# Patient Record
Sex: Female | Born: 1965
Health system: Southern US, Community
[De-identification: ages and names within clinical notes are randomized; demographics above are authoritative.]

## PROBLEM LIST (undated history)

## (undated) DIAGNOSIS — I10 Essential (primary) hypertension: Secondary | ICD-10-CM

## (undated) DIAGNOSIS — Z5189 Encounter for other specified aftercare: Secondary | ICD-10-CM

## (undated) DIAGNOSIS — N83209 Unspecified ovarian cyst, unspecified side: Secondary | ICD-10-CM

## (undated) DIAGNOSIS — D689 Coagulation defect, unspecified: Secondary | ICD-10-CM

## (undated) DIAGNOSIS — R87629 Unspecified abnormal cytological findings in specimens from vagina: Secondary | ICD-10-CM

## (undated) DIAGNOSIS — E119 Type 2 diabetes mellitus without complications: Secondary | ICD-10-CM

## (undated) DIAGNOSIS — I639 Cerebral infarction, unspecified: Secondary | ICD-10-CM

## (undated) HISTORY — DX: Unspecified abnormal cytological findings in specimens from vagina: R87.629

## (undated) HISTORY — DX: Coagulation defect, unspecified: D68.9

## (undated) HISTORY — PX: OVARIAN CYST REMOVAL: SHX89

---

## 2007-03-18 ENCOUNTER — Emergency Department (HOSPITAL_COMMUNITY): Admission: EM | Admit: 2007-03-18 | Discharge: 2007-03-18 | Payer: Self-pay | Admitting: Family Medicine

## 2008-03-26 ENCOUNTER — Other Ambulatory Visit: Payer: Self-pay

## 2008-03-26 ENCOUNTER — Emergency Department: Payer: Self-pay | Admitting: Emergency Medicine

## 2008-03-26 IMAGING — US ABDOMEN ULTRASOUND
1 series · 17 of 25 positions shown · non-contrast
Comparison: none

REASON FOR EXAM: rm 16   upper epigastric pain
COMMENTS:

[Series 1: abdomen ultrasound · 17 of 57 slices shown]
[im 1/57]
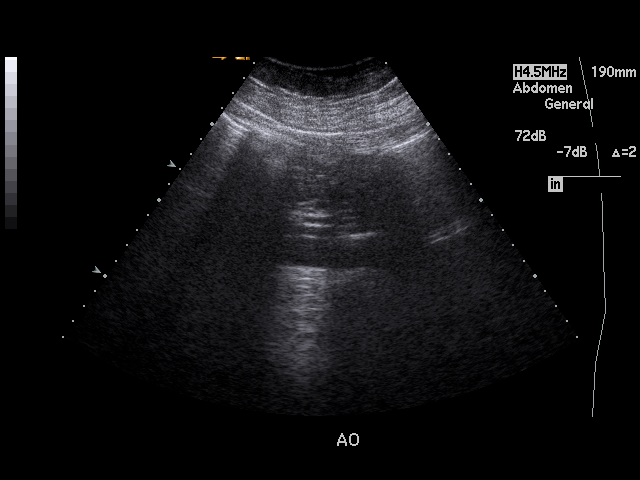
[im 5/57]
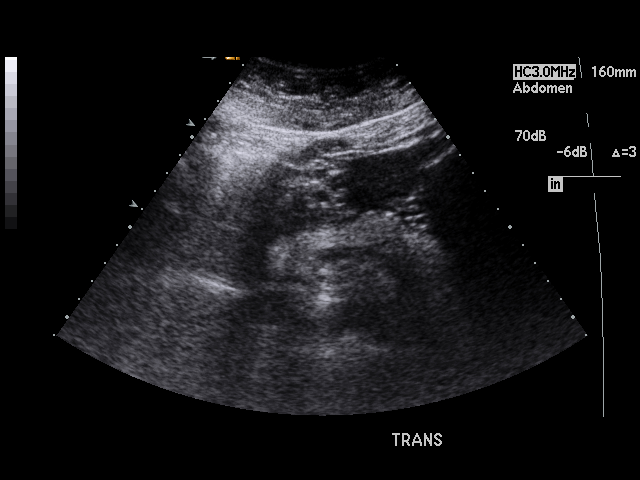
[im 8/57]
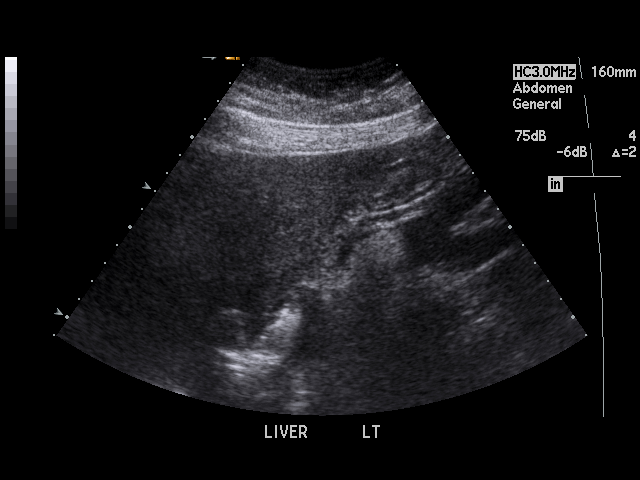
[im 12/57]
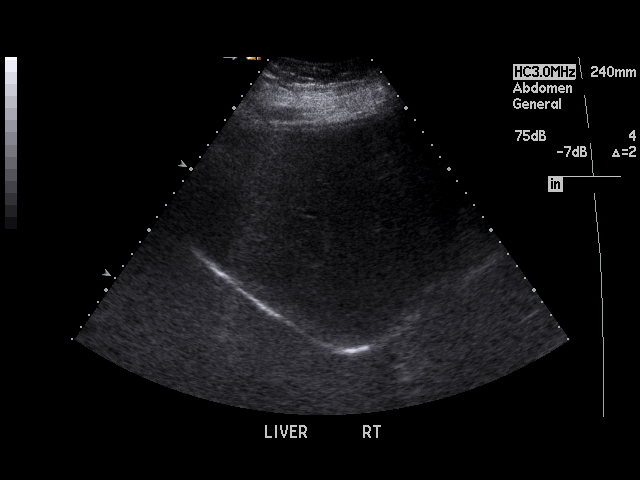
[im 15/57]
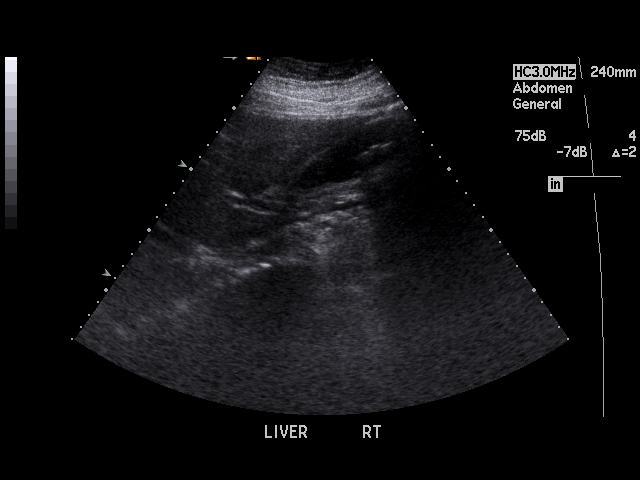
[im 19/57]
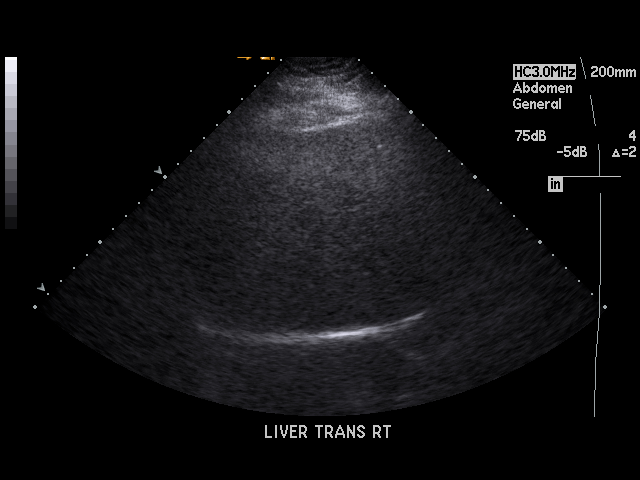
[im 22/57]
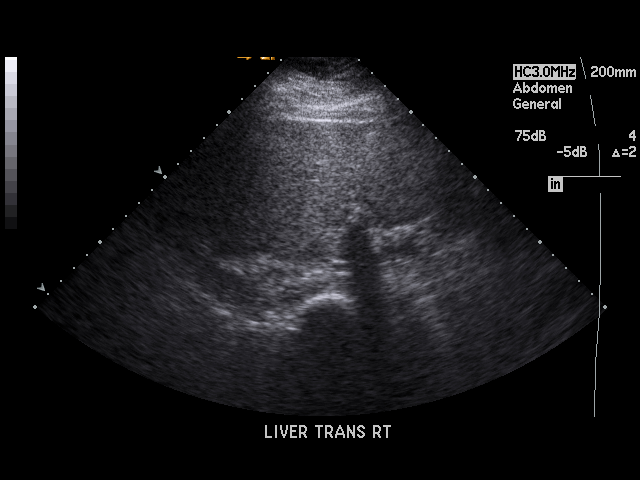
[im 26/57]
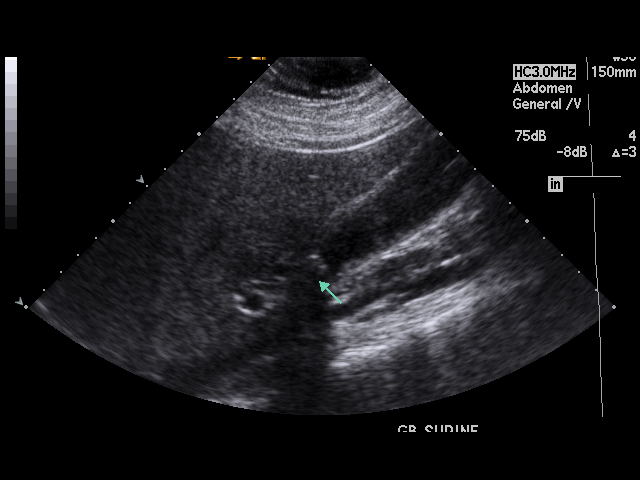
[im 29/57]
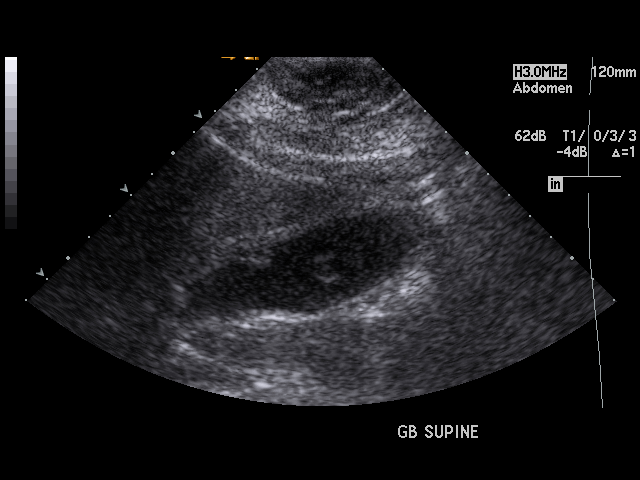
[im 31/57]
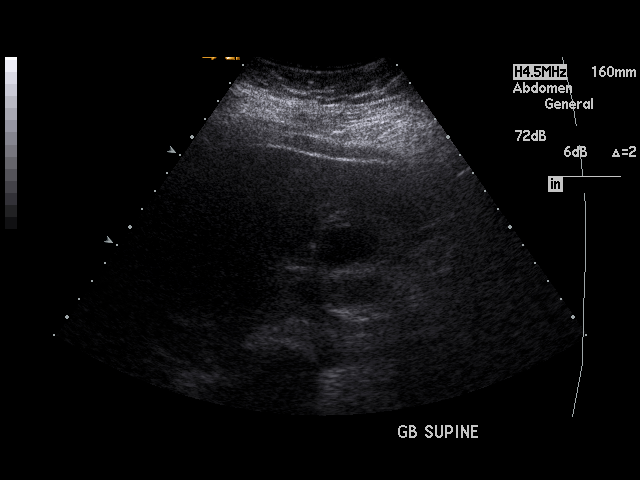
[im 36/57]
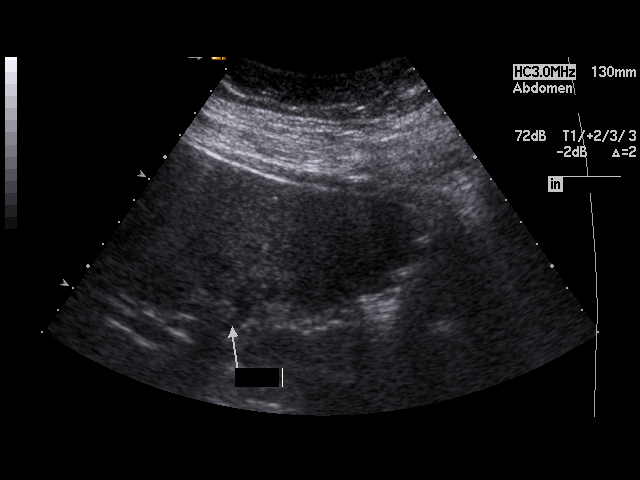
[im 38/57]
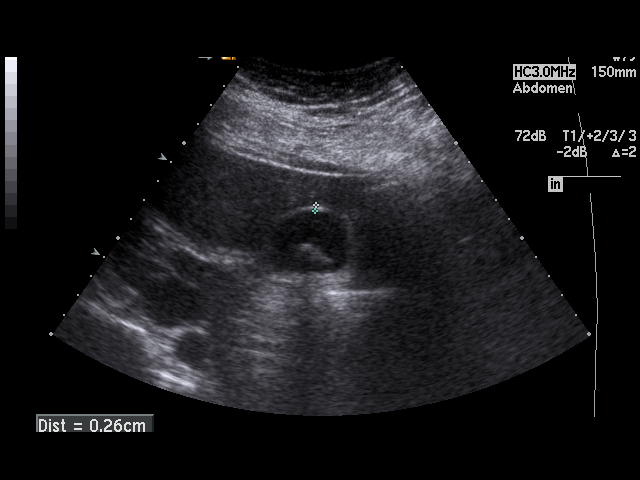
[im 43/57]
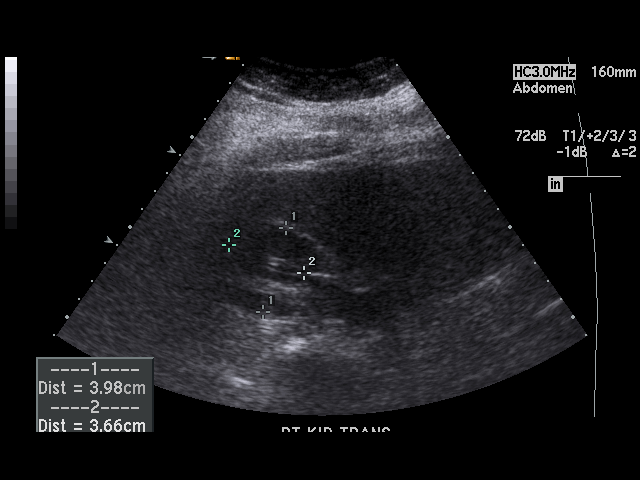
[im 45/57]
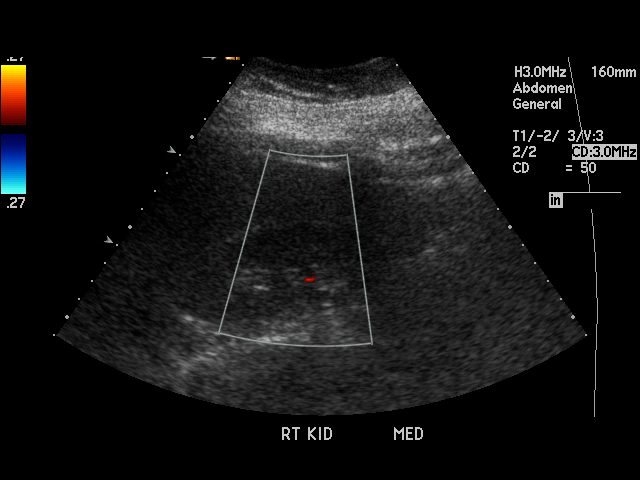
[im 50/57]
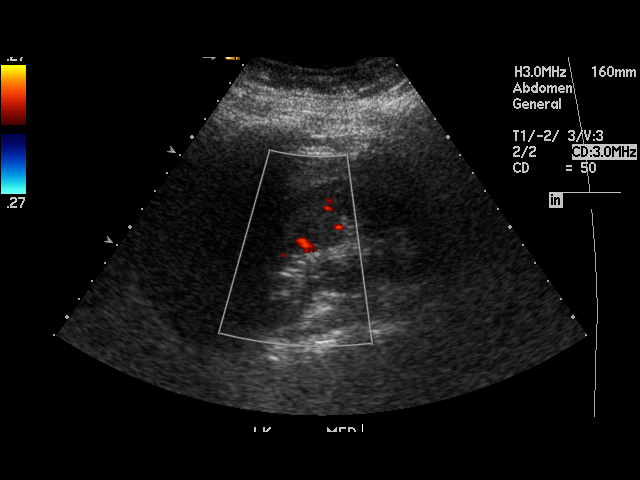
[im 52/57]
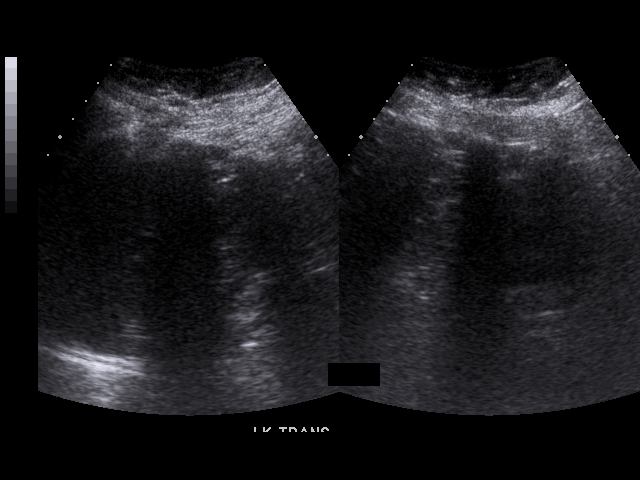
[im 57/57]
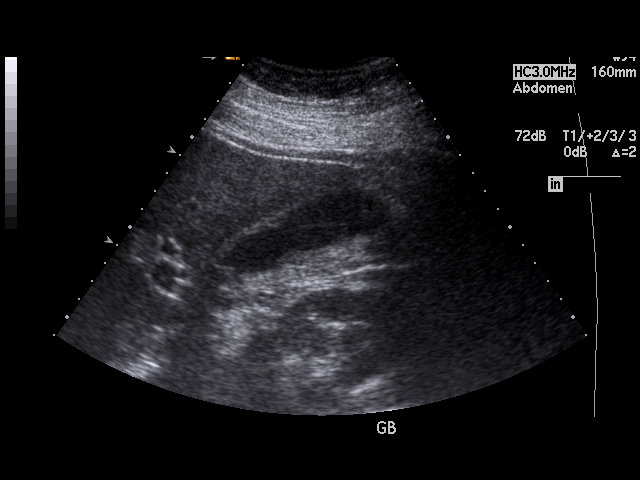

[17 of 25 positions shown; findings below may reference images not displayed]

PROCEDURE:     US  - US ABDOMEN GENERAL SURVEY  - [DATE]  [DATE]

RESULT:     The liver exhibits normal echo texture with no evidence of mass
nor ductal dilation. Portal venous flow is normal in direction toward the
liver. The gallbladder exhibits evidence of stones with a stone lying in the
gallbladder neck. The patient had been medicated and thus an accurate
sonographic Murphy's sign could not be detected. The gallbladder wall is
still within the limits of normal at 2.6 mm. The common bile duct measures
5.8 mm in diameter. The pancreas, spleen, and kidneys are normal in
appearance. Limited evaluation of the abdominal aorta revealed no acute
abnormality.
IMPRESSION: There are findings consistent with multiple gallstones and
sludge with one stone appearing impacted in the gallbladder neck. No
pericholecystic fluid was identified.

A preliminary report was sent to the [HOSPITAL] the conclusion
of the study.

## 2008-04-02 ENCOUNTER — Ambulatory Visit: Payer: Self-pay | Admitting: Vascular Surgery

## 2008-04-02 IMAGING — CR DG CHEST 2V
1 series · 2 of 2 positions shown · non-contrast
Comparison: none

REASON FOR EXAM: htn
COMMENTS:

PROCEDURE:     DXR - DXR CHEST PA (OR AP) AND LATERAL  - [DATE] [DATE]
RESULT:     The lung fields are clear. The heart, mediastinal and osseous
structures show no significant abnormalities.

[Series 1: view not recorded · 0.17mm/px · 2 of 2 slices shown]
[im 1/2]
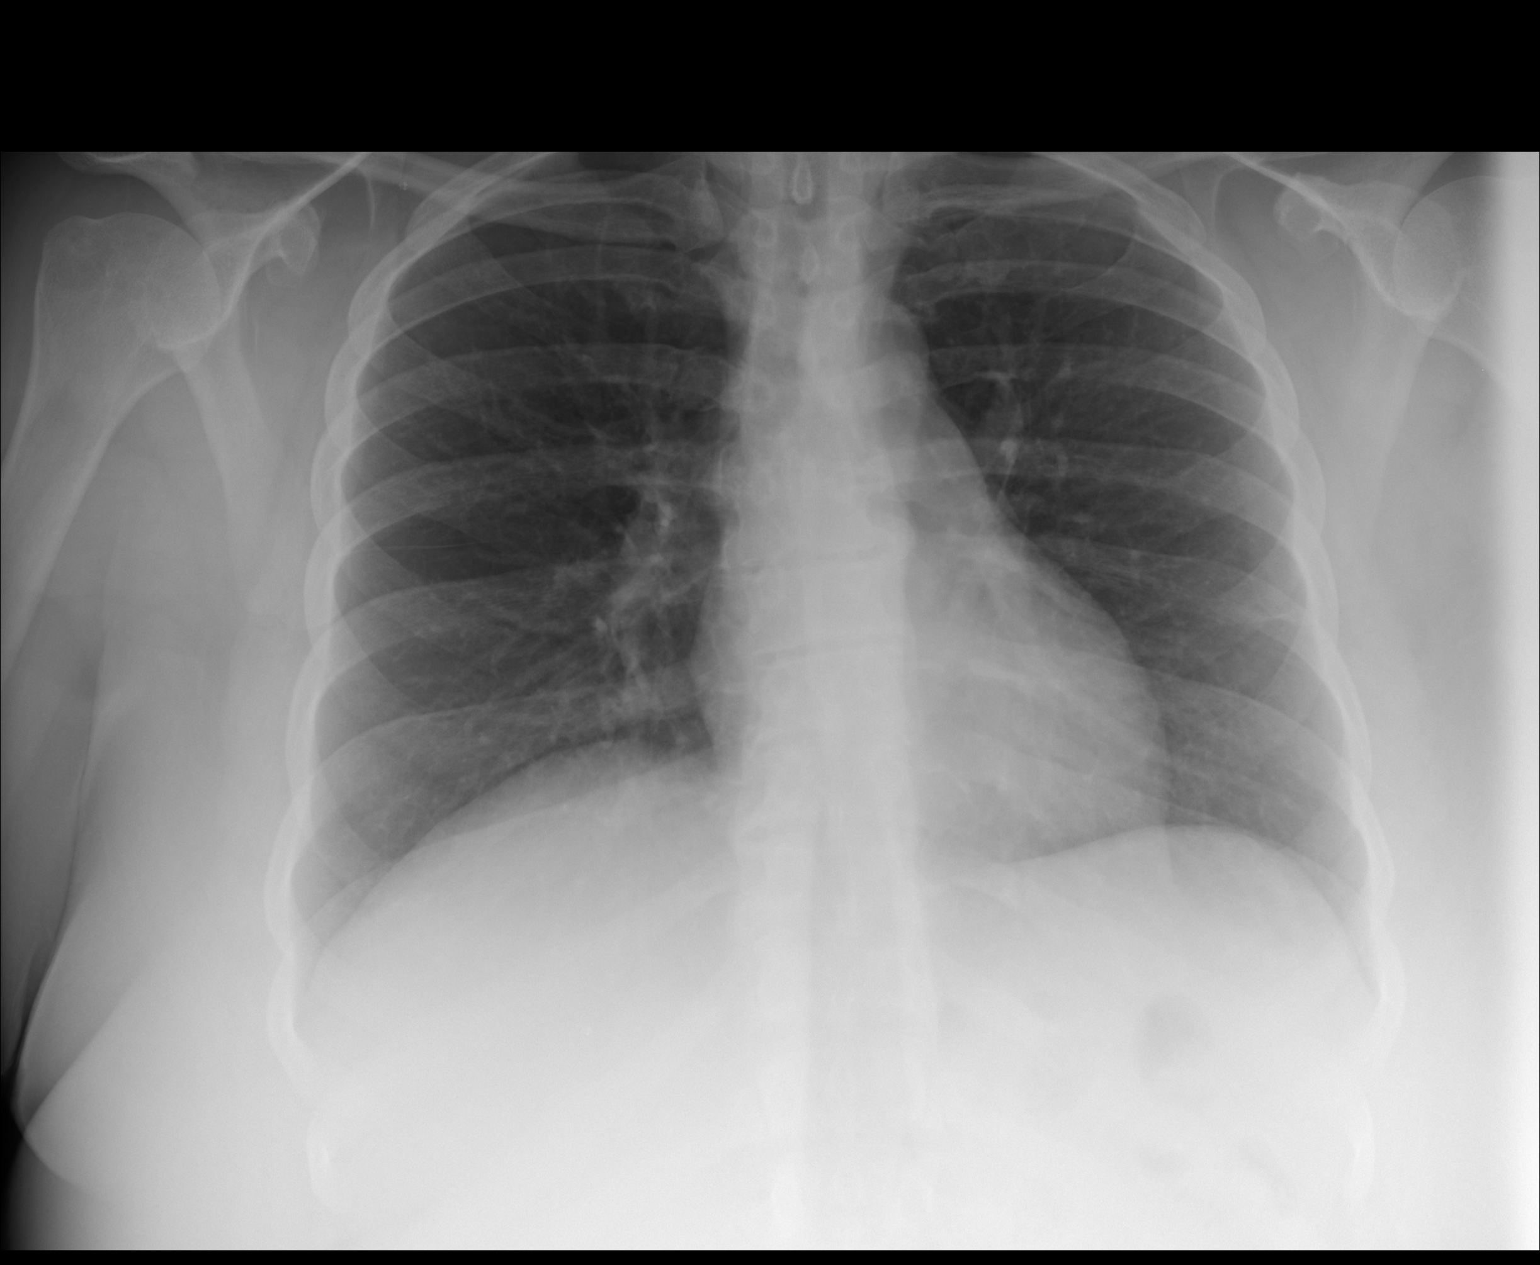
[im 2/2]
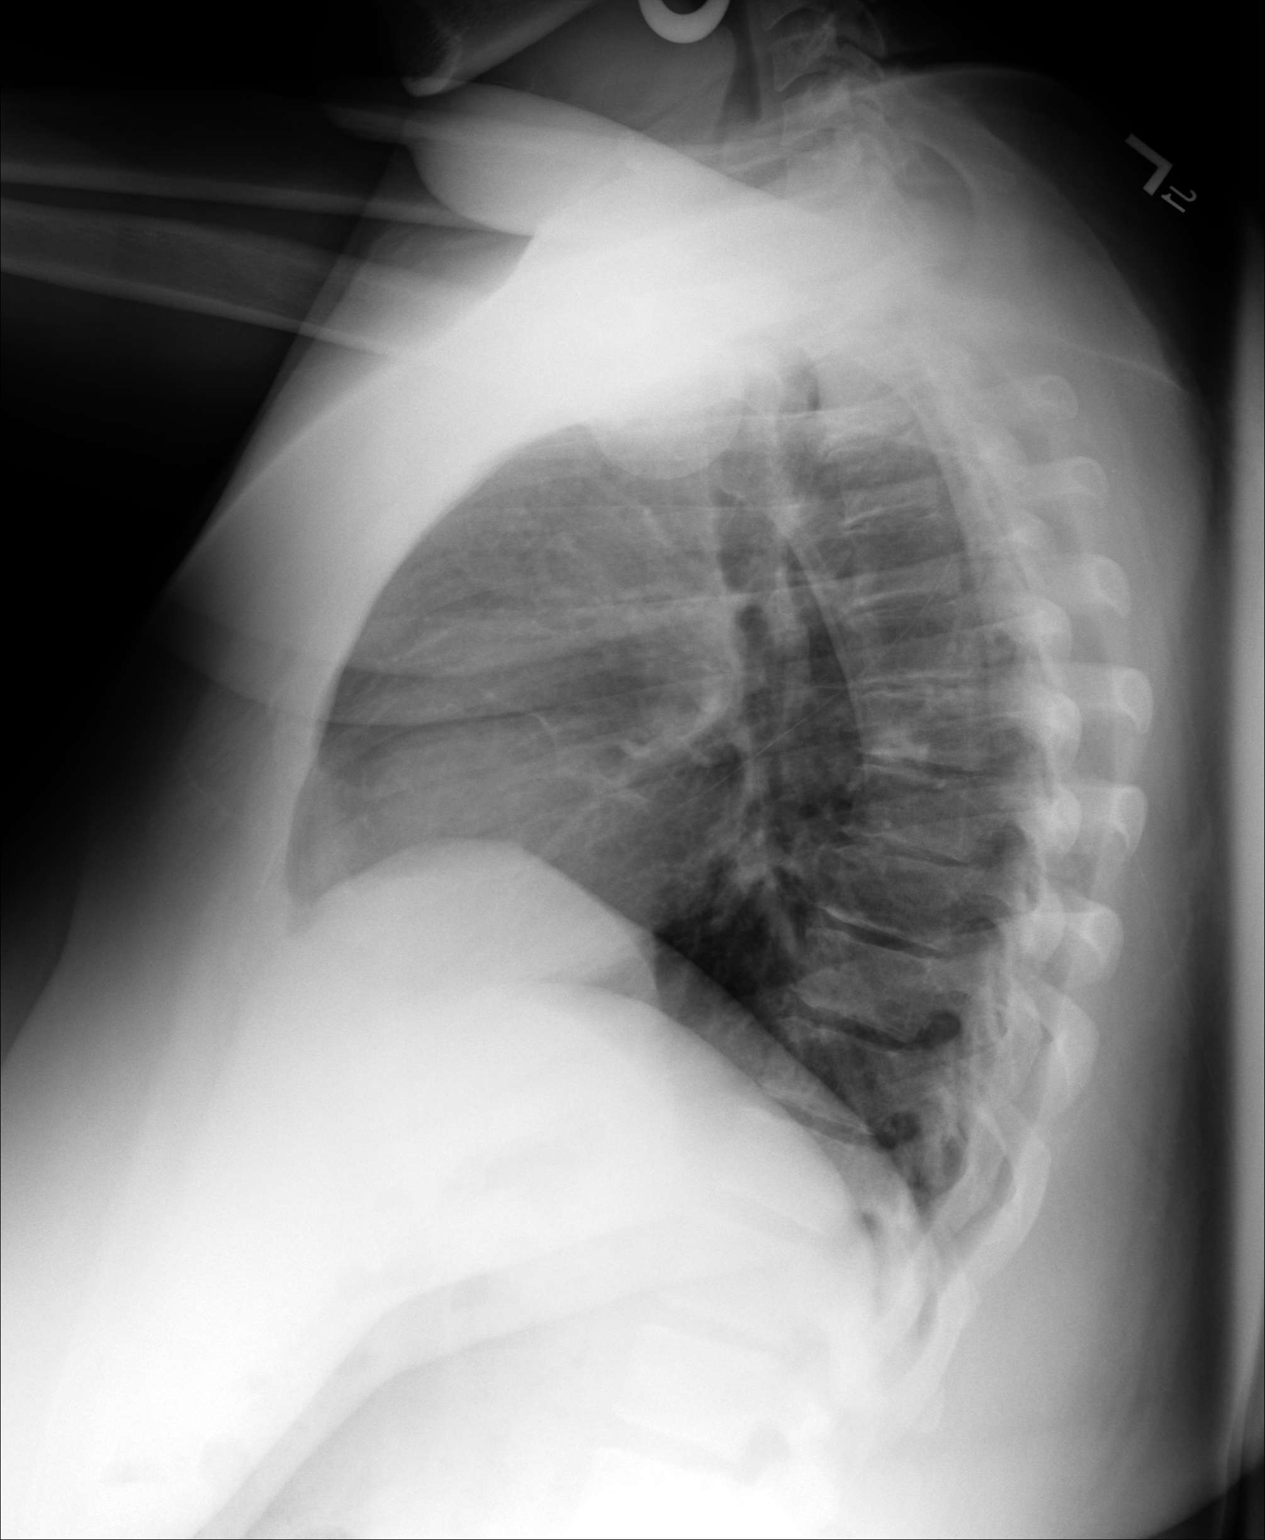

[2 of 2 positions shown; findings below may reference images not displayed]

IMPRESSION: 1.     No significant abnormalities are noted.

## 2008-04-09 ENCOUNTER — Ambulatory Visit: Payer: Self-pay | Admitting: Vascular Surgery

## 2010-02-17 ENCOUNTER — Emergency Department (HOSPITAL_COMMUNITY): Admission: EM | Admit: 2010-02-17 | Discharge: 2010-02-17 | Payer: Self-pay | Admitting: Family Medicine

## 2012-04-16 ENCOUNTER — Ambulatory Visit: Payer: Self-pay | Admitting: Internal Medicine

## 2012-05-23 ENCOUNTER — Emergency Department: Payer: Self-pay | Admitting: *Deleted

## 2012-11-29 ENCOUNTER — Emergency Department: Payer: Self-pay | Admitting: Emergency Medicine

## 2012-11-29 LAB — COMPREHENSIVE METABOLIC PANEL
Alkaline Phosphatase: 74 U/L (ref 50–136)
Anion Gap: 7 (ref 7–16)
BUN: 10 mg/dL (ref 7–18)
Bilirubin,Total: 0.4 mg/dL (ref 0.2–1.0)
Chloride: 105 mmol/L (ref 98–107)
Co2: 28 mmol/L (ref 21–32)
Glucose: 106 mg/dL — ABNORMAL HIGH (ref 65–99)
Osmolality: 279 (ref 275–301)
Total Protein: 8.4 g/dL — ABNORMAL HIGH (ref 6.4–8.2)

## 2012-11-29 LAB — CBC WITH DIFFERENTIAL/PLATELET
Basophil #: 0.1 10*3/uL (ref 0.0–0.1)
Eosinophil #: 0.1 10*3/uL (ref 0.0–0.7)
Eosinophil %: 1.6 %
HCT: 37.5 % (ref 35.0–47.0)
HGB: 12.5 g/dL (ref 12.0–16.0)
Lymphocyte #: 2.1 10*3/uL (ref 1.0–3.6)
Lymphocyte %: 40.2 %
MCHC: 33.3 g/dL (ref 32.0–36.0)
MCV: 70 fL — ABNORMAL LOW (ref 80–100)
Monocyte #: 0.3 x10 3/mm (ref 0.2–0.9)
Neutrophil #: 2.7 10*3/uL (ref 1.4–6.5)
RBC: 5.38 10*6/uL — ABNORMAL HIGH (ref 3.80–5.20)
RDW: 16.7 % — ABNORMAL HIGH (ref 11.5–14.5)

## 2012-11-29 LAB — PREGNANCY, URINE: Pregnancy Test, Urine: NEGATIVE m[IU]/mL

## 2012-11-29 LAB — URINALYSIS, COMPLETE
Bilirubin,UR: NEGATIVE
Blood: NEGATIVE
Glucose,UR: NEGATIVE mg/dL (ref 0–75)
Hyaline Cast: 1
Ketone: NEGATIVE
Ph: 5 (ref 4.5–8.0)
Specific Gravity: 1.015 (ref 1.003–1.030)
Squamous Epithelial: 11

## 2012-11-29 LAB — LIPASE, BLOOD: Lipase: 81 U/L (ref 73–393)

## 2012-11-29 IMAGING — CT CT ABD-PELV W/ CM
1 of 3 series · 13 of 32 positions shown, 19 images · non-contrast
Comparison: none

REASON FOR EXAM: (1) RUQ/RLQ pain and vomiting, h/o cholecystectomy; (2)
RUQ/RLQ pain and vomitin
COMMENTS:

PROCEDURE:     CT  - CT ABDOMEN / PELVIS  W  - [DATE]  [DATE]
RESULT:     History: Pain.
Comparison Study: No recent.

[Series 2: 3mm soft tissue · axial · 0.74mm/px · z∈[-1042,-634]mm · 13 of 158 slices shown, 19 images]
[im 11/158  soft-tissue]
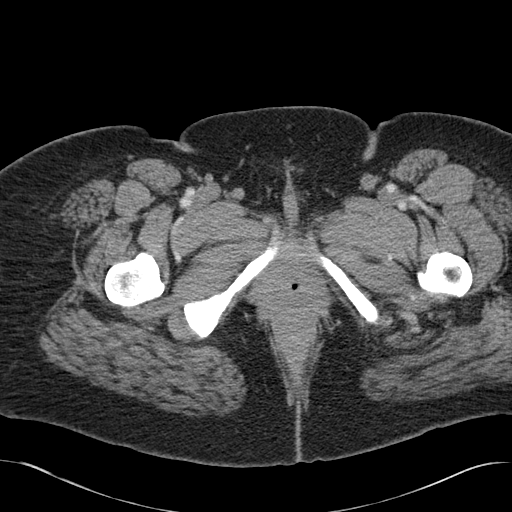
[im 11/158  bone]
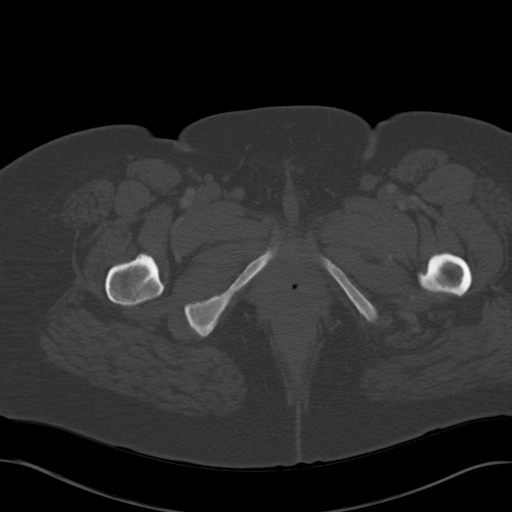
[im 21/158  soft-tissue]
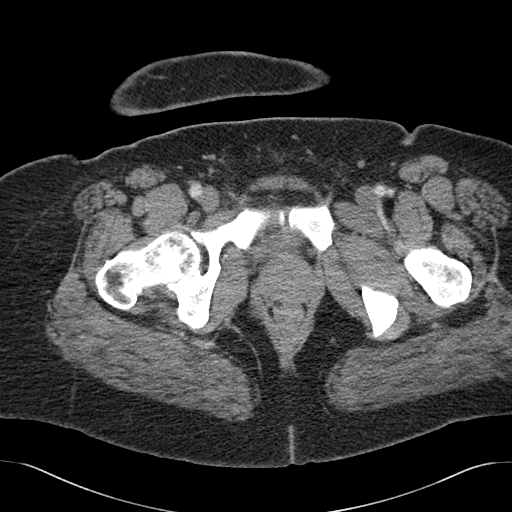
[im 32/158  soft-tissue]
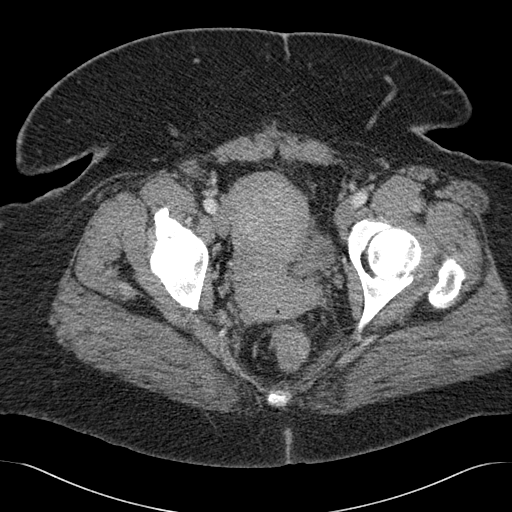
[im 42/158  soft-tissue]
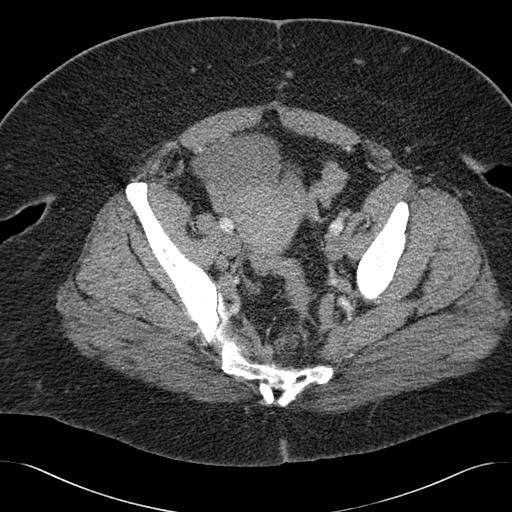
[im 53/158  soft-tissue]
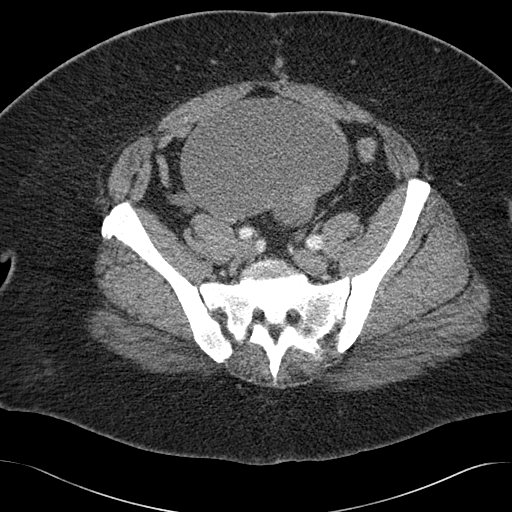
[im 63/158  soft-tissue]
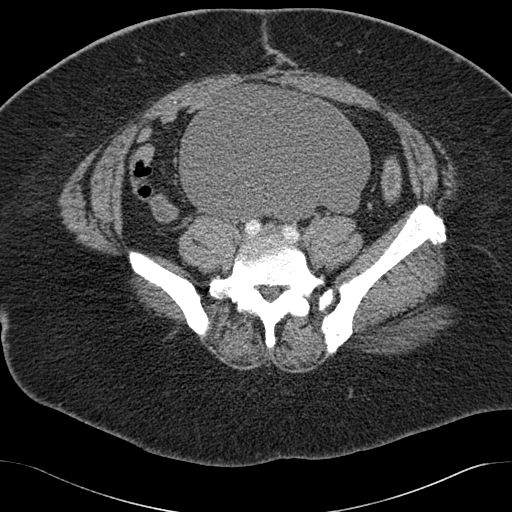
[im 84/158  soft-tissue]
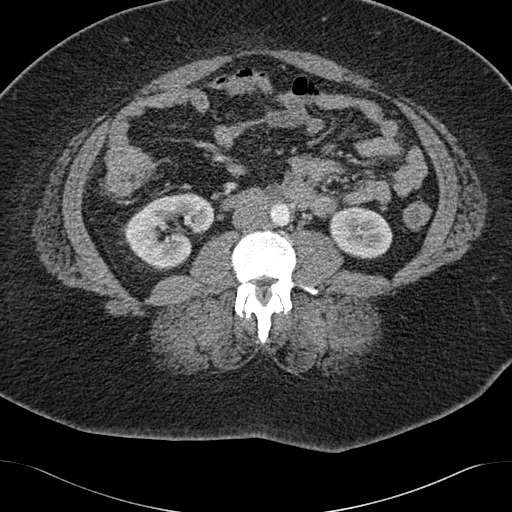
[im 95/158  soft-tissue]
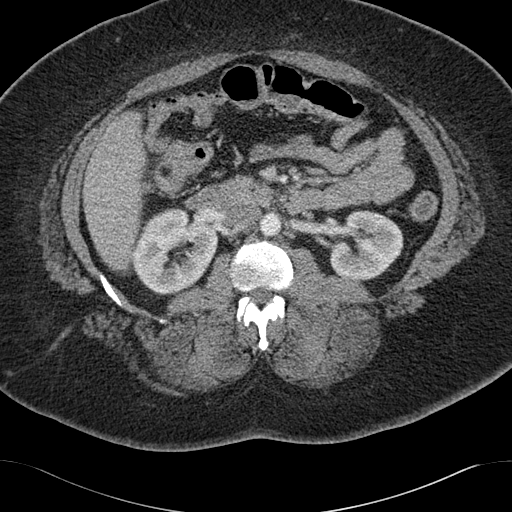
[im 105/158  soft-tissue]
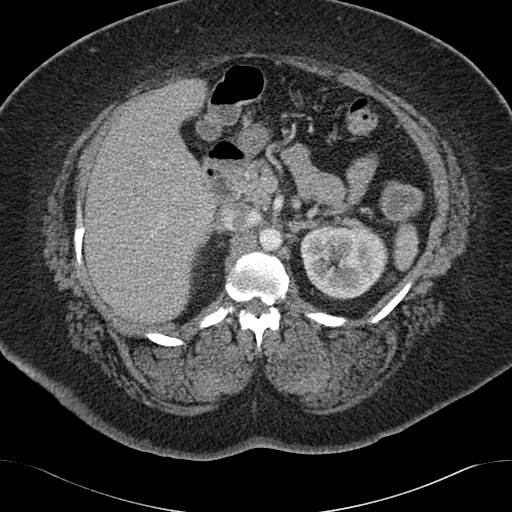
[im 105/158  bone]
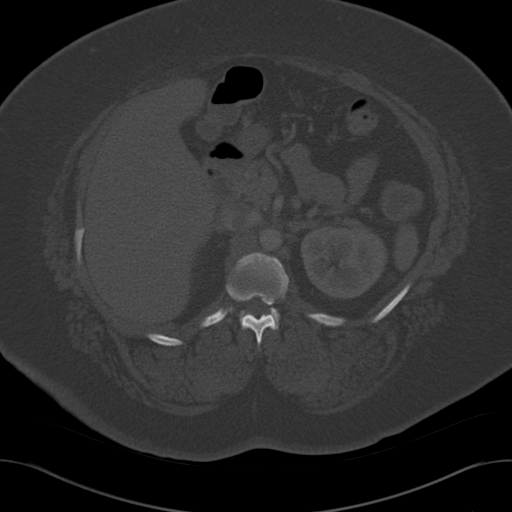
[im 116/158  soft-tissue]
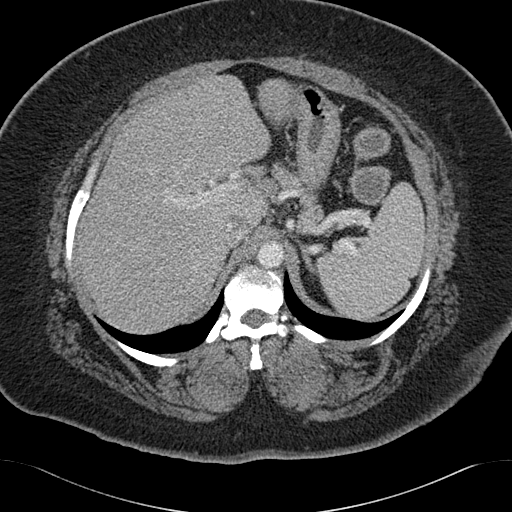
[im 116/158  lung]
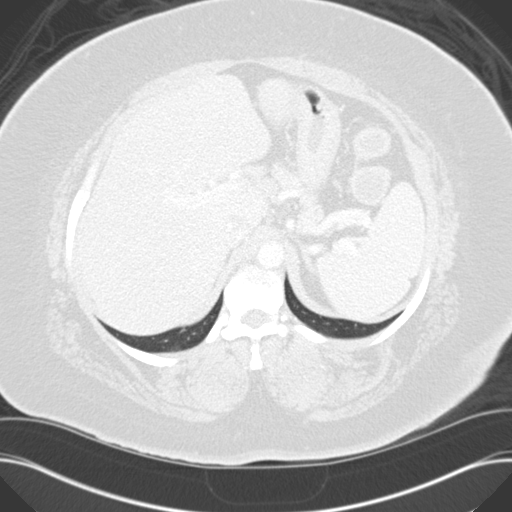
[im 126/158  soft-tissue]
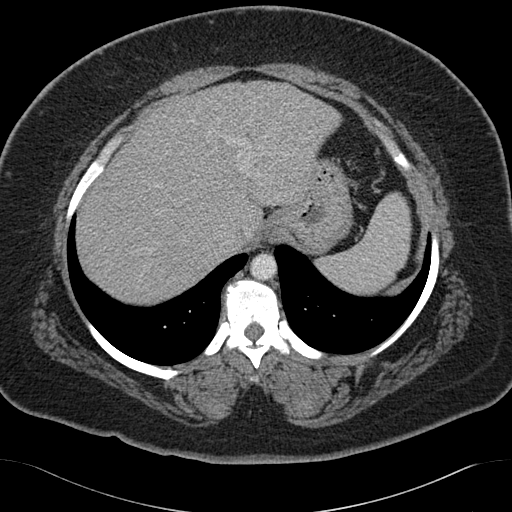
[im 126/158  lung]
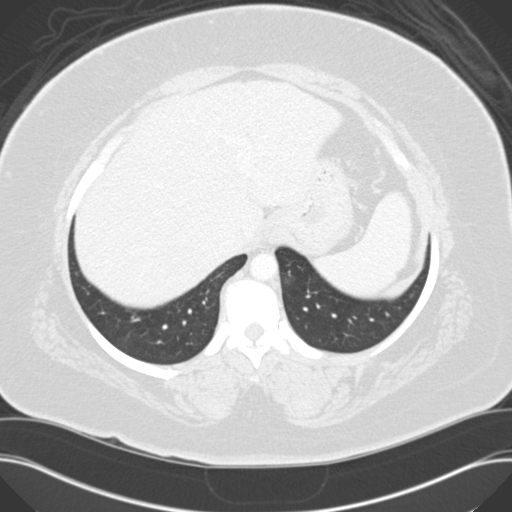
[im 137/158  soft-tissue]
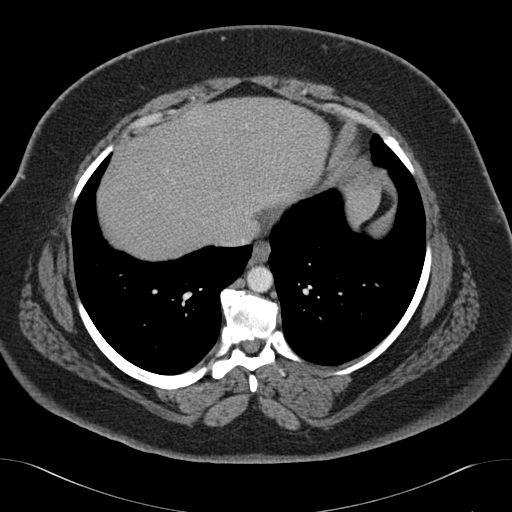
[im 137/158  lung]
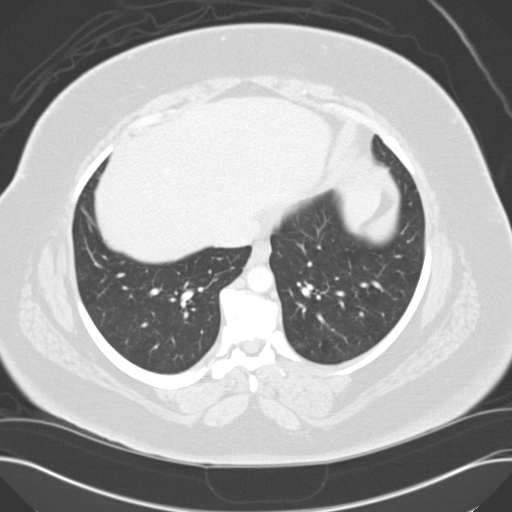
[im 147/158  soft-tissue]
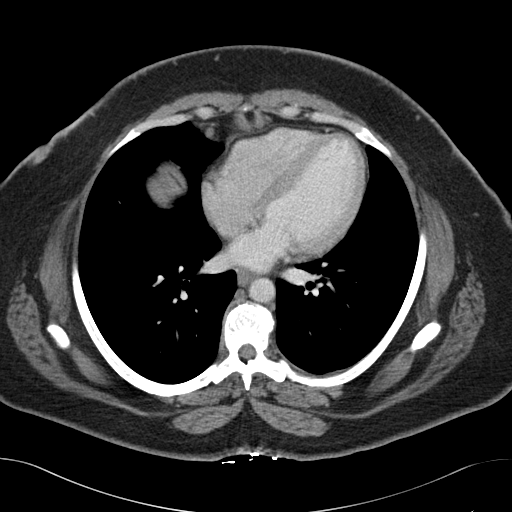
[im 147/158  lung]
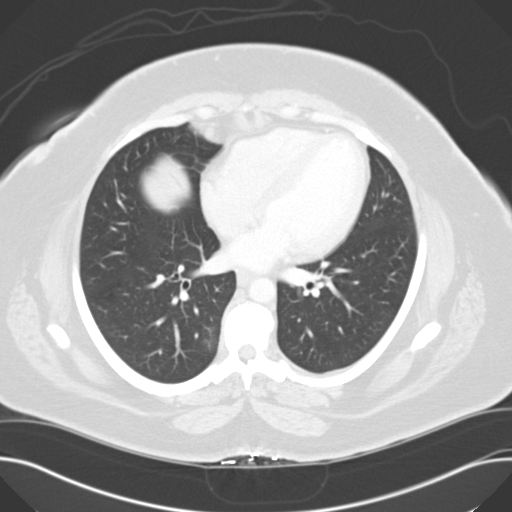

[13 of 32 positions shown; findings below may reference images not displayed]

FINDINGS: Standard CT obtained under cc of [L1]. Liver normal. Spleen
normal. Prior cholecystectomy. Pancreas normal. Adrenals normal. Kidneys
normal. Aorta normal caliber. No inflammatory changes no the right or left
lower quadrant. Bladder is nondistended. No evidence of hydronephrosis.
There is a large 11.6 cm cystic mass with nodular components in the
periphery noted in the pelvis. Cystic tumor could present this fashion in
consultation with the gynecology is suggested. Small inguinal lymph nodes
are noted.
IMPRESSION: Large complex cystic mass in the pelvis for which
gynecologic consultation suggested as a cystic tumor of gynecologic origin
could present in this fashion. Etiologies of cystic masses including abscess
cannot be excluded.

## 2012-11-30 ENCOUNTER — Emergency Department: Payer: Self-pay | Admitting: Emergency Medicine

## 2012-12-13 ENCOUNTER — Emergency Department: Payer: Self-pay | Admitting: Emergency Medicine

## 2012-12-13 LAB — URINALYSIS, COMPLETE
Bacteria: NONE SEEN
Bilirubin,UR: NEGATIVE
Blood: NEGATIVE
Ketone: NEGATIVE
Protein: NEGATIVE
Squamous Epithelial: 1
WBC UR: NONE SEEN /HPF (ref 0–5)

## 2012-12-13 LAB — COMPREHENSIVE METABOLIC PANEL
Albumin: 3.8 g/dL (ref 3.4–5.0)
Anion Gap: 6 — ABNORMAL LOW (ref 7–16)
Calcium, Total: 8.8 mg/dL (ref 8.5–10.1)
Chloride: 105 mmol/L (ref 98–107)
EGFR (African American): >60
EGFR (Non-African Amer.): >60
Glucose: 113 mg/dL — ABNORMAL HIGH (ref 65–99)
Potassium: 3.9 mmol/L (ref 3.5–5.1)
SGOT(AST): 30 U/L (ref 15–37)
SGPT (ALT): 31 U/L (ref 12–78)
Sodium: 138 mmol/L (ref 136–145)

## 2012-12-13 LAB — CBC
Platelet: 256 10*3/uL (ref 150–440)
RDW: 17.1 % — ABNORMAL HIGH (ref 11.5–14.5)
WBC: 5.2 10*3/uL (ref 3.6–11.0)

## 2012-12-13 LAB — LIPASE, BLOOD: Lipase: 77 U/L (ref 73–393)

## 2013-01-23 ENCOUNTER — Emergency Department (HOSPITAL_COMMUNITY): Payer: Medicaid Other

## 2013-01-23 ENCOUNTER — Inpatient Hospital Stay (HOSPITAL_COMMUNITY)
Admission: EM | Admit: 2013-01-23 | Discharge: 2013-01-29 | DRG: 065 | Disposition: A | Discharge status: Home / Self Care | Payer: Medicaid Other | Attending: Internal Medicine | Admitting: Internal Medicine

## 2013-01-23 ENCOUNTER — Encounter (HOSPITAL_COMMUNITY): Payer: Self-pay | Admitting: Adult Health

## 2013-01-23 DIAGNOSIS — D6859 Other primary thrombophilia: Secondary | ICD-10-CM | POA: Diagnosis present

## 2013-01-23 DIAGNOSIS — I824Z9 Acute embolism and thrombosis of unspecified deep veins of unspecified distal lower extremity: Secondary | ICD-10-CM | POA: Diagnosis present

## 2013-01-23 DIAGNOSIS — Z6841 Body Mass Index (BMI) 40.0 and over, adult: Secondary | ICD-10-CM

## 2013-01-23 DIAGNOSIS — R4701 Aphasia: Secondary | ICD-10-CM

## 2013-01-23 DIAGNOSIS — I10 Essential (primary) hypertension: Secondary | ICD-10-CM | POA: Diagnosis present

## 2013-01-23 DIAGNOSIS — I6350 Cerebral infarction due to unspecified occlusion or stenosis of unspecified cerebral artery: Principal | ICD-10-CM | POA: Diagnosis present

## 2013-01-23 DIAGNOSIS — I700 Atherosclerosis of aorta: Secondary | ICD-10-CM | POA: Diagnosis present

## 2013-01-23 DIAGNOSIS — Z8673 Personal history of transient ischemic attack (TIA), and cerebral infarction without residual deficits: Secondary | ICD-10-CM | POA: Diagnosis present

## 2013-01-23 DIAGNOSIS — I639 Cerebral infarction, unspecified: Secondary | ICD-10-CM

## 2013-01-23 DIAGNOSIS — F40298 Other specified phobia: Secondary | ICD-10-CM | POA: Diagnosis present

## 2013-01-23 HISTORY — DX: Unspecified ovarian cyst, unspecified side: N83.209

## 2013-01-23 HISTORY — DX: Essential (primary) hypertension: I10

## 2013-01-23 LAB — CBC WITH DIFFERENTIAL/PLATELET
Basophils Relative: 0 % (ref 0–1)
Eosinophils Relative: 8 % — ABNORMAL HIGH (ref 0–5)
HCT: 38.6 % (ref 36.0–46.0)
Hemoglobin: 13.1 g/dL (ref 12.0–15.0)
Lymphs Abs: 1.7 10*3/uL (ref 0.7–4.0)
MCH: 23.7 pg — ABNORMAL LOW (ref 26.0–34.0)
MCV: 69.9 fL — ABNORMAL LOW (ref 78.0–100.0)
Monocytes Absolute: 0.4 10*3/uL (ref 0.1–1.0)
Neutro Abs: 4.1 10*3/uL (ref 1.7–7.7)
Platelets: 291 10*3/uL (ref 150–400)
RBC: 5.52 MIL/uL — ABNORMAL HIGH (ref 3.87–5.11)

## 2013-01-23 LAB — BASIC METABOLIC PANEL
BUN: 11 mg/dL (ref 6–23)
CO2: 26 mEq/L (ref 19–32)
Calcium: 9 mg/dL (ref 8.4–10.5)
Creatinine, Ser: 0.79 mg/dL (ref 0.50–1.10)
Glucose, Bld: 108 mg/dL — ABNORMAL HIGH (ref 70–99)

## 2013-01-23 IMAGING — CR DG CHEST 2V
2 series · 2 of 2 positions shown · non-contrast
Comparison: None.

CLINICAL DATA: Memory loss.  Altered mental status.

CHEST - 2 VIEW

[w chest pa]
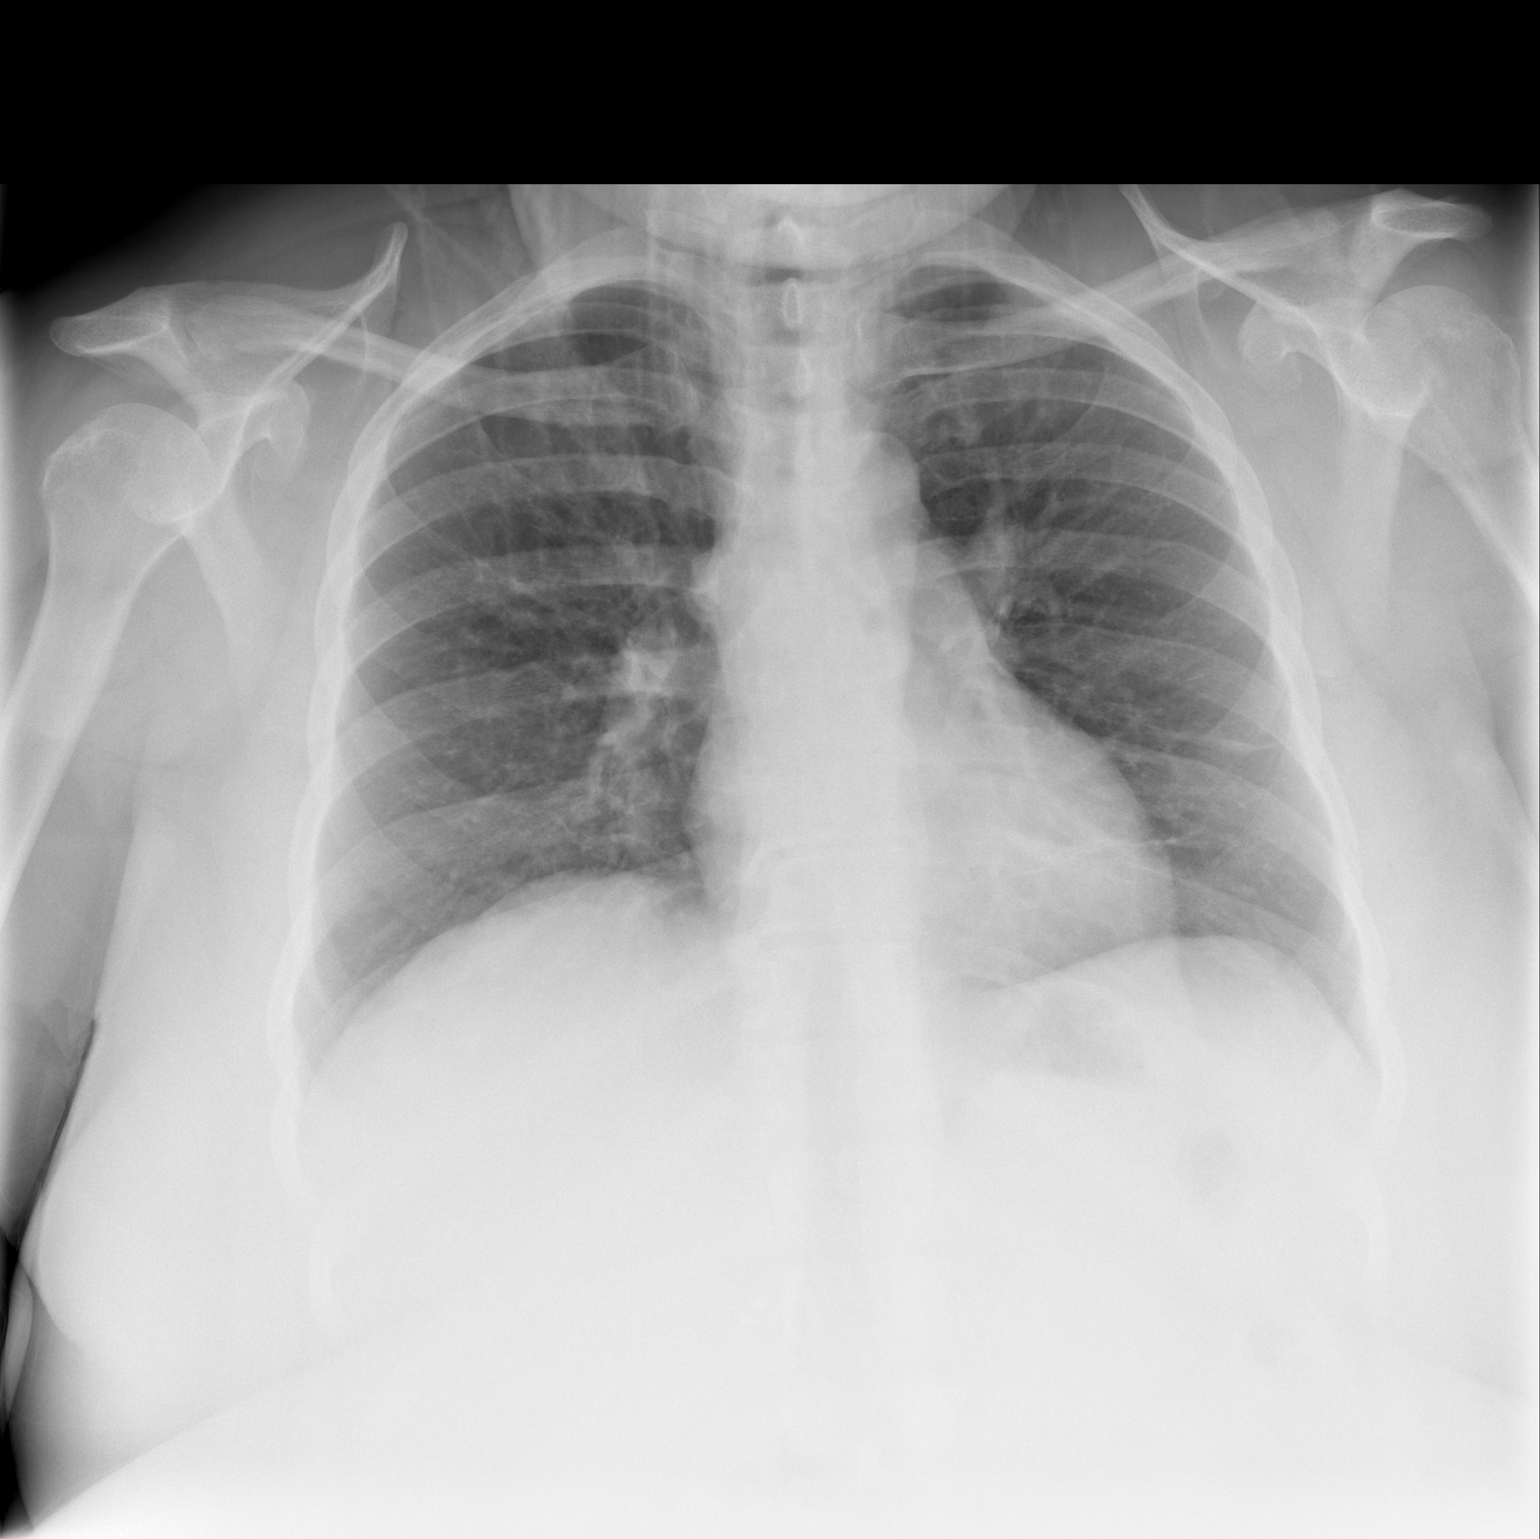

[w chest lat]
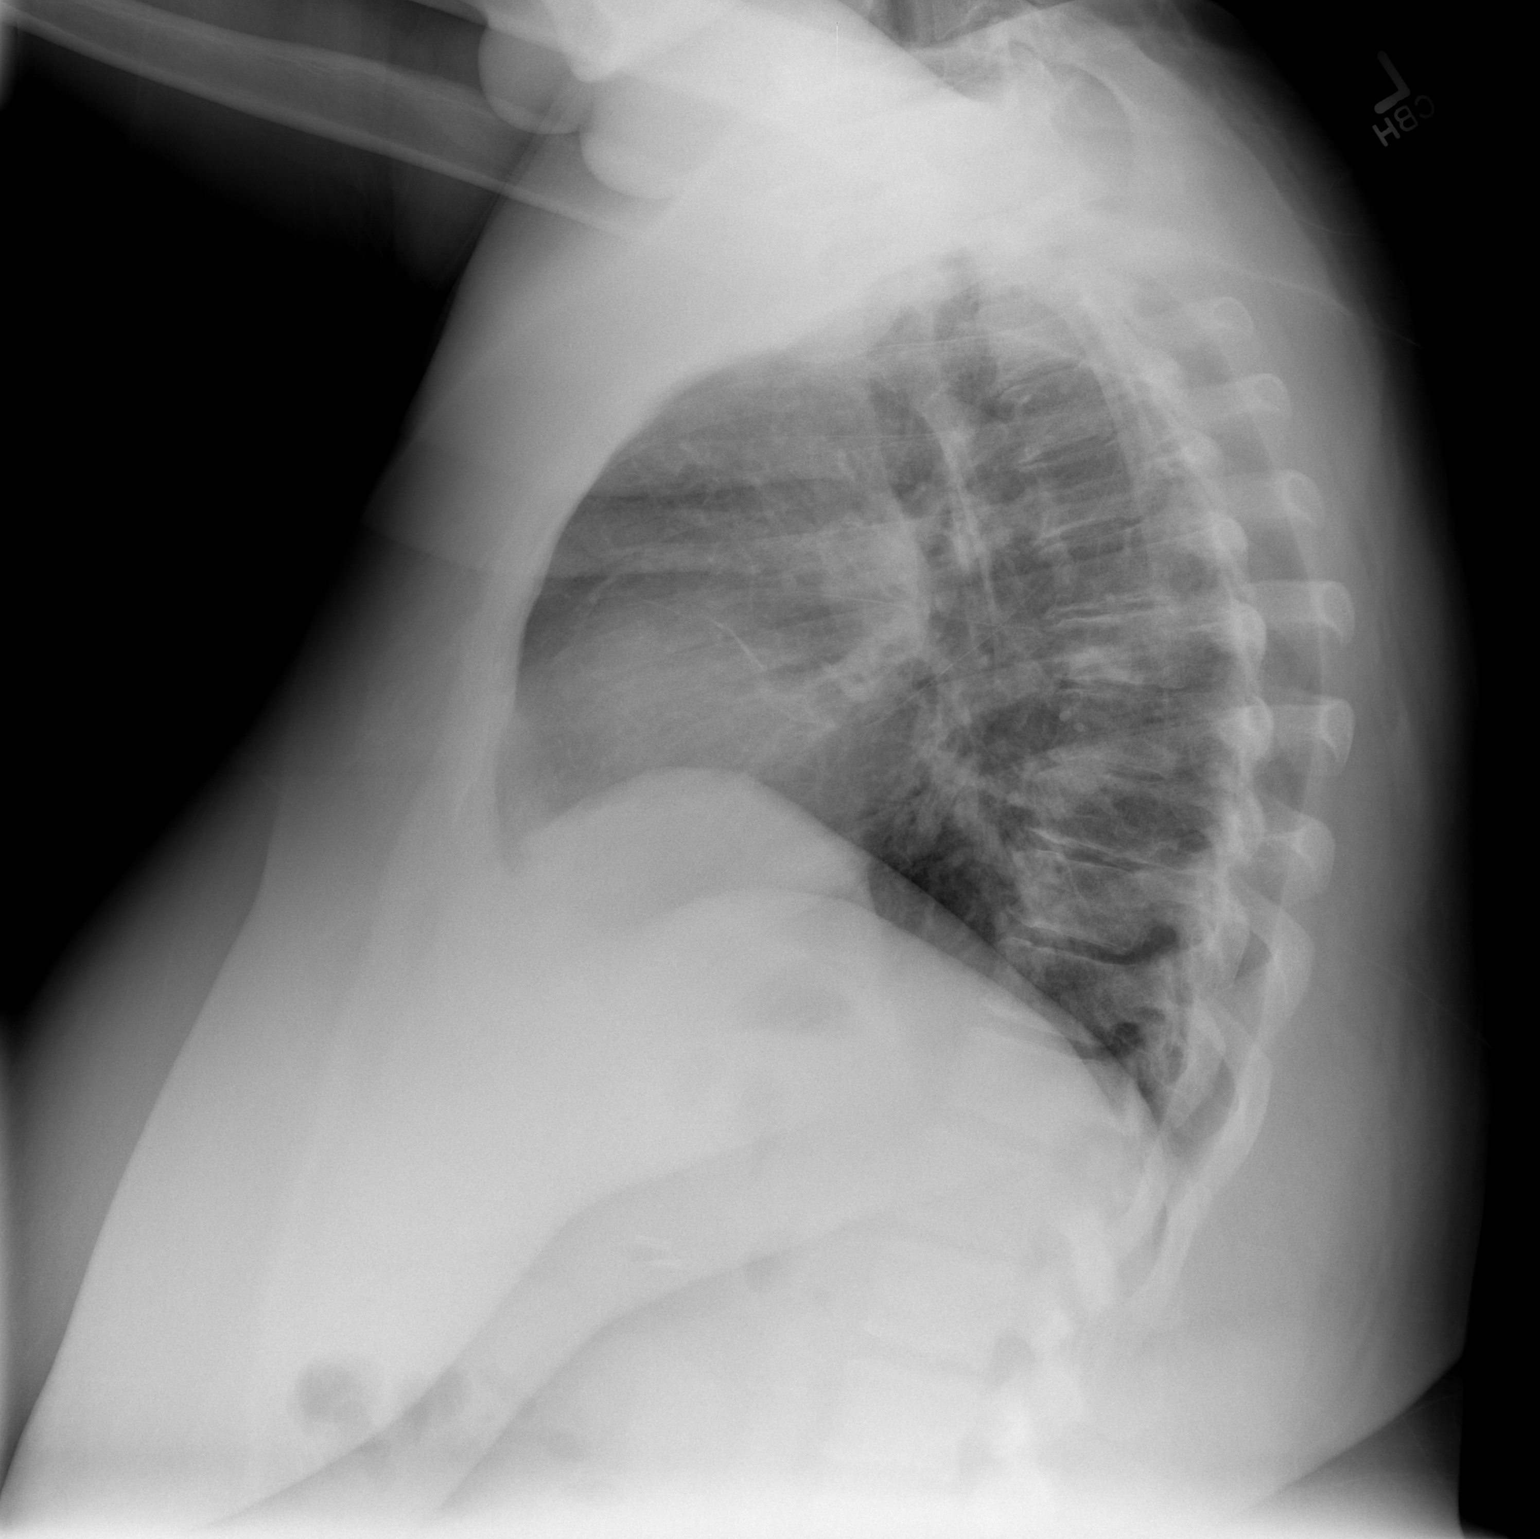

[2 of 2 positions shown; findings below may reference images not displayed]

FINDINGS: Mild lingular scarring is noted.  Lungs otherwise clear.
No evidence of pleural effusion.  Heart size and mediastinal
contours are normal.
IMPRESSION: No active disease.

## 2013-01-23 IMAGING — CT CT HEAD W/O CM
1 of 2 series · 13 of 30 positions shown, 17 images · non-contrast
Comparison: None.

CLINICAL DATA: Amnesia and memory loss.  Postop from pelvic surgery
several days ago..

CT HEAD WITHOUT CONTRAST
TECHNIQUE: Contiguous axial images were obtained from the base of
the skull through the vertex without contrast.

[Series 2: brain · axial · 0.47mm/px · z∈[+117,+247]mm · 13 of 32 slices shown, 17 images]
[im 3/32  brain]
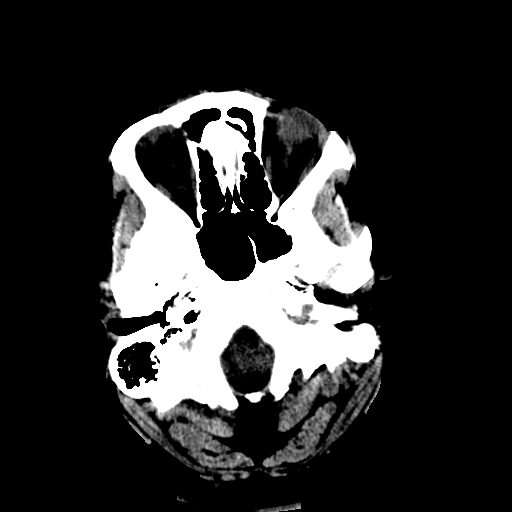
[im 3/32  bone]
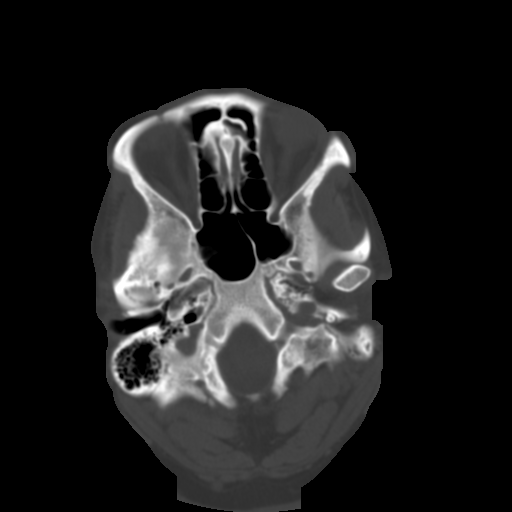
[im 5/32  brain]
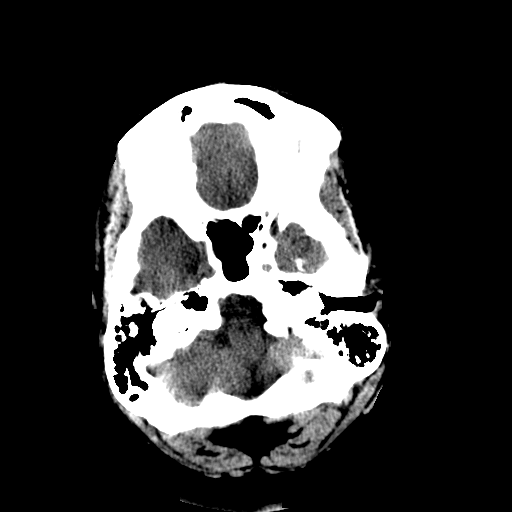
[im 7/32  brain]
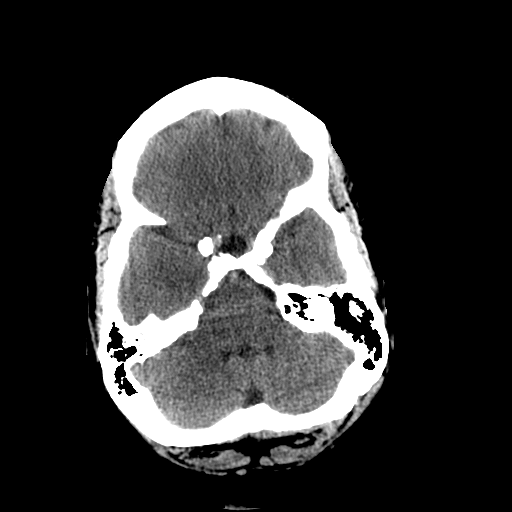
[im 9/32  brain]
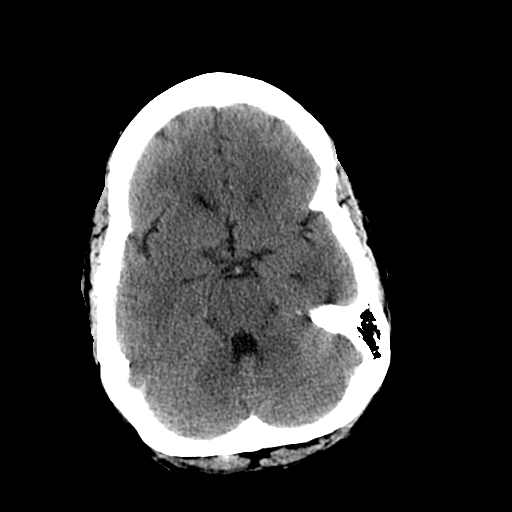
[im 12/32  brain]
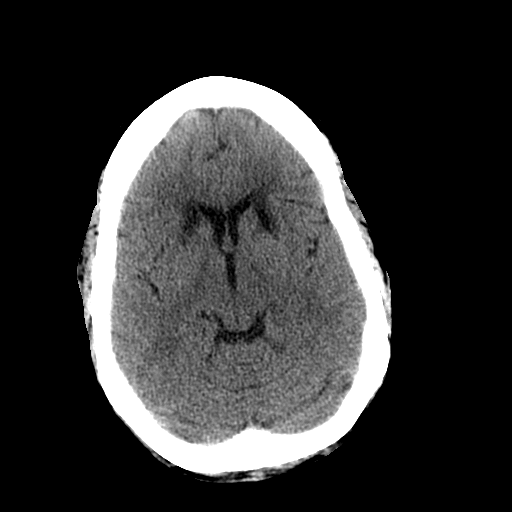
[im 12/32  bone]
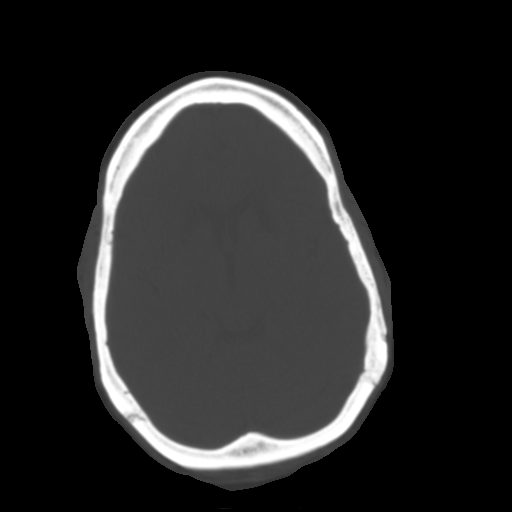
[im 14/32  brain]
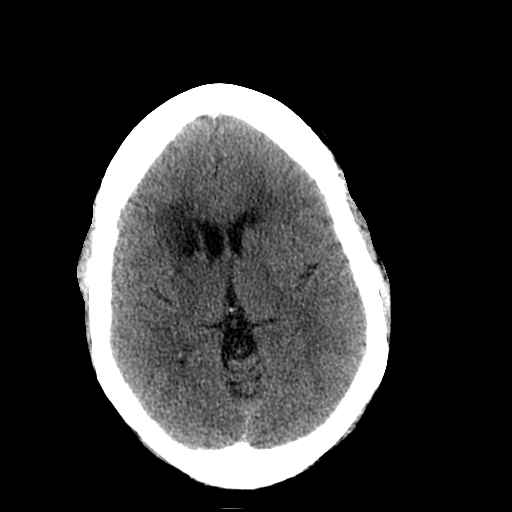
[im 16/32  brain]
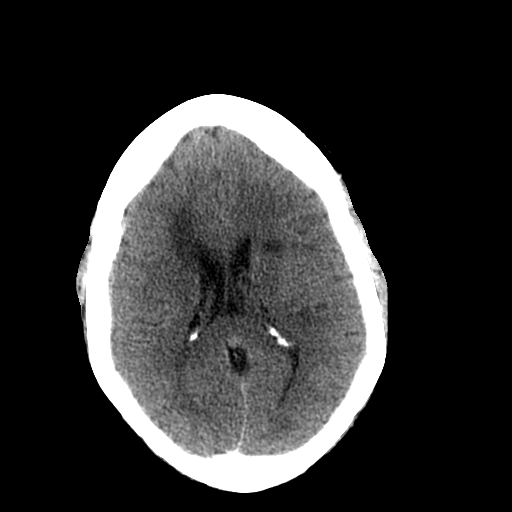
[im 18/32  brain]
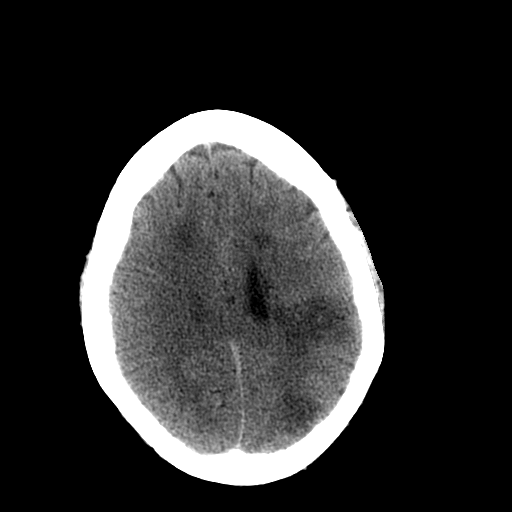
[im 20/32  brain]
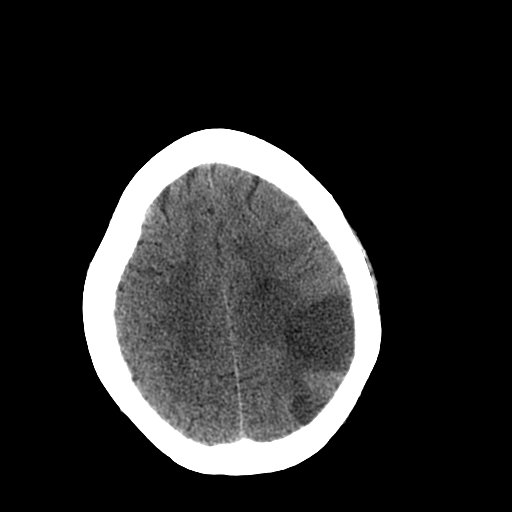
[im 20/32  bone]
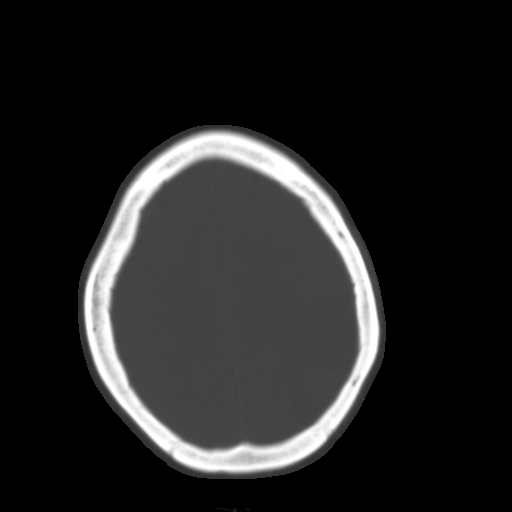
[im 23/32  brain]
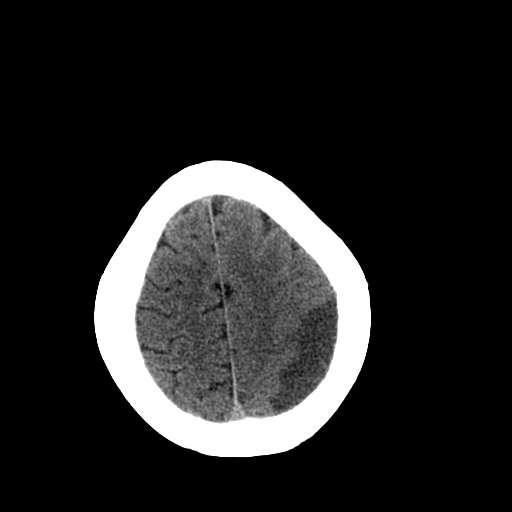
[im 25/32  brain]
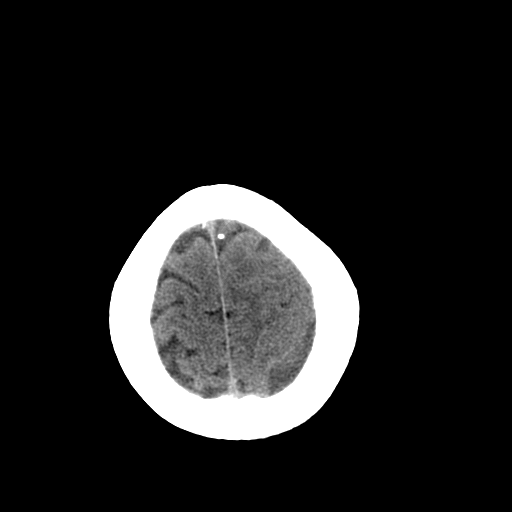
[im 27/32  brain]
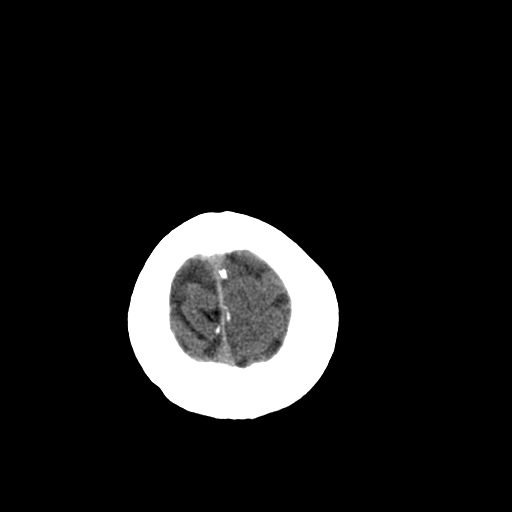
[im 29/32  brain]
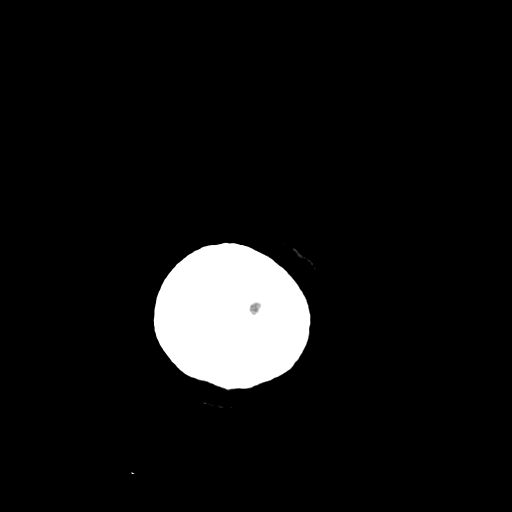
[im 29/32  bone]
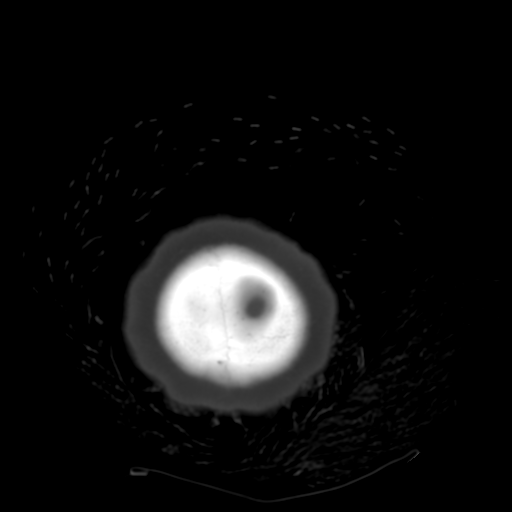

[13 of 30 positions shown; findings below may reference images not displayed]

FINDINGS: A well circumscribed area of cytotoxic edema is seen in
the left parietal lobe, consistent with early subacute infarct.
There is no evidence of hemorrhage, mass effect, or midline shift.

No other definite areas of acute to subacute cerebral infarction
identified.  Chronic small vessel disease and old deep white matter
infarcts are seen bilaterally.  No evidence of hydrocephalus or
abnormal extra-axial fluid collections.  No skull abnormality
identified.
IMPRESSION: 1.  Early subacute left parietal lobe infarct.
2.  No evidence of intracranial hemorrhage, mass effect, or midline
shift.
3.  Chronic small vessel disease and old deep white matter
infarcts.

## 2013-01-23 NOTE — ED Notes (Signed)
Presents post op from ovarian cyst removal Monday at [facility]. Since the surgery she has had amnesia. Unable to recall name, birthdate, SS #, month, year. Able to recall names of family members but having difficulty remebering how they are related. Unable to dial a telephone. She is alert, no facial droop or drift, reports abdominal pain at incision site.

## 2013-01-24 ENCOUNTER — Inpatient Hospital Stay (HOSPITAL_COMMUNITY): Payer: Medicaid Other

## 2013-01-24 ENCOUNTER — Encounter (HOSPITAL_COMMUNITY): Payer: Self-pay | Admitting: Internal Medicine

## 2013-01-24 DIAGNOSIS — I10 Essential (primary) hypertension: Secondary | ICD-10-CM | POA: Diagnosis present

## 2013-01-24 DIAGNOSIS — M7989 Other specified soft tissue disorders: Secondary | ICD-10-CM

## 2013-01-24 DIAGNOSIS — Z8673 Personal history of transient ischemic attack (TIA), and cerebral infarction without residual deficits: Secondary | ICD-10-CM | POA: Diagnosis present

## 2013-01-24 LAB — BASIC METABOLIC PANEL
BUN: 11 mg/dL (ref 6–23)
Calcium: 8.7 mg/dL (ref 8.4–10.5)
Creatinine, Ser: 0.69 mg/dL (ref 0.50–1.10)
GFR calc Af Amer: >90 mL/min (ref >90)

## 2013-01-24 LAB — CBC WITH DIFFERENTIAL/PLATELET
Basophils Absolute: 0 10*3/uL (ref 0.0–0.1)
Eosinophils Absolute: 0.5 10*3/uL (ref 0.0–0.7)
Lymphocytes Relative: 24 % (ref 12–46)
Lymphs Abs: 1.5 10*3/uL (ref 0.7–4.0)
MCHC: 33.3 g/dL (ref 30.0–36.0)
Monocytes Relative: 7 % (ref 3–12)
Neutro Abs: 3.7 10*3/uL (ref 1.7–7.7)
Platelets: 258 10*3/uL (ref 150–400)
RDW: 16.4 % — ABNORMAL HIGH (ref 11.5–15.5)
WBC: 6.1 10*3/uL (ref 4.0–10.5)

## 2013-01-24 LAB — URINALYSIS, ROUTINE W REFLEX MICROSCOPIC
Glucose, UA: NEGATIVE mg/dL
Ketones, ur: NEGATIVE mg/dL
Protein, ur: NEGATIVE mg/dL
pH: 5.5 (ref 5.0–8.0)

## 2013-01-24 LAB — RPR: RPR Ser Ql: NONREACTIVE

## 2013-01-24 LAB — LIPID PANEL
HDL: 41 mg/dL (ref >39)
LDL Cholesterol: 65 mg/dL (ref 0–99)
Total CHOL/HDL Ratio: 3.1 RATIO

## 2013-01-24 LAB — PREGNANCY, URINE: Preg Test, Ur: NEGATIVE

## 2013-01-24 LAB — C4 COMPLEMENT: Complement C4, Body Fluid: 34 mg/dL (ref 10–40)

## 2013-01-24 LAB — URINE MICROSCOPIC-ADD ON

## 2013-01-24 LAB — HIV ANTIBODY (ROUTINE TESTING W REFLEX): HIV: NONREACTIVE

## 2013-01-24 LAB — ANTITHROMBIN III: AntiThromb III Func: 107 % (ref 75–120)

## 2013-01-24 LAB — SEDIMENTATION RATE: Sed Rate: 26 mm/hr — ABNORMAL HIGH (ref 0–22)

## 2013-01-24 LAB — HEMOGLOBIN A1C: Mean Plasma Glucose: 128 mg/dL — ABNORMAL HIGH (ref <117)

## 2013-01-24 IMAGING — CT CT ANGIO NECK
1 of 12 series · 4 of 33 positions shown · IV contrast (CONTRAST)
Comparison: CT head without contrast [DATE].

CTA NECK

CLINICAL DATA: Hypertension.  Follow-up CVA.  Persistent
expressive aphasia.

CT ANGIOGRAPHY HEAD AND NECK
TECHNIQUE: Multidetector CT imaging of the head and neck was
performed using the standard protocol during bolus administration
of intravenous contrast.  Multiplanar CT image reconstructions
including MIPs were obtained to evaluate the vascular anatomy.
Carotid stenosis measurements (when applicable) are obtained
utilizing NASCET criteria, using the distal internal carotid
diameter as the denominator.
Contrast: 50mL OMNIPAQUE IOHEXOL 350 MG/ML SOLN

[Series 8080: 1 x 1 axial · axial · 0.50mm/px · z∈[+137,+314]mm · 4 of 297 slices shown]
[im 60/297  soft-tissue]
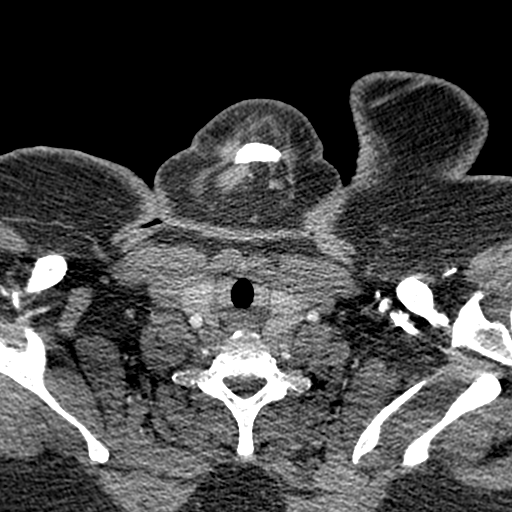
[im 119/297  bone]
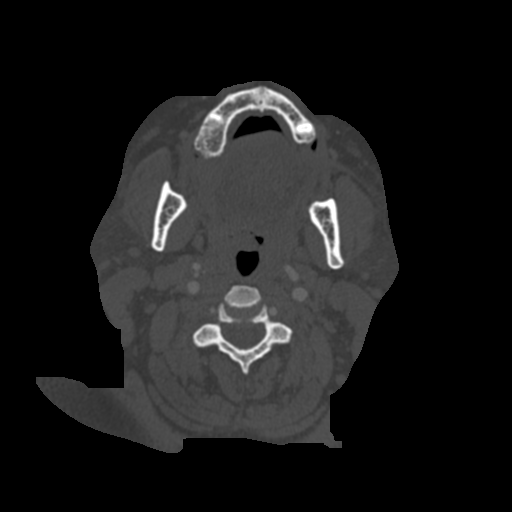
[im 178/297  soft-tissue]
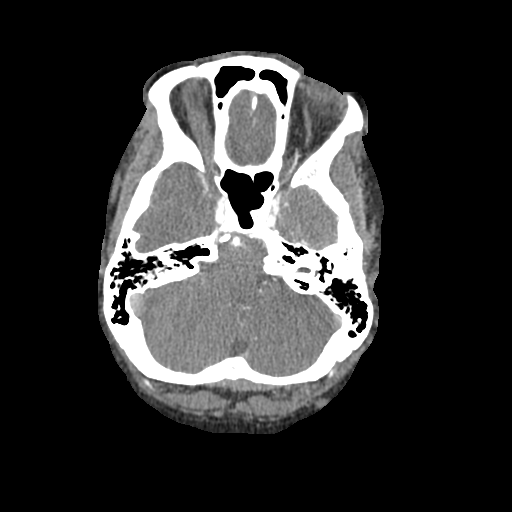
[im 237/297  bone]
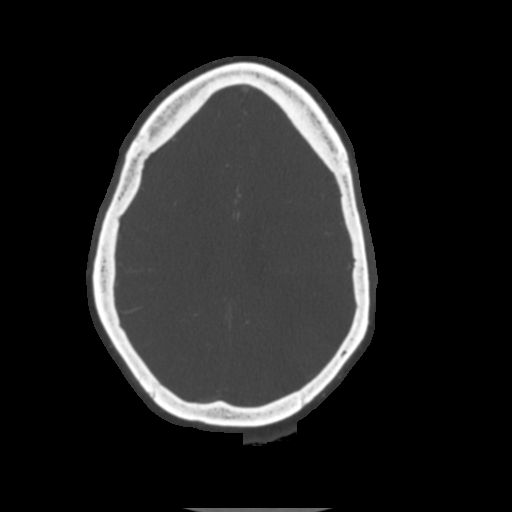

[4 of 33 positions shown; findings below may reference images not displayed]

FINDINGS: A standard three-vessel arch configuration is present.
The vertebral arteries originate from the subclavian arteries
bilaterally.  The left vertebral artery is the dominant vessel.
There are no significant stenoses within the neck.

The right common carotid artery is within normal limits. The
bifurcation is unremarkable.  Right internal carotid artery is
normal.

The left common carotid artery is within normal limits.  The there
is mild irregularity at the bifurcation.  There is no significant
stenosis relative to the distal vessel.

The soft tissues of the neck are unremarkable.  The lung apices are
clear.  The bone windows are unremarkable.

 Review of the MIP images confirms the above findings.
IMPRESSION: 1.  No significant stenosis of the cervical vasculature.
2.  Mild atherosclerotic irregularity of the left carotid
bifurcation without significant stenosis.

CTA HEAD
FINDINGS: The posterior left frontal and parietal lobe cortical
infarct is again noted.  There is no significant change in size.
There is no hemorrhage.  Remote infarcts of the anterior basal
ganglia are again noted bilaterally.  Additional periventricular
white matter hypoattenuation is similar to the prior study.

The postcontrast images demonstrate no pathologic enhancement.
There may be some luxury perfusion within the posterior left MCA
territory infarct.

Mild irregularity is present within the cavernous carotid arteries
bilaterally.  The A1 and M1 segments are normal.

There is irregular segmental attenuation of MCA branch vessels
bilaterally.  The posterior left MCA branch vessel is occluded,
corresponding to the infarct territory.

The AICA vessels are dominant bilaterally.  The basilar artery is
within normal limits.  The right posterior cerebral artery receives
contributions from a posterior communicating artery and the P1
segment.  The left posterior cerebral artery originates from the
basilar tip.  There is mild irregularity of distal PCA branch
vessels without significant proximal stenosis or occlusion.

 Review of the MIP images confirms the above findings.
IMPRESSION: 1.  Occluded distal left MCA branch vessel corresponding to the
infarct territory.
2.  Moderate small vessel disease.
3.  Mild atherosclerotic irregularity within the cavernous carotid
arteries bilaterally without significant stenosis.

## 2013-01-24 MED ORDER — IOHEXOL 350 MG/ML SOLN
50.0000 mL | Freq: Once | INTRAVENOUS | Status: AC | PRN
  Administered 2013-01-24: 50 mL via INTRAVENOUS

## 2013-01-24 MED ORDER — ENOXAPARIN SODIUM 120 MG/0.8ML ~~LOC~~ SOLN
120.0000 mg | Freq: Two times a day (BID) | SUBCUTANEOUS | Status: DC
  Administered 2013-01-24 – 2013-01-25 (×2): 120 mg via SUBCUTANEOUS
  Filled 2013-01-24 (×6): qty 0.8

## 2013-01-24 MED ORDER — SENNOSIDES-DOCUSATE SODIUM 8.6-50 MG PO TABS: 1.0000 | ORAL_TABLET | Freq: Every evening | ORAL | Status: DC | PRN

## 2013-01-24 MED ORDER — ASPIRIN 300 MG RE SUPP
300.0000 mg | Freq: Every day | RECTAL | Status: DC
  Filled 2013-01-24 (×2): qty 1

## 2013-01-24 MED ORDER — AMLODIPINE BESYLATE 10 MG PO TABS
10.0000 mg | ORAL_TABLET | Freq: Every morning | ORAL | Status: DC
  Administered 2013-01-24 – 2013-01-29 (×6): 10 mg via ORAL
  Filled 2013-01-24 (×6): qty 1

## 2013-01-24 MED ORDER — ASPIRIN 325 MG PO TABS
325.0000 mg | ORAL_TABLET | Freq: Every day | ORAL | Status: DC
  Administered 2013-01-24 – 2013-01-25 (×2): 325 mg via ORAL
  Filled 2013-01-24 (×2): qty 1

## 2013-01-24 NOTE — Progress Notes (Signed)
ANTICOAGULATION CONSULT NOTE - Initial Consult  Pharmacy Consult for lovenox Indication: DVT  No Known Allergies  Patient Measurements: Height: 5\' 5"  (165.1 cm) Weight: 271 lb 3.2 oz (123.016 kg) IBW/kg (Calculated) : 57  Heparin Dosing Weight: 120 kg  Vital Signs: Temp: 97.9 F (36.6 C) (02/06 1805) Temp src: Oral (02/06 1805) BP: 139/83 mmHg (02/06 1805) Pulse Rate: 96  (02/06 1805)  Labs:  Basename 01/24/13 0555 01/23/13 2027  HGB 11.8* 13.1  HCT 35.4* 38.6  PLT 258 291  APTT -- --  LABPROT -- --  INR -- --  HEPARINUNFRC -- --  CREATININE 0.69 0.79  CKTOTAL -- --  CKMB -- --  TROPONINI -- --    Estimated Creatinine Clearance: 114.5 ml/min (by C-G formula based on Cr of 0.69).   Medical History: Past Medical History  Diagnosis Date  . Hypertension   . Other and unspecified ovarian cysts     Medications:  Scheduled:    . amLODipine  10 mg Oral q morning - 10a  . aspirin  300 mg Rectal Daily   Or  . aspirin  325 mg Oral Daily   Infusions:    Assessment: 55 female with new DVT of bilateral posterior tibial veins will be started on lovenox therapy.  Patient also has CVA (CT of the brain 01/23/2013 1. Early subacute left parietal lobe infarct. 2. No evidence of intracranial hemorrhage, mass effect, or midline shift).  CrCl > 100.  Baseline H/H 11.8/35.4 and Plt 258 K.  Not on anticoagulation prior to admission.   Goal of Therapy:  Anti-Xa level 0.6-1.2 units/ml 4hrs after LMWH dose given Monitor platelets by anticoagulation protocol: Yes   Plan:  1) Lovenox 120mg  SQ every 12 hours 2) CBC every 72 hours 3) f/u starting oral anticoagulant.  Lempi Edwin, Tsz-Yin 01/24/2013,6:37 PM

## 2013-01-24 NOTE — H&P (Signed)
Mary [name] is an 47 y.o. female.   Patient was seen and examined on January 24, 2013. Chief Complaint: Difficulty speaking and confusion. HPI: 47 year-old female with history of hypertension who was just had a recent ovarian cyst removal at [facility] on February 3 started developing confusion and difficulty bringing out words after she had gone to her house after surgery. Since the symptoms persisted she was brought to the ER over here. Denies any headache, difficulty swallowing or any difficulty moving her lower extremities. Patient had some visual disturbances in the right lower half of visual field. CT head shows early subacute left parietal infarct. Neurologist on call Dr. Amada Jupiter was consulted and at this time patient has been admitted for further management.  Past Medical History  Diagnosis Date  . Hypertension   . Other and unspecified ovarian cysts     Past Surgical History  Procedure Date  . Ovarian cyst removal     Family History  Problem Relation Age of Onset  . Diabetes Mellitus II Mother   . Stroke Other    Social History:  reports that she has never smoked. She does not have any smokeless tobacco history on file. She reports that she does not drink alcohol or use illicit drugs.  Allergies: No Known Allergies   (Not in a hospital admission)  Results for orders placed during the hospital encounter of 01/23/13 (from the past 48 hour(s))  CBC WITH DIFFERENTIAL     Status: Abnormal   Collection Time   01/23/13  8:27 PM      Component Value Range Comment   WBC 6.7  4.0 - 10.5 K/uL    RBC 5.52 (*) 3.87 - 5.11 MIL/uL    Hemoglobin 13.1  12.0 - 15.0 g/dL    HCT 40.9  81.1 - 91.4 %    MCV 69.9 (*) 78.0 - 100.0 fL    MCH 23.7 (*) 26.0 - 34.0 pg    MCHC 33.9  30.0 - 36.0 g/dL    RDW 78.2 (*) 95.6 - 15.5 %    Platelets 291  150 - 400 K/uL    Neutrophils Relative 60  43 - 77 %    Lymphocytes Relative 26  12 - 46 %    Monocytes Relative 6  3 - 12 %    Eosinophils Relative 8 (*) 0 - 5 %    Basophils Relative 0  0 - 1 %    Neutro Abs 4.1  1.7 - 7.7 K/uL    Lymphs Abs 1.7  0.7 - 4.0 K/uL    Monocytes Absolute 0.4  0.1 - 1.0 K/uL    Eosinophils Absolute 0.5  0.0 - 0.7 K/uL    Basophils Absolute 0.0  0.0 - 0.1 K/uL    RBC Morphology POLYCHROMASIA PRESENT     BASIC METABOLIC PANEL     Status: Abnormal   Collection Time   01/23/13  8:27 PM      Component Value Range Comment   Sodium 137  135 - 145 mEq/L    Potassium 3.2 (*) 3.5 - 5.1 mEq/L    Chloride 100  96 - 112 mEq/L    CO2 26  19 - 32 mEq/L    Glucose, Bld 108 (*) 70 - 99 mg/dL    BUN 11  6 - 23 mg/dL    Creatinine, Ser 2.13  0.50 - 1.10 mg/dL    Calcium 9.0  8.4 - 08.6 mg/dL    GFR calc  non Af Amer >90  >90 mL/min    GFR calc Af Amer >90  >90 mL/min    Dg Chest 2 View  01/23/2013  *RADIOLOGY REPORT*  Clinical Data: Memory loss.  Altered mental status.  CHEST - 2 VIEW  Comparison: None.  Findings: Mild lingular scarring is noted.  Lungs otherwise clear. No evidence of pleural effusion.  Heart size and mediastinal contours are normal.  IMPRESSION: No active disease.   Original Report Authenticated By: Myles Rosenthal, M.D.    Ct Head Wo Contrast  01/23/2013  *RADIOLOGY REPORT*  Clinical Data: Amnesia and memory loss.  Postop from pelvic surgery several days ago.  CT HEAD WITHOUT CONTRAST  Technique:  Contiguous axial images were obtained from the base of the skull through the vertex without contrast.  Comparison: None.  Findings: A well circumscribed area of cytotoxic edema is seen in the left parietal lobe, consistent with early subacute infarct. There is no evidence of hemorrhage, mass effect, or midline shift.  No other definite areas of acute to subacute cerebral infarction identified.  Chronic small vessel disease and old deep white matter infarcts are seen bilaterally.  No evidence of hydrocephalus or abnormal extra-axial fluid collections.  No skull abnormality identified.  IMPRESSION:  1.   Early subacute left parietal lobe infarct. 2.  No evidence of intracranial hemorrhage, mass effect, or midline shift. 3.  Chronic small vessel disease and old deep white matter infarcts.   Original Report Authenticated By: Myles Rosenthal, M.D.     Review of Systems  Constitutional: Negative.   HENT: Negative.   Eyes: Negative.   Respiratory: Negative.   Cardiovascular: Negative.   Gastrointestinal: Negative.   Genitourinary: Negative.   Musculoskeletal: Negative.   Skin: Negative.   Neurological: Positive for speech change.       Confusion.  Endo/Heme/Allergies: Negative.   Psychiatric/Behavioral: Negative.     Blood pressure 153/75, pulse 90, temperature 98.4 F (36.9 C), temperature source Oral, resp. rate 22, height 5\' 5"  (1.651 m), weight 123.016 kg (271 lb 3.2 oz), SpO2 100.00%. Physical Exam  Constitutional: She is oriented to person, place, and time. She appears well-developed and well-nourished. No distress.  HENT:  Head: Normocephalic and atraumatic.  Right Ear: External ear normal.  Left Ear: External ear normal.  Nose: Nose normal.  Mouth/Throat: Oropharynx is clear and moist. No oropharyngeal exudate.  Eyes: Conjunctivae normal are normal. Pupils are equal, round, and reactive to light. Right eye exhibits no discharge. Left eye exhibits no discharge. No scleral icterus.  Neck: Normal range of motion. Neck supple.  Cardiovascular: Normal rate and regular rhythm.   Respiratory: Effort normal and breath sounds normal. No respiratory distress. She has no wheezes. She has no rales.  GI: Soft. Bowel sounds are normal. She exhibits no distension. There is no tenderness. There is no rebound and no guarding.  Musculoskeletal: She exhibits no edema and no tenderness.  Neurological: She is alert and oriented to person, place, and time.       Moves all extremities 5/5. No facial asymmetry. Tongue is midline.  Skin: She is not diaphoretic.     Assessment/Plan #1. CVA - patient  has been placed on neurochecks, swallow evaluation. MRI/MRA brain along with carotid Doppler and 2-D echo has been ordered. Monitor shows sinus rhythm. Aspirin. #2. Hypertension - continue home medications. #3. Recent ovarian cyst surgery - can contact [facility] Dr. Chester Holstein at [phone] for further details.  CODE STATUS - full code.  Midge Minium  N. 01/24/2013, 1:21 AM

## 2013-01-24 NOTE — ED Notes (Signed)
MD at bedside. 

## 2013-01-24 NOTE — Evaluation (Signed)
Speech Language Pathology Evaluation Patient Details Name: Mary [name] MRN: [id number] DOB: 08-03-1966 Today's Date: 01/24/2013 Time: 1010-1037 SLP Time Calculation (min): 27 min  Problem List:  Patient Active Problem List  Diagnosis  . CVA (cerebral infarction)  . HTN (hypertension)   Past Medical History:  Past Medical History  Diagnosis Date  . Hypertension   . Other and unspecified ovarian cysts    Past Surgical History:  Past Surgical History  Procedure Date  . Ovarian cyst removal    HPI:  46 up female adm to [facility] after having recent ovarian cyst removal admitted with confusion, ? aphasia, right lower field cut.  CT head showed remote left parietal CVA. MRI of brain is pending.  Speech/language evaluation ordered.    Assessment / Plan / Recommendation Clinical Impression  Pt presents with both expressive more than receptive aphasia with ability to follow one step commands and experssion noted with paraphasias - both phonemic and semantic.  She does present with islands of fluent speech- as she stated "I was used to taking care of myself."  Use of phrase/sentence completion and repetition aided in verbal expression .  Pt became tearful during evaluation appropriately but abated during testing.  Rec intensive speech therapy services to help pt maximize functional communication.  Differential diagnosis also includes possible component of apraxia of speech in addition to aphasia.  Pt educated to strategies to compensate (eg sentence completion, describe, etc) for aphasia and goals and agreed to plan.  No family present for evaluation.  Pt may benefit from CIR consult.      SLP Assessment  Patient needs continued Speech Lanaguage Pathology Services    Follow Up Recommendations  Inpatient Rehab    Frequency and Duration min 2x/week  2 weeks   Pertinent Vitals/Pain Afebrile, decreased   SLP Goals  SLP Goals Potential to Achieve Goals: Fair Progress/Goals/Alternative  treatment plan discussed with pt/caregiver and they: Patient unable to parrticipate in goal setting (pt with neurologist ) SLP Goal #1: Pt will follow two step commands during functional tasks with mod verbal/visual cues. SLP Goal #2: Pt will use compensatory strategies for expressive communication at phrase level with maximum verbal/visual cues and 80% accuracy.  SLP Goal #3: Pt will demonstrate ability to compehend written language at pharse level with mod verbal/visual cues.   SLP Goal #4: Pt will name functional daily items with moderate verbal, articulatory phrase compeltion cues and 80% accuracy.   SLP Evaluation Prior Functioning  Cognitive/Linguistic Baseline: Information not available (no family present, but pt states was independent) Available Help at Discharge:  (unknown, no family present, son in military per pt)   Cognition  Overall Cognitive Status:  (difficult to assess due to language deficit) Orientation Level: Oriented to person;Oriented to place Attention: Sustained Sustained Attention: Appears intact Memory:  (recalled son Conservation officer, historic buildings and her medical event) Awareness: Appears intact (good awareness to comm deficits, errors) Behaviors: Restless (pt tearful due to medical diagnosis)    Comprehension  Auditory Comprehension Commands: Impaired One Step Basic Commands: 75-100% accurate Two Step Basic Commands: 50-74% accurate Interfering Components: Motor planning;Processing speed EffectiveTechniques: Extra processing time;Repetition;Visual/Gestural cues;Slowed speech Visual Recognition/Discrimination Discrimination: Exceptions to [facility] L/R Discrimination: Other (comment) (unable for body part (ear)) Reading Comprehension Reading Status:  (recognized written address- unable to read outloud)    Expression Expression Primary Mode of Expression: Verbal Verbal Expression Overall Verbal Expression: Impaired Initiation: Impaired Automatic Speech: Counting (unable to  state name or months independent, cue to numbers) Repetition: Impaired  Level of Impairment: Phrase level (phrase and multisyllabic level-simple words functional) Naming: Impairment Responsive: 26-50% accurate Confrontation: Impaired (3/7 independently, 4 w/phonemic,artic,phrase completion cues) Pragmatics: No impairment Effective Techniques: Phonemic cues;Sentence completion;Articulatory cues Written Expression Dominant Hand: Right Written Expression: Not tested   Oral / Motor Oral Motor/Sensory Function Overall Oral Motor/Sensory Function: Appears within functional limits for tasks assessed Lingual ROM:  (? excessive lingual movement, difficult holding still) Motor Speech Overall Motor Speech: Impaired (? verbal apraxia and apraxia of speech) Respiration: Within functional limits Phonation: Normal Resonance: Within functional limits Articulation: Within functional limitis Intelligibility: Intelligible Motor Planning: Impaired Level of Impairment: Word (islands of fluent speech - up to sentence level) Motor Speech Errors: Groping for words;Inconsistent;Aware   GO     Donavan Burnet, MS [facility] SLP [phone]

## 2013-01-24 NOTE — Progress Notes (Signed)
Stroke Team Progress Note  HISTORY Mary [name] is a 47 y.o. female who presents with several days of difficulty speaking. She had an ovarian cyst removed on Monday 01/21/13, and immediately following surgery her family noticed that she was having difficulty with her speech, those relatively mild. She is discharged, however did not improve after she got home and therefore her family has brought her back in for reevaluation tonight 01/23/2013. She states that she has trouble both with getting her words out, as well as understanding other people  She had a CT scan showing a subacute left parietal infarct. She had previously been prescribed phentermine, however has never taken it. Patient was not a TPA candidate secondary to delay in arrival. She was admitted for further evaluation and treatment.  SUBJECTIVE Her ST is at the bedside.  Overall she feels her condition is stable. She is frustrated with her language deficit.  OBJECTIVE Most recent Vital Signs: Filed Vitals:   01/24/13 0224 01/24/13 0415 01/24/13 0615 01/24/13 1008  BP: 133/69 136/66 125/80 122/76  Pulse: 81 87 88 90  Temp: 98.1 F (36.7 C) 98.2 F (36.8 C) 98.4 F (36.9 C) 98.6 F (37 C)  TempSrc: Oral Oral Oral Oral  Resp: 20 20 20 20   Height:      Weight:      SpO2: 98% 97% 98% 94%   CBG (last 3)  No results found for this basename: GLUCAP:3 in the last 72 hours  IV Fluid Intake:     MEDICATIONS    . amLODipine  10 mg Oral q morning - 10a  . aspirin  300 mg Rectal Daily   Or  . aspirin  325 mg Oral Daily   PRN:  senna-docusate  Diet:  Cardiac thin liquids Activity:  Bedrest with Bathroom privileges DVT Prophylaxis:  SCDs   CLINICALLY SIGNIFICANT STUDIES Basic Metabolic Panel:  Lab 01/24/13 1610 01/23/13 2027  NA 139 137  K 3.0* 3.2*  CL 103 100  CO2 25 26  GLUCOSE 114* 108*  BUN 11 11  CREATININE 0.69 0.79  CALCIUM 8.7 9.0  MG -- --  PHOS -- --   Liver Function Tests: No results found for this  basename: AST:2,ALT:2,ALKPHOS:2,BILITOT:2,PROT:2,ALBUMIN:2 in the last 168 hours CBC:  Lab 01/24/13 0555 01/23/13 2027  WBC 6.1 6.7  NEUTROABS 3.7 4.1  HGB 11.8* 13.1  HCT 35.4* 38.6  MCV 69.0* 69.9*  PLT 258 291   Coagulation: No results found for this basename: LABPROT:4,INR:4 in the last 168 hours Cardiac Enzymes: No results found for this basename: CKTOTAL:3,CKMB:3,CKMBINDEX:3,TROPONINI:3 in the last 168 hours Urinalysis:  Lab 01/24/13 0607  COLORURINE YELLOW  LABSPEC 1.017  PHURINE 5.5  GLUCOSEU NEGATIVE  HGBUR LARGE*  BILIRUBINUR NEGATIVE  KETONESUR NEGATIVE  PROTEINUR NEGATIVE  UROBILINOGEN 1.0  NITRITE NEGATIVE  LEUKOCYTESUR NEGATIVE   Lipid Panel    Component Value Date/Time   CHOL 128 01/24/2013 0555   TRIG 108 01/24/2013 0555   HDL 41 01/24/2013 0555   CHOLHDL 3.1 01/24/2013 0555   VLDL 22 01/24/2013 0555   LDLCALC 65 01/24/2013 0555   HgbA1C  No results found for this basename: HGBA1C    Urine Drug Screen:   No results found for this basename: labopia, cocainscrnur, labbenz, amphetmu, thcu, labbarb    Alcohol Level: No results found for this basename: ETH:2 in the last 168 hours  CT of the brain  01/23/2013   1.  Early subacute left parietal lobe infarct. 2.  No evidence of  intracranial hemorrhage, mass effect, or midline shift. 3.  Chronic small vessel disease and old deep white matter infarcts.    MRI of the brain    MRA of the brain    2D Echocardiogram    Carotid Doppler    CXR  01/23/2013   No active disease.     EKG  normal sinus rhythm.   Therapy Recommendations   Physical Exam   Pleasant middle aged  [place] lady currently not in distress. Appears frustrated from her speech difficulties.Awake alert. Afebrile. Head is nontraumatic. Neck is supple without bruit. Hearing is normal. Cardiac exam no murmur or gallop. Lungs are clear to auscultation. Distal pulses are well felt. Neurological Exam :  Awake alert oriented x3. Moderate expressive  aphasia with significant nonfluent speech with word finding difficulties and paraphasic errors. Able to name, repeat and comprehend better with mild deficits. No dysarthria. Extraocular moments are full range without nystagmus. Fundi were not visualized. Vision acuity and fields appear normal. Face is symmetric. Tongue is midline. Motor system exam reveals no upper or lower expected drift. His symmetric and equal strength in all 4 extremities. No focal weakness. ASSESSMENT Mary [name] is a 47 y.o. female presenting with speech difficulties, trouble getting words out and trouble understanding. Imaging confirms a left parietal lobe infarct. Infarct felt to be embolic secondary to unknown etiology.  On no antiplatelets prior to admission. Now on aspirin 325 mg orally every day for secondary stroke prevention. Patient with resultant expressive and receptive aphasia (paraphasic errors) and right visual field cut. Work up underway.  Hypertension Obesity, Body mass index is 45.13 kg/(m^2). claustrophobia   Hospital day # 1  TREATMENT/PLAN  Continue aspirin 325 mg orally every day for secondary stroke prevention. check Hypercoagulable panel (minus factor 5 leiden and beta-2-glycoprotein as these test for venous clots) and vasculitic labs (C3, C4, CH50, ANA, ESR) and RPR, HIV TEE to look for embolic source. There are no available slots for tomorrow. May be done as an OP next week. Please arrange with pts cardiologist or cardiologist of choice. Will need to be NPO after midnight. If positive for PFO (patent foramen ovale), check bilateral lower extremity venous dopplers to rule out DVT as possible source of stroke.  Cancel MRI. Can do open MRI as an OP in the future if needed. For now, will check CT angio of the head Change 2D echo to be with contrast and without a bubble as bubble study is very low yield Cancel carotid doppler CT angio of head and neck to look at vasculature  Annie Main, MSN, RN,  ANVP-BC, ANP-BC, GNP-BC [facility] Pager: [phone] 01/24/2013 10:34 AM  I have personally obtained a history, examined the patient, evaluated imaging results, and formulated the assessment and plan of care. I agree with the above.  Delia Heady, MD Medical Director [facility] Pager: [phone] 01/24/2013 2:03 PM

## 2013-01-24 NOTE — Progress Notes (Signed)
UR COMPLETED  

## 2013-01-24 NOTE — ED Provider Notes (Addendum)
History     CSN: [id number]  Arrival date & time 01/23/13  1952   First MD Initiated Contact with Patient 01/23/13 2355      Chief Complaint  Patient presents with  . Memory Loss    (Consider location/radiation/quality/duration/timing/severity/associated sxs/prior treatment) HPI 47 year old female presents to the emergency department with family is concerned that she has not acted right since her surgery on Monday. Patient had ovarian cyst removal at [facility] on Monday. Family reports when she came out of anesthesia and got home, she was acting "daily" laughing inappropriately and been very silly. Patient has had difficulties knowing who she is, poor family is, or ordinary information such as her name Laurell Josephs date so screen number telephone number. She has been ambulatory well, eating and drinking without difficulty, no motor defects noted by family. There's been no clumsiness no facial droop. Patient has history of hypertension, and per family has been out of her blood pressure medicine since October. Patient also has a pill bottle of phentermine in her pocketbook  Past Medical History  Diagnosis Date  . Hypertension   . Other and unspecified ovarian cysts     History reviewed. No pertinent past surgical history.  History reviewed. No pertinent family history.  History  Substance Use Topics  . Smoking status: Never Smoker   . Smokeless tobacco: Not on file  . Alcohol Use: No    OB History    Grav Para Term Preterm Abortions TAB SAB Ect Mult Living                  Review of Systems  Unable to perform ROS: Mental status change    Allergies  Review of patient's allergies indicates no known allergies.  Home Medications   Current Outpatient Rx  Name  Route  Sig  Dispense  Refill  . AMLODIPINE BESYLATE 10 MG PO TABS   Oral   Take 10 mg by mouth every morning.           BP 129/80  Pulse 93  Temp 98.4 F (36.9 C) (Oral)  Resp 18  SpO2 98%  Physical Exam  Nursing  note and vitals reviewed. Constitutional: She appears well-developed and well-nourished. No distress.  HENT:  Head: Normocephalic and atraumatic.  Right Ear: External ear normal.  Left Ear: External ear normal.  Nose: Nose normal.  Mouth/Throat: Oropharynx is clear and moist.  Eyes: Conjunctivae normal and EOM are normal. Pupils are equal, round, and reactive to light.  Neck: Normal range of motion. Neck supple. No JVD present. No tracheal deviation present. No thyromegaly present.  Cardiovascular: Normal rate, regular rhythm, normal heart sounds and intact distal pulses.  Exam reveals no gallop and no friction rub.   No murmur heard. Pulmonary/Chest: Effort normal and breath sounds normal. No stridor. No respiratory distress. She has no wheezes. She has no rales. She exhibits no tenderness.  Abdominal: Soft. Bowel sounds are normal. She exhibits no distension and no mass. There is no tenderness. There is no rebound and no guarding.  Musculoskeletal: Normal range of motion. She exhibits no edema and no tenderness.  Lymphadenopathy:    She has no cervical adenopathy.  Neurological: She is alert. She has normal reflexes. She displays normal reflexes. No cranial nerve deficit. She exhibits normal muscle tone. Coordination normal.       Patient has difficulties following instruction, cannot say her name, or that she is in the hospital, date. Patient does not seem concerned that she's having difficulties  finding words. She has normal grip strength, normal motor strength, normal coordination aside from not being able to follow instructions appropriately  Skin: Skin is warm and dry. No rash noted. No erythema. No pallor.    ED Course  Procedures (including critical care time)  Labs Reviewed  CBC WITH DIFFERENTIAL - Abnormal; Notable for the following:    RBC 5.52 (*)     MCV 69.9 (*)     MCH 23.7 (*)     RDW 16.4 (*)     Eosinophils Relative 8 (*)     All other components within normal  limits  BASIC METABOLIC PANEL - Abnormal; Notable for the following:    Potassium 3.2 (*)     Glucose, Bld 108 (*)     All other components within normal limits  URINALYSIS, ROUTINE W REFLEX MICROSCOPIC   Dg Chest 2 View  01/23/2013  *RADIOLOGY REPORT*  Clinical Data: Memory loss.  Altered mental status.  CHEST - 2 VIEW  Comparison: None.  Findings: Mild lingular scarring is noted.  Lungs otherwise clear. No evidence of pleural effusion.  Heart size and mediastinal contours are normal.  IMPRESSION: No active disease.   Original Report Authenticated By: Myles Rosenthal, M.D.    Ct Head Wo Contrast  01/23/2013  *RADIOLOGY REPORT*  Clinical Data: Amnesia and memory loss.  Postop from pelvic surgery several days ago.  CT HEAD WITHOUT CONTRAST  Technique:  Contiguous axial images were obtained from the base of the skull through the vertex without contrast.  Comparison: None.  Findings: A well circumscribed area of cytotoxic edema is seen in the left parietal lobe, consistent with early subacute infarct. There is no evidence of hemorrhage, mass effect, or midline shift.  No other definite areas of acute to subacute cerebral infarction identified.  Chronic small vessel disease and old deep white matter infarcts are seen bilaterally.  No evidence of hydrocephalus or abnormal extra-axial fluid collections.  No skull abnormality identified.  IMPRESSION:  1.  Early subacute left parietal lobe infarct. 2.  No evidence of intracranial hemorrhage, mass effect, or midline shift. 3.  Chronic small vessel disease and old deep white matter infarcts.   Original Report Authenticated By: Myles Rosenthal, M.D.     Date: 01/24/2013  Rate: 89  Rhythm: normal sinus rhythm  QRS Axis: normal  Intervals: normal  ST/T Wave abnormalities: nonspecific ST changes  Conduction Disutrbances:none  Narrative Interpretation:   Old EKG Reviewed: none available     1. Stroke   2. Aphasia       MDM  47 year old female with early  subacute left parietal infarct. Discussed findings on CT scan with patient and family. Discussed case with neuro hospitalist, Dr. Amada Jupiter who will be down to see the patient. Given recent surgery this is most likely embolic.        Olivia Mackie, MD 01/24/13 5784   Olivia Mackie, MD 01/24/13 6962  Olivia Mackie, MD 01/24/13 [phone]

## 2013-01-24 NOTE — Progress Notes (Signed)
History via family pastor. Pt doesn't have a family physician but she has had a stroke in past about 13 yrs ago, and was put on blood pressure medication, not sure what meds.  Stopped taking her meds when she lost her insurance several years ago.

## 2013-01-24 NOTE — Progress Notes (Signed)
*  Preliminary Results* Bilateral lower extremity venous duplex completed. Study was technically limited due to patient body habitus. Bilateral lower extremities are positive for deep vein thrmbosis involving bilateral posterior tibial veins. Unable to visualize bilateral femoral veins due to technical limitations, therefore unable to exclude deep vein thrombosis in these veins. Preliminary results discussed with Carley Hammed, RN.  01/24/2013 2:59 PM Gertie Fey, RDMS, RDCS

## 2013-01-24 NOTE — Progress Notes (Signed)
[facility] PROGRESS NOTE  Mary [name] QMV:[id number] DOB: [birth year] DOA: 01/23/2013 PCP: No primary provider on file.  Assessment/Plan: Principal Problem:  *CVA (cerebral infarction) Active Problems:  HTN (hypertension)    1. CVA - patient has been placed on neurochecks, swallow evaluation. MRI/MRA brain along with carotid Doppler and 2-D echo with bubble study. Monitor shows sinus rhythm. Aspirin. Given young age recent surgery need to rule out a PFO, we'll do Doppler of lower extremities to rule out a DVT as well. #2. Hypertension - continue home medications.  #3. Recent ovarian cyst surgery - can contact [facility] Dr. Chester Holstein at [phone] for further details   Code Status: full Family Communication: family updated about patient's clinical progress Disposition Plan:  As above    Brief narrative: 47 year-old female with history of hypertension who was just had a recent ovarian cyst removal at [facility] on February 3 started developing confusion and difficulty bringing out words after she had gone to her house after surgery. Since the symptoms persisted she was brought to the ER over here. Denies any headache, difficulty swallowing or any difficulty moving her lower extremities. Patient had some visual disturbances in the right lower half of visual field. CT head shows early subacute left parietal infarct. Neurologist on call Dr. Amada Jupiter was consulted and at this time patient has been admitted for further management   Consultants:  None  Procedures:  None  Antibiotics:  None  HPI/Subjective: Speech has improved overnight  Objective: Filed Vitals:   01/24/13 0224 01/24/13 0415 01/24/13 0615 01/24/13 1008  BP: 133/69 136/66 125/80 122/76  Pulse: 81 87 88 90  Temp: 98.1 F (36.7 C) 98.2 F (36.8 C) 98.4 F (36.9 C) 98.6 F (37 C)  TempSrc: Oral Oral Oral Oral  Resp: 20 20 20 20   Height:      Weight:      SpO2: 98% 97% 98% 94%   No  intake or output data in the 24 hours ending 01/24/13 1040  Exam:  HENT:  Head: Atraumatic.  Nose: Nose normal.  Mouth/Throat: Oropharynx is clear and moist.  Eyes: Conjunctivae are normal. Pupils are equal, round, and reactive to light. No scleral icterus.  Neck: Neck supple. No tracheal deviation present.  Cardiovascular: Normal rate, regular rhythm, normal heart sounds and intact distal pulses.  Pulmonary/Chest: Effort normal and breath sounds normal. No respiratory distress.  Abdominal: Soft. Normal appearance and bowel sounds are normal. She exhibits no distension. There is no tenderness.  Musculoskeletal: She exhibits no edema and no tenderness.  Neurological: She is alert. No cranial nerve deficit.    Data Reviewed: Basic Metabolic Panel:  Lab 01/24/13 0272 01/23/13 2027  NA 139 137  K 3.0* 3.2*  CL 103 100  CO2 25 26  GLUCOSE 114* 108*  BUN 11 11  CREATININE 0.69 0.79  CALCIUM 8.7 9.0  MG -- --  PHOS -- --    Liver Function Tests: No results found for this basename: AST:5,ALT:5,ALKPHOS:5,BILITOT:5,PROT:5,ALBUMIN:5 in the last 168 hours No results found for this basename: LIPASE:5,AMYLASE:5 in the last 168 hours No results found for this basename: AMMONIA:5 in the last 168 hours  CBC:  Lab 01/24/13 0555 01/23/13 2027  WBC 6.1 6.7  NEUTROABS 3.7 4.1  HGB 11.8* 13.1  HCT 35.4* 38.6  MCV 69.0* 69.9*  PLT 258 291    Cardiac Enzymes: No results found for this basename: CKTOTAL:5,CKMB:5,CKMBINDEX:5,TROPONINI:5 in the last 168 hours BNP (last 3 results) No results  found for this basename: PROBNP:3 in the last 8760 hours   CBG: No results found for this basename: GLUCAP:5 in the last 168 hours  No results found for this or any previous visit (from the past 240 hour(s)).   Studies: Dg Chest 2 View  01/23/2013  *RADIOLOGY REPORT*  Clinical Data: Memory loss.  Altered mental status.  CHEST - 2 VIEW  Comparison: None.  Findings: Mild lingular scarring is noted.   Lungs otherwise clear. No evidence of pleural effusion.  Heart size and mediastinal contours are normal.  IMPRESSION: No active disease.   Original Report Authenticated By: Myles Rosenthal, M.D.    Ct Head Wo Contrast  01/23/2013  *RADIOLOGY REPORT*  Clinical Data: Amnesia and memory loss.  Postop from pelvic surgery several days ago.  CT HEAD WITHOUT CONTRAST  Technique:  Contiguous axial images were obtained from the base of the skull through the vertex without contrast.  Comparison: None.  Findings: A well circumscribed area of cytotoxic edema is seen in the left parietal lobe, consistent with early subacute infarct. There is no evidence of hemorrhage, mass effect, or midline shift.  No other definite areas of acute to subacute cerebral infarction identified.  Chronic small vessel disease and old deep white matter infarcts are seen bilaterally.  No evidence of hydrocephalus or abnormal extra-axial fluid collections.  No skull abnormality identified.  IMPRESSION:  1.  Early subacute left parietal lobe infarct. 2.  No evidence of intracranial hemorrhage, mass effect, or midline shift. 3.  Chronic small vessel disease and old deep white matter infarcts.   Original Report Authenticated By: Myles Rosenthal, M.D.     Scheduled Meds:   . amLODipine  10 mg Oral q morning - 10a  . aspirin  300 mg Rectal Daily   Or  . aspirin  325 mg Oral Daily   Continuous Infusions:   Principal Problem:  *CVA (cerebral infarction) Active Problems:  HTN (hypertension)    Time spent: 40 minutes   [facility]  [facility] Pager [phone]. If 8PM-8AM, please contact night-coverage at [email or web address], password [facility] 01/24/2013, 10:40 AM  LOS: 1 day

## 2013-01-24 NOTE — ED Notes (Signed)
The patient's family can be reached at the following:  Marline Morace (son):  [phone] (cell) Patient's Home:  [phone] Satya Bohall (daughter):  [phone] (cell)

## 2013-01-24 NOTE — Progress Notes (Signed)
PT Cancellation Note  Patient Details Name: Mary [name] MRN: [id number] DOB: [birth year]   Cancelled Treatment:    Reason Eval/Treat Not Completed: Other (comment);Patient not medically ready (order to begin 2/7)   Ferman Hamming 01/24/2013, 12:06 PM Weldon Picking PT Acute Rehab Services [phone] [facility]per [phone]

## 2013-01-24 NOTE — ED Notes (Signed)
Per Dr. Norlene Campbell, the patient has had a stroke, but she is outside the window for TPA.  The last seen normal date for the patient is 01/21/13.

## 2013-01-24 NOTE — Consult Note (Signed)
Reason for Consult: Parietal Stroke Referring Physician: Marisa Severin  CC: Speech  History is obtained from: Patient, family  HPI: Mary [name] is a 47 y.o. female who presents with several days of difficulty speaking. She had an ovarian cyst removed on Monday 01/21/13, and immediately following surgery her family noticed that she was having difficulty with her speech, those relatively mild. She is discharged, however did not improve after she got home and therefore her family has brought her back in for reevaluation tonight. She states that she has trouble both with getting her words out, as well as understanding other people  She had a CT scan showing a subacute left parietal infarct. She had previously been prescribed phentermine, however has never taken it.  LKW: 01/21/13 tpa given: no, outside of window.     ROS: A 14 point ROS was performed and is negative except as noted in the HPI.  Past Medical History  Diagnosis Date  . Hypertension   . Other and unspecified ovarian cysts     Family History: Grandfather, aunt - stroke  Social History: Tob: Quit several years ago Lives with mother  Exam: Current vital signs: BP 129/80  Pulse 93  Temp 98.4 F (36.9 C) (Oral)  Resp 18  SpO2 98% Vital signs in last 24 hours: Temp:  [98.4 F (36.9 C)] 98.4 F (36.9 C) (02/05 2013) Pulse Rate:  [89-93] 93  (02/05 2355) Resp:  [16-18] 18  (02/05 2355) BP: (129-134)/(80-88) 129/80 mmHg (02/05 2355) SpO2:  [97 %-98 %] 98 % (02/05 2355)  General: In bed, no apparent distress CV: Regular in rhythm Mental Status: Patient is awake, alert, when asked orientation questions, she has trouble finding her words. She is able to give a history of her recent medication use, or lack thereof Patient has significant difficulty with word finding, as well as difficulty with following more complex commands. She has more difficulty with her right arm doing finger-nose-finger than in her  left Cranial Nerves: II: Visual Fields are notable for right inferior field Pupils are equal, round, and reactive to light.  Discs are sharp. III,IV, VI: EOMI without ptosis or diploplia.  V: Facial sensation is symmetric to temperature VII: Facial movement is symmetric.  VIII: hearing is intact to voice X: Uvula elevates symmetrically XI: Shoulder shrug is symmetric. XII: tongue is midline without atrophy or fasciculations.  Motor: Tone is normal. Bulk is normal. 5/5 strength was present in all four extremities.  Sensory: Sensation is symmetric to light touch and pin in the arms and legs. Deep Tendon Reflexes: 2+ and symmetric in the biceps and patellae.  Cerebellar: FNF performed only with repeated prompting, on right she is slow and repeatedly will provoke my finger, and has to be prompted to return to her nose. After single demonstration she performs it well the left. Gait: Not assessed due to acute nature of evaluation and multiple medical monitors in ED setting.  I have reviewed labs in epic and the results pertinent to this consultation are: Mild hypokalemia  I have reviewed the images obtained:CT head - Subacute left posterior MCA territory infarct.   Impression: 47 year old female with likely embolic left posterior division MCA infarct. She will need to be admitted for full stroke workup. She was not on antiplatelets prior to admission.  Recommendations: 1. HgbA1c, fasting lipid panel 2. MRI, MRA  of the brain without contrast 3. PT consult, OT consult, Speech consult 4. Echocardiogram 5. Carotid dopplers 6. Prophylactic therapy-Antiplatelet med: Aspirin -  dose 325mg  7. Risk factor modification 8. Telemetry monitoring 9. Frequent neuro checks  Ritta Slot, MD [facility] [phone]  If 7pm- 7am, please page neurology on call at [phone].

## 2013-01-25 ENCOUNTER — Encounter (HOSPITAL_COMMUNITY)
Admission: EM | Disposition: A | Discharge status: Home / Self Care | Payer: Self-pay | Source: Home / Self Care | Attending: Internal Medicine

## 2013-01-25 ENCOUNTER — Encounter (HOSPITAL_COMMUNITY): Payer: Self-pay | Admitting: Physical Medicine and Rehabilitation

## 2013-01-25 DIAGNOSIS — I6789 Other cerebrovascular disease: Secondary | ICD-10-CM

## 2013-01-25 HISTORY — PX: TEE WITHOUT CARDIOVERSION: SHX5443

## 2013-01-25 LAB — PROTIME-INR
INR: 1.1 (ref 0.00–1.49)
Prothrombin Time: 14.1 seconds (ref 11.6–15.2)

## 2013-01-25 LAB — LUPUS ANTICOAGULANT PANEL
DRVVT: 38.8 secs (ref <42.9)
Lupus Anticoagulant: DETECTED — AB
PTT Lupus Anticoagulant: 44.3 secs — ABNORMAL HIGH (ref 28.0–43.0)

## 2013-01-25 LAB — PROTEIN C, TOTAL: Protein C, Total: 88 % (ref 72–160)

## 2013-01-25 LAB — CBC
MCH: 23.2 pg — ABNORMAL LOW (ref 26.0–34.0)
MCV: 69.8 fL — ABNORMAL LOW (ref 78.0–100.0)
Platelets: 265 10*3/uL (ref 150–400)
RBC: 5.04 MIL/uL (ref 3.87–5.11)
RDW: 16.5 % — ABNORMAL HIGH (ref 11.5–15.5)
WBC: 5.4 10*3/uL (ref 4.0–10.5)

## 2013-01-25 LAB — CARDIOLIPIN ANTIBODIES, IGG, IGM, IGA
Anticardiolipin IgG: 4 GPL U/mL — ABNORMAL LOW (ref <23)
Anticardiolipin IgM: 3 MPL U/mL — ABNORMAL LOW (ref <11)

## 2013-01-25 LAB — HOMOCYSTEINE: Homocysteine: 8.1 umol/L (ref 4.0–15.4)

## 2013-01-25 LAB — PROTEIN C ACTIVITY: Protein C Activity: 153 % — ABNORMAL HIGH (ref 75–133)

## 2013-01-25 SURGERY — ECHOCARDIOGRAM, TRANSESOPHAGEAL
Anesthesia: Moderate Sedation

## 2013-01-25 MED ORDER — FENTANYL CITRATE 0.05 MG/ML IJ SOLN
INTRAMUSCULAR | Status: DC | PRN
  Administered 2013-01-25 (×5): 25 ug via INTRAVENOUS

## 2013-01-25 MED ORDER — HEPARIN (PORCINE) IN NACL 100-0.45 UNIT/ML-% IJ SOLN
1100.0000 [IU]/h | INTRAMUSCULAR | Status: DC
  Administered 2013-01-25: 1250 [IU]/h via INTRAVENOUS
  Filled 2013-01-25 (×2): qty 250

## 2013-01-25 MED ORDER — DIPHENHYDRAMINE HCL 50 MG/ML IJ SOLN
INTRAMUSCULAR | Status: AC
  Filled 2013-01-25: qty 1

## 2013-01-25 MED ORDER — FENTANYL CITRATE 0.05 MG/ML IJ SOLN
INTRAMUSCULAR | Status: AC
  Filled 2013-01-25: qty 2

## 2013-01-25 MED ORDER — DIPHENHYDRAMINE HCL 50 MG/ML IJ SOLN
INTRAMUSCULAR | Status: DC | PRN
  Administered 2013-01-25: 25 mg via INTRAVENOUS

## 2013-01-25 MED ORDER — MIDAZOLAM HCL 5 MG/ML IJ SOLN
INTRAMUSCULAR | Status: AC
  Filled 2013-01-25: qty 2

## 2013-01-25 MED ORDER — SODIUM CHLORIDE 0.9 % IV SOLN: INTRAVENOUS | Status: DC

## 2013-01-25 MED ORDER — COUMADIN BOOK
Freq: Once | Status: AC
  Administered 2013-01-25: 18:00:00
  Filled 2013-01-25: qty 1

## 2013-01-25 MED ORDER — WARFARIN SODIUM 7.5 MG PO TABS
7.5000 mg | ORAL_TABLET | Freq: Once | ORAL | Status: AC
  Administered 2013-01-25: 7.5 mg via ORAL
  Filled 2013-01-25: qty 1

## 2013-01-25 MED ORDER — MIDAZOLAM HCL 10 MG/2ML IJ SOLN
INTRAMUSCULAR | Status: DC | PRN
  Administered 2013-01-25 (×4): 1 mg via INTRAVENOUS
  Administered 2013-01-25 (×3): 2 mg via INTRAVENOUS

## 2013-01-25 MED ORDER — WARFARIN - PHARMACIST DOSING INPATIENT: Freq: Every day | Status: DC

## 2013-01-25 MED ORDER — BUTAMBEN-TETRACAINE-BENZOCAINE 2-2-14 % EX AERO
INHALATION_SPRAY | CUTANEOUS | Status: DC | PRN
  Administered 2013-01-25: 2 via TOPICAL

## 2013-01-25 MED ORDER — WARFARIN VIDEO
Freq: Once | Status: AC
  Administered 2013-01-25: 18:00:00

## 2013-01-25 NOTE — CV Procedure (Signed)
INDICATIONS:   The patient is 47 years old female with acute CVA is here to r/o PFO and clot/vegetation.Marland Kitchen  PROCEDURE:  Informed consent was discussed including risks, benefits and alternatives for the procedure.  Risks include, but are not limited to, cough, sore throat, vomiting, nausea, somnolence, esophageal and stomach trauma or perforation, bleeding, low blood pressure, aspiration, pneumonia, infection, trauma to the teeth and death.    Patient was given 10 versed, 125 fentanyl and 25 of benadryl for sedation.  The oropharynx was anesthetized with topical lidocaine.  The transesophageal probe was inserted in the esophagus and stomach and multiple views were obtained.  Agitated saline was used after the transesophageal probe was removed from the body.  The patient was kept under observation until the patient left the procedure room.  The patient left the procedure room in stable condition.   COMPLICATIONS:  There were no immediate complications.  FINDINGS:  1. LEFT VENTRICLE: The left ventricle has mild LVH and normal function.  Wall motion is normal.  No thrombus or masses seen in the left ventricle.  2. RIGHT VENTRICLE:  The right ventricle is normal in structure and function without any thrombus or masses.    3. LEFT ATRIUM:  The left atrium is normal without any thrombus or masses.  4. LEFT ATRIAL APPENDAGE:  The left atrial appendage is free of any thrombus or masses.  5. RIGHT ATRIUM:  The right atrium is free of any thrombus or masses.    6. ATRIAL SEPTUM:  The atrial septum is normal without any ASD or PFO with agitated saline injection.  7. MITRAL VALVE:  The mitral valve is normal in structure and function with trivial regurgitation, no masses, stenosis or vegetations.  8. TRICUSPID VALVE:  The tricuspid valve is normal in structure and function without regurgitation, masses, stenosis or vegetations.  9. AORTIC VALVE:  The aortic valve is normal in structure and function  without regurgitation, masses, stenosis or vegetations.   10. PULMONIC VALVE:  The pulmonic valve is normal in structure and function without regurgitation, masses, stenosis or vegetations.  11. AORTIC ARCH, ASCENDING AND DESCENDING AORTA:  The aorta had minimal atherosclerosis in the ascending or descending aorta.  The aortic arch was normal.  IMPRESSION:   1.Normal LV systolic function. 2.No vegetation, clot or PFO. 3.Minimal atherosclerosis in descending thoracic aorta.  RECOMMENDATIONS:    Medical treatment.

## 2013-01-25 NOTE — Progress Notes (Addendum)
ANTICOAGULATION CONSULT NOTE - Follow Up Consult  Pharmacy Consult for Lovenox --> Heparin/Coumadin Indication: acute DVT and CVA  No Known Allergies  Patient Measurements: Height: 5\' 5"  (165.1 cm) Weight: 271 lb 3.2 oz (123.016 kg) IBW/kg (Calculated) : 57  Heparin Dosing Weight: 87kg  Vital Signs: Temp: 97.7 F (36.5 C) (02/07 0600) Temp src: Oral (02/07 0600) BP: 120/69 mmHg (02/07 0600) Pulse Rate: 78  (02/07 0600)  Labs:  Basename 01/25/13 0615 01/24/13 0555 01/23/13 2027  HGB 11.7* 11.8* --  HCT 35.2* 35.4* 38.6  PLT 265 258 291  APTT -- -- --  LABPROT -- -- --  INR -- -- --  HEPARINUNFRC -- -- --  CREATININE -- 0.69 0.79  CKTOTAL -- -- --  CKMB -- -- --  TROPONINI -- -- --    Estimated Creatinine Clearance: 114.5 ml/min (by C-G formula based on Cr of 0.69).  Assessment: 46yof admitted with acute DVT and CVA to transition from Lovenox to Heparin and Coumadin. Per Stroke team, patient's insurance will not pay for newer anticoagulants. Patient received last Lovenox dose this AM (~1019) - will initiate heparin drip later today targeting lower heparin levels (0.3-0.5) due to acute CVA.  - Baseline INR: ordered - H/H trended down, Plts wnl - No significant bleeding reported - Heparin weight: 87kg - Warfarin points:   Goal of Therapy:  INR 2-3 Heparin level 0.3-0.5 units/ml Monitor platelets by anticoagulation protocol: Yes   Plan:  1. Start Heparin drip 1250 units/hr (12.5 ml/hr) @ 2015 2. Check heparin level ~6 hrs after initiation (0300 on 2/8) 3. Coumadin 7.5mg  po x 1 today 4. Daily heparin level, CBC, INR 5. Coumadin educational book/video 6. Discussed ASA with Biby PA - received order to discontinue  Cleon Dew [phone] 01/25/2013,12:24 PM

## 2013-01-25 NOTE — Progress Notes (Signed)
Stroke Team Progress Note  HISTORY Mary [name] is a 47 y.o. female who presents with several days of difficulty speaking. She had an ovarian cyst removed on Monday 01/21/13, and immediately following surgery her family noticed that she was having difficulty with her speech, those relatively mild. She is discharged, however did not improve after she got home and therefore her family has brought her back in for reevaluation tonight 01/23/2013. She states that she has trouble both with getting her words out, as well as understanding other people  She had a CT scan showing a subacute left parietal infarct. She had previously been prescribed phentermine, however has never taken it. Patient was not a TPA candidate secondary to delay in arrival. She was admitted for further evaluation and treatment.  SUBJECTIVE Patient is up in the chair at the bedside.  OBJECTIVE Most recent Vital Signs: Filed Vitals:   01/24/13 1805 01/24/13 2200 01/25/13 0200 01/25/13 0600  BP: 139/83 119/53 118/72 120/69  Pulse: 96 84 86 78  Temp: 97.9 F (36.6 C) 97.5 F (36.4 C) 97.7 F (36.5 C) 97.7 F (36.5 C)  TempSrc: Oral Oral Oral Oral  Resp: 20 20 20 20   Height:      Weight:      SpO2: 96% 98% 96% 96%   CBG (last 3)  No results found for this basename: GLUCAP:3 in the last 72 hours  IV Fluid Intake:     MEDICATIONS   . amLODipine  10 mg Oral q morning - 10a  . aspirin  300 mg Rectal Daily   Or  . aspirin  325 mg Oral Daily  . enoxaparin (LOVENOX) injection  120 mg Subcutaneous Q12H   PRN:  senna-docusate  Diet:  Cardiac thin liquids Activity:  As tolerated DVT Prophylaxis:  SCDs   CLINICALLY SIGNIFICANT STUDIES Basic Metabolic Panel:   Lab 01/24/13 0555 01/23/13 2027  NA 139 137  K 3.0* 3.2*  CL 103 100  CO2 25 26  GLUCOSE 114* 108*  BUN 11 11  CREATININE 0.69 0.79  CALCIUM 8.7 9.0  MG -- --  PHOS -- --   Liver Function Tests: No results found for this basename:  AST:2,ALT:2,ALKPHOS:2,BILITOT:2,PROT:2,ALBUMIN:2 in the last 168 hours CBC:   Lab 01/25/13 0615 01/24/13 0555 01/23/13 2027  WBC 5.4 6.1 --  NEUTROABS -- 3.7 4.1  HGB 11.7* 11.8* --  HCT 35.2* 35.4* --  MCV 69.8* 69.0* --  PLT 265 258 --   Coagulation: No results found for this basename: LABPROT:4,INR:4 in the last 168 hours Cardiac Enzymes: No results found for this basename: CKTOTAL:3,CKMB:3,CKMBINDEX:3,TROPONINI:3 in the last 168 hours Urinalysis:   Lab 01/24/13 0607  COLORURINE YELLOW  LABSPEC 1.017  PHURINE 5.5  GLUCOSEU NEGATIVE  HGBUR LARGE*  BILIRUBINUR NEGATIVE  KETONESUR NEGATIVE  PROTEINUR NEGATIVE  UROBILINOGEN 1.0  NITRITE NEGATIVE  LEUKOCYTESUR NEGATIVE   Lipid Panel    Component Value Date/Time   CHOL 128 01/24/2013 0555   TRIG 108 01/24/2013 0555   HDL 41 01/24/2013 0555   CHOLHDL 3.1 01/24/2013 0555   VLDL 22 01/24/2013 0555   LDLCALC 65 01/24/2013 0555   HgbA1C  Lab Results  Component Value Date   HGBA1C 6.1* 01/24/2013    Urine Drug Screen:   No results found for this basename: labopia,  cocainscrnur,  labbenz,  amphetmu,  thcu,  labbarb    Alcohol Level: No results found for this basename: ETH:2 in the last 168 hours  Hypercoagulable Panel: Normal:  C3, C4,  ANA, RPR, HIV, Anti III, homocysteine ESR - 26 Pending:  CH50, Prot C, Prot S, lupus anticoagulant, cardiolipin antibody  CT of the brain  01/23/2013   1.  Early subacute left parietal lobe infarct. 2.  No evidence of intracranial hemorrhage, mass effect, or midline shift. 3.  Chronic small vessel disease and old deep white matter infarcts.    CT angio of the head 1. Occluded distal left MCA branch vessel corresponding to the infarct territory. 2. Moderate small vessel disease. 3. Mild atherosclerotic irregularity within the cavernous carotid arteries bilaterally without significant stenosis.  CT angio of the neck 1. No significant stenosis of the cervical vasculature. 2. Mild atherosclerotic  irregularity of the left carotid bifurcation without significant stenosis.  MRI of the brain    MRA of the brain    2D Echocardiogram    Carotid Doppler  See CTA of the neck  LE venous dopplers  Bilateral lower extremities are positive for deep vein thrmbosis involving bilateral posterior tibial veins. Unable to visualize bilateral femoral veins due to technical limitations, therefore unable to exclude deep vein thrombosis in these veins.  TEE    CXR  01/23/2013   No active disease.     EKG  normal sinus rhythm.   Therapy Recommendations   Physical Exam   Pleasant middle aged  [place] lady currently not in distress. Appears frustrated from her speech difficulties.Awake alert. Afebrile. Head is nontraumatic. Neck is supple without bruit. Hearing is normal. Cardiac exam no murmur or gallop. Lungs are clear to auscultation. Distal pulses are well felt. Neurological Exam :  Awake alert oriented x3. Moderate expressive aphasia with significant nonfluent speech with word finding difficulties and paraphasic errors. Able to name, repeat and comprehend better with mild deficits. No dysarthria. Extraocular moments are full range without nystagmus. Fundi were not visualized. Vision acuity and fields appear normal. Face is symmetric. Tongue is midline. Motor system exam reveals no upper or lower expected drift. His symmetric and equal strength in all 4 extremities. No focal weakness.  ASSESSMENT Mary [name] is a 47 y.o. female presenting with speech difficulties, trouble getting words out and trouble understanding. Imaging confirms a left parietal lobe infarct. Infarct felt to be embolic. Patient with bilateral LE VTE, if PFO present, these LE VTEs could be associated with her stroke. If no PFO, other hypercoagulable source likely. On no antiplatelets prior to admission. Now on full dose lovenox for secondary stroke prevention. Patient with resultant expressive and receptive aphasia  (paraphasic errors) and right visual field cut. Work up underway.  Hypertension Obesity, Body mass index is 45.13 kg/(m^2). claustrophobia   Hospital day # 2  TREATMENT/PLAN  Change  full dose lovenox to IV heparin for secondary stroke prevention. Prefer new oral anticoagulant in case manager can make arrangements for coverage, otherwise, need to start coumadin today. F/u Hypercoagulable panel TEE to look for embolic source. Dr. Sharyn Lull has been consulted and will try to do today if there is a slot available. May be done as an OP next week from neuro standpoint.  Do open MRI as an OP in the future F/u 2D echo   Annie Main, MSN, RN, ANVP-BC, ANP-BC, GNP-BC [facility] Pager: [phone] 01/25/2013 10:46 AM  I have personally obtained a history, examined the patient, evaluated imaging results, and formulated the assessment and plan of care. I agree with the above.  Delia Heady, MD Medical Director [facility] Pager: [phone] 01/25/2013 10:46 AM

## 2013-01-25 NOTE — Evaluation (Signed)
Physical Therapy Evaluation Patient Details Name: Mary [name] MRN: [id number] DOB: 1966/03/11 Today's Date: 01/25/2013 Time: 2725-3664 PT Time Calculation (min): 17 min  PT Assessment / Plan / Recommendation Clinical Impression  Pt. was admitted following an ovarian cyst removal surgery in [place] with DC home.  She experienced post op difficulty with speech which did not clear up over several days and was brought to [facility].  CT scan was posistive for a subacute left parietal infarct.  She presents to PT with seemingly good ability to understand therapist and able to follow directions but significant expressive apahsia.  From a mobility standpoint, she was able to walk a short distance in her room with min assist from this therapist, before being taken for testing downstairs.  Seh does not appear to have any motor deficits, but will need further evaluating from PT.  She does appear generally weak from recent surgery and 2 hospitalizations and will benefit from PT to address this and any other issue revealed with ongoing evaluation.      PT Assessment  Patient needs continued PT services    Follow Up Recommendations  CIR;Supervision/Assistance - 24 hour    Does the patient have the potential to tolerate intense rehabilitation      Barriers to Discharge None      Equipment Recommendations  None recommended by PT;Other (comment) (to be further assessed)    Recommendations for Other Services Rehab consult (may need CIR consult depending on results of further eval)   Frequency Min 4X/week    Precautions / Restrictions Precautions Precautions: Other (comment) (expressive aphasia) Restrictions Weight Bearing Restrictions: No   Pertinent Vitals/Pain Denies pain; no apparent distress       Mobility  Bed Mobility Bed Mobility: Sit to Supine Sit to Supine: 5: Supervision Details for Bed Mobility Assistance: pt. able to manage transition without physical assist,  supervision for safety Transfers Transfers: Sit to Stand;Stand to Sit Sit to Stand: 4: Min guard Stand to Sit: 4: Min guard Details for Transfer Assistance: min guard assist provided to ensure safety, no physicl assist needed Ambulation/Gait Ambulation/Gait Assistance: 4: Min assist Ambulation Distance (Feet): 20 Feet Assistive device: 1 person hand held assist Ambulation/Gait Assistance Details: pt. needed min assist for safety and stability Gait Pattern: Step-through pattern Stairs: No Wheelchair Mobility Wheelchair Mobility: No Modified Rankin (Stroke Patients Only) Pre-Morbid Rankin Score: No symptoms Modified Rankin: Moderate disability (including speech deficits)    Exercises     PT Diagnosis: Difficulty walking;Generalized weakness  PT Problem List: Decreased activity tolerance;Decreased knowledge of precautions;Decreased mobility PT Treatment Interventions: Gait training;Functional mobility training;Therapeutic activities;Patient/family education   PT Goals Acute Rehab PT Goals PT Goal Formulation: With patient Time For Goal Achievement: 02/01/13 Potential to Achieve Goals: Good Pt will go Sit to Stand: Independently PT Goal: Sit to Stand - Progress: Goal set today Pt will go Stand to Sit: Independently PT Goal: Stand to Sit - Progress: Goal set today Pt will Transfer Bed to Chair/Chair to Bed: Independently PT Transfer Goal: Bed to Chair/Chair to Bed - Progress: Goal set today Pt will Ambulate: >150 feet;with modified independence;with least restrictive assistive device PT Goal: Ambulate - Progress: Goal set today Pt will Go Up / Down Stairs: Flight;with min assist;with rail(s) PT Goal: Up/Down Stairs - Progress: Goal set today  Visit Information  Last PT Received On: 01/25/13 Assistance Needed: +1    Subjective Data  Subjective: "I'm trying to say..."  "Am I gonna get better?" Patient Stated Goal: "  I want to talk some more"   Prior Functioning  Home  Living Lives With: Family;Other (Comment) (mother) Available Help at Discharge: Available 24 hours/day;Family Type of Home: House Home Access: Level entry Home Layout: Two level Alternate Level Stairs-Number of Steps: 17 Alternate Level Stairs-Rails: Right Bathroom Shower/Tub: Walk-in shower Prior Function Level of Independence: Independent Able to Take Stairs?: Yes Driving: Yes Vocation: Unemployed Communication Communication: Expressive difficulties;Other (comment) (seemed to understand most/all of info I provided to her) Dominant Hand: Right    Cognition  Cognition Overall Cognitive Status: Appears within functional limits for tasks assessed/performed Arousal/Alertness: Awake/alert Orientation Level: Other (comment);Oriented X4 / Intact (pt. provided info from multiple choice quesitons) Behavior During Session: [facility] for tasks performed    Extremity/Trunk Assessment Right Upper Extremity Assessment RUE ROM/Strength/Tone: [facility] for tasks assessed (grossly WFL) Left Upper Extremity Assessment LUE ROM/Strength/Tone: WFL for tasks assessed (grossly WFL) Right Lower Extremity Assessment RLE ROM/Strength/Tone: WFL for tasks assessed (grossly WFL) RLE Coordination: WFL - gross/fine motor Left Lower Extremity Assessment LLE ROM/Strength/Tone: WFL for tasks assessed (grossly WFL) LLE Coordination: WFL - gross/fine motor Trunk Assessment Trunk Assessment: Normal   Balance    End of Session PT - End of Session Equipment Utilized During Treatment: Gait belt Activity Tolerance: Patient tolerated treatment well Patient left: in bed;with nursing in room;Other (comment) (pt. to be taken to echo for testing) Nurse Communication: Mobility status  GP     Ferman Hamming 01/25/2013, 8:36 AM Weldon Picking PT Acute Rehab Services [phone] Beeper [phone]

## 2013-01-25 NOTE — Progress Notes (Signed)
Physical Therapy Treatment Patient Details Name: Mary [name] MRN: [id number] DOB: 03/30/1966 Today's Date: 01/25/2013 Time: 6578-4696 PT Time Calculation (min): 10 min  PT Assessment / Plan / Recommendation Comments on Treatment Session  Pt. seen briefly this pm for further assessment since session limited this am for pt.going down for test.  She needed  visual cueing along with verbal cueing for LE MMT (?receptive aphasia vs. Apraxia).  LE strength good with no focal deficits.     Follow Up Recommendations  Outpatient PT;Supervision/Assistance - 24 hour     Does the patient have the potential to tolerate intense rehabilitation     Barriers to Discharge        Equipment Recommendations  None recommended by PT;Other (comment)    Recommendations for Other Services    Frequency Min 4X/week   Plan Discharge plan needs to be updated    Precautions / Restrictions Precautions Precaution Comments: pt with global aphasia, mild right field deficit Restrictions Weight Bearing Restrictions: No   Pertinent Vitals/Pain No pain, no distress    Mobility  Bed Mobility Bed Mobility: Sit to Supine Sit to Supine: 5: Supervision Details for Bed Mobility Assistance: pt. able to manage transition without physical assist, supervision for safety Transfers Transfers: Sit to Stand;Stand to Sit Sit to Stand: 5: Supervision Stand to Sit: 5: Supervision Details for Transfer Assistance: supervision for safety  Ambulation/Gait Ambulation/Gait Assistance: 4: Min assist Ambulation Distance (Feet): 7 Feet Assistive device: 1 person hand held assist Ambulation/Gait Assistance Details: min assist for safety and stabioity Gait Pattern: Step-through pattern Stairs: No    Exercises     PT Diagnosis:    PT Problem List:   PT Treatment Interventions:     PT Goals Acute Rehab PT Goals PT Goal Formulation: With patient Time For Goal Achievement: 02/01/13 Potential to Achieve Goals: Good Pt will  go Sit to Stand: Independently PT Goal: Sit to Stand - Progress: Met Pt will go Stand to Sit: Independently PT Goal: Stand to Sit - Progress: Met Pt will Transfer Bed to Chair/Chair to Bed: Independently PT Transfer Goal: Bed to Chair/Chair to Bed - Progress: Progressing toward goal Pt will Ambulate: >150 feet;with modified independence;with least restrictive assistive device PT Goal: Ambulate - Progress: Progressing toward goal  Visit Information  Last PT Received On: 01/25/13 Assistance Needed: +1    Subjective Data  Subjective: "Am I going for?"  (referring to another test this pm)   Cognition  Cognition Overall Cognitive Status: Appears within functional limits for tasks assessed/performed Arousal/Alertness: Awake/alert Orientation Level: Appears intact for tasks assessed Behavior During Session: [facility] for tasks performed Cognition - Other Comments: Pt with receptive and expressive difficulties    Balance  Balance Balance Assessed: Yes Dynamic Standing Balance Dynamic Standing - Balance Support: No upper extremity supported Dynamic Standing - Level of Assistance: 7: Independent  End of Session PT - End of Session Equipment Utilized During Treatment: Gait belt Activity Tolerance: Patient tolerated treatment well Patient left: in bed;with bed alarm set;with call bell/phone within reach Nurse Communication: Mobility status   GP     Ferman Hamming 01/25/2013, 3:31 PM Weldon Picking PT Acute Rehab Services [phone] Beeper [phone]

## 2013-01-25 NOTE — Progress Notes (Signed)
  Echocardiogram Echocardiogram Transesophageal has been performed.  Mary [name], Mary [name] 01/25/2013, 5:08 PM

## 2013-01-25 NOTE — Consult Note (Signed)
Physical Medicine and Rehabilitation Consult Reason for Consult: Difficulty speaking, confusion and some problems with vision Referring Physician:  Dr. Susie Cassette.   HPI: Mary [name] is a 47 y.o. female with history of HTN (no medications due to financial constraints),  morbid obesity, ovarian cyst removal on 01/21/13 at [facility] who was developed  mild difficulty speaking post procedure that continued to worsen therefore she was evaluated in ED on 01/23/13. CT head with early subacute left parietal infarct. CTA head/neck with occluded distal L-MCA, moderate small vessel disease. BLE dopplers positive for DVT involving bilateral posterior tibial veins, difficulty visualizing femoral veins. Hyper-coagulopathy panel and TEE recommended by neurology. Patient started on treatment dose lovenox and work up ongoing. PT/ST evaluations done and patient noted to have expressive deficits with question of apraxia. MD, PT, ST recommending CIR.    Review of Systems  HENT: Negative for hearing loss.   Eyes: Negative for blurred vision and double vision.  Respiratory: Positive for shortness of breath. Negative for cough.   Cardiovascular: Negative for chest pain and palpitations.  Gastrointestinal: Negative for nausea and vomiting.  Genitourinary: Negative for urgency and frequency.  Musculoskeletal: Negative for myalgias, back pain and joint pain.  Neurological: Positive for speech change. Negative for dizziness, tingling and headaches.   Past Medical History  Diagnosis Date  . Hypertension   . Other and unspecified ovarian cysts    Past Surgical History  Procedure Date  . Ovarian cyst removal    Family History  Problem Relation Age of Onset  . Diabetes Mellitus II Mother   . Stroke Other    Social History:  Lives with  Mother (retired). Was laid off few months ago. She  reports that she quit smoking 4-5 years ago. She does not have any smokeless tobacco history on file. She reports that she does not  drink alcohol or use illicit drugs.  Allergies: No Known Allergies  Medications Prior to Admission  Medication Sig Dispense Refill  . amLODipine (NORVASC) 10 MG tablet Take 10 mg by mouth every morning.        Home: Home Living Lives With: Family;Other (Comment) (mother) Available Help at Discharge: Available 24 hours/day;Family Type of Home: House Home Access: Level entry Home Layout: Two level Alternate Level Stairs-Number of Steps: 17 Alternate Level Stairs-Rails: Right Bathroom Shower/Tub: Walk-in shower  Functional History: Prior Function Able to Take Stairs?: Yes Driving: Yes Vocation: Unemployed Functional Status:  Mobility: Bed Mobility Bed Mobility: Sit to Supine Sit to Supine: 5: Supervision Transfers Transfers: Sit to Stand;Stand to Sit Sit to Stand: 4: Min guard Stand to Sit: 4: Min guard Ambulation/Gait Ambulation/Gait Assistance: 4: Min Environmental consultant (Feet): 20 Feet Assistive device: 1 person hand held assist Ambulation/Gait Assistance Details: pt. needed min assist for safety and stability Gait Pattern: Step-through pattern Stairs: No Wheelchair Mobility Wheelchair Mobility: No  ADL:    Cognition: Cognition Overall Cognitive Status:  (difficult to assess due to language deficit) Arousal/Alertness: Awake/alert Orientation Level: Oriented X4;Other (comment) (patient aware and appropriate; cannot answer based on aphasi) Attention: Sustained Sustained Attention: Appears intact Memory:  (recalled son Conservation officer, historic buildings and her medical event) Awareness: Appears intact (good awareness to comm deficits, errors) Behaviors: Restless (pt tearful due to medical diagnosis) Cognition Overall Cognitive Status: Appears within functional limits for tasks assessed/performed Arousal/Alertness: Awake/alert Orientation Level: Other (comment);Oriented X4 / Intact (pt. provided info from multiple choice quesitons) Behavior During Session: [facility] for tasks  performed  Blood pressure 120/69, pulse 78, temperature 97.7 F (  36.5 C), temperature source Oral, resp. rate 20, height 5\' 5"  (1.651 m), weight 123.016 kg (271 lb 3.2 oz), SpO2 96.00%. Physical Exam  Nursing note and vitals reviewed. Constitutional: She is oriented to person, place, and time. She appears well-developed and well-nourished.  HENT:  Head: Normocephalic and atraumatic.  Eyes: Pupils are equal, round, and reactive to light.  Neck: Normal range of motion.  Cardiovascular: Normal rate and regular rhythm.   Pulmonary/Chest: Effort normal and breath sounds normal.  Abdominal: Soft. Bowel sounds are normal.  Neurological: She is alert and oriented to person, place, and time.       Expressive > receptive aphasia. Difficulty following one step commands consistently--motor apraxia?. Frustrated regarding lack of ability to communicate. Unable to write for communication. Moves all four.  Skin: Skin is warm and dry.    Results for orders placed during the hospital encounter of 01/23/13 (from the past 24 hour(s))  C3 COMPLEMENT     Status: Normal   Collection Time   01/24/13  1:30 PM      Component Value Range   C3 Complement 160  90 - 180 mg/dL  C4 COMPLEMENT     Status: Normal   Collection Time   01/24/13  1:30 PM      Component Value Range   Complement C4, Body Fluid 34  10 - 40 mg/dL  ANA     Status: Normal   Collection Time   01/24/13  1:30 PM      Component Value Range   ANA NEGATIVE  NEGATIVE  SEDIMENTATION RATE     Status: Abnormal   Collection Time   01/24/13  1:30 PM      Component Value Range   Sed Rate 26 (*) 0 - 22 mm/hr  RPR     Status: Normal   Collection Time   01/24/13  1:30 PM      Component Value Range   RPR NON REACTIVE  NON REACTIVE  HIV ANTIBODY (ROUTINE TESTING)     Status: Normal   Collection Time   01/24/13  1:30 PM      Component Value Range   HIV NON REACTIVE  NON REACTIVE  ANTITHROMBIN III     Status: Normal   Collection Time   01/24/13  1:30 PM       Component Value Range   AntiThromb III Func 107  75 - 120 %  HOMOCYSTEINE     Status: Normal   Collection Time   01/24/13  1:30 PM      Component Value Range   Homocysteine 8.1  4.0 - 15.4 umol/L  CBC     Status: Abnormal   Collection Time   01/25/13  6:15 AM      Component Value Range   WBC 5.4  4.0 - 10.5 K/uL   RBC 5.04  3.87 - 5.11 MIL/uL   Hemoglobin 11.7 (*) 12.0 - 15.0 g/dL   HCT 96.0 (*) 45.4 - 09.8 %   MCV 69.8 (*) 78.0 - 100.0 fL   MCH 23.2 (*) 26.0 - 34.0 pg   MCHC 33.2  30.0 - 36.0 g/dL   RDW 11.9 (*) 14.7 - 82.9 %   Platelets 265  150 - 400 K/uL   Ct Angio Head W/cm &/or Wo Cm  01/24/2013  *RADIOLOGY REPORT*  Clinical Data:  Hypertension.  Follow-up CVA.  Persistent expressive aphasia.  CT ANGIOGRAPHY HEAD AND NECK  Technique:  Multidetector CT imaging of the head and neck was  performed using the standard protocol during bolus administration of intravenous contrast.  Multiplanar CT image reconstructions including MIPs were obtained to evaluate the vascular anatomy. Carotid stenosis measurements (when applicable) are obtained utilizing NASCET criteria, using the distal internal carotid diameter as the denominator.  Contrast: 50mL OMNIPAQUE IOHEXOL 350 MG/ML SOLN  Comparison:  CT head without contrast 01/23/2013.  CTA NECK  Findings:  A standard three-vessel arch configuration is present. The vertebral arteries originate from the subclavian arteries bilaterally.  The left vertebral artery is the dominant vessel. There are no significant stenoses within the neck.  The right common carotid artery is within normal limits. The bifurcation is unremarkable.  Right internal carotid artery is normal.  The left common carotid artery is within normal limits.  The there is mild irregularity at the bifurcation.  There is no significant stenosis relative to the distal vessel.  The soft tissues of the neck are unremarkable.  The lung apices are clear.  The bone windows are unremarkable.   Review of  the MIP images confirms the above findings.  IMPRESSION:  1.  No significant stenosis of the cervical vasculature. 2.  Mild atherosclerotic irregularity of the left carotid bifurcation without significant stenosis.  CTA HEAD  Findings:  The posterior left frontal and parietal lobe cortical infarct is again noted.  There is no significant change in size. There is no hemorrhage.  Remote infarcts of the anterior basal ganglia are again noted bilaterally.  Additional periventricular white matter hypoattenuation is similar to the prior study.  The postcontrast images demonstrate no pathologic enhancement. There may be some luxury perfusion within the posterior left MCA territory infarct.  Mild irregularity is present within the cavernous carotid arteries bilaterally.  The A1 and M1 segments are normal.  There is irregular segmental attenuation of MCA branch vessels bilaterally.  The posterior left MCA branch vessel is occluded, corresponding to the infarct territory.  The AICA vessels are dominant bilaterally.  The basilar artery is within normal limits.  The right posterior cerebral artery receives contributions from a posterior communicating artery and the P1 segment.  The left posterior cerebral artery originates from the basilar tip.  There is mild irregularity of distal PCA branch vessels without significant proximal stenosis or occlusion.   Review of the MIP images confirms the above findings.  IMPRESSION:  1.  Occluded distal left MCA branch vessel corresponding to the infarct territory. 2.  Moderate small vessel disease. 3.  Mild atherosclerotic irregularity within the cavernous carotid arteries bilaterally without significant stenosis.   Original Report Authenticated By: Marin Roberts, M.D.    Dg Chest 2 View  01/23/2013  *RADIOLOGY REPORT*  Clinical Data: Memory loss.  Altered mental status.  CHEST - 2 VIEW  Comparison: None.  Findings: Mild lingular scarring is noted.  Lungs otherwise clear. No  evidence of pleural effusion.  Heart size and mediastinal contours are normal.  IMPRESSION: No active disease.   Original Report Authenticated By: Myles Rosenthal, M.D.    Ct Head Wo Contrast  01/23/2013  *RADIOLOGY REPORT*  Clinical Data: Amnesia and memory loss.  Postop from pelvic surgery several days ago.  CT HEAD WITHOUT CONTRAST  Technique:  Contiguous axial images were obtained from the base of the skull through the vertex without contrast.  Comparison: None.  Findings: A well circumscribed area of cytotoxic edema is seen in the left parietal lobe, consistent with early subacute infarct. There is no evidence of hemorrhage, mass effect, or midline shift.  No other definite  areas of acute to subacute cerebral infarction identified.  Chronic small vessel disease and old deep white matter infarcts are seen bilaterally.  No evidence of hydrocephalus or abnormal extra-axial fluid collections.  No skull abnormality identified.  IMPRESSION:  1.  Early subacute left parietal lobe infarct. 2.  No evidence of intracranial hemorrhage, mass effect, or midline shift. 3.  Chronic small vessel disease and old deep white matter infarcts.   Original Report Authenticated By: Myles Rosenthal, M.D.    Ct Angio Neck W/cm &/or Wo/cm  01/24/2013  *RADIOLOGY REPORT*  Clinical Data:  Hypertension.  Follow-up CVA.  Persistent expressive aphasia.  CT ANGIOGRAPHY HEAD AND NECK  Technique:  Multidetector CT imaging of the head and neck was performed using the standard protocol during bolus administration of intravenous contrast.  Multiplanar CT image reconstructions including MIPs were obtained to evaluate the vascular anatomy. Carotid stenosis measurements (when applicable) are obtained utilizing NASCET criteria, using the distal internal carotid diameter as the denominator.  Contrast: 50mL OMNIPAQUE IOHEXOL 350 MG/ML SOLN  Comparison:  CT head without contrast 01/23/2013.  CTA NECK  Findings:  A standard three-vessel arch configuration is  present. The vertebral arteries originate from the subclavian arteries bilaterally.  The left vertebral artery is the dominant vessel. There are no significant stenoses within the neck.  The right common carotid artery is within normal limits. The bifurcation is unremarkable.  Right internal carotid artery is normal.  The left common carotid artery is within normal limits.  The there is mild irregularity at the bifurcation.  There is no significant stenosis relative to the distal vessel.  The soft tissues of the neck are unremarkable.  The lung apices are clear.  The bone windows are unremarkable.   Review of the MIP images confirms the above findings.  IMPRESSION:  1.  No significant stenosis of the cervical vasculature. 2.  Mild atherosclerotic irregularity of the left carotid bifurcation without significant stenosis.  CTA HEAD  Findings:  The posterior left frontal and parietal lobe cortical infarct is again noted.  There is no significant change in size. There is no hemorrhage.  Remote infarcts of the anterior basal ganglia are again noted bilaterally.  Additional periventricular white matter hypoattenuation is similar to the prior study.  The postcontrast images demonstrate no pathologic enhancement. There may be some luxury perfusion within the posterior left MCA territory infarct.  Mild irregularity is present within the cavernous carotid arteries bilaterally.  The A1 and M1 segments are normal.  There is irregular segmental attenuation of MCA branch vessels bilaterally.  The posterior left MCA branch vessel is occluded, corresponding to the infarct territory.  The AICA vessels are dominant bilaterally.  The basilar artery is within normal limits.  The right posterior cerebral artery receives contributions from a posterior communicating artery and the P1 segment.  The left posterior cerebral artery originates from the basilar tip.  There is mild irregularity of distal PCA branch vessels without significant  proximal stenosis or occlusion.   Review of the MIP images confirms the above findings.  IMPRESSION:  1.  Occluded distal left MCA branch vessel corresponding to the infarct territory. 2.  Moderate small vessel disease. 3.  Mild atherosclerotic irregularity within the cavernous carotid arteries bilaterally without significant stenosis.   Original Report Authenticated By: Marin Roberts, M.D.     Assessment/Plan: Diagnosis: CVA  This patient presently is too high level for CIR. Recommend outpt PT, OT, and SLP.  Ranelle Oyster, MD, [place]

## 2013-01-25 NOTE — Progress Notes (Addendum)
[facility] PROGRESS NOTE  Mary [name] ZOX:[id number] DOB: 1966-09-19 DOA: 01/23/2013 PCP: No primary provider on file.  Assessment/Plan: Principal Problem:  *CVA (cerebral infarction) Active Problems:  HTN (hypertension)   1. CVA - patient has been placed on neurochecks, swallow evaluation. On aspirin. Hypercoagulable workup is pending. She needs a TEE, discussed with Dr. Sharyn Lull he will see her today. MRI canceled, CT angiography of the neck shows no significant stenosis except for mild irregularity of the left carotid bifurcation. Occluded distal left MCA consistent with a stroke territory.  ,#2. Hypertension -  stable on Norvasc  #3. Recent ovarian cyst surgery - can contact [facility] Dr. Chester Holstein at [phone] for further details  #4 bilateral DVT started on Lovenox, will probably switch to heparin gtt until TEE , after TEE, switch to xarelto or pradaxa  Code Status: full  Family Communication: family updated about patient's clinical progress  Disposition Plan: As above     Brief narrative: 47 y.o. female who presents with several days of difficulty speaking. She had an ovarian cyst removed on Monday 01/21/13, and immediately following surgery her family noticed that she was having difficulty with her speech, those relatively mild. She is discharged, however did not improve after she got home and therefore her family has brought her back in for reevaluation tonight 01/23/2013. She states that she has trouble both with getting her words out, as well as understanding other people  She had a CT scan showing a subacute left parietal infarct. She had previously been prescribed phentermine, however has never taken it. Patient was not a TPA candidate secondary to delay in arrival. She was admitted for further evaluation and treatment.   Consultants:  Neurology Cardiology  Procedures:  None Antibiotics:  None HPI/Subjective: Stable obvernight   Objective: Filed  Vitals:   01/24/13 1805 01/24/13 2200 01/25/13 0200 01/25/13 0600  BP: 139/83 119/53 118/72 120/69  Pulse: 96 84 86 78  Temp: 97.9 F (36.6 C) 97.5 F (36.4 C) 97.7 F (36.5 C) 97.7 F (36.5 C)  TempSrc: Oral Oral Oral Oral  Resp: 20 20 20 20   Height:      Weight:      SpO2: 96% 98% 96% 96%    Intake/Output Summary (Last 24 hours) at 01/25/13 0954 Last data filed at 01/25/13 0802  Gross per 24 hour  Intake    240 ml  Output      0 ml  Net    240 ml    Exam:  HENT:  Head: Atraumatic.  Nose: Nose normal.  Mouth/Throat: Oropharynx is clear and moist.  Eyes: Conjunctivae are normal. Pupils are equal, round, and reactive to light. No scleral icterus.  Neck: Neck supple. No tracheal deviation present.  Cardiovascular: Normal rate, regular rhythm, normal heart sounds and intact distal pulses.  Pulmonary/Chest: Effort normal and breath sounds normal. No respiratory distress.  Abdominal: Soft. Normal appearance and bowel sounds are normal. She exhibits no distension. There is no tenderness.  Musculoskeletal: She exhibits no edema and no tenderness.  Neurological: She is alert. No cranial nerve deficit.    Data Reviewed: Basic Metabolic Panel:  Lab 01/24/13 5621 01/23/13 2027  NA 139 137  K 3.0* 3.2*  CL 103 100  CO2 25 26  GLUCOSE 114* 108*  BUN 11 11  CREATININE 0.69 0.79  CALCIUM 8.7 9.0  MG -- --  PHOS -- --    Liver Function Tests: No results found for this basename: AST:5,ALT:5,ALKPHOS:5,BILITOT:5,PROT:5,ALBUMIN:5 in the  last 168 hours No results found for this basename: LIPASE:5,AMYLASE:5 in the last 168 hours No results found for this basename: AMMONIA:5 in the last 168 hours  CBC:  Lab 01/25/13 0615 01/24/13 0555 01/23/13 2027  WBC 5.4 6.1 6.7  NEUTROABS -- 3.7 4.1  HGB 11.7* 11.8* 13.1  HCT 35.2* 35.4* 38.6  MCV 69.8* 69.0* 69.9*  PLT 265 258 291    Cardiac Enzymes: No results found for this basename: CKTOTAL:5,CKMB:5,CKMBINDEX:5,TROPONINI:5 in  the last 168 hours BNP (last 3 results) No results found for this basename: PROBNP:3 in the last 8760 hours   CBG: No results found for this basename: GLUCAP:5 in the last 168 hours  No results found for this or any previous visit (from the past 240 hour(s)).   Studies: Ct Angio Head W/cm &/or Wo Cm  01/24/2013  *RADIOLOGY REPORT*  Clinical Data:  Hypertension.  Follow-up CVA.  Persistent expressive aphasia.  CT ANGIOGRAPHY HEAD AND NECK  Technique:  Multidetector CT imaging of the head and neck was performed using the standard protocol during bolus administration of intravenous contrast.  Multiplanar CT image reconstructions including MIPs were obtained to evaluate the vascular anatomy. Carotid stenosis measurements (when applicable) are obtained utilizing NASCET criteria, using the distal internal carotid diameter as the denominator.  Contrast: 50mL OMNIPAQUE IOHEXOL 350 MG/ML SOLN  Comparison:  CT head without contrast 01/23/2013.  CTA NECK  Findings:  A standard three-vessel arch configuration is present. The vertebral arteries originate from the subclavian arteries bilaterally.  The left vertebral artery is the dominant vessel. There are no significant stenoses within the neck.  The right common carotid artery is within normal limits. The bifurcation is unremarkable.  Right internal carotid artery is normal.  The left common carotid artery is within normal limits.  The there is mild irregularity at the bifurcation.  There is no significant stenosis relative to the distal vessel.  The soft tissues of the neck are unremarkable.  The lung apices are clear.  The bone windows are unremarkable.   Review of the MIP images confirms the above findings.  IMPRESSION:  1.  No significant stenosis of the cervical vasculature. 2.  Mild atherosclerotic irregularity of the left carotid bifurcation without significant stenosis.  CTA HEAD  Findings:  The posterior left frontal and parietal lobe cortical infarct is  again noted.  There is no significant change in size. There is no hemorrhage.  Remote infarcts of the anterior basal ganglia are again noted bilaterally.  Additional periventricular white matter hypoattenuation is similar to the prior study.  The postcontrast images demonstrate no pathologic enhancement. There may be some luxury perfusion within the posterior left MCA territory infarct.  Mild irregularity is present within the cavernous carotid arteries bilaterally.  The A1 and M1 segments are normal.  There is irregular segmental attenuation of MCA branch vessels bilaterally.  The posterior left MCA branch vessel is occluded, corresponding to the infarct territory.  The AICA vessels are dominant bilaterally.  The basilar artery is within normal limits.  The right posterior cerebral artery receives contributions from a posterior communicating artery and the P1 segment.  The left posterior cerebral artery originates from the basilar tip.  There is mild irregularity of distal PCA branch vessels without significant proximal stenosis or occlusion.   Review of the MIP images confirms the above findings.  IMPRESSION:  1.  Occluded distal left MCA branch vessel corresponding to the infarct territory. 2.  Moderate small vessel disease. 3.  Mild atherosclerotic irregularity  within the cavernous carotid arteries bilaterally without significant stenosis.   Original Report Authenticated By: Marin Roberts, M.D.    Dg Chest 2 View  01/23/2013  *RADIOLOGY REPORT*  Clinical Data: Memory loss.  Altered mental status.  CHEST - 2 VIEW  Comparison: None.  Findings: Mild lingular scarring is noted.  Lungs otherwise clear. No evidence of pleural effusion.  Heart size and mediastinal contours are normal.  IMPRESSION: No active disease.   Original Report Authenticated By: Myles Rosenthal, M.D.    Ct Head Wo Contrast  01/23/2013  *RADIOLOGY REPORT*  Clinical Data: Amnesia and memory loss.  Postop from pelvic surgery several days ago.   CT HEAD WITHOUT CONTRAST  Technique:  Contiguous axial images were obtained from the base of the skull through the vertex without contrast.  Comparison: None.  Findings: A well circumscribed area of cytotoxic edema is seen in the left parietal lobe, consistent with early subacute infarct. There is no evidence of hemorrhage, mass effect, or midline shift.  No other definite areas of acute to subacute cerebral infarction identified.  Chronic small vessel disease and old deep white matter infarcts are seen bilaterally.  No evidence of hydrocephalus or abnormal extra-axial fluid collections.  No skull abnormality identified.  IMPRESSION:  1.  Early subacute left parietal lobe infarct. 2.  No evidence of intracranial hemorrhage, mass effect, or midline shift. 3.  Chronic small vessel disease and old deep white matter infarcts.   Original Report Authenticated By: Myles Rosenthal, M.D.    Ct Angio Neck W/cm &/or Wo/cm  01/24/2013  *RADIOLOGY REPORT*  Clinical Data:  Hypertension.  Follow-up CVA.  Persistent expressive aphasia.  CT ANGIOGRAPHY HEAD AND NECK  Technique:  Multidetector CT imaging of the head and neck was performed using the standard protocol during bolus administration of intravenous contrast.  Multiplanar CT image reconstructions including MIPs were obtained to evaluate the vascular anatomy. Carotid stenosis measurements (when applicable) are obtained utilizing NASCET criteria, using the distal internal carotid diameter as the denominator.  Contrast: 50mL OMNIPAQUE IOHEXOL 350 MG/ML SOLN  Comparison:  CT head without contrast 01/23/2013.  CTA NECK  Findings:  A standard three-vessel arch configuration is present. The vertebral arteries originate from the subclavian arteries bilaterally.  The left vertebral artery is the dominant vessel. There are no significant stenoses within the neck.  The right common carotid artery is within normal limits. The bifurcation is unremarkable.  Right internal carotid artery is  normal.  The left common carotid artery is within normal limits.  The there is mild irregularity at the bifurcation.  There is no significant stenosis relative to the distal vessel.  The soft tissues of the neck are unremarkable.  The lung apices are clear.  The bone windows are unremarkable.   Review of the MIP images confirms the above findings.  IMPRESSION:  1.  No significant stenosis of the cervical vasculature. 2.  Mild atherosclerotic irregularity of the left carotid bifurcation without significant stenosis.  CTA HEAD  Findings:  The posterior left frontal and parietal lobe cortical infarct is again noted.  There is no significant change in size. There is no hemorrhage.  Remote infarcts of the anterior basal ganglia are again noted bilaterally.  Additional periventricular white matter hypoattenuation is similar to the prior study.  The postcontrast images demonstrate no pathologic enhancement. There may be some luxury perfusion within the posterior left MCA territory infarct.  Mild irregularity is present within the cavernous carotid arteries bilaterally.  The A1 and M1  segments are normal.  There is irregular segmental attenuation of MCA branch vessels bilaterally.  The posterior left MCA branch vessel is occluded, corresponding to the infarct territory.  The AICA vessels are dominant bilaterally.  The basilar artery is within normal limits.  The right posterior cerebral artery receives contributions from a posterior communicating artery and the P1 segment.  The left posterior cerebral artery originates from the basilar tip.  There is mild irregularity of distal PCA branch vessels without significant proximal stenosis or occlusion.   Review of the MIP images confirms the above findings.  IMPRESSION:  1.  Occluded distal left MCA branch vessel corresponding to the infarct territory. 2.  Moderate small vessel disease. 3.  Mild atherosclerotic irregularity within the cavernous carotid arteries bilaterally  without significant stenosis.   Original Report Authenticated By: Marin Roberts, M.D.     Scheduled Meds:   . amLODipine  10 mg Oral q morning - 10a  . aspirin  300 mg Rectal Daily   Or  . aspirin  325 mg Oral Daily  . enoxaparin (LOVENOX) injection  120 mg Subcutaneous Q12H   Continuous Infusions:   Principal Problem:  *CVA (cerebral infarction) Active Problems:  HTN (hypertension)    Time spent: 40 minutes   [facility]  [facility] Pager [phone]. If 8PM-8AM, please contact night-coverage at [email or web address], password [facility] 01/25/2013, 9:54 AM  LOS: 2 days

## 2013-01-25 NOTE — Progress Notes (Signed)
Rehab Admissions Coordinator Note:  Patient was screened by  Roseanna Rainbow for appropriateness for an Inpatient Acute Rehab Consult. Noted pt is ambulating with HHA. At this level of mobility pt may not be appropriate for CIR.  Will re-evaluate after pt has OT evaluation.  Meryl Dare 01/25/2013, 10:00 AM  I can be reached at [phone].

## 2013-01-25 NOTE — Consult Note (Signed)
Reason for Consult:TEE to rule out PFO/ASD Referring Physician: triad hospitalist  Mary [name] is an 47 y.o. female.  Mary [name] is 47 year old female with past medical history significant for hypertension status post subacute left parietal lobe infarct with occluded distal left MCA, bilateral DVT of the leg, status post recent ovarian cyst resection on February 3 was noted to have acute confusion associated with difficult speech and was noted to have surgical left parietal lobe infarct. Cardiology consultation is called for transesophageal echocardiogram to rule out PFO. Patient denies any chest pain shortness of breath. Denies any palpitation lightheadedness. Denies any cardiac workup in the past. Denies any weakness in the arms or legs. Denies headache. Although still has somedifficulty with speech.  Past Medical History  Diagnosis Date  . Hypertension   . Other and unspecified ovarian cysts     Past Surgical History  Procedure Date  . Ovarian cyst removal     Family History  Problem Relation Age of Onset  . Diabetes Mellitus II Mother   . Stroke Other     Social History:  reports that she has never smoked. She does not have any smokeless tobacco history on file. She reports that she does not drink alcohol or use illicit drugs.  Allergies: No Known Allergies  Medications: I have reviewed the patient's current medications.  Results for orders placed during the hospital encounter of 01/23/13 (from the past 48 hour(s))  CBC WITH DIFFERENTIAL     Status: Abnormal   Collection Time   01/23/13  8:27 PM      Component Value Range Comment   WBC 6.7  4.0 - 10.5 K/uL    RBC 5.52 (*) 3.87 - 5.11 MIL/uL    Hemoglobin 13.1  12.0 - 15.0 g/dL    HCT 09.8  11.9 - 14.7 %    MCV 69.9 (*) 78.0 - 100.0 fL    MCH 23.7 (*) 26.0 - 34.0 pg    MCHC 33.9  30.0 - 36.0 g/dL    RDW 82.9 (*) 56.2 - 15.5 %    Platelets 291  150 - 400 K/uL    Neutrophils Relative 60  43 - 77 %    Lymphocytes  Relative 26  12 - 46 %    Monocytes Relative 6  3 - 12 %    Eosinophils Relative 8 (*) 0 - 5 %    Basophils Relative 0  0 - 1 %    Neutro Abs 4.1  1.7 - 7.7 K/uL    Lymphs Abs 1.7  0.7 - 4.0 K/uL    Monocytes Absolute 0.4  0.1 - 1.0 K/uL    Eosinophils Absolute 0.5  0.0 - 0.7 K/uL    Basophils Absolute 0.0  0.0 - 0.1 K/uL    RBC Morphology POLYCHROMASIA PRESENT     BASIC METABOLIC PANEL     Status: Abnormal   Collection Time   01/23/13  8:27 PM      Component Value Range Comment   Sodium 137  135 - 145 mEq/L    Potassium 3.2 (*) 3.5 - 5.1 mEq/L    Chloride 100  96 - 112 mEq/L    CO2 26  19 - 32 mEq/L    Glucose, Bld 108 (*) 70 - 99 mg/dL    BUN 11  6 - 23 mg/dL    Creatinine, Ser 1.30  0.50 - 1.10 mg/dL    Calcium 9.0  8.4 - 86.5 mg/dL    GFR calc non  Af Amer >90  >90 mL/min    GFR calc Af Amer >90  >90 mL/min   BASIC METABOLIC PANEL     Status: Abnormal   Collection Time   01/24/13  5:55 AM      Component Value Range Comment   Sodium 139  135 - 145 mEq/L    Potassium 3.0 (*) 3.5 - 5.1 mEq/L    Chloride 103  96 - 112 mEq/L    CO2 25  19 - 32 mEq/L    Glucose, Bld 114 (*) 70 - 99 mg/dL    BUN 11  6 - 23 mg/dL    Creatinine, Ser 1.61  0.50 - 1.10 mg/dL    Calcium 8.7  8.4 - 09.6 mg/dL    GFR calc non Af Amer >90  >90 mL/min    GFR calc Af Amer >90  >90 mL/min   CBC WITH DIFFERENTIAL     Status: Abnormal   Collection Time   01/24/13  5:55 AM      Component Value Range Comment   WBC 6.1  4.0 - 10.5 K/uL    RBC 5.13 (*) 3.87 - 5.11 MIL/uL    Hemoglobin 11.8 (*) 12.0 - 15.0 g/dL    HCT 04.5 (*) 40.9 - 46.0 %    MCV 69.0 (*) 78.0 - 100.0 fL    MCH 23.0 (*) 26.0 - 34.0 pg    MCHC 33.3  30.0 - 36.0 g/dL    RDW 81.1 (*) 91.4 - 15.5 %    Platelets 258  150 - 400 K/uL    Neutrophils Relative 61  43 - 77 %    Lymphocytes Relative 24  12 - 46 %    Monocytes Relative 7  3 - 12 %    Eosinophils Relative 8 (*) 0 - 5 %    Basophils Relative 0  0 - 1 %    Neutro Abs 3.7  1.7 - 7.7 K/uL     Lymphs Abs 1.5  0.7 - 4.0 K/uL    Monocytes Absolute 0.4  0.1 - 1.0 K/uL    Eosinophils Absolute 0.5  0.0 - 0.7 K/uL    Basophils Absolute 0.0  0.0 - 0.1 K/uL    RBC Morphology ELLIPTOCYTES     TSH     Status: Normal   Collection Time   01/24/13  5:55 AM      Component Value Range Comment   TSH 1.003  0.350 - 4.500 uIU/mL   HEMOGLOBIN A1C     Status: Abnormal   Collection Time   01/24/13  5:55 AM      Component Value Range Comment   Hemoglobin A1C 6.1 (*) <5.7 %    Mean Plasma Glucose 128 (*) <117 mg/dL   LIPID PANEL     Status: Normal   Collection Time   01/24/13  5:55 AM      Component Value Range Comment   Cholesterol 128  0 - 200 mg/dL    Triglycerides 782  <956 mg/dL    HDL 41  >21 mg/dL    Total CHOL/HDL Ratio 3.1      VLDL 22  0 - 40 mg/dL    LDL Cholesterol 65  0 - 99 mg/dL   URINALYSIS, ROUTINE W REFLEX MICROSCOPIC     Status: Abnormal   Collection Time   01/24/13  6:07 AM      Component Value Range Comment   Color, Urine YELLOW  YELLOW    APPearance  CLEAR  CLEAR    Specific Gravity, Urine 1.017  1.005 - 1.030    pH 5.5  5.0 - 8.0    Glucose, UA NEGATIVE  NEGATIVE mg/dL    Hgb urine dipstick LARGE (*) NEGATIVE    Bilirubin Urine NEGATIVE  NEGATIVE    Ketones, ur NEGATIVE  NEGATIVE mg/dL    Protein, ur NEGATIVE  NEGATIVE mg/dL    Urobilinogen, UA 1.0  0.0 - 1.0 mg/dL    Nitrite NEGATIVE  NEGATIVE    Leukocytes, UA NEGATIVE  NEGATIVE   URINE MICROSCOPIC-ADD ON     Status: Normal   Collection Time   01/24/13  6:07 AM      Component Value Range Comment   Squamous Epithelial / LPF RARE  RARE    WBC, UA 0-2  <3 WBC/hpf    RBC / HPF 0-2  <3 RBC/hpf    Bacteria, UA RARE  RARE   PREGNANCY, URINE     Status: Normal   Collection Time   01/24/13  6:21 AM      Component Value Range Comment   Preg Test, Ur NEGATIVE  NEGATIVE   C3 COMPLEMENT     Status: Normal   Collection Time   01/24/13  1:30 PM      Component Value Range Comment   C3 Complement 160  90 - 180 mg/dL   C4  COMPLEMENT     Status: Normal   Collection Time   01/24/13  1:30 PM      Component Value Range Comment   Complement C4, Body Fluid 34  10 - 40 mg/dL   SEDIMENTATION RATE     Status: Abnormal   Collection Time   01/24/13  1:30 PM      Component Value Range Comment   Sed Rate 26 (*) 0 - 22 mm/hr   RPR     Status: Normal   Collection Time   01/24/13  1:30 PM      Component Value Range Comment   RPR NON REACTIVE  NON REACTIVE   HIV ANTIBODY (ROUTINE TESTING)     Status: Normal   Collection Time   01/24/13  1:30 PM      Component Value Range Comment   HIV NON REACTIVE  NON REACTIVE   ANTITHROMBIN III     Status: Normal   Collection Time   01/24/13  1:30 PM      Component Value Range Comment   AntiThromb III Func 107  75 - 120 %   HOMOCYSTEINE     Status: Normal   Collection Time   01/24/13  1:30 PM      Component Value Range Comment   Homocysteine 8.1  4.0 - 15.4 umol/L   CBC     Status: Abnormal   Collection Time   01/25/13  6:15 AM      Component Value Range Comment   WBC 5.4  4.0 - 10.5 K/uL    RBC 5.04  3.87 - 5.11 MIL/uL    Hemoglobin 11.7 (*) 12.0 - 15.0 g/dL    HCT 16.1 (*) 09.6 - 46.0 %    MCV 69.8 (*) 78.0 - 100.0 fL    MCH 23.2 (*) 26.0 - 34.0 pg    MCHC 33.2  30.0 - 36.0 g/dL    RDW 04.5 (*) 40.9 - 15.5 %    Platelets 265  150 - 400 K/uL     Ct Angio Head W/cm &/or Wo Cm  01/24/2013  *RADIOLOGY  REPORT*  Clinical Data:  Hypertension.  Follow-up CVA.  Persistent expressive aphasia.  CT ANGIOGRAPHY HEAD AND NECK  Technique:  Multidetector CT imaging of the head and neck was performed using the standard protocol during bolus administration of intravenous contrast.  Multiplanar CT image reconstructions including MIPs were obtained to evaluate the vascular anatomy. Carotid stenosis measurements (when applicable) are obtained utilizing NASCET criteria, using the distal internal carotid diameter as the denominator.  Contrast: 50mL OMNIPAQUE IOHEXOL 350 MG/ML SOLN  Comparison:  CT head  without contrast 01/23/2013.  CTA NECK  Findings:  A standard three-vessel arch configuration is present. The vertebral arteries originate from the subclavian arteries bilaterally.  The left vertebral artery is the dominant vessel. There are no significant stenoses within the neck.  The right common carotid artery is within normal limits. The bifurcation is unremarkable.  Right internal carotid artery is normal.  The left common carotid artery is within normal limits.  The there is mild irregularity at the bifurcation.  There is no significant stenosis relative to the distal vessel.  The soft tissues of the neck are unremarkable.  The lung apices are clear.  The bone windows are unremarkable.   Review of the MIP images confirms the above findings.  IMPRESSION:  1.  No significant stenosis of the cervical vasculature. 2.  Mild atherosclerotic irregularity of the left carotid bifurcation without significant stenosis.  CTA HEAD  Findings:  The posterior left frontal and parietal lobe cortical infarct is again noted.  There is no significant change in size. There is no hemorrhage.  Remote infarcts of the anterior basal ganglia are again noted bilaterally.  Additional periventricular white matter hypoattenuation is similar to the prior study.  The postcontrast images demonstrate no pathologic enhancement. There may be some luxury perfusion within the posterior left MCA territory infarct.  Mild irregularity is present within the cavernous carotid arteries bilaterally.  The A1 and M1 segments are normal.  There is irregular segmental attenuation of MCA branch vessels bilaterally.  The posterior left MCA branch vessel is occluded, corresponding to the infarct territory.  The AICA vessels are dominant bilaterally.  The basilar artery is within normal limits.  The right posterior cerebral artery receives contributions from a posterior communicating artery and the P1 segment.  The left posterior cerebral artery originates from  the basilar tip.  There is mild irregularity of distal PCA branch vessels without significant proximal stenosis or occlusion.   Review of the MIP images confirms the above findings.  IMPRESSION:  1.  Occluded distal left MCA branch vessel corresponding to the infarct territory. 2.  Moderate small vessel disease. 3.  Mild atherosclerotic irregularity within the cavernous carotid arteries bilaterally without significant stenosis.   Original Report Authenticated By: Marin Roberts, M.D.    Dg Chest 2 View  01/23/2013  *RADIOLOGY REPORT*  Clinical Data: Memory loss.  Altered mental status.  CHEST - 2 VIEW  Comparison: None.  Findings: Mild lingular scarring is noted.  Lungs otherwise clear. No evidence of pleural effusion.  Heart size and mediastinal contours are normal.  IMPRESSION: No active disease.   Original Report Authenticated By: Myles Rosenthal, M.D.    Ct Head Wo Contrast  01/23/2013  *RADIOLOGY REPORT*  Clinical Data: Amnesia and memory loss.  Postop from pelvic surgery several days ago.  CT HEAD WITHOUT CONTRAST  Technique:  Contiguous axial images were obtained from the base of the skull through the vertex without contrast.  Comparison: None.  Findings: A well circumscribed  area of cytotoxic edema is seen in the left parietal lobe, consistent with early subacute infarct. There is no evidence of hemorrhage, mass effect, or midline shift.  No other definite areas of acute to subacute cerebral infarction identified.  Chronic small vessel disease and old deep white matter infarcts are seen bilaterally.  No evidence of hydrocephalus or abnormal extra-axial fluid collections.  No skull abnormality identified.  IMPRESSION:  1.  Early subacute left parietal lobe infarct. 2.  No evidence of intracranial hemorrhage, mass effect, or midline shift. 3.  Chronic small vessel disease and old deep white matter infarcts.   Original Report Authenticated By: Myles Rosenthal, M.D.    Ct Angio Neck W/cm &/or Wo/cm  01/24/2013   *RADIOLOGY REPORT*  Clinical Data:  Hypertension.  Follow-up CVA.  Persistent expressive aphasia.  CT ANGIOGRAPHY HEAD AND NECK  Technique:  Multidetector CT imaging of the head and neck was performed using the standard protocol during bolus administration of intravenous contrast.  Multiplanar CT image reconstructions including MIPs were obtained to evaluate the vascular anatomy. Carotid stenosis measurements (when applicable) are obtained utilizing NASCET criteria, using the distal internal carotid diameter as the denominator.  Contrast: 50mL OMNIPAQUE IOHEXOL 350 MG/ML SOLN  Comparison:  CT head without contrast 01/23/2013.  CTA NECK  Findings:  A standard three-vessel arch configuration is present. The vertebral arteries originate from the subclavian arteries bilaterally.  The left vertebral artery is the dominant vessel. There are no significant stenoses within the neck.  The right common carotid artery is within normal limits. The bifurcation is unremarkable.  Right internal carotid artery is normal.  The left common carotid artery is within normal limits.  The there is mild irregularity at the bifurcation.  There is no significant stenosis relative to the distal vessel.  The soft tissues of the neck are unremarkable.  The lung apices are clear.  The bone windows are unremarkable.   Review of the MIP images confirms the above findings.  IMPRESSION:  1.  No significant stenosis of the cervical vasculature. 2.  Mild atherosclerotic irregularity of the left carotid bifurcation without significant stenosis.  CTA HEAD  Findings:  The posterior left frontal and parietal lobe cortical infarct is again noted.  There is no significant change in size. There is no hemorrhage.  Remote infarcts of the anterior basal ganglia are again noted bilaterally.  Additional periventricular white matter hypoattenuation is similar to the prior study.  The postcontrast images demonstrate no pathologic enhancement. There may be some  luxury perfusion within the posterior left MCA territory infarct.  Mild irregularity is present within the cavernous carotid arteries bilaterally.  The A1 and M1 segments are normal.  There is irregular segmental attenuation of MCA branch vessels bilaterally.  The posterior left MCA branch vessel is occluded, corresponding to the infarct territory.  The AICA vessels are dominant bilaterally.  The basilar artery is within normal limits.  The right posterior cerebral artery receives contributions from a posterior communicating artery and the P1 segment.  The left posterior cerebral artery originates from the basilar tip.  There is mild irregularity of distal PCA branch vessels without significant proximal stenosis or occlusion.   Review of the MIP images confirms the above findings.  IMPRESSION:  1.  Occluded distal left MCA branch vessel corresponding to the infarct territory. 2.  Moderate small vessel disease. 3.  Mild atherosclerotic irregularity within the cavernous carotid arteries bilaterally without significant stenosis.   Original Report Authenticated By: Marin Roberts, M.D.  Review of Systems  Constitutional: Negative for fever and chills.  Respiratory: Negative for cough and hemoptysis.   Cardiovascular: Negative for chest pain, palpitations, orthopnea, claudication and leg swelling.  Gastrointestinal: Negative for nausea, vomiting and abdominal pain.  Neurological: Negative for headaches.   Blood pressure 120/69, pulse 78, temperature 97.7 F (36.5 C), temperature source Oral, resp. rate 20, height 5\' 5"  (1.651 m), weight 123.016 kg (271 lb 3.2 oz), SpO2 96.00%. Physical Exam  Constitutional: She is oriented to person, place, and time. She appears well-developed and well-nourished.  HENT:  Head: Normocephalic and atraumatic.  Nose: Nose normal.  Mouth/Throat: No oropharyngeal exudate.  Eyes: Conjunctivae normal are normal. Pupils are equal, round, and reactive to light. Left eye  exhibits no discharge. No scleral icterus.  Neck: Normal range of motion. Neck supple. No JVD present. No tracheal deviation present. No thyromegaly present.  Cardiovascular:       Regular rate and rhythm S1-S2 soft.soft systolic murmur noted No S3 or S4 gallop noted  Respiratory: Effort normal and breath sounds normal. No respiratory distress. She has no wheezes. She has no rales.  GI: Soft. Bowel sounds are normal. She exhibits no distension. There is no tenderness. There is no rebound.  Musculoskeletal: She exhibits no edema and no tenderness.  Neurological: She is alert and oriented to person, place, and time.    Assessment/Plan: Status post subacute left parietal lobe infarct with occluded distal left MCA.rule out PFO/ASD Bilateral DVT Hypertension Morbid obesity Status post ovarian cyst resection Plan Continue present management We'll schedule for TEE if any slot available today Keep n.p.o. For now [facility] 01/25/2013, 10:23 AM

## 2013-01-25 NOTE — Evaluation (Signed)
Occupational Therapy Evaluation Patient Details Name: Mary [name] MRN: [id number] DOB: 1966-09-24 Today's Date: 01/25/2013 Time: 1191-4782 OT Time Calculation (min): 45 min  OT Assessment / Plan / Recommendation Clinical Impression  Pleasant 47 yr old female admitted with new onset left parietal CVA.  Presents with slight right field defict with testing and functional tasks.  Also with difficulty with receptive and expressive speech difficulties.  Demonstrates difficulty with sequencing higher level tasks as well.  Unable to begin writing out simulated check at this time.  Also suspect difficuties with organization and execution of meals or IADLs.  Feel she will benefit from acute care OT and also outpatient follow-up to further asses and work on these deficits.      OT Assessment  Patient needs continued OT Services    Follow Up Recommendations  Outpatient OT    Barriers to Discharge None    Equipment Recommendations  None recommended by OT       Frequency  Min 2X/week    Precautions / Restrictions Precautions Precaution Comments: pt with global aphasia, mild right field deficit   Pertinent Vitals/Pain O2 97% on room air, HR stable    ADL  Eating/Feeding: Simulated;Independent Where Assessed - Eating/Feeding: Chair Grooming: Performed;Supervision/safety;Teeth care Where Assessed - Grooming: Unsupported standing Upper Body Bathing: Simulated;Modified independent Where Assessed - Upper Body Bathing: Unsupported sitting Lower Body Bathing: Simulated;Modified independent Where Assessed - Lower Body Bathing: Unsupported standing Upper Body Dressing: Simulated;Modified independent Where Assessed - Upper Body Dressing: Supported sit to stand Lower Body Dressing: Performed;Modified independent Where Assessed - Lower Body Dressing: Unsupported sit to stand Toilet Transfer: Performed;Independent Toilet Transfer Method: Other (comment) (ambulate without assistive device) Toilet  Transfer Equipment: Regular height toilet Toileting - Clothing Manipulation and Hygiene: Simulated;Independent Where Assessed - Toileting Clothing Manipulation and Hygiene: Sit to stand from 3-in-1 or toilet Tub/Shower Transfer: Performed;Independent Tub/Shower Transfer Method: Ambulating Transfers/Ambulation Related to ADLs: Pt is overall independent with all mobility without assistive device. ADL Comments: Pt overall independent to modified independent with basic selfcare tasks.  Did report difficulty yesterday when attempting to sequence and brush her teeth.  Pt inconsistent with following simple commands for manual muscle testing and dysmetria testing.  Also unable to understand how to start with a simple check writing task.  Feel pt may have difficulaty with higher level cognitive sequencing tasks such as meal prep, grocery shopping.    OT Diagnosis: Disturbance of vision;Apraxia  OT Problem List: Decreased cognition;Other (comment);Impaired vision/perception (decreased motor planning) OT Treatment Interventions: Self-care/ADL training;Therapeutic activities;Cognitive remediation/compensation;Visual/perceptual remediation/compensation   OT Goals Acute Rehab OT Goals OT Goal Formulation: With patient Miscellaneous OT Goals Miscellaneous OT Goal #1: Pt will be able to perform simple money transactions/ check writing tasks with independence. OT Goal: Miscellaneous Goal #1 - Progress: Goal set today Miscellaneous OT Goal #2: Pt will compensate by peforming head turns to the right independently to avoid obstacles and environmental barriers.  OT Goal: Miscellaneous Goal #2 - Progress: Goal set today Miscellaneous OT Goal #3: Pt will peform full bathing and dressing with no more than one instructional cueing for sequencing and completing.   OT Goal: Miscellaneous Goal #3 - Progress: Goal set today  Visit Information  Last OT Received On: 01/25/13    Subjective Data  Subjective: I can't  get...Marland KitchenMarland KitchenMarland Kitchen Patient Stated Goal: Pt did not state but wants to be able to talk better.   Prior Functioning     Home Living Lives With: Family;Other (Comment) (mother) Available Help at Discharge:  Available 24 hours/day;Family Type of Home: House Home Access: Level entry Home Layout: Two level Alternate Level Stairs-Number of Steps: 17 Alternate Level Stairs-Rails: Right Bathroom Shower/Tub: Health visitor: Standard Bathroom Accessibility: Yes Home Adaptive Equipment: None Prior Function Level of Independence: Independent Able to Take Stairs?: Yes Driving: Yes Vocation: Unemployed Communication Communication: Expressive difficulties;Other (comment) (seemed to understand most/all of info I provided to her) Dominant Hand: Right         Vision/Perception Vision - History Baseline Vision: Wears glasses only for reading Patient Visual Report: No change from baseline Vision - Assessment Eye Alignment: Within Functional Limits Vision Assessment: Vision tested Ocular Range of Motion: Within Functional Limits Tracking/Visual Pursuits: Able to track stimulus in all quads without difficulty Visual Fields: Right homonymous hemianopsia Additional Comments: Pt with decreased ability to identify stimulus presented on the right side, several attempts.  Also bumped into a dynamap on the right side when ambulating in the hallway. Perception Perception: Within Functional Limits Praxis Praxis Impairment Details: Ideomotor   Cognition  Cognition Overall Cognitive Status: Appears within functional limits for tasks assessed/performed Arousal/Alertness: Awake/alert Orientation Level: Appears intact for tasks assessed Behavior During Session: [facility] for tasks performed Cognition - Other Comments: Pt with receptive and expressive difficulties    Extremity/Trunk Assessment Right Upper Extremity Assessment RUE ROM/Strength/Tone: Within functional levels RUE Sensation: WFL -  Light Touch RUE Coordination: WFL - gross/fine motor Left Upper Extremity Assessment LUE ROM/Strength/Tone: Within functional levels LUE Sensation: WFL - Light Touch LUE Coordination: WFL - gross/fine motor Trunk Assessment Trunk Assessment: Normal     Mobility Transfers Transfers: Sit to Stand Sit to Stand: 7: Independent Stand to Sit: 7: Independent        Balance Balance Balance Assessed: Yes Dynamic Standing Balance Dynamic Standing - Balance Support: No upper extremity supported Dynamic Standing - Level of Assistance: 7: Independent   End of Session OT - End of Session Activity Tolerance: Patient tolerated treatment well Patient left: in chair;with call bell/phone within reach;with nursing in room Nurse Communication: Mobility status     Layden Caterino OTR/L Pager number [phone] 01/25/2013, 1:05 PM

## 2013-01-25 NOTE — Care Management Note (Signed)
    Page 1 of 2   01/29/2013     4:43:23 PM   CARE MANAGEMENT NOTE 01/29/2013  Patient:  Mary [name], Mary [name]   Account Number:  [id number]  Date Initiated:  01/24/2013  Documentation initiated by:  [facility]  Subjective/Objective Assessment:   admitted with CVA     Action/Plan:   PT/OT evals-recommending outpatient PT and outpatient OT   Anticipated DC Date:  01/29/2013   Anticipated DC Plan:  HOME/SELF CARE  In-house referral  Financial Counselor      DC Planning Services  CM consult  OP Neuro Rehab  [facility]      Choice offered to / List presented to:             Status of service:  Completed, signed off Medicare Important Message given?   (If response is "NO", the following Medicare IM given date fields will be blank) Date Medicare IM given:   Date Additional Medicare IM given:    Discharge Disposition:  HOME/SELF CARE  Per UR Regulation:  Reviewed for med. necessity/level of care/duration of stay  If discussed at Long Length of Stay Meetings, dates discussed:   01/29/2013    Comments:  01/29/13 Obtaining xarelto and norvasc through [facility] program.Gave patient letter and explained that she needs to pick up meds at [facility] with $3per rx and picture ID. Completed Laural Benes and [employer] patient assistance application for xarelto and faxed it to [phone]. Gave patient financial assistance application for [facility] and faxed order, facesheet, Hand P, Pt eval, Ot eval and d/c summary to [phone]. Confirmed receipt with [facility], they will call patient to make appt. Gave patient phone # for [facility]. Made follow up appt with [facility] for 02/05/13 at 12 noon with Dr. Laural Benes. Gave patient  appt information. Jacquelynn Cree RN, BSN, [facility]     01/25/13 Spoke with patient about starting lovenox/coumadin.She would be able to afford $4 for coumadin but not Xarelto. Spoke with patient about PCP, she has not been to [facility] in  "awhile", no longer employed. Agreeable to clinic in [place]. I called [facility], [facility], and [facility] but none able to see her. Asked patient if she would be able to come to [facility] for appt for PT/INR checks. She stated that she would come to [place]. Called [facility], they do not do PT/INR visits. Called [facility] they would see the patient for PT/INR, cost would be $50 for visit and $18 for labs. Patient stated that she would be able to pay that if necessary. Per OT patient will need outpatient OT. Contacted [facility], they will see uninsured but she will need to fill out financial forms. They faxed forms to me. Will continue to follow patient for d/c needs. Jacquelynn Cree RN, BSN, CCM

## 2013-01-25 NOTE — Progress Notes (Signed)
  Echocardiogram 2D Echocardiogram has been performed.  Ellender Hose A 01/25/2013, 10:54 AM

## 2013-01-26 LAB — CBC
MCH: 22.9 pg — ABNORMAL LOW (ref 26.0–34.0)
MCHC: 32.9 g/dL (ref 30.0–36.0)
MCV: 69.6 fL — ABNORMAL LOW (ref 78.0–100.0)
Platelets: 269 10*3/uL (ref 150–400)
RBC: 4.84 MIL/uL (ref 3.87–5.11)

## 2013-01-26 LAB — PROTIME-INR: Prothrombin Time: 14.2 seconds (ref 11.6–15.2)

## 2013-01-26 MED ORDER — WARFARIN SODIUM 7.5 MG PO TABS
7.5000 mg | ORAL_TABLET | Freq: Once | ORAL | Status: AC
  Administered 2013-01-26: 7.5 mg via ORAL
  Filled 2013-01-26: qty 1

## 2013-01-26 MED ORDER — HEPARIN (PORCINE) IN NACL 100-0.45 UNIT/ML-% IJ SOLN
1650.0000 [IU]/h | INTRAMUSCULAR | Status: DC
  Administered 2013-01-26: 1200 [IU]/h via INTRAVENOUS
  Administered 2013-01-27 – 2013-01-29 (×3): 1650 [IU]/h via INTRAVENOUS
  Filled 2013-01-26 (×6): qty 250

## 2013-01-26 MED ORDER — HYDROCODONE-ACETAMINOPHEN 5-325 MG PO TABS
1.0000 | ORAL_TABLET | Freq: Four times a day (QID) | ORAL | Status: DC | PRN
  Filled 2013-01-26: qty 1

## 2013-01-26 MED ORDER — ACETAMINOPHEN 325 MG PO TABS
650.0000 mg | ORAL_TABLET | Freq: Four times a day (QID) | ORAL | Status: DC | PRN
  Administered 2013-01-26 – 2013-01-28 (×4): 650 mg via ORAL
  Filled 2013-01-26 (×4): qty 2

## 2013-01-26 NOTE — Progress Notes (Signed)
ANTICOAGULATION CONSULT NOTE - Follow Up Consult  Pharmacy Consult:  Heparin Indication:  Acute DVT and CVA  No Known Allergies  Patient Measurements: Height: 5\' 5"  (165.1 cm) Weight: 271 lb 3.2 oz (123.016 kg) IBW/kg (Calculated) : 57 Heparin Dosing Weight: 87 kg  Vital Signs: Temp: 98.3 F (36.8 C) (02/08 1432) Temp src: Oral (02/08 0612) BP: 143/78 mmHg (02/08 1432) Pulse Rate: 88 (02/08 1432)  Labs:  Recent Labs  01/23/13 2027 01/24/13 0555 01/25/13 0615 01/25/13 1305 01/26/13 0244 01/26/13 1351  HGB 13.1 11.8* 11.7*  --  11.1*  --   HCT 38.6 35.4* 35.2*  --  33.7*  --   PLT 291 258 265  --  269  --   LABPROT  --   --   --  14.1 14.2  --   INR  --   --   --  1.10 1.11  --   HEPARINUNFRC  --   --   --   --  0.69 0.20*  CREATININE 0.79 0.69  --   --   --   --     Estimated Creatinine Clearance: 114.5 ml/min (by C-G formula based on Cr of 0.69).      Assessment: 46 female with new DVT of bilateral posterior tibial veins to continue on IV Heparin.  Patient also had a CVA, no hemorrhage seen on head CT.  Heparin level sub-therapeutic; no bleeding reported.  No complications with therapy per RN.     Goal of Therapy:  HL 0.3 - 0.5 units/mL Monitor platelets by anticoagulation protocol: Yes    Plan:  - Increase heparin gtt to 1200 units/hr - Check 6 hr HL - Coumadin 7.5mg  PO today as ordered - Daily HL / CBC / PT / INR    Bertel Venard D. Laney Potash, PharmD, BCPS Pager:  [phone] 01/26/2013, 2:41 PM

## 2013-01-26 NOTE — Progress Notes (Signed)
Stroke Team Progress Note  HISTORY Mary [name] is a 47 y.o. female who presents with several days of difficulty speaking. She had an ovarian cyst removed on Monday 01/21/13, and immediately following surgery her family noticed that she was having difficulty with her speech, those relatively mild. She is discharged, however did not improve after she got home and therefore her family has brought her back in for reevaluation tonight 01/23/2013. She states that she has trouble both with getting her words out, as well as understanding other people  She had a CT scan showing a subacute left parietal infarct. She had previously been prescribed phentermine, however has never taken it. Patient was not a TPA candidate secondary to delay in arrival. She was admitted for further evaluation and treatment.  SUBJECTIVE The patient is alone in her room. She states her mother will be arriving soon. She feels she is still having some difficulty with expressive and receptive aphasia; however, this seems to be improving.   OBJECTIVE Most recent Vital Signs: Filed Vitals:   01/25/13 1854 01/25/13 2132 01/26/13 0200 01/26/13 0612  BP:  94/78 113/54 121/60  Pulse: 97 94 89 80  Temp: 98.2 F (36.8 C) 98.1 F (36.7 C)  98.6 F (37 C)  TempSrc:  Oral  Oral  Resp: 20 18 18 20   Height:      Weight:      SpO2: 97% 97% 98% 95%   CBG (last 3)  No results found for this basename: GLUCAP,  in the last 72 hours  IV Fluid Intake:   . heparin 1,100 Units/hr (01/26/13 0610)    MEDICATIONS . amLODipine  10 mg Oral q morning - 10a  . warfarin  7.5 mg Oral ONCE-1800  . warfarin   Does not apply Once  . Warfarin - Pharmacist Dosing Inpatient   Does not apply q1800   PRN:  senna-docusate  Diet:  Cardiac thin liquids Activity:  As tolerated DVT Prophylaxis:  SCDs   CLINICALLY SIGNIFICANT STUDIES Basic Metabolic Panel:   Recent Labs Lab 01/23/13 2027 01/24/13 0555  NA 137 139  K 3.2* 3.0*  CL 100 103  CO2  26 25  GLUCOSE 108* 114*  BUN 11 11  CREATININE 0.79 0.69  CALCIUM 9.0 8.7   Liver Function Tests: No results found for this basename: AST, ALT, ALKPHOS, BILITOT, PROT, ALBUMIN,  in the last 168 hours CBC:   Recent Labs Lab 01/23/13 2027 01/24/13 0555 01/25/13 0615 01/26/13 0244  WBC 6.7 6.1 5.4 5.9  NEUTROABS 4.1 3.7  --   --   HGB 13.1 11.8* 11.7* 11.1*  HCT 38.6 35.4* 35.2* 33.7*  MCV 69.9* 69.0* 69.8* 69.6*  PLT 291 258 265 269   Coagulation:   Recent Labs Lab 01/25/13 1305 01/26/13 0244  LABPROT 14.1 14.2  INR 1.10 1.11   Cardiac Enzymes: No results found for this basename: CKTOTAL, CKMB, CKMBINDEX, TROPONINI,  in the last 168 hours Urinalysis:   Recent Labs Lab 01/24/13 0607  COLORURINE YELLOW  LABSPEC 1.017  PHURINE 5.5  GLUCOSEU NEGATIVE  HGBUR LARGE*  BILIRUBINUR NEGATIVE  KETONESUR NEGATIVE  PROTEINUR NEGATIVE  UROBILINOGEN 1.0  NITRITE NEGATIVE  LEUKOCYTESUR NEGATIVE   Lipid Panel    Component Value Date/Time   CHOL 128 01/24/2013 0555   TRIG 108 01/24/2013 0555   HDL 41 01/24/2013 0555   CHOLHDL 3.1 01/24/2013 0555   VLDL 22 01/24/2013 0555   LDLCALC 65 01/24/2013 0555   HgbA1C  Lab Results  Component Value Date   HGBA1C 6.1* 01/24/2013    Urine Drug Screen:   No results found for this basename: labopia,  cocainscrnur,  labbenz,  amphetmu,  thcu,  labbarb    Alcohol Level: No results found for this basename: ETH,  in the last 168 hours  Hypercoagulable Panel: Normal:  C3, C4, ANA, RPR, HIV, Anti III, homocysteine ESR - 26 Pending:  CH50 Abnormal  Prot C, Prot S, lupus anticoagulant, cardiolipin antibody  Pregnancy screen - negative  CT of the brain  01/23/2013   1.  Early subacute left parietal lobe infarct. 2.  No evidence of intracranial hemorrhage, mass effect, or midline shift. 3.  Chronic small vessel disease and old deep white matter infarcts.    CT angio of the head 1. Occluded distal left MCA branch vessel corresponding to the  infarct territory. 2. Moderate small vessel disease. 3. Mild atherosclerotic irregularity within the cavernous carotid arteries bilaterally without significant stenosis.  CT angio of the neck 1. No significant stenosis of the cervical vasculature. 2. Mild atherosclerotic irregularity of the left carotid bifurcation without significant stenosis.  MRI of the brain  Open MRI to be done on an outpatient basis secondary to severe claustrophobia.   2D Echocardiogram  ejection fraction 60-65%. No source of emboli noted. TEE is planned.  Carotid Doppler  See CTA of the neck  LE venous dopplers  Bilateral lower extremities are positive for deep vein thrmbosis involving bilateral posterior tibial veins. Unable to visualize bilateral femoral veins due to technical limitations, therefore unable to exclude deep vein thrombosis in these veins.  TEE    IMPRESSION:  1.Normal LV systolic function.  2.No vegetation, clot or PFO.  3.Minimal atherosclerosis in descending thoracic aorta.  CXR  01/23/2013   No active disease.     EKG  normal sinus rhythm.   Therapy Recommendations - Outpatient physical therapy with 24-hour per day supervision/assistance recommended.  Physical Exam   Pleasant middle aged  [place] lady currently not in distress. Appears frustrated from her speech difficulties.Awake alert. Afebrile. Head is nontraumatic. Neck is supple without bruit. Hearing is normal. Cardiac exam no murmur or gallop. Lungs are clear to auscultation. Distal pulses are well felt. Neurological Exam :  Awake alert oriented x3. Moderate expressive aphasia with significant nonfluent speech with word finding difficulties and paraphasic errors. Able to name, repeat and comprehend better with mild deficits. No dysarthria. Extraocular moments are full range without nystagmus. Fundi were not visualized. Vision acuity and fields appear normal. Face is symmetric. Tongue is midline. Motor system exam reveals no upper  or lower expected drift. His symmetric and equal strength in all 4 extremities. No focal weakness.  General - pleasant 47 year old female in no acute distress. Heart - Regular rate and rhythm - 2/6 systolic murmur Lungs - Clear to auscultation but slightly decreased Extremities - Distal pulses weak to absent bilaterally trace edema. Skin - Warm and dry    ASSESSMENT [name] is a 47 y.o. female presenting with speech difficulties, trouble getting words out and trouble understanding. Imaging confirms a left parietal lobe infarct. Infarct felt to be embolic. Patient with bilateral LE VTE, if PFO present, these LE VTEs could be associated with her stroke. If no PFO, other hypercoagulable source likely. On no antiplatelets prior to admission. Now on Lovenox to be converted to intravenous heparin with subsequent Coumadin therapy per pharmacy for secondary stroke prevention. Patient with resultant expressive and receptive aphasia (paraphasic errors) and right  visual field cut. Work up underway.  Hypertension Obesity, Body mass index is 45.13 kg/(m^2). Claustrophobia - outpatient open MRI planned  Hospital day # 3  CT evaluation shows evidence of a left parietal stroke associated with a mild aphasia syndrome and a visual field deficit off to the right. The patient has been able to ambulate fairly well with physical therapy. OT and PT as recommended outpatient therapy. Rehabilitation consult indicates that the patient is too high level for inpatient rehabilitation. The patient was not on antiplatelet agents prior to coming into the hospital. The patient is going on Coumadin at this time. The stroke may have been related to the hypercoagulable state. No PFO noted by TEE.   TREATMENT/PLAN  Change  full dose lovenox to IV heparin / going to Coumadin for secondary stroke prevention. Prefer new oral anticoagulant in case manager can make arrangements for coverage, otherwise, need to start  coumadin today.  Hypercoagulable panel - Abnormally high protein C 153, abnormally low protein S. 55,  Positive lupus anticoagulant, Cardiolipin abnormally low, Sedimentation rate elevated at 26. Do open MRI as an OP in the future  Hassel Neth [facility] Pager [phone] 01/26/2013, 9:13 AM  I have personally obtained a history, examined the patient, evaluated imaging results, and formulated the assessment and plan of care. I agree with the above. Lesly Dukes

## 2013-01-26 NOTE — Progress Notes (Addendum)
Subjective:  Denies any chest pain or shortness of breath. Speech gradually improving Tolerated transesophageal echo which was negative for PFO or atrial septal defect Objective:  Vital Signs in the last 24 hours: Temp:  [98 F (36.7 C)-98.8 F (37.1 C)] 98.2 F (36.8 C) (02/08 1016) Pulse Rate:  [75-97] 84 (02/08 1016) Resp:  [9-30] 20 (02/08 1016) BP: (94-153)/(54-96) 116/80 mmHg (02/08 1016) SpO2:  [91 %-99 %] 99 % (02/08 1016)  Intake/Output from previous day: 02/07 0701 - 02/08 0700 In: 630 [P.O.:480; I.V.:150] Out: 3 [Urine:3] Intake/Output from this shift: Total I/O In: 240 [P.O.:240] Out: -   Physical Exam: Neck: no adenopathy, no carotid bruit, no JVD and supple, symmetrical, trachea midline Lungs: clear to auscultation bilaterally Heart: regular rate and rhythm, S1, S2 normal and Soft systolic murmur noted Abdomen: soft, non-tender; bowel sounds normal; no masses,  no organomegaly Extremities: extremities normal, atraumatic, no cyanosis or edema  Lab Results:  Recent Labs  01/25/13 0615 01/26/13 0244  WBC 5.4 5.9  HGB 11.7* 11.1*  PLT 265 269    Recent Labs  01/23/13 2027 01/24/13 0555  NA 137 139  K 3.2* 3.0*  CL 100 103  CO2 26 25  GLUCOSE 108* 114*  BUN 11 11  CREATININE 0.79 0.69   No results found for this basename: TROPONINI, CK, MB,  in the last 72 hours Hepatic Function Panel No results found for this basename: PROT, ALBUMIN, AST, ALT, ALKPHOS, BILITOT, BILIDIR, IBILI,  in the last 72 hours  Recent Labs  01/24/13 0555  CHOL 128   No results found for this basename: PROTIME,  in the last 72 hours  Imaging: Imaging results have been reviewed and Ct Angio Head W/cm &/or Wo Cm  01/24/2013  *RADIOLOGY REPORT*  Clinical Data:  Hypertension.  Follow-up CVA.  Persistent expressive aphasia.  CT ANGIOGRAPHY HEAD AND NECK  Technique:  Multidetector CT imaging of the head and neck was performed using the standard protocol during bolus  administration of intravenous contrast.  Multiplanar CT image reconstructions including MIPs were obtained to evaluate the vascular anatomy. Carotid stenosis measurements (when applicable) are obtained utilizing NASCET criteria, using the distal internal carotid diameter as the denominator.  Contrast: 50mL OMNIPAQUE IOHEXOL 350 MG/ML SOLN  Comparison:  CT head without contrast 01/23/2013.  CTA NECK  Findings:  A standard three-vessel arch configuration is present. The vertebral arteries originate from the subclavian arteries bilaterally.  The left vertebral artery is the dominant vessel. There are no significant stenoses within the neck.  The right common carotid artery is within normal limits. The bifurcation is unremarkable.  Right internal carotid artery is normal.  The left common carotid artery is within normal limits.  The there is mild irregularity at the bifurcation.  There is no significant stenosis relative to the distal vessel.  The soft tissues of the neck are unremarkable.  The lung apices are clear.  The bone windows are unremarkable.   Review of the MIP images confirms the above findings.  IMPRESSION:  1.  No significant stenosis of the cervical vasculature. 2.  Mild atherosclerotic irregularity of the left carotid bifurcation without significant stenosis.  CTA HEAD  Findings:  The posterior left frontal and parietal lobe cortical infarct is again noted.  There is no significant change in size. There is no hemorrhage.  Remote infarcts of the anterior basal ganglia are again noted bilaterally.  Additional periventricular white matter hypoattenuation is similar to the prior study.  The postcontrast images  demonstrate no pathologic enhancement. There may be some luxury perfusion within the posterior left MCA territory infarct.  Mild irregularity is present within the cavernous carotid arteries bilaterally.  The A1 and M1 segments are normal.  There is irregular segmental attenuation of MCA branch vessels  bilaterally.  The posterior left MCA branch vessel is occluded, corresponding to the infarct territory.  The AICA vessels are dominant bilaterally.  The basilar artery is within normal limits.  The right posterior cerebral artery receives contributions from a posterior communicating artery and the P1 segment.  The left posterior cerebral artery originates from the basilar tip.  There is mild irregularity of distal PCA branch vessels without significant proximal stenosis or occlusion.   Review of the MIP images confirms the above findings.  IMPRESSION:  1.  Occluded distal left MCA branch vessel corresponding to the infarct territory. 2.  Moderate small vessel disease. 3.  Mild atherosclerotic irregularity within the cavernous carotid arteries bilaterally without significant stenosis.   Original Report Authenticated By: Marin Roberts, M.D.    Ct Angio Neck W/cm &/or Wo/cm  01/24/2013  *RADIOLOGY REPORT*  Clinical Data:  Hypertension.  Follow-up CVA.  Persistent expressive aphasia.  CT ANGIOGRAPHY HEAD AND NECK  Technique:  Multidetector CT imaging of the head and neck was performed using the standard protocol during bolus administration of intravenous contrast.  Multiplanar CT image reconstructions including MIPs were obtained to evaluate the vascular anatomy. Carotid stenosis measurements (when applicable) are obtained utilizing NASCET criteria, using the distal internal carotid diameter as the denominator.  Contrast: 50mL OMNIPAQUE IOHEXOL 350 MG/ML SOLN  Comparison:  CT head without contrast 01/23/2013.  CTA NECK  Findings:  A standard three-vessel arch configuration is present. The vertebral arteries originate from the subclavian arteries bilaterally.  The left vertebral artery is the dominant vessel. There are no significant stenoses within the neck.  The right common carotid artery is within normal limits. The bifurcation is unremarkable.  Right internal carotid artery is normal.  The left common  carotid artery is within normal limits.  The there is mild irregularity at the bifurcation.  There is no significant stenosis relative to the distal vessel.  The soft tissues of the neck are unremarkable.  The lung apices are clear.  The bone windows are unremarkable.   Review of the MIP images confirms the above findings.  IMPRESSION:  1.  No significant stenosis of the cervical vasculature. 2.  Mild atherosclerotic irregularity of the left carotid bifurcation without significant stenosis.  CTA HEAD  Findings:  The posterior left frontal and parietal lobe cortical infarct is again noted.  There is no significant change in size. There is no hemorrhage.  Remote infarcts of the anterior basal ganglia are again noted bilaterally.  Additional periventricular white matter hypoattenuation is similar to the prior study.  The postcontrast images demonstrate no pathologic enhancement. There may be some luxury perfusion within the posterior left MCA territory infarct.  Mild irregularity is present within the cavernous carotid arteries bilaterally.  The A1 and M1 segments are normal.  There is irregular segmental attenuation of MCA branch vessels bilaterally.  The posterior left MCA branch vessel is occluded, corresponding to the infarct territory.  The AICA vessels are dominant bilaterally.  The basilar artery is within normal limits.  The right posterior cerebral artery receives contributions from a posterior communicating artery and the P1 segment.  The left posterior cerebral artery originates from the basilar tip.  There is mild irregularity of distal PCA branch  vessels without significant proximal stenosis or occlusion.   Review of the MIP images confirms the above findings.  IMPRESSION:  1.  Occluded distal left MCA branch vessel corresponding to the infarct territory. 2.  Moderate small vessel disease. 3.  Mild atherosclerotic irregularity within the cavernous carotid arteries bilaterally without significant stenosis.    Original Report Authenticated By: Marin Roberts, M.D.     Cardiac Studies:  Assessment/Plan:  Status post subacute left parietal lobe infarct with occluded distal left MCA. Status post TEE which was negative for PFO or ASD Bilateral DVT  Hypertension  Morbid obesity  Status post ovarian cyst resection  Plan Continue present management as per neurology I will sign off please call if needed  LOS: 3 days    Mary [name] 01/26/2013, 11:12 AM

## 2013-01-26 NOTE — Progress Notes (Signed)
[facility] PROGRESS NOTE  Mary [name] ZOX:[id number] DOB: [birth year] DOA: 01/23/2013 PCP: No primary provider on file.  Assessment/Plan: Principal Problem:   CVA (cerebral infarction) Active Problems:   HTN (hypertension)    1. CVA - patient has been placed on neurochecks, swallow evaluation. On aspirin. Hypercoagulable workup is pending.  TEE negative for PFO, discussed with Dr. Sharyn Lull he will see her today. MRI canceled per stroke team recommendations,  Will need open  MRI outpatient. CT angiography of the neck shows no significant stenosis except for mild irregularity of the left carotid bifurcation. Occluded distal left MCA consistent with a stroke territory.  neurology recommends IV heparin and Coumadin, no Lovenox. Given bilateral DVT, patient will need to stay on IV heparin for 5 day overlap. Coumadin per pharmacy  ,#2. Hypertension -able on Norvasc  #3. Recent ovarian cyst surgery - can contact [facility]. Chester Holstein at [phone] for further details   #4 bilateral DVT started on Lovenox, switched  to heparin gtt  , cannot switch to xarelto or pradaxa  as the patient cannot afford these medications  Code Status: full  Family Communication: family updated about patient's clinical progress  Disposition Plan: As above    Brief narrative:  47 y.o. female who presents with several days of difficulty speaking. She had an ovarian cyst removed on Monday 01/21/13, and immediately following surgery her family noticed that she was having difficulty with her speech, those relatively mild. She is discharged, however did not improve after she got home and therefore her family has brought her back in for reevaluation tonight 01/23/2013. She states that she has trouble both with getting her words out, as well as understanding other people  She had a CT scan showing a subacute left parietal infarct. She had previously been prescribed phentermine, however has never taken it. Patient  was not a TPA candidate secondary to delay in arrival. She was admitted for further evaluation and treatment.  Consultants:  Neurology  Cardiology  Procedures:  None Antibiotics:  None HPI/Subjective:  still has significant expressive aphasic  Objective: Filed Vitals:   01/25/13 1854 01/25/13 2132 01/26/13 0200 01/26/13 0612  BP:  94/78 113/54 121/60  Pulse: 97 94 89 80  Temp: 98.2 F (36.8 C) 98.1 F (36.7 C)  98.6 F (37 C)  TempSrc:  Oral  Oral  Resp: 20 18 18 20   Height:      Weight:      SpO2: 97% 97% 98% 95%    Intake/Output Summary (Last 24 hours) at 01/26/13 0905 Last data filed at 01/26/13 0819  Gross per 24 hour  Intake    630 ml  Output      3 ml  Net    627 ml    Exam:  HENT:  Head: Atraumatic.  Nose: Nose normal.  Mouth/Throat: Oropharynx is clear and moist.  Eyes: Conjunctivae are normal. Pupils are equal, round, and reactive to light. No scleral icterus.  Neck: Neck supple. No tracheal deviation present.  Cardiovascular: Normal rate, regular rhythm, normal heart sounds and intact distal pulses.  Pulmonary/Chest: Effort normal and breath sounds normal. No respiratory distress.  Abdominal: Soft. Normal appearance and bowel sounds are normal. She exhibits no distension. There is no tenderness.  Musculoskeletal: She exhibits no edema and no tenderness.  Neurological: She is alert. No cranial nerve deficit.    Data Reviewed: Basic Metabolic Panel:  Recent Labs Lab 01/23/13 2027 01/24/13 0555  NA 137 139  K 3.2*  3.0*  CL 100 103  CO2 26 25  GLUCOSE 108* 114*  BUN 11 11  CREATININE 0.79 0.69  CALCIUM 9.0 8.7    Liver Function Tests: No results found for this basename: AST, ALT, ALKPHOS, BILITOT, PROT, ALBUMIN,  in the last 168 hours No results found for this basename: LIPASE, AMYLASE,  in the last 168 hours No results found for this basename: AMMONIA,  in the last 168 hours  CBC:  Recent Labs Lab 01/23/13 2027 01/24/13 0555  01/25/13 0615 01/26/13 0244  WBC 6.7 6.1 5.4 5.9  NEUTROABS 4.1 3.7  --   --   HGB 13.1 11.8* 11.7* 11.1*  HCT 38.6 35.4* 35.2* 33.7*  MCV 69.9* 69.0* 69.8* 69.6*  PLT 291 258 265 269    Cardiac Enzymes: No results found for this basename: CKTOTAL, CKMB, CKMBINDEX, TROPONINI,  in the last 168 hours BNP (last 3 results) No results found for this basename: PROBNP,  in the last 8760 hours   CBG: No results found for this basename: GLUCAP,  in the last 168 hours  No results found for this or any previous visit (from the past 240 hour(s)).   Studies: Ct Angio Head W/cm &/or Wo Cm  01/24/2013  *RADIOLOGY REPORT*  Clinical Data:  Hypertension.  Follow-up CVA.  Persistent expressive aphasia.  CT ANGIOGRAPHY HEAD AND NECK  Technique:  Multidetector CT imaging of the head and neck was performed using the standard protocol during bolus administration of intravenous contrast.  Multiplanar CT image reconstructions including MIPs were obtained to evaluate the vascular anatomy. Carotid stenosis measurements (when applicable) are obtained utilizing NASCET criteria, using the distal internal carotid diameter as the denominator.  Contrast: 50mL OMNIPAQUE IOHEXOL 350 MG/ML SOLN  Comparison:  CT head without contrast 01/23/2013.  CTA NECK  Findings:  A standard three-vessel arch configuration is present. The vertebral arteries originate from the subclavian arteries bilaterally.  The left vertebral artery is the dominant vessel. There are no significant stenoses within the neck.  The right common carotid artery is within normal limits. The bifurcation is unremarkable.  Right internal carotid artery is normal.  The left common carotid artery is within normal limits.  The there is mild irregularity at the bifurcation.  There is no significant stenosis relative to the distal vessel.  The soft tissues of the neck are unremarkable.  The lung apices are clear.  The bone windows are unremarkable.   Review of the MIP  images confirms the above findings.  IMPRESSION:  1.  No significant stenosis of the cervical vasculature. 2.  Mild atherosclerotic irregularity of the left carotid bifurcation without significant stenosis.  CTA HEAD  Findings:  The posterior left frontal and parietal lobe cortical infarct is again noted.  There is no significant change in size. There is no hemorrhage.  Remote infarcts of the anterior basal ganglia are again noted bilaterally.  Additional periventricular white matter hypoattenuation is similar to the prior study.  The postcontrast images demonstrate no pathologic enhancement. There may be some luxury perfusion within the posterior left MCA territory infarct.  Mild irregularity is present within the cavernous carotid arteries bilaterally.  The A1 and M1 segments are normal.  There is irregular segmental attenuation of MCA branch vessels bilaterally.  The posterior left MCA branch vessel is occluded, corresponding to the infarct territory.  The AICA vessels are dominant bilaterally.  The basilar artery is within normal limits.  The right posterior cerebral artery receives contributions from a posterior communicating artery  and the P1 segment.  The left posterior cerebral artery originates from the basilar tip.  There is mild irregularity of distal PCA branch vessels without significant proximal stenosis or occlusion.   Review of the MIP images confirms the above findings.  IMPRESSION:  1.  Occluded distal left MCA branch vessel corresponding to the infarct territory. 2.  Moderate small vessel disease. 3.  Mild atherosclerotic irregularity within the cavernous carotid arteries bilaterally without significant stenosis.   Original Report Authenticated By: Marin Roberts, M.D.    Dg Chest 2 View  01/23/2013  *RADIOLOGY REPORT*  Clinical Data: Memory loss.  Altered mental status.  CHEST - 2 VIEW  Comparison: None.  Findings: Mild lingular scarring is noted.  Lungs otherwise clear. No evidence of  pleural effusion.  Heart size and mediastinal contours are normal.  IMPRESSION: No active disease.   Original Report Authenticated By: Myles Rosenthal, M.D.    Ct Head Wo Contrast  01/23/2013  *RADIOLOGY REPORT*  Clinical Data: Amnesia and memory loss.  Postop from pelvic surgery several days ago.  CT HEAD WITHOUT CONTRAST  Technique:  Contiguous axial images were obtained from the base of the skull through the vertex without contrast.  Comparison: None.  Findings: A well circumscribed area of cytotoxic edema is seen in the left parietal lobe, consistent with early subacute infarct. There is no evidence of hemorrhage, mass effect, or midline shift.  No other definite areas of acute to subacute cerebral infarction identified.  Chronic small vessel disease and old deep white matter infarcts are seen bilaterally.  No evidence of hydrocephalus or abnormal extra-axial fluid collections.  No skull abnormality identified.  IMPRESSION:  1.  Early subacute left parietal lobe infarct. 2.  No evidence of intracranial hemorrhage, mass effect, or midline shift. 3.  Chronic small vessel disease and old deep white matter infarcts.   Original Report Authenticated By: Myles Rosenthal, M.D.    Ct Angio Neck W/cm &/or Wo/cm  01/24/2013  *RADIOLOGY REPORT*  Clinical Data:  Hypertension.  Follow-up CVA.  Persistent expressive aphasia.  CT ANGIOGRAPHY HEAD AND NECK  Technique:  Multidetector CT imaging of the head and neck was performed using the standard protocol during bolus administration of intravenous contrast.  Multiplanar CT image reconstructions including MIPs were obtained to evaluate the vascular anatomy. Carotid stenosis measurements (when applicable) are obtained utilizing NASCET criteria, using the distal internal carotid diameter as the denominator.  Contrast: 50mL OMNIPAQUE IOHEXOL 350 MG/ML SOLN  Comparison:  CT head without contrast 01/23/2013.  CTA NECK  Findings:  A standard three-vessel arch configuration is present. The  vertebral arteries originate from the subclavian arteries bilaterally.  The left vertebral artery is the dominant vessel. There are no significant stenoses within the neck.  The right common carotid artery is within normal limits. The bifurcation is unremarkable.  Right internal carotid artery is normal.  The left common carotid artery is within normal limits.  The there is mild irregularity at the bifurcation.  There is no significant stenosis relative to the distal vessel.  The soft tissues of the neck are unremarkable.  The lung apices are clear.  The bone windows are unremarkable.   Review of the MIP images confirms the above findings.  IMPRESSION:  1.  No significant stenosis of the cervical vasculature. 2.  Mild atherosclerotic irregularity of the left carotid bifurcation without significant stenosis.  CTA HEAD  Findings:  The posterior left frontal and parietal lobe cortical infarct is again noted.  There is no  significant change in size. There is no hemorrhage.  Remote infarcts of the anterior basal ganglia are again noted bilaterally.  Additional periventricular white matter hypoattenuation is similar to the prior study.  The postcontrast images demonstrate no pathologic enhancement. There may be some luxury perfusion within the posterior left MCA territory infarct.  Mild irregularity is present within the cavernous carotid arteries bilaterally.  The A1 and M1 segments are normal.  There is irregular segmental attenuation of MCA branch vessels bilaterally.  The posterior left MCA branch vessel is occluded, corresponding to the infarct territory.  The AICA vessels are dominant bilaterally.  The basilar artery is within normal limits.  The right posterior cerebral artery receives contributions from a posterior communicating artery and the P1 segment.  The left posterior cerebral artery originates from the basilar tip.  There is mild irregularity of distal PCA branch vessels without significant proximal  stenosis or occlusion.   Review of the MIP images confirms the above findings.  IMPRESSION:  1.  Occluded distal left MCA branch vessel corresponding to the infarct territory. 2.  Moderate small vessel disease. 3.  Mild atherosclerotic irregularity within the cavernous carotid arteries bilaterally without significant stenosis.   Original Report Authenticated By: Marin Roberts, M.D.     Scheduled Meds: . amLODipine  10 mg Oral q morning - 10a  . warfarin  7.5 mg Oral ONCE-1800  . warfarin   Does not apply Once  . Warfarin - Pharmacist Dosing Inpatient   Does not apply q1800   Continuous Infusions: . heparin 1,100 Units/hr (01/26/13 0610)    Principal Problem:   CVA (cerebral infarction) Active Problems:   HTN (hypertension)    Time spent: 40 minutes   [facility]  [facility] Pager [phone]. If 8PM-8AM, please contact night-coverage at [email or web address], password [facility] 01/26/2013, 9:05 AM  LOS: 3 days

## 2013-01-26 NOTE — Progress Notes (Signed)
ANTICOAGULATION CONSULT NOTE - Follow Up Consult  Pharmacy Consult:  Heparin Indication:  Acute DVT and CVA  No Known Allergies  Patient Measurements: Height: 5\' 5"  (165.1 cm) Weight: 271 lb 3.2 oz (123.016 kg) IBW/kg (Calculated) : 57 Heparin Dosing Weight: 87 kg  Vital Signs: Temp: 97.9 F (36.6 C) (02/08 1857) BP: 128/67 mmHg (02/08 1857) Pulse Rate: 88 (02/08 1857)  Labs:  Recent Labs  01/24/13 0555 01/25/13 0615 01/25/13 1305 01/26/13 0244 01/26/13 1351 01/26/13 2020  HGB 11.8* 11.7*  --  11.1*  --   --   HCT 35.4* 35.2*  --  33.7*  --   --   PLT 258 265  --  269  --   --   LABPROT  --   --  14.1 14.2  --   --   INR  --   --  1.10 1.11  --   --   HEPARINUNFRC  --   --   --  0.69 0.20* 0.16*  CREATININE 0.69  --   --   --   --   --     Estimated Creatinine Clearance: 114.5 ml/min (by C-G formula based on Cr of 0.69).   Assessment: 9 female with new DVT of bilateral posterior tibial veins to continue on IV Heparin.  Patient also had a CVA, no hemorrhage seen on head CT.    Heparin level still sub-therapeutic at 0.16     Goal of Therapy:  HL 0.3 - 0.5 units/mL Monitor platelets by anticoagulation protocol: Yes   Plan:  - Increase heparin gtt to 1450 units/hr - Daily HL / CBC / PT / INR  Thank you. Okey Regal, PharmD [phone]  01/26/2013, 9:32 PM

## 2013-01-26 NOTE — Progress Notes (Signed)
ANTICOAGULATION CONSULT NOTE - Follow Up Consult  Pharmacy Consult for Lovenox --> Heparin/Coumadin Indication: acute DVT and CVA  No Known Allergies  Patient Measurements: Height: 5\' 5"  (165.1 cm) Weight: 271 lb 3.2 oz (123.016 kg) IBW/kg (Calculated) : 57 Heparin Dosing Weight: 87kg  Vital Signs: Temp: 98.1 F (36.7 C) (02/07 2132) Temp src: Oral (02/07 2132) BP: 113/54 mmHg (02/08 0200) Pulse Rate: 89 (02/08 0200)  Labs:  Recent Labs  01/23/13 2027 01/24/13 0555 01/25/13 0615 01/25/13 1305 01/26/13 0244  HGB 13.1 11.8* 11.7*  --  11.1*  HCT 38.6 35.4* 35.2*  --  33.7*  PLT 291 258 265  --  269  LABPROT  --   --   --  14.1 14.2  INR  --   --   --  1.10 1.11  HEPARINUNFRC  --   --   --   --  0.69  CREATININE 0.79 0.69  --   --   --     Estimated Creatinine Clearance: 114.5 ml/min (by C-G formula based on Cr of 0.69).  Assessment: 46yof admitted with acute DVT and CVA to transition from Lovenox to Heparin and Coumadin. Per Stroke team, patient's insurance will not pay for newer anticoagulants. Patient received last Lovenox dose this AM (~1019) - will initiate heparin drip later today targeting lower heparin levels (0.3-0.5) due to acute CVA.  Initial heparin level is 0.69 and above desire set goal noted above.  Her H/H is stable and she has had no noted bleeding or IV issues per RN.  Goal of Therapy:  INR 2-3 Heparin level 0.3-0.5 units/ml Monitor platelets by anticoagulation protocol: Yes   Plan:  1. Decrease IV Heparin drip to 1100 units/hr 2. Check heparin level ~8 hrs after rate change. 3. Coumadin 7.5mg  po x 1 today 4. Daily heparin level, CBC, INR 5. Coumadin educational book/video 6. Discussed ASA with Biby PA - received order to discontinue  Chinita Greenland [phone] 01/26/2013,5:55 AM

## 2013-01-27 LAB — CBC
MCH: 22.7 pg — ABNORMAL LOW (ref 26.0–34.0)
MCHC: 32.7 g/dL (ref 30.0–36.0)
MCV: 69.4 fL — ABNORMAL LOW (ref 78.0–100.0)
Platelets: 248 10*3/uL (ref 150–400)
RDW: 16.3 % — ABNORMAL HIGH (ref 11.5–15.5)
WBC: 5 10*3/uL (ref 4.0–10.5)

## 2013-01-27 LAB — COMPREHENSIVE METABOLIC PANEL
BUN: 7 mg/dL (ref 6–23)
Calcium: 8.8 mg/dL (ref 8.4–10.5)
Creatinine, Ser: 0.69 mg/dL (ref 0.50–1.10)
GFR calc Af Amer: >90 mL/min (ref >90)
GFR calc non Af Amer: >90 mL/min (ref >90)
Glucose, Bld: 117 mg/dL — ABNORMAL HIGH (ref 70–99)
Sodium: 139 mEq/L (ref 135–145)
Total Protein: 7.5 g/dL (ref 6.0–8.3)

## 2013-01-27 LAB — HEPARIN LEVEL (UNFRACTIONATED): Heparin Unfractionated: 0.39 IU/mL (ref 0.30–0.70)

## 2013-01-27 MED ORDER — WARFARIN SODIUM 10 MG PO TABS
10.0000 mg | ORAL_TABLET | Freq: Once | ORAL | Status: AC
  Administered 2013-01-27: 10 mg via ORAL
  Filled 2013-01-27: qty 1

## 2013-01-27 NOTE — Progress Notes (Signed)
ANTICOAGULATION CONSULT NOTE - Follow Up Consult  Pharmacy Consult:  Heparin Indication:  Acute DVT and CVA  No Known Allergies  Patient Measurements: Height: 5\' 5"  (165.1 cm) Weight: 271 lb 3.2 oz (123.016 kg) IBW/kg (Calculated) : 57 Heparin Dosing Weight: 87 kg  Vital Signs: Temp: 98.1 F (36.7 C) (02/09 0227) Temp src: Oral (02/09 0227) BP: 124/78 mmHg (02/09 0227) Pulse Rate: 81 (02/09 0227)  Labs:  Recent Labs  01/25/13 0615 01/25/13 1305  01/26/13 0244 01/26/13 1351 01/26/13 2020 01/27/13 0600  HGB 11.7*  --   --  11.1*  --   --  11.0*  HCT 35.2*  --   --  33.7*  --   --  33.6*  PLT 265  --   --  269  --   --  248  LABPROT  --  14.1  --  14.2  --   --  13.8  INR  --  1.10  --  1.11  --   --  1.07  HEPARINUNFRC  --   --   < > 0.69 0.20* 0.16* 0.21*  < > = values in this interval not displayed.  Estimated Creatinine Clearance: 114.5 ml/min (by C-G formula based on Cr of 0.69).   Assessment: 32 female with new DVT of bilateral posterior tibial veins to continue on IV Heparin.  Patient also had a CVA, no hemorrhage seen on head CT.    Heparin level (0.21) is below-goal on 1450 units/hr. No problem with line /infusion and no bleeding per RN. INR is 1.07 after Coumadin 7.5mg  x 2 doses.      Goal of Therapy:  HL 0.3 - 0.5 units/mL Monitor platelets by anticoagulation protocol: Yes   Plan:  1. Increase IV heparin 1650 units/hr.  2. Heparin level in 6 hours.  3. Coumadin 10mg  po today.  Lorre Munroe, PharmD 01/27/2013, 6:38 AM

## 2013-01-27 NOTE — Progress Notes (Signed)
Stroke Team Progress Note  HISTORY Mary [name] is a 47 y.o. female who presents with several days of difficulty speaking. She had an ovarian cyst removed on Monday 01/21/13, and immediately following surgery her family noticed that she was having difficulty with her speech, those relatively mild. She is discharged, however did not improve after she got home and therefore her family has brought her back in for reevaluation tonight 01/23/2013. She states that she has trouble both with getting her words out, as well as understanding other people  She had a CT scan showing a subacute left parietal infarct. She had previously been prescribed phentermine, however has never taken it. Patient was not a TPA candidate secondary to delay in arrival. She was admitted for further evaluation and treatment.  SUBJECTIVE The patient is alone in her room. She feels she is still having some difficulty with expressive and receptive aphasia; however, this seems to be improving. She denies pain in her legs. She asked about discharge plans ; although, this will be somewhat dependent upon anticoagulation status.   OBJECTIVE Most recent Vital Signs: Filed Vitals:   01/26/13 1857 01/26/13 2205 01/27/13 0227 01/27/13 0643  BP: 128/67 132/74 124/78 123/74  Pulse: 88 82 81 78  Temp: 97.9 F (36.6 C) 98.2 F (36.8 C) 98.1 F (36.7 C) 98.3 F (36.8 C)  TempSrc:  Oral Oral Oral  Resp: 20 18 20 20   Height:      Weight:      SpO2: 98% 96% 95% 98%   CBG (last 3)  No results found for this basename: GLUCAP,  in the last 72 hours  IV Fluid Intake:   . heparin 1,650 Units/hr (01/27/13 0906)    MEDICATIONS . amLODipine  10 mg Oral q morning - 10a  . warfarin  10 mg Oral ONCE-1800  . warfarin   Does not apply Once  . Warfarin - Pharmacist Dosing Inpatient   Does not apply q1800   PRN:  acetaminophen, senna-docusate  Diet:  Cardiac thin liquids Activity:  As tolerated DVT Prophylaxis:  SCDs - IV  heparin  CLINICALLY SIGNIFICANT STUDIES Basic Metabolic Panel:   Recent Labs Lab 01/23/13 2027 01/24/13 0555  NA 137 139  K 3.2* 3.0*  CL 100 103  CO2 26 25  GLUCOSE 108* 114*  BUN 11 11  CREATININE 0.79 0.69  CALCIUM 9.0 8.7   Liver Function Tests: No results found for this basename: AST, ALT, ALKPHOS, BILITOT, PROT, ALBUMIN,  in the last 168 hours CBC:   Recent Labs Lab 01/23/13 2027 01/24/13 0555  01/26/13 0244 01/27/13 0600  WBC 6.7 6.1  < > 5.9 5.0  NEUTROABS 4.1 3.7  --   --   --   HGB 13.1 11.8*  < > 11.1* 11.0*  HCT 38.6 35.4*  < > 33.7* 33.6*  MCV 69.9* 69.0*  < > 69.6* 69.4*  PLT 291 258  < > 269 248  < > = values in this interval not displayed. Coagulation:   Recent Labs Lab 01/25/13 1305 01/26/13 0244 01/27/13 0600  LABPROT 14.1 14.2 13.8  INR 1.10 1.11 1.07   Cardiac Enzymes: No results found for this basename: CKTOTAL, CKMB, CKMBINDEX, TROPONINI,  in the last 168 hours Urinalysis:   Recent Labs Lab 01/24/13 0607  COLORURINE YELLOW  LABSPEC 1.017  PHURINE 5.5  GLUCOSEU NEGATIVE  HGBUR LARGE*  BILIRUBINUR NEGATIVE  KETONESUR NEGATIVE  PROTEINUR NEGATIVE  UROBILINOGEN 1.0  NITRITE NEGATIVE  LEUKOCYTESUR NEGATIVE   Lipid  Panel    Component Value Date/Time   CHOL 128 01/24/2013 0555   TRIG 108 01/24/2013 0555   HDL 41 01/24/2013 0555   CHOLHDL 3.1 01/24/2013 0555   VLDL 22 01/24/2013 0555   LDLCALC 65 01/24/2013 0555   HgbA1C  Lab Results  Component Value Date   HGBA1C 6.1* 01/24/2013    Urine Drug Screen:   No results found for this basename: labopia,  cocainscrnur,  labbenz,  amphetmu,  thcu,  labbarb    Alcohol Level: No results found for this basename: ETH,  in the last 168 hours  Hypercoagulable Panel: Normal:  C3, C4, ANA, RPR, HIV, Anti III, homocysteine ESR - 26 Pending:  CH50 Abnormal  Prot C, Prot S, lupus anticoagulant, cardiolipin antibody  Pregnancy screen - negative  CT of the brain  01/23/2013   1.  Early subacute left  parietal lobe infarct. 2.  No evidence of intracranial hemorrhage, mass effect, or midline shift. 3.  Chronic small vessel disease and old deep white matter infarcts.    CT angio of the head 1. Occluded distal left MCA branch vessel corresponding to the infarct territory. 2. Moderate small vessel disease. 3. Mild atherosclerotic irregularity within the cavernous carotid arteries bilaterally without significant stenosis.  CT angio of the neck 1. No significant stenosis of the cervical vasculature. 2. Mild atherosclerotic irregularity of the left carotid bifurcation without significant stenosis.  MRI of the brain  Open MRI to be done on an outpatient basis secondary to severe claustrophobia.   2D Echocardiogram  ejection fraction 60-65%. No source of emboli noted. TEE is planned.  Carotid Doppler  See CTA of the neck  LE venous dopplers  Bilateral lower extremities are positive for deep vein thrmbosis involving bilateral posterior tibial veins. Unable to visualize bilateral femoral veins due to technical limitations, therefore unable to exclude deep vein thrombosis in these veins.  TEE   IMPRESSION:  1.Normal LV systolic function.  2.No vegetation, clot or PFO.  3.Minimal atherosclerosis in descending thoracic aorta.  CXR  01/23/2013   No active disease.     EKG  normal sinus rhythm.   Therapy Recommendations - Outpatient physical therapy with 24-hour per day supervision/assistance recommended.  Physical Exam   Pleasant middle aged  [place] lady currently not in distress. Appears frustrated from her speech difficulties.Awake alert. Afebrile. Head is nontraumatic. Neck is supple without bruit. Hearing is normal. Cardiac exam no murmur or gallop. Lungs are clear to auscultation. Distal pulses are well felt. Neurological Exam :  Awake alert oriented x3. Moderate expressive aphasia with significant nonfluent speech with word finding difficulties and paraphasic errors. Able to name,  repeat and comprehend better with mild deficits. No dysarthria. Extraocular moments are full range without nystagmus. Fundi were not visualized. Vision acuity and fields appear normal. Face is symmetric. Tongue is midline. Motor system exam reveals no upper or lower expected drift. His symmetric and equal strength in all 4 extremities. No focal weakness.  General - pleasant 47 year old female in no acute distress. Heart - Regular rate and rhythm - 2/6 systolic murmur Lungs - Clear to auscultation but slightly decreased Extremities - Distal pulses weak to absent bilaterally trace edema. Skin - Warm and dry    ASSESSMENT Mary [name] is a 47 y.o. female presenting with speech difficulties, trouble getting words out and trouble understanding. Imaging confirms a left parietal lobe infarct. Infarct felt to be embolic. On no antiplatelets prior to admission. Now on intravenous heparin with  Coumadin dosing per pharmacy for secondary stroke prevention, bilateral lower extremity DVTs, and hypercoagulable state. Patient with resultant expressive and receptive aphasia (paraphasic errors) and right visual field cut. Work up underway.  Hypertension - Norvasc Obesity, Body mass index is 45.13 kg/(m^2). Claustrophobia - outpatient open MRI planned Hypercoagulable panel - Abnormally high protein C 153, abnormally low protein S. 55,  Positive lupus anticoagulant, Cardiolipin abnormally low. Ovarian cyst resection 01/21/2013 Bilateral lower extremity DVTs   Hospital day # 4  CT evaluation shows evidence of a left parietal stroke associated with a mild aphasia syndrome and a visual field deficit off to the right. The patient has been able to ambulate fairly well with physical therapy. OT and PT as recommended outpatient therapy. Rehabilitation consult indicates that the patient is too high level for inpatient rehabilitation. The patient was not on antiplatelet agents prior to coming into the hospital. The  patient is going on Coumadin at this time. The stroke may have been related to the hypercoagulable state. No PFO noted by TEE.   TREATMENT/PLAN  Now on IV heparin and Coumadin per pharmacy dosing. The patient has not yet had an increase in the INR on the coumadin. At this point, would continue the IV heparin as long as the patient is in the hospital, but could convert to lovenox when the patient is discharged if the INR is not therapeutic. She has been neurologically stable, not complaining of a headache.  Plan open MRI as an OP in the future  Outpatient occupational and physical therapies.   Delton See PA-C [facility] Pager [phone] 01/27/2013, 9:55 AM   Lesly Dukes

## 2013-01-27 NOTE — Progress Notes (Signed)
ANTICOAGULATION CONSULT NOTE - Follow Up Consult  Pharmacy Consult:  Heparin Indication:  Acute DVT and CVA  No Known Allergies  Patient Measurements: Height: 5\' 5"  (165.1 cm) Weight: 271 lb 3.2 oz (123.016 kg) IBW/kg (Calculated) : 57 Heparin Dosing Weight: 87 kg  Vital Signs: Temp: 98.3 F (36.8 C) (02/09 0643) Temp src: Oral (02/09 0643) BP: 123/74 mmHg (02/09 0643) Pulse Rate: 78 (02/09 0643)  Labs:  Recent Labs  01/25/13 0615 01/25/13 1305 01/26/13 0244  01/26/13 2020 01/27/13 0600 01/27/13 0948 01/27/13 1355  HGB 11.7*  --  11.1*  --   --  11.0*  --   --   HCT 35.2*  --  33.7*  --   --  33.6*  --   --   PLT 265  --  269  --   --  248  --   --   LABPROT  --  14.1 14.2  --   --  13.8  --   --   INR  --  1.10 1.11  --   --  1.07  --   --   HEPARINUNFRC  --   --  0.69  < > 0.16* 0.21*  --  0.39  CREATININE  --   --   --   --   --   --  0.69  --   < > = values in this interval not displayed.  Estimated Creatinine Clearance: 114.5 ml/min (by C-G formula based on Cr of 0.69).      Assessment: 8 female with new DVT of bilateral posterior tibial veins to continue on IV heparin and Coumadin.  Patient also had a CVA, no hemorrhage seen on head CT.  His labs reveal a high level of Protein C and he is also positive for Lupus anticoagulant.  Heparin level therapeutic; INR remains sub-therapeutic; no bleeding reported.    Goal of Therapy:  HL 0.3 - 0.5 units/mL Monitor platelets by anticoagulation protocol: Yes    Plan:  - Continue heparin gtt at 1650 units/hr - Coumadin 10mg  PO today as ordered - Daily HL / CBC / PT / INR - Consider supplementing K+    Tyshun Tuckerman D. Laney Potash, PharmD, BCPS Pager:  [phone] 01/27/2013, 2:41 PM

## 2013-01-27 NOTE — Progress Notes (Signed)
[facility] PROGRESS NOTE  Mary [name] ZOX:[id number] DOB: 1966-05-06 DOA: 01/23/2013 PCP: No primary provider on file.  Assessment/Plan: Principal Problem:   CVA (cerebral infarction) Active Problems:   HTN (hypertension)    #1. Acute left parietal stroke- patient has been placed on neurochecks, swallow evaluation. On Coumadin.Hypercoagulable panel - Abnormally high protein C 153, abnormally low protein S. 55, Positive lupus anticoagulant, Cardiolipin abnormally low, Sedimentation rate elevated at 26. Do open MRI as an OP in the future  TEE negative for PFO, ASD.Marland Kitchen MRI canceled per stroke team recommendations, Will need open MRI outpatient. CT angiography of the neck shows no significant stenosis except for mild irregularity of the left carotid bifurcation. Occluded distal left MCA consistent with a stroke territory. neurology recommends IV heparin and Coumadin, no Lovenox. Given bilateral DVT, patient will need to stay on IV heparin for 5 day overlap, as the patient cannot get Lovenox per neurology. Coumadin per pharmacy . Will need outpatient physical therapy.   ,#2. Hypertension -able on Norvasc   #3. Recent ovarian cyst surgery - can contact [facility] Dr. Chester Holstein at [phone] for further details   #4 bilateral DVT started on Lovenox, switched to heparin gtt , cannot switch to xarelto or pradaxa as the patient cannot afford these medications    Code Status: full  Family Communication: family updated about patient's clinical progress  Disposition Plan: We'll discharge home when INR is therapeutic,  Brief narrative:  47 y.o. female who presents with several days of difficulty speaking. She had an ovarian cyst removed on Monday 01/21/13, and immediately following surgery her family noticed that she was having difficulty with her speech, those relatively mild. She is discharged, however did not improve after she got home and therefore her family has brought her back in  for reevaluation tonight 01/23/2013. She states that she has trouble both with getting her words out, as well as understanding other people  She had a CT scan showing a subacute left parietal infarct. She had previously been prescribed phentermine, however has never taken it. Patient was not a TPA candidate secondary to delay in arrival. She was admitted for further evaluation and treatment.  Consultants:  Neurology  Cardiology  Procedures:  None Antibiotics:  None HPI/Subjective:  Expressive aphasia  is improving  Objective: Filed Vitals:   01/26/13 1857 01/26/13 2205 01/27/13 0227 01/27/13 0643  BP: 128/67 132/74 124/78 123/74  Pulse: 88 82 81 78  Temp: 97.9 F (36.6 C) 98.2 F (36.8 C) 98.1 F (36.7 C) 98.3 F (36.8 C)  TempSrc:  Oral Oral Oral  Resp: 20 18 20 20   Height:      Weight:      SpO2: 98% 96% 95% 98%    Intake/Output Summary (Last 24 hours) at 01/27/13 0843 Last data filed at 01/27/13 5621  Gross per 24 hour  Intake 676.52 ml  Output      0 ml  Net 676.52 ml    Exam:  HENT:  Head: Atraumatic.  Nose: Nose normal.  Mouth/Throat: Oropharynx is clear and moist.  Eyes: Conjunctivae are normal. Pupils are equal, round, and reactive to light. No scleral icterus.  Neck: Neck supple. No tracheal deviation present.  Cardiovascular: Normal rate, regular rhythm, normal heart sounds and intact distal pulses.  Pulmonary/Chest: Effort normal and breath sounds normal. No respiratory distress.  Abdominal: Soft. Normal appearance and bowel sounds are normal. She exhibits no distension. There is no tenderness.  Musculoskeletal: She exhibits no edema and  no tenderness.  Neurological: She is alert. No cranial nerve deficit.    Data Reviewed: Basic Metabolic Panel:  Recent Labs Lab 01/23/13 2027 01/24/13 0555  NA 137 139  K 3.2* 3.0*  CL 100 103  CO2 26 25  GLUCOSE 108* 114*  BUN 11 11  CREATININE 0.79 0.69  CALCIUM 9.0 8.7    Liver Function Tests: No  results found for this basename: AST, ALT, ALKPHOS, BILITOT, PROT, ALBUMIN,  in the last 168 hours No results found for this basename: LIPASE, AMYLASE,  in the last 168 hours No results found for this basename: AMMONIA,  in the last 168 hours  CBC:  Recent Labs Lab 01/23/13 2027 01/24/13 0555 01/25/13 0615 01/26/13 0244 01/27/13 0600  WBC 6.7 6.1 5.4 5.9 5.0  NEUTROABS 4.1 3.7  --   --   --   HGB 13.1 11.8* 11.7* 11.1* 11.0*  HCT 38.6 35.4* 35.2* 33.7* 33.6*  MCV 69.9* 69.0* 69.8* 69.6* 69.4*  PLT 291 258 265 269 248    Cardiac Enzymes: No results found for this basename: CKTOTAL, CKMB, CKMBINDEX, TROPONINI,  in the last 168 hours BNP (last 3 results) No results found for this basename: PROBNP,  in the last 8760 hours   CBG: No results found for this basename: GLUCAP,  in the last 168 hours  No results found for this or any previous visit (from the past 240 hour(s)).   Studies: Ct Angio Head W/cm &/or Wo Cm  01/24/2013  *RADIOLOGY REPORT*  Clinical Data:  Hypertension.  Follow-up CVA.  Persistent expressive aphasia.  CT ANGIOGRAPHY HEAD AND NECK  Technique:  Multidetector CT imaging of the head and neck was performed using the standard protocol during bolus administration of intravenous contrast.  Multiplanar CT image reconstructions including MIPs were obtained to evaluate the vascular anatomy. Carotid stenosis measurements (when applicable) are obtained utilizing NASCET criteria, using the distal internal carotid diameter as the denominator.  Contrast: 50mL OMNIPAQUE IOHEXOL 350 MG/ML SOLN  Comparison:  CT head without contrast 01/23/2013.  CTA NECK  Findings:  A standard three-vessel arch configuration is present. The vertebral arteries originate from the subclavian arteries bilaterally.  The left vertebral artery is the dominant vessel. There are no significant stenoses within the neck.  The right common carotid artery is within normal limits. The bifurcation is unremarkable.   Right internal carotid artery is normal.  The left common carotid artery is within normal limits.  The there is mild irregularity at the bifurcation.  There is no significant stenosis relative to the distal vessel.  The soft tissues of the neck are unremarkable.  The lung apices are clear.  The bone windows are unremarkable.   Review of the MIP images confirms the above findings.  IMPRESSION:  1.  No significant stenosis of the cervical vasculature. 2.  Mild atherosclerotic irregularity of the left carotid bifurcation without significant stenosis.  CTA HEAD  Findings:  The posterior left frontal and parietal lobe cortical infarct is again noted.  There is no significant change in size. There is no hemorrhage.  Remote infarcts of the anterior basal ganglia are again noted bilaterally.  Additional periventricular white matter hypoattenuation is similar to the prior study.  The postcontrast images demonstrate no pathologic enhancement. There may be some luxury perfusion within the posterior left MCA territory infarct.  Mild irregularity is present within the cavernous carotid arteries bilaterally.  The A1 and M1 segments are normal.  There is irregular segmental attenuation of MCA branch  vessels bilaterally.  The posterior left MCA branch vessel is occluded, corresponding to the infarct territory.  The AICA vessels are dominant bilaterally.  The basilar artery is within normal limits.  The right posterior cerebral artery receives contributions from a posterior communicating artery and the P1 segment.  The left posterior cerebral artery originates from the basilar tip.  There is mild irregularity of distal PCA branch vessels without significant proximal stenosis or occlusion.   Review of the MIP images confirms the above findings.  IMPRESSION:  1.  Occluded distal left MCA branch vessel corresponding to the infarct territory. 2.  Moderate small vessel disease. 3.  Mild atherosclerotic irregularity within the cavernous  carotid arteries bilaterally without significant stenosis.   Original Report Authenticated By: Marin Roberts, M.D.    Dg Chest 2 View  01/23/2013  *RADIOLOGY REPORT*  Clinical Data: Memory loss.  Altered mental status.  CHEST - 2 VIEW  Comparison: None.  Findings: Mild lingular scarring is noted.  Lungs otherwise clear. No evidence of pleural effusion.  Heart size and mediastinal contours are normal.  IMPRESSION: No active disease.   Original Report Authenticated By: Myles Rosenthal, M.D.    Ct Head Wo Contrast  01/23/2013  *RADIOLOGY REPORT*  Clinical Data: Amnesia and memory loss.  Postop from pelvic surgery several days ago.  CT HEAD WITHOUT CONTRAST  Technique:  Contiguous axial images were obtained from the base of the skull through the vertex without contrast.  Comparison: None.  Findings: A well circumscribed area of cytotoxic edema is seen in the left parietal lobe, consistent with early subacute infarct. There is no evidence of hemorrhage, mass effect, or midline shift.  No other definite areas of acute to subacute cerebral infarction identified.  Chronic small vessel disease and old deep white matter infarcts are seen bilaterally.  No evidence of hydrocephalus or abnormal extra-axial fluid collections.  No skull abnormality identified.  IMPRESSION:  1.  Early subacute left parietal lobe infarct. 2.  No evidence of intracranial hemorrhage, mass effect, or midline shift. 3.  Chronic small vessel disease and old deep white matter infarcts.   Original Report Authenticated By: Myles Rosenthal, M.D.    Ct Angio Neck W/cm &/or Wo/cm  01/24/2013  *RADIOLOGY REPORT*  Clinical Data:  Hypertension.  Follow-up CVA.  Persistent expressive aphasia.  CT ANGIOGRAPHY HEAD AND NECK  Technique:  Multidetector CT imaging of the head and neck was performed using the standard protocol during bolus administration of intravenous contrast.  Multiplanar CT image reconstructions including MIPs were obtained to evaluate the  vascular anatomy. Carotid stenosis measurements (when applicable) are obtained utilizing NASCET criteria, using the distal internal carotid diameter as the denominator.  Contrast: 50mL OMNIPAQUE IOHEXOL 350 MG/ML SOLN  Comparison:  CT head without contrast 01/23/2013.  CTA NECK  Findings:  A standard three-vessel arch configuration is present. The vertebral arteries originate from the subclavian arteries bilaterally.  The left vertebral artery is the dominant vessel. There are no significant stenoses within the neck.  The right common carotid artery is within normal limits. The bifurcation is unremarkable.  Right internal carotid artery is normal.  The left common carotid artery is within normal limits.  The there is mild irregularity at the bifurcation.  There is no significant stenosis relative to the distal vessel.  The soft tissues of the neck are unremarkable.  The lung apices are clear.  The bone windows are unremarkable.   Review of the MIP images confirms the above findings.  IMPRESSION:  1.  No significant stenosis of the cervical vasculature. 2.  Mild atherosclerotic irregularity of the left carotid bifurcation without significant stenosis.  CTA HEAD  Findings:  The posterior left frontal and parietal lobe cortical infarct is again noted.  There is no significant change in size. There is no hemorrhage.  Remote infarcts of the anterior basal ganglia are again noted bilaterally.  Additional periventricular white matter hypoattenuation is similar to the prior study.  The postcontrast images demonstrate no pathologic enhancement. There may be some luxury perfusion within the posterior left MCA territory infarct.  Mild irregularity is present within the cavernous carotid arteries bilaterally.  The A1 and M1 segments are normal.  There is irregular segmental attenuation of MCA branch vessels bilaterally.  The posterior left MCA branch vessel is occluded, corresponding to the infarct territory.  The AICA vessels  are dominant bilaterally.  The basilar artery is within normal limits.  The right posterior cerebral artery receives contributions from a posterior communicating artery and the P1 segment.  The left posterior cerebral artery originates from the basilar tip.  There is mild irregularity of distal PCA branch vessels without significant proximal stenosis or occlusion.   Review of the MIP images confirms the above findings.  IMPRESSION:  1.  Occluded distal left MCA branch vessel corresponding to the infarct territory. 2.  Moderate small vessel disease. 3.  Mild atherosclerotic irregularity within the cavernous carotid arteries bilaterally without significant stenosis.   Original Report Authenticated By: Marin Roberts, M.D.     Scheduled Meds: . amLODipine  10 mg Oral q morning - 10a  . warfarin  10 mg Oral ONCE-1800  . warfarin   Does not apply Once  . Warfarin - Pharmacist Dosing Inpatient   Does not apply q1800   Continuous Infusions: . heparin 1,650 Units/hr (01/27/13 4540)    Principal Problem:   CVA (cerebral infarction) Active Problems:   HTN (hypertension)    Time spent: 40 minutes   [facility]  [facility] Pager [phone]. If 8PM-8AM, please contact night-coverage at [email or web address], password [facility] 01/27/2013, 8:43 AM  LOS: 4 days

## 2013-01-28 ENCOUNTER — Inpatient Hospital Stay (HOSPITAL_COMMUNITY): Payer: Self-pay | Admitting: *Deleted

## 2013-01-28 ENCOUNTER — Encounter (HOSPITAL_COMMUNITY): Payer: Self-pay | Admitting: Cardiovascular Disease

## 2013-01-28 LAB — BASIC METABOLIC PANEL
BUN: 8 mg/dL (ref 6–23)
Calcium: 8.7 mg/dL (ref 8.4–10.5)
GFR calc Af Amer: >90 mL/min (ref >90)
GFR calc non Af Amer: >90 mL/min (ref >90)
Potassium: 3.4 mEq/L — ABNORMAL LOW (ref 3.5–5.1)
Sodium: 138 mEq/L (ref 135–145)

## 2013-01-28 LAB — PROTIME-INR: Prothrombin Time: 17.9 seconds — ABNORMAL HIGH (ref 11.6–15.2)

## 2013-01-28 LAB — CBC
Platelets: 228 10*3/uL (ref 150–400)
RBC: 5.04 MIL/uL (ref 3.87–5.11)
RDW: 16.5 % — ABNORMAL HIGH (ref 11.5–15.5)
WBC: 5.4 10*3/uL (ref 4.0–10.5)

## 2013-01-28 LAB — HEPARIN LEVEL (UNFRACTIONATED): Heparin Unfractionated: 0.32 IU/mL (ref 0.30–0.70)

## 2013-01-28 MED ORDER — POTASSIUM CHLORIDE CRYS ER 20 MEQ PO TBCR
20.0000 meq | EXTENDED_RELEASE_TABLET | Freq: Two times a day (BID) | ORAL | Status: AC
  Administered 2013-01-28 (×2): 20 meq via ORAL
  Filled 2013-01-28 (×2): qty 1

## 2013-01-28 MED ORDER — WARFARIN SODIUM 10 MG PO TABS
10.0000 mg | ORAL_TABLET | Freq: Once | ORAL | Status: AC
  Administered 2013-01-28: 10 mg via ORAL
  Filled 2013-01-28 (×2): qty 1

## 2013-01-28 NOTE — Progress Notes (Signed)
[facility] PROGRESS NOTE  Mary [name]:[id number] DOB: 12/14/1966 DOA: 01/23/2013 PCP: No primary provider on file.  Assessment/Plan: Principal Problem:   CVA (cerebral infarction) Active Problems:   HTN (hypertension)    #1. Acute left parietal stroke- patient has been placed on neurochecks, swallow evaluation. On Coumadin.Hypercoagulable panel - Abnormally high protein C 153, abnormally low protein S. 55, Positive lupus anticoagulant, Cardiolipin abnormally low, Sedimentation rate elevated at 26.  Do open MRI as an OP in the future TEE negative for PFO, ASD.Marland Kitchen MRI canceled per stroke team recommendations, Will need open MRI outpatient. CT angiography of the neck shows no significant stenosis except for mild irregularity of the left carotid bifurcation. Occluded distal left MCA consistent with a stroke territory. neurology recommends IV heparin and Coumadin, will discuss with neurology how soon  she can be switched to Lovenox safely. Given bilateral DVT, patient will need to stay on IV heparin/Lovenox with a  5 day overlap,  Coumadin per pharmacy . Will need outpatient physical therapy.    ,#2. Hypertension -able on Norvasc  #3. Recent ovarian cyst surgery - can contact [facility] Dr. Chester Holstein at [phone] for further details  #4 bilateral DVT started on Lovenox, switched to heparin gtt , cannot switch to xarelto or pradaxa as the patient cannot afford these medications    Code Status: full  Family Communication: family updated about patient's clinical progress  Disposition Plan: We'll discharge home when INR is therapeutic,   Brief narrative:  47 y.o. female who presents with several days of difficulty speaking. She had an ovarian cyst removed on Monday 01/21/13, and immediately following surgery her family noticed that she was having difficulty with her speech, those relatively mild. She is discharged, however did not improve after she got home and therefore her  family has brought her back in for reevaluation tonight 01/23/2013. She states that she has trouble both with getting her words out, as well as understanding other people  She had a CT scan showing a subacute left parietal infarct. She had previously been prescribed phentermine, however has never taken it. Patient was not a TPA candidate secondary to delay in arrival. She was admitted for further evaluation and treatment.  Consultants:  Neurology  Cardiology  Procedures:  None Antibiotics:  None HPI/Subjective:  She feels she is still having some difficulty with expressive and receptive aphasia; however, this seems to be improving. She denies pain in her legs   Objective: Filed Vitals:   01/27/13 1741 01/27/13 2230 01/28/13 0151 01/28/13 0610  BP: 125/69 127/74 129/72 132/73  Pulse: 80 79 76 71  Temp: 97.9 F (36.6 C) 98 F (36.7 C) 98.3 F (36.8 C) 98 F (36.7 C)  TempSrc:  Oral Oral Oral  Resp: 20 18 20 20   Height:      Weight:      SpO2: 99% 97% 96% 97%    Intake/Output Summary (Last 24 hours) at 01/28/13 7846 Last data filed at 01/28/13 9629  Gross per 24 hour  Intake    678 ml  Output      0 ml  Net    678 ml    Exam:  HENT:  Head: Atraumatic.  Nose: Nose normal.  Mouth/Throat: Oropharynx is clear and moist.  Eyes: Conjunctivae are normal. Pupils are equal, round, and reactive to light. No scleral icterus.  Neck: Neck supple. No tracheal deviation present.  Cardiovascular: Normal rate, regular rhythm, normal heart sounds and intact distal pulses.  Pulmonary/Chest:  Effort normal and breath sounds normal. No respiratory distress.  Abdominal: Soft. Normal appearance and bowel sounds are normal. She exhibits no distension. There is no tenderness.  Musculoskeletal: She exhibits no edema and no tenderness.  Neurological: She is alert. No cranial nerve deficit.    Data Reviewed: Basic Metabolic Panel:  Recent Labs Lab 01/23/13 2027 01/24/13 0555 01/27/13 0948   NA 137 139 139  K 3.2* 3.0* 3.4*  CL 100 103 104  CO2 26 25 24   GLUCOSE 108* 114* 117*  BUN 11 11 7   CREATININE 0.79 0.69 0.69  CALCIUM 9.0 8.7 8.8    Liver Function Tests:  Recent Labs Lab 01/27/13 0948  AST 19  ALT 21  ALKPHOS 58  BILITOT 0.2*  PROT 7.5  ALBUMIN 3.2*   No results found for this basename: LIPASE, AMYLASE,  in the last 168 hours No results found for this basename: AMMONIA,  in the last 168 hours  CBC:  Recent Labs Lab 01/23/13 2027 01/24/13 0555 01/25/13 0615 01/26/13 0244 01/27/13 0600 01/28/13 0730  WBC 6.7 6.1 5.4 5.9 5.0 5.4  NEUTROABS 4.1 3.7  --   --   --   --   HGB 13.1 11.8* 11.7* 11.1* 11.0* 11.8*  HCT 38.6 35.4* 35.2* 33.7* 33.6* 35.0*  MCV 69.9* 69.0* 69.8* 69.6* 69.4* 69.4*  PLT 291 258 265 269 248 228    Cardiac Enzymes: No results found for this basename: CKTOTAL, CKMB, CKMBINDEX, TROPONINI,  in the last 168 hours BNP (last 3 results) No results found for this basename: PROBNP,  in the last 8760 hours   CBG: No results found for this basename: GLUCAP,  in the last 168 hours  No results found for this or any previous visit (from the past 240 hour(s)).   Studies: Ct Angio Head W/cm &/or Wo Cm  01/24/2013  *RADIOLOGY REPORT*  Clinical Data:  Hypertension.  Follow-up CVA.  Persistent expressive aphasia.  CT ANGIOGRAPHY HEAD AND NECK  Technique:  Multidetector CT imaging of the head and neck was performed using the standard protocol during bolus administration of intravenous contrast.  Multiplanar CT image reconstructions including MIPs were obtained to evaluate the vascular anatomy. Carotid stenosis measurements (when applicable) are obtained utilizing NASCET criteria, using the distal internal carotid diameter as the denominator.  Contrast: 50mL OMNIPAQUE IOHEXOL 350 MG/ML SOLN  Comparison:  CT head without contrast 01/23/2013.  CTA NECK  Findings:  A standard three-vessel arch configuration is present. The vertebral arteries  originate from the subclavian arteries bilaterally.  The left vertebral artery is the dominant vessel. There are no significant stenoses within the neck.  The right common carotid artery is within normal limits. The bifurcation is unremarkable.  Right internal carotid artery is normal.  The left common carotid artery is within normal limits.  The there is mild irregularity at the bifurcation.  There is no significant stenosis relative to the distal vessel.  The soft tissues of the neck are unremarkable.  The lung apices are clear.  The bone windows are unremarkable.   Review of the MIP images confirms the above findings.  IMPRESSION:  1.  No significant stenosis of the cervical vasculature. 2.  Mild atherosclerotic irregularity of the left carotid bifurcation without significant stenosis.  CTA HEAD  Findings:  The posterior left frontal and parietal lobe cortical infarct is again noted.  There is no significant change in size. There is no hemorrhage.  Remote infarcts of the anterior basal ganglia are again noted  bilaterally.  Additional periventricular white matter hypoattenuation is similar to the prior study.  The postcontrast images demonstrate no pathologic enhancement. There may be some luxury perfusion within the posterior left MCA territory infarct.  Mild irregularity is present within the cavernous carotid arteries bilaterally.  The A1 and M1 segments are normal.  There is irregular segmental attenuation of MCA branch vessels bilaterally.  The posterior left MCA branch vessel is occluded, corresponding to the infarct territory.  The AICA vessels are dominant bilaterally.  The basilar artery is within normal limits.  The right posterior cerebral artery receives contributions from a posterior communicating artery and the P1 segment.  The left posterior cerebral artery originates from the basilar tip.  There is mild irregularity of distal PCA branch vessels without significant proximal stenosis or occlusion.    Review of the MIP images confirms the above findings.  IMPRESSION:  1.  Occluded distal left MCA branch vessel corresponding to the infarct territory. 2.  Moderate small vessel disease. 3.  Mild atherosclerotic irregularity within the cavernous carotid arteries bilaterally without significant stenosis.   Original Report Authenticated By: Marin Roberts, M.D.    Dg Chest 2 View  01/23/2013  *RADIOLOGY REPORT*  Clinical Data: Memory loss.  Altered mental status.  CHEST - 2 VIEW  Comparison: None.  Findings: Mild lingular scarring is noted.  Lungs otherwise clear. No evidence of pleural effusion.  Heart size and mediastinal contours are normal.  IMPRESSION: No active disease.   Original Report Authenticated By: Myles Rosenthal, M.D.    Ct Head Wo Contrast  01/23/2013  *RADIOLOGY REPORT*  Clinical Data: Amnesia and memory loss.  Postop from pelvic surgery several days ago.  CT HEAD WITHOUT CONTRAST  Technique:  Contiguous axial images were obtained from the base of the skull through the vertex without contrast.  Comparison: None.  Findings: A well circumscribed area of cytotoxic edema is seen in the left parietal lobe, consistent with early subacute infarct. There is no evidence of hemorrhage, mass effect, or midline shift.  No other definite areas of acute to subacute cerebral infarction identified.  Chronic small vessel disease and old deep white matter infarcts are seen bilaterally.  No evidence of hydrocephalus or abnormal extra-axial fluid collections.  No skull abnormality identified.  IMPRESSION:  1.  Early subacute left parietal lobe infarct. 2.  No evidence of intracranial hemorrhage, mass effect, or midline shift. 3.  Chronic small vessel disease and old deep white matter infarcts.   Original Report Authenticated By: Myles Rosenthal, M.D.    Ct Angio Neck W/cm &/or Wo/cm  01/24/2013  *RADIOLOGY REPORT*  Clinical Data:  Hypertension.  Follow-up CVA.  Persistent expressive aphasia.  CT ANGIOGRAPHY HEAD AND NECK   Technique:  Multidetector CT imaging of the head and neck was performed using the standard protocol during bolus administration of intravenous contrast.  Multiplanar CT image reconstructions including MIPs were obtained to evaluate the vascular anatomy. Carotid stenosis measurements (when applicable) are obtained utilizing NASCET criteria, using the distal internal carotid diameter as the denominator.  Contrast: 50mL OMNIPAQUE IOHEXOL 350 MG/ML SOLN  Comparison:  CT head without contrast 01/23/2013.  CTA NECK  Findings:  A standard three-vessel arch configuration is present. The vertebral arteries originate from the subclavian arteries bilaterally.  The left vertebral artery is the dominant vessel. There are no significant stenoses within the neck.  The right common carotid artery is within normal limits. The bifurcation is unremarkable.  Right internal carotid artery is normal.  The left  common carotid artery is within normal limits.  The there is mild irregularity at the bifurcation.  There is no significant stenosis relative to the distal vessel.  The soft tissues of the neck are unremarkable.  The lung apices are clear.  The bone windows are unremarkable.   Review of the MIP images confirms the above findings.  IMPRESSION:  1.  No significant stenosis of the cervical vasculature. 2.  Mild atherosclerotic irregularity of the left carotid bifurcation without significant stenosis.  CTA HEAD  Findings:  The posterior left frontal and parietal lobe cortical infarct is again noted.  There is no significant change in size. There is no hemorrhage.  Remote infarcts of the anterior basal ganglia are again noted bilaterally.  Additional periventricular white matter hypoattenuation is similar to the prior study.  The postcontrast images demonstrate no pathologic enhancement. There may be some luxury perfusion within the posterior left MCA territory infarct.  Mild irregularity is present within the cavernous carotid  arteries bilaterally.  The A1 and M1 segments are normal.  There is irregular segmental attenuation of MCA branch vessels bilaterally.  The posterior left MCA branch vessel is occluded, corresponding to the infarct territory.  The AICA vessels are dominant bilaterally.  The basilar artery is within normal limits.  The right posterior cerebral artery receives contributions from a posterior communicating artery and the P1 segment.  The left posterior cerebral artery originates from the basilar tip.  There is mild irregularity of distal PCA branch vessels without significant proximal stenosis or occlusion.   Review of the MIP images confirms the above findings.  IMPRESSION:  1.  Occluded distal left MCA branch vessel corresponding to the infarct territory. 2.  Moderate small vessel disease. 3.  Mild atherosclerotic irregularity within the cavernous carotid arteries bilaterally without significant stenosis.   Original Report Authenticated By: Marin Roberts, M.D.     Scheduled Meds: . amLODipine  10 mg Oral q morning - 10a  . potassium chloride  20 mEq Oral BID  . Warfarin - Pharmacist Dosing Inpatient   Does not apply q1800   Continuous Infusions: . heparin 1,650 Units/hr (01/27/13 0906)    Principal Problem:   CVA (cerebral infarction) Active Problems:   HTN (hypertension)    Time spent: 40 minutes   [facility]  [facility] Pager [phone]. If 8PM-8AM, please contact night-coverage at [email or web address], password [facility] 01/28/2013, 8:22 AM  LOS: 5 days

## 2013-01-28 NOTE — Progress Notes (Signed)
Speech Language Pathology Treatment Patient Details Name: Mary [name] MRN: [id number] DOB: Oct 14, 1966 Today's Date: 01/28/2013 Time: 1405-1501 SLP Time Calculation (min): 56 min  Assessment / Plan / Recommendation Clinical Impression  Treatment focused on maximizing functional communication across modalities and education of family to faciliate expressive language for this pt.  Improvement in receptive language apparent with pt being able to follow simple directions consistently today.  Expressive language also improved compared to initial eval as pt verbalized her name without delay and demonstrated more periods of speech fluency.  Word finding difficulties and phonemic paraphasia continue (albeit less) resulting in dysfluencies to which pt is aware.  Reading single words (written by SLP), repeating and completing automatic-type phrases are effective compensatory strategies to maximize pt's expressive language.   Today pt was able to name pictures with 2 phonemic and one semantic paraphasias.  SLP educated family to need to have pt repeat word if they state it for her, use automatic completion or write single word for pt to say to facilitate communication.  SLP also reviewed use of gestures and/or communication board to facilitate communication with pt.  Pt demonstrated use of comm board with min assist.  Pt and family (mother and sister) verbalized understanding to all information provided including need to tx in short time segments to decrease frustration and fatigue.    Pt will benefit from intensive slp therapy to maximize her functional communication.  Note plan is for pt to dc home when INR is at appropriate level.  Agree that 24/7 supervision indicated in this slp's opinion due to communication deficits.      SLP Plan  Continue with current plan of care    Pertinent Vitals/Pain Afebrile,decreased  SLP Goals  SLP Goals Potential to Achieve Goals: Fair Progress/Goals/Alternative  treatment plan discussed with pt/caregiver and they: Agree (pt with neurologist ) SLP Goal #1: Pt will follow two step commands during functional tasks with mod verbal/visual cues. SLP Goal #1 - Progress: Progressing toward goal SLP Goal #2: Pt will use compensatory strategies for expressive communication at phrase level with maximum verbal/visual cues and 80% accuracy.  SLP Goal #2 - Progress: Progressing toward goal SLP Goal #3: Pt will demonstrate ability to comprehend written language at phrase level with mod verbal/visual cues.   SLP Goal #3 - Progress: Progressing toward goal SLP Goal #4: Pt will name functional daily items with moderate verbal, articulatory phrase compeltion cues and 80% accuracy.  SLP Goal #4 - Progress: Progressing toward goal SLP Goal #5: Family will demonstrate use of compensatory strategies to maximize pt's functional expressive language with moderate verbal/visual cues.   SLP Goal #5 - Progress: Progressing toward goal  General Temperature Spikes Noted: No Respiratory Status: Room air Behavior/Cognition: Alert;Cooperative;Pleasant mood;Other (comment) (becomes frustrated with speech deficits) Oral Cavity - Dentition: Adequate natural dentition   Treatment Treatment focused on: Aphasia Skilled Treatment: SLP verbal visual cues, compensatory strategies, language facilitation, functional tasks, augmentative communication- (comm board), family and pt education,    GO    Donavan Burnet, MS [facility] SLP [phone]

## 2013-01-28 NOTE — Progress Notes (Signed)
PT Cancellation Note  Patient Details Name: Mary [name] MRN: [id number] DOB: March 20, 1966   Cancelled Treatment:    Reason Eval/Treat Not Completed:  (speech working with patient. PT to return as able.)   Marcene Brawn 01/28/2013, 4:31 PM  Lewis Shock, PT, DPT Pager #: [phone] Office #: [phone]

## 2013-01-28 NOTE — Progress Notes (Signed)
Stroke Team Progress Note  HISTORY Mary [name] is a 47 y.o. female who presents with several days of difficulty speaking. She had an ovarian cyst removed on Monday 01/21/13, and immediately following surgery her family noticed that she was having difficulty with her speech, those relatively mild. She is discharged, however did not improve after she got home and therefore her family has brought her back in for reevaluation tonight 01/23/2013. She states that she has trouble both with getting her words out, as well as understanding other people  She had a CT scan showing a subacute left parietal infarct. She had previously been prescribed phentermine, however has never taken it. Patient was not a TPA candidate secondary to delay in arrival. She was admitted for further evaluation and treatment.  SUBJECTIVE "I'm frustrated right now".  OBJECTIVE Most recent Vital Signs: Filed Vitals:   01/27/13 2230 01/28/13 0151 01/28/13 0610 01/28/13 0941  BP: 127/74 129/72 132/73 150/85  Pulse: 79 76 71 78  Temp: 98 F (36.7 C) 98.3 F (36.8 C) 98 F (36.7 C) 98.2 F (36.8 C)  TempSrc: Oral Oral Oral Oral  Resp: 18 20 20 20   Height:      Weight:      SpO2: 97% 96% 97% 98%   CBG (last 3)  No results found for this basename: GLUCAP,  in the last 72 hours  IV Fluid Intake:   . heparin 1,650 Units/hr (01/27/13 0906)    MEDICATIONS . amLODipine  10 mg Oral q morning - 10a  . potassium chloride  20 mEq Oral BID  . Warfarin - Pharmacist Dosing Inpatient   Does not apply q1800   PRN:  acetaminophen, senna-docusate  Diet:  Cardiac thin liquids Activity:  As tolerated DVT Prophylaxis:  SCDs - IV heparin  CLINICALLY SIGNIFICANT STUDIES Basic Metabolic Panel:   Recent Labs Lab 01/27/13 0948 01/28/13 0730  NA 139 138  K 3.4* 3.4*  CL 104 105  CO2 24 25  GLUCOSE 117* 97  BUN 7 8  CREATININE 0.69 0.71  CALCIUM 8.8 8.7   Liver Function Tests:   Recent Labs Lab 01/27/13 0948  AST 19   ALT 21  ALKPHOS 58  BILITOT 0.2*  PROT 7.5  ALBUMIN 3.2*   CBC:   Recent Labs Lab 01/23/13 2027 01/24/13 0555  01/27/13 0600 01/28/13 0730  WBC 6.7 6.1  < > 5.0 5.4  NEUTROABS 4.1 3.7  --   --   --   HGB 13.1 11.8*  < > 11.0* 11.8*  HCT 38.6 35.4*  < > 33.6* 35.0*  MCV 69.9* 69.0*  < > 69.4* 69.4*  PLT 291 258  < > 248 228  < > = values in this interval not displayed. Coagulation:   Recent Labs Lab 01/25/13 1305 01/26/13 0244 01/27/13 0600 01/28/13 0730  LABPROT 14.1 14.2 13.8 17.9*  INR 1.10 1.11 1.07 1.52*   Cardiac Enzymes: No results found for this basename: CKTOTAL, CKMB, CKMBINDEX, TROPONINI,  in the last 168 hours Urinalysis:   Recent Labs Lab 01/24/13 0607  COLORURINE YELLOW  LABSPEC 1.017  PHURINE 5.5  GLUCOSEU NEGATIVE  HGBUR LARGE*  BILIRUBINUR NEGATIVE  KETONESUR NEGATIVE  PROTEINUR NEGATIVE  UROBILINOGEN 1.0  NITRITE NEGATIVE  LEUKOCYTESUR NEGATIVE   Lipid Panel    Component Value Date/Time   CHOL 128 01/24/2013 0555   TRIG 108 01/24/2013 0555   HDL 41 01/24/2013 0555   CHOLHDL 3.1 01/24/2013 0555   VLDL 22 01/24/2013 0555  LDLCALC 65 01/24/2013 0555   HgbA1C  Lab Results  Component Value Date   HGBA1C 6.1* 01/24/2013    Urine Drug Screen:   No results found for this basename: labopia,  cocainscrnur,  labbenz,  amphetmu,  thcu,  labbarb    Alcohol Level: No results found for this basename: ETH,  in the last 168 hours  Hypercoagulable Panel: Normal:  C3, C4, ANA, RPR, HIV, Anti III, homocysteine ESR - 26 Pending:  CH50 Abnormal  Prot C, Prot S, lupus anticoagulant, cardiolipin antibody  Pregnancy screen - negative  CT of the brain  01/23/2013   1.  Early subacute left parietal lobe infarct. 2.  No evidence of intracranial hemorrhage, mass effect, or midline shift. 3.  Chronic small vessel disease and old deep white matter infarcts.    CT angio of the head 1. Occluded distal left MCA branch vessel corresponding to the infarct territory.  2. Moderate small vessel disease. 3. Mild atherosclerotic irregularity within the cavernous carotid arteries bilaterally without significant stenosis.  CT angio of the neck 1. No significant stenosis of the cervical vasculature. 2. Mild atherosclerotic irregularity of the left carotid bifurcation without significant stenosis.  MRI of the brain  Open MRI to be done on an outpatient basis secondary to severe claustrophobia.   2D Echocardiogram  ejection fraction 60-65%. No source of emboli noted. TEE is planned.  Carotid Doppler  See CTA of the neck  LE venous dopplers  Bilateral lower extremities are positive for deep vein thrmbosis involving bilateral posterior tibial veins. Unable to visualize bilateral femoral veins due to technical limitations, therefore unable to exclude deep vein thrombosis in these veins.  TEE  1.Normal LV systolic function. 2.No vegetation, clot or PFO. 3.Minimal atherosclerosis in descending thoracic aorta.  CXR  01/23/2013   No active disease.     EKG  normal sinus rhythm.   Therapy Recommendations - Outpatient PT and OT with 24-hour per day supervision/assistance recommended. OP ST.  Physical Exam   Pleasant middle aged  [place] lady currently not in distress. Appears frustrated from her speech difficulties.Awake alert. Afebrile. Head is nontraumatic. Neck is supple without bruit. Hearing is normal. Cardiac exam no murmur or gallop. Lungs are clear to auscultation. Distal pulses are well felt. Neurological Exam :  Awake alert oriented x3. Moderate expressive aphasia with significant nonfluent speech with word finding difficulties and paraphasic errors. Able to name, repeat and comprehend better with mild deficits. No dysarthria. Extraocular moments are full range without nystagmus. Fundi were not visualized. Vision acuity and fields appear normal. Face is symmetric. Tongue is midline. Motor system exam reveals no upper or lower expected drift. His symmetric and  equal strength in all 4 extremities. No focal weakness.  General - pleasant 47 year old female in no acute distress. Heart - Regular rate and rhythm - 2/6 systolic murmur Lungs - Clear to auscultation but slightly decreased Extremities - Distal pulses weak to absent bilaterally trace edema. Skin - Warm and dry  ASSESSMENT [name] is a 47 y.o. female presenting with speech difficulties, trouble getting words out and trouble understanding. Imaging confirms a left parietal lobe infarct. Infarct felt to be embolic. On no antiplatelets prior to admission. Now on intravenous heparin with Coumadin dosing per pharmacy for secondary stroke prevention, bilateral lower extremity DVTs, and hypercoagulable state. Patient with resultant expressive and receptive aphasia (paraphasic errors) and right visual field cut. Work up completed.  Hypertension - Norvasc Obesity, Body mass index is 45.13 kg/(m^2).  Claustrophobia - outpatient open MRI planned Hypercoagulable panel - Abnormally high protein C 153, abnormally low protein S. 55,  Positive lupus anticoagulant, Cardiolipin abnormally low. Ovarian cyst resection 01/21/2013 Bilateral lower extremity DVTs   Hospital day # 5  TREATMENT/PLAN  Continue IV heparin and Coumadin per pharmacy. INR remains sub therapeutic. At this point, ok to change to full dose lovenox so patient can discharge.   Plan open MRI as an OP in the future  Outpatient occupational, physical and speech therapies.   Follow up Dr. Pearlean Brownie in 2 months  Annie Main, MSN, RN, ANVP-BC, ANP-BC, [facility] Pager: [phone] 01/28/2013 2:48 PM  I have personally obtained a history, examined the patient, evaluated imaging results, and formulated the assessment and plan of care. I agree with the above.  Delia Heady, MD Medical Director [facility] Pager: [phone] 01/28/2013 7:27 PM

## 2013-01-28 NOTE — Progress Notes (Signed)
ANTICOAGULATION CONSULT NOTE - Follow Up Consult  Pharmacy Consult:  Heparin + coumadin Indication:  Acute DVT and CVA  No Known Allergies  Patient Measurements: Height: 5\' 5"  (165.1 cm) Weight: 271 lb 3.2 oz (123.016 kg) IBW/kg (Calculated) : 57 Heparin Dosing Weight: 87 kg  Vital Signs: Temp: 98.2 F (36.8 C) (02/10 0941) Temp src: Oral (02/10 0941) BP: 150/85 mmHg (02/10 0941) Pulse Rate: 78 (02/10 0941)  Labs:  Recent Labs  01/26/13 0244  01/27/13 0600 01/27/13 0948 01/27/13 1355 01/28/13 0730  HGB 11.1*  --  11.0*  --   --  11.8*  HCT 33.7*  --  33.6*  --   --  35.0*  PLT 269  --  248  --   --  228  LABPROT 14.2  --  13.8  --   --  17.9*  INR 1.11  --  1.07  --   --  1.52*  HEPARINUNFRC 0.69  < > 0.21*  --  0.39 0.32  CREATININE  --   --   --  0.69  --  0.71  < > = values in this interval not displayed.  Estimated Creatinine Clearance: 114.5 ml/min (by C-G formula based on Cr of 0.71).  Assessment: 1 female with new DVT of bilateral posterior tibial veins continues on IV heparin and Coumadin.  Patient also had a CVA, no hemorrhage seen on head CT.  His labs reveal a high level of Protein C and he is also positive for Lupus anticoagulant.  Heparin level therapeutic; INR remains sub-therapeutic but now starting to trend up; no bleeding reported.   Goal of Therapy:  HL 0.3 - 0.5 units/mL Monitor platelets by anticoagulation protocol: Yes   Plan:  - Continue heparin gtt at 1650 units/hr - Repeat Coumadin 10mg  PO x 1 tonight - Daily HL / CBC / PT / INR  Lysle Pearl, PharmD, BCPS Pager # [phone] 01/28/2013 11:27 AM

## 2013-01-29 DIAGNOSIS — R4701 Aphasia: Secondary | ICD-10-CM

## 2013-01-29 DIAGNOSIS — I6350 Cerebral infarction due to unspecified occlusion or stenosis of unspecified cerebral artery: Principal | ICD-10-CM

## 2013-01-29 LAB — CBC
MCH: 23.2 pg — ABNORMAL LOW (ref 26.0–34.0)
MCV: 70 fL — ABNORMAL LOW (ref 78.0–100.0)
Platelets: 292 10*3/uL (ref 150–400)
RBC: 5.13 MIL/uL — ABNORMAL HIGH (ref 3.87–5.11)
RDW: 16.5 % — ABNORMAL HIGH (ref 11.5–15.5)
WBC: 5.3 10*3/uL (ref 4.0–10.5)

## 2013-01-29 LAB — BASIC METABOLIC PANEL
BUN: 8 mg/dL (ref 6–23)
Calcium: 8.8 mg/dL (ref 8.4–10.5)
GFR calc Af Amer: >90 mL/min (ref >90)
GFR calc non Af Amer: >90 mL/min (ref >90)
Glucose, Bld: 99 mg/dL (ref 70–99)
Potassium: 3.8 mEq/L (ref 3.5–5.1)

## 2013-01-29 LAB — PROTIME-INR: Prothrombin Time: 21.4 seconds — ABNORMAL HIGH (ref 11.6–15.2)

## 2013-01-29 MED ORDER — RIVAROXABAN 15 MG PO TABS
15.0000 mg | ORAL_TABLET | Freq: Two times a day (BID) | ORAL | Status: DC
  Administered 2013-01-29: 15 mg via ORAL
  Filled 2013-01-29 (×3): qty 1

## 2013-01-29 MED ORDER — AMLODIPINE BESYLATE 10 MG PO TABS: 10.0000 mg | ORAL_TABLET | Freq: Every morning | ORAL | Status: DC

## 2013-01-29 MED ORDER — RIVAROXABAN 15 MG PO TABS: 15.0000 mg | ORAL_TABLET | Freq: Two times a day (BID) | ORAL | Status: DC

## 2013-01-29 MED ORDER — RIVAROXABAN 20 MG PO TABS: 20.0000 mg | ORAL_TABLET | Freq: Every day | ORAL | Status: DC

## 2013-01-29 MED ORDER — HEPARIN (PORCINE) IN NACL 100-0.45 UNIT/ML-% IJ SOLN: 1650.0000 [IU]/h | INTRAMUSCULAR | Status: DC

## 2013-01-29 NOTE — Progress Notes (Addendum)
Physical Therapy Treatment Patient Details Name: Mary [name] MRN: [id number] DOB: 1966-11-24 Today's Date: 01/29/2013 Time: 4098-1191 PT Time Calculation (min): 23 min  PT Assessment / Plan / Recommendation Comments on Treatment Session  Pt making good progress with mobility.   Cont's to present with expressive & receptive aphasia-- Required VC & Visual cueing for various tasks such as when performing gait challenges & stairs.      Follow Up Recommendations  Outpatient PT;Supervision/Assistance - 24 hour     Does the patient have the potential to tolerate intense rehabilitation     Barriers to Discharge        Equipment Recommendations  None recommended by PT;Other (comment)    Recommendations for Other Services    Frequency Min 4X/week   Plan      Precautions / Restrictions Precautions Precautions: Other (comment) (expressive aphasia) Restrictions Weight Bearing Restrictions: No       Mobility  Bed Mobility Bed Mobility: Not assessed Transfers Transfers: Sit to Stand;Stand to Sit Sit to Stand: 6: Modified independent (Device/Increase time);With upper extremity assist;With armrests;From chair/3-in-1 Stand to Sit: 6: Modified independent (Device/Increase time);With upper extremity assist;With armrests;To chair/3-in-1 Ambulation/Gait Ambulation/Gait Assistance: 5: Supervision Ambulation Distance (Feet): 500 Feet Assistive device: None Ambulation/Gait Assistance Details: No physical (A) needed.  No instability noted.  Performed various gait challenges such as gait speed changes, directional changes, head turns in all directions, retropulsion, stop/go on cue.   Gait Pattern: Step-through pattern Stairs: Yes Stairs Assistance: 4: Min guard Stairs Assistance Details (indicate cue type and reason): Encouraged pt to perform step-to on stairs to increase stability + safety.   Stair Management Technique: Step to pattern;Alternating pattern;One rail Left;Forwards Number of  Stairs:  (flight x 2) Wheelchair Mobility Wheelchair Mobility: No Modified Rankin (Stroke Patients Only) Pre-Morbid Rankin Score: No symptoms Modified Rankin: Moderate disability      PT Goals Acute Rehab PT Goals Time For Goal Achievement: 02/01/13 Potential to Achieve Goals: Good Pt will go Sit to Stand: Independently PT Goal: Sit to Stand - Progress: Progressing toward goal Pt will go Stand to Sit: Independently PT Goal: Stand to Sit - Progress: Progressing toward goal Pt will Transfer Bed to Chair/Chair to Bed: Independently Pt will Ambulate: >150 feet;with modified independence;with least restrictive assistive device PT Goal: Ambulate - Progress: Progressing toward goal Pt will Go Up / Down Stairs: Flight;with min assist;with rail(s) PT Goal: Up/Down Stairs - Progress: Met  Visit Information  Last PT Received On: 01/29/13 Assistance Needed: +1    Subjective Data      Cognition  Cognition Overall Cognitive Status: Impaired Area of Impairment: Other (comment) (expressive & receptive aphasia) Arousal/Alertness: Awake/alert Orientation Level: Appears intact for tasks assessed Behavior During Session: [facility] for tasks performed Cognition - Other Comments: Pt with expressive & receptive aphasia.  Masks deficits by laughing when asked a question or if she is unable to find appropriate word.      Balance     End of Session PT - End of Session Equipment Utilized During Treatment: Gait belt Activity Tolerance: Patient tolerated treatment well Patient left: in chair;with call bell/phone within reach Nurse Communication: Mobility status     Verdell Face, [place] [phone] 01/29/2013

## 2013-01-29 NOTE — Progress Notes (Signed)
Occupational Therapy Treatment Patient Details Name: Mary [name] MRN: [id number] DOB: 02/20/1966 Today's Date: 01/29/2013 Time: 4098-1191 OT Time Calculation (min): 44 min  OT Assessment / Plan / Recommendation Comments on Treatment Session ADL's today with focus on pt sequencing, motor planning and higher level organization tasks. Pt required frequent VC's for task initiation and redirection during ADL's as she was noted to state "I don't.."  "What was I..." doing etc throughout session. Pt attempted to recall year that she moved from [place] to [place] and was unable to do so with increased cues, calling family on telephone, as global aphasia interferred with her ability to complete tasks. She required increased time for all tasks, motor planing and sequencing throughout session.    Follow Up Recommendations  Outpatient OT    Barriers to Discharge       Equipment Recommendations       Recommendations for Other Services    Frequency     Plan Discharge plan remains appropriate    Precautions / Restrictions     Pertinent Vitals/Pain No c/o pain    ADL  Eating/Feeding: Performed;Modified independent Where Assessed - Eating/Feeding: Chair Grooming: Performed;Wash/dry hands;Wash/dry face;Teeth care;Supervision/safety Where Assessed - Grooming: Unsupported standing;Unsupported sitting Upper Body Bathing: Performed;Chest;Right arm;Left arm;Abdomen;Supervision/safety Where Assessed - Upper Body Bathing: Unsupported sitting Lower Body Bathing: Performed;Supervision/safety Where Assessed - Lower Body Bathing: Unsupported sitting;Unsupported sit to stand Upper Body Dressing: Performed;Modified independent Where Assessed - Upper Body Dressing: Unsupported standing Lower Body Dressing: Performed;Modified independent Where Assessed - Lower Body Dressing: Unsupported sitting Toilet Transfer: Simulated;Supervision/safety Toilet Transfer Method: Sit to Barista: Comfort  height toilet;Grab bars Toileting - Clothing Manipulation and Hygiene: Performed;Modified independent;Supervision/safety Where Assessed - Toileting Clothing Manipulation and Hygiene: Sit on 3-in-1 or toilet;Sit to stand from 3-in-1 or toilet Tub/Shower Transfer Method: Not assessed Transfers/Ambulation Related to ADLs: Pt holds onto IV pole during functional ambulation in room. ADL Comments: ADL's today with focus on pt sequencing, motor planning and hegher level organization tasks. Pt require frequent VC's for tasks initiation and redirection during ADL's as she was noted to state "I don't.."  "What was I..." doing etc throughout session. Pt attempted to recall year that she moved from [place] to [place] and was unable to do so with increased cues, calling family on telephone, as global aphasia interferred with her ability to complete tasks. She required increased time for tasks, motor planing and sequencing throughout session.    OT Diagnosis:    OT Problem List:   OT Treatment Interventions:     OT Goals Miscellaneous OT Goals Miscellaneous OT Goal #1: Pt will perform bathing and dressing tasks w/ no more than 1 cue for sequencing/completing task OT Goal: Miscellaneous Goal #1 - Progress: Progressing toward goals Miscellaneous OT Goal #2: Pt will I turn head to right to compensate for visual field deficit during ADL's and functional mobility OT Goal: Miscellaneous Goal #2 - Progress: Progressing toward goals  Visit Information  Last OT Received On: 01/29/13    Subjective Data  Subjective: I know, but Patient Stated Goal: Pt unable to state secondary to global aphasia        Cognition  Cognition Overall Cognitive Status: Impaired Area of Impairment: Problem solving;Memory;Executive functioning;Other (comment) (Global aphasia) Arousal/Alertness: Awake/alert Orientation Level: Appears intact for tasks assessed Behavior During Session: [facility] for tasks performed    Mobility   Transfers Transfers: Sit to Stand;Stand to Sit Sit to Stand: 6: Modified independent (Device/Increase time) Stand to Sit: 6: Modified  independent (Device/Increase time)            End of Session OT - End of Session Equipment Utilized During Treatment: Gait belt;Other (comment) (Pt amb holding onto IV pole noted during ADL's & funct mob.) Activity Tolerance: Patient tolerated treatment well Patient left: in chair;with call bell/phone within reach  GO     Alm Bustard 01/29/2013, 11:28 AM

## 2013-01-29 NOTE — Discharge Summary (Signed)
Physician Discharge Summary  Mary [name] MRN: [id number] DOB/AGE: 1965-12-28 47 y.o.  PCP: No primary provider on file.   Admit date: 01/23/2013 Discharge date: 01/29/2013  Discharge Diagnoses:   Bilateral DVT   CVA (cerebral infarction)left parietal lobe infarct  Active Problems:   HTN (hypertension)     Medication List    TAKE these medications       amLODipine 10 MG tablet  Commonly known as:  NORVASC  Take 10 mg by mouth every morning.     Rivaroxaban 15 MG Tabs tablet  Commonly known as:  XARELTO  Take 1 tablet (15 mg total) by mouth 2 (two) times daily.     Rivaroxaban 20 MG Tabs  Commonly known as:  XARELTO  Take 1 tablet (20 mg total) by mouth daily.  Start taking on:  02/19/2013        Discharge Condition: Stable   Disposition: Final discharge disposition not confirmed   Consults:  #1 neurology   Significant Diagnostic Studies: Ct Angio Head W/cm &/or Wo Cm  01/24/2013  *RADIOLOGY REPORT*  Clinical Data:  Hypertension.  Follow-up CVA.  Persistent expressive aphasia.  CT ANGIOGRAPHY HEAD AND NECK  Technique:  Multidetector CT imaging of the head and neck was performed using the standard protocol during bolus administration of intravenous contrast.  Multiplanar CT image reconstructions including MIPs were obtained to evaluate the vascular anatomy. Carotid stenosis measurements (when applicable) are obtained utilizing NASCET criteria, using the distal internal carotid diameter as the denominator.  Contrast: 50mL OMNIPAQUE IOHEXOL 350 MG/ML SOLN  Comparison:  CT head without contrast 01/23/2013.  CTA NECK  Findings:  A standard three-vessel arch configuration is present. The vertebral arteries originate from the subclavian arteries bilaterally.  The left vertebral artery is the dominant vessel. There are no significant stenoses within the neck.  The right common carotid artery is within normal limits. The bifurcation is unremarkable.  Right internal  carotid artery is normal.  The left common carotid artery is within normal limits.  The there is mild irregularity at the bifurcation.  There is no significant stenosis relative to the distal vessel.  The soft tissues of the neck are unremarkable.  The lung apices are clear.  The bone windows are unremarkable.   Review of the MIP images confirms the above findings.  IMPRESSION:  1.  No significant stenosis of the cervical vasculature. 2.  Mild atherosclerotic irregularity of the left carotid bifurcation without significant stenosis.  CTA HEAD  Findings:  The posterior left frontal and parietal lobe cortical infarct is again noted.  There is no significant change in size. There is no hemorrhage.  Remote infarcts of the anterior basal ganglia are again noted bilaterally.  Additional periventricular white matter hypoattenuation is similar to the prior study.  The postcontrast images demonstrate no pathologic enhancement. There may be some luxury perfusion within the posterior left MCA territory infarct.  Mild irregularity is present within the cavernous carotid arteries bilaterally.  The A1 and M1 segments are normal.  There is irregular segmental attenuation of MCA branch vessels bilaterally.  The posterior left MCA branch vessel is occluded, corresponding to the infarct territory.  The AICA vessels are dominant bilaterally.  The basilar artery is within normal limits.  The right posterior cerebral artery receives contributions from a posterior communicating artery and the P1 segment.  The left posterior cerebral artery originates from the basilar tip.  There is mild irregularity of distal PCA branch vessels without significant proximal  stenosis or occlusion.   Review of the MIP images confirms the above findings.  IMPRESSION:  1.  Occluded distal left MCA branch vessel corresponding to the infarct territory. 2.  Moderate small vessel disease. 3.  Mild atherosclerotic irregularity within the cavernous carotid arteries  bilaterally without significant stenosis.   Original Report Authenticated By: Marin Roberts, M.D.    Dg Chest 2 View  01/23/2013  *RADIOLOGY REPORT*  Clinical Data: Memory loss.  Altered mental status.  CHEST - 2 VIEW  Comparison: None.  Findings: Mild lingular scarring is noted.  Lungs otherwise clear. No evidence of pleural effusion.  Heart size and mediastinal contours are normal.  IMPRESSION: No active disease.   Original Report Authenticated By: Myles Rosenthal, M.D.    Ct Head Wo Contrast  01/23/2013  *RADIOLOGY REPORT*  Clinical Data: Amnesia and memory loss.  Postop from pelvic surgery several days ago.  CT HEAD WITHOUT CONTRAST  Technique:  Contiguous axial images were obtained from the base of the skull through the vertex without contrast.  Comparison: None.  Findings: A well circumscribed area of cytotoxic edema is seen in the left parietal lobe, consistent with early subacute infarct. There is no evidence of hemorrhage, mass effect, or midline shift.  No other definite areas of acute to subacute cerebral infarction identified.  Chronic small vessel disease and old deep white matter infarcts are seen bilaterally.  No evidence of hydrocephalus or abnormal extra-axial fluid collections.  No skull abnormality identified.  IMPRESSION:  1.  Early subacute left parietal lobe infarct. 2.  No evidence of intracranial hemorrhage, mass effect, or midline shift. 3.  Chronic small vessel disease and old deep white matter infarcts.   Original Report Authenticated By: Myles Rosenthal, M.D.    Ct Angio Neck W/cm &/or Wo/cm  01/24/2013  *RADIOLOGY REPORT*  Clinical Data:  Hypertension.  Follow-up CVA.  Persistent expressive aphasia.  CT ANGIOGRAPHY HEAD AND NECK  Technique:  Multidetector CT imaging of the head and neck was performed using the standard protocol during bolus administration of intravenous contrast.  Multiplanar CT image reconstructions including MIPs were obtained to evaluate the vascular anatomy.  Carotid stenosis measurements (when applicable) are obtained utilizing NASCET criteria, using the distal internal carotid diameter as the denominator.  Contrast: 50mL OMNIPAQUE IOHEXOL 350 MG/ML SOLN  Comparison:  CT head without contrast 01/23/2013.  CTA NECK  Findings:  A standard three-vessel arch configuration is present. The vertebral arteries originate from the subclavian arteries bilaterally.  The left vertebral artery is the dominant vessel. There are no significant stenoses within the neck.  The right common carotid artery is within normal limits. The bifurcation is unremarkable.  Right internal carotid artery is normal.  The left common carotid artery is within normal limits.  The there is mild irregularity at the bifurcation.  There is no significant stenosis relative to the distal vessel.  The soft tissues of the neck are unremarkable.  The lung apices are clear.  The bone windows are unremarkable.   Review of the MIP images confirms the above findings.  IMPRESSION:  1.  No significant stenosis of the cervical vasculature. 2.  Mild atherosclerotic irregularity of the left carotid bifurcation without significant stenosis.  CTA HEAD  Findings:  The posterior left frontal and parietal lobe cortical infarct is again noted.  There is no significant change in size. There is no hemorrhage.  Remote infarcts of the anterior basal ganglia are again noted bilaterally.  Additional periventricular white matter hypoattenuation is similar  to the prior study.  The postcontrast images demonstrate no pathologic enhancement. There may be some luxury perfusion within the posterior left MCA territory infarct.  Mild irregularity is present within the cavernous carotid arteries bilaterally.  The A1 and M1 segments are normal.  There is irregular segmental attenuation of MCA branch vessels bilaterally.  The posterior left MCA branch vessel is occluded, corresponding to the infarct territory.  The AICA vessels are dominant  bilaterally.  The basilar artery is within normal limits.  The right posterior cerebral artery receives contributions from a posterior communicating artery and the P1 segment.  The left posterior cerebral artery originates from the basilar tip.  There is mild irregularity of distal PCA branch vessels without significant proximal stenosis or occlusion.   Review of the MIP images confirms the above findings.  IMPRESSION:  1.  Occluded distal left MCA branch vessel corresponding to the infarct territory. 2.  Moderate small vessel disease. 3.  Mild atherosclerotic irregularity within the cavernous carotid arteries bilaterally without significant stenosis.   Original Report Authenticated By: Marin Roberts, M.D.        Microbiology: No results found for this or any previous visit (from the past 240 hour(s)).   Labs: Results for orders placed during the hospital encounter of 01/23/13 (from the past 48 hour(s))  COMPREHENSIVE METABOLIC PANEL     Status: Abnormal   Collection Time    01/27/13  9:48 AM      Result Value Range   Sodium 139  135 - 145 mEq/L   Potassium 3.4 (*) 3.5 - 5.1 mEq/L   Chloride 104  96 - 112 mEq/L   CO2 24  19 - 32 mEq/L   Glucose, Bld 117 (*) 70 - 99 mg/dL   BUN 7  6 - 23 mg/dL   Creatinine, Ser 5.28  0.50 - 1.10 mg/dL   Calcium 8.8  8.4 - 41.3 mg/dL   Total Protein 7.5  6.0 - 8.3 g/dL   Albumin 3.2 (*) 3.5 - 5.2 g/dL   AST 19  0 - 37 U/L   ALT 21  0 - 35 U/L   Alkaline Phosphatase 58  39 - 117 U/L   Total Bilirubin 0.2 (*) 0.3 - 1.2 mg/dL   GFR calc non Af Amer >90  >90 mL/min   GFR calc Af Amer >90  >90 mL/min   Comment:            The eGFR has been calculated     using the CKD EPI equation.     This calculation has not been     validated in all clinical     situations.     eGFR's persistently     <90 mL/min signify     possible Chronic Kidney Disease.  HEPARIN LEVEL (UNFRACTIONATED)     Status: None   Collection Time    01/27/13  1:55 PM      Result  Value Range   Heparin Unfractionated 0.39  0.30 - 0.70 IU/mL   Comment:            IF HEPARIN RESULTS ARE BELOW     EXPECTED VALUES, AND PATIENT     DOSAGE HAS BEEN CONFIRMED,     SUGGEST FOLLOW UP TESTING     OF ANTITHROMBIN III LEVELS.  HEPARIN LEVEL (UNFRACTIONATED)     Status: None   Collection Time    01/28/13  7:30 AM      Result Value Range   Heparin  Unfractionated 0.32  0.30 - 0.70 IU/mL   Comment:            IF HEPARIN RESULTS ARE BELOW     EXPECTED VALUES, AND PATIENT     DOSAGE HAS BEEN CONFIRMED,     SUGGEST FOLLOW UP TESTING     OF ANTITHROMBIN III LEVELS.  CBC     Status: Abnormal   Collection Time    01/28/13  7:30 AM      Result Value Range   WBC 5.4  4.0 - 10.5 K/uL   RBC 5.04  3.87 - 5.11 MIL/uL   Hemoglobin 11.8 (*) 12.0 - 15.0 g/dL   HCT 54.0 (*) 98.1 - 19.1 %   MCV 69.4 (*) 78.0 - 100.0 fL   MCH 23.4 (*) 26.0 - 34.0 pg   MCHC 33.7  30.0 - 36.0 g/dL   RDW 47.8 (*) 29.5 - 62.1 %   Platelets 228  150 - 400 K/uL  PROTIME-INR     Status: Abnormal   Collection Time    01/28/13  7:30 AM      Result Value Range   Prothrombin Time 17.9 (*) 11.6 - 15.2 seconds   INR 1.52 (*) 0.00 - 1.49  BASIC METABOLIC PANEL     Status: Abnormal   Collection Time    01/28/13  7:30 AM      Result Value Range   Sodium 138  135 - 145 mEq/L   Potassium 3.4 (*) 3.5 - 5.1 mEq/L   Chloride 105  96 - 112 mEq/L   CO2 25  19 - 32 mEq/L   Glucose, Bld 97  70 - 99 mg/dL   BUN 8  6 - 23 mg/dL   Creatinine, Ser 3.08  0.50 - 1.10 mg/dL   Calcium 8.7  8.4 - 65.7 mg/dL   GFR calc non Af Amer >90  >90 mL/min   GFR calc Af Amer >90  >90 mL/min   Comment:            The eGFR has been calculated     using the CKD EPI equation.     This calculation has not been     validated in all clinical     situations.     eGFR's persistently     <90 mL/min signify     possible Chronic Kidney Disease.  HEPARIN LEVEL (UNFRACTIONATED)     Status: None   Collection Time    01/29/13  6:27 AM       Result Value Range   Heparin Unfractionated 0.44  0.30 - 0.70 IU/mL   Comment:            IF HEPARIN RESULTS ARE BELOW     EXPECTED VALUES, AND PATIENT     DOSAGE HAS BEEN CONFIRMED,     SUGGEST FOLLOW UP TESTING     OF ANTITHROMBIN III LEVELS.  CBC     Status: Abnormal   Collection Time    01/29/13  6:27 AM      Result Value Range   WBC 5.3  4.0 - 10.5 K/uL   RBC 5.13 (*) 3.87 - 5.11 MIL/uL   Hemoglobin 11.9 (*) 12.0 - 15.0 g/dL   HCT 84.6 (*) 96.2 - 95.2 %   MCV 70.0 (*) 78.0 - 100.0 fL   MCH 23.2 (*) 26.0 - 34.0 pg   MCHC 33.1  30.0 - 36.0 g/dL   RDW 84.1 (*) 32.4 - 40.1 %   Platelets 292  150 - 400 K/uL  PROTIME-INR     Status: Abnormal   Collection Time    01/29/13  6:27 AM      Result Value Range   Prothrombin Time 21.4 (*) 11.6 - 15.2 seconds   INR 1.94 (*) 0.00 - 1.49  BASIC METABOLIC PANEL     Status: None   Collection Time    01/29/13  6:27 AM      Result Value Range   Sodium 140  135 - 145 mEq/L   Potassium 3.8  3.5 - 5.1 mEq/L   Chloride 106  96 - 112 mEq/L   CO2 24  19 - 32 mEq/L   Glucose, Bld 99  70 - 99 mg/dL   BUN 8  6 - 23 mg/dL   Creatinine, Ser 4.09  0.50 - 1.10 mg/dL   Calcium 8.8  8.4 - 81.1 mg/dL   GFR calc non Af Amer >90  >90 mL/min   GFR calc Af Amer >90  >90 mL/min   Comment:            The eGFR has been calculated     using the CKD EPI equation.     This calculation has not been     validated in all clinical     situations.     eGFR's persistently     <90 mL/min signify     possible Chronic Kidney Disease.   Transesophageal echo FINDINGS:  1. LEFT VENTRICLE: The left ventricle has mild LVH and normal function. Wall motion is normal. No thrombus or masses seen in the left ventricle. 2. RIGHT VENTRICLE: The right ventricle is normal in structure and function without any thrombus or masses.  3. LEFT ATRIUM: The left atrium is normal without any thrombus or masses. 4. LEFT ATRIAL APPENDAGE: The left atrial appendage is free of any  thrombus or masses. 5. RIGHT ATRIUM: The right atrium is free of any thrombus or masses.  6. ATRIAL SEPTUM: The atrial septum is normal without any ASD or PFO with agitated saline injection. 7. MITRAL VALVE: The mitral valve is normal in structure and function with trivial regurgitation, no masses, stenosis or vegetations. 8. TRICUSPID VALVE: The tricuspid valve is normal in structure and function without regurgitation, masses, stenosis or vegetations. 9. AORTIC VALVE: The aortic valve is normal in structure and function without regurgitation, masses, stenosis or vegetations. 10. PULMONIC VALVE: The pulmonic valve is normal in structure and function without regurgitation, masses, stenosis or vegetations. 11. AORTIC ARCH, ASCENDING AND DESCENDING AORTA: The aorta had minimal atherosclerosis in the ascending or descending aorta. The aortic arch was normal. IMPRESSION:  1.Normal LV systolic function.  2.No vegetation, clot or PFO.  3.Minimal atherosclerosis in descending thoracic aorta.     HPI :Mary [name] is a 47 y.o. female who presents with several days of difficulty speaking. She had an ovarian cyst removed on Monday 01/21/13, and immediately following surgery her family noticed that she was having difficulty with her speech, those relatively mild. She is discharged, however did not improve after she got home and therefore her family has brought her back in for reevaluation tonight 01/23/2013. She states that she has trouble both with getting her words out, as well as understanding other people  She had a CT scan showing a subacute left parietal infarct. She had previously been prescribed phentermine, however has never taken it. Patient was not a TPA candidate secondary to delay in arrival. She was admitted for further evaluation and  treatment   HOSPITAL COURSE:  #1 acute CVA, workup as follows  Hypercoagulable Panel:  Normal: C3, C4, ANA, RPR, HIV, Anti III, homocysteine  ESR - 26   Pending: CH50  Abnormal Prot C, Prot S, lupus anticoagulant, cardiolipin antibody  Pregnancy screen - negative  CT of the brain 01/23/2013 1. Early subacute left parietal lobe infarct. 2. No evidence of intracranial hemorrhage, mass effect, or midline shift. 3. Chronic small vessel disease and old deep white matter infarcts.  CT angio of the head 1. Occluded distal left MCA branch vessel corresponding to the infarct territory. 2. Moderate small vessel disease. 3. Mild atherosclerotic irregularity within the cavernous carotid arteries bilaterally without significant stenosis.  CT angio of the neck 1. No significant stenosis of the cervical vasculature. 2. Mild atherosclerotic irregularity of the left carotid bifurcation without significant stenosis.  MRI of the brain Open MRI to be done on an outpatient basis secondary to severe claustrophobia.  2D Echocardiogram ejection fraction 60-65%. No source of emboli noted. TEE is planned.  Carotid Doppler See CTA of the neck  LE venous dopplers Bilateral lower extremities are positive for deep vein thrmbosis involving bilateral posterior tibial veins. Unable to visualize bilateral femoral veins due to technical limitations, therefore unable to exclude deep vein thrombosis in these veins.  TEE 1.Normal LV systolic function. 2.No vegetation, clot or PFO. 3.Minimal atherosclerosis in descending thoracic aorta.  CXR 01/23/2013 No active disease.  EKG normal sinus rhythm.  Therapy Recommendations - Outpatient PT and OT with 24-hour per day supervision/assistance recommended. OP ST.    she presented with speech difficulties, trouble with expression,Imaging confirms a left parietal lobe infarct. Infarct felt to be embolic. On no antiplatelets prior to admission. Initially started on intravenous heparin with Coumadin dosing per pharmacy for secondary stroke prevention, given bilateral lower extremity DVTs, and hypercoagulable state. TEE was done which was negative for  PFO/ASD.  Patient with resultant expressive and receptive aphasia (paraphasic errors) and right visual field cut Obesity, Body mass index is 45.13 kg/(m^2).  Patient has been switched to xarelto for stroke prevention Follow up Dr. Pearlean Brownie in 2 months Plan open MRI as an OP in the future Outpatient occupational, physical and speech therapies   #2 DVT Initiated with IV heparin and Coumadin On switched to xarelto which she will continue for another 6 months   #3. Hypertension -able on Norvasc  #4. Recent ovarian cyst surgery - can contact [facility] Dr. Chester Holstein at [phone] for further details     Discharge Exam:   Blood pressure 123/79, pulse 75, temperature 98.3 F (36.8 C), temperature source Oral, resp. rate 20, height 5\' 5"  (1.651 m), weight 123.016 kg (271 lb 3.2 oz), SpO2 97.00%.  Cardiovascular: Normal rate, regular rhythm, normal heart sounds and intact distal pulses.  Pulmonary/Chest: Effort normal and breath sounds normal. No respiratory distress.  Abdominal: Soft. Normal appearance and bowel sounds are normal. She exhibits no distension. There is no tenderness.  Musculoskeletal: She exhibits no edema and no tenderness.  Neurological: She is alert. No cranial nerve deficit.             Follow-up Information   Follow up with pcp. Schedule an appointment as soon as possible for a visit in 1 week.      SignedRicharda Overlie 01/29/2013, 8:44 AM

## 2013-01-29 NOTE — Progress Notes (Signed)
Stroke Team Progress Note  HISTORY Mary [name] is a 47 y.o. female who presents with several days of difficulty speaking. She had an ovarian cyst removed on Monday 01/21/13, and immediately following surgery her family noticed that she was having difficulty with her speech, those relatively mild. She is discharged, however did not improve after she got home and therefore her family has brought her back in for reevaluation tonight 01/23/2013. She states that she has trouble both with getting her words out, as well as understanding other people  She had a CT scan showing a subacute left parietal infarct. She had previously been prescribed phentermine, however has never taken it. Patient was not a TPA candidate secondary to delay in arrival. She was admitted for further evaluation and treatment.  SUBJECTIVE Patient with ST at the bedside. Both feel she is making progress.  OBJECTIVE Most recent Vital Signs: Filed Vitals:   01/28/13 2120 01/29/13 0230 01/29/13 0619 01/29/13 1039  BP: 122/76 130/70 123/79 134/80  Pulse: 78 71 75 82  Temp: 97.6 F (36.4 C) 98 F (36.7 C) 98.3 F (36.8 C) 98.1 F (36.7 C)  TempSrc: Oral Oral Oral Oral  Resp: 20 20 20 20   Height:      Weight:      SpO2: 97% 95% 97% 95%   CBG (last 3)  No results found for this basename: GLUCAP,  in the last 72 hours  IV Fluid Intake:      MEDICATIONS . amLODipine  10 mg Oral q morning - 10a  . rivaroxaban  15 mg Oral BID   PRN:  acetaminophen, senna-docusate  Diet:  Cardiac thin liquids Activity:  As tolerated DVT Prophylaxis:  xarelto  CLINICALLY SIGNIFICANT STUDIES Basic Metabolic Panel:   Recent Labs Lab 01/28/13 0730 01/29/13 0627  NA 138 140  K 3.4* 3.8  CL 105 106  CO2 25 24  GLUCOSE 97 99  BUN 8 8  CREATININE 0.71 0.73  CALCIUM 8.7 8.8   Liver Function Tests:   Recent Labs Lab 01/27/13 0948  AST 19  ALT 21  ALKPHOS 58  BILITOT 0.2*  PROT 7.5  ALBUMIN 3.2*   CBC:   Recent  Labs Lab 01/23/13 2027 01/24/13 0555  01/28/13 0730 01/29/13 0627  WBC 6.7 6.1  < > 5.4 5.3  NEUTROABS 4.1 3.7  --   --   --   HGB 13.1 11.8*  < > 11.8* 11.9*  HCT 38.6 35.4*  < > 35.0* 35.9*  MCV 69.9* 69.0*  < > 69.4* 70.0*  PLT 291 258  < > 228 292  < > = values in this interval not displayed. Coagulation:   Recent Labs Lab 01/26/13 0244 01/27/13 0600 01/28/13 0730 01/29/13 0627  LABPROT 14.2 13.8 17.9* 21.4*  INR 1.11 1.07 1.52* 1.94*   Cardiac Enzymes: No results found for this basename: CKTOTAL, CKMB, CKMBINDEX, TROPONINI,  in the last 168 hours Urinalysis:   Recent Labs Lab 01/24/13 0607  COLORURINE YELLOW  LABSPEC 1.017  PHURINE 5.5  GLUCOSEU NEGATIVE  HGBUR LARGE*  BILIRUBINUR NEGATIVE  KETONESUR NEGATIVE  PROTEINUR NEGATIVE  UROBILINOGEN 1.0  NITRITE NEGATIVE  LEUKOCYTESUR NEGATIVE   Lipid Panel    Component Value Date/Time   CHOL 128 01/24/2013 0555   TRIG 108 01/24/2013 0555   HDL 41 01/24/2013 0555   CHOLHDL 3.1 01/24/2013 0555   VLDL 22 01/24/2013 0555   LDLCALC 65 01/24/2013 0555   HgbA1C  Lab Results  Component Value Date  HGBA1C 6.1* 01/24/2013   Urine Drug Screen:   No results found for this basename: labopia,  cocainscrnur,  labbenz,  amphetmu,  thcu,  labbarb    Alcohol Level: No results found for this basename: ETH,  in the last 168 hours  Hypercoagulable Panel: Normal:  C3, C4, ANA, RPR, HIV, Anti III, homocysteine ESR - 26 CH50 > 60 Abnormal:  Prot C, Prot S, lupus anticoagulant, cardiolipin antibody  Pregnancy screen - negative  CT of the brain  01/23/2013   1.  Early subacute left parietal lobe infarct. 2.  No evidence of intracranial hemorrhage, mass effect, or midline shift. 3.  Chronic small vessel disease and old deep white matter infarcts.    CT angio of the head 1. Occluded distal left MCA branch vessel corresponding to the infarct territory. 2. Moderate small vessel disease. 3. Mild atherosclerotic irregularity within the  cavernous carotid arteries bilaterally without significant stenosis.  CT angio of the neck 1. No significant stenosis of the cervical vasculature. 2. Mild atherosclerotic irregularity of the left carotid bifurcation without significant stenosis.  MRI of the brain  Open MRI to be done on an outpatient basis secondary to severe claustrophobia.   2D Echocardiogram  ejection fraction 60-65%. No source of emboli noted. TEE is planned.  Carotid Doppler  See CTA of the neck  LE venous dopplers  Bilateral lower extremities are positive for deep vein thrmbosis involving bilateral posterior tibial veins. Unable to visualize bilateral femoral veins due to technical limitations, therefore unable to exclude deep vein thrombosis in these veins.  TEE  1.Normal LV systolic function. 2.No vegetation, clot or PFO. 3.Minimal atherosclerosis in descending thoracic aorta.  CXR  01/23/2013   No active disease.     EKG  normal sinus rhythm.   Therapy Recommendations - Outpatient PT and OT with 24-hour per day supervision/assistance recommended. OP ST.  Physical Exam   Pleasant middle aged  [place] lady currently not in distress. Appears frustrated from her speech difficulties.Awake alert. Afebrile. Head is nontraumatic. Neck is supple without bruit. Hearing is normal. Cardiac exam no murmur or gallop. Lungs are clear to auscultation. Distal pulses are well felt. Neurological Exam :  Awake alert oriented x3. Moderate expressive aphasia with significant nonfluent speech with word finding difficulties and paraphasic errors. Able to name, repeat and comprehend better with mild deficits. No dysarthria. Extraocular moments are full range without nystagmus. Fundi were not visualized. Vision acuity and fields appear normal. Face is symmetric. Tongue is midline. Motor system exam reveals no upper or lower expected drift. His symmetric and equal strength in all 4 extremities. No focal weakness.  General - pleasant  47 year old female in no acute distress. Heart - Regular rate and rhythm - 2/6 systolic murmur Lungs - Clear to auscultation but slightly decreased Extremities - Distal pulses weak to absent bilaterally trace edema. Skin - Warm and dry  ASSESSMENT [name] is a 47 y.o. female presenting with speech difficulties, trouble getting words out and trouble understanding. Imaging confirms a left parietal lobe infarct. Infarct felt to be embolic. On no antiplatelets prior to admission. Now changed from intravenous heparin with Coumadin to xarelto for secondary stroke prevention, bilateral lower extremity DVTs, and hypercoagulable state. Patient with resultant expressive and receptive aphasia (paraphasic errors) and right visual field cut. Work up completed.  Hypertension - Norvasc Morbid obesity, Body mass index is 45.13 kg/(m^2). Claustrophobia - outpatient open MRI planned Hypercoagulable panel - Abnormally high protein C 153, abnormally low  protein S. 55,  Positive lupus anticoagulant, Cardiolipin abnormally low. Ovarian cyst resection 01/21/2013 Bilateral lower extremity DVTs   Hospital day # 6  TREATMENT/PLAN  xarelto for secondary stroke prevention  open MRI as an OP in the future  Outpatient occupational, physical and speech therapies.   Follow up Dr. Pearlean Brownie in 2 months  Annie Main, MSN, RN, ANVP-BC, ANP-BC, [facility] Pager: [phone] 01/29/2013 10:44 AM  I have personally obtained a history, examined the patient, evaluated imaging results, and formulated the assessment and plan of care. I agree with the above.  Delia Heady, MD Medical Director [facility] Pager: [phone] 01/29/2013 10:44 AM

## 2013-01-29 NOTE — Progress Notes (Signed)
Patient discharge this evening, d/c and medication papers were given. assessments remained unchanged prior to d/c Prescriptions and instructions given to her and family members, all questions answered.

## 2013-01-29 NOTE — Progress Notes (Signed)
Speech Language Pathology Treatment Patient Details Name: Mary [name] MRN: [id number] DOB: 10/21/1966 Today's Date: 01/29/2013 Time: 4098-1191 SLP Time Calculation (min): 52 min  Assessment / Plan / Recommendation Clinical Impression  Treatment focused on functional communication across modalities and compensations, tasks to complete at home with family assist prior to outpt slp visit to maximize pt's rehabilitation.    Pt able to follow 2 step commands without difficulties today - 3 steps commands breakdown, therefore goal advanced.    Expressive language addressed with pt completing phrase completion with 5/7 independently, 2/7 with phonemic or written cue.  Pt reports use of communication board to be helpful yesterday.  Phonemic paraphasia with dysfluencies continue but with good pt awareness, again use of phonemic cues, pt reading single word (written by slp) factilitated expressive language.    Pt was able to write her name - due to automatic process but other written output mirrors verbal attempts.- not successful at this time (unable to copy county name).    Advised pt to work on 3 step commands, use of compensatory strategies (family, co-communicators need to help) with expressive language and automatic tasks (singing, phrase completion) to complete prior to outpt slp session.  All information was provided in writing for pt and family with slp contact number.  Family not present today during slp session.      Pt will benefit from intensive slp therapy to maximize her functional communication.  Agree that 24/7 supervision indicated in this slp's opinion due to communication deficits.      SLP Plan  Continue with current plan of care;Goals updated    Pertinent Vitals/Pain Afebrile  SLP Goals  SLP Goals Potential to Achieve Goals: Fair Progress/Goals/Alternative treatment plan discussed with pt/caregiver and they: Agree (pt with neurologist ) SLP Goal #1: Pt will follow two step  commands during functional tasks with mod verbal/visual cues.- three step commands SLP Goal #1 - Progress: Revised (modified due to lack of progress/goal met) SLP Goal #2: Pt will use compensatory strategies for expressive communication at phrase level with maximum verbal/visual cues and 80% accuracy.  SLP Goal #2 - Progress: Progressing toward goal SLP Goal #3: Pt will demonstrate ability to comprehend written language at phrase level with mod verbal/visual cues.   SLP Goal #3 - Progress: Not met SLP Goal #4: Pt will name functional daily items with moderate verbal, articulatory phrase compeltion cues and 80% accuracy.  SLP Goal #4 - Progress: Progressing toward goal SLP Goal #5: Family will demonstrate use of compensatory strategies to maximize pt's functional expressive language with moderate verbal/visual cues.   SLP Goal #5 - Progress: Not met (family not present during today's session)  General Temperature Spikes Noted: No Respiratory Status: Room air Behavior/Cognition: Alert;Cooperative Oral Cavity - Dentition: Adequate natural dentition Patient Positioning: Upright in chair    Treatment Treatment focused on: Aphasia Skilled Treatment: SLP verbal visual cues, compensatory strategies, language facilitation, functional tasks, augmentative communication- (comm board), pt education, written language tasks to complete at home prior to slp visit  GO     Donavan Burnet, MS [facility] SLP [phone]

## 2013-02-05 ENCOUNTER — Emergency Department (HOSPITAL_COMMUNITY)
Admission: EM | Admit: 2013-02-05 | Discharge: 2013-02-05 | Disposition: A | Discharge status: Home / Self Care | Payer: Medicaid Other | Source: Home / Self Care | Attending: Family Medicine | Admitting: Family Medicine

## 2013-02-05 ENCOUNTER — Encounter (HOSPITAL_COMMUNITY): Payer: Self-pay

## 2013-02-05 DIAGNOSIS — I6350 Cerebral infarction due to unspecified occlusion or stenosis of unspecified cerebral artery: Secondary | ICD-10-CM

## 2013-02-05 DIAGNOSIS — I639 Cerebral infarction, unspecified: Secondary | ICD-10-CM

## 2013-02-05 DIAGNOSIS — I10 Essential (primary) hypertension: Secondary | ICD-10-CM

## 2013-02-05 DIAGNOSIS — I82402 Acute embolism and thrombosis of unspecified deep veins of left lower extremity: Secondary | ICD-10-CM

## 2013-02-05 DIAGNOSIS — I82409 Acute embolism and thrombosis of unspecified deep veins of unspecified lower extremity: Secondary | ICD-10-CM

## 2013-02-05 HISTORY — DX: Cerebral infarction, unspecified: I63.9

## 2013-02-05 MED ORDER — AMLODIPINE BESYLATE 10 MG PO TABS: 10.0000 mg | ORAL_TABLET | Freq: Every morning | ORAL | Status: DC

## 2013-02-05 NOTE — ED Provider Notes (Signed)
History     CSN: [id number]  Arrival date & time 02/05/13  1210   First MD Initiated Contact with Patient 02/05/13 1234      Chief Complaint  Patient presents with  . Follow-up    HPI Patient presenting today in followup from recent hospitalization where she was diagnosed with a acute stroke.  This is her second stroke.  She reports that she had residual deficits with memory and speech from this recent stroke.  These are improving now.  She is getting home physical therapy and occupational therapy and speech therapy.  The patient also was diagnosed with DVT in the left lower extremity.  She was started on Xarelto by the neurologist.  She is supposed to followup with the neurologist in the next couple months.  In addition, the patient has no medical insurance.  The patient received assistance via the hospital for her current medications.  She does not know what she is going to do to get them filled after the end of March.  The patient reports that she has enough medication to last until that time.  She is currently in the process of trying to apply for orange discount card benefits.  The patient also lives in [place] and has difficulty with transportation to get to this clinic.  She's not sure that she's going to be able to make the appointments in the future.  The patient is not allowed to drive at this time.  Past Medical History  Diagnosis Date  . Hypertension   . Other and unspecified ovarian cysts   . Stroke     Past Surgical History  Procedure Laterality Date  . Ovarian cyst removal    . Tee without cardioversion N/A 01/25/2013    Procedure: TRANSESOPHAGEAL ECHOCARDIOGRAM (TEE);  Surgeon: Ricki Rodriguez, MD;  Location: [facility] ENDOSCOPY;  Service: Cardiovascular;  Laterality: N/A;    Family History  Problem Relation Age of Onset  . Diabetes Mellitus II Mother   . Stroke Other   . Transient ischemic attack Father     History  Substance Use Topics  . Smoking status: Never Smoker    . Smokeless tobacco: Not on file  . Alcohol Use: No    OB History   Grav Para Term Preterm Abortions TAB SAB Ect Mult Living                 Review of Systems  Constitutional: Positive for fatigue and unexpected weight change.  Neurological: Positive for facial asymmetry, speech difficulty and weakness.  All other systems reviewed and are negative.   Allergies  Review of patient's allergies indicates no known allergies.  Home Medications   Current Outpatient Rx  Name  Route  Sig  Dispense  Refill  . amLODipine (NORVASC) 10 MG tablet   Oral   Take 1 tablet (10 mg total) by mouth every morning.   30 tablet   2   . Rivaroxaban (XARELTO) 15 MG TABS tablet   Oral   Take 1 tablet (15 mg total) by mouth 2 (two) times daily.   42 tablet   0   . Rivaroxaban (XARELTO) 20 MG TABS   Oral   Take 1 tablet (20 mg total) by mouth daily.   30 tablet   6     BP 139/82  Pulse 66  Temp(Src) 98.3 F (36.8 C) (Oral)  SpO2 99%  Physical Exam  Nursing note and vitals reviewed. Constitutional: She is oriented to person, place, and time.  She appears well-developed and well-nourished. No distress.  HENT:  Head: Normocephalic and atraumatic.  Eyes: Conjunctivae and EOM are normal. Pupils are equal, round, and reactive to light.  Neck: Normal range of motion. Neck supple. No JVD present. No tracheal deviation present. No thyromegaly present.  Cardiovascular: Normal rate, regular rhythm and normal heart sounds.   Pulmonary/Chest: Effort normal and breath sounds normal. No respiratory distress. She has no wheezes. She has no rales. She exhibits no tenderness.  Abdominal: Soft. Bowel sounds are normal. She exhibits no distension and no mass. There is no tenderness. There is no rebound and no guarding.  Musculoskeletal: Normal range of motion. She exhibits no edema and no tenderness.  Lymphadenopathy:    She has no cervical adenopathy.  Neurological: She is alert and oriented to  person, place, and time. No cranial nerve deficit. She exhibits normal muscle tone. Coordination normal.  Moderately slowed thought processes but coherent speech noted  Skin: Skin is warm and dry.  Psychiatric: She has a normal mood and affect. Her behavior is normal. Judgment and thought content normal.    ED Course  Procedures (including critical care time)  Labs Reviewed - No data to display No results found.   1. HTN (hypertension)   2. CVA (cerebral infarction)   3. DVT (deep venous thrombosis), left    MDM  IMPRESSION  Her blood pressures are well controlled at this time on amlodipine daily  I had a long discussion with her about the importance of continuing to take the Xarelto daily  I encouraged the patient to try and get an orange discount card for assistance with her medications.  The patient is currently deciding if she is going to go to the free clinic in [facility] which is closer to her home.  I encouraged the patient to be sure she follows up with a neurologist as scheduled in the next month.  I did the patient information regarding [place] [facility] the telephone number and address with instructions to call for appointment.  The patient verbalized understanding and agreed to followup with neurology.  The patient is not clear to drive a motor vehicle or operate machinery until she's been evaluated by the neurologist on followup.  I reviewed her hospital records and discharge summary and consultation notes.  FOLLOW UP 2 months  The patient was given clear instructions to go to ER or return to medical center if symptoms don't improve, worsen or new problems develop.  The patient verbalized understanding.  The patient was told to call to get lab results if they haven't heard anything in the next week.            Cleora Fleet, MD 02/05/13 1511

## 2013-02-05 NOTE — ED Notes (Signed)
folow up from [place]- DX stroke

## 2013-02-13 ENCOUNTER — Encounter: Payer: Self-pay | Admitting: Family Medicine

## 2013-02-16 ENCOUNTER — Encounter: Payer: Self-pay | Admitting: Family Medicine

## 2013-03-19 ENCOUNTER — Encounter: Payer: Self-pay | Admitting: Family Medicine

## 2013-04-05 ENCOUNTER — Encounter (HOSPITAL_COMMUNITY): Payer: Self-pay

## 2013-04-05 ENCOUNTER — Emergency Department (INDEPENDENT_AMBULATORY_CARE_PROVIDER_SITE_OTHER)
Admission: EM | Admit: 2013-04-05 | Discharge: 2013-04-05 | Disposition: A | Discharge status: Home Health | Payer: Medicaid Other | Source: Home / Self Care

## 2013-04-05 DIAGNOSIS — I6350 Cerebral infarction due to unspecified occlusion or stenosis of unspecified cerebral artery: Secondary | ICD-10-CM

## 2013-04-05 DIAGNOSIS — I10 Essential (primary) hypertension: Secondary | ICD-10-CM

## 2013-04-05 DIAGNOSIS — I82409 Acute embolism and thrombosis of unspecified deep veins of unspecified lower extremity: Secondary | ICD-10-CM

## 2013-04-05 MED ORDER — AMLODIPINE BESYLATE 10 MG PO TABS: 10.0000 mg | ORAL_TABLET | Freq: Every morning | ORAL | Status: DC

## 2013-04-05 NOTE — Progress Notes (Signed)
Patient Demographics  Mary [name], is a 47 y.o. female  VHQ:[id number]  UXL:[id number]  DOB - Oct 30, 1966  Chief Complaint  Patient presents with  . Hypertension        Subjective:   Mary [name] today is here for a follow up vist/to establish primary care. Patient has No headache, No chest pain, No abdominal pain - No Nausea, No new weakness tingling or numbness, No Cough - SOB.   Objective:    Filed Vitals:   04/05/13 1019  BP: 128/75  Pulse: 86  Temp: 97.8 F (36.6 C)  TempSrc: Oral  Resp: 17  SpO2: 100%     ALLERGIES:  No Known Allergies  PAST MEDICAL HISTORY: Past Medical History  Diagnosis Date  . Hypertension   . Other and unspecified ovarian cysts   . Stroke     MEDICATIONS AT HOME: Prior to Admission medications   Medication Sig Start Date End Date Taking? Authorizing Provider  amLODipine (NORVASC) 10 MG tablet Take 1 tablet (10 mg total) by mouth every morning. 04/05/13   Shanker Levora Dredge, MD  Rivaroxaban (XARELTO) 20 MG TABS Take 1 tablet (20 mg total) by mouth daily. 02/19/13   Richarda Overlie, MD     Exam  General appearance :Awake, alert, not in any distress. Speech - expressive aphasia. Not toxic Looking HEENT: Atraumatic and Normocephalic, pupils equally reactive to light and accomodation Neck: supple, no JVD. No cervical lymphadenopathy.  Chest:Good air entry bilaterally, no added sounds  CVS: S1 S2 regular, no murmurs.  Abdomen: Bowel sounds present, Non tender and not distended with no gaurding, rigidity or rebound. Extremities: B/L Lower Ext shows no edema, both legs are warm to touch Neurology: Awake alert, and oriented X 3, CN II-XII intact, Non focal Skin:No Rash Wounds:N/A    Data Review   CBC No results found for this basename: WBC, HGB, HCT, PLT, MCV, MCH, MCHC, RDW, NEUTRABS, LYMPHSABS, MONOABS, EOSABS, BASOSABS, BANDABS, BANDSABD,  in the last 168 hours  Chemistries   No results found for this basename: NA, K, CL, CO2,  GLUCOSE, BUN, CREATININE, GFRCGP, CALCIUM, MG, AST, ALT, ALKPHOS, BILITOT,  in the last 168 hours ------------------------------------------------------------------------------------------------------------------ No results found for this basename: HGBA1C,  in the last 72 hours ------------------------------------------------------------------------------------------------------------------ No results found for this basename: CHOL, HDL, LDLCALC, TRIG, CHOLHDL, LDLDIRECT,  in the last 72 hours ------------------------------------------------------------------------------------------------------------------ No results found for this basename: TSH, T4TOTAL, FREET3, T3FREE, THYROIDAB,  in the last 72 hours ------------------------------------------------------------------------------------------------------------------ No results found for this basename: VITAMINB12, FOLATE, FERRITIN, TIBC, IRON, RETICCTPCT,  in the last 72 hours  Coagulation profile  No results found for this basename: INR, PROTIME,  in the last 168 hours    Assessment & Plan   Active Problems: CVA with motor aphasia - Continue with speech therapy as outpatient - Not on aspirin as on Xarelto - Have asked the patient to followup with neurology-have given patient contact number for [facility] neurology  Bilateral lower extremity DVT in February 2014 - Lupus anticoagulant detected - Decreased protein S activity - Continue with Xarelto-may need lifelong anticoagulation  Hypertension - Controlled with amlodipine   Have ordered labs prior to next visit-please follow labs.  Follow-up Information   Follow up with [facility]. Schedule an appointment as soon as possible for a visit in 1 month.      Follow up with Gates Rigg, MD. Schedule an appointment as soon as possible for a visit in 2 weeks.   Contact information:   [address] [employer]  [address] [place] [address] [phone]

## 2013-04-05 NOTE — ED Notes (Signed)
Patient here for follow up- history of hypertension and stroke

## 2013-04-11 ENCOUNTER — Telehealth: Payer: Self-pay | Admitting: Neurology

## 2013-04-11 NOTE — Progress Notes (Signed)
This encounter was created in error - please disregard.

## 2013-04-11 NOTE — Telephone Encounter (Signed)
Patient called requesting follow up appointment for stroke in February. Appointment scheduled for August 27 at 2:45 p.m.

## 2013-04-15 ENCOUNTER — Ambulatory Visit (INDEPENDENT_AMBULATORY_CARE_PROVIDER_SITE_OTHER): Payer: Self-pay | Admitting: Neurology

## 2013-04-15 ENCOUNTER — Encounter: Payer: Self-pay | Admitting: Neurology

## 2013-04-15 VITALS — BP 128/77 | HR 80 | Temp 98.0°F | Ht 65.5 in | Wt 179.0 lb

## 2013-04-15 DIAGNOSIS — I4891 Unspecified atrial fibrillation: Secondary | ICD-10-CM

## 2013-04-15 DIAGNOSIS — I6350 Cerebral infarction due to unspecified occlusion or stenosis of unspecified cerebral artery: Secondary | ICD-10-CM

## 2013-04-15 NOTE — Patient Instructions (Addendum)
She was advised to stay on Xarelto for her history of deep vein thrombosis and maintain strict control of hypertension with blood pressure goal below 1:30/90. Check transcranial Doppler emboli monitoring and bubble study for PFO. Check prolonged outpatient cardiac telemetry for paroxysmal atrial fibrillation. Continue outpatient speech therapy. The patient is clearly significantly disabled due to her aphasia and was advised not to return back to work till her next followup visit with [place] in 3 months.

## 2013-04-15 NOTE — Progress Notes (Signed)
[facility] [address] [place]. [place] [address] [phone]       OFFICE FOLLOW-UP NOTE  Mary. Mary [name] Date of Birth:  [birth year] Medical Record Number:  [id number]   HPI: Mary [name] is a 55 year obese young lady seen for first office followup visit following hospital admission for stroke on 01/23/13. She presented with several days history of slurred speech and expressive language difficulties. Her symptoms actually began after an ovarian biopsy and she initially came to the ER and was discharged home but symptoms persisted and a few days later she returned and CT scan of the head this time showed a subacute left parietal MCA branch infarct. CT angiogram of the brain showed occluded distal branch of left middle cerebral artery corresponding to the infarct territory. CT angiogram of the neck showed no significant extracranial stenosis. MRI of the brain could not be obtained due to patient's obesity as well as severe claustrophobia. Transthoracic echo showed normal ejection fraction. Transesophageal echocardiogram showed no patent foramen ovale, vegetation or clot. EKG and telemetry monitoring did not reveal and cardiac arrhythmias. ESR,complements. ANA panel, RPR, HIV, antithrombin III and homocystine were all normal. The patient had significant expressive aphasia and was seen by physical, occupational and speech therapy and who was found to have deep vein thrombosis and hence was started on anticoagulation with xarelto. She was discharged  and has continued to have outpatient speech therapy and has obtained some modest improvement in her speech. She  still has significant word hesitancy but is able to communicate. She complains of mild headaches every other day for which she takes Tylenol which seemed to work quite well. She who works as a Production designer, theatre/television/film at [employer] and states she will be unable to perform her job in her current situation I wish she did significantly better ROS:    14 system review of systems is positive for blurred vision, feeling hot, feeling cold, memory loss, confusion, headache, slurred speech, snoring, restless sleep .  PMH:  Past Medical History  Diagnosis Date  . Hypertension   . Other and unspecified ovarian cysts   . Stroke     Social History:  History   Social History  . Marital Status: Single    Spouse Name: N/A    Number of Children: N/A  . Years of Education: N/A   Occupational History  . Not on file.   Social History Main Topics  . Smoking status: Never Smoker   . Smokeless tobacco: Not on file  . Alcohol Use: No  . Drug Use: No  . Sexually Active: Not on file   Other Topics Concern  . Not on file   Social History Narrative  . No narrative on file    Medications:   Current Outpatient Prescriptions on File Prior to Visit  Medication Sig Dispense Refill  . amLODipine (NORVASC) 10 MG tablet Take 1 tablet (10 mg total) by mouth every morning.  30 tablet  2  . Rivaroxaban (XARELTO) 20 MG TABS Take 1 tablet (20 mg total) by mouth daily.  30 tablet  6   No current facility-administered medications on file prior to visit.    Allergies:  No Known Allergies Filed Vitals:   04/15/13 1421  BP: 128/77  Pulse: 80  Temp: 98 F (36.7 C)   Physical Exam General: well developed, well nourished, seated, in no evident distress Head: head normocephalic and atraumatic. Orohparynx benign Neck: supple with no carotid or supraclavicular bruits Cardiovascular: regular rate  and rhythm, no murmurs Musculoskeletal: no deformity Skin:  no rash/petichiae Vascular:  Normal pulses all extremities  Neurologic Exam Mental Status: Awake and fully alert. Oriented to place and time. Recent and remote memory intact. Attention span, concentration and fund of knowledge appropriate. Mood and affect appropriate. modertae expressive aphasia with word finding difficulties, paraphasic errors and disfluency. Unable to name, repeat her right.  Good comprehension. Cranial Nerves: Fundoscopic exam reveals sharp disc margins. Pupils equal, briskly reactive to light. Extraocular movements full without nystagmus. Visual fields full to confrontation. Hearing intact. Facial sensation intact. Face, tongue, palate moves normally and symmetrically.  Motor: Normal bulk and tone. Normal strength in all tested extremity muscles. Sensory.: intact to tough and pinprick and vibratory.  Coordination: Rapid alternating movements normal in all extremities. Finger-to-nose and heel-to-shin performed accurately bilaterally. Gait and Station: Arises from chair without difficulty. Stance is normal. Gait demonstrates normal stride length and balance . Able to heel, toe and tandem walk without difficulty.  Reflexes: 1+ and symmetric. Toes downgoing.     ASSESSMENT:  68 year lady with subacute left parietal lobe infarct and February 2014 with significant residual expressive aphasia likely of embolic etiology without definite identified source of embolism. Vascular risk factors of hypertension, obesity and suspected sleep apnea . History of deep vein thrombosis on xarelto PLAN: Continue xarelto for deep vein thrombosis. Strict control of hypertension with blood pressure goal below 130/90.I advised her to diet, exercise and lose weight. Check polysomnogram for sleep apnea. Check transcranial Doppler bubble study with emboli monitoring. And MRI brain w/o as they have not been done The patient is clearly disabled from her significant aphasia and will be unable to work at the present time. She will return for followup in 3 months and we will reconsider her ability to work at that visit.

## 2013-04-18 ENCOUNTER — Encounter: Payer: Self-pay | Admitting: Family Medicine

## 2013-04-26 ENCOUNTER — Ambulatory Visit (INDEPENDENT_AMBULATORY_CARE_PROVIDER_SITE_OTHER): Payer: Medicaid Other | Admitting: Neurology

## 2013-04-26 ENCOUNTER — Encounter: Payer: Self-pay | Admitting: Neurology

## 2013-04-26 ENCOUNTER — Ambulatory Visit (INDEPENDENT_AMBULATORY_CARE_PROVIDER_SITE_OTHER): Payer: MEDICAID

## 2013-04-26 ENCOUNTER — Other Ambulatory Visit (INDEPENDENT_AMBULATORY_CARE_PROVIDER_SITE_OTHER): Payer: Medicaid Other

## 2013-04-26 VITALS — BP 121/80 | HR 80 | Temp 98.0°F

## 2013-04-26 DIAGNOSIS — Z0289 Encounter for other administrative examinations: Secondary | ICD-10-CM

## 2013-04-26 DIAGNOSIS — I6350 Cerebral infarction due to unspecified occlusion or stenosis of unspecified cerebral artery: Secondary | ICD-10-CM

## 2013-04-26 DIAGNOSIS — R4701 Aphasia: Secondary | ICD-10-CM

## 2013-04-26 NOTE — Progress Notes (Signed)
[facility] [address] [place]. [place] [address] [phone]       OFFICE FOLLOW-UP & TCD Bubble Study NOTE  [name] Date of Birth:  [birth year] Medical Record Number:  [id number]   HPI: Ms [name] is a 12 year obese young lady seen for first office followup visit following hospital admission for stroke on 01/23/13. She presented with several days history of slurred speech and expressive language difficulties. Her symptoms actually began after an ovarian biopsy and she initially came to the ER and was discharged home but symptoms persisted and a few days later she returned and CT scan of the head this time showed a subacute left parietal MCA branch infarct. CT angiogram of the brain showed occluded distal branch of left middle cerebral artery corresponding to the infarct territory. CT angiogram of the neck showed no significant extracranial stenosis. MRI of the brain could not be obtained due to patient's obesity as well as severe claustrophobia. Transthoracic echo showed normal ejection fraction. Transesophageal echocardiogram showed no patent foramen ovale, vegetation or clot. EKG and telemetry monitoring did not reveal and cardiac arrhythmias. ESR,complements. ANA panel, RPR, HIV, antithrombin III and homocystine were all normal. The patient had significant expressive aphasia and was seen by physical, occupational and speech therapy and who was found to have deep vein thrombosis and hence was started on anticoagulation with xarelto. She was discharged  and has continued to have outpatient speech therapy and has obtained some modest improvement in her speech. She  still has significant word hesitancy but is able to communicate. She complains of mild headaches every other day for which she takes Tylenol which seemed to work quite well. She who works as a Production designer, theatre/television/film at [employer] and states she will be unable to perform her job in her current situation I wish she did  significantly better ROS:   14 system review of systems is positive for blurred vision, feeling hot, feeling cold, memory loss, confusion, headache, slurred speech, snoring, restless sleep . She returns for followup after last visit on 04/15/13. She reports no significant change in her expressive language difficulties. She has no new complaints today. She still not working and has questions about when can she drives. She is here today for the TCD bubble study PMH:  Past Medical History  Diagnosis Date  . Hypertension   . Other and unspecified ovarian cysts   . Stroke     Social History:  History   Social History  . Marital Status: Single    Spouse Name: N/A    Number of Children: N/A  . Years of Education: N/A   Occupational History  . Not on file.   Social History Main Topics  . Smoking status: Never Smoker   . Smokeless tobacco: Not on file  . Alcohol Use: No  . Drug Use: No  . Sexually Active: Not on file   Other Topics Concern  . Not on file   Social History Narrative  . No narrative on file    Medications:   Current Outpatient Prescriptions on File Prior to Visit  Medication Sig Dispense Refill  . amLODipine (NORVASC) 10 MG tablet Take 1 tablet (10 mg total) by mouth every morning.  30 tablet  2  . Rivaroxaban (XARELTO) 20 MG TABS Take 1 tablet (20 mg total) by mouth daily.  30 tablet  6   No current facility-administered medications on file prior to visit.    Allergies:  No Known Allergies Filed  Vitals:   04/26/13 1555  BP: 121/80  Pulse: 80  Temp: 98 F (36.7 C)   Physical Exam General: well developed, well nourished, seated, in no evident distress Head: head normocephalic and atraumatic. Orohparynx benign Neck: supple with no carotid or supraclavicular bruits Cardiovascular: regular rate and rhythm, no murmurs Musculoskeletal: no deformity Skin:  no rash/petichiae Vascular:  Normal pulses all extremities  Neurologic Exam Mental Status: Awake and  fully alert. Oriented to place and time. Recent and remote memory intact. Attention span, concentration and fund of knowledge appropriate. Mood and affect appropriate. modertae expressive aphasia with word finding difficulties, paraphasic errors and disfluency. Unable to name, repeat her right. Good comprehension. Cranial Nerves: Fundoscopic exam reveals sharp disc margins. Pupils equal, briskly reactive to light. Extraocular movements full without nystagmus. Visual fields full to confrontation. Hearing intact. Facial sensation intact. Face, tongue, palate moves normally and symmetrically.  Motor: Normal bulk and tone. Normal strength in all tested extremity muscles. Sensory.: intact to tough and pinprick and vibratory.  Coordination: Rapid alternating movements normal in all extremities. Finger-to-nose and heel-to-shin performed accurately bilaterally. Gait and Station: Arises from chair without difficulty. Stance is normal. Gait demonstrates normal stride length and balance . Able to heel, toe and tandem walk without difficulty.  Reflexes: 1+ and symmetric. Toes downgoing.     ASSESSMENT:  66 year lady with subacute left parietal lobe infarct and February 2014 with significant residual expressive aphasia likely of embolic etiology without definite identified source of embolism. Vascular risk factors of hypertension, obesity and suspected sleep apnea . History of deep vein thrombosis on xarelto PLAN: Continue xarelto for deep vein thrombosis. Strict control of hypertension with blood pressure goal below 130/90.I advised her to diet, exercise and lose weight. Check polysomnogram for sleep apnea. Check transcranial Doppler bubble study with emboli monitoring.  The patient is clearly disabled from her significant aphasia and will be unable to work at the present time. She will return for followup in 3 months with Larita Fife, Np and we will reconsider her ability to work at that visit.       [facility]      [address]      [place]. [place] [address] [phone]       TRANSCRANIAL DOPPLER BUBBLE STUDY   [name] Date of Birth:  1966/12/01 Medical Record Number:  [id number]   Indications: Diagnostic Date of Procedure:  04/26/13 Clinical History:  16 year lady with cryptogenic stroke being evaluated for patent foramen ovale Technical Description:   Transcranial Doppler Bubble Study was performed at the bedside after taking written informed consent from the patient and explaining risk/benefits. Both middle cerebral arteries could not be insonated using a headset due to thick bones.hence study was done insonating right cavernous carotid via right orbital window. And IV line was inserted in the right wrist by the RN using aseptic precautions. Agitated saline injection at rest and after valsalva maneuver  Did not result in high intensity transient signals (HITS).   Impression:  Negative Transcranial Doppler Bubble Study  not indicative of right to left intracardiac shunt.   Results were explained to the patient. Questions were answered.

## 2013-04-26 NOTE — Patient Instructions (Addendum)
Continues arrival to 4 deep and thrombosis. Maintain strict control of hypertension with blood pressure goal below 130/90. Return for followup with [facility] nurse practitioner in 3 months. Patient may drive but not alone. Do not return to work until next followup visit

## 2013-04-29 ENCOUNTER — Telehealth: Payer: Self-pay | Admitting: *Deleted

## 2013-04-29 DIAGNOSIS — I6350 Cerebral infarction due to unspecified occlusion or stenosis of unspecified cerebral artery: Secondary | ICD-10-CM

## 2013-04-29 NOTE — Telephone Encounter (Signed)
Pt was given [employer] Application for [employer] event monitor, and was enrolled on 04/29/13 TK

## 2013-04-29 NOTE — Addendum Note (Signed)
Addended by: Guy Begin on: 04/29/2013 02:28 PM   Modules accepted: Orders

## 2013-05-06 ENCOUNTER — Telehealth: Payer: Self-pay | Admitting: *Deleted

## 2013-05-06 NOTE — Telephone Encounter (Signed)
Called pt, no answer. Vmail has not been set up as of yet.

## 2013-05-08 ENCOUNTER — Telehealth: Payer: Self-pay | Admitting: Neurology

## 2013-05-08 NOTE — Telephone Encounter (Signed)
IVIG auths are done through the nurses.  Will forward to [place], Dr Marlis Edelson nurse.

## 2013-05-09 ENCOUNTER — Other Ambulatory Visit: Payer: Self-pay | Admitting: Neurology

## 2013-05-09 DIAGNOSIS — R51 Headache: Secondary | ICD-10-CM

## 2013-05-09 NOTE — Telephone Encounter (Signed)
Pt called continues with daily headaches, had severe headache over the weekend.   She is taking tylenol po which helps at times.  She is also taking ST/OT and is asking if we are taking care of her re: her stroke.   I told her that we are.  Dr. Pearlean Brownie had seen her in hospital and here for bubble study.  Order for sleep test is the only thing I saw in notes.  Will see Heide Guile, NP in 3 mo for f/u.  I asked her to have her OT/ST send progress notes to [place], [facility]).  Do you want to give her something more for her headaches.  NK[facility].  [facility] in [place].  She asked about having her care transferred to [facility], but I think this had to do with no getting call back (phone number problem, which now is fixed).

## 2013-05-09 NOTE — Telephone Encounter (Signed)
I spoke to the patient and advised her to take 2 tablets of Tylenol every 8 hourly if needed for headaches. Check CT scan of the head without contrast in [place] and she is on xarelto for intracranial bleeding. She was advised to call back in one week. If the headache became severe or she had focal neurological symptoms she was advised to go to the emergency room.

## 2013-05-10 NOTE — Telephone Encounter (Signed)
Message was forwarded back to me.  I do not handle IVIG.  This is done by the nurses.  I will forward it to them.

## 2013-05-15 NOTE — Telephone Encounter (Signed)
Please advise 

## 2013-05-15 NOTE — Telephone Encounter (Signed)
I donot see any orders relating to IVIG on this pt.  She had a bubble study done and Dr. Pearlean Brownie gave her results when in.

## 2013-05-19 ENCOUNTER — Encounter: Payer: Self-pay | Admitting: Family Medicine

## 2013-05-21 ENCOUNTER — Telehealth: Payer: Self-pay | Admitting: Neurology

## 2013-05-21 NOTE — Telephone Encounter (Signed)
I called and spoke with the patient concerning her message. I informed the patient that I didn't see any other referral and I mentioned sleep and she stated she has already had a sleep study.

## 2013-05-27 ENCOUNTER — Telehealth: Payer: Self-pay | Admitting: *Deleted

## 2013-05-27 NOTE — Telephone Encounter (Signed)
Spoke to pt. C/o of headaches, pain scale 8, have been continuous since last OV. Explained Dr. Marlis Edelson previous note. Pt agreed. Says disability called her on Friday for our office address. She wanted to know why her disability forms have not been filled out as of yet but was not sure if they had been sent to our office. Advised pt if disability called her on Friday for our address, it is possible the forms have not been sent to our office as of yet. Not showing anything on file. Advsd pt to check with disability to get exact date forms were sent. Pt agreed.

## 2013-06-18 ENCOUNTER — Encounter: Payer: Self-pay | Admitting: Family Medicine

## 2013-07-12 ENCOUNTER — Other Ambulatory Visit: Payer: Self-pay | Admitting: Nurse Practitioner

## 2013-07-12 ENCOUNTER — Telehealth: Payer: Self-pay | Admitting: Neurology

## 2013-07-12 DIAGNOSIS — R480 Dyslexia and alexia: Secondary | ICD-10-CM | POA: Insufficient documentation

## 2013-07-12 DIAGNOSIS — R519 Headache, unspecified: Secondary | ICD-10-CM | POA: Insufficient documentation

## 2013-07-12 DIAGNOSIS — IMO0002 Reserved for concepts with insufficient information to code with codable children: Secondary | ICD-10-CM | POA: Insufficient documentation

## 2013-07-12 MED ORDER — NORTRIPTYLINE HCL 25 MG PO CAPS: 75.0000 mg | ORAL_CAPSULE | Freq: Every day | ORAL | Status: DC

## 2013-07-12 NOTE — Progress Notes (Signed)
Patient has frequent headaches for stroke, has been advised by Dr. Pearlean Brownie that she can take Tylenol Extra Strength every 6 hours.  Cannot take Ibuprofen due to Xarelto use and cannot have triptans due to stroke.  Will start on Nortriptyline 25mg  daily at bedtime, take 1 capsule week one and add 1 capsule on week 2 (50 mg) and add another capsule on week 3 and thereafter for a total of 3 capsules each night.  Hopefully this will help with headaches, sleep and depression associated with stroke. Advised patient to fill prescription at [facility], this is a $4 drug there, [facility] charges much more.  Refer to Speech/Language Pathologist for Acquired Dyslexia Rehab, patient cannot read since stroke.  She is already getting therapy for aphasia.  Medicaid was reportedly denied, advised patient to appeal.  Disability is pending.

## 2013-07-12 NOTE — Telephone Encounter (Signed)
Called patient and relayed NP Larita Fife has sent in a script for Nortriptyline 50 MG to [facility] in [place]. She also gave her a referral for speech therapy. Patient was very grateful and wants lynn to know thanks for all your help.

## 2013-07-16 ENCOUNTER — Institutional Professional Consult (permissible substitution): Payer: Self-pay | Admitting: Neurology

## 2013-07-19 ENCOUNTER — Encounter: Payer: Self-pay | Admitting: Family Medicine

## 2013-07-22 ENCOUNTER — Ambulatory Visit: Payer: Self-pay | Admitting: Neurology

## 2013-07-22 ENCOUNTER — Telehealth: Payer: Self-pay | Admitting: Neurology

## 2013-07-22 NOTE — Telephone Encounter (Signed)
Paper has been giving to sandy p, to call and schedule.

## 2013-08-07 ENCOUNTER — Ambulatory Visit: Payer: MEDICAID

## 2013-08-14 ENCOUNTER — Encounter: Payer: Self-pay | Admitting: Neurology

## 2013-08-19 ENCOUNTER — Encounter: Payer: Self-pay | Admitting: Family Medicine

## 2013-08-21 ENCOUNTER — Ambulatory Visit: Payer: Self-pay | Attending: Family Medicine | Admitting: Internal Medicine

## 2013-08-21 ENCOUNTER — Encounter: Payer: Self-pay | Admitting: Internal Medicine

## 2013-08-21 VITALS — BP 132/84 | HR 76 | Temp 97.9°F | Resp 16 | Ht 65.0 in | Wt 278.0 lb

## 2013-08-21 DIAGNOSIS — Z8673 Personal history of transient ischemic attack (TIA), and cerebral infarction without residual deficits: Secondary | ICD-10-CM

## 2013-08-21 DIAGNOSIS — R7309 Other abnormal glucose: Secondary | ICD-10-CM

## 2013-08-21 DIAGNOSIS — R51 Headache: Secondary | ICD-10-CM

## 2013-08-21 DIAGNOSIS — I10 Essential (primary) hypertension: Secondary | ICD-10-CM

## 2013-08-21 DIAGNOSIS — R7303 Prediabetes: Secondary | ICD-10-CM

## 2013-08-21 DIAGNOSIS — G43909 Migraine, unspecified, not intractable, without status migrainosus: Secondary | ICD-10-CM | POA: Insufficient documentation

## 2013-08-21 DIAGNOSIS — Z0000 Encounter for general adult medical examination without abnormal findings: Secondary | ICD-10-CM

## 2013-08-21 DIAGNOSIS — I82409 Acute embolism and thrombosis of unspecified deep veins of unspecified lower extremity: Secondary | ICD-10-CM

## 2013-08-21 DIAGNOSIS — E669 Obesity, unspecified: Secondary | ICD-10-CM

## 2013-08-21 MED ORDER — METOPROLOL TARTRATE 50 MG PO TABS: 50.0000 mg | ORAL_TABLET | Freq: Two times a day (BID) | ORAL | Status: DC

## 2013-08-21 NOTE — Progress Notes (Signed)
Patient ID: Mary [name], female   DOB: Dec 29, 1965, 47 y.o.   MRN: [id number]   CC: Migraine headaches  HPI: Mary [name] is a 47 year old female with past medical history of stroke and suffers with migraine headaches. She has taken extra strength Tylenol without relief. She is unable to take ibuprofen because she is on Xarelto and cannot have tryptans secondary to her history of stroke. She was seen in the clinic on 07/12/2013 and started on nortriptyline each bedtime for migraine prophylaxis. Patient tells me that she doesn't like the way this medication makes her feel and that she is "tired all the time ". She stopped the medication about 2 weeks ago. She says she gets her headaches about 2 times per week and they last about an hour at a time.  No Known Allergies Past Medical History  Diagnosis Date  . Hypertension   . Other and unspecified ovarian cysts   . Stroke    Current Outpatient Prescriptions on File Prior to Visit  Medication Sig Dispense Refill  . amLODipine (NORVASC) 10 MG tablet Take 1 tablet (10 mg total) by mouth every morning.  30 tablet  2  . Rivaroxaban (XARELTO) 20 MG TABS Take 1 tablet (20 mg total) by mouth daily.  30 tablet  6  . nortriptyline (PAMELOR) 25 MG capsule Take 3 capsules (75 mg total) by mouth at bedtime.  90 capsule  6   No current facility-administered medications on file prior to visit.   Family History  Problem Relation Age of Onset  . Diabetes Mellitus II Mother   . Stroke Other   . Transient ischemic attack Father    History   Social History  . Marital Status: Single    Spouse Name: N/A    Number of Children: N/A  . Years of Education: N/A   Occupational History  . Not on file.   Social History Main Topics  . Smoking status: Never Smoker   . Smokeless tobacco: Not on file  . Alcohol Use: No  . Drug Use: No  . Sexual Activity: Not on file   Other Topics Concern  . Not on file   Social History Narrative  . No narrative on  file    Review of Systems: Constitutional: No fever or chills;  Appetite normal; No weight loss.  HEENT: No blurry vision or diplopia, no pharyngitis or dysphagia, + residual dysarthria CV: No chest pain or arrhythmia.  Resp: No SOB, no cough. GI: No N/V, no diarrhea, no melena or hematochezia.  GU: No dysuria or hematuria.  MSK: no myalgias/arthralgias.  Neuro:  + headache or focal neurological deficits, + h/o word finding difficulty.  Psych: No depression or anxiety.  Endo: No thyroid disease or DM.  Skin: No rashes or lesions.  Heme: No anemia or blood dyscrasia   Objective:   Filed Vitals:   08/21/13 1117  BP: 132/84  Pulse: 76  Temp: 97.9 F (36.6 C)  Resp: 16    Physical Exam  Constitutional: Appears well-developed and well-nourished. No distress.  HENT: Normocephalic. External right and left ear normal. Oropharynx is clear and moist.  Eyes: Conjunctivae and EOM are normal. PERRLA, no scleral icterus.  Neck: Normal ROM. Neck supple. No JVD. No tracheal deviation. No thyromegaly.  CVS: RRR, S1/S2 +, no murmurs, no gallops, no carotid bruit.  Pulmonary: Effort and breath sounds normal, no stridor, rhonchi, wheezes, rales.  Abdominal: Soft. BS +,  no distension, tenderness, rebound or guarding. Musculoskeletal: Normal range  of motion. No edema and no tenderness.  Neuro: Alert. Normal reflexes, muscle tone coordination. No cranial nerve deficit. Skin: Skin is warm and dry. No rash noted. Not diaphoretic. No erythema. No pallor.  Psychiatric: Normal mood and affect. Behavior, judgment, thought content normal.   Lab Results  Component Value Date   WBC 5.3 01/29/2013   HGB 11.9* 01/29/2013   HCT 35.9* 01/29/2013   MCV 70.0* 01/29/2013   PLT 292 01/29/2013   Lab Results  Component Value Date   CREATININE 0.73 01/29/2013   BUN 8 01/29/2013   NA 140 01/29/2013   K 3.8 01/29/2013   CL 106 01/29/2013   CO2 24 01/29/2013    Lab Results  Component Value Date   HGBA1C 6.1* 01/24/2013    Lipid Panel     Component Value Date/Time   CHOL 128 01/24/2013 0555   TRIG 108 01/24/2013 0555   HDL 41 01/24/2013 0555   CHOLHDL 3.1 01/24/2013 0555   VLDL 22 01/24/2013 0555   LDLCALC 65 01/24/2013 0555       Assessment and plan:  1. Headache: Patient has been on Norvasc for blood pressure control. We'll substitute metoprolol 50 mg twice a day for dual effect of antihypertensive and migraine prophylaxis. She can continue to use Tylenol as needed for breakthrough pain. 2. History of stroke: Followup with Dr. Pearlean Brownie. 3. History of DVT: Continue Xarelto. 4. Prediabetes/Obesity: Patient was counseled on weight loss and encouraged to incorporate an exercise program into her daily routine. A low carbohydrate/sugar diet was discussed. Referred to dietitian for further outpatient management. 5. Hypertension: Patient is currently on Norvasc. We'll make a substitution of metoprolol 50 mg twice a day. 6. Routine health maintenance: See below.   Routine Health Maintenance   Ophthalmology Exam: Referral made.  Lipid Screening Q 5 years: 01/24/13  DM Screening >45 Q 3 years: 01/24/13, hemoglobin A1c 6.1%.  Mammogram annually in women > 40: Referral made.  Breast Exam annually: Declines.  PAP annually 21-30, Q 3 years > 30: Recommend in 3 years.  HTN Annually: Up to date.  Return to the clinic: 3 months.  Signed:  Dr. Trula Ore Sinaya [place] 08/21/2013 11:20 AM

## 2013-08-21 NOTE — Progress Notes (Signed)
Pt here with c/o migraine h/a's intermit frontal sharp pains x 2 mnth Pt has scheduled appt in October with neurology but medication prescribed not working Hx. CVA with speech deficit-

## 2013-08-21 NOTE — Patient Instructions (Signed)
Your hemoglobin A1c test (checks her blood sugars over 3 month period of time) is a little high and indicates that you have prediabetes and are at risk to develop diabetes. We encourage a diet low in carbohydrates and sugars for weight loss and daily exercise to help reduce your weight and your sugars.

## 2013-09-03 ENCOUNTER — Encounter: Payer: Self-pay | Attending: Internal Medicine | Admitting: *Deleted

## 2013-09-03 ENCOUNTER — Encounter: Payer: Self-pay | Admitting: *Deleted

## 2013-09-03 DIAGNOSIS — E669 Obesity, unspecified: Secondary | ICD-10-CM | POA: Insufficient documentation

## 2013-09-03 DIAGNOSIS — Z713 Dietary counseling and surveillance: Secondary | ICD-10-CM | POA: Insufficient documentation

## 2013-09-03 NOTE — Patient Instructions (Addendum)
[facility] [phone] [facility] [phone]  Aim for 3 meals each day: Small breakfast- 1/2 bagel or 2 slices of toast Small lunch- 1/2 bagel or piece of fruit or yogurt or whatever Dinner as reported  Drink 2 less sodas each day by switching to diet or drinking more water with fruit added  Try to get out for a walk 3 days a week for 20 minutes

## 2013-09-03 NOTE — Progress Notes (Signed)
  Medical Nutrition Therapy:  Appt start time: 1030 end time:  1130.  Assessment:  Primary concerns today: Mary [name] is here for nutrition counseling pertaining to obesity.  She states that she has always been heavy.  She is at her heaviest weight.  Her lowest weight is about 225 lb.  Her most consistent weight is about 250 pounds.  No one else in her family is heavy.  There is a strong family history of diabetes and HTN.  Mary [name] has HTN herself, but no other medical problems.  She has attempted to lose weight in the past via HCGC pills and exercise. She would like to lose about 60 pounds.  She had a stroke in February and she no longer works. Lives with mom and they share shopping, cooking responsibility.  Her mother grazes throughout the day.Mary [name] eats in the kitchen and watches tv sometimes.  She believes she is a fast eater.  She has difficulty sleeping. She goes to bed around 2 am and wakes up around 8-9am.  She has a history of ovarian cycts and skin tags  MEDICATIONS: see list   DIETARY INTAKE:  Usual eating pattern includes 1 meals and 0-2 snacks per day.  Everyday foods include proteins, starches.  Avoided foods include none.    24-hr recall:  B ( AM): bagel with butter and coffee with sugar, cream (10 am).  Skips often and would go until 4 pm to eat  Snk ( AM): none  L ( PM): not usually Snk ( PM): none D ( PM): (around 4 pm) chicken and vegetables or pasta Snk ( PM): around 8 pm- leftovers from dinner Another snack- half sandwich Beverages: soda, juice, no water, coffee, sometimes koolaid  Usual physical activity: sometimes walks around park  Estimated energy needs: 1800 calories 200 g carbohydrates 135 g protein 50 g fat    Nutritional Diagnosis:  [place]-3.3 Overweight/obesity As related to genetic predisposition towards larger size combined with erratic meal pattern and limited physical activity.  As evidenced by BMI>30.    Intervention:  Nutrition counseling provided.   Discussed metabolic effects of meal skipping and poor sleep hygiene.  Recommended speaking either with therapist or medical provider about ways to improve sleep.  Suggested [facility] or [facility].  Goals: Aim for 3 meals each day: Small breakfast- 1/2 bagel or 2 slices of toast Small lunch- 1/2 bagel or piece of fruit or yogurt or whatever Dinner as reported  Drink 2 less sodas each day by switching to diet or drinking more water with fruit added  Try to get out for a walk 3 days a week for 20 minutes   Monitoring/Evaluation:  Dietary intake, exercise, mental health, and body weight in 2 month(s).

## 2013-09-12 ENCOUNTER — Ambulatory Visit: Payer: Self-pay | Attending: Internal Medicine

## 2013-09-18 ENCOUNTER — Encounter: Payer: Self-pay | Admitting: Family Medicine

## 2013-10-09 ENCOUNTER — Ambulatory Visit: Payer: Self-pay | Admitting: Neurology

## 2013-10-11 ENCOUNTER — Ambulatory Visit: Payer: Self-pay | Admitting: Nurse Practitioner

## 2013-10-14 ENCOUNTER — Ambulatory Visit (INDEPENDENT_AMBULATORY_CARE_PROVIDER_SITE_OTHER): Payer: Self-pay | Admitting: Nurse Practitioner

## 2013-10-14 ENCOUNTER — Encounter: Payer: Self-pay | Admitting: Nurse Practitioner

## 2013-10-14 VITALS — BP 162/98 | HR 69 | Temp 97.8°F | Ht <= 58 in | Wt 191.0 lb

## 2013-10-14 DIAGNOSIS — R480 Dyslexia and alexia: Secondary | ICD-10-CM

## 2013-10-14 DIAGNOSIS — F329 Major depressive disorder, single episode, unspecified: Secondary | ICD-10-CM

## 2013-10-14 DIAGNOSIS — I69920 Aphasia following unspecified cerebrovascular disease: Secondary | ICD-10-CM

## 2013-10-14 DIAGNOSIS — I4891 Unspecified atrial fibrillation: Secondary | ICD-10-CM

## 2013-10-14 DIAGNOSIS — F3289 Other specified depressive episodes: Secondary | ICD-10-CM

## 2013-10-14 DIAGNOSIS — I6350 Cerebral infarction due to unspecified occlusion or stenosis of unspecified cerebral artery: Secondary | ICD-10-CM

## 2013-10-14 DIAGNOSIS — IMO0002 Reserved for concepts with insufficient information to code with codable children: Secondary | ICD-10-CM

## 2013-10-14 NOTE — Patient Instructions (Addendum)
Start Aspirin 81 mg daily for secondary stroke prevention. Strict control of hypertension with blood pressure goal below 130/90. I advised her to diet, exercise and lose weight.  Recommended B12 supplement for energy.  She will return for followup in 6 months with Larita Fife, Np.

## 2013-10-14 NOTE — Progress Notes (Signed)
[facility]  PATIENT: Mary Mary [name] DOB: 09/24/1966   REASON FOR VISIT: follow up HISTORY FROM: patient  HISTORY OF PRESENT ILLNESS: Mary Mary [name] is a 47 year obese young lady seen for first office followup visit following hospital admission for stroke on 01/23/13. She presented with several days history of slurred speech and expressive language difficulties. Her symptoms actually began after an ovarian biopsy and she initially came to the ER and was discharged home but symptoms persisted and a few days later she returned and CT scan of the head this time showed a subacute left parietal MCA branch infarct. CT angiogram of the brain showed occluded distal branch of left middle cerebral artery corresponding to the infarct territory. CT angiogram of the neck showed no significant extracranial stenosis. MRI of the brain could not be obtained due to patient's obesity as well as severe claustrophobia. Transthoracic echo showed normal ejection fraction. Transesophageal echocardiogram showed no patent foramen ovale, vegetation or clot. EKG and telemetry monitoring did not reveal and cardiac arrhythmias. ESR,complements. ANA panel, RPR, HIV, antithrombin III and homocystine were all normal. The patient had significant expressive aphasia and was seen by physical, occupational and speech therapy and who was found to have deep vein thrombosis and hence was started on anticoagulation with xarelto. She was discharged and has continued to have outpatient speech therapy and has obtained some modest improvement in her speech. She still has significant word hesitancy but is able to communicate. She complains of mild headaches every other day for which she takes Tylenol which seemed to work quite well. She who works as a Production designer, theatre/television/film at [employer] and states she will be unable to perform her job in her current situation.  She returns for followup after last visit on 04/15/13. She reports no significant change  in her expressive language difficulties. She has no new complaints today. She still not working and has questions about when can she drive. TCD bubble study was negative.   UPDATE 10/14/13 (LL):  Mary Mary [name] comes back for stroke revisit.  She is doing well, PCP changed her from Norvasc to Metoprolol because she was having frequent headaches, which has helped.  She stopped Nortriptyline due to lethargy and it was not helping her headaches.  She may need to restart Norvasc though, because BP is elevated today, 162/98.  She is still in speech therapy which has helped her global aphasia.  She was able to get disability but is still having trouble paying bills, would like to work part-time but unsure if she will be able to find something because of her cognitive disabilities and speech.  Her mood is better today.  REVIEW OF SYSTEMS: Mary [name] 14 system review of systems performed and notable only for:  Constitutional: weight gain, fatigue Cardiovascular: N/A  Ear/Nose/Throat: N/A  Skin: N/A  Eyes: double vision  Respiratory: snoring Gastroitestinal: blood in stool Genitourinary: N/A Hematology/Lymphatic: N/A  Endocrine: N/A Musculoskeletal:N/A  Allergy/Immunology: N/A  Neurological: confusion, headache Psychiatric: depression Sleep: snoring   ALLERGIES: No Known Allergies  HOME MEDICATIONS: Outpatient Prescriptions Prior to Visit  Medication Sig Dispense Refill  . metoprolol (LOPRESSOR) 50 MG tablet Take 1 tablet (50 mg total) by mouth 2 (two) times daily.  60 tablet  3  . Rivaroxaban (XARELTO) 20 MG TABS Take 1 tablet (20 mg total) by mouth daily.  30 tablet  6   No facility-administered medications prior to visit.    PAST MEDICAL HISTORY: Past Medical History  Diagnosis Date  . Hypertension   .  Other and unspecified ovarian cysts   . Stroke     PAST SURGICAL HISTORY: Past Surgical History  Procedure Laterality Date  . Ovarian cyst removal    . Tee without cardioversion N/A  01/25/2013    Procedure: TRANSESOPHAGEAL ECHOCARDIOGRAM (TEE);  Surgeon: Ricki Rodriguez, MD;  Location: [facility] ENDOSCOPY;  Service: Cardiovascular;  Laterality: N/A;    FAMILY HISTORY: Family History  Problem Relation Age of Onset  . Diabetes Mellitus II Mother   . Diabetes Mother   . Stroke Other   . Diabetes Other   . Transient ischemic attack Father   . Diabetes Sister     SOCIAL HISTORY: History   Social History  . Marital Status: Single    Spouse Name: N/A    Number of Children: N/A  . Years of Education: N/A   Occupational History  . Not on file.   Social History Main Topics  . Smoking status: Never Smoker   . Smokeless tobacco: Not on file  . Alcohol Use: No  . Drug Use: No  . Sexual Activity: Yes   Other Topics Concern  . Not on file   Social History Narrative  . No narrative on file     PHYSICAL EXAM  Filed Vitals:   10/14/13 1035  BP: 162/98  Pulse: 69  Temp: 97.8 F (36.6 C)  TempSrc: Oral  Height: 5.5" (0.14 m)  Weight: 191 lb (86.637 kg)    General: well developed, obese, seated, in no evident distress  Head: head normocephalic and atraumatic. Orohparynx benign  Neck: supple with no carotid or supraclavicular bruits  Cardiovascular: regular rate and rhythm, no murmurs  Musculoskeletal: no deformity  Skin: no rash/petichiae  Vascular: Normal pulses all extremities   Neurologic Exam  Mental Status: Awake and fully alert. Oriented to place and time. Recent and remote memory intact. Attention span, concentration and fund of knowledge appropriate. Mood and affect appropriate. modertae expressive aphasia with word finding difficulties, paraphasic errors and disfluency. Fair comprehension.  Cranial Nerves:  Pupils equal, briskly reactive to light. Extraocular movements Mary [name] without nystagmus. Visual fields Mary [name] to confrontation. Hearing intact. Facial sensation intact. Face, tongue, palate moves normally and symmetrically.  Motor: Normal bulk and  tone. Normal strength in all tested extremity muscles.  Sensory.: intact to tough and pinprick and vibratory.  Coordination: Rapid alternating movements normal in all extremities. Finger-to-nose and heel-to-shin performed accurately bilaterally.  Gait and Station: Arises from chair without difficulty. Stance is normal. Gait demonstrates normal stride length and balance . Able to heel, toe and tandem walk without difficulty.  Reflexes: 1+ and symmetric. Toes downgoing.   DIAGNOSTIC DATA (LABS, IMAGING, TESTING) - I reviewed patient records, labs, notes, testing and imaging myself where available.  Lab Results  Component Value Date   WBC 5.3 01/29/2013   HGB 11.9* 01/29/2013   HCT 35.9* 01/29/2013   MCV 70.0* 01/29/2013   PLT 292 01/29/2013      Component Value Date/Time   NA 140 01/29/2013 0627   K 3.8 01/29/2013 0627   CL 106 01/29/2013 0627   CO2 24 01/29/2013 0627   GLUCOSE 99 01/29/2013 0627   BUN 8 01/29/2013 0627   CREATININE 0.73 01/29/2013 0627   CALCIUM 8.8 01/29/2013 0627   PROT 7.5 01/27/2013 0948   ALBUMIN 3.2* 01/27/2013 0948   AST 19 01/27/2013 0948   ALT 21 01/27/2013 0948   ALKPHOS 58 01/27/2013 0948   BILITOT 0.2* 01/27/2013 0948   GFRNONAA >90 01/29/2013 5621  GFRAA >90 01/29/2013 0627   Lab Results  Component Value Date   CHOL 128 01/24/2013   HDL 41 01/24/2013   LDLCALC 65 01/24/2013   TRIG 108 01/24/2013   CHOLHDL 3.1 01/24/2013   Lab Results  Component Value Date   HGBA1C 6.1* 01/24/2013   No results found for this basename: VITAMINB12   Lab Results  Component Value Date   TSH 1.003 01/24/2013    ASSESSMENT AND PLAN 47 year old lady with subacute left parietal lobe infarct and February 2014 with significant residual global aphasia likely of embolic etiology without definite identified source of embolism.  Vascular risk factors of hypertension, obesity and suspected sleep apnea. History of deep vein thrombosis, completed xarelto.  PLAN:  Start aspirin 81mg  daily for secondary  stroke prevention. Strict control of hypertension with blood pressure goal below 130/90.  I advised her to diet, exercise and lose weight.  Provided work note to see if [employer] will let her work 4 hour shift. Continue Speech therapy. I have asked her to call PCP about restarting Norvasc for elevated BP. Metoprolol helping headaches. Follow up with Mary Mary [name] in 6 months.  Ronal Fear, MSN, NP-C 10/14/2013, 11:17 AM [facility] [address], [address] [place], [place] [address] [phone]

## 2013-10-16 ENCOUNTER — Ambulatory Visit: Payer: Self-pay | Attending: Internal Medicine | Admitting: Pharmacist

## 2013-10-16 VITALS — BP 161/103 | HR 78 | Temp 97.1°F | Resp 16 | Ht 65.0 in | Wt 291.0 lb

## 2013-10-16 DIAGNOSIS — I10 Essential (primary) hypertension: Secondary | ICD-10-CM | POA: Insufficient documentation

## 2013-10-16 MED ORDER — CLONIDINE HCL 0.1 MG PO TABS: 0.1000 mg | ORAL_TABLET | Freq: Once | ORAL | Status: DC

## 2013-10-16 NOTE — Addendum Note (Signed)
Addended by: Nonnie Done D on: 10/16/2013 03:17 PM   Modules accepted: Orders

## 2013-10-16 NOTE — Patient Instructions (Signed)
Pt instructed to continue taking prescribed Metoprolol 50 mg BiD Clonidine 0.1 mg x 1 given with positive BP 147/87

## 2013-10-16 NOTE — Progress Notes (Signed)
Pt here for blood pressure recheck Taking prescribed Metoprolol 50 mg bid States she took 10 mg Amlodipine today extra for h/a BP 161/103

## 2013-10-17 ENCOUNTER — Emergency Department: Payer: Self-pay | Admitting: Emergency Medicine

## 2013-10-17 LAB — COMPREHENSIVE METABOLIC PANEL
Alkaline Phosphatase: 92 U/L (ref 50–136)
Anion Gap: 4 — ABNORMAL LOW (ref 7–16)
BUN: 9 mg/dL (ref 7–18)
Calcium, Total: 8.3 mg/dL — ABNORMAL LOW (ref 8.5–10.1)
Chloride: 105 mmol/L (ref 98–107)
Co2: 28 mmol/L (ref 21–32)
EGFR (Non-African Amer.): >60
Glucose: 92 mg/dL (ref 65–99)
Osmolality: 272 (ref 275–301)
Potassium: 3.8 mmol/L (ref 3.5–5.1)
SGOT(AST): 30 U/L (ref 15–37)
Sodium: 137 mmol/L (ref 136–145)
Total Protein: 8.4 g/dL — ABNORMAL HIGH (ref 6.4–8.2)

## 2013-10-17 LAB — URINALYSIS, COMPLETE
Bilirubin,UR: NEGATIVE
Blood: NEGATIVE
Glucose,UR: NEGATIVE mg/dL (ref 0–75)
Ketone: NEGATIVE
Nitrite: NEGATIVE
Protein: NEGATIVE
Squamous Epithelial: 10

## 2013-10-17 LAB — CBC
HGB: 12.1 g/dL (ref 12.0–16.0)
MCHC: 33.6 g/dL (ref 32.0–36.0)
Platelet: 245 10*3/uL (ref 150–440)
RDW: 19.5 % — ABNORMAL HIGH (ref 11.5–14.5)
WBC: 4.5 10*3/uL (ref 3.6–11.0)

## 2013-10-17 IMAGING — CT CT HEAD WITHOUT CONTRAST
1 series · 15 of 30 positions shown, 19 images · non-contrast
Comparison: none

REASON FOR EXAM: dizziness, h/o CVA, prior [HOSPITAL] [HOSPITAL]
COMMENTS:

[Series 2: head wo · axial · 0.43mm/px · z∈[-44,+100]mm · 15 of 33 slices shown, 19 images]
[im 2/33  brain]
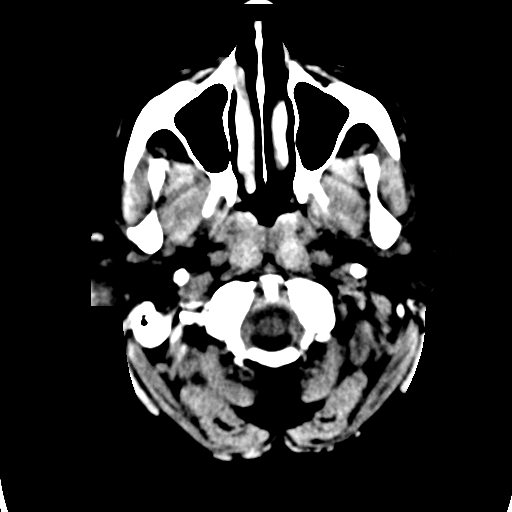
[im 2/33  bone]
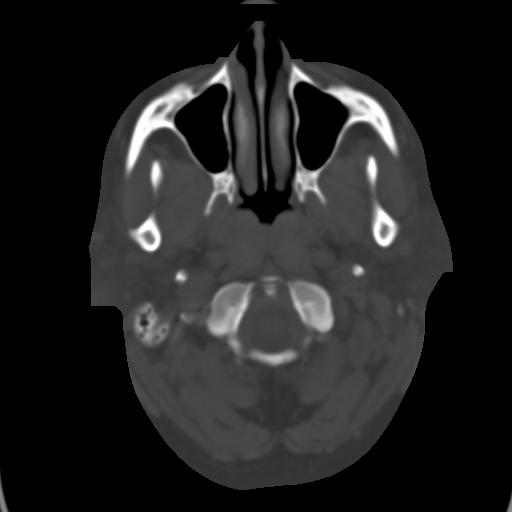
[im 4/33  brain]
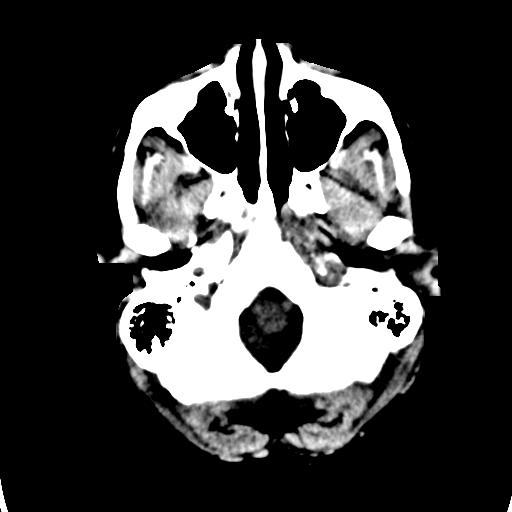
[im 6/33  brain]
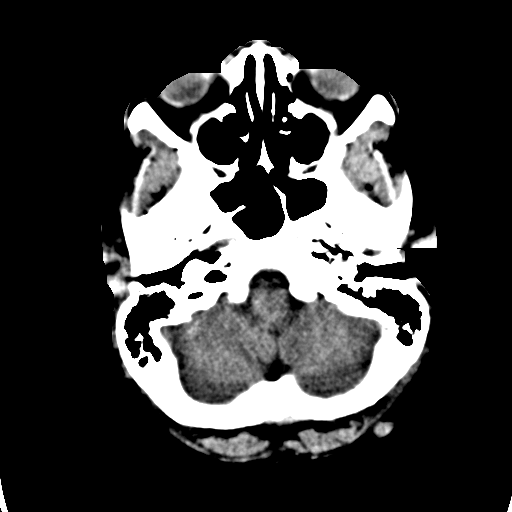
[im 8/33  brain]
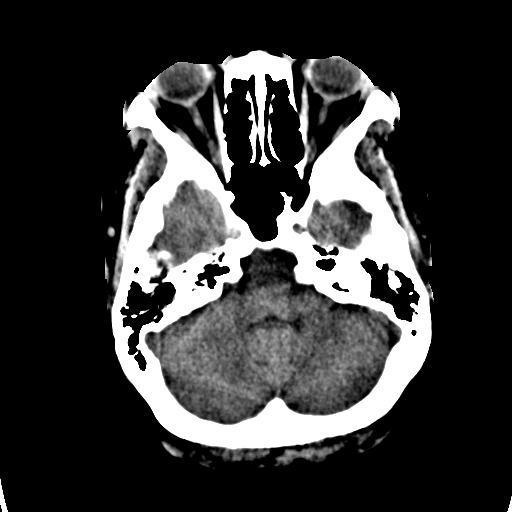
[im 10/33  brain]
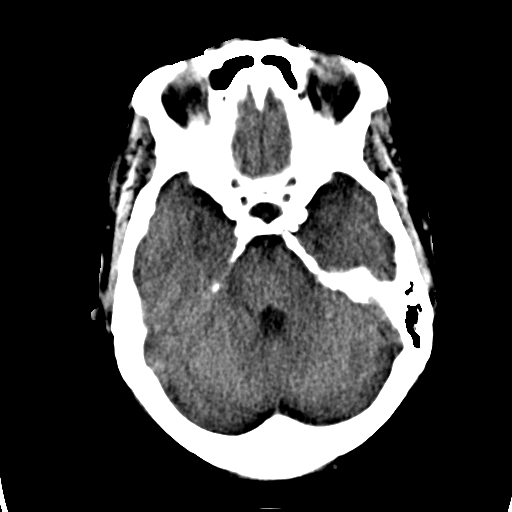
[im 10/33  bone]
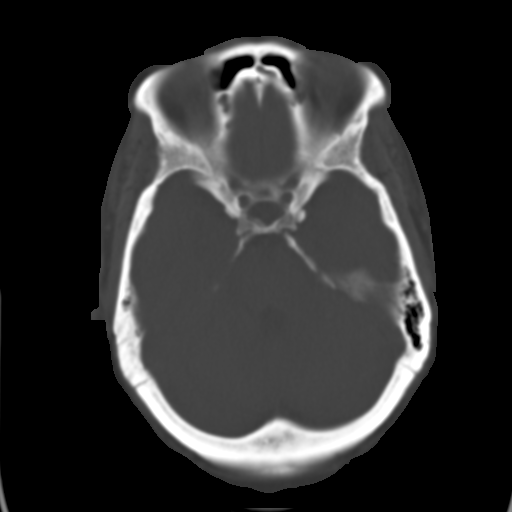
[im 13/33  brain]
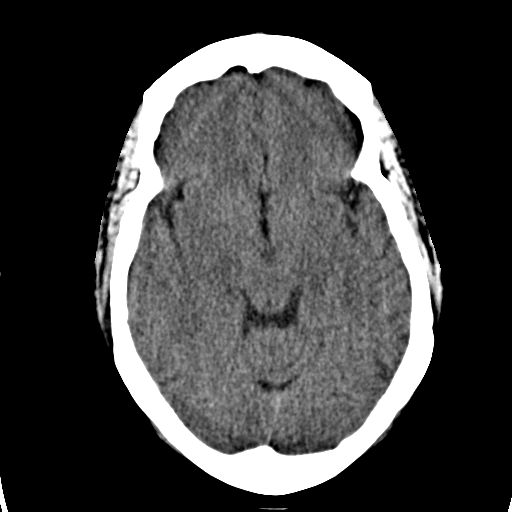
[im 15/33  brain]
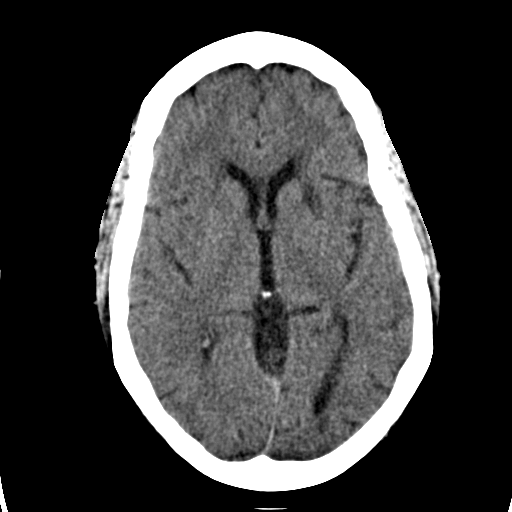
[im 17/33  brain]
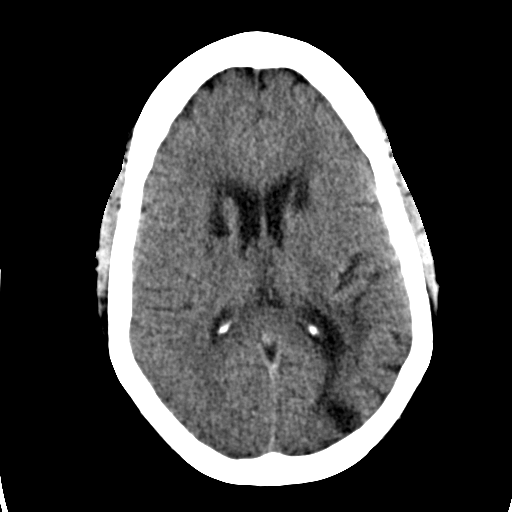
[im 18/33  brain]
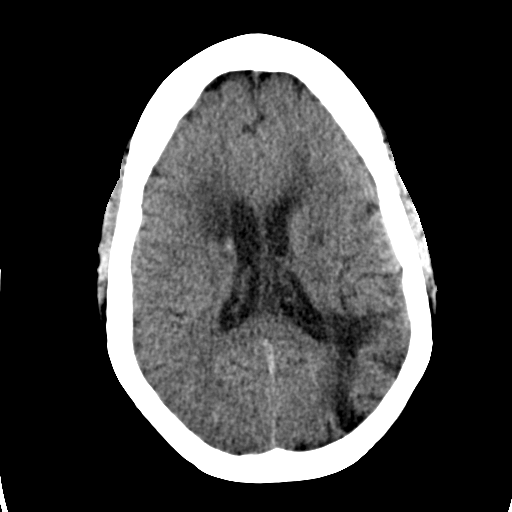
[im 18/33  bone]
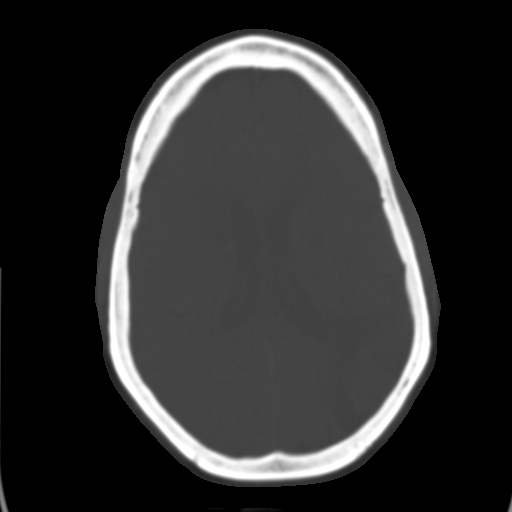
[im 20/33  brain]
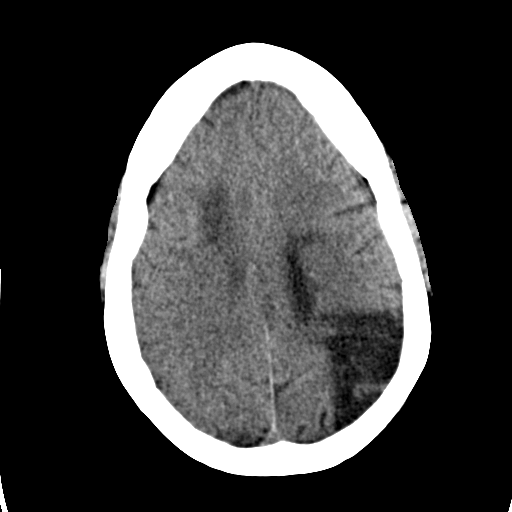
[im 23/33  brain]
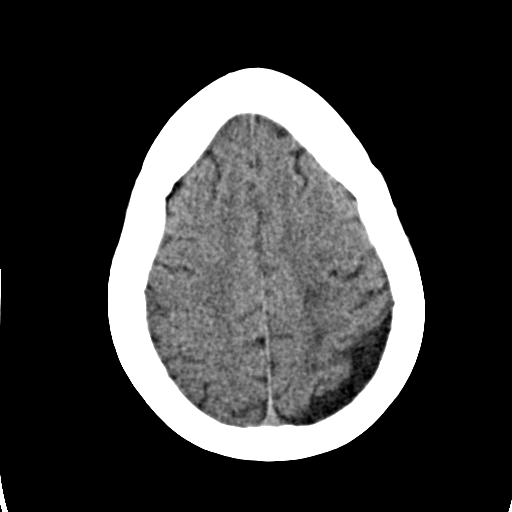
[im 25/33  brain]
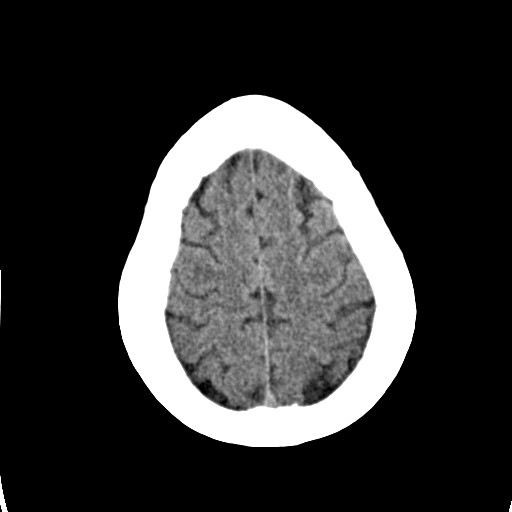
[im 27/33  brain]
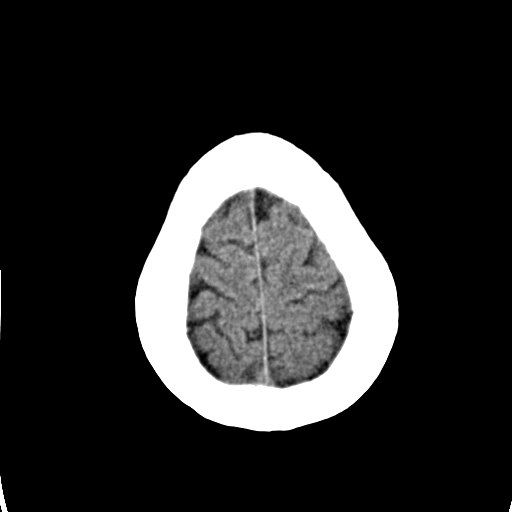
[im 27/33  bone]
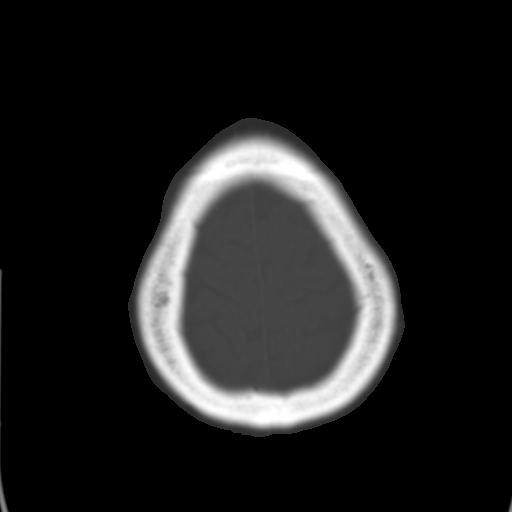
[im 29/33  brain]
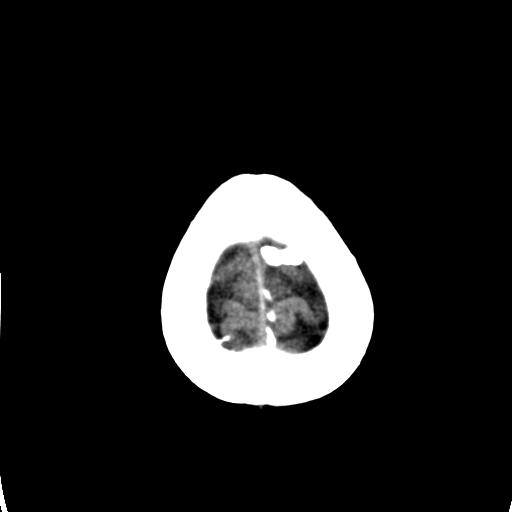
[im 31/33  brain]
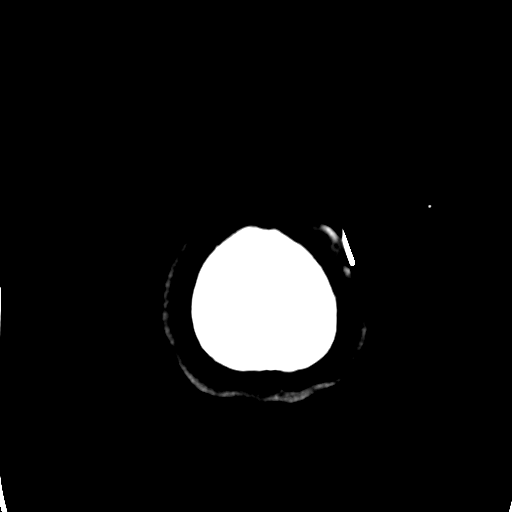

[15 of 30 positions shown; findings below may reference images not displayed]

PROCEDURE:     CT  - CT HEAD WITHOUT CONTRAST  - [DATE]  [DATE]

RESULT:     Noncontrast CT of the brain is performed. The patient has no
previous similar study for comparison.

There is a well-defined old-appearing left parieto-occipital infarct along
with bilateral periventricular infarcts. There is a punctate density the
lateral aspect of the right lateral ventricle which could represent
calcification or minimal hemorrhage on image 18. Given the lack of previous
studies it is difficult to determine if this could be minimal hemorrhage or
minimal cloudlike calcification. Correlate with reports from previous
studies. The posterior fossa is unremarkable. The cerebral hemispheres
otherwise appear to be unremarkable. There are no other areas suspicious for
intracranial hemorrhage. There is no mass or mass effect evident. The
sinuses and mastoid air cells show normal appearing aeration.
IMPRESSION: 1. Old-appearing infarcts bilaterally as described. Nonspecific punctate
area of ill-defined increased density along the right lateral ventricle on
image 18 adjacent to an old periventricular infarct. This has an appearance
which may represent calcification. Unfortunately, given the appearance,
possibility of minimal hemorrhage is not excluded. There no previous images
available for comparison.

[REDACTED]

## 2013-10-19 ENCOUNTER — Encounter: Payer: Self-pay | Admitting: Family Medicine

## 2013-10-25 ENCOUNTER — Telehealth: Payer: Self-pay | Admitting: Neurology

## 2013-10-25 NOTE — Telephone Encounter (Signed)
Spoke to patient and said she wasn't feeling right and went to the ER at [facility].  She said they did an MRI and told her what they saw, (she couldn't remember) and then told her to come and see her neurologist.  She was not admitted, just there for a few hours.  I told her once we get records and results from [place] the doctor will review and then we will contact her with findings.

## 2013-10-30 NOTE — Telephone Encounter (Signed)
ER notes state she had generalized weakness that resolved by the time the ER MD saw her.  CT head showed old infarcts, nothing acute.  Please call and check up on her.

## 2013-11-04 ENCOUNTER — Ambulatory Visit: Payer: Self-pay | Admitting: *Deleted

## 2013-11-04 ENCOUNTER — Telehealth: Payer: Self-pay | Admitting: *Deleted

## 2013-11-06 NOTE — Telephone Encounter (Signed)
Spoke to patient, she stated feeling tired, started working 15 hours a week.  She will call if any new symptoms occur.  Relayed Lynns message.

## 2013-11-06 NOTE — Telephone Encounter (Signed)
Spoke to patient, she expressed being tired but has started working 15 hours a week at [employer].  She will call if any other problems occur.

## 2013-11-18 ENCOUNTER — Encounter: Payer: Self-pay | Admitting: Family Medicine

## 2013-11-20 ENCOUNTER — Ambulatory Visit: Payer: Self-pay

## 2013-11-28 NOTE — Progress Notes (Signed)
I reviewed note and agree with plan.   Doylene Splinter R. Shahira Fiske, MD  Certified in Neurology, Neurophysiology and Neuroimaging  [facility] [address], [address] [place], [place] [address] [phone]   

## 2013-12-19 ENCOUNTER — Encounter: Payer: Self-pay | Admitting: Family Medicine

## 2014-01-19 ENCOUNTER — Encounter: Payer: Self-pay | Admitting: Family Medicine

## 2014-01-21 ENCOUNTER — Telehealth: Payer: Self-pay | Admitting: *Deleted

## 2014-01-21 ENCOUNTER — Other Ambulatory Visit: Payer: Self-pay | Admitting: Internal Medicine

## 2014-01-21 MED ORDER — METOPROLOL TARTRATE 50 MG PO TABS: 50.0000 mg | ORAL_TABLET | Freq: Two times a day (BID) | ORAL | Status: DC

## 2014-01-21 NOTE — Telephone Encounter (Signed)
Contacted pt for a medication refill. Pt is a new patient and has an appointment on 01/30/14. Informed pt that she has to attend the appointment in order to receive the refill. Only prescribed a 30 day supply for pt until her appointment at our clinic. Pt will come in today to pick up medication, Call completed effectively.

## 2014-01-30 ENCOUNTER — Encounter: Payer: Self-pay | Admitting: Internal Medicine

## 2014-01-30 ENCOUNTER — Ambulatory Visit: Payer: BC Managed Care – PPO | Attending: Internal Medicine | Admitting: Internal Medicine

## 2014-01-30 VITALS — BP 170/100 | HR 67 | Temp 98.2°F | Resp 16 | Wt 294.0 lb

## 2014-01-30 DIAGNOSIS — IMO0002 Reserved for concepts with insufficient information to code with codable children: Secondary | ICD-10-CM

## 2014-01-30 DIAGNOSIS — I6350 Cerebral infarction due to unspecified occlusion or stenosis of unspecified cerebral artery: Secondary | ICD-10-CM

## 2014-01-30 DIAGNOSIS — Z139 Encounter for screening, unspecified: Secondary | ICD-10-CM

## 2014-01-30 DIAGNOSIS — R4701 Aphasia: Secondary | ICD-10-CM

## 2014-01-30 DIAGNOSIS — I10 Essential (primary) hypertension: Secondary | ICD-10-CM

## 2014-01-30 MED ORDER — AMLODIPINE BESYLATE 5 MG PO TABS: 5.0000 mg | ORAL_TABLET | Freq: Every day | ORAL | Status: DC

## 2014-01-30 MED ORDER — METOPROLOL TARTRATE 50 MG PO TABS: 50.0000 mg | ORAL_TABLET | Freq: Two times a day (BID) | ORAL | Status: DC

## 2014-01-30 MED ORDER — CLONIDINE HCL 0.1 MG PO TABS
0.2000 mg | ORAL_TABLET | Freq: Once | ORAL | Status: AC
  Administered 2014-01-30: 0.2 mg via ORAL

## 2014-01-30 NOTE — Progress Notes (Signed)
Patient here for follow up HTN and stroke Presents with elevated blood pressure in office today Per protocol gave 0.2mg  catapress

## 2014-01-30 NOTE — Progress Notes (Signed)
MRN: [id number] Name: Mary [name]  Sex: female Age: 48 y.o. DOB: Apr 21, 1966  Allergies: Review of patient's allergies indicates no known allergies.  Chief Complaint  Patient presents with  . Follow-up    HPI: Patient is 48 y.o. female who has to of hypertension history of CVA with aphasia/dyslexia due to stroke currently patient following up in neurology, patient's blood pressure today is very high denies any headache dizziness chest shortness of breath as per patient she has not taken her metoprolol today but previously she had been compliant with taking her medication, she has not had any blood work done recently she reported to have gained weight.  Past Medical History  Diagnosis Date  . Hypertension   . Other and unspecified ovarian cysts   . Stroke     Past Surgical History  Procedure Laterality Date  . Ovarian cyst removal    . Tee without cardioversion N/A 01/25/2013    Procedure: TRANSESOPHAGEAL ECHOCARDIOGRAM (TEE);  Surgeon: Birdie Riddle, MD;  Location: [facility] ENDOSCOPY;  Service: Cardiovascular;  Laterality: N/A;      Medication List       This list is accurate as of: 01/30/14  9:45 AM.  Always use your most recent med list.               amLODipine 5 MG tablet  Commonly known as:  NORVASC  Take 1 tablet (5 mg total) by mouth daily.     metoprolol 50 MG tablet  Commonly known as:  LOPRESSOR  Take 1 tablet (50 mg total) by mouth 2 (two) times daily.     Rivaroxaban 20 MG Tabs tablet  Commonly known as:  XARELTO  Take 1 tablet (20 mg total) by mouth daily.        Meds ordered this encounter  Medications  . cloNIDine (CATAPRES) tablet 0.2 mg    Sig:   . amLODipine (NORVASC) 5 MG tablet    Sig: Take 1 tablet (5 mg total) by mouth daily.    Dispense:  90 tablet    Refill:  3  . metoprolol (LOPRESSOR) 50 MG tablet    Sig: Take 1 tablet (50 mg total) by mouth 2 (two) times daily.    Dispense:  30 tablet    Refill:  3     There is no  immunization history on file for this patient.  Family History  Problem Relation Age of Onset  . Diabetes Mellitus II Mother   . Diabetes Mother   . Stroke Other   . Diabetes Other   . Transient ischemic attack Father   . Diabetes Sister     History  Substance Use Topics  . Smoking status: Never Smoker   . Smokeless tobacco: Not on file  . Alcohol Use: No    Review of Systems   As noted in HPI  Filed Vitals:   01/30/14 0939  BP: 170/100  Pulse:   Temp:   Resp:     Physical Exam  Physical Exam  Eyes: EOM are normal. Pupils are equal, round, and reactive to light.  Cardiovascular: Normal rate and regular rhythm.   Pulmonary/Chest: Breath sounds normal. No respiratory distress. She has no wheezes. She has no rales.  Musculoskeletal: She exhibits no edema.  Neurological:  Strength equal in all extremities     CBC    Component Value Date/Time   WBC 5.3 01/29/2013 0627   RBC 5.13* 01/29/2013 0627   HGB 11.9* 01/29/2013 1937  HCT 35.9* 01/29/2013 0627   PLT 292 01/29/2013 0627   MCV 70.0* 01/29/2013 0627   LYMPHSABS 1.5 01/24/2013 0555   MONOABS 0.4 01/24/2013 0555   EOSABS 0.5 01/24/2013 0555   BASOSABS 0.0 01/24/2013 0555    CMP     Component Value Date/Time   NA 140 01/29/2013 0627   K 3.8 01/29/2013 0627   CL 106 01/29/2013 0627   CO2 24 01/29/2013 0627   GLUCOSE 99 01/29/2013 0627   BUN 8 01/29/2013 0627   CREATININE 0.73 01/29/2013 0627   CALCIUM 8.8 01/29/2013 0627   PROT 7.5 01/27/2013 0948   ALBUMIN 3.2* 01/27/2013 0948   AST 19 01/27/2013 0948   ALT 21 01/27/2013 0948   ALKPHOS 58 01/27/2013 0948   BILITOT 0.2* 01/27/2013 0948   GFRNONAA >90 01/29/2013 0627   GFRAA >90 01/29/2013 0627    Lab Results  Component Value Date/Time   CHOL 128 01/24/2013  5:55 AM    No components found with this basename: hga1c    Lab Results  Component Value Date/Time   AST 19 01/27/2013  9:48 AM    Assessment and Plan  HTN (hypertension) uncontrolled her - Plan: cloNIDine  (CATAPRES) tablet 0.2 mg given in the office her repeat blood pressure is 170/100 I have advised patient follow salt diet I have added , amLODipine (NORVASC) 5 MG tablet daily, continue with her metoprolol (LOPRESSOR) 50 MG tablet twice a day Patient will come back for BP check in 2 weeks  Aphasia due to stroke/following up with her neurologist.  Screening - Plan: Baseline blood work  CBC with Differential, COMPLETE METABOLIC PANEL WITH GFR, MM DIGITAL SCREENING BILATERAL, Vit D  25 hydroxy (rtn osteoporosis monitoring), Lipid panel, TSH   Health Maintenance -Mammogram: ordered   Return in about 6 weeks (around 03/13/2014) for BP check in 2 weeks .  Lorayne Marek, MD

## 2014-01-30 NOTE — Patient Instructions (Signed)
2 Gram Low Sodium Diet A 2 gram sodium diet restricts the amount of sodium in the diet to no more than 2 g or 2000 mg daily. Limiting the amount of sodium is often used to help lower blood pressure. It is important if you have heart, liver, or kidney problems. Many foods contain sodium for flavor and sometimes as a preservative. When the amount of sodium in a diet needs to be low, it is important to know what to look for when choosing foods and drinks. The following includes some information and guidelines to help make it easier for you to adapt to a low sodium diet. QUICK TIPS  Do not add salt to food.  Avoid convenience items and fast food.  Choose unsalted snack foods.  Buy lower sodium products, often labeled as "lower sodium" or "no salt added."  Check food labels to learn how much sodium is in 1 serving.  When eating at a restaurant, ask that your food be prepared with less salt or none, if possible. READING FOOD LABELS FOR SODIUM INFORMATION The nutrition facts label is a good place to find how much sodium is in foods. Look for products with no more than 500 to 600 mg of sodium per meal and no more than 150 mg per serving. Remember that 2 g = 2000 mg. The food label may also list foods as:  Sodium-free: Less than 5 mg in a serving.  Very low sodium: 35 mg or less in a serving.  Low-sodium: 140 mg or less in a serving.  Light in sodium: 50% less sodium in a serving. For example, if a food that usually has 300 mg of sodium is changed to become light in sodium, it will have 150 mg of sodium.  Reduced sodium: 25% less sodium in a serving. For example, if a food that usually has 400 mg of sodium is changed to reduced sodium, it will have 300 mg of sodium. CHOOSING FOODS Grains  Avoid: Salted crackers and snack items. Some cereals, including instant hot cereals. Bread stuffing and biscuit mixes. Seasoned rice or pasta mixes.  Choose: Unsalted snack items. Low-sodium cereals, oats,  puffed wheat and rice, shredded wheat. English muffins and bread. Pasta. Meats  Avoid: Salted, canned, smoked, spiced, pickled meats, including fish and poultry. Bacon, ham, sausage, cold cuts, hot dogs, anchovies.  Choose: Low-sodium canned tuna and salmon. Fresh or frozen meat, poultry, and fish. Dairy  Avoid: Processed cheese and spreads. Cottage cheese. Buttermilk and condensed milk. Regular cheese.  Choose: Milk. Low-sodium cottage cheese. Yogurt. Sour cream. Low-sodium cheese. Fruits and Vegetables  Avoid: Regular canned vegetables. Regular canned tomato sauce and paste. Frozen vegetables in sauces. Olives. Pickles. Relishes. Sauerkraut.  Choose: Low-sodium canned vegetables. Low-sodium tomato sauce and paste. Frozen or fresh vegetables. Fresh and frozen fruit. Condiments  Avoid: Canned and packaged gravies. Worcestershire sauce. Tartar sauce. Barbecue sauce. Soy sauce. Steak sauce. Ketchup. Onion, garlic, and table salt. Meat flavorings and tenderizers.  Choose: Fresh and dried herbs and spices. Low-sodium varieties of mustard and ketchup. Lemon juice. Tabasco sauce. Horseradish. SAMPLE 2 GRAM SODIUM MEAL PLAN Breakfast / Sodium (mg)  1 cup low-fat milk / 143 mg  2 slices whole-wheat toast / 270 mg  1 tbs heart-healthy margarine / 153 mg  1 hard-boiled egg / 139 mg  1 small orange / 0 mg Lunch / Sodium (mg)  1 cup raw carrots / 76 mg   cup hummus / 298 mg  1 cup low-fat milk /   143 mg   cup red grapes / 2 mg  1 whole-wheat pita bread / 356 mg Dinner / Sodium (mg)  1 cup whole-wheat pasta / 2 mg  1 cup low-sodium tomato sauce / 73 mg  3 oz lean ground beef / 57 mg  1 small side salad (1 cup raw spinach leaves,  cup cucumber,  cup yellow bell pepper) with 1 tsp olive oil and 1 tsp red wine vinegar / 25 mg Snack / Sodium (mg)  1 container low-fat vanilla yogurt / 107 mg  3 graham cracker squares / 127 mg Nutrient Analysis  Calories: 2033  Protein:  77 g  Carbohydrate: 282 g  Fat: 72 g  Sodium: 1971 mg Document Released: 12/05/2005 Document Revised: 02/27/2012 Document Reviewed: 03/08/2010 [employer] Patient Information 2014 [employer].  

## 2014-02-16 ENCOUNTER — Encounter: Payer: Self-pay | Admitting: Family Medicine

## 2014-03-06 ENCOUNTER — Ambulatory Visit (HOSPITAL_COMMUNITY)
Admission: RE | Admit: 2014-03-06 | Discharge: 2014-03-06 | Disposition: A | Discharge status: Home / Self Care | Payer: BC Managed Care – PPO | Source: Ambulatory Visit | Attending: Internal Medicine | Admitting: Internal Medicine

## 2014-03-06 ENCOUNTER — Ambulatory Visit: Payer: BC Managed Care – PPO | Attending: Internal Medicine | Admitting: Pharmacist

## 2014-03-06 ENCOUNTER — Other Ambulatory Visit: Payer: Self-pay | Admitting: Internal Medicine

## 2014-03-06 VITALS — BP 143/87 | HR 64 | Temp 98.0°F | Resp 17

## 2014-03-06 DIAGNOSIS — I10 Essential (primary) hypertension: Secondary | ICD-10-CM

## 2014-03-06 DIAGNOSIS — Z1231 Encounter for screening mammogram for malignant neoplasm of breast: Secondary | ICD-10-CM

## 2014-03-06 DIAGNOSIS — Z139 Encounter for screening, unspecified: Secondary | ICD-10-CM

## 2014-03-06 MED ORDER — METOPROLOL TARTRATE 50 MG PO TABS: 50.0000 mg | ORAL_TABLET | Freq: Two times a day (BID) | ORAL | Status: DC

## 2014-03-06 NOTE — Progress Notes (Signed)
Patient here for blood pressure check only. 

## 2014-03-06 NOTE — Progress Notes (Signed)
   Subjective:    Patient ID: Mary [name], female    DOB: 1966-05-30, 48 y.o.   MRN: [id number]  HPI    Review of Systems     Objective:   Physical Exam        Assessment & Plan:

## 2014-03-07 ENCOUNTER — Telehealth: Payer: Self-pay | Admitting: Emergency Medicine

## 2014-03-07 NOTE — Telephone Encounter (Signed)
Tracey from speech therapy is calling on behalf of pt re certification to continue therapy from 2/11-5/6 Fax number given.I will have Dr. Annitta Needs sign and fax information

## 2014-03-10 ENCOUNTER — Other Ambulatory Visit: Payer: Self-pay | Admitting: Internal Medicine

## 2014-03-10 DIAGNOSIS — Z1231 Encounter for screening mammogram for malignant neoplasm of breast: Secondary | ICD-10-CM

## 2014-03-12 ENCOUNTER — Ambulatory Visit (HOSPITAL_COMMUNITY): Payer: BC Managed Care – PPO

## 2014-03-12 ENCOUNTER — Ambulatory Visit (HOSPITAL_COMMUNITY)
Admission: RE | Admit: 2014-03-12 | Discharge: 2014-03-12 | Disposition: A | Discharge status: Home / Self Care | Payer: BC Managed Care – PPO | Source: Ambulatory Visit | Attending: Internal Medicine | Admitting: Internal Medicine

## 2014-03-12 DIAGNOSIS — Z1231 Encounter for screening mammogram for malignant neoplasm of breast: Secondary | ICD-10-CM | POA: Insufficient documentation

## 2014-03-12 IMAGING — MG MM DIGITAL SCREENING BILAT
8 of 9 series · 8 of 9 positions shown · non-contrast
Comparison: [DATE] at [REDACTED]-[HOSPITAL].

CLINICAL DATA: Screening.

EXAM:
DIGITAL SCREENING BILATERAL MAMMOGRAM WITH CAD

[R CC]
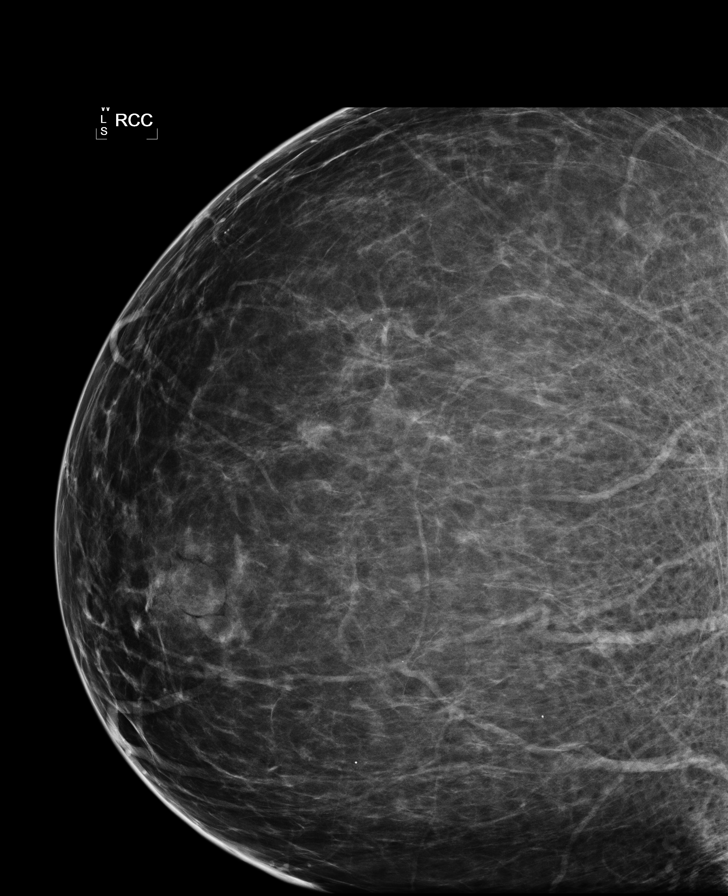

[R MLO (1 of 2)]
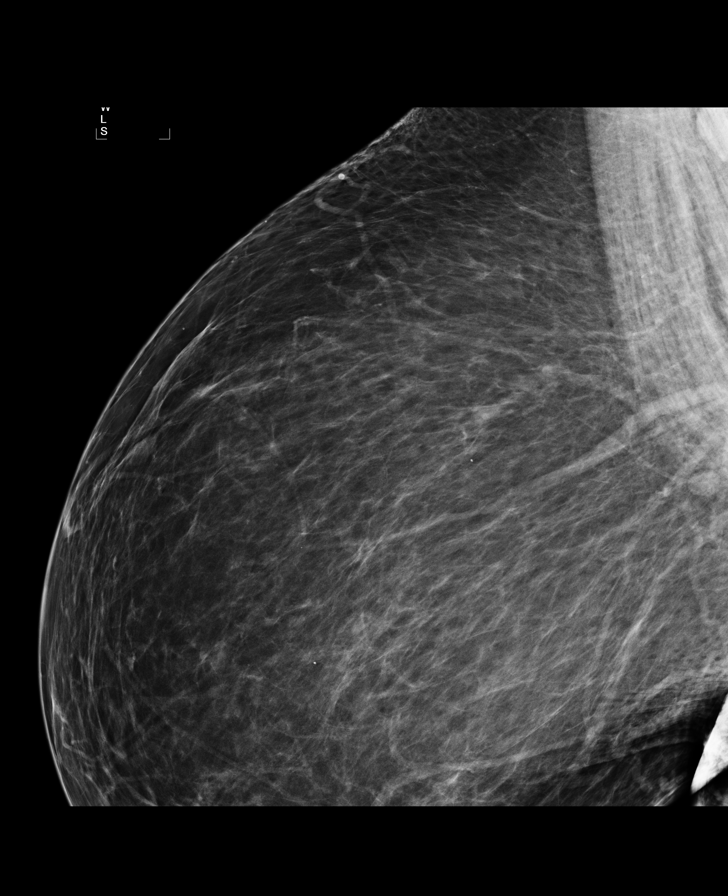

[L CC]
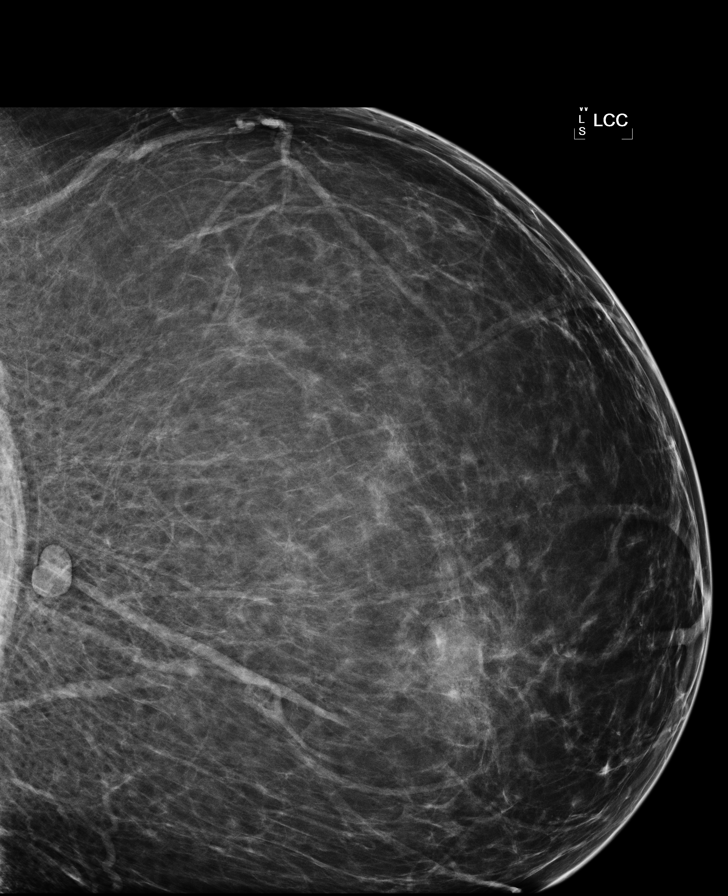

[L MLO (1 of 3)]
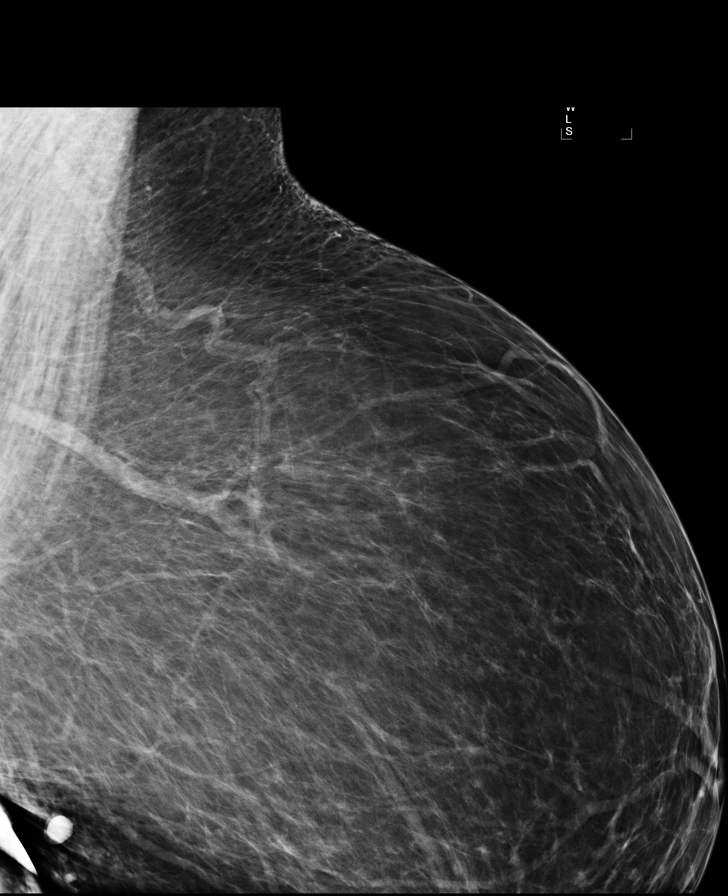

[L CV]
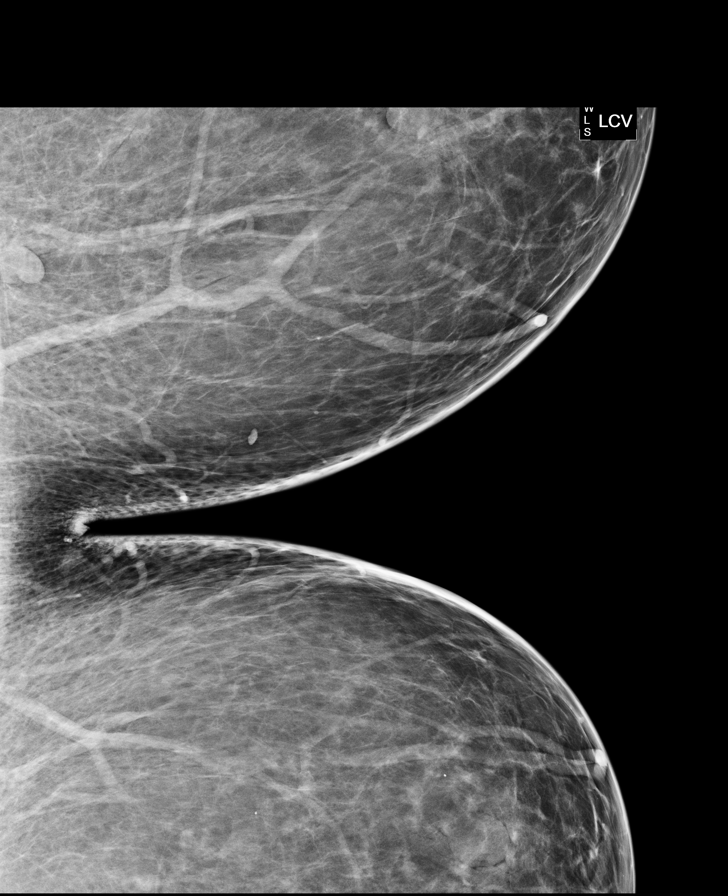

[L MLO (2 of 3)]
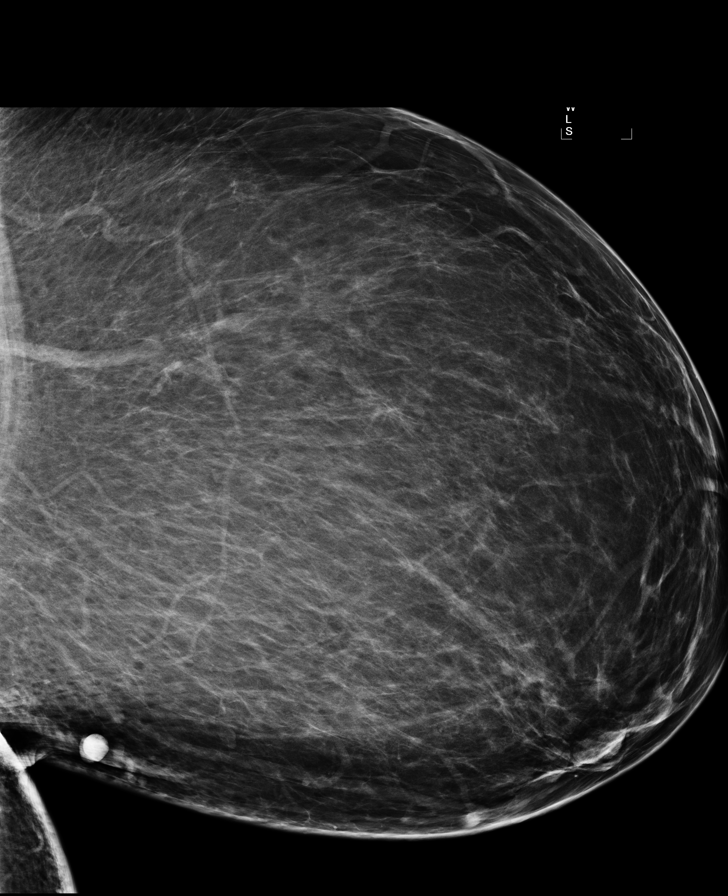

[R MLO (2 of 2)]
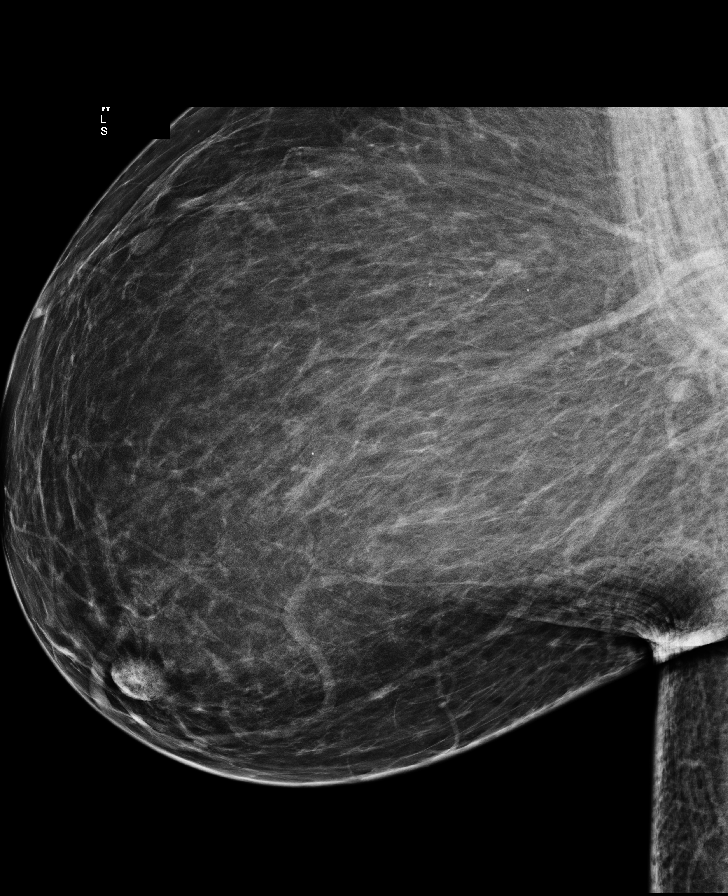

[L MLO (3 of 3)]
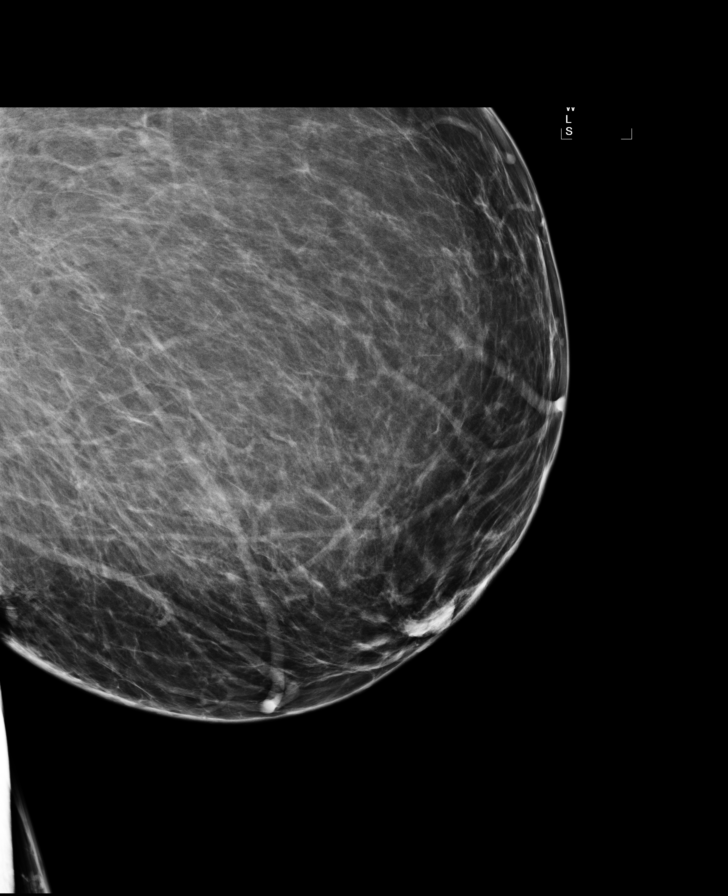

[8 of 9 positions shown; findings below may reference images not displayed]

ACR Breast Density Category b: There are scattered areas of
fibroglandular density.
FINDINGS: There are no findings suspicious for malignancy. Images were
processed with CAD.
IMPRESSION: No mammographic evidence of malignancy. A result letter of this
screening mammogram will be mailed directly to the patient.

RECOMMENDATION:
Screening mammogram in one year. (Code:[VW])

BI-RADS CATEGORY  1: Negative.

## 2014-03-19 ENCOUNTER — Encounter: Payer: Self-pay | Admitting: Family Medicine

## 2014-03-24 ENCOUNTER — Other Ambulatory Visit: Payer: Self-pay | Admitting: Emergency Medicine

## 2014-03-24 DIAGNOSIS — I10 Essential (primary) hypertension: Secondary | ICD-10-CM

## 2014-03-24 MED ORDER — METOPROLOL TARTRATE 50 MG PO TABS: 50.0000 mg | ORAL_TABLET | Freq: Two times a day (BID) | ORAL | Status: DC

## 2014-04-14 ENCOUNTER — Ambulatory Visit: Payer: MEDICAID | Admitting: Nurse Practitioner

## 2014-04-18 ENCOUNTER — Encounter: Payer: Self-pay | Admitting: Nurse Practitioner

## 2014-04-18 ENCOUNTER — Encounter: Payer: Self-pay | Admitting: Family Medicine

## 2014-04-18 ENCOUNTER — Encounter (INDEPENDENT_AMBULATORY_CARE_PROVIDER_SITE_OTHER): Payer: Self-pay

## 2014-04-18 ENCOUNTER — Ambulatory Visit (INDEPENDENT_AMBULATORY_CARE_PROVIDER_SITE_OTHER): Payer: BC Managed Care – PPO | Admitting: Nurse Practitioner

## 2014-04-18 VITALS — BP 155/93 | HR 73 | Ht 65.0 in | Wt 290.0 lb

## 2014-04-18 DIAGNOSIS — I639 Cerebral infarction, unspecified: Secondary | ICD-10-CM

## 2014-04-18 DIAGNOSIS — IMO0002 Reserved for concepts with insufficient information to code with codable children: Secondary | ICD-10-CM

## 2014-04-18 DIAGNOSIS — R4701 Aphasia: Secondary | ICD-10-CM

## 2014-04-18 DIAGNOSIS — I6350 Cerebral infarction due to unspecified occlusion or stenosis of unspecified cerebral artery: Secondary | ICD-10-CM

## 2014-04-18 DIAGNOSIS — R480 Dyslexia and alexia: Secondary | ICD-10-CM

## 2014-04-18 NOTE — Patient Instructions (Signed)
Start aspirin 81mg  daily for secondary stroke prevention.  Strict control of hypertension with blood pressure goal below 130/90. Continue speech therapy twice weekly. Follow up in 6 months, sooner as needed.

## 2014-04-18 NOTE — Progress Notes (Signed)
PATIENT: Mary [name] DOB: 12/05/1966  REASON FOR VISIT: follow up for sroke HISTORY FROM: patient  HISTORY OF PRESENT ILLNESS: Mary [name] is a 48 year obese young lady seen for first office followup visit following hospital admission for stroke on 01/23/13. Mary [name] presented with several days history of slurred speech and expressive language difficulties. Her symptoms actually began after an ovarian biopsy and Mary [name] initially came to the ER and was discharged home but symptoms persisted and a few days later Mary [name] returned and CT scan of the head this time showed a subacute left parietal MCA branch infarct. CT angiogram of the brain showed occluded distal branch of left middle cerebral artery corresponding to the infarct territory. CT angiogram of the neck showed no significant extracranial stenosis. MRI of the brain could not be obtained due to patient's obesity as well as severe claustrophobia. Transthoracic echo showed normal ejection fraction. Transesophageal echocardiogram showed no patent foramen ovale, vegetation or clot. EKG and telemetry monitoring did not reveal and cardiac arrhythmias. ESR,complements. ANA panel, RPR, HIV, antithrombin III and homocystine were all normal. The patient had significant expressive aphasia and was seen by physical, occupational and speech therapy and who was found to have deep vein thrombosis and hence was started on anticoagulation with xarelto. Mary [name] was discharged and has continued to have outpatient speech therapy and has obtained some modest improvement in her speech. Mary [name] still has significant word hesitancy but is able to communicate. Mary [name] complains of mild headaches every other day for which Mary [name] takes Tylenol which seemed to work quite well. 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  Mary [name] returns for followup after last visit on 04/15/13. Mary [name] reports no significant change in her expressive  language difficulties. Mary [name] has no new complaints today. Mary [name] still not working and has questions about when can Mary [name] drive. TCD bubble study was negative.   UPDATE 10/14/13 (LL): Mary [name] comes back for stroke revisit. Mary [name] is doing well, PCP changed her from Norvasc to Metoprolol because Mary [name] was having frequent headaches, which has helped. Mary [name] stopped Nortriptyline due to lethargy and it was not helping her headaches. Mary [name] may need to restart Norvasc though, because BP is elevated today, 162/98. Mary [name] is still in speech therapy which has helped her global aphasia. Mary [name] was able to get disability but is still having trouble paying bills, would like to work part-time but unsure if Mary [name] will be able to find something because of her cognitive disabilities and speech. Her mood is better today.  UPDATE 04/18/14 (LL):  Since last visit, Mary [name] returned to working part-time at [employer] in an different job role.  Mary [name] is very frustrated that Mary [name] is not able to function in her previous capacity.  Mary [name] continues with ST twice weekly.  Her blood pressure is better controlled on Norvasc and Metoprolol, though elevated in the office today at 155/93.  Mary [name] states her headaches are less frequent.   REVIEW OF SYSTEMS: Full 14 system review of systems performed and notable only for:  headache  ALLERGIES: No Known Allergies  PHYSICAL EXAM  Filed Vitals:   04/18/14 1427  BP: 155/93  Pulse: 73  Height: $Remove'5\' 5"'WTjJlzd$  (1.651 m)  Weight: 290 lb (131.543 kg)   Body mass index is 48.26 kg/(m^2).  General: well developed, obese, seated, in no evident distress  Head: head normocephalic and atraumatic. Orohparynx benign  Neck: supple with no carotid or  supraclavicular bruits  Cardiovascular: regular rate and rhythm, no murmurs  Musculoskeletal: no deformity  Skin: no rash/petichiae  Vascular: Normal pulses all extremities   Neurologic Exam  Mental Status: Awake and fully alert. Oriented to place and time. Recent and remote memory  intact. Attention span, concentration and fund of knowledge appropriate. Mood and affect appropriate. moderate expressive aphasia with word finding difficulties, paraphasic errors and disfluency. Leon comprehension.  Cranial Nerves: Pupils equal, briskly reactive to light. Extraocular movements full without nystagmus. Visual fields full to confrontation. Hearing intact. Facial sensation intact. Face, tongue, palate moves normally and symmetrically.  Motor: Normal bulk and tone. Normal strength in all tested extremity muscles.  Sensory.: intact to tough and pinprick and vibratory.  Coordination: Rapid alternating movements normal in all extremities. Finger-to-nose and heel-to-shin performed accurately bilaterally.  Gait and Station: Arises from chair without difficulty. Stance is normal. Gait demonstrates normal stride length and balance . Able to heel, toe and tandem walk without difficulty.  Reflexes: 1+ and symmetric.  ASSESSMENT AND PLAN 48 year old lady with subacute left parietal lobe infarct and February 2014 with significant residual global aphasia likely of embolic etiology without definite identified source of embolism. Vascular risk factors of hypertension, obesity and suspected sleep apnea. History of deep vein thrombosis, completed xarelto. Still struggling with residual global aphasia and attending speech therapy.  Mary [name] was able to go back to work 20 hours a week, in a different job role than Mary [name] was in pre-stroke.  PLAN:  Discussed cryptogenic stroke and recommended Medtronic LINQ loop recorder for her, gave her information packet to review at home.  Mary [name] is interested in having this procedure if affordable with her insurance.  Referral made to Dr. Rayann Heman, Cardiac EP lab. Continue aspirin $RemoveBeforeDE'81mg'PBHgppWSCdLghlC$  daily for secondary stroke prevention. Strict control of hypertension with blood pressure goal below 130/90. Follow up in 6 months.   Orders Placed This Encounter  Procedures  . Ambulatory  referral to Cardiac Electrophysiology   Return in about 6 months (around 10/19/2014).  Philmore Pali, MSN, NP-C 04/18/2014, 4:08 PM [facility] [address] [place], [place] [address] [phone]  Note: This document was prepared with digital dictation and possible smart phrase technology. Any transcriptional errors that result from this process are unintentional.

## 2014-04-22 ENCOUNTER — Telehealth: Payer: Self-pay | Admitting: *Deleted

## 2014-04-22 NOTE — Telephone Encounter (Signed)
Received referral for implantable loop recorder for cryptogenic stroke from [facility].  Called and spoke with patient's mother who requests office visit to discuss procedure and rationale.  Will have office call patient this afternoon (she was not available this morning) to schedule appointment with Dr Rayann Heman to discuss ILR.

## 2014-04-25 ENCOUNTER — Ambulatory Visit (INDEPENDENT_AMBULATORY_CARE_PROVIDER_SITE_OTHER): Payer: BC Managed Care – PPO | Admitting: Internal Medicine

## 2014-04-25 ENCOUNTER — Encounter (HOSPITAL_COMMUNITY)
Admission: AD | Disposition: A | Discharge status: Home / Self Care | Payer: Self-pay | Source: Ambulatory Visit | Attending: Internal Medicine

## 2014-04-25 ENCOUNTER — Encounter: Payer: Self-pay | Admitting: Internal Medicine

## 2014-04-25 ENCOUNTER — Other Ambulatory Visit: Payer: Self-pay | Admitting: *Deleted

## 2014-04-25 ENCOUNTER — Ambulatory Visit (HOSPITAL_COMMUNITY)
Admission: AD | Admit: 2014-04-25 | Discharge: 2014-04-25 | Disposition: A | Discharge status: Home / Self Care | Payer: BC Managed Care – PPO | Source: Ambulatory Visit | Attending: Internal Medicine | Admitting: Internal Medicine

## 2014-04-25 VITALS — BP 136/79 | HR 61 | Ht 65.0 in | Wt 290.0 lb

## 2014-04-25 DIAGNOSIS — I6350 Cerebral infarction due to unspecified occlusion or stenosis of unspecified cerebral artery: Secondary | ICD-10-CM

## 2014-04-25 DIAGNOSIS — I69920 Aphasia following unspecified cerebrovascular disease: Secondary | ICD-10-CM | POA: Insufficient documentation

## 2014-04-25 DIAGNOSIS — I10 Essential (primary) hypertension: Secondary | ICD-10-CM | POA: Insufficient documentation

## 2014-04-25 DIAGNOSIS — I639 Cerebral infarction, unspecified: Secondary | ICD-10-CM

## 2014-04-25 HISTORY — PX: LOOP RECORDER IMPLANT: SHX5477

## 2014-04-25 SURGERY — LOOP RECORDER IMPLANT
Anesthesia: LOCAL

## 2014-04-25 MED ORDER — LIDOCAINE-EPINEPHRINE 1 %-1:100000 IJ SOLN
INTRAMUSCULAR | Status: AC
Start: 2014-04-25 — End: 2014-04-25
  Filled 2014-04-25: qty 1

## 2014-04-25 NOTE — Discharge Instructions (Signed)
LOOP INSERTION SUPPLEMENTAL DISCHARGE INSTRUCTIONS GIVEN.

## 2014-04-25 NOTE — H&P (View-Only) (Signed)
Primary Care Physician: Oren Binet, MD Referring Physician: Merrily Brittle, NP   Mary [name] is a 48 y.o. female with a h/o hypertension and cryptogenic stroke in 01/2014.  Work up at that time was unrevealing.  She has recovered but has some residual expressive aphasia and headaches.  She is referred to EP for consideration of ILR to evaluate for atrial arrhythmias as a cause for her CVA.   Today, she denies symptoms of palpitations, chest pain, shortness of breath, orthopnea, PND, lower extremity edema, dizziness, presyncope, syncope, or neurologic sequela. The patient is tolerating medications without difficulties and is otherwise without complaint today.   Past Medical History  Diagnosis Date  . Hypertension   . Other and unspecified ovarian cysts   . Stroke    Past Surgical History  Procedure Laterality Date  . Ovarian cyst removal    . Tee without cardioversion N/A 01/25/2013    Procedure: TRANSESOPHAGEAL ECHOCARDIOGRAM (TEE);  Surgeon: Birdie Riddle, MD;  Location: [facility] ENDOSCOPY;  Service: Cardiovascular;  Laterality: N/A;    Current Outpatient Prescriptions  Medication Sig Dispense Refill  . amLODipine (NORVASC) 5 MG tablet Take 1 tablet (5 mg total) by mouth daily.  90 tablet  3  . metoprolol (LOPRESSOR) 50 MG tablet Take 1 tablet (50 mg total) by mouth 2 (two) times daily.  60 tablet  1   Current Facility-Administered Medications  Medication Dose Route Frequency Provider Last Rate Last Dose  . cloNIDine (CATAPRES) tablet 0.1 mg  0.1 mg Oral Once Ripudeep Krystal Eaton, MD        No Known Allergies  History   Social History  . Marital Status: Single    Spouse Name: N/A    Number of Children: 2  . Years of Education: N/A   Occupational History  . Not on file.   Social History Main Topics  . Smoking status: Never Smoker   . Smokeless tobacco: Not on file  . Alcohol Use: No  . Drug Use: No  . Sexual Activity: Yes   Other Topics Concern  . Not on file    Social History Narrative   Patient lives at home with family    Patient drinks soda    Family History  Problem Relation Age of Onset  . Diabetes Mellitus II Mother   . Diabetes Mother   . Stroke Other   . Diabetes Other   . Transient ischemic attack Father   . Diabetes Sister     ROS- All systems are reviewed and negative except as per the HPI above  Physical Exam: Filed Vitals:   04/25/14 1135  BP: 136/79  Pulse: 61  Height: 5\' 5"  (1.651 m)  Weight: 290 lb (131.543 kg)    GEN- The patient is overweight appearing, alert and oriented x 3 today.   Head- normocephalic, atraumatic Eyes-  Sclera clear, conjunctiva pink Ears- hearing intact Oropharynx- clear Neck- supple, no JVP Lymph- no cervical lymphadenopathy Lungs- Clear to ausculation bilaterally, normal work of breathing Heart- Regular rate and rhythm, no murmurs, rubs or gallops, PMI not laterally displaced GI- soft, NT, ND, + BS Extremities- no clubbing, cyanosis, or edema MS- no significant deformity or atrophy Skin- no rash or lesion Psych- euthymic mood, full affect Neuro- strength and sensation are intact  EKG- nsr 61 bpm, otherwise normal ekg Epic records are reviewed  Assessment and Plan: 1. Cryptogenic stroke The patient presents with cryptogenic stroke.     I spoke at length with the patient about monitoring  for afib with an implantable loop recorder.  Risks, benefits, and alteratives to implantable loop recorder were discussed with the patient today.   At this time, the patient is very clear in their decision to proceed with implantable loop recorder.  We will schedule at this time  2.  HTN Stable No change required today  Follow-up with neurology as scheduled We will follow through the ILR

## 2014-04-25 NOTE — Interval H&P Note (Signed)
History and Physical Interval Note:  04/25/2014 2:09 PM  Mary [name]  has presented today for surgery, with the diagnosis of syncope  The various methods of treatment have been discussed with the patient and family. After consideration of risks, benefits and other options for treatment, the patient has consented to  Procedure(s): LOOP RECORDER IMPLANT (N/A) as a surgical intervention .  The patient's history has been reviewed, patient examined, no change in status, stable for surgery.  I have reviewed the patient's chart and labs.  Questions were answered to the patient's satisfaction.     Thompson Grayer

## 2014-04-25 NOTE — Patient Instructions (Signed)
We are sending you across the street to the hospital for a LINQ insertion today.

## 2014-04-25 NOTE — CV Procedure (Signed)
SURGEON:  Thompson Grayer, MD     PREPROCEDURE DIAGNOSIS:  Cryptogenic Stroke    POSTPROCEDURE DIAGNOSIS:  Cryptogenic Stroke     PROCEDURES:   1. Implantable loop recorder implantation    INTRODUCTION:  Mary [name] is a 48 y.o. female with a history of unexplained stroke 2/14 who presents today for implantable loop implantation.  The patient has had a cryptogenic stroke.  Despite an extensive workup by neurology, no reversible causes have been identified.  she has worn telemetry during which she did not have arrhythmias while in the hospital at that time.  There is significant concern for possible atrial fibrillation as the cause for the patients stroke.  The patient therefore presents today for implantable loop implantation.     DESCRIPTION OF PROCEDURE:  Informed written consent was obtained, and the patient was brought to the electrophysiology lab in a fasting state.  The patient required no sedation for the procedure today.  Mapping over the patient's chest was performed by the EP lab staff to identify the area where electrograms were most prominent for ILR recording.  This area was found to be the left parasternal region over the 3rd-4th intercostal space. The patients left chest was therefore prepped and draped in the usual sterile fashion by the EP lab staff. The skin overlying the left parasternal region was infiltrated with lidocaine for local analgesia.  A 0.5-cm incision was made over the left parasternal region over the 3rd intercostal space.  A subcutaneous ILR pocket was fashioned using a combination of sharp and blunt dissection.  A Medtronic Reveal [place] model [id number] implantable loop recorder was then placed into the pocket  R waves were very prominent and measured 0.23mV.  Steri- Strips and a sterile dressing were then applied.  There were no early apparent complications.     CONCLUSIONS:   1. Successful implantation of a Medtronic Reveal LINQ implantable loop recorder  for cryptogenic stroke  2. No early apparent complications.

## 2014-04-25 NOTE — Progress Notes (Signed)
Primary Care Physician: Oren Binet, MD Referring Physician: Merrily Brittle, NP   Mary [name] is a 48 y.o. female with a h/o hypertension and cryptogenic stroke in 01/2014.  Work up at that time was unrevealing.  She has recovered but has some residual expressive aphasia and headaches.  She is referred to EP for consideration of ILR to evaluate for atrial arrhythmias as a cause for her CVA.   Today, she denies symptoms of palpitations, chest pain, shortness of breath, orthopnea, PND, lower extremity edema, dizziness, presyncope, syncope, or neurologic sequela. The patient is tolerating medications without difficulties and is otherwise without complaint today.   Past Medical History  Diagnosis Date  . Hypertension   . Other and unspecified ovarian cysts   . Stroke    Past Surgical History  Procedure Laterality Date  . Ovarian cyst removal    . Tee without cardioversion N/A 01/25/2013    Procedure: TRANSESOPHAGEAL ECHOCARDIOGRAM (TEE);  Surgeon: Birdie Riddle, MD;  Location: [facility] ENDOSCOPY;  Service: Cardiovascular;  Laterality: N/A;    Current Outpatient Prescriptions  Medication Sig Dispense Refill  . amLODipine (NORVASC) 5 MG tablet Take 1 tablet (5 mg total) by mouth daily.  90 tablet  3  . metoprolol (LOPRESSOR) 50 MG tablet Take 1 tablet (50 mg total) by mouth 2 (two) times daily.  60 tablet  1   Current Facility-Administered Medications  Medication Dose Route Frequency Provider Last Rate Last Dose  . cloNIDine (CATAPRES) tablet 0.1 mg  0.1 mg Oral Once Ripudeep Krystal Eaton, MD        No Known Allergies  History   Social History  . Marital Status: Single    Spouse Name: N/A    Number of Children: 2  . Years of Education: N/A   Occupational History  . Not on file.   Social History Main Topics  . Smoking status: Never Smoker   . Smokeless tobacco: Not on file  . Alcohol Use: No  . Drug Use: No  . Sexual Activity: Yes   Other Topics Concern  . Not on file    Social History Narrative   Patient lives at home with family    Patient drinks soda    Family History  Problem Relation Age of Onset  . Diabetes Mellitus II Mother   . Diabetes Mother   . Stroke Other   . Diabetes Other   . Transient ischemic attack Father   . Diabetes Sister     ROS- All systems are reviewed and negative except as per the HPI above  Physical Exam: Filed Vitals:   04/25/14 1135  BP: 136/79  Pulse: 61  Height: 5\' 5"  (1.651 m)  Weight: 290 lb (131.543 kg)    GEN- The patient is overweight appearing, alert and oriented x 3 today.   Head- normocephalic, atraumatic Eyes-  Sclera clear, conjunctiva pink Ears- hearing intact Oropharynx- clear Neck- supple, no JVP Lymph- no cervical lymphadenopathy Lungs- Clear to ausculation bilaterally, normal work of breathing Heart- Regular rate and rhythm, no murmurs, rubs or gallops, PMI not laterally displaced GI- soft, NT, ND, + BS Extremities- no clubbing, cyanosis, or edema MS- no significant deformity or atrophy Skin- no rash or lesion Psych- euthymic mood, full affect Neuro- strength and sensation are intact  EKG- nsr 61 bpm, otherwise normal ekg Epic records are reviewed  Assessment and Plan: 1. Cryptogenic stroke The patient presents with cryptogenic stroke.     I spoke at length with the patient about monitoring  for afib with an implantable loop recorder.  Risks, benefits, and alteratives to implantable loop recorder were discussed with the patient today.   At this time, the patient is very clear in their decision to proceed with implantable loop recorder.  We will schedule at this time  2.  HTN Stable No change required today  Follow-up with neurology as scheduled We will follow through the ILR

## 2014-04-29 ENCOUNTER — Other Ambulatory Visit: Payer: Self-pay

## 2014-05-05 ENCOUNTER — Ambulatory Visit: Payer: BC Managed Care – PPO

## 2014-05-08 ENCOUNTER — Ambulatory Visit: Payer: Self-pay | Admitting: Internal Medicine

## 2014-05-15 ENCOUNTER — Encounter: Payer: Self-pay | Admitting: Internal Medicine

## 2014-05-19 ENCOUNTER — Encounter: Payer: Self-pay | Admitting: Family Medicine

## 2014-05-26 ENCOUNTER — Ambulatory Visit (INDEPENDENT_AMBULATORY_CARE_PROVIDER_SITE_OTHER): Payer: BC Managed Care – PPO | Admitting: *Deleted

## 2014-05-26 DIAGNOSIS — I639 Cerebral infarction, unspecified: Secondary | ICD-10-CM

## 2014-05-26 DIAGNOSIS — I6350 Cerebral infarction due to unspecified occlusion or stenosis of unspecified cerebral artery: Secondary | ICD-10-CM

## 2014-05-26 LAB — MDC_IDC_ENUM_SESS_TYPE_REMOTE

## 2014-05-30 ENCOUNTER — Telehealth: Payer: Self-pay | Admitting: Internal Medicine

## 2014-05-30 ENCOUNTER — Ambulatory Visit: Payer: BC Managed Care – PPO | Attending: Internal Medicine | Admitting: Internal Medicine

## 2014-05-30 ENCOUNTER — Encounter: Payer: Self-pay | Admitting: Internal Medicine

## 2014-05-30 VITALS — BP 131/80 | HR 74 | Temp 98.2°F | Resp 16 | Wt 293.8 lb

## 2014-05-30 DIAGNOSIS — I10 Essential (primary) hypertension: Secondary | ICD-10-CM | POA: Insufficient documentation

## 2014-05-30 DIAGNOSIS — L408 Other psoriasis: Secondary | ICD-10-CM | POA: Insufficient documentation

## 2014-05-30 DIAGNOSIS — Z8673 Personal history of transient ischemic attack (TIA), and cerebral infarction without residual deficits: Secondary | ICD-10-CM | POA: Insufficient documentation

## 2014-05-30 DIAGNOSIS — K029 Dental caries, unspecified: Secondary | ICD-10-CM | POA: Insufficient documentation

## 2014-05-30 DIAGNOSIS — Z139 Encounter for screening, unspecified: Secondary | ICD-10-CM

## 2014-05-30 DIAGNOSIS — L409 Psoriasis, unspecified: Secondary | ICD-10-CM

## 2014-05-30 DIAGNOSIS — K089 Disorder of teeth and supporting structures, unspecified: Secondary | ICD-10-CM | POA: Insufficient documentation

## 2014-05-30 DIAGNOSIS — K0889 Other specified disorders of teeth and supporting structures: Secondary | ICD-10-CM

## 2014-05-30 LAB — CBC WITH DIFFERENTIAL/PLATELET
Basophils Absolute: 0.1 10*3/uL (ref 0.0–0.1)
Basophils Relative: 1 % (ref 0–1)
EOS PCT: 3 % (ref 0–5)
Eosinophils Absolute: 0.2 10*3/uL (ref 0.0–0.7)
HEMATOCRIT: 34.9 % — AB (ref 36.0–46.0)
Hemoglobin: 11.3 g/dL — ABNORMAL LOW (ref 12.0–15.0)
LYMPHS ABS: 2.1 10*3/uL (ref 0.7–4.0)
LYMPHS PCT: 42 % (ref 12–46)
MCH: 21.8 pg — ABNORMAL LOW (ref 26.0–34.0)
MCHC: 32.4 g/dL (ref 30.0–36.0)
MCV: 67.2 fL — AB (ref 78.0–100.0)
MONO ABS: 0.3 10*3/uL (ref 0.1–1.0)
Monocytes Relative: 6 % (ref 3–12)
NEUTROS ABS: 2.4 10*3/uL (ref 1.7–7.7)
Neutrophils Relative %: 48 % (ref 43–77)
Platelets: 281 10*3/uL (ref 150–400)
RBC: 5.19 MIL/uL — AB (ref 3.87–5.11)
RDW: 17.6 % — ABNORMAL HIGH (ref 11.5–15.5)
WBC: 5.1 10*3/uL (ref 4.0–10.5)

## 2014-05-30 MED ORDER — AMOXICILLIN-POT CLAVULANATE 875-125 MG PO TABS: 1.0000 | ORAL_TABLET | Freq: Two times a day (BID) | ORAL | Status: DC

## 2014-05-30 NOTE — Progress Notes (Signed)
Patient here for follow up from her stroke Complains of tooth pain and swelling to the left side of her mouth That started a couple of days ago Needs referral to dermatology Has been to the eye dr a few months ago and has been having some bouts Of blurred vision-wears corrective lenses

## 2014-05-30 NOTE — Telephone Encounter (Signed)
Patient calling about blood pressure medication, she had appt today but didn't receive refill. Please F/U with patient

## 2014-05-30 NOTE — Progress Notes (Signed)
MRN: [id number] Name: Mary [name]  Sex: female Age: 48 y.o. DOB: 1966-02-23  Allergies: Review of patient's allergies indicates no known allergies.  Chief Complaint  Patient presents with  . Dental Pain    HPI: Patient is 48 y.o. female who has to of hypertension, stroke, comes today for followup, her blood pressure is improved  compared to last visit, still has some memory deficits and has been following up with her neurologist, patient reported to have lot of dental pain left lower jaw it was more swollen yesterday but slightly improved today, she has history of dental cavities and has not seen a dentist for several years, she denies smoking cigarettes, she also has skin patch/rash on her lower back, as per patient she was told this was psoriasis, she's not seen a dermatologist.  Past Medical History  Diagnosis Date  . Hypertension   . Other and unspecified ovarian cysts   . Stroke     Past Surgical History  Procedure Laterality Date  . Ovarian cyst removal    . Tee without cardioversion N/A 01/25/2013    Procedure: TRANSESOPHAGEAL ECHOCARDIOGRAM (TEE);  Surgeon: Birdie Riddle, MD;  Location: [facility] ENDOSCOPY;  Service: Cardiovascular;  Laterality: N/A;      Medication List       This list is accurate as of: 05/30/14 10:20 AM.  Always use your most recent med list.               amLODipine 5 MG tablet  Commonly known as:  NORVASC  Take 1 tablet (5 mg total) by mouth daily.     amoxicillin-clavulanate 875-125 MG per tablet  Commonly known as:  AUGMENTIN  Take 1 tablet by mouth 2 (two) times daily.     metoprolol 50 MG tablet  Commonly known as:  LOPRESSOR  Take 1 tablet (50 mg total) by mouth 2 (two) times daily.        Meds ordered this encounter  Medications  . amoxicillin-clavulanate (AUGMENTIN) 875-125 MG per tablet    Sig: Take 1 tablet by mouth 2 (two) times daily.    Dispense:  20 tablet    Refill:  0     There is no immunization history on  file for this patient.  Family History  Problem Relation Age of Onset  . Diabetes Mellitus II Mother   . Diabetes Mother   . Stroke Other   . Diabetes Other   . Transient ischemic attack Father   . Diabetes Sister     History  Substance Use Topics  . Smoking status: Never Smoker   . Smokeless tobacco: Not on file  . Alcohol Use: No    Review of Systems   As noted in HPI  Filed Vitals:   05/30/14 0950  BP: 131/80  Pulse: 74  Temp: 98.2 F (36.8 C)  Resp: 16    Physical Exam  Physical Exam  Constitutional: No distress.  HENT:  Left lower jaw teeth dental cavities, old fillings   Eyes: EOM are normal. Pupils are equal, round, and reactive to light.  Cardiovascular: Normal rate and regular rhythm.   Pulmonary/Chest: Breath sounds normal. No respiratory distress. She has no wheezes. She has no rales.  Skin:  Lower back psoriatic patch     CBC    Component Value Date/Time   WBC 5.3 01/29/2013 0627   RBC 5.13* 01/29/2013 0627   HGB 11.9* 01/29/2013 0627   HCT 35.9* 01/29/2013 0627   PLT 292  01/29/2013 0627   MCV 70.0* 01/29/2013 0627   LYMPHSABS 1.5 01/24/2013 0555   MONOABS 0.4 01/24/2013 0555   EOSABS 0.5 01/24/2013 0555   BASOSABS 0.0 01/24/2013 0555    CMP     Component Value Date/Time   NA 140 01/29/2013 0627   K 3.8 01/29/2013 0627   CL 106 01/29/2013 0627   CO2 24 01/29/2013 0627   GLUCOSE 99 01/29/2013 0627   BUN 8 01/29/2013 0627   CREATININE 0.73 01/29/2013 0627   CALCIUM 8.8 01/29/2013 0627   PROT 7.5 01/27/2013 0948   ALBUMIN 3.2* 01/27/2013 0948   AST 19 01/27/2013 0948   ALT 21 01/27/2013 0948   ALKPHOS 58 01/27/2013 0948   BILITOT 0.2* 01/27/2013 0948   GFRNONAA >90 01/29/2013 0627   GFRAA >90 01/29/2013 0627    Lab Results  Component Value Date/Time   CHOL 128 01/24/2013  5:55 AM    No components found with this basename: hga1c    Lab Results  Component Value Date/Time   AST 19 01/27/2013  9:48 AM    Assessment and Plan  Pain, dental - Plan:  Ambulatory referral to Dentistry, amoxicillin-clavulanate (AUGMENTIN) 875-125 MG per tablet  HTN (hypertension) Blood pressure is well controlled continue with Norvasc and metoprolol. DASH diet.  H/O: stroke For her with the neurologist  Psoriasis - Plan: Ambulatory referral to Dermatology  Dental cavities - Plan: Ambulatory referral to Dentistry  Screening - Plan: Baseline fasting blood work  CBC with Differential, COMPLETE METABOLIC PANEL WITH GFR, TSH, Lipid panel, Vit D  25 hydroxy (rtn osteoporosis monitoring)   Health Maintenance  -Mammogram: Up-to-date.   Return in about 3 months (around 08/30/2014) for hypertension, stroke.  Lorayne Marek, MD

## 2014-05-31 LAB — COMPLETE METABOLIC PANEL WITH GFR
ALBUMIN: 3.6 g/dL (ref 3.5–5.2)
ALT: 9 U/L (ref 0–35)
AST: 15 U/L (ref 0–37)
Alkaline Phosphatase: 52 U/L (ref 39–117)
BUN: 12 mg/dL (ref 6–23)
CO2: 25 meq/L (ref 19–32)
CREATININE: 0.76 mg/dL (ref 0.50–1.10)
Calcium: 8.4 mg/dL (ref 8.4–10.5)
Chloride: 103 mEq/L (ref 96–112)
GFR, Est Non African American: >89 mL/min
Glucose, Bld: 96 mg/dL (ref 70–99)
POTASSIUM: 4 meq/L (ref 3.5–5.3)
Sodium: 137 mEq/L (ref 135–145)
Total Bilirubin: 0.4 mg/dL (ref 0.2–1.2)
Total Protein: 7.5 g/dL (ref 6.0–8.3)

## 2014-05-31 LAB — LIPID PANEL
CHOLESTEROL: 126 mg/dL (ref 0–200)
HDL: 40 mg/dL (ref >39)
LDL CALC: 68 mg/dL (ref 0–99)
Total CHOL/HDL Ratio: 3.2 Ratio
Triglycerides: 91 mg/dL (ref <150)
VLDL: 18 mg/dL (ref 0–40)

## 2014-05-31 LAB — TSH: TSH: 1.903 u[IU]/mL (ref 0.350–4.500)

## 2014-05-31 LAB — VITAMIN D 25 HYDROXY (VIT D DEFICIENCY, FRACTURES): VIT D 25 HYDROXY: 12 ng/mL — AB (ref 30–89)

## 2014-06-02 ENCOUNTER — Telehealth: Payer: Self-pay

## 2014-06-02 ENCOUNTER — Other Ambulatory Visit: Payer: Self-pay | Admitting: Internal Medicine

## 2014-06-02 DIAGNOSIS — I10 Essential (primary) hypertension: Secondary | ICD-10-CM

## 2014-06-02 MED ORDER — METOPROLOL TARTRATE 50 MG PO TABS: 50.0000 mg | ORAL_TABLET | Freq: Two times a day (BID) | ORAL | Status: DC

## 2014-06-02 MED ORDER — VITAMIN D (ERGOCALCIFEROL) 1.25 MG (50000 UNIT) PO CAPS: 50000.0000 [IU] | ORAL_CAPSULE | ORAL | Status: DC

## 2014-06-02 NOTE — Telephone Encounter (Signed)
Message copied by Dorothe Pea on Mon Jun 02, 2014  2:15 PM ------      Message from: Lorayne Marek      Created: Mon Jun 02, 2014  9:18 AM       Blood work reviewed, noticed low vitamin D, call patient advise to start ergocalciferol 50,000 units once a week for the duration of  12 weeks.      Also patient has anemia, advise patient to take over-the-counter iron supplement daily. ------

## 2014-06-02 NOTE — Telephone Encounter (Signed)
Patient unavailable Unable to leave message on home number-no voice mail

## 2014-06-11 ENCOUNTER — Ambulatory Visit (INDEPENDENT_AMBULATORY_CARE_PROVIDER_SITE_OTHER): Payer: BC Managed Care – PPO | Admitting: *Deleted

## 2014-06-11 DIAGNOSIS — I6350 Cerebral infarction due to unspecified occlusion or stenosis of unspecified cerebral artery: Secondary | ICD-10-CM

## 2014-06-11 DIAGNOSIS — I639 Cerebral infarction, unspecified: Secondary | ICD-10-CM

## 2014-06-11 LAB — MDC_IDC_ENUM_SESS_TYPE_INCLINIC

## 2014-06-11 MED ORDER — METOPROLOL TARTRATE 25 MG PO TABS: 25.0000 mg | ORAL_TABLET | Freq: Two times a day (BID) | ORAL | Status: DC

## 2014-06-11 NOTE — Progress Notes (Signed)
Wound check in clinic s/p ILR implant. Wound without redness or edema.  Pt with 0 tachy episodes; 0 brady episodes; 1 asystole episode x 3.2 sec + dizziness--- taking Lopressor 80. JA saw pt and advised her to decrease Lopressor to 25 BID. Plan to follow up via [facility] and with JA PRN (CVA pt).

## 2014-06-18 ENCOUNTER — Encounter: Payer: Self-pay | Admitting: Family Medicine

## 2014-06-25 ENCOUNTER — Encounter: Payer: Self-pay | Admitting: Internal Medicine

## 2014-06-26 ENCOUNTER — Ambulatory Visit (INDEPENDENT_AMBULATORY_CARE_PROVIDER_SITE_OTHER): Payer: BC Managed Care – PPO | Admitting: *Deleted

## 2014-06-26 DIAGNOSIS — I639 Cerebral infarction, unspecified: Secondary | ICD-10-CM

## 2014-06-26 DIAGNOSIS — I6350 Cerebral infarction due to unspecified occlusion or stenosis of unspecified cerebral artery: Secondary | ICD-10-CM

## 2014-06-26 LAB — MDC_IDC_ENUM_SESS_TYPE_REMOTE

## 2014-07-07 ENCOUNTER — Telehealth: Payer: Self-pay | Admitting: Internal Medicine

## 2014-07-07 NOTE — Telephone Encounter (Signed)
New problem:             Pt needs a note to prove hardship to [employer].  For help with her car note (bill).  Stating pt has is unable to work and is on disability puls her device pt would like to pick.   Pt will sing up for MyChart. If any questions please give pt a call.

## 2014-07-07 NOTE — Progress Notes (Signed)
Loop recorder 

## 2014-07-07 NOTE — Telephone Encounter (Signed)
Spoke with patient and let her know to call the Neuro office for the letter as Dr Rayann Heman did not write for her disability

## 2014-07-19 ENCOUNTER — Encounter: Payer: Self-pay | Admitting: Family Medicine

## 2014-07-25 ENCOUNTER — Ambulatory Visit (INDEPENDENT_AMBULATORY_CARE_PROVIDER_SITE_OTHER): Payer: BC Managed Care – PPO | Admitting: *Deleted

## 2014-07-25 DIAGNOSIS — I6350 Cerebral infarction due to unspecified occlusion or stenosis of unspecified cerebral artery: Secondary | ICD-10-CM

## 2014-07-25 LAB — MDC_IDC_ENUM_SESS_TYPE_REMOTE

## 2014-07-31 NOTE — Progress Notes (Signed)
Loop recorder 

## 2014-08-19 ENCOUNTER — Encounter: Payer: Self-pay | Admitting: Family Medicine

## 2014-08-19 ENCOUNTER — Other Ambulatory Visit: Payer: Self-pay | Admitting: Internal Medicine

## 2014-08-26 ENCOUNTER — Ambulatory Visit (INDEPENDENT_AMBULATORY_CARE_PROVIDER_SITE_OTHER): Payer: BC Managed Care – PPO | Admitting: *Deleted

## 2014-08-26 DIAGNOSIS — I639 Cerebral infarction, unspecified: Secondary | ICD-10-CM

## 2014-08-26 DIAGNOSIS — I6350 Cerebral infarction due to unspecified occlusion or stenosis of unspecified cerebral artery: Secondary | ICD-10-CM

## 2014-08-29 NOTE — Progress Notes (Signed)
Loop recorder 

## 2014-09-03 ENCOUNTER — Encounter: Payer: Self-pay | Admitting: Internal Medicine

## 2014-09-05 ENCOUNTER — Other Ambulatory Visit: Payer: Self-pay | Admitting: Internal Medicine

## 2014-09-08 ENCOUNTER — Encounter: Payer: Self-pay | Admitting: Internal Medicine

## 2014-09-08 ENCOUNTER — Ambulatory Visit: Payer: BC Managed Care – PPO | Attending: Internal Medicine | Admitting: Internal Medicine

## 2014-09-08 VITALS — BP 150/89 | HR 64 | Temp 98.7°F | Resp 18 | Ht 65.0 in | Wt 298.0 lb

## 2014-09-08 DIAGNOSIS — E559 Vitamin D deficiency, unspecified: Secondary | ICD-10-CM | POA: Diagnosis not present

## 2014-09-08 DIAGNOSIS — D649 Anemia, unspecified: Secondary | ICD-10-CM

## 2014-09-08 DIAGNOSIS — I10 Essential (primary) hypertension: Secondary | ICD-10-CM | POA: Diagnosis present

## 2014-09-08 LAB — MDC_IDC_ENUM_SESS_TYPE_REMOTE

## 2014-09-08 MED ORDER — AMLODIPINE BESYLATE 5 MG PO TABS: 10.0000 mg | ORAL_TABLET | Freq: Every day | ORAL | Status: DC

## 2014-09-08 MED ORDER — AMLODIPINE BESYLATE 10 MG PO TABS: 10.0000 mg | ORAL_TABLET | Freq: Every day | ORAL | Status: DC

## 2014-09-08 MED ORDER — METOPROLOL TARTRATE 25 MG PO TABS: 25.0000 mg | ORAL_TABLET | Freq: Two times a day (BID) | ORAL | Status: DC

## 2014-09-08 MED ORDER — FERROUS SULFATE 325 (65 FE) MG PO TABS
325.0000 mg | ORAL_TABLET | Freq: Three times a day (TID) | ORAL | Status: DC
Start: 2014-09-08 — End: 2014-10-07

## 2014-09-08 NOTE — Progress Notes (Signed)
Pt here for f/u HTN with medical management Pt is taking prescribed Amlodopine and Metoprolol 25 mg decreased by Cardiologist from 50 mg tab Pt c/o feeling tired more BP 150/89 64 Denies chest pain  Short term memory delay noted s/p stroke

## 2014-09-08 NOTE — Patient Instructions (Signed)
DASH Eating Plan °DASH stands for "Dietary Approaches to Stop Hypertension." The DASH eating plan is a healthy eating plan that has been shown to reduce high blood pressure (hypertension). Additional health benefits may include reducing the risk of type 2 diabetes mellitus, heart disease, and stroke. The DASH eating plan may also help with weight loss. °WHAT DO I NEED TO KNOW ABOUT THE DASH EATING PLAN? °For the DASH eating plan, you will follow these general guidelines: °· Choose foods with a percent daily value for sodium of less than 5% (as listed on the food label). °· Use salt-free seasonings or herbs instead of table salt or sea salt. °· Check with your health care provider or pharmacist before using salt substitutes. °· Eat lower-sodium products, often labeled as "lower sodium" or "no salt added." °· Eat fresh foods. °· Eat more vegetables, fruits, and low-fat dairy products. °· Choose whole grains. Look for the word "whole" as the first word in the ingredient list. °· Choose fish and skinless chicken or turkey more often than red meat. Limit fish, poultry, and meat to 6 oz (170 g) each day. °· Limit sweets, desserts, sugars, and sugary drinks. °· Choose heart-healthy fats. °· Limit cheese to 1 oz (28 g) per day. °· Eat more home-cooked food and less restaurant, buffet, and fast food. °· Limit fried foods. °· Cook foods using methods other than frying. °· Limit canned vegetables. If you do use them, rinse them well to decrease the sodium. °· When eating at a restaurant, ask that your food be prepared with less salt, or no salt if possible. °WHAT FOODS CAN I EAT? °Seek help from a dietitian for individual calorie needs. °Grains °Whole grain or whole wheat bread. Brown rice. Whole grain or whole wheat pasta. Quinoa, bulgur, and whole grain cereals. Low-sodium cereals. Corn or whole wheat flour tortillas. Whole grain cornbread. Whole grain crackers. Low-sodium crackers. °Vegetables °Fresh or frozen vegetables  (raw, steamed, roasted, or grilled). Low-sodium or reduced-sodium tomato and vegetable juices. Low-sodium or reduced-sodium tomato sauce and paste. Low-sodium or reduced-sodium canned vegetables.  °Fruits °All fresh, canned (in natural juice), or frozen fruits. °Meat and Other Protein Products °Ground beef (85% or leaner), grass-fed beef, or beef trimmed of fat. Skinless chicken or turkey. Ground chicken or turkey. Pork trimmed of fat. All fish and seafood. Eggs. Dried beans, peas, or lentils. Unsalted nuts and seeds. Unsalted canned beans. °Dairy °Low-fat dairy products, such as skim or 1% milk, 2% or reduced-fat cheeses, low-fat ricotta or cottage cheese, or plain low-fat yogurt. Low-sodium or reduced-sodium cheeses. °Fats and Oils °Tub margarines without trans fats. Light or reduced-fat mayonnaise and salad dressings (reduced sodium). Avocado. Safflower, olive, or canola oils. Natural peanut or almond butter. °Other °Unsalted popcorn and pretzels. °The items listed above may not be a complete list of recommended foods or beverages. Contact your dietitian for more options. °WHAT FOODS ARE NOT RECOMMENDED? °Grains °White bread. White pasta. White rice. Refined cornbread. Bagels and croissants. Crackers that contain trans fat. °Vegetables °Creamed or fried vegetables. Vegetables in a cheese sauce. Regular canned vegetables. Regular canned tomato sauce and paste. Regular tomato and vegetable juices. °Fruits °Dried fruits. Canned fruit in light or heavy syrup. Fruit juice. °Meat and Other Protein Products °Fatty cuts of meat. Ribs, chicken wings, bacon, sausage, bologna, salami, chitterlings, fatback, hot dogs, bratwurst, and packaged luncheon meats. Salted nuts and seeds. Canned beans with salt. °Dairy °Whole or 2% milk, cream, half-and-half, and cream cheese. Whole-fat or sweetened yogurt. Full-fat   cheeses or blue cheese. Nondairy creamers and whipped toppings. Processed cheese, cheese spreads, or cheese  curds. °Condiments °Onion and garlic salt, seasoned salt, table salt, and sea salt. Canned and packaged gravies. Worcestershire sauce. Tartar sauce. Barbecue sauce. Teriyaki sauce. Soy sauce, including reduced sodium. Steak sauce. Fish sauce. Oyster sauce. Cocktail sauce. Horseradish. Ketchup and mustard. Meat flavorings and tenderizers. Bouillon cubes. Hot sauce. Tabasco sauce. Marinades. Taco seasonings. Relishes. °Fats and Oils °Butter, stick margarine, lard, shortening, ghee, and bacon fat. Coconut, palm kernel, or palm oils. Regular salad dressings. °Other °Pickles and olives. Salted popcorn and pretzels. °The items listed above may not be a complete list of foods and beverages to avoid. Contact your dietitian for more information. °WHERE CAN I FIND MORE INFORMATION? °National [employer], [employer], [employer] Institute: www.nhlbi.nih.gov/health/health-topics/topics/dash/ °Document Released: 11/24/2011 Document Revised: 04/21/2014 Document Reviewed: 10/09/2013 °ExitCare® Patient Information ©2015 [employer]. This information is not intended to replace advice given to you by your health care provider. Make sure you discuss any questions you have with your health care provider. ° °

## 2014-09-08 NOTE — Progress Notes (Signed)
MRN: [id number] Name: Mary [name]  Sex: female Age: 48 y.o. DOB: [birth year]  Allergies: Review of patient's allergies indicates no known allergies.  Chief Complaint  Patient presents with  . Follow-up  . Hypertension  . Fatigue    HPI: Patient is 48 y.o. female who has to of hypertension, stroke in the past, comes today for followup, today her blood pressure is borderline elevated as per patient she has checked her systolic blood pressure of 140 at home, she denies any headache dizziness chest and shortness of breath her does complain of some fatigue, previous blood work reviewed with the patient she has borderline anemia and irregular menstrual periods sometimes heavy, patient also history of vitamin D deficiency as per patient she completed the prescription dosage.  Past Medical History  Diagnosis Date  . Hypertension   . Other and unspecified ovarian cysts   . Stroke     Past Surgical History  Procedure Laterality Date  . Ovarian cyst removal    . Tee without cardioversion N/A 01/25/2013    Procedure: TRANSESOPHAGEAL ECHOCARDIOGRAM (TEE);  Surgeon: Birdie Riddle, MD;  Location: [facility] ENDOSCOPY;  Service: Cardiovascular;  Laterality: N/A;      Medication List       This list is accurate as of: 09/08/14  9:40 AM.  Always use your most recent med list.               amLODipine 5 MG tablet  Commonly known as:  NORVASC  Take 2 tablets (10 mg total) by mouth daily.     amoxicillin-clavulanate 875-125 MG per tablet  Commonly known as:  AUGMENTIN  Take 1 tablet by mouth 2 (two) times daily.     ferrous sulfate 325 (65 FE) MG tablet  Commonly known as:  FERROUSUL  Take 1 tablet (325 mg total) by mouth 3 (three) times daily with meals.     metoprolol tartrate 25 MG tablet  Commonly known as:  LOPRESSOR  Take 1 tablet (25 mg total) by mouth 2 (two) times daily.     Vitamin D (Ergocalciferol) 50000 UNITS Caps capsule  Commonly known as:  DRISDOL  Take 1 capsule  (50,000 Units total) by mouth every 7 (seven) days.        Meds ordered this encounter  Medications  . amLODipine (NORVASC) 5 MG tablet    Sig: Take 2 tablets (10 mg total) by mouth daily.    Dispense:  90 tablet    Refill:  3  . metoprolol tartrate (LOPRESSOR) 25 MG tablet    Sig: Take 1 tablet (25 mg total) by mouth 2 (two) times daily.    Dispense:  180 tablet    Refill:  3  . ferrous sulfate (FERROUSUL) 325 (65 FE) MG tablet    Sig: Take 1 tablet (325 mg total) by mouth 3 (three) times daily with meals.    Dispense:  90 tablet    Refill:  3     There is no immunization history on file for this patient.  Family History  Problem Relation Age of Onset  . Diabetes Mellitus II Mother   . Diabetes Mother   . Stroke Other   . Diabetes Other   . Transient ischemic attack Father   . Diabetes Sister     History  Substance Use Topics  . Smoking status: Never Smoker   . Smokeless tobacco: Not on file  . Alcohol Use: No    Review of Systems  As noted in HPI  Filed Vitals:   09/08/14 0918  BP: 150/89  Pulse: 64  Temp: 98.7 F (37.1 C)  Resp: 18    Physical Exam  Physical Exam  Constitutional: No distress.  Eyes: EOM are normal. Pupils are equal, round, and reactive to light.  Cardiovascular: Normal rate and regular rhythm.   Pulmonary/Chest: Breath sounds normal. No respiratory distress. She has no wheezes. She has no rales.  Musculoskeletal: She exhibits no edema.    CBC    Component Value Date/Time   WBC 5.1 05/30/2014 1025   RBC 5.19* 05/30/2014 1025   HGB 11.3* 05/30/2014 1025   HCT 34.9* 05/30/2014 1025   PLT 281 05/30/2014 1025   MCV 67.2* 05/30/2014 1025   LYMPHSABS 2.1 05/30/2014 1025   MONOABS 0.3 05/30/2014 1025   EOSABS 0.2 05/30/2014 1025   BASOSABS 0.1 05/30/2014 1025    CMP     Component Value Date/Time   NA 137 05/30/2014 1025   K 4.0 05/30/2014 1025   CL 103 05/30/2014 1025   CO2 25 05/30/2014 1025   GLUCOSE 96 05/30/2014 1025   BUN 12  05/30/2014 1025   CREATININE 0.76 05/30/2014 1025   CREATININE 0.73 01/29/2013 0627   CALCIUM 8.4 05/30/2014 1025   PROT 7.5 05/30/2014 1025   ALBUMIN 3.6 05/30/2014 1025   AST 15 05/30/2014 1025   ALT 9 05/30/2014 1025   ALKPHOS 52 05/30/2014 1025   BILITOT 0.4 05/30/2014 1025   GFRNONAA >89 05/30/2014 1025   GFRNONAA >90 01/29/2013 0627   GFRAA >89 05/30/2014 1025   GFRAA >90 01/29/2013 1287    Lab Results  Component Value Date/Time   CHOL 126 05/30/2014 10:25 AM    No components found with this basename: hga1c    Lab Results  Component Value Date/Time   AST 15 05/30/2014 10:25 AM    Assessment and Plan  Essential hypertension - Plan: Still uncontrolled, I advised patient for DASH diet increase the dose of amLODipine 10 mg tablet, continue with metoprolol tartrate (LOPRESSOR) 25 MG tablet  Unspecified vitamin D deficiency Patient is completed and finish the course of prescription dose vitamin D start taking over-the-counter 2000 units daily.  Anemia, unspecified - Plan: ferrous sulfate (FERROUSUL) 325 (65 FE) MG tablet   Health Maintenance  -Mammogram: uptodate  -Vaccinations:  Patient will get flu shot at her workplace    Return in about 3 months (around 12/08/2014) for hypertension.  Lorayne Marek, MD

## 2014-09-10 ENCOUNTER — Encounter: Payer: Self-pay | Admitting: Internal Medicine

## 2014-09-18 ENCOUNTER — Encounter: Payer: Self-pay | Admitting: Family Medicine

## 2014-09-19 ENCOUNTER — Encounter: Payer: Self-pay | Admitting: Internal Medicine

## 2014-09-24 ENCOUNTER — Ambulatory Visit (INDEPENDENT_AMBULATORY_CARE_PROVIDER_SITE_OTHER): Payer: BC Managed Care – PPO | Admitting: *Deleted

## 2014-09-24 DIAGNOSIS — I639 Cerebral infarction, unspecified: Secondary | ICD-10-CM

## 2014-09-24 LAB — MDC_IDC_ENUM_SESS_TYPE_REMOTE

## 2014-09-29 NOTE — Progress Notes (Signed)
Loop recorder 

## 2014-10-03 ENCOUNTER — Emergency Department: Payer: Self-pay | Admitting: Emergency Medicine

## 2014-10-03 ENCOUNTER — Telehealth: Payer: Self-pay

## 2014-10-03 LAB — COMPREHENSIVE METABOLIC PANEL
ALBUMIN: 3.1 g/dL — AB (ref 3.4–5.0)
AST: 28 U/L (ref 15–37)
Alkaline Phosphatase: 76 U/L
Anion Gap: 5 — ABNORMAL LOW (ref 7–16)
BILIRUBIN TOTAL: 0.3 mg/dL (ref 0.2–1.0)
BUN: 9 mg/dL (ref 7–18)
CHLORIDE: 106 mmol/L (ref 98–107)
CO2: 29 mmol/L (ref 21–32)
Calcium, Total: 8.2 mg/dL — ABNORMAL LOW (ref 8.5–10.1)
Creatinine: 0.81 mg/dL (ref 0.60–1.30)
EGFR (Non-African Amer.): >60
Glucose: 100 mg/dL — ABNORMAL HIGH (ref 65–99)
OSMOLALITY: 278 (ref 275–301)
POTASSIUM: 3.6 mmol/L (ref 3.5–5.1)
SGPT (ALT): 25 U/L
SODIUM: 140 mmol/L (ref 136–145)
TOTAL PROTEIN: 8.3 g/dL — AB (ref 6.4–8.2)

## 2014-10-03 LAB — CBC WITH DIFFERENTIAL/PLATELET
BASOS ABS: 0.1 10*3/uL (ref 0.0–0.1)
Basophil %: 1.3 %
Eosinophil #: 0.1 10*3/uL (ref 0.0–0.7)
Eosinophil %: 2.5 %
HCT: 36.5 % (ref 35.0–47.0)
HGB: 11.2 g/dL — ABNORMAL LOW (ref 12.0–16.0)
LYMPHS PCT: 37.9 %
Lymphocyte #: 2 10*3/uL (ref 1.0–3.6)
MCH: 21.5 pg — ABNORMAL LOW (ref 26.0–34.0)
MCHC: 30.8 g/dL — ABNORMAL LOW (ref 32.0–36.0)
MCV: 70 fL — AB (ref 80–100)
MONOS PCT: 6.2 %
Monocyte #: 0.3 x10 3/mm (ref 0.2–0.9)
NEUTROS PCT: 52.1 %
Neutrophil #: 2.7 10*3/uL (ref 1.4–6.5)
PLATELETS: 264 10*3/uL (ref 150–440)
RBC: 5.23 10*6/uL — ABNORMAL HIGH (ref 3.80–5.20)
RDW: 18 % — ABNORMAL HIGH (ref 11.5–14.5)
WBC: 5.2 10*3/uL (ref 3.6–11.0)

## 2014-10-03 LAB — URINALYSIS, COMPLETE
BILIRUBIN, UR: NEGATIVE
BLOOD: NEGATIVE
Bacteria: NONE SEEN
Glucose,UR: NEGATIVE mg/dL (ref 0–75)
Hyaline Cast: 2
Ketone: NEGATIVE
LEUKOCYTE ESTERASE: NEGATIVE
Nitrite: NEGATIVE
Ph: 6 (ref 4.5–8.0)
Protein: NEGATIVE
Specific Gravity: 1.011 (ref 1.003–1.030)
Squamous Epithelial: 3

## 2014-10-03 LAB — TROPONIN I: Troponin-I: <0.02 ng/mL

## 2014-10-03 IMAGING — CT CT HEAD WITHOUT CONTRAST
1 series · 16 of 30 positions shown, 20 images · non-contrast
Comparison: [DATE]

CLINICAL DATA: Confusion.  CVA last year.

EXAM:
CT HEAD WITHOUT CONTRAST
TECHNIQUE: Contiguous axial images were obtained from the base of the skull
through the vertex without intravenous contrast.

[Series 2: head wo · axial · 0.43mm/px · z∈[-62,+82]mm · 16 of 36 slices shown, 20 images]
[im 2/36  brain]
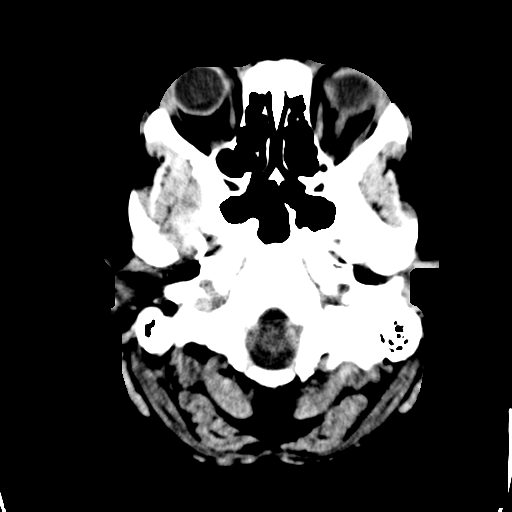
[im 2/36  bone]
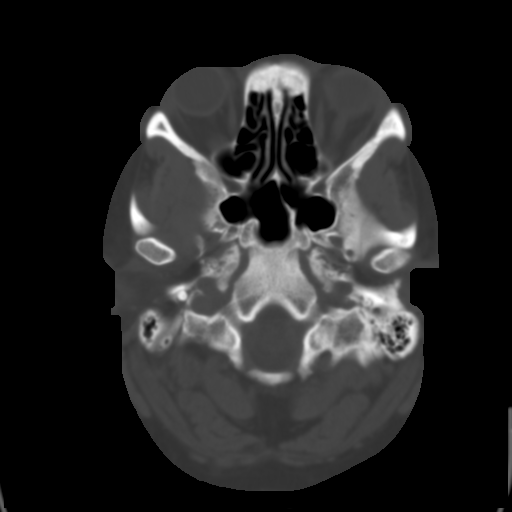
[im 4/36  brain]
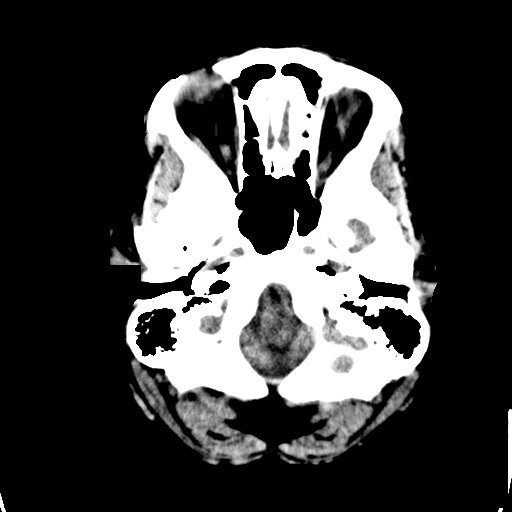
[im 7/36  brain]
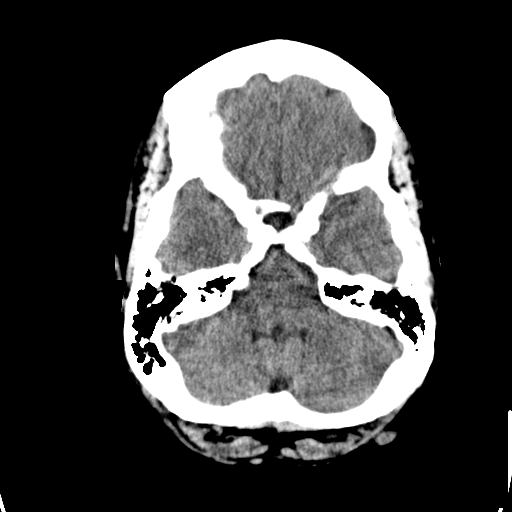
[im 9/36  brain]
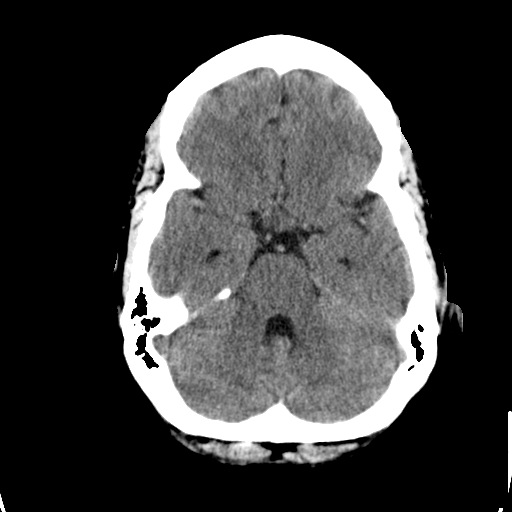
[im 10/36  brain]
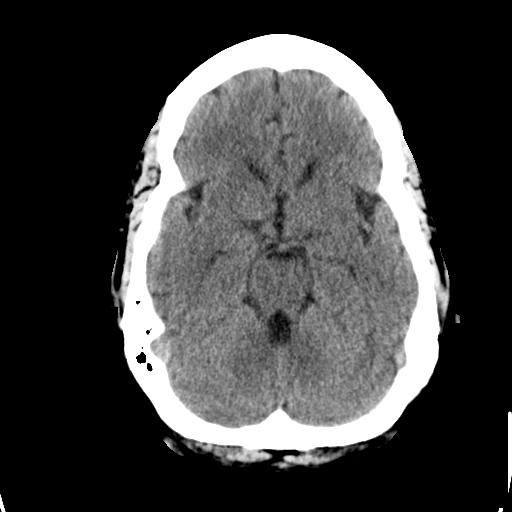
[im 10/36  bone]
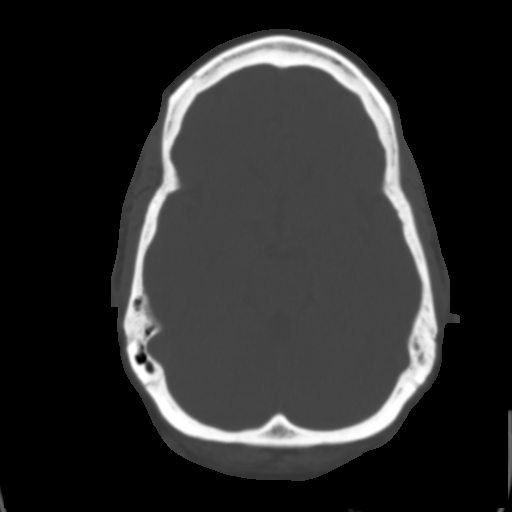
[im 13/36  brain]
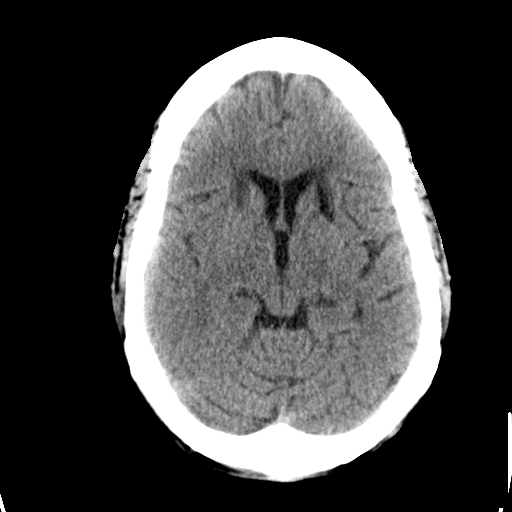
[im 15/36  brain]
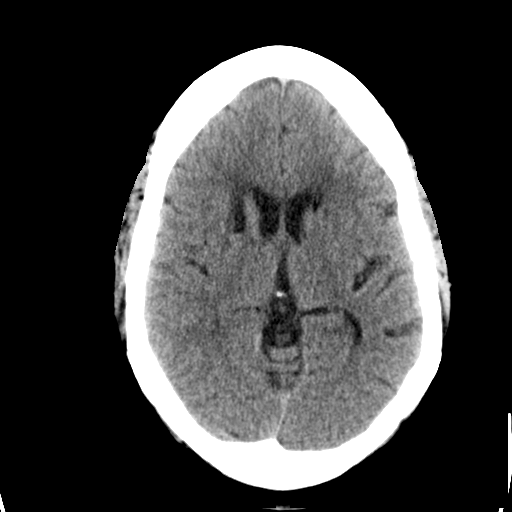
[im 17/36  brain]
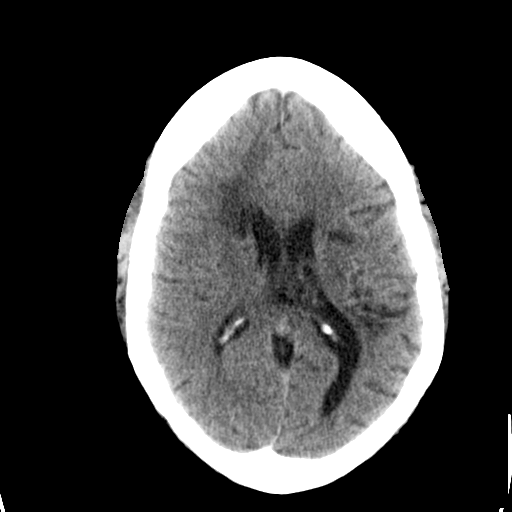
[im 19/36  brain]
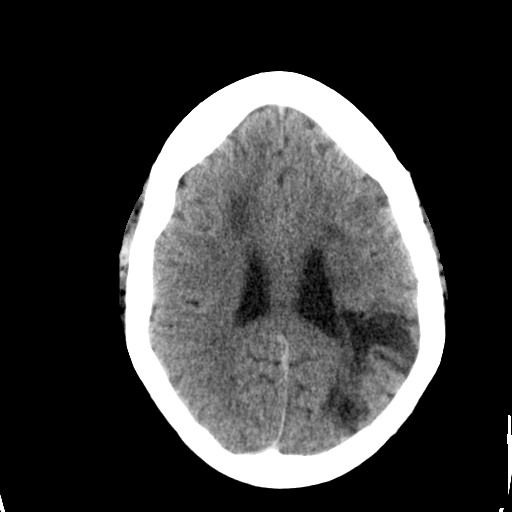
[im 19/36  bone]
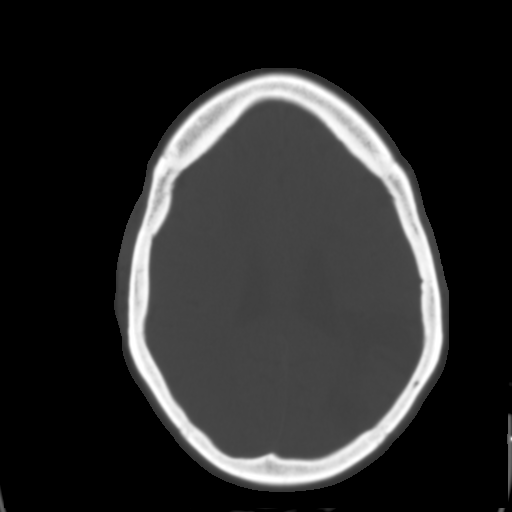
[im 21/36  brain]
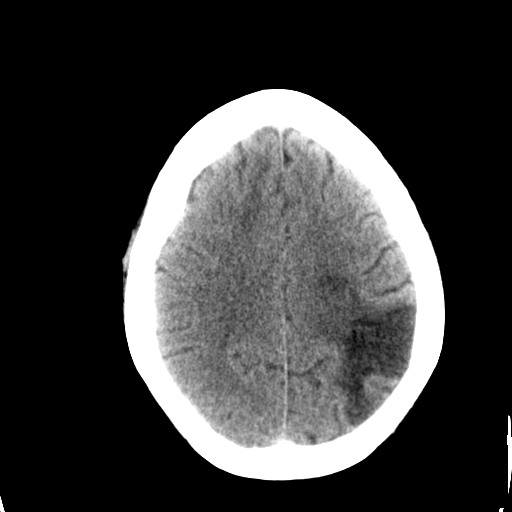
[im 23/36  brain]
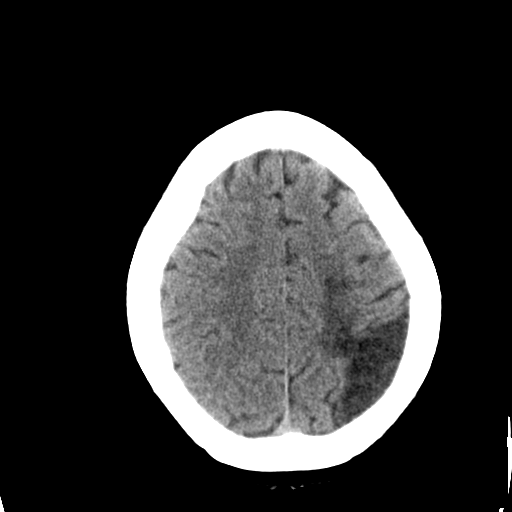
[im 26/36  brain]
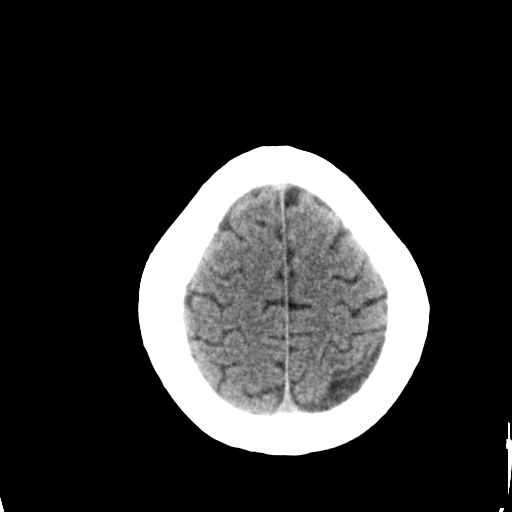
[im 27/36  brain]
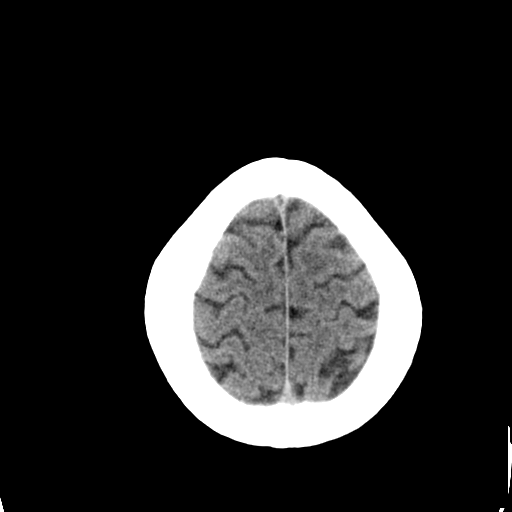
[im 27/36  bone]
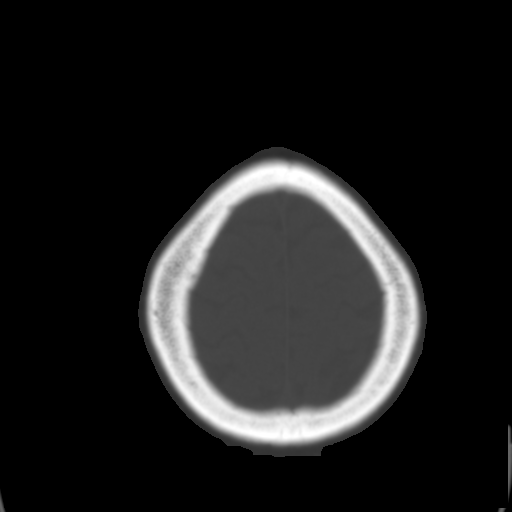
[im 29/36  brain]
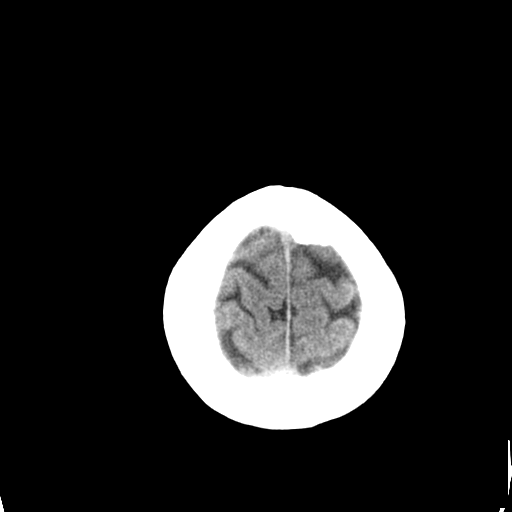
[im 32/36  brain]
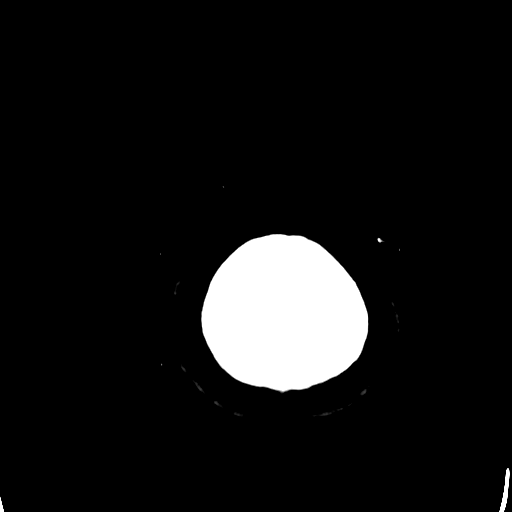
[im 34/36  brain]
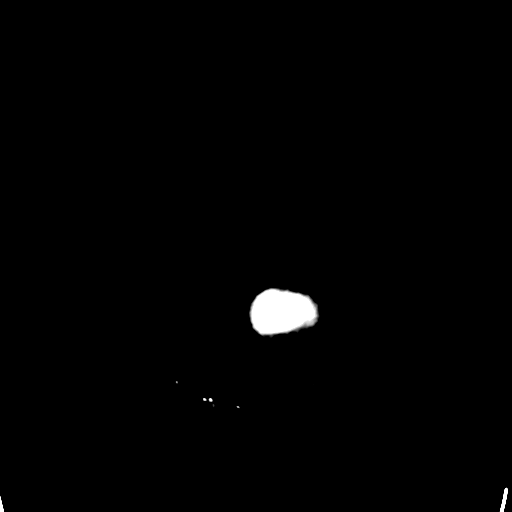

[16 of 30 positions shown; findings below may reference images not displayed]

FINDINGS: Remote encephalomalacia in the left parietal lobe, bilateral
internal capsule anterior limbs and adjacent caudate heads, and in
the periventricular white matter of the left frontal lobe as before.
Otherwise, the brainstem, cerebellum, cerebral peduncles, thalamus,
basal ganglia, basilar cisterns, and ventricular system appear
within normal limits.

Periventricular white matter and corona radiata hypodensities favor
chronic ischemic microvascular white matter disease. No intracranial
hemorrhage, mass lesion, or acute CVA.
IMPRESSION: 1. Remote infarcts. No acute or significant new abnormalities are
identified.

## 2014-10-03 NOTE — Telephone Encounter (Signed)
Called patient. Scheduled appt with Dr. Erlinda Hong per Dr. Leonie Man. Patient is a new consult existing patient. Sched appt.

## 2014-10-07 ENCOUNTER — Encounter: Payer: Self-pay | Admitting: Neurology

## 2014-10-07 ENCOUNTER — Ambulatory Visit (INDEPENDENT_AMBULATORY_CARE_PROVIDER_SITE_OTHER): Payer: BC Managed Care – PPO | Admitting: Neurology

## 2014-10-07 ENCOUNTER — Encounter: Payer: Self-pay | Admitting: Internal Medicine

## 2014-10-07 VITALS — BP 123/80 | HR 73 | Ht 65.0 in | Wt 300.2 lb

## 2014-10-07 DIAGNOSIS — E669 Obesity, unspecified: Secondary | ICD-10-CM

## 2014-10-07 DIAGNOSIS — I63412 Cerebral infarction due to embolism of left middle cerebral artery: Secondary | ICD-10-CM

## 2014-10-07 DIAGNOSIS — I10 Essential (primary) hypertension: Secondary | ICD-10-CM

## 2014-10-07 DIAGNOSIS — I639 Cerebral infarction, unspecified: Secondary | ICD-10-CM

## 2014-10-07 MED ORDER — ASPIRIN EC 325 MG PO TBEC: 325.0000 mg | DELAYED_RELEASE_TABLET | Freq: Every day | ORAL | Status: DC

## 2014-10-07 MED ORDER — ATORVASTATIN CALCIUM 10 MG PO TABS: 10.0000 mg | ORAL_TABLET | Freq: Every day | ORAL | Status: DC

## 2014-10-07 NOTE — Progress Notes (Signed)
STROKE NEUROLOGY FOLLOW UP NOTE  NAME: Mary [name] DOB: 1966/07/05  REASON FOR VISIT: stroke follow up HISTORY FROM: pt and chart  Today we had the pleasure of seeing Mary [name] in follow-up at our Neurology Clinic. Pt was accompanied by no one.   History Summary Mary [name] is a 48 year old lady with PMH of HTN and obesity was admitted for stroke on 01/23/13. She presented with several days history of slurred speech and expressive aphasia. Her symptoms actually began after an ovarian biopsy and she initially came to the ER and was discharged home but symptoms persisted and a few days later she returned and CT scan of the head this time showed a subacute left parietal MCA branch infarct. CT angiogram of the brain showed occluded distal branch of left middle cerebral artery corresponding to the infarct territory. CT angiogram of the neck showed no significant extracranial stenosis. MRI of the brain could not be obtained due to patient's obesity as well as severe claustrophobia. Transthoracic echo showed normal ejection fraction. Transesophageal echocardiogram showed no patent foramen ovale, vegetation or clot. EKG and telemetry monitoring did not reveal and cardiac arrhythmias. ESR,complements. ANA panel, RPR, HIV, antithrombin III and homocystine were all normal. The patient had significant expressive aphasia and was seen by physical, occupational and speech therapy and who was found to have deep vein thrombosis and hence was started on anticoagulation with xarelto. She was discharged and has continued to have outpatient speech therapy and has obtained some modest improvement in her speech. She still has significant word hesitancy but is able to communicate. She complains of mild headaches every other day for which she takes Tylenol which seemed to work quite well. She works as a Freight forwarder at [employer] and states she will be unable to perform her job in her current situation.   Follow up  04/15/13 - She reports no significant change in her expressive aphasia. She has no new complaints today. She still not working and has questions about when can she drive. TCD bubble study was negative.   Follow up 10/14/13 (LL): she comes back for stroke revisit. She is doing well, PCP changed her from Norvasc to Metoprolol because she was having frequent headaches, which has helped. She stopped Nortriptyline due to lethargy and it was not helping her headaches. She may need to restart Norvasc though, because BP is elevated today, 162/98. She is still in speech therapy .   Follow up 04/18/14 (LL): Since last visit, she returned to working part-time at [employer] in an different job role. She is very frustrated that she is not able to function in her previous capacity. She continues with ST twice weekly. Her blood pressure is better controlled on Norvasc and Metoprolol, though elevated in the office today at 155/93. She states her headaches are less frequent.   Interval History During the interval time, the patient has been doing OK. She is on norvac and metoprolol for BP control and today is 123/80. However, she stated that last Friday, she was working in [place], she felt funny feeling, not able to do what she was able to do before, she kept asking her co-workers, how to do this, how to do that, she has some difficulty understanding and express herself. She was thinking and talking slow, she felt not herself. On Saturday, she felt fine. She has not back to work yet. She went to her PCP and was told to be off work for a week and  follow up with neurology.   She was started on Xarelto for DVT in 01/2013 but stopped after 3 months. Suppose on ASA $Remo'81mg'MIPow$  as per chart, but currently she is not on any antiplatelet. She also had loop recorder placed in 05/2014 by Dr. Rayann Heman and so far no Afib episodes found. However, her hypercoagulable work up initially showed positive lupus coagulant, no repeat done yet.   REVIEW OF  SYSTEMS: Full 14 system review of systems performed and notable only for those listed below and in HPI above, all others are negative:  Constitutional: weight gain  Cardiovascular: N/A  Ear/Nose/Throat: N/A  Skin: N/A  Eyes: N/A  Respiratory: N/A  Gastroitestinal: N/A  Genitourinary: N/A Hematology/Lymphatic: N/A  Endocrine: N/A  Musculoskeletal: N/A  Allergy/Immunology: N/A  Neurological: confusion  Psychiatric: N/A  The following represents the patient's updated allergies and side effects list: No Known Allergies  Labs since last visit of relevance include the following:         Result Value Ref Range   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 Battery status OK. Normal device function. No symptom episodes, tachy episodes, brady or pause episodes. No AF episodes. Monthly summary reports and PRN with JA.    The neurologically relevant items on the patient's problem list were reviewed on today's visit.  Neurologic Examination  A problem focused neurological exam (12 or more points of the single system neurologic examination, vital signs counts as 1 point, cranial nerves count for 8 points) was performed.  Blood pressure 123/80, pulse 73, height $RemoveBe'5\' 5"'rGFPXWjsY$  (1.651 m), weight 300 lb 3.2 oz (136.17 kg).  General - Well nourished, well developed, in no apparent distress.  Ophthalmologic - not able to see through  Cardiovascular - Regular rate and rhythm with no murmur.  Mental Status -  Level of arousal and orientation to time, place, and person were intact. Language including expression, naming, repetition, comprehension was assessed and found intact.  Cranial Nerves II - XII - II - Visual field intact OU. III, IV, VI - Extraocular movements intact. V - Facial sensation intact bilaterally. VII - Facial  movement intact bilaterally. VIII - Hearing & vestibular intact bilaterally. X - Palate elevates symmetrically. XI - Chin turning & shoulder shrug intact bilaterally. XII - Tongue protrusion intact.  Motor Strength - The patient's strength was normal in all extremities and pronator drift was absent.  Bulk was normal and fasciculations were absent.   Motor Tone - Muscle tone was assessed at the neck and appendages and was normal.  Reflexes - The patient's reflexes were normal in all extremities and she had no pathological reflexes.  Sensory - Light touch, temperature/pinprick, vibration and proprioception, and Romberg testing were assessed and were normal.    Coordination - The patient had normal movements in the hands and feet with no ataxia or dysmetria.  Tremor was absent.  Gait and Station - The patient's transfers, posture, gait, station, and turns were observed as normal.  Data reviewed: I personally reviewed the images and agree with the radiology interpretations.  01/23/13 CT head 1. Early subacute left parietal lobe infarct.  2. No evidence of intracranial hemorrhage, mass effect, or midline shift.  3. Chronic small vessel disease and old deep white matter infarcts are seen  bilaterally   01/24/14 CTA head and neck 1. No significant stenosis of the cervical vasculature.  2. Mild atherosclerotic irregularity of the left carotid bifurcation without significant stenosis 3. Occluded distal left MCA branch vessel corresponding to the  infarct territory.  4. Moderate small vessel disease.  5. Mild atherosclerotic irregularity within the cavernous carotid  arteries bilaterally without significant stenosis.  TCD bubble study - 04/26/13 No R to L shunt, negative bubble study.  Component     Latest Ref Rng 01/24/2013  PTT Lupus Anticoagulant     28.0 - 43.0 secs 44.3 (H)  PTTLA Confirmation     <8.0 secs 11.5 (H)  PTTLA 4:1 Mix     28.0 - 43.0 secs 45.6 (H)  DRVVT     <42.9 secs  38.8  Drvvt confirmation     <1.11 Ratio NOT APPL  dRVVT Incubated 1:1 Mix     <42.9 secs NOT APPL  Lupus Anticoagulant     NOT DETECTED DETECTED (A)  Cholesterol     0 - 200 mg/dL 128  Triglycerides     <150 mg/dL 108  HDL     >39 mg/dL 41  Total CHOL/HDL Ratio      3.1  VLDL     0 - 40 mg/dL 22  LDL (calc)     0 - 99 mg/dL 65  Anticardiolipin IgG     <23 GPL U/mL 4 (L)  Anticardiolipin IgM     <11 MPL U/mL 3 (L)  Anticardiolipin IgA     <22 APL U/mL 7 (L)  Hemoglobin A1C     <5.7 % 6.1 (H)  Mean Plasma Glucose     <117 mg/dL 128 (H)  TSH     0.350 - 4.500 uIU/mL 1.003  C3 Complement     90 - 180 mg/dL 160  Complement C4, Body Fluid     10 - 40 mg/dL 34  Compl, Total (CH50)     31 - 60 U/mL >60 (H)  ANA     NEGATIVE NEGATIVE  Sed Rate     0 - 22 mm/hr 26 (H)  RPR     NON REACTIVE NON REACTIVE  HIV     NON REACTIVE NON REACTIVE  AntiThromb III Func     75 - 120 % 107  Protein C Activity     75 - 133 % 153 (H)  Protein C, Total     72 - 160 % 88  Protein S Activity     69 - 129 % 55 (L)  Protein S Ag, Total     60 - 150 % 66  Homocysteine     4.0 - 15.4 umol/L 8.1    Assessment: As you may recall, she is a 48 y.o. African American female with PMH of HTN and obesity was admitted on 01/2013 for left MCA stroke. Head CT showed bilateral old WM infarcts too. She was found to have DVT and put on Xarelto which was stopped after 3 months. Hypercoagulable work up questions for antiphospholipid antibody syndrome.   According to her history, young, female, with multiple arterial and venous thrombosis with questionable anticoagulant test, highly suspicious for antiphospholipid antibody syndrome. If that confirmed, she needs to be on coumadin with INR 2.5-3.5. We will repeat the hypercoagulable work up and will do TCD emboli detection as well as EEG to rule out seizure since the left parietal infarct is superficial and she has confusion episode. Put her on full dose ASA  and low dose statin for now.  Plan:  - ASA 325 and lipitor $RemoveBef'10mg'GGSujQJESK$  for stroke prevention for now - hypercoagulable work up - TCD emboli detection - EEG to rule out seizure - weight loss needed - continue to follow up with cardiology for the loop recorder monitoring - Please follow up with your primary care physician for stroke risk factor modification. - RTC in one month   Orders Placed This Encounter  Procedures  . [place] TCD WITHMONITORING    Standing Status: Future     Number of Occurrences:      Standing Expiration Date: 04/09/2015    Order Specific Question:  Reason for Exam (SYMPTOM  OR DIAGNOSIS REQUIRED)    Answer:  storke    Order Specific Question:  Preferred imaging location?    Answer:  Internal  . [place] TCD WITH BUBBLES    Standing Status: Future     Number of Occurrences:      Standing Expiration Date: 04/09/2015    Order Specific Question:  Reason for Exam (SYMPTOM  OR DIAGNOSIS REQUIRED)    Answer:  embolic stroke    Order Specific Question:  Preferred imaging location?    Answer:  Internal  . ANA  . Hemoglobin A1c  . Hepatic function panel  . Lupus Anticoagulant Panel  . Sickle cell screen  . Antineutrophil Cytoplasmic Ab  . Systemic Lupus Profile A  . Cardiolipin antibodies, IgM+IgG  . Beta-2 glycoprotein antibodies  . Homocysteine  . Protein C activity  . Protein S activity  . EEG adult    Standing Status: Future     Number of Occurrences:      Standing Expiration Date: 10/08/2015    Meds ordered this encounter  Medications  . tacrolimus (PROTOPIC) 0.1 % ointment    Sig: Apply topically 2 (two) times daily.   Marland Kitchen triamcinolone cream (KENALOG) 0.1 %    Sig: Apply 1 application topically as needed.   Marland Kitchen aspirin EC 325 MG tablet    Sig: Take 1 tablet (325 mg total) by mouth daily.    Dispense:  90 tablet    Refill:  3  . atorvastatin (LIPITOR) 10 MG tablet    Sig: Take 1 tablet (10 mg total) by mouth daily.    Dispense:  90 tablet    Refill:  3     Patient Instructions  - I will prescribe you ASA $Remov'325mg'EYaWKt$  for stroke prevention - will prescribe low dose lipitor $RemoveBefo'10mg'YbZJLkeSwYG$  dialy for stroke prevention - repeat hypercoagulabel work up  - do TCD with bubble study - do EEG to rule out seizure - follow up with PCP for stroke risk factor modiftication - do exercise to lose weight - follow up in clinic in one month.   Rosalin Hawking, MD PhD [facility] [address] [place], [place] [address] [phone]

## 2014-10-07 NOTE — Patient Instructions (Signed)
-   I will prescribe you ASA 325mg  for stroke prevention - will prescribe low dose lipitor 10mg  dialy for stroke prevention - repeat hypercoagulabel work up  - do TCD with bubble study - do EEG to rule out seizure - follow up with PCP for stroke risk factor modiftication - do exercise to lose weight - follow up in clinic in one month.

## 2014-10-08 ENCOUNTER — Other Ambulatory Visit: Payer: BC Managed Care – PPO

## 2014-10-08 ENCOUNTER — Encounter (INDEPENDENT_AMBULATORY_CARE_PROVIDER_SITE_OTHER): Payer: Self-pay

## 2014-10-08 DIAGNOSIS — I639 Cerebral infarction, unspecified: Secondary | ICD-10-CM | POA: Insufficient documentation

## 2014-10-08 DIAGNOSIS — I10 Essential (primary) hypertension: Secondary | ICD-10-CM | POA: Insufficient documentation

## 2014-10-08 DIAGNOSIS — E669 Obesity, unspecified: Secondary | ICD-10-CM | POA: Insufficient documentation

## 2014-10-10 ENCOUNTER — Other Ambulatory Visit: Payer: BC Managed Care – PPO

## 2014-10-14 ENCOUNTER — Ambulatory Visit (INDEPENDENT_AMBULATORY_CARE_PROVIDER_SITE_OTHER): Payer: Self-pay

## 2014-10-14 ENCOUNTER — Encounter: Payer: Self-pay | Admitting: Neurology

## 2014-10-14 DIAGNOSIS — Z0289 Encounter for other administrative examinations: Secondary | ICD-10-CM

## 2014-10-14 DIAGNOSIS — I63412 Cerebral infarction due to embolism of left middle cerebral artery: Secondary | ICD-10-CM

## 2014-10-16 LAB — LUPUS ANTICOAGULANT PANEL
APTT 1 1 NP: 33.7 s
APTT 1 1 SALINE: 50.7 s
APTT: 40.1 s — AB
AVAILABLE: 13.7 s
DRVVT SCREEN SECONDS: 42.6 s
Hexagonal Phospholipid Neutral: 9 s — ABNORMAL HIGH
INR: 1.1 ratio
PLATELET NEUTRALIZATION: 5.7 s — AB
Thrombin Time: 16.7 s

## 2014-10-16 LAB — CARDIOLIPIN ANTIBODIES, IGM+IGG
Anticardiolipin IgG: <9 GPL U/mL (ref 0–14)
Anticardiolipin IgM: <9 MPL U/mL (ref 0–12)

## 2014-10-16 LAB — BETA-2 GLYCOPROTEIN ANTIBODIES
BETA 2 GLYCO 1 IGA: 31 GPI IgA units — AB (ref 0–25)
Beta-2 Glyco I IgG: <9 GPI IgG units (ref 0–20)

## 2014-10-16 LAB — HEPATIC FUNCTION PANEL
ALT: 16 IU/L (ref 0–32)
AST: 17 IU/L (ref 0–40)
Albumin: 3.9 g/dL (ref 3.5–5.5)
Alkaline Phosphatase: 71 IU/L (ref 39–117)
BILIRUBIN TOTAL: 0.3 mg/dL (ref 0.0–1.2)
Bilirubin, Direct: 0.1 mg/dL (ref 0.00–0.40)
TOTAL PROTEIN: 7.9 g/dL (ref 6.0–8.5)

## 2014-10-16 LAB — SYSTEMIC LUPUS PROFILE A
Chromatin Ab SerPl-aCnc: <0.2 AI (ref 0.0–0.9)
ENA RNP AB: 0.3 AI (ref 0.0–0.9)
ENA SM Ab Ser-aCnc: <0.2 AI (ref 0.0–0.9)
ENA SSA (RO) Ab: <0.2 AI (ref 0.0–0.9)
ENA SSB (LA) Ab: <0.2 AI (ref 0.0–0.9)
RHEUMATOID FACTOR: 11 [IU]/mL (ref 0.0–13.9)
dsDNA Ab: 1 IU/mL (ref 0–9)

## 2014-10-16 LAB — HEMOGLOBIN A1C
Est. average glucose Bld gHb Est-mCnc: 134 mg/dL
HEMOGLOBIN A1C: 6.3 % — AB (ref 4.8–5.6)

## 2014-10-16 LAB — ANA: Anti Nuclear Antibody(ANA): NEGATIVE

## 2014-10-16 LAB — ANTINEUTROPHIL CYTOPLASMIC AB: C-ANCA: <1:20 {titer}

## 2014-10-16 LAB — PROTEIN S ACTIVITY: Protein S Activity: 66 % (ref 60–145)

## 2014-10-16 LAB — PROTEIN C ACTIVITY: PROTEIN C ACTIVITY: 144 % (ref 74–151)

## 2014-10-16 LAB — SICKLE CELL SCREEN: Sickle Cell Prep: POSITIVE — AB

## 2014-10-16 LAB — HOMOCYSTEINE: Homocysteine: 9.9 umol/L (ref 0.0–15.0)

## 2014-10-17 ENCOUNTER — Other Ambulatory Visit: Payer: Self-pay | Admitting: Internal Medicine

## 2014-10-17 DIAGNOSIS — R760 Raised antibody titer: Secondary | ICD-10-CM

## 2014-10-17 DIAGNOSIS — Z8673 Personal history of transient ischemic attack (TIA), and cerebral infarction without residual deficits: Secondary | ICD-10-CM

## 2014-10-17 NOTE — Progress Notes (Signed)
Quick Note:  Hello, Dr. Annitta Needs:  I saw this pt in my clinic for her previous strokes. As you may recall, she is a 48 y.o. African American female with PMH of HTN and obesity was admitted on 01/2013 for left MCA stroke. Head CT showed bilateral old white matter infarcts too. She was found to have DVT and put on Xarelto which was stopped after 3 months. Hypercoagulable work up questions for positive lupus anticoagulant. I repeated hypercoagulable work up above and it showed again positive for lupus anticoagulant (hexagonal phospholipid natural) and also positive sickle cell screening. I am highly suspicious that she has lupus anticoagulant to explain her recurrent strokes and DVT. I would recommend a urgent hematology referral regarding the positive lupus anticoagulant and sickle cell screening. She may need coumadin for stroke prevention if confirmed.  I have talked with pt over the phone about the results and she is agreeable to be referred to a hematologist for further work up and treatment. Thanks a lot for your help.  Rosalin Hawking, MD PhD Stroke Neurology 10/17/2014 12:00 PM   ______

## 2014-10-21 ENCOUNTER — Telehealth: Payer: Self-pay | Admitting: Hematology and Oncology

## 2014-10-21 ENCOUNTER — Ambulatory Visit (INDEPENDENT_AMBULATORY_CARE_PROVIDER_SITE_OTHER): Payer: BC Managed Care – PPO | Admitting: *Deleted

## 2014-10-21 DIAGNOSIS — I639 Cerebral infarction, unspecified: Secondary | ICD-10-CM

## 2014-10-21 NOTE — Telephone Encounter (Signed)
S/W PATIENT AND GAVE NP APPT FOR 11/17 @ 2 W/DR. [place]

## 2014-10-21 NOTE — Telephone Encounter (Signed)
left message and gave np appt for 11/17 @ 2 w/Dr. Alvy Bimler. Contact information left for patient to return call.

## 2014-10-22 ENCOUNTER — Ambulatory Visit (INDEPENDENT_AMBULATORY_CARE_PROVIDER_SITE_OTHER): Payer: BC Managed Care – PPO | Admitting: Radiology

## 2014-10-22 DIAGNOSIS — F05 Delirium due to known physiological condition: Secondary | ICD-10-CM

## 2014-10-22 NOTE — Procedures (Signed)
      Mary [name] is a 48 year old patient with a history of a left parietal stroke that occurred in February 2014. The patient has had some episodes of confusion, difficulty with speech and communication. The patient is being evaluated for these events.  This is a routine EEG. No skull defects are noted. Medications include amlodipine, aspirin, Lipitor, Catapres, metoprolol, and Kenalog cream.  EEG classification: Dysrhythmia grade 1 left temporal  Description of the recording: The background rhythms of this recording consists of a relatively well modulated medium amplitude alpha rhythm of 9 Hz that is reactive to eye opening and closure. As the record progresses, the patient appears to remain in the waking state throughout the recording. Excessive electrode artifact at C3 is seen throughout the recording. Photic stimulation is performed early in the recording, and this results in a bilateral and symmetric photic driving response. Hyperventilation was not performed. Intermittently during the recording, a 5-7 Hz slowing was seen emanating from the left temporal region. At no time during the recording does there appear to be evidence of actual spike or spike wave discharges. EKG monitor shows no evidence of cardiac rhythm abnormalities with a heart rate of 60.  Impression: This is an abnormal EEG recording secondary to intermittent theta slowing emanating from the left temporal region. This slowing may correlate with the prior stroke event, and no clear epileptiform discharges were seen during the study. Clinical correlation is required.

## 2014-10-28 LAB — MDC_IDC_ENUM_SESS_TYPE_REMOTE

## 2014-10-28 NOTE — Progress Notes (Signed)
Loop recorder 

## 2014-11-04 ENCOUNTER — Ambulatory Visit: Payer: BC Managed Care – PPO | Admitting: Hematology and Oncology

## 2014-11-04 ENCOUNTER — Ambulatory Visit: Payer: BC Managed Care – PPO

## 2014-11-06 ENCOUNTER — Encounter: Payer: Self-pay | Admitting: Internal Medicine

## 2014-11-07 ENCOUNTER — Telehealth: Payer: Self-pay | Admitting: Neurology

## 2014-11-07 NOTE — Telephone Encounter (Signed)
Patient requesting EEG results, please return call and advise.

## 2014-11-07 NOTE — Telephone Encounter (Signed)
Informed patient that EEG had no signs of seizure activity, and no change in treatment, patient requesting a copy of EEG, call was forwarded to medical records.

## 2014-11-17 ENCOUNTER — Encounter: Payer: Self-pay | Admitting: Neurology

## 2014-11-17 ENCOUNTER — Ambulatory Visit (INDEPENDENT_AMBULATORY_CARE_PROVIDER_SITE_OTHER): Payer: BC Managed Care – PPO | Admitting: Neurology

## 2014-11-17 VITALS — BP 143/89 | HR 68 | Ht 65.0 in

## 2014-11-17 DIAGNOSIS — E669 Obesity, unspecified: Secondary | ICD-10-CM

## 2014-11-17 DIAGNOSIS — IMO0002 Reserved for concepts with insufficient information to code with codable children: Secondary | ICD-10-CM

## 2014-11-17 DIAGNOSIS — I69320 Aphasia following cerebral infarction: Secondary | ICD-10-CM

## 2014-11-17 DIAGNOSIS — I63412 Cerebral infarction due to embolism of left middle cerebral artery: Secondary | ICD-10-CM

## 2014-11-17 DIAGNOSIS — D6861 Antiphospholipid syndrome: Secondary | ICD-10-CM

## 2014-11-17 DIAGNOSIS — I10 Essential (primary) hypertension: Secondary | ICD-10-CM

## 2014-11-17 MED ORDER — WARFARIN SODIUM 5 MG PO TABS: 5.0000 mg | ORAL_TABLET | Freq: Every day | ORAL | Status: DC

## 2014-11-17 NOTE — Progress Notes (Signed)
STROKE NEUROLOGY FOLLOW UP NOTE  NAME: Mary [name] DOB: 07-19-1966  REASON FOR VISIT: stroke follow up HISTORY FROM: pt and chart  Today we had the pleasure of seeing Mary [name] in follow-up at our Neurology Clinic. Pt was accompanied by no one.   History Summary Mary [name] is a 48 year old lady with PMH of HTN and obesity was admitted for stroke on 01/23/13. She presented with several days history of slurred speech and expressive aphasia. Her symptoms actually began after an ovarian biopsy and she initially came to the ER and was discharged home but symptoms persisted and a few days later she returned and CT scan of the head this time showed a subacute left parietal MCA branch infarct. CT angiogram of the brain showed occluded distal branch of left middle cerebral artery corresponding to the infarct territory. CT angiogram of the neck showed no significant extracranial stenosis. MRI of the brain could not be obtained due to patient's obesity as well as severe claustrophobia. Transthoracic echo showed normal ejection fraction. Transesophageal echocardiogram showed no patent foramen ovale, vegetation or clot. EKG and telemetry monitoring did not reveal and cardiac arrhythmias. ESR,complements. ANA panel, RPR, HIV, antithrombin III and homocystine were all normal. The patient had significant expressive aphasia and was seen by physical, occupational and speech therapy and who was found to have deep vein thrombosis and hence was started on anticoagulation with xarelto. She was discharged and has continued to have outpatient speech therapy and has obtained some modest improvement in her speech. She still has significant word hesitancy but is able to communicate. She complains of mild headaches every other day for which she takes Tylenol which seemed to work quite well. She works as a Freight forwarder at [employer] and states she will be unable to perform her job in her current situation.   Follow up  04/15/13 - She reports no significant change in her expressive aphasia. She has no new complaints today. She still not working and has questions about when can she drive. TCD bubble study was negative.   Follow up 10/14/13 (LL): she comes back for stroke revisit. She is doing well, PCP changed her from Norvasc to Metoprolol because she was having frequent headaches, which has helped. She stopped Nortriptyline due to lethargy and it was not helping her headaches. She may need to restart Norvasc though, because BP is elevated today, 162/98. She is still in speech therapy .   Follow up 04/18/14 (LL): Since last visit, she returned to working part-time at [employer] in an different job role. She is very frustrated that she is not able to function in her previous capacity. She continues with ST twice weekly. Her blood pressure is better controlled on Norvasc and Metoprolol, though elevated in the office today at 155/93. She states her headaches are less frequent.   Follow up 10/07/14 - the patient has been doing OK. She is on norvac and metoprolol for BP control and today is 123/80. However, she stated that last Friday, she was working in [place], she felt funny feeling, not able to do what she was able to do before, she kept asking her co-workers, how to do this, how to do that, she has some difficulty understanding and express herself. She was thinking and talking slow, she felt not herself. On Saturday, she felt fine. She has not back to work yet. She went to her PCP and was told to be off work for a week and follow up  with neurology.   She was started on Xarelto for DVT in 01/2013 but stopped after 3 months. Suppose on ASA 76m as per chart, but currently she is not on any antiplatelet. She also had loop recorder placed in 05/2014 by Dr. ARayann Hemanand so far no Afib episodes found. However, her hypercoagulable work up initially showed positive lupus coagulant, no repeat done yet.  Interval History During the  interval time, she had EEG which did not show seizure like activity but left temporal slowing which is consistent with her stroke in the past. Her repeat lab test showed positive sickle cell screening and again positive lupus anticoagulant panel with high hexagonal phospholipid Neutral. She has not seen hematology yet. Her loop recorder so far is negative for AF episodes. She stated that she continued to have intermittent episodes with word finding difficulties, difficulty with comprehension and confusion at work, especially when she feels tired and stressed out, but she stated that she gradually is getting better than before. BP today 143/89.  REVIEW OF SYSTEMS: Full 14 system review of systems performed and notable only for those listed below and in HPI above, all others are negative:  Constitutional: weight gain, fatigue  Cardiovascular: N/A  Ear/Nose/Throat: N/A  Skin: N/A  Eyes: N/A  Respiratory: N/A  Gastroitestinal: N/A  Genitourinary: N/A Hematology/Lymphatic: N/A  Endocrine: N/A  Musculoskeletal: N/A  Allergy/Immunology: N/A  Neurological: confusion, language deficit Psychiatric: N/A  The following represents the patient's updated allergies and side effects list: No Known Allergies  Labs since last visit of relevance include the following:         Result Value Ref Range   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 Battery status OK. Normal device function. No symptom episodes, tachy episodes, brady or pause episodes. No AF episodes. Monthly summary reports and PRN with JA.    The neurologically relevant items on the patient's problem list were reviewed on today's visit.  Neurologic Examination  A problem focused neurological exam (12 or more points of the single system neurologic examination, vital signs  counts as 1 point, cranial nerves count for 8 points) was performed.  Blood pressure 143/89, pulse 68, height 5' 5" (1.651 m).  General - Well nourished, well developed, in no apparent distress.  Ophthalmologic - not able to see through  Cardiovascular - Regular rate and rhythm with no murmur.  Mental Status -  Level of arousal and orientation to time, place, and person were intact. Language including expression, naming, repetition, comprehension was assessed and found intact.  Cranial Nerves II - XII - II - Visual field intact OU. III, IV, VI - Extraocular movements intact. V - Facial sensation intact bilaterally. VII - Facial movement intact bilaterally. VIII - Hearing & vestibular intact bilaterally. X - Palate elevates symmetrically. XI - Chin turning & shoulder shrug intact bilaterally. XII - Tongue protrusion intact.  Motor Strength - The patient's strength was normal in all extremities and pronator drift was absent.  Bulk was normal and fasciculations were absent.   Motor Tone - Muscle tone was assessed at the neck and appendages and was normal.  Reflexes - The patient's reflexes were normal in all extremities and she had no pathological reflexes.  Sensory - Light touch, temperature/pinprick, vibration and proprioception,  and Romberg testing were assessed and were normal.    Coordination - The patient had normal movements in the hands and feet with no ataxia or dysmetria.  Tremor was absent.  Gait and Station - The patient's transfers, posture, gait, station, and turns were observed as normal.  Data reviewed: I personally reviewed the images and agree with the radiology interpretations.  01/23/13 CT head 1. Early subacute left parietal lobe infarct.  2. No evidence of intracranial hemorrhage, mass effect, or midline shift.  3. Chronic small vessel disease and old deep white matter infarcts are seen bilaterally   01/24/14 CTA head and neck 1. No significant stenosis of  the cervical vasculature.  2. Mild atherosclerotic irregularity of the left carotid bifurcation without significant stenosis 3. Occluded distal left MCA branch vessel corresponding to the  infarct territory.  4. Moderate small vessel disease.  5. Mild atherosclerotic irregularity within the cavernous carotid  arteries bilaterally without significant stenosis.  TCD bubble study - 04/26/13 No R to L shunt, negative bubble study.  EEG 10/22/14 - This is an abnormal EEG recording secondary to intermittent theta slowing emanating from the left temporal region. This slowing may correlate with the prior stroke event, and no clear epileptiform discharges were seen during the study. Clinical correlation is required.  Component     Latest Ref Rng 01/24/2013 05/30/2014 10/07/2014  Prothrombin Time        13.7  INR        1.1  APTT        40.1 (H)  APTT 1:1 NP        33.7  APTT 1:1 Saline        50.7  THROMBIN TIME        16.7  DRVVT Screen Seconds        42.6  DRVVT Confirm Seconds        CANCELED  DRVVT Ratio        CANCELED  Hexagonal Phospholipid Neutral        9 (H)  Platelet Neutralization        5.7 (H)  Anticardiolipin Ab, IgG        CANCELED  Anticardiolipin Ab, IgM        CANCELED  Beta-2 Glycoprotein I, IgG        CANCELED  Beta-2 Glycoprotein I, IgM        CANCELED  Beta-2 Glycoprotein I, IgA        CANCELED  LAC Interpretation        Comment  PTT Lupus Anticoagulant     28.0 - 43.0 secs 44.3 (H)    PTTLA Confirmation     <8.0 secs 11.5 (H)    PTTLA 4:1 Mix     28.0 - 43.0 secs 45.6 (H)    DRVVT     <42.9 secs 38.8    Drvvt confirmation     <1.11 Ratio NOT APPL    dRVVT Incubated 1:1 Mix     <42.9 secs NOT APPL    Lupus Anticoagulant     NOT DETECTED DETECTED (A)    Total Protein     6.0 - 8.5 g/dL   7.9  Albumin     3.5 - 5.5 g/dL   3.9  Total Bilirubin     0.0 - 1.2 mg/dL   0.3  Bilirubin, Direct     0.00 - 0.40 mg/dL   0.10  Alkaline Phosphatase      39 - 117 IU/L  71  AST     0 - 40 IU/L   17  ALT     0 - 32 IU/L   16  ENA RNP Ab     0.0 - 0.9 AI   0.3  ENA SM Ab Ser-aCnc     0.0 - 0.9 AI   <0.2  Rhuematoid fact SerPl-aCnc     0.0 - 13.9 IU/mL   11.0  Chromatin Ab SerPl-aCnc     0.0 - 0.9 AI   <0.2  ENA SSA (RO) Ab     0.0 - 0.9 AI   <0.2  ENA SSB (LA) Ab     0.0 - 0.9 AI   <0.2  dsDNA Ab     0 - 9 IU/mL   1  Cholesterol     0 - 200 mg/dL  126   Triglycerides     <150 mg/dL  91   HDL     >39 mg/dL  40   Total CHOL/HDL Ratio       3.2   VLDL     0 - 40 mg/dL  18   LDL (calc)     0 - 99 mg/dL  68   Anticardiolipin IgG     0 - 14 GPL U/mL 4 (L)  <9  Anticardiolipin IgM     0 - 12 MPL U/mL 3 (L)  <9  Anticardiolipin IgA     <22 APL U/mL 7 (L)    C-ANCA     Neg:<1:20 titer   <1:20  P-ANCA     Neg:<1:20 titer   <1:20  Atypical pANCA     Neg:<1:20 titer   <1:20  Beta-2 Glyco I IgG     0 - 20 GPI IgG units   <9  Beta-2 Glyco 1 IgA     0 - 25 GPI IgA units   31 (H)  Beta-2 Glyco 1 IgM     0 - 32 GPI IgM units   <9  Hgb A1c MFr Bld     4.8 - 5.6 %   6.3 (H)  Est. average glucose Bld gHb Est-mCnc        134  C3 Complement     90 - 180 mg/dL 160    Complement C4, Body Fluid     10 - 40 mg/dL 34    ANA Ser Ql     Negative NEGATIVE  Negative  RPR     NON REACTIVE NON REACTIVE    HIV     NON REACTIVE NON REACTIVE    AntiThromb III Func     75 - 120 % 107    Protein C Activity     74 - 151 % 153 (H)  144  Protein C, Total     72 - 160 % 88    Protein S Activity     60 - 145 % 55 (L)  66  Protein S Ag, Total     60 - 150 % 66    Homocysteine     4.0 - 15.4 umol/L 8.1    TSH     0.350 - 4.500 uIU/mL  1.903   Sickle Cell Prep     Negative   Positive (A)  Homocysteine     0.0 - 15.0 umol/L   9.9     Assessment: As you may recall, she is a 48 y.o. African American female with PMH of HTN and obesity was admitted on 01/2013 for left  MCA stroke. Head CT showed bilateral old WM infarcts too. She was  found to have DVT and put on Xarelto which was stopped after 3 months. Hypercoagulable work up questions for antiphospholipid antibody syndrome. Repeat hypercoagulable work up again showed positive for lupus anticoagulant panel as well as positive sickle cell screening.  According to her history, young, female, with multiple arterial and venous thrombosis with positive anticoagulant tests x 2, antiphospholipid antibody syndrome is considered until proven otherwise.She needs to be on coumadin with INR 2.5-3.5. Will prescribe her with coumadin today and she will follow up with PCP for INR monitoring. Once INR 2.5-3.5, her ASA should be discontinued. Will need to refer to hematologist for further recommendations about positive sickle cell screening and antiphospholipid syndrome.  Her intermittent confusion and language deficit while at work most likely the recrudescence of her old left temporal parietal stroke under the condition of stress, anxiety or infection, which will make her stroke symptoms re-emerge. She is advised to take it easy at work and take part time work. Pt expressed understanding.  Plan:  - coumadin for APL syndrome - INR goal 2.5-3.5. Follow up with PCP for INR monitoring. Once INR at goal, stop ASA - continue lipitor for stroke prevention - Follow up with your primary care physician for stroke risk factor modification. Recommend maintain blood pressure goal <130/80, diabetes with hemoglobin A1c goal below 6.5% and lipids with LDL cholesterol goal below 70 mg/dL.  - pt needs hematology referral for positive sickle cell screening and APL syndrome. Dr. Annitta Needs made aware - take easy at work to avoid recrudescence of stroke symptoms. - regular exercise and weight loss - RTC in 2 months.  No orders of the defined types were placed in this encounter.    Meds ordered this encounter  Medications  . warfarin (COUMADIN) 5 MG tablet    Sig: Take 1 tablet (5 mg total) by mouth daily.     Dispense:  30 tablet    Refill:  3    Patient Instructions  - will prescribe you coumadin for treatment of antiphospholipid syndrome and stroke prevention - please contact your PCP Dr. Annitta Needs for INR checking while on coumadin - INR goal is 2.5-3.5, and once INR at goal please stop ASA - continue lipitor for stroke prevention - please contact Dr. Annitta Needs to refer you to a hematologist for further recommendations on positive sickle cell screening and antiphospholipid syndrome. I will also forwared my note to Dr. Annitta Needs - Follow up with your primary care physician for stroke risk factor modification. Recommend maintain blood pressure goal <130/80, diabetes with hemoglobin A1c goal below 6.5% and lipids with LDL cholesterol goal below 70 mg/dL.  - you need to take easy at work, any stress anxiety or infection may affect your brain function and cause the stroke symptoms re-emerge. - regular exercise and weight loss - follow up in 2 months.    Rosalin Hawking, MD PhD [facility] [address] [place], [place] [address] [phone]

## 2014-11-17 NOTE — Patient Instructions (Signed)
-   will prescribe you coumadin for treatment of antiphospholipid syndrome and stroke prevention - please contact your PCP Dr. Annitta Needs for INR checking while on coumadin - INR goal is 2.5-3.5, and once INR at goal please stop ASA - continue lipitor for stroke prevention - please contact Dr. Annitta Needs to refer you to a hematologist for further recommendations on positive sickle cell screening and antiphospholipid syndrome. I will also forwared my note to Dr. Annitta Needs - Follow up with your primary care physician for stroke risk factor modification. Recommend maintain blood pressure goal <130/80, diabetes with hemoglobin A1c goal below 6.5% and lipids with LDL cholesterol goal below 70 mg/dL.  - you need to take easy at work, any stress anxiety or infection may affect your brain function and cause the stroke symptoms re-emerge. - regular exercise and weight loss - follow up in 2 months.

## 2014-11-24 ENCOUNTER — Encounter: Payer: Self-pay | Admitting: Internal Medicine

## 2014-11-24 ENCOUNTER — Ambulatory Visit: Payer: BC Managed Care – PPO | Attending: Internal Medicine | Admitting: Internal Medicine

## 2014-11-24 ENCOUNTER — Ambulatory Visit (INDEPENDENT_AMBULATORY_CARE_PROVIDER_SITE_OTHER): Payer: BC Managed Care – PPO | Admitting: *Deleted

## 2014-11-24 VITALS — BP 138/85 | HR 82 | Temp 98.0°F | Resp 16 | Wt 301.0 lb

## 2014-11-24 DIAGNOSIS — Z7982 Long term (current) use of aspirin: Secondary | ICD-10-CM | POA: Insufficient documentation

## 2014-11-24 DIAGNOSIS — I668 Occlusion and stenosis of other cerebral arteries: Secondary | ICD-10-CM | POA: Diagnosis not present

## 2014-11-24 DIAGNOSIS — Z8673 Personal history of transient ischemic attack (TIA), and cerebral infarction without residual deficits: Secondary | ICD-10-CM | POA: Insufficient documentation

## 2014-11-24 DIAGNOSIS — Z7901 Long term (current) use of anticoagulants: Secondary | ICD-10-CM | POA: Diagnosis not present

## 2014-11-24 DIAGNOSIS — Z79899 Other long term (current) drug therapy: Secondary | ICD-10-CM | POA: Diagnosis not present

## 2014-11-24 DIAGNOSIS — Z09 Encounter for follow-up examination after completed treatment for conditions other than malignant neoplasm: Secondary | ICD-10-CM | POA: Insufficient documentation

## 2014-11-24 DIAGNOSIS — I10 Essential (primary) hypertension: Secondary | ICD-10-CM | POA: Insufficient documentation

## 2014-11-24 DIAGNOSIS — D6862 Lupus anticoagulant syndrome: Secondary | ICD-10-CM | POA: Insufficient documentation

## 2014-11-24 DIAGNOSIS — I669 Occlusion and stenosis of unspecified cerebral artery: Secondary | ICD-10-CM

## 2014-11-24 DIAGNOSIS — R760 Raised antibody titer: Secondary | ICD-10-CM

## 2014-11-24 DIAGNOSIS — N8320 Unspecified ovarian cysts: Secondary | ICD-10-CM | POA: Diagnosis not present

## 2014-11-24 DIAGNOSIS — R790 Abnormal level of blood mineral: Secondary | ICD-10-CM

## 2014-11-24 DIAGNOSIS — I639 Cerebral infarction, unspecified: Secondary | ICD-10-CM

## 2014-11-24 LAB — MDC_IDC_ENUM_SESS_TYPE_REMOTE

## 2014-11-24 LAB — POCT INR: INR: 1.2

## 2014-11-24 NOTE — Patient Instructions (Addendum)
DASH Eating Plan DASH stands for "Dietary Approaches to Stop Hypertension." The DASH eating plan is a healthy eating plan that has been shown to reduce high blood pressure (hypertension). Additional health benefits may include reducing the risk of type 2 diabetes mellitus, heart disease, and stroke. The DASH eating plan may also help with weight loss. WHAT DO I NEED TO KNOW ABOUT THE DASH EATING PLAN? For the DASH eating plan, you will follow these general guidelines:  Choose foods with a percent daily value for sodium of less than 5% (as listed on the food label).  Use salt-free seasonings or herbs instead of table salt or sea salt.  Check with your health care provider or pharmacist before using salt substitutes.  Eat lower-sodium products, often labeled as "lower sodium" or "no salt added."  Eat fresh foods.  Eat more vegetables, fruits, and low-fat dairy products.  Choose whole grains. Look for the word "whole" as the first word in the ingredient list.  Choose fish and skinless chicken or turkey more often than red meat. Limit fish, poultry, and meat to 6 oz (170 g) each day.  Limit sweets, desserts, sugars, and sugary drinks.  Choose heart-healthy fats.  Limit cheese to 1 oz (28 g) per day.  Eat more home-cooked food and less restaurant, buffet, and fast food.  Limit fried foods.  Cook foods using methods other than frying.  Limit canned vegetables. If you do use them, rinse them well to decrease the sodium.  When eating at a restaurant, ask that your food be prepared with less salt, or no salt if possible. WHAT FOODS CAN I EAT? Seek help from a dietitian for individual calorie needs. Grains Whole grain or whole wheat bread. Brown rice. Whole grain or whole wheat pasta. Quinoa, bulgur, and whole grain cereals. Low-sodium cereals. Corn or whole wheat flour tortillas. Whole grain cornbread. Whole grain crackers. Low-sodium crackers. Vegetables Fresh or frozen vegetables  (raw, steamed, roasted, or grilled). Low-sodium or reduced-sodium tomato and vegetable juices. Low-sodium or reduced-sodium tomato sauce and paste. Low-sodium or reduced-sodium canned vegetables.  Fruits All fresh, canned (in natural juice), or frozen fruits. Meat and Other Protein Products Ground beef (85% or leaner), grass-fed beef, or beef trimmed of fat. Skinless chicken or turkey. Ground chicken or turkey. Pork trimmed of fat. All fish and seafood. Eggs. Dried beans, peas, or lentils. Unsalted nuts and seeds. Unsalted canned beans. Dairy Low-fat dairy products, such as skim or 1% milk, 2% or reduced-fat cheeses, low-fat ricotta or cottage cheese, or plain low-fat yogurt. Low-sodium or reduced-sodium cheeses. Fats and Oils Tub margarines without trans fats. Light or reduced-fat mayonnaise and salad dressings (reduced sodium). Avocado. Safflower, olive, or canola oils. Natural peanut or almond butter. Other Unsalted popcorn and pretzels. The items listed above may not be a complete list of recommended foods or beverages. Contact your dietitian for more options. WHAT FOODS ARE NOT RECOMMENDED? Grains White bread. White pasta. White rice. Refined cornbread. Bagels and croissants. Crackers that contain trans fat. Vegetables Creamed or fried vegetables. Vegetables in a cheese sauce. Regular canned vegetables. Regular canned tomato sauce and paste. Regular tomato and vegetable juices. Fruits Dried fruits. Canned fruit in light or heavy syrup. Fruit juice. Meat and Other Protein Products Fatty cuts of meat. Ribs, chicken wings, bacon, sausage, bologna, salami, chitterlings, fatback, hot dogs, bratwurst, and packaged luncheon meats. Salted nuts and seeds. Canned beans with salt. Dairy Whole or 2% milk, cream, half-and-half, and cream cheese. Whole-fat or sweetened yogurt. Full-fat   cheeses or blue cheese. Nondairy creamers and whipped toppings. Processed cheese, cheese spreads, or cheese  curds. Condiments Onion and garlic salt, seasoned salt, table salt, and sea salt. Canned and packaged gravies. Worcestershire sauce. Tartar sauce. Barbecue sauce. Teriyaki sauce. Soy sauce, including reduced sodium. Steak sauce. Fish sauce. Oyster sauce. Cocktail sauce. Horseradish. Ketchup and mustard. Meat flavorings and tenderizers. Bouillon cubes. Hot sauce. Tabasco sauce. Marinades. Taco seasonings. Relishes. Fats and Oils Butter, stick margarine, lard, shortening, ghee, and bacon fat. Coconut, palm kernel, or palm oils. Regular salad dressings. Other Pickles and olives. Salted popcorn and pretzels. The items listed above may not be a complete list of foods and beverages to avoid. Contact your dietitian for more information. WHERE CAN I FIND MORE INFORMATION? National Heart, Lung, and Blood Institute: travelstabloid.com Document Released: 11/24/2011 Document Revised: 04/21/2014 Document Reviewed: 10/09/2013 [facility] Patient Information 2015 [place], [place]. This information is not intended to replace advice given to you by your health care provider. Make sure you discuss any questions you have with your health care provider. Warfarin tablets What is this medicine? WARFARIN (WAR far in) is an anticoagulant. It is used to treat or prevent clots in the veins, arteries, lungs, or heart. This medicine may be used for other purposes; ask your health care provider or pharmacist if you have questions. COMMON BRAND NAME(S): Coumadin, Jantoven What should I tell my health care provider before I take this medicine? They need to know if you have any of these conditions: -alcoholism -anemia -bleeding disorders -cancer -diabetes -heart disease -high blood pressure -history of bleeding in the gastrointestinal tract -history of stroke or other brain injury or disease -kidney or liver disease -protein C deficiency -protein S deficiency -psychosis or  dementia -recent injury, recent or planned surgery or procedure -an unusual or allergic reaction to warfarin, other medicines, foods, dyes, or preservatives -pregnant or trying to get pregnant -breast-feeding How should I use this medicine? Take this medicine by mouth with a glass of water. Follow the directions on the prescription label. You can take this medicine with or without food. Take your medicine at the same time each day. Do not take it more often than directed. Do not stop taking except on your doctor's advice. Stopping this medicine may increase your risk of a blood clot. Be sure to refill your prescription before you run out of medicine. If your doctor or healthcare professional calls to change your dose, write down the dose and any other instructions. Always read the dose and instructions back to him or her to make sure you understand them. Tell your doctor or healthcare professional what strength of tablets you have on hand. Ask how many tablets you should take to equal your new dose. Write the date on the new instructions and keep them near your medicine. If you are told to stop taking your medicine until your next blood test, call your doctor or healthcare professional if you do not hear anything within 24 hours of the test to find out your new dose or when to restart your prior dose. A special MedGuide will be given to you by the pharmacist with each prescription and refill. Be sure to read this information carefully each time. Talk to your pediatrician regarding the use of this medicine in children. Special care may be needed. Overdosage: If you think you have taken too much of this medicine contact a poison control center or emergency room at once. NOTE: This medicine is only for you. Do not share  this medicine with others. What if I miss a dose? It is important not to miss a dose. If you miss a dose, call your healthcare provider. Take the dose as soon as possible on the same day.  If it is almost time for your next dose, take only that dose. Do not take double or extra doses to make up for a missed dose. What may interact with this medicine? Do not take this medicine with any of the following medications: -agents that prevent or dissolve blood clots -aspirin or other salicylates -danshen -dextrothyroxine -mifepristone -St. John's Wort -red yeast rice This medicine may also interact with the following medications: -acetaminophen -agents that lower cholesterol -alcohol -allopurinol -amiodarone -antibiotics or medicines for treating bacterial, fungal or viral infections -azathioprine -barbiturate medicines for inducing sleep or treating seizures -certain medicines for diabetes -certain medicines for heart rhythm problems -certain medicines for high blood pressure -chloral hydrate -cisapride -disulfiram -female hormones, including contraceptive or birth control pills -general anesthetics -herbal or dietary products like garlic, ginkgo, ginseng, green tea, or kava kava -influenza virus vaccine -female hormones -medicines for mental depression or psychosis -medicines for some types of cancer -medicines for stomach problems -methylphenidate -NSAIDs, medicines for pain and inflammation, like ibuprofen or naproxen -propoxyphene -quinidine, quinine -raloxifene -seizure or epilepsy medicine like carbamazepine, phenytoin, and valproic acid -steroids like cortisone and prednisone -tamoxifen -thyroid medicine -tramadol -vitamin c, vitamin e, and vitamin K -zafirlukast -zileuton This list may not describe all possible interactions. Give your health care provider a list of all the medicines, herbs, non-prescription drugs, or dietary supplements you use. Also tell them if you smoke, drink alcohol, or use illegal drugs. Some items may interact with your medicine. What should I watch for while using this medicine? Visit your doctor or health care professional for  regular checks on your progress. You will need to have a blood test called a PT/INR regularly. The PT/INR blood test is done to make sure you are getting the right dose of this medicine. It is important to not miss your appointment for the blood tests. When you first start taking this medicine, these tests are done often. Once the correct dose is determined and you take your medicine properly, these tests can be done less often. Notify your doctor or health care professional and seek emergency treatment if you develop breathing problems; changes in vision; chest pain; severe, sudden headache; pain, swelling, warmth in the leg; trouble speaking; sudden numbness or weakness of the face, arm or leg. These can be signs that your condition has gotten worse. While you are taking this medicine, carry an identification card with your name, the name and dose of medicine(s) being used, and the name and phone number of your doctor or health care professional or person to contact in an emergency. Do not start taking or stop taking any medicines or over-the-counter medicines except on the advice of your doctor or health care professional. You should discuss your diet with your doctor or health care professional. Do not make major changes in your diet. Vitamin K can affect how well this medicine works. Many foods contain vitamin K. It is important to eat a consistent amount of foods with vitamin K. Other foods with vitamin K that you should eat in consistent amounts are asparagus, basil, beef or pork liver, black eyed peas, broccoli, brussel sprouts, cabbage, chickpeas, cucumber with peel, green onions, green tea, okra, parsley, peas, thyme, and green leafy vegetables like beet greens, collard greens, endive, kale,  mustard greens, spinach, turnip greens, watercress, or certain lettuces like green leaf or romaine. This medicine can cause birth defects or bleeding in an unborn child. Women of childbearing age should use  effective birth control while taking this medicine. If a woman becomes pregnant while taking this medicine, she should discuss the potential risks and her options with her health care professional. Avoid sports and activities that might cause injury while you are using this medicine. Severe falls or injuries can cause unseen bleeding. Be careful when using sharp tools or knives. Consider using an Copy. Take special care brushing or flossing your teeth. Report any injuries, bruising, or red spots on the skin to your doctor or health care professional. If you have an illness that causes vomiting, diarrhea, or fever for more than a few days, contact your doctor. Also check with your doctor if you are unable to eat for several days. These problems can change the effect of this medicine. Even after you stop taking this medicine, it takes several days before your body recovers its normal ability to clot blood. Ask your doctor or health care professional how long you need to be careful. If you are going to have surgery or dental work, tell your doctor or health care professional that you have been taking this medicine. What side effects may I notice from receiving this medicine? Side effects that you should report to your doctor or health care professional as soon as possible: -back pain -chills -dizziness -fever -heavy menstrual bleeding or vaginal bleeding -painful, blue, or purple toes -painful, prolonged erection -signs and symptoms of bleeding such as bloody or black, tarry stools; red or dark-brown urine; spitting up blood or brown material that looks like coffee grounds; red spots on the skin; unusual bruising or bleeding from the eye, gums, or nose-skin rash, itching or skin damage -stomach pain -unusually weak or tired -yellowing of skin or eyes Side effects that usually do not require medical attention (report to your doctor or health care professional if they continue or are  bothersome): -diarrhea -hair loss This list may not describe all possible side effects. Call your doctor for medical advice about side effects. You may report side effects to FDA at 1-800-FDA-1088. Where should I keep my medicine? Keep out of the reach of children. Store at room temperature between 15 and 30 degrees C (59 and 86 degrees F). Protect from light. Throw away any unused medicine after the expiration date. Do not flush down the toilet. NOTE: This sheet is a summary. It may not cover all possible information. If you have questions about this medicine, talk to your doctor, pharmacist, or health care provider.  2015, [employer] Standard. (2013-06-26 12:17:56)

## 2014-11-24 NOTE — Progress Notes (Signed)
MRN: [id number] Name: Mary [name]  Sex: female Age: 48 y.o. DOB: 06/24/1966  Allergies: Review of patient's allergies indicates no known allergies.  Chief Complaint  Patient presents with  . Follow-up    HPI: Patient is 48 y.o. female who has history of hypertension, stroke, currently patient following up with the neurologist, her blood work showed lupus anticoagulant positive, as per her neurologist recommendation patient was referred to him uncle but her apparently patient missed appointment and needs to reschedule, she was also started on Coumadin for the last one week she is taking 5 mg today her INR is 1.2, as per recommendation she will continue with aspirin until her INR is between 2.5-3.5, and patient is to see him on for further recommendation, patient currently denies any acute symptoms, has been compliant in taking her medications.  Past Medical History  Diagnosis Date  . Hypertension   . Other and unspecified ovarian cysts   . Stroke     Past Surgical History  Procedure Laterality Date  . Ovarian cyst removal    . Tee without cardioversion N/A 01/25/2013    Procedure: TRANSESOPHAGEAL ECHOCARDIOGRAM (TEE);  Surgeon: Birdie Riddle, MD;  Location: [facility] ENDOSCOPY;  Service: Cardiovascular;  Laterality: N/A;      Medication List       This list is accurate as of: 11/24/14  1:01 PM.  Always use your most recent med list.               amLODipine 10 MG tablet  Commonly known as:  NORVASC  Take 1 tablet (10 mg total) by mouth daily.     aspirin EC 325 MG tablet  Take 1 tablet (325 mg total) by mouth daily.     atorvastatin 10 MG tablet  Commonly known as:  LIPITOR  Take 1 tablet (10 mg total) by mouth daily.     metoprolol tartrate 25 MG tablet  Commonly known as:  LOPRESSOR  Take 1 tablet (25 mg total) by mouth 2 (two) times daily.     tacrolimus 0.1 % ointment  Commonly known as:  PROTOPIC  Apply topically 2 (two) times daily.     triamcinolone  cream 0.1 %  Commonly known as:  KENALOG  Apply 1 application topically as needed.     warfarin 5 MG tablet  Commonly known as:  COUMADIN  Take 1 tablet (5 mg total) by mouth daily.        No orders of the defined types were placed in this encounter.     There is no immunization history on file for this patient.  Family History  Problem Relation Age of Onset  . Diabetes Mellitus II Mother   . Diabetes Mother   . Stroke Other   . Diabetes Other   . Transient ischemic attack Father   . Diabetes Sister     History  Substance Use Topics  . Smoking status: Never Smoker   . Smokeless tobacco: Never Used  . Alcohol Use: No    Review of Systems   As noted in HPI  Filed Vitals:   11/24/14 1207  BP: 138/85  Pulse: 82  Temp: 98 F (36.7 C)  Resp: 16    Physical Exam  Physical Exam  Constitutional: No distress.  Eyes: EOM are normal. Pupils are equal, round, and reactive to light.  Cardiovascular: Normal rate and regular rhythm.   Pulmonary/Chest: Breath sounds normal. No respiratory distress. She has no wheezes. She has no  rales.  Musculoskeletal: She exhibits no edema.    CBC    Component Value Date/Time   WBC 5.1 05/30/2014 1025   RBC 5.19* 05/30/2014 1025   HGB 11.3* 05/30/2014 1025   HCT 34.9* 05/30/2014 1025   PLT 281 05/30/2014 1025   MCV 67.2* 05/30/2014 1025   LYMPHSABS 2.1 05/30/2014 1025   MONOABS 0.3 05/30/2014 1025   EOSABS 0.2 05/30/2014 1025   BASOSABS 0.1 05/30/2014 1025    CMP     Component Value Date/Time   NA 137 05/30/2014 1025   K 4.0 05/30/2014 1025   CL 103 05/30/2014 1025   CO2 25 05/30/2014 1025   GLUCOSE 96 05/30/2014 1025   BUN 12 05/30/2014 1025   CREATININE 0.76 05/30/2014 1025   CREATININE 0.73 01/29/2013 0627   CALCIUM 8.4 05/30/2014 1025   PROT 7.9 10/07/2014 1109   PROT 7.5 05/30/2014 1025   ALBUMIN 3.6 05/30/2014 1025   AST 17 10/07/2014 1109   ALT 16 10/07/2014 1109   ALKPHOS 71 10/07/2014 1109   BILITOT  0.3 10/07/2014 1109   GFRNONAA >89 05/30/2014 1025   GFRNONAA >90 01/29/2013 0627   GFRAA >89 05/30/2014 1025   GFRAA >90 01/29/2013 0627    Lab Results  Component Value Date/Time   CHOL 126 05/30/2014 10:25 AM    No components found for: HGA1C  Lab Results  Component Value Date/Time   AST 17 10/07/2014 11:09 AM    Assessment and Plan  Cerebral embolism/H/O: stroke Patient is currently on aspirin as well as Coumadin 5 mg daily . Her INR today Is Results for orders placed or performed in visit on 11/24/14  INR  Result Value Ref Range   INR 1.2    Subtherapeutic, I have advised patient to increase the dose of Coumadin to 7.5 mg daily, recheck in 1 week, she will continue with aspirin until her INR is therapeutic between 2.5-3.5.  Lupus anticoagulant positive Patient is given the information and will call and reschedule her appointment with hematology.   Essential hypertension Patient will continue with current medications also advise for DASH diet.  Patient is advised to continue with her Lipitor as prescribed by her neurologist, will recheck level in 3 months    Return in about 3 months (around 02/23/2015) for hypertension, hyperipidemia, stroke.  Lorayne Marek, MD

## 2014-11-24 NOTE — Progress Notes (Signed)
Patient here for her routine three month follow up Has a history of Hypertension and stroke Currently taking 5mg  coumadin

## 2014-11-26 NOTE — Progress Notes (Signed)
Loop recorder 

## 2014-11-27 ENCOUNTER — Encounter (HOSPITAL_COMMUNITY): Payer: Self-pay | Admitting: Internal Medicine

## 2014-12-01 ENCOUNTER — Ambulatory Visit: Payer: BC Managed Care – PPO | Attending: Internal Medicine | Admitting: Pharmacist

## 2014-12-01 DIAGNOSIS — I639 Cerebral infarction, unspecified: Secondary | ICD-10-CM

## 2014-12-01 LAB — POCT INR: INR: 3.9

## 2014-12-01 MED ORDER — WARFARIN SODIUM 5 MG PO TABS: 5.0000 mg | ORAL_TABLET | Freq: Every day | ORAL | Status: DC

## 2014-12-08 ENCOUNTER — Ambulatory Visit: Payer: BC Managed Care – PPO | Attending: Internal Medicine | Admitting: Pharmacist

## 2014-12-08 DIAGNOSIS — I639 Cerebral infarction, unspecified: Secondary | ICD-10-CM

## 2014-12-08 LAB — POCT INR: INR: 7.3

## 2014-12-08 NOTE — Patient Instructions (Addendum)
Hold Coumadin Monday- Wednesday and return Thursday morning for recheck If you develop bleeding or frequent bruising go to ERWarfarin tablets What is this medicine? WARFARIN (WAR far in) is an anticoagulant. It is used to treat or prevent clots in the veins, arteries, lungs, or heart. This medicine may be used for other purposes; ask your health care provider or pharmacist if you have questions. COMMON BRAND NAME(S): Coumadin, Jantoven What should I tell my health care provider before I take this medicine? They need to know if you have any of these conditions: -alcoholism -anemia -bleeding disorders -cancer -diabetes -heart disease -high blood pressure -history of bleeding in the gastrointestinal tract -history of stroke or other brain injury or disease -kidney or liver disease -protein C deficiency -protein S deficiency -psychosis or dementia -recent injury, recent or planned surgery or procedure -an unusual or allergic reaction to warfarin, other medicines, foods, dyes, or preservatives -pregnant or trying to get pregnant -breast-feeding How should I use this medicine? Take this medicine by mouth with a glass of water. Follow the directions on the prescription label. You can take this medicine with or without food. Take your medicine at the same time each day. Do not take it more often than directed. Do not stop taking except on your doctor's advice. Stopping this medicine may increase your risk of a blood clot. Be sure to refill your prescription before you run out of medicine. If your doctor or healthcare professional calls to change your dose, write down the dose and any other instructions. Always read the dose and instructions back to him or her to make sure you understand them. Tell your doctor or healthcare professional what strength of tablets you have on hand. Ask how many tablets you should take to equal your new dose. Write the date on the new instructions and keep them near  your medicine. If you are told to stop taking your medicine until your next blood test, call your doctor or healthcare professional if you do not hear anything within 24 hours of the test to find out your new dose or when to restart your prior dose. A special MedGuide will be given to you by the pharmacist with each prescription and refill. Be sure to read this information carefully each time. Talk to your pediatrician regarding the use of this medicine in children. Special care may be needed. Overdosage: If you think you have taken too much of this medicine contact a poison control center or emergency room at once. NOTE: This medicine is only for you. Do not share this medicine with others. What if I miss a dose? It is important not to miss a dose. If you miss a dose, call your healthcare provider. Take the dose as soon as possible on the same day. If it is almost time for your next dose, take only that dose. Do not take double or extra doses to make up for a missed dose. What may interact with this medicine? Do not take this medicine with any of the following medications: -agents that prevent or dissolve blood clots -aspirin or other salicylates -danshen -dextrothyroxine -mifepristone -St. John's Wort -red yeast rice This medicine may also interact with the following medications: -acetaminophen -agents that lower cholesterol -alcohol -allopurinol -amiodarone -antibiotics or medicines for treating bacterial, fungal or viral infections -azathioprine -barbiturate medicines for inducing sleep or treating seizures -certain medicines for diabetes -certain medicines for heart rhythm problems -certain medicines for high blood pressure -chloral hydrate -cisapride -disulfiram -female hormones, including  contraceptive or birth control pills -general anesthetics -herbal or dietary products like garlic, ginkgo, ginseng, green tea, or kava kava -influenza virus vaccine -female  hormones -medicines for mental depression or psychosis -medicines for some types of cancer -medicines for stomach problems -methylphenidate -NSAIDs, medicines for pain and inflammation, like ibuprofen or naproxen -propoxyphene -quinidine, quinine -raloxifene -seizure or epilepsy medicine like carbamazepine, phenytoin, and valproic acid -steroids like cortisone and prednisone -tamoxifen -thyroid medicine -tramadol -vitamin c, vitamin e, and vitamin K -zafirlukast -zileuton This list may not describe all possible interactions. Give your health care provider a list of all the medicines, herbs, non-prescription drugs, or dietary supplements you use. Also tell them if you smoke, drink alcohol, or use illegal drugs. Some items may interact with your medicine. What should I watch for while using this medicine? Visit your doctor or health care professional for regular checks on your progress. You will need to have a blood test called a PT/INR regularly. The PT/INR blood test is done to make sure you are getting the right dose of this medicine. It is important to not miss your appointment for the blood tests. When you first start taking this medicine, these tests are done often. Once the correct dose is determined and you take your medicine properly, these tests can be done less often. Notify your doctor or health care professional and seek emergency treatment if you develop breathing problems; changes in vision; chest pain; severe, sudden headache; pain, swelling, warmth in the leg; trouble speaking; sudden numbness or weakness of the face, arm or leg. These can be signs that your condition has gotten worse. While you are taking this medicine, carry an identification card with your name, the name and dose of medicine(s) being used, and the name and phone number of your doctor or health care professional or person to contact in an emergency. Do not start taking or stop taking any medicines or  over-the-counter medicines except on the advice of your doctor or health care professional. You should discuss your diet with your doctor or health care professional. Do not make major changes in your diet. Vitamin K can affect how well this medicine works. Many foods contain vitamin K. It is important to eat a consistent amount of foods with vitamin K. Other foods with vitamin K that you should eat in consistent amounts are asparagus, basil, beef or pork liver, black eyed peas, broccoli, brussel sprouts, cabbage, chickpeas, cucumber with peel, green onions, green tea, okra, parsley, peas, thyme, and green leafy vegetables like beet greens, collard greens, endive, kale, mustard greens, spinach, turnip greens, watercress, or certain lettuces like green leaf or romaine. This medicine can cause birth defects or bleeding in an unborn child. Women of childbearing age should use effective birth control while taking this medicine. If a woman becomes pregnant while taking this medicine, she should discuss the potential risks and her options with her health care professional. Avoid sports and activities that might cause injury while you are using this medicine. Severe falls or injuries can cause unseen bleeding. Be careful when using sharp tools or knives. Consider using an Copy. Take special care brushing or flossing your teeth. Report any injuries, bruising, or red spots on the skin to your doctor or health care professional. If you have an illness that causes vomiting, diarrhea, or fever for more than a few days, contact your doctor. Also check with your doctor if you are unable to eat for several days. These problems can change the  effect of this medicine. Even after you stop taking this medicine, it takes several days before your body recovers its normal ability to clot blood. Ask your doctor or health care professional how long you need to be careful. If you are going to have surgery or dental work, tell  your doctor or health care professional that you have been taking this medicine. What side effects may I notice from receiving this medicine? Side effects that you should report to your doctor or health care professional as soon as possible: -back pain -chills -dizziness -fever -heavy menstrual bleeding or vaginal bleeding -painful, blue, or purple toes -painful, prolonged erection -signs and symptoms of bleeding such as bloody or black, tarry stools; red or dark-brown urine; spitting up blood or brown material that looks like coffee grounds; red spots on the skin; unusual bruising or bleeding from the eye, gums, or nose-skin rash, itching or skin damage -stomach pain -unusually weak or tired -yellowing of skin or eyes Side effects that usually do not require medical attention (report to your doctor or health care professional if they continue or are bothersome): -diarrhea -hair loss This list may not describe all possible side effects. Call your doctor for medical advice about side effects. You may report side effects to FDA at 1-800-FDA-1088. Where should I keep my medicine? Keep out of the reach of children. Store at room temperature between 15 and 30 degrees C (59 and 86 degrees F). Protect from light. Throw away any unused medicine after the expiration date. Do not flush down the toilet. NOTE: This sheet is a summary. It may not cover all possible information. If you have questions about this medicine, talk to your doctor, pharmacist, or health care provider.  2015, [employer]vier/[employer]. (2013-06-26 12:17:56)

## 2014-12-11 ENCOUNTER — Ambulatory Visit: Payer: BC Managed Care – PPO | Attending: Internal Medicine | Admitting: Pharmacist

## 2014-12-11 DIAGNOSIS — I639 Cerebral infarction, unspecified: Secondary | ICD-10-CM

## 2014-12-11 LAB — POCT INR: INR: 3.9

## 2014-12-16 ENCOUNTER — Ambulatory Visit: Payer: BC Managed Care – PPO

## 2014-12-16 ENCOUNTER — Telehealth: Payer: Self-pay | Admitting: Oncology

## 2014-12-16 ENCOUNTER — Encounter: Payer: Self-pay | Admitting: Oncology

## 2014-12-16 ENCOUNTER — Other Ambulatory Visit: Payer: BC Managed Care – PPO

## 2014-12-16 ENCOUNTER — Ambulatory Visit (HOSPITAL_BASED_OUTPATIENT_CLINIC_OR_DEPARTMENT_OTHER): Payer: BC Managed Care – PPO | Admitting: Oncology

## 2014-12-16 VITALS — BP 165/83 | HR 84 | Temp 97.7°F | Resp 19 | Ht 65.0 in | Wt 301.1 lb

## 2014-12-16 DIAGNOSIS — D6859 Other primary thrombophilia: Secondary | ICD-10-CM

## 2014-12-16 DIAGNOSIS — D6862 Lupus anticoagulant syndrome: Secondary | ICD-10-CM

## 2014-12-16 DIAGNOSIS — Z8673 Personal history of transient ischemic attack (TIA), and cerebral infarction without residual deficits: Secondary | ICD-10-CM

## 2014-12-16 DIAGNOSIS — Z130 Encounter for screening for diseases of the blood and blood-forming organs and certain disorders involving the immune mechanism: Secondary | ICD-10-CM

## 2014-12-16 DIAGNOSIS — Z86718 Personal history of other venous thrombosis and embolism: Secondary | ICD-10-CM

## 2014-12-16 NOTE — Consult Note (Signed)
Reason for Referral: Arterial and venous thrombosis.   HPI: 48 year old woman native of [place] currently resides in [place]. She has a history of obesity and long-standing high blood pressure and also history of a remote stroke per her report about 10 years ago. She did not report the details associated with that episode that occurred while she was living in [place]. She was in her usual state of health until she had a cyst removed from her ovary at [facility] on 01/21/2013. She subsequently presented to [facility] with difficulty speaking and confusion. Her evaluation revealed an acute CVA with left parietal infarct. She was on a TPA candidate at that time. Her TEE did not reveal any PFO. She also was found to have acute deep vein thrombosis involving the right posterior tibial vein and the left posterior tibial vein. She was discharged on Xarelto at that time. Her hypercoagulable workup in February 2014 revealed a lupus anticoagulant that was detected. The rest of it was otherwise unremarkable. Her anticardiolipin antibodies were all within normal range. Beta-2 glycoprotein antibodies were not checked at that time. After 3 months of anticoagulation, Xarelto was discontinued. Her most recent follow-up with neurology in October 2015 her anticardiolipin antibodies were repeated and they were normal. Beta-2 glycoprotein showed an elevated IgA but normal IgG and IgM. At that time, she was instructed to start warfarin and she has been on it since that time. She also had a sickle cell screen was positive. I was asked to comment about these findings. Clinically, she is being doing slightly better but still have some residual deficits. Predominately associated with speech and comprehension. She does not report any specific weakness or sensory deficits. She had been on medical disability but able to work part-time at [employer]. Other than a vague history of a stroke 10 years ago, she  does not have any thrombosis history. She does not have any personal history of miscarriages or obstetrical complications. She never had any previous deep vein thrombosis or pulmonary embolism. She has no family history to suggest thrombosis, miscarriages or hemoglobinopathy such as sickle cell.  She does not report any headaches, blurry vision, syncope or seizures. She does not report any fevers, chills, sweats, weight loss or appetite changes. She does not report any chest pain, palpitation or dyspnea on exertion. She does not report any cough, hemoptysis or wheezing. She does not report any nausea, vomiting, constipation, diarrhea or change in her bowel habits. She does not report any frequency urgency or hesitancy. She does not report any skeletal complaints. She does not report any bleeding complications such as epistaxis or hematochezia. Rest of her review of systems unremarkable.   Past Medical History  Diagnosis Date  . Hypertension   . Other and unspecified ovarian cysts   . Stroke   :  Past Surgical History  Procedure Laterality Date  . Ovarian cyst removal    . Tee without cardioversion N/A 01/25/2013    Procedure: TRANSESOPHAGEAL ECHOCARDIOGRAM (TEE);  Surgeon: Birdie Riddle, MD;  Location: [facility];  Service: Cardiovascular;  Laterality: N/A;  . Loop recorder implant N/A 04/25/2014    Procedure: LOOP RECORDER IMPLANT;  Surgeon: Coralyn Mark, MD;  Location: [facility] CATH LAB;  Service: Cardiovascular;  Laterality: N/A;  :  Current Outpatient Prescriptions  Medication Sig Dispense Refill  . amLODipine (NORVASC) 10 MG tablet Take 1 tablet (10 mg total) by mouth daily. 90 tablet 3  . atorvastatin (LIPITOR) 10 MG tablet  Take 1 tablet (10 mg total) by mouth daily. 90 tablet 3  . metoprolol tartrate (LOPRESSOR) 25 MG tablet Take 1 tablet (25 mg total) by mouth 2 (two) times daily. 180 tablet 3  . tacrolimus (PROTOPIC) 0.1 % ointment Apply topically 2 (two) times daily.     Marland Kitchen  triamcinolone cream (KENALOG) 0.1 % Apply 1 application topically as needed.     . warfarin (COUMADIN) 5 MG tablet Take 1 tablet (5 mg total) by mouth daily. 30 tablet 3   Current Facility-Administered Medications  Medication Dose Route Frequency Provider Last Rate Last Dose  . cloNIDine (CATAPRES) tablet 0.1 mg  0.1 mg Oral Once Ripudeep Krystal Eaton, MD           No Known Allergies:  Family History  Problem Relation Age of Onset  . Diabetes Mellitus II Mother   . Diabetes Mother   . Stroke Other   . Diabetes Other   . Transient ischemic attack Father   . Diabetes Sister   :  History   Social History  . Marital Status: Single    Spouse Name: N/A    Number of Children: 2  . Years of Education: 13   Occupational History  . Not on file.   Social History Main Topics  . Smoking status: Never Smoker   . Smokeless tobacco: Never Used  . Alcohol Use: No  . Drug Use: No  . Sexual Activity: Yes   Other Topics Concern  . Not on file   Social History Narrative   Patient is single with two children.   Patient is right handed.   Patient has 13 yrs of education.   Patient drinks 5 sodas daily.  :  Pertinent items are noted in HPI.  Exam: ECOG 1 Blood pressure 165/83, pulse 84, temperature 97.7 F (36.5 C), temperature source Oral, resp. rate 19, height 5\' 5"  (1.651 m), weight 301 lb 1.6 oz (136.578 kg), SpO2 99 %. General appearance: alert and cooperative Head: Normocephalic, without obvious abnormality Throat: lips, mucosa, and tongue normal; teeth and gums normal Neck: no adenopathy Back: negative Resp: clear to auscultation bilaterally Cardio: regular rate and rhythm, S1, S2 normal, no murmur, click, rub or gallop GI: soft, non-tender; bowel sounds normal; no masses,  no organomegaly Extremities: extremities normal, atraumatic, no cyanosis or edema Pulses: 2+ and symmetric Skin: Skin color, texture, turgor normal. No rashes or lesions Lymph nodes: Cervical,  supraclavicular, and axillary nodes normal. Neurologic: Grossly normal  CBC    Component Value Date/Time   WBC 5.1 05/30/2014 1025   RBC 5.19* 05/30/2014 1025   HGB 11.3* 05/30/2014 1025   HCT 34.9* 05/30/2014 1025   PLT 281 05/30/2014 1025   MCV 67.2* 05/30/2014 1025   MCH 21.8* 05/30/2014 1025   MCHC 32.4 05/30/2014 1025   RDW 17.6* 05/30/2014 1025   LYMPHSABS 2.1 05/30/2014 1025   MONOABS 0.3 05/30/2014 1025   EOSABS 0.2 05/30/2014 1025   BASOSABS 0.1 05/30/2014 1025   Results for BILLI, BRIGHT (MRN [id number]) as of 12/16/2014 10:13  Ref. Range 10/07/2014 11:09  Anticardiolipin IgG Latest Range: 0-14 GPL U/mL <9  Anticardiolipin IgM Latest Range: 0-12 MPL U/mL <9  Beta-2 Glyco I IgG Latest Range: 0-20 GPI IgG units <9  Beta-2 Glyco 1 IgM Latest Range: 0-32 GPI IgM units <9  Beta-2 Glyco 1 IgA Latest Range: 0-25 GPI IgA units 31 (H)   Results for PRESLEIGH, FELDSTEIN (MRN [id number]) as of 12/16/2014 10:13  Ref.  Range 01/24/2013 13:30  Anticardiolipin IgA Latest Range: <22 APL U/mL 7 (L)  Anticardiolipin IgG Latest Range: 0-14 GPL U/mL 4 (L)  Anticardiolipin IgM Latest Range: 0-12 MPL U/mL 3 (L)  PTT Lupus Anticoagulant Latest Range: 28.0-43.0 secs 44.3 (H)  PTTLA Confirmation Latest Range: <8.0 secs 11.5 (H)  PTTLA 4:1 Mix Latest Range: 28.0-43.0 secs 45.6 (H)  DRVVT Latest Range: <42.9 secs 38.8  Drvvt confirmation Latest Range: <1.11 Ratio NOT APPL  dRVVT Incubated 1:1 Mix Latest Range: <42.9 secs NOT APPL  Lupus Anticoagulant Latest Range: NOT DETECTED  DETECTED (A)    Assessment and Plan:   49 year old woman with the following issues:  1. History of arterial and venous thrombosis after presenting with expressive aphasia and found to have a subacute left parietal MCA branch infarct. She also was found to have a deep pain thrombosis. Her laboratory testing showed a positive lupus anticoagulant and February 2014 and a positive IgA beta 2 glycoprotein antibodies and  November 2015. The natural course of inherited and acquired thrombophilia was discussed today with Mrs. Mary [name]. Her clinical presentation suggest the presence of antiphospholipid antibody syndrome. She did have venous and arterial thrombus without any evidence of a PFO. Her laboratory testing suggests that but need confirmatory testing. Her positive lupus anticoagulant in February 2014 could be misinterpreted and acute setting and needs to be repeated and steady state. Her IgA antibody positive qualifies as a minor criteria for antiphospholipid antibody syndrome. However, I think her clinical presentation strongly suggests the presence of antiphospholipid antibody syndrome.  From a management standpoint, I agree with full dose anticoagulation for life and indefinitely for the time being. I will repeat her lupus anticoagulant as well as her beta-2 glycoprotein antibodies in 4 months for confirmatory findings to better identify whether she has a transient antiphospholipid antibodies or these are permanent findings.  I do not think we need to add antiplatelet agents at this time unless she fails warfarin therapy.  2. Positive sickle cell screen. I do not think this is has to do with her thrombosis but certainly worth investigation. I will obtain hemoglobin electrophoresis to better quantify this at this time. Her hemoglobin is close to normal and does not need any intervention at this time.  For questions were answered to her satisfaction.

## 2014-12-16 NOTE — Progress Notes (Signed)
Please see consult note.  

## 2014-12-16 NOTE — Progress Notes (Signed)
Checked in new pt with no financial concerns at this time.  Pt states she's here for a hematology concern so financial assistance may not be needed but she has my card for any billing or insurance questions or concerns.

## 2014-12-16 NOTE — Telephone Encounter (Signed)
Pt confirmed labs/ov per 12/28 POF, gave pt AVS.... KJ °

## 2014-12-18 ENCOUNTER — Ambulatory Visit: Payer: BC Managed Care – PPO | Attending: Internal Medicine | Admitting: Pharmacist

## 2014-12-18 ENCOUNTER — Encounter: Payer: Self-pay | Admitting: Internal Medicine

## 2014-12-18 DIAGNOSIS — I8290 Acute embolism and thrombosis of unspecified vein: Secondary | ICD-10-CM | POA: Diagnosis not present

## 2014-12-18 LAB — POCT INR: INR: 2.9

## 2014-12-23 ENCOUNTER — Ambulatory Visit (INDEPENDENT_AMBULATORY_CARE_PROVIDER_SITE_OTHER): Payer: Self-pay | Admitting: *Deleted

## 2014-12-23 DIAGNOSIS — I639 Cerebral infarction, unspecified: Secondary | ICD-10-CM

## 2014-12-23 NOTE — Progress Notes (Signed)
Order(s) created erroneously. Erroneous order ID: [id number]  Order moved by: Georjean Mode B  Order move date/time: 12/23/2014 8:33 AM  Source Patient: [id number]  Source Contact: 12/18/2014  Destination Patient: [id number]  Destination Contact: 03/05/2013

## 2014-12-25 ENCOUNTER — Ambulatory Visit: Payer: Self-pay | Attending: Internal Medicine | Admitting: Pharmacist

## 2014-12-25 DIAGNOSIS — I639 Cerebral infarction, unspecified: Secondary | ICD-10-CM

## 2014-12-25 LAB — POCT INR: INR: 5.9

## 2014-12-25 NOTE — Patient Instructions (Signed)
Warfarin: What You Need to Know Warfarin is an anticoagulant. Anticoagulants help prevent the formation of blood clots. They also help stop the growth of blood clots. Warfarin is sometimes referred to as a "blood thinner."  Normally, when body tissues are cut or damaged, the blood clots in order to prevent blood loss. Sometimes clots form inside your blood vessels and obstruct the flow of blood through your circulatory system (thrombosis). These clots may travel through your bloodstream and become lodged in smaller blood vessels in your brain, which can cause a stroke, or in your lungs (pulmonary embolism). WHO SHOULD USE WARFARIN? Warfarin is prescribed for people at risk of developing harmful blood clots:  People with surgically implanted mechanical heart valves, irregular heart rhythms called atrial fibrillation, and certain clotting disorders.  People who have developed harmful blood clotting in the past, including those who have had a stroke or a pulmonary embolism, or thrombosis in their legs (deep vein thrombosis [DVT]).  People with an existing blood clot, such as a pulmonary embolism. WARFARIN DOSING Warfarin tablets come in different strengths. Each tablet strength is a different color, with the amount of warfarin (in milligrams) clearly printed on the tablet. If the color of your tablet is different than usual when you receive a new prescription, report it immediately to your pharmacist or health care provider. WARFARIN MONITORING The goal of warfarin therapy is to lessen the clotting tendency of blood but not prevent clotting completely. Your health care provider will monitor the anticoagulation effect of warfarin closely and adjust your dose as needed. For your safety, blood tests called prothrombin time (PT) or international normalized ratio (INR) are used to measure the effects of warfarin. Both of these tests can be done with a finger stick or a blood draw. The longer it takes the  blood to clot, the higher the PT or INR. Your health care provider will inform you of your "target" PT or INR range. If, at any time, your PT or INR is above the target range, there is a risk of bleeding. If your PT or INR is below the target range, there is a risk of clotting. Whether you are started on warfarin while you are in the hospital or in your health care provider's office, you will need to have your PT or INR checked within one week of starting the medicine. Initially, some people are asked to have their PT or INR checked as much as twice a week. Once you are on a stable maintenance dose, the PT or INR is checked less often, usually once every 2 to 4 weeks. The warfarin dose may be adjusted if the PT or INR is not within the target range. It is important to keep all laboratory and health care provider follow-up appointments. Not keeping appointments could result in a chronic or permanent injury, pain, or disability because warfarin is a medicine that requires close monitoring. WHAT ARE THE SIDE EFFECTS OF WARFARIN?  Too much warfarin can cause bleeding (hemorrhage) from any part of the body. This may include bleeding from the gums, blood in the urine, bloody or dark stools, a nosebleed that is not easily stopped, coughing up blood, or vomiting blood.  Too little warfarin can increase the risk of blood clots.  Too little or too much warfarin can also increase the risk of a stroke.  Warfarin use may cause a skin rash or irritation, an unusual fever, continual nausea or stomach upset, or severe pain in your joints or back.   SPECIAL PRECAUTIONS WHILE TAKING WARFARIN Warfarin should be taken exactly as directed. It is very important to take warfarin as directed since bleeding or blood clots could result in chronic or permanent injury, pain, or disability.  Take your medicine at the same time every day. If you forget to take your dose, you can take it if it is within 6 hours of when it was  due.  Do not change the dose of warfarin on your own to make up for missed or extra doses.  If you miss more than 2 doses in a row, you should contact your health care provider for advice. Avoid situations that cause bleeding. You may have a tendency to bleed more easily than usual while taking warfarin. The following actions can limit bleeding:  Using a softer toothbrush.  Flossing with waxed floss rather than unwaxed floss.  Shaving with an electric razor rather than a blade.  Limiting the use of sharp objects.  Avoiding potentially harmful activities, such as contact sports. Warfarin and Pregnancy or Breastfeeding  Warfarin is not advised during the first trimester of pregnancy due to an increased risk of birth defects. In certain situations, a woman may take warfarin after her first trimester of pregnancy. A woman who becomes pregnant or plans to become pregnant while taking warfarin should notify her health care provider immediately.  Although warfarin does not pass into breast milk, a woman who wishes to breastfeed while taking warfarin should also consult with her health care provider. Alcohol, Smoking, and Illicit Drug Use  Alcohol affects how warfarin works in the body. It is best to avoid alcoholic drinks or consume very small amounts while taking warfarin. In general, alcohol intake should be limited to 1 oz (30 mL) of liquor, 6 oz (180 mL) of wine, or 12 oz (360 mL) of beer each day. Notify your health care provider if you change your alcohol intake.  Smoking affects how warfarin works. It is best to avoid smoking while taking warfarin. Notify your health care provider if you change your smoking habits.  It is best to avoid all illicit drugs while taking warfarin since there are few studies that show how warfarin interacts with these drugs. Other Medicines and Dietary Supplements Many prescription and over-the-counter medicines can interfere with warfarin. Be sure all of your  health care providers know you are taking warfarin. Notify your health care provider who prescribed warfarin for you or your pharmacist before starting or stopping any new medicines, including over-the-counter vitamins, dietary supplements, and pain medicines. Your warfarin dose may need to be adjusted. Some common over-the-counter medicines that may increase the risk of bleeding while taking warfarin include:   Acetaminophen.  Aspirin.  Nonsteroidal anti-inflammatory medicines (NSAIDs), such as ibuprofen or naproxen.  Vitamin E. Dietary Considerations  Foods that have moderate or high amounts of vitamin K can interfere with warfarin. Avoid major changes in your diet or notify your health care provider before changing your diet. Eat a consistent amount of foods that have moderate or high amounts of vitamin K. Eating less foods containing vitamin K can increase the risk of bleeding. Eating more foods containing vitamin K can increase the risk of blood clots. Additional questions about dietary considerations can be discussed with a dietitian. Foods that are very high in vitamin K:  Greens, such as Swiss chard and beet, collard, mustard, or turnip greens (fresh or frozen, cooked).  Kale (fresh or frozen, cooked).  Parsley (raw).  Spinach (cooked). Foods that are high   in vitamin K:  Asparagus (frozen, cooked).  Beans, green (frozen, cooked).  Broccoli.  Bok choy (cooked).  Brussels sprouts (fresh or frozen, cooked).  Cabbage (cooked).   Coleslaw. Foods that are moderately high in vitamin K:  Blueberries.  Black-eyed peas.  Endive (raw).  Green leaf lettuce (raw).  Green scallions (raw).  Kale (raw).  Okra (frozen, cooked).  Plantains (fried).  Romaine lettuce (raw).  Sauerkraut (canned).  Spinach (raw). CALL YOUR CLINIC OR HEALTH CARE PROVIDER IF YOU:  Plan to have any surgery or procedure.  Feel sick, especially if you have diarrhea or  vomiting.  Experience or anticipate any major changes in your diet.  Start or stop a prescription or over-the-counter medicine.  Become, plan to become, or think you may be pregnant.  Are having heavier than usual menstrual periods.  Have had a fall, accident, or any symptoms of bleeding or unusual bruising.  Develop an unusual fever. CALL 911 IN THE [place] OR GO TO THE EMERGENCY DEPARTMENT IF YOU:   Think you may be having an allergic reaction to warfarin. The signs of an allergic reaction could include itching, rash, hives, swelling, chest tightness, or trouble breathing.  See signs of blood in your urine. The signs could include reddish, pinkish, or tea-colored urine.  See signs of blood in your stools. The signs could include bright red or black stools.  Vomit or cough up blood. In these instances, the blood could have either a bright red or a "coffee-grounds" appearance.  Have bleeding that will not stop after applying pressure for 30 minutes such as cuts, nosebleeds, or other injuries.  Have severe pain in your joints or back.  Have a new and severe headache.  Have sudden weakness or numbness of your face, arm, or leg, especially on one side of your body.  Have sudden confusion or trouble understanding.  Have sudden trouble seeing in one or both eyes.  Have sudden trouble walking, dizziness, loss of balance, or coordination.  Have trouble speaking or understanding (aphasia). Document Released: 12/05/2005 Document Revised: 04/21/2014 Document Reviewed: 05/31/2013 Exit[employer] Patient Information 2015 [employer]. This information is not intended to replace advice given to you by your health care provider. Make sure you discuss any questions you have with your health care provider.  

## 2014-12-26 NOTE — Progress Notes (Signed)
Loop recorder 

## 2015-01-01 ENCOUNTER — Ambulatory Visit: Payer: Self-pay | Attending: Internal Medicine | Admitting: Pharmacist

## 2015-01-01 DIAGNOSIS — I639 Cerebral infarction, unspecified: Secondary | ICD-10-CM

## 2015-01-01 LAB — POCT INR: INR: 4.7

## 2015-01-05 LAB — MDC_IDC_ENUM_SESS_TYPE_REMOTE

## 2015-01-08 ENCOUNTER — Ambulatory Visit: Payer: Self-pay | Attending: Internal Medicine | Admitting: Pharmacist

## 2015-01-08 DIAGNOSIS — I8290 Acute embolism and thrombosis of unspecified vein: Secondary | ICD-10-CM

## 2015-01-08 LAB — POCT INR: INR: 2

## 2015-01-15 ENCOUNTER — Ambulatory Visit: Payer: Self-pay | Attending: Internal Medicine | Admitting: Pharmacist

## 2015-01-15 ENCOUNTER — Ambulatory Visit (INDEPENDENT_AMBULATORY_CARE_PROVIDER_SITE_OTHER): Payer: Self-pay | Admitting: Neurology

## 2015-01-15 ENCOUNTER — Encounter: Payer: Self-pay | Admitting: Neurology

## 2015-01-15 VITALS — Ht 65.0 in | Wt 303.2 lb

## 2015-01-15 DIAGNOSIS — E669 Obesity, unspecified: Secondary | ICD-10-CM

## 2015-01-15 DIAGNOSIS — D6861 Antiphospholipid syndrome: Secondary | ICD-10-CM

## 2015-01-15 DIAGNOSIS — I639 Cerebral infarction, unspecified: Secondary | ICD-10-CM

## 2015-01-15 DIAGNOSIS — I10 Essential (primary) hypertension: Secondary | ICD-10-CM

## 2015-01-15 LAB — POCT INR: INR: 3.8

## 2015-01-15 NOTE — Progress Notes (Signed)
STROKE NEUROLOGY FOLLOW UP NOTE  NAME: Mary [name] DOB: 07-19-1966  REASON FOR VISIT: stroke follow up HISTORY FROM: pt and chart  Today we had the pleasure of seeing Mary [name] in follow-up at our Neurology Clinic. Pt was accompanied by no one.   History Summary Mary [name] is a 49 year old lady with PMH of HTN and obesity was admitted for stroke on 01/23/13. She presented with several days history of slurred speech and expressive aphasia. Her symptoms actually began after an ovarian biopsy and she initially came to the ER and was discharged home but symptoms persisted and a few days later she returned and CT scan of the head this time showed a subacute left parietal MCA branch infarct. CT angiogram of the brain showed occluded distal branch of left middle cerebral artery corresponding to the infarct territory. CT angiogram of the neck showed no significant extracranial stenosis. MRI of the brain could not be obtained due to patient's obesity as well as severe claustrophobia. Transthoracic echo showed normal ejection fraction. Transesophageal echocardiogram showed no patent foramen ovale, vegetation or clot. EKG and telemetry monitoring did not reveal and cardiac arrhythmias. ESR,complements. ANA panel, RPR, HIV, antithrombin III and homocystine were all normal. The patient had significant expressive aphasia and was seen by physical, occupational and speech therapy and who was found to have deep vein thrombosis and hence was started on anticoagulation with xarelto. She was discharged and has continued to have outpatient speech therapy and has obtained some modest improvement in her speech. She still has significant word hesitancy but is able to communicate. She complains of mild headaches every other day for which she takes Tylenol which seemed to work quite well. She works as a Freight forwarder at [employer] and states she will be unable to perform her job in her current situation.   Follow up  04/15/13 - She reports no significant change in her expressive aphasia. She has no new complaints today. She still not working and has questions about when can she drive. TCD bubble study was negative.   Follow up 10/14/13 (LL): she comes back for stroke revisit. She is doing well, PCP changed her from Norvasc to Metoprolol because she was having frequent headaches, which has helped. She stopped Nortriptyline due to lethargy and it was not helping her headaches. She may need to restart Norvasc though, because BP is elevated today, 162/98. She is still in speech therapy .   Follow up 04/18/14 (LL): Since last visit, she returned to working part-time at [employer] in an different job role. She is very frustrated that she is not able to function in her previous capacity. She continues with ST twice weekly. Her blood pressure is better controlled on Norvasc and Metoprolol, though elevated in the office today at 155/93. She states her headaches are less frequent.   Follow up 10/07/14 - the patient has been doing OK. She is on norvac and metoprolol for BP control and today is 123/80. However, she stated that last Friday, she was working in [place], she felt funny feeling, not able to do what she was able to do before, she kept asking her co-workers, how to do this, how to do that, she has some difficulty understanding and express herself. She was thinking and talking slow, she felt not herself. On Saturday, she felt fine. She has not back to work yet. She went to her PCP and was told to be off work for a week and follow up  with neurology.   She was started on Xarelto for DVT in 01/2013 but stopped after 3 months. Suppose on ASA 65m as per chart, but currently she is not on any antiplatelet. She also had loop recorder placed in 05/2014 by Dr. ARayann Hemanand so far no Afib episodes found. However, her hypercoagulable work up initially showed positive lupus coagulant, no repeat done yet.  Follow up 11/17/14 - she had EEG  which did not show seizure like activity but left temporal slowing which is consistent with her stroke in the past. Her repeat lab test showed positive sickle cell screening and again positive lupus anticoagulant panel with high hexagonal phospholipid Neutral. She has not seen hematology yet. Her loop recorder so far is negative for AF episodes. She stated that she continued to have intermittent episodes with word finding difficulties, difficulty with comprehension and confusion at work, especially when she feels tired and stressed out, but she stated that she gradually is getting better than before. BP today 143/89.  Interval History During the interval time, she was doing the same. She still has some intermittent confusion and word finding difficulties but she has got used to it. She followed up with PCP and oncologist Dr. SAlen Blewand considered antiphospholipid syndrome and agreed on anticoagulation with coumadin. INR goal 2.5-3.5. Her INR fluctuates and currently following with coumadin clinic. INR last check 2.0. BP 143/89. Loop recorder continued to have no AF episodes.  REVIEW OF SYSTEMS: Full 14 system review of systems performed and notable only for those listed below and in HPI above, all others are negative:  Constitutional: fatigue  Cardiovascular: N/A  Ear/Nose/Throat: N/A  Skin: N/A  Eyes: N/A  Respiratory: N/A  Gastroitestinal: N/A  Genitourinary: N/A Hematology/Lymphatic: N/A  Endocrine: N/A  Musculoskeletal: N/A  Allergy/Immunology: N/A  Neurological: dizziness, HA Psychiatric: N/A  The following represents the patient's updated allergies and side effects list: No Known Allergies  The neurologically relevant items on the patient's problem list were reviewed on today's visit.  Neurologic Examination  A problem focused neurological exam (12 or more points of the single system neurologic examination, vital signs counts as 1 point, cranial nerves count for 8 points) was  performed.  Height 5' 5" (1.651 m), weight 303 lb 3.2 oz (137.531 kg).  General - Well nourished, well developed, in no apparent distress.  Ophthalmologic - not able to see through  Cardiovascular - Regular rate and rhythm with no murmur.  Mental Status -  Level of arousal and orientation to time, place, and person were intact. Language including expression, naming, repetition, comprehension was assessed and found intact.  Cranial Nerves II - XII - II - Visual field intact OU. III, IV, VI - Extraocular movements intact. V - Facial sensation intact bilaterally. VII - Facial movement intact bilaterally. VIII - Hearing & vestibular intact bilaterally. X - Palate elevates symmetrically. XI - Chin turning & shoulder shrug intact bilaterally. XII - Tongue protrusion intact.  Motor Strength - The patient's strength was normal in all extremities and pronator drift was absent.  Bulk was normal and fasciculations were absent.   Motor Tone - Muscle tone was assessed at the neck and appendages and was normal.  Reflexes - The patient's reflexes were normal in all extremities and she had no pathological reflexes.  Sensory - Light touch, temperature/pinprick, vibration and proprioception, and Romberg testing were assessed and were normal.    Coordination - The patient had normal movements in the hands and feet with no ataxia or  dysmetria.  Tremor was absent.  Gait and Station - The patient's transfers, posture, gait, station, and turns were observed as normal.  Data reviewed: I personally reviewed the images and agree with the radiology interpretations.  01/23/13 CT head 1. Early subacute left parietal lobe infarct.  2. No evidence of intracranial hemorrhage, mass effect, or midline shift.  3. Chronic small vessel disease and old deep white matter infarcts are seen bilaterally  01/24/14 CTA head and neck 1. No significant stenosis of the cervical vasculature.  2. Mild atherosclerotic  irregularity of the left carotid bifurcation without significant stenosis 3. Occluded distal left MCA branch vessel corresponding to the  infarct territory.  4. Moderate small vessel disease.  5. Mild atherosclerotic irregularity within the cavernous carotid  arteries bilaterally without significant stenosis.  TCD bubble study - 04/26/13 No R to L shunt, negative bubble study.  EEG 10/22/14 - This is an abnormal EEG recording secondary to intermittent theta slowing emanating from the left temporal region. This slowing may correlate with the prior stroke event, and no clear epileptiform discharges were seen during the study. Clinical correlation is required.  Component     Latest Ref Rng 01/24/2013 05/30/2014 10/07/2014  Prothrombin Time        13.7  INR        1.1  APTT        40.1 (H)  APTT 1:1 NP        33.7  APTT 1:1 Saline        50.7  THROMBIN TIME        16.7  DRVVT Screen Seconds        42.6  DRVVT Confirm Seconds        CANCELED  DRVVT Ratio        CANCELED  Hexagonal Phospholipid Neutral        9 (H)  Platelet Neutralization        5.7 (H)  Anticardiolipin Ab, IgG        CANCELED  Anticardiolipin Ab, IgM        CANCELED  Beta-2 Glycoprotein I, IgG        CANCELED  Beta-2 Glycoprotein I, IgM        CANCELED  Beta-2 Glycoprotein I, IgA        CANCELED  LAC Interpretation        Comment  PTT Lupus Anticoagulant     28.0 - 43.0 secs 44.3 (H)    PTTLA Confirmation     <8.0 secs 11.5 (H)    PTTLA 4:1 Mix     28.0 - 43.0 secs 45.6 (H)    DRVVT     <42.9 secs 38.8    Drvvt confirmation     <1.11 Ratio NOT APPL    dRVVT Incubated 1:1 Mix     <42.9 secs NOT APPL    Lupus Anticoagulant     NOT DETECTED DETECTED (A)    Total Protein     6.0 - 8.5 g/dL   7.9  Albumin     3.5 - 5.5 g/dL   3.9  Total Bilirubin     0.0 - 1.2 mg/dL   0.3  Bilirubin, Direct     0.00 - 0.40 mg/dL   0.10  Alkaline Phosphatase     39 - 117 IU/L   71  AST     0 - 40 IU/L   17    ALT     0 - 32 IU/L  16  ENA RNP Ab     0.0 - 0.9 AI   0.3  ENA SM Ab Ser-aCnc     0.0 - 0.9 AI   <0.2  Rhuematoid fact SerPl-aCnc     0.0 - 13.9 IU/mL   11.0  Chromatin Ab SerPl-aCnc     0.0 - 0.9 AI   <0.2  ENA SSA (RO) Ab     0.0 - 0.9 AI   <0.2  ENA SSB (LA) Ab     0.0 - 0.9 AI   <0.2  dsDNA Ab     0 - 9 IU/mL   1  Cholesterol     0 - 200 mg/dL  126   Triglycerides     <150 mg/dL  91   HDL     >39 mg/dL  40   Total CHOL/HDL Ratio       3.2   VLDL     0 - 40 mg/dL  18   LDL (calc)     0 - 99 mg/dL  68   Anticardiolipin IgG     0 - 14 GPL U/mL 4 (L)  <9  Anticardiolipin IgM     0 - 12 MPL U/mL 3 (L)  <9  Anticardiolipin IgA     <22 APL U/mL 7 (L)    C-ANCA     Neg:<1:20 titer   <1:20  P-ANCA     Neg:<1:20 titer   <1:20  Atypical pANCA     Neg:<1:20 titer   <1:20  Beta-2 Glyco I IgG     0 - 20 GPI IgG units   <9  Beta-2 Glyco 1 IgA     0 - 25 GPI IgA units   31 (H)  Beta-2 Glyco 1 IgM     0 - 32 GPI IgM units   <9  Hgb A1c MFr Bld     4.8 - 5.6 %   6.3 (H)  Est. average glucose Bld gHb Est-mCnc        134  C3 Complement     90 - 180 mg/dL 160    Complement C4, Body Fluid     10 - 40 mg/dL 34    ANA Ser Ql     Negative NEGATIVE  Negative  RPR     NON REACTIVE NON REACTIVE    HIV     NON REACTIVE NON REACTIVE    AntiThromb III Func     75 - 120 % 107    Protein C Activity     74 - 151 % 153 (H)  144  Protein C, Total     72 - 160 % 88    Protein S Activity     60 - 145 % 55 (L)  66  Protein S Ag, Total     60 - 150 % 66    Homocysteine     4.0 - 15.4 umol/L 8.1    TSH     0.350 - 4.500 uIU/mL  1.903   Sickle Cell Prep     Negative   Positive (A)  Homocysteine     0.0 - 15.0 umol/L   9.9     Assessment: As you may recall, she is a 49 y.o. African American female with PMH of HTN and obesity was admitted on 01/2013 for left MCA stroke. Head CT showed bilateral old WM infarcts too. She was found to have DVT and put on Xarelto which was  stopped after 3  months. Hypercoagulable work up questions for antiphospholipid antibody syndrome. Repeat hypercoagulable work up again showed positive for lupus anticoagulant panel as well as positive sickle cell screening.  According to her history, young, female, with multiple arterial and venous thrombosis with positive anticoagulant tests x 2, antiphospholipid antibody syndrome is considered until proven otherwise. She also saw Dr. Alen Blew for APL and positive sickle cell screening, Dr. Alen Blew agreed on anticoagulation with coumadin with INR 2.5-3.5. Currently she is following with coumadin clinic.  Plan:  - continue coumadin for APL syndrome - INR goal 2.5-3.5. Follow up with coumadin clinic - LDL last check 69, OK without statin for now. If higher than 70, recommend statin - Follow up with your primary care physician for stroke risk factor modification. Recommend maintain blood pressure goal <130/80, diabetes with hemoglobin A1c goal below 6.5% and lipids with LDL cholesterol goal below 70 mg/dL.  - continue to follow up with Dr. Alen Blew - check BP at home. - regular exercise and weight loss - RTC in 6 months.  No orders of the defined types were placed in this encounter.    Meds ordered this encounter  Medications  . amLODipine (NORVASC) 5 MG tablet    Sig: Take 5 mg by mouth daily.    Patient Instructions  - continue coumadin for anticoagulation - INR 2.5 - 3.5, close follow up with coumadin clinic  - check BP at home - continue follow up with Dr. Alen Blew in [place] - Follow up with your primary care physician for stroke risk factor modification. Recommend maintain blood pressure goal <130/80, diabetes with hemoglobin A1c goal below 6.5% and lipids with LDL cholesterol goal below 70 mg/dL.  - regular exercise and lose weight - follow up in 6 months    Rosalin Hawking, MD PhD [facility] [address] [place], [place] [address] [phone]

## 2015-01-15 NOTE — Patient Instructions (Addendum)
-   continue coumadin for anticoagulation - INR 2.5 - 3.5, close follow up with coumadin clinic  - check BP at home - continue follow up with Dr. Alen Blew in [place] - Follow up with your primary care physician for stroke risk factor modification. Recommend maintain blood pressure goal <130/80, diabetes with hemoglobin A1c goal below 6.5% and lipids with LDL cholesterol goal below 70 mg/dL.  - regular exercise and lose weight - follow up in 6 months

## 2015-01-20 ENCOUNTER — Telehealth: Payer: Self-pay

## 2015-01-20 ENCOUNTER — Ambulatory Visit: Payer: Self-pay | Attending: Internal Medicine

## 2015-01-20 NOTE — Telephone Encounter (Signed)
Patient called inquiring what cough medicine she can take while on coumadin Per Dr Annitta Needs patient can take OTC robitussin [facility] Patient is aware

## 2015-01-22 ENCOUNTER — Ambulatory Visit (INDEPENDENT_AMBULATORY_CARE_PROVIDER_SITE_OTHER): Payer: Self-pay | Admitting: *Deleted

## 2015-01-22 ENCOUNTER — Encounter: Payer: Self-pay | Admitting: Internal Medicine

## 2015-01-22 ENCOUNTER — Ambulatory Visit: Payer: Self-pay | Attending: Internal Medicine | Admitting: Pharmacist

## 2015-01-22 DIAGNOSIS — I639 Cerebral infarction, unspecified: Secondary | ICD-10-CM

## 2015-01-22 LAB — POCT INR: INR: 2

## 2015-01-23 NOTE — Progress Notes (Signed)
Loop recorder 

## 2015-01-29 ENCOUNTER — Ambulatory Visit: Payer: Self-pay | Attending: Internal Medicine | Admitting: Pharmacist

## 2015-01-29 DIAGNOSIS — I639 Cerebral infarction, unspecified: Secondary | ICD-10-CM

## 2015-01-29 LAB — POCT INR: INR: 2.1

## 2015-02-03 LAB — MDC_IDC_ENUM_SESS_TYPE_REMOTE

## 2015-02-05 ENCOUNTER — Ambulatory Visit: Payer: Self-pay | Attending: Internal Medicine | Admitting: Pharmacist

## 2015-02-05 DIAGNOSIS — I639 Cerebral infarction, unspecified: Secondary | ICD-10-CM

## 2015-02-05 LAB — POCT INR: INR: 3.7

## 2015-02-12 ENCOUNTER — Ambulatory Visit: Payer: Self-pay | Attending: Internal Medicine | Admitting: Pharmacist

## 2015-02-12 DIAGNOSIS — I639 Cerebral infarction, unspecified: Secondary | ICD-10-CM

## 2015-02-12 LAB — POCT INR: INR: 3.3

## 2015-02-19 ENCOUNTER — Ambulatory Visit: Payer: Self-pay | Attending: Internal Medicine | Admitting: Pharmacist

## 2015-02-19 DIAGNOSIS — I639 Cerebral infarction, unspecified: Secondary | ICD-10-CM

## 2015-02-19 LAB — POCT INR: INR: 3.7

## 2015-02-20 ENCOUNTER — Ambulatory Visit (INDEPENDENT_AMBULATORY_CARE_PROVIDER_SITE_OTHER): Payer: Self-pay | Admitting: *Deleted

## 2015-02-20 ENCOUNTER — Encounter: Payer: Self-pay | Admitting: Internal Medicine

## 2015-02-20 DIAGNOSIS — I639 Cerebral infarction, unspecified: Secondary | ICD-10-CM

## 2015-02-20 LAB — MDC_IDC_ENUM_SESS_TYPE_REMOTE

## 2015-02-26 ENCOUNTER — Emergency Department: Payer: Self-pay | Admitting: Emergency Medicine

## 2015-02-26 ENCOUNTER — Ambulatory Visit: Payer: Self-pay | Attending: Internal Medicine | Admitting: Pharmacist

## 2015-02-26 DIAGNOSIS — I639 Cerebral infarction, unspecified: Secondary | ICD-10-CM

## 2015-02-26 LAB — POCT INR: INR: 3.4

## 2015-02-26 NOTE — Progress Notes (Signed)
Loop recorder 

## 2015-03-05 ENCOUNTER — Telehealth: Payer: Self-pay

## 2015-03-05 NOTE — Telephone Encounter (Signed)
Returned patient phone call Patient missed her appointment this am and wanted  To reschedule Call transferred to the front office to schedule an appointment

## 2015-03-10 ENCOUNTER — Ambulatory Visit: Payer: Self-pay | Attending: Internal Medicine | Admitting: Pharmacist

## 2015-03-10 DIAGNOSIS — I639 Cerebral infarction, unspecified: Secondary | ICD-10-CM

## 2015-03-10 LAB — POCT INR: INR: 2.3

## 2015-03-17 ENCOUNTER — Ambulatory Visit: Payer: Self-pay | Attending: Internal Medicine | Admitting: Pharmacist

## 2015-03-17 DIAGNOSIS — I639 Cerebral infarction, unspecified: Secondary | ICD-10-CM

## 2015-03-17 LAB — POCT INR: INR: 3.3

## 2015-03-20 ENCOUNTER — Other Ambulatory Visit: Payer: Self-pay | Admitting: Oncology

## 2015-03-20 ENCOUNTER — Encounter: Payer: Self-pay | Admitting: Cardiology

## 2015-03-20 ENCOUNTER — Telehealth: Payer: Self-pay | Admitting: Oncology

## 2015-03-20 NOTE — Telephone Encounter (Signed)
No answer to confirm appointment changed. Mailed calendar.

## 2015-03-23 ENCOUNTER — Ambulatory Visit (INDEPENDENT_AMBULATORY_CARE_PROVIDER_SITE_OTHER): Payer: BLUE CROSS/BLUE SHIELD | Admitting: *Deleted

## 2015-03-23 DIAGNOSIS — I639 Cerebral infarction, unspecified: Secondary | ICD-10-CM

## 2015-03-24 NOTE — Progress Notes (Signed)
Loop recorder 

## 2015-03-25 ENCOUNTER — Encounter: Payer: Self-pay | Admitting: Internal Medicine

## 2015-03-26 ENCOUNTER — Telehealth: Payer: Self-pay | Admitting: Internal Medicine

## 2015-03-26 ENCOUNTER — Ambulatory Visit: Payer: BLUE CROSS/BLUE SHIELD | Attending: Internal Medicine | Admitting: Pharmacist

## 2015-03-26 DIAGNOSIS — I639 Cerebral infarction, unspecified: Secondary | ICD-10-CM | POA: Diagnosis not present

## 2015-03-26 LAB — POCT INR: INR: 3

## 2015-03-26 MED ORDER — WARFARIN SODIUM 5 MG PO TABS: ORAL_TABLET | ORAL | Status: DC

## 2015-03-26 NOTE — Telephone Encounter (Signed)
Informed pt that she needs to send manual transmission.

## 2015-03-26 NOTE — Telephone Encounter (Signed)
New message      Pt has a loop recorder.  She got a letter stating we have not received a transmission.  She want to know what does that mean?  Please call

## 2015-04-09 ENCOUNTER — Ambulatory Visit: Payer: Self-pay | Attending: Internal Medicine | Admitting: Pharmacist

## 2015-04-09 DIAGNOSIS — I639 Cerebral infarction, unspecified: Secondary | ICD-10-CM

## 2015-04-09 LAB — POCT INR: INR: 1.9

## 2015-04-15 LAB — MDC_IDC_ENUM_SESS_TYPE_REMOTE
MDC IDC SESS DTM: 20160407194336
MDC IDC SET ZONE DETECTION INTERVAL: 3000 ms
Zone Setting Detection Interval: 2000 ms
Zone Setting Detection Interval: 330 ms

## 2015-04-17 ENCOUNTER — Other Ambulatory Visit (HOSPITAL_BASED_OUTPATIENT_CLINIC_OR_DEPARTMENT_OTHER): Payer: Self-pay

## 2015-04-17 DIAGNOSIS — D6859 Other primary thrombophilia: Secondary | ICD-10-CM

## 2015-04-17 DIAGNOSIS — Z130 Encounter for screening for diseases of the blood and blood-forming organs and certain disorders involving the immune mechanism: Secondary | ICD-10-CM

## 2015-04-17 LAB — CBC WITH DIFFERENTIAL/PLATELET
BASO%: 1.7 % (ref 0.0–2.0)
Basophils Absolute: 0.1 10*3/uL (ref 0.0–0.1)
EOS%: 6.2 % (ref 0.0–7.0)
Eosinophils Absolute: 0.3 10*3/uL (ref 0.0–0.5)
HCT: 32.1 % — ABNORMAL LOW (ref 34.8–46.6)
HGB: 10 g/dL — ABNORMAL LOW (ref 11.6–15.9)
LYMPH#: 1.8 10*3/uL (ref 0.9–3.3)
LYMPH%: 33.5 % (ref 14.0–49.7)
MCH: 20.5 pg — AB (ref 25.1–34.0)
MCHC: 31.1 g/dL — AB (ref 31.5–36.0)
MCV: 66 fL — AB (ref 79.5–101.0)
MONO#: 0.3 10*3/uL (ref 0.1–0.9)
MONO%: 6.3 % (ref 0.0–14.0)
NEUT%: 52.3 % (ref 38.4–76.8)
NEUTROS ABS: 2.8 10*3/uL (ref 1.5–6.5)
Platelets: 274 10*3/uL (ref 145–400)
RBC: 4.87 10*6/uL (ref 3.70–5.45)
RDW: 18.2 % — AB (ref 11.2–14.5)
WBC: 5.3 10*3/uL (ref 3.9–10.3)

## 2015-04-22 ENCOUNTER — Ambulatory Visit (INDEPENDENT_AMBULATORY_CARE_PROVIDER_SITE_OTHER): Payer: Self-pay | Admitting: *Deleted

## 2015-04-22 DIAGNOSIS — I639 Cerebral infarction, unspecified: Secondary | ICD-10-CM

## 2015-04-22 LAB — BETA-2 GLYCOPROTEIN ANTIBODIES
BETA-2-GLYCOPROTEIN I IGM: 5 M Units (ref <20)
Beta-2 Glyco I IgG: 6 G Units (ref <20)
Beta-2-Glycoprotein I IgA: 7 A Units (ref <20)

## 2015-04-22 LAB — LUPUS ANTICOAGULANT PANEL
DRVVT: 40.8 secs (ref <42.9)
Lupus Anticoagulant: NOT DETECTED
PTT Lupus Anticoagulant: 53.7 secs — ABNORMAL HIGH (ref 28.0–43.0)
PTTLA 4:1 Mix: 44 secs — ABNORMAL HIGH (ref 28.0–43.0)
PTTLA CONFIRMATION: 4.3 s (ref <8.0)

## 2015-04-22 LAB — HEMOGLOBINOPATHY EVALUATION
HGB A2 QUANT: 2.8 % (ref 2.2–3.2)
HGB A: 65.8 % — AB (ref 96.8–97.8)
Hemoglobin Other: 0 %
Hgb F Quant: 0 % (ref 0.0–2.0)
Hgb S Quant: 31.4 % — ABNORMAL HIGH

## 2015-04-23 ENCOUNTER — Ambulatory Visit: Payer: Self-pay | Attending: Internal Medicine | Admitting: Pharmacist

## 2015-04-23 DIAGNOSIS — I639 Cerebral infarction, unspecified: Secondary | ICD-10-CM

## 2015-04-23 LAB — POCT INR: INR: 2.4

## 2015-05-01 ENCOUNTER — Ambulatory Visit: Payer: BC Managed Care – PPO | Admitting: Oncology

## 2015-05-01 ENCOUNTER — Encounter: Payer: Self-pay | Admitting: Internal Medicine

## 2015-05-01 ENCOUNTER — Ambulatory Visit (HOSPITAL_BASED_OUTPATIENT_CLINIC_OR_DEPARTMENT_OTHER): Payer: Self-pay | Admitting: Oncology

## 2015-05-01 VITALS — BP 143/75 | HR 72 | Temp 98.1°F | Resp 16 | Ht 65.0 in | Wt 300.0 lb

## 2015-05-01 DIAGNOSIS — D6861 Antiphospholipid syndrome: Secondary | ICD-10-CM

## 2015-05-01 NOTE — Progress Notes (Signed)
Hematology and Oncology Follow Up Visit  Mary [name] [id number] [birth year] 49 y.o. 05/01/2015 3:40 PM   Principle Diagnosis: 49 year old woman with a acute episode of arterial and venous thrombosis diagnosed in February 2014. She had a positive lupus anticoagulant and a positive antiphospholipid antibody syndrome.   Prior Therapy: She was treated on Xarelto for 3 months and therapy was discontinued.  Current therapy: Warfarin since November 2015.  Interim History:  Mary [name] presents today for a follow-up visit. As the last visit, she has not reported any major changes. Continue be on warfarin without any complications. She had not had any thrombosis or bleeding episodes. She continues to perform activities of daily living without any hindrance or decline.  She does not report any headaches, blurry vision, syncope or seizures. She does not report any fevers, chills, sweats, weight loss or appetite changes. She does not report any chest pain, palpitation or dyspnea on exertion. She does not report any cough, hemoptysis or wheezing. She does not report any nausea, vomiting, constipation, diarrhea or change in her bowel habits. She does not report any frequency urgency or hesitancy. She does not report any skeletal complaints. She does not report any bleeding complications such as epistaxis or hematochezia. Rest of her review of systems unremarkable.    Medications: I have reviewed the patient's current medications.  Current Outpatient Prescriptions  Medication Sig Dispense Refill  . amLODipine (NORVASC) 5 MG tablet Take 5 mg by mouth daily.    . metoprolol tartrate (LOPRESSOR) 25 MG tablet Take 1 tablet (25 mg total) by mouth 2 (two) times daily. 180 tablet 3  . tacrolimus (PROTOPIC) 0.1 % ointment Apply topically 2 (two) times daily.     Marland Kitchen triamcinolone cream (KENALOG) 0.1 % Apply 1 application topically as needed.     . warfarin (COUMADIN) 5 MG tablet Take as directed per coumadin  clinic 30 tablet 3   No current facility-administered medications for this visit.     Allergies: No Known Allergies  Past Medical History, Surgical history, Social history, and Family History were reviewed and updated.   Physical Exam: Blood pressure 143/75, pulse 72, temperature 98.1 F (36.7 C), temperature source Oral, resp. rate 16, height 5\' 5"  (1.651 m), weight 300 lb (136.079 kg), SpO2 97 %. ECOG: 0 General appearance: alert and cooperative Head: Normocephalic, without obvious abnormality Neck: no adenopathy Lymph nodes: Cervical, supraclavicular, and axillary nodes normal. Heart:regular rate and rhythm, S1, S2 normal, no murmur, click, rub or gallop Lung:chest clear, no wheezing, rales, normal symmetric air entry Abdomin: soft, non-tender, without masses or organomegaly EXT:no erythema, induration, or nodules   Lab Results: Lab Results  Component Value Date   WBC 5.3 04/17/2015   HGB 10.0* 04/17/2015   HCT 32.1* 04/17/2015   MCV 66.0* 04/17/2015   PLT 274 04/17/2015     Chemistry      Component Value Date/Time   NA 140 10/03/2014 1004   NA 137 05/30/2014 1025   K 3.6 10/03/2014 1004   K 4.0 05/30/2014 1025   CL 106 10/03/2014 1004   CL 103 05/30/2014 1025   CO2 29 10/03/2014 1004   CO2 25 05/30/2014 1025   BUN 9 10/03/2014 1004   BUN 12 05/30/2014 1025   CREATININE 0.81 10/03/2014 1004   CREATININE 0.76 05/30/2014 1025   CREATININE 0.73 01/29/2013 0627      Component Value Date/Time   CALCIUM 8.2* 10/03/2014 1004   CALCIUM 8.4 05/30/2014 1025   ALKPHOS 71 10/07/2014 1109  ALKPHOS 76 10/03/2014 1004   AST 17 10/07/2014 1109   AST 28 10/03/2014 1004   ALT 16 10/07/2014 1109   ALT 25 10/03/2014 1004   BILITOT 0.3 10/07/2014 1109      Results for OZZIE, REMMERS (MRN [id number]) as of 05/01/2015 15:18  Ref. Range 04/17/2015 09:48  HGB A Latest Ref Range: 96.8-97.8 % 65.8 (L)  Hgb A2 Quant Latest Ref Range: 2.2-3.2 % 2.8  Hgb F Quant Latest Ref  Range: 0.0-2.0 % 0.0  Hgb S Quant Latest Ref Range: 0.0 % 31.4 (H)  Hemoglobin Other Latest Ref Range: 0.0 % 0.0  PTT Lupus Anticoagulant Latest Ref Range: 28.0-43.0 secs 53.7 (H)  PTTLA Confirmation Latest Ref Range: <8.0 secs 4.3  PTTLA 4:1 Mix Latest Ref Range: 28.0-43.0 secs 44.0 (H)  DRVVT Latest Ref Range: <42.9 secs 40.8  Drvvt confirmation Latest Ref Range: <1.15 Ratio NOT APPL  Lupus Anticoagulant Latest Ref Range: NOT DETECTED  NOT DETECTED  Beta-2 Glyco I IgG Latest Ref Range: <20 G Units 6  Beta-2-Glycoprotein I IgA Latest Ref Range: <20 A Units 7  Beta-2-Glycoprotein I IgM Latest Ref Range: <20 M Units 5     Impression and Plan:  49 year old woman with the following issues:  1. History of venous and arterial thrombosis diagnosed in 2014. She did have a positive lupus anticoagulant at that time which suggest the possibility of antiphospholipid antibody syndrome. She has been chronically anticoagulated intermittently most recently since November 2015. The laboratory testing does not show a positive lupus anticoagulant at this time with a negative beta-2 glycoprotein as well.  The risks and benefits of stopping anticoagulation versus continuation were discussed. She feels uncomfortable, and off anticoagulation given the devastating nature of her previous blood clot.  I feel it is reasonable to continue anticoagulation indefinitely and I do not think she needs an antiplatelet agent at this time.  2. History of sickle cell trait: Hemoglobin electrophoresis showed a slightly increased hemoglobin S. Her total hemoglobin is close to normal range and no intervention is needed at this time. She has not really had any sickle cell crisis previously and no any specific intervention is needed.  All her questions were answered today to her satisfaction. I'll be happy to see her in the future as needed.    Zola Button, MD 5/13/20163:40 PM

## 2015-05-01 NOTE — Progress Notes (Signed)
Loop recorder 

## 2015-05-14 ENCOUNTER — Ambulatory Visit: Payer: Self-pay | Attending: Internal Medicine | Admitting: Pharmacist

## 2015-05-14 DIAGNOSIS — I639 Cerebral infarction, unspecified: Secondary | ICD-10-CM

## 2015-05-14 LAB — CUP PACEART REMOTE DEVICE CHECK: Date Time Interrogation Session: 20160526121018

## 2015-05-14 LAB — POCT INR: INR: 2.9

## 2015-05-22 ENCOUNTER — Ambulatory Visit (INDEPENDENT_AMBULATORY_CARE_PROVIDER_SITE_OTHER): Payer: Self-pay | Admitting: *Deleted

## 2015-05-22 DIAGNOSIS — I639 Cerebral infarction, unspecified: Secondary | ICD-10-CM

## 2015-05-27 NOTE — Progress Notes (Signed)
Loop recorder 

## 2015-06-04 LAB — CUP PACEART REMOTE DEVICE CHECK: Date Time Interrogation Session: 20160616115228

## 2015-06-10 ENCOUNTER — Ambulatory Visit: Payer: Self-pay

## 2015-06-11 ENCOUNTER — Ambulatory Visit: Payer: Self-pay | Attending: Internal Medicine

## 2015-06-11 ENCOUNTER — Ambulatory Visit (HOSPITAL_BASED_OUTPATIENT_CLINIC_OR_DEPARTMENT_OTHER): Payer: Self-pay | Admitting: Pharmacist

## 2015-06-11 DIAGNOSIS — I639 Cerebral infarction, unspecified: Secondary | ICD-10-CM

## 2015-06-11 LAB — POCT INR: INR: 1.8

## 2015-06-11 MED ORDER — WARFARIN SODIUM 5 MG PO TABS: ORAL_TABLET | ORAL | Status: DC

## 2015-06-16 ENCOUNTER — Encounter: Payer: Self-pay | Admitting: Internal Medicine

## 2015-06-19 ENCOUNTER — Ambulatory Visit (INDEPENDENT_AMBULATORY_CARE_PROVIDER_SITE_OTHER): Payer: Self-pay | Admitting: *Deleted

## 2015-06-19 DIAGNOSIS — I639 Cerebral infarction, unspecified: Secondary | ICD-10-CM

## 2015-06-24 LAB — CUP PACEART REMOTE DEVICE CHECK: Date Time Interrogation Session: 20160706135310

## 2015-06-24 NOTE — Progress Notes (Signed)
Loop recorder 

## 2015-07-07 ENCOUNTER — Encounter: Payer: Self-pay | Admitting: Internal Medicine

## 2015-07-08 ENCOUNTER — Encounter: Payer: Self-pay | Admitting: Internal Medicine

## 2015-07-09 ENCOUNTER — Encounter: Payer: Self-pay | Admitting: Pharmacist

## 2015-07-16 ENCOUNTER — Ambulatory Visit: Payer: Self-pay | Attending: Internal Medicine | Admitting: Pharmacist

## 2015-07-16 ENCOUNTER — Ambulatory Visit (INDEPENDENT_AMBULATORY_CARE_PROVIDER_SITE_OTHER): Payer: Self-pay | Admitting: Neurology

## 2015-07-16 ENCOUNTER — Encounter: Payer: Self-pay | Admitting: Neurology

## 2015-07-16 VITALS — BP 119/80 | HR 77 | Ht 65.0 in | Wt 299.0 lb

## 2015-07-16 DIAGNOSIS — E669 Obesity, unspecified: Secondary | ICD-10-CM

## 2015-07-16 DIAGNOSIS — I10 Essential (primary) hypertension: Secondary | ICD-10-CM

## 2015-07-16 DIAGNOSIS — I63412 Cerebral infarction due to embolism of left middle cerebral artery: Secondary | ICD-10-CM | POA: Insufficient documentation

## 2015-07-16 DIAGNOSIS — IMO0002 Reserved for concepts with insufficient information to code with codable children: Secondary | ICD-10-CM

## 2015-07-16 DIAGNOSIS — I69320 Aphasia following cerebral infarction: Secondary | ICD-10-CM

## 2015-07-16 DIAGNOSIS — D6861 Antiphospholipid syndrome: Secondary | ICD-10-CM

## 2015-07-16 DIAGNOSIS — I639 Cerebral infarction, unspecified: Secondary | ICD-10-CM

## 2015-07-16 LAB — POCT INR: INR: 1.5

## 2015-07-16 NOTE — Progress Notes (Signed)
Patient missed a dose about 4-5 days ago.  Typically stable at this dose. Will increase tonight (loading dose) and then resume normal dosing.  Patient with visit in clinic August 3rd - will obtain next INR at that time.

## 2015-07-16 NOTE — Patient Instructions (Signed)
-   continue coumadin for APL syndrome - INR goal 2.5-3.5. Follow up with coumadin clinic - Follow up with your primary care physician for stroke risk factor modification. Recommend maintain blood pressure goal <130/80, diabetes with hemoglobin A1c goal below 6.5% and lipids with LDL cholesterol goal below 70 mg/dL.  - check BP at home. - make an appointment with Dr. Annitta Needs regarding referral to psychologist for counseling.  - regular exercise and weight loss - follow up in 6 months.

## 2015-07-16 NOTE — Progress Notes (Signed)
STROKE NEUROLOGY FOLLOW UP NOTE  NAME: Mary [name] DOB: 1966/06/25  REASON FOR VISIT: stroke follow up HISTORY FROM: pt and chart  Today we had the pleasure of seeing Mary [name] in follow-up at our Neurology Clinic. Pt was accompanied by no one.   History Summary Mary [name] is a 49 year old lady with PMH of HTN and obesity was admitted for stroke on 01/23/13. She presented with several days history of slurred speech and expressive aphasia. Her symptoms actually began after an ovarian biopsy and she initially came to the ER and was discharged home but symptoms persisted and a few days later she returned and CT scan of the head this time showed a subacute left parietal MCA branch infarct. CT angiogram of the brain showed occluded distal branch of left middle cerebral artery corresponding to the infarct territory. CT angiogram of the neck showed no significant extracranial stenosis. MRI of the brain could not be obtained due to patient's obesity as well as severe claustrophobia. Transthoracic echo showed normal ejection fraction. Transesophageal echocardiogram showed no patent foramen ovale, vegetation or clot. EKG and telemetry monitoring did not reveal and cardiac arrhythmias. ESR,complements. ANA panel, RPR, HIV, antithrombin III and homocystine were all normal. The patient had significant expressive aphasia and was seen by physical, occupational and speech therapy and who was found to have deep vein thrombosis and hence was started on anticoagulation with xarelto. She was discharged and has continued to have outpatient speech therapy and has obtained some modest improvement in her speech. She still has significant word hesitancy but is able to communicate. She complains of mild headaches every other day for which she takes Tylenol which seemed to work quite well. She works as a Freight forwarder at [employer] and states she will be unable to perform her job in her current situation.   Follow up  04/15/13 - She reports no significant change in her expressive aphasia. She has no new complaints today. She still not working and has questions about when can she drive. TCD bubble study was negative.   Follow up 10/14/13 (LL): she comes back for stroke revisit. She is doing well, PCP changed her from Norvasc to Metoprolol because she was having frequent headaches, which has helped. She stopped Nortriptyline due to lethargy and it was not helping her headaches. She may need to restart Norvasc though, because BP is elevated today, 162/98. She is still in speech therapy .   Follow up 04/18/14 (LL): Since last visit, she returned to working part-time at [employer] in an different job role. She is very frustrated that she is not able to function in her previous capacity. She continues with ST twice weekly. Her blood pressure is better controlled on Norvasc and Metoprolol, though elevated in the office today at 155/93. She states her headaches are less frequent.   Follow up 10/07/14 - the patient has been doing OK. She is on norvac and metoprolol for BP control and today is 123/80. However, she stated that last Friday, she was working in [place], she felt funny feeling, not able to do what she was able to do before, she kept asking her co-workers, how to do this, how to do that, she has some difficulty understanding and express herself. She was thinking and talking slow, she felt not herself. On Saturday, she felt fine. She has not back to work yet. She went to her PCP and was told to be off work for a week and follow up  with neurology.   She was started on Xarelto for DVT in 01/2013 but stopped after 3 months. Suppose on ASA 16m as per chart, but currently she is not on any antiplatelet. She also had loop recorder placed in 05/2014 by Dr. ARayann Hemanand so far no Afib episodes found. However, her hypercoagulable work up initially showed positive lupus coagulant, no repeat done yet.  Follow up 11/17/14 - she had EEG  which did not show seizure like activity but left temporal slowing which is consistent with her stroke in the past. Her repeat lab test showed positive sickle cell screening and again positive lupus anticoagulant panel with high hexagonal phospholipid Neutral. She has not seen hematology yet. Her loop recorder so far is negative for AF episodes. She stated that she continued to have intermittent episodes with word finding difficulties, difficulty with comprehension and confusion at work, especially when she feels tired and stressed out, but she stated that she gradually is getting better than before. BP today 143/89.  Follow up 01/15/15 - she was doing the same. She still has some intermittent confusion and word finding difficulties but she has got used to it. She followed up with PCP and oncologist Dr. SAlen Blewand considered antiphospholipid syndrome and agreed on anticoagulation with coumadin. INR goal 2.5-3.5. Her INR fluctuates and currently following with coumadin clinic. INR last check 2.0. BP 143/89. Loop recorder continued to have no AF episodes.  Interval History During the interval time, no Afib episode on loop recorder. Last INR check one month ago was 1.8. She was doing well without stroke like symptoms. She still has some slow to reaction, some difficulty with language output but that is no change from before. She saw Dr. SAlen Blewin 04/2015 and pt would like to continue coumadin for stroke prevention. She has not seen PCP for a while. She is requesting psychology for counseling. BP today is good 119/80.  REVIEW OF SYSTEMS: Full 14 system review of systems performed and notable only for those listed below and in HPI above, all others are negative:  Constitutional: fatigue  Cardiovascular: N/A  Ear/Nose/Throat: N/A  Skin: N/A  Eyes: N/A  Respiratory: N/A  Gastroitestinal: N/A  Genitourinary: N/A Hematology/Lymphatic: N/A  Endocrine: N/A  Musculoskeletal: N/A  Allergy/Immunology: N/A    Neurological: N/A Psychiatric: N/A  The following represents the patient's updated allergies and side effects list: No Known Allergies  The neurologically relevant items on the patient's problem list were reviewed on today's visit.  Neurologic Examination  A problem focused neurological exam (12 or more points of the single system neurologic examination, vital signs counts as 1 point, cranial nerves count for 8 points) was performed.  Blood pressure 119/80, pulse 77, height _0  (1.651 m), weight 299 lb (135.626 kg).  General - Well nourished, well developed, in no apparent distress.  Ophthalmologic - not able to see through  Cardiovascular - Regular rate and rhythm with no murmur.  Mental Status -  Level of arousal and orientation to time, place, and person were intact. Language including expression, naming, repetition, comprehension was assessed and found intact.  Cranial Nerves II - XII - II - Visual field intact OU. III, IV, VI - Extraocular movements intact. V - Facial sensation intact bilaterally. VII - Facial movement intact bilaterally. VIII - Hearing & vestibular intact bilaterally. X - Palate elevates symmetrically. XI - Chin turning & shoulder shrug intact bilaterally. XII - Tongue protrusion intact.  Motor Strength - The patient's strength was normal in all  extremities and pronator drift was absent.  Bulk was normal and fasciculations were absent.   Motor Tone - Muscle tone was assessed at the neck and appendages and was normal.  Reflexes - The patient's reflexes were normal in all extremities and she had no pathological reflexes.  Sensory - Light touch, temperature/pinprick, vibration and proprioception, and Romberg testing were assessed and were normal.    Coordination - The patient had normal movements in the hands and feet with no ataxia or dysmetria.  Tremor was absent.  Gait and Station - The patient's transfers, posture, gait, station, and turns were  observed as normal.  Data reviewed: I personally reviewed the images and agree with the radiology interpretations.  01/23/13 CT head 1. Early subacute left parietal lobe infarct.  2. No evidence of intracranial hemorrhage, mass effect, or midline shift.  3. Chronic small vessel disease and old deep white matter infarcts are seen bilaterally  01/24/14 CTA head and neck 1. No significant stenosis of the cervical vasculature.  2. Mild atherosclerotic irregularity of the left carotid bifurcation without significant stenosis 3. Occluded distal left MCA branch vessel corresponding to the  infarct territory.  4. Moderate small vessel disease.  5. Mild atherosclerotic irregularity within the cavernous carotid  arteries bilaterally without significant stenosis.  TCD bubble study - 04/26/13 No R to L shunt, negative bubble study.  EEG 10/22/14 - This is an abnormal EEG recording secondary to intermittent theta slowing emanating from the left temporal region. This slowing may correlate with the prior stroke event, and no clear epileptiform discharges were seen during the study. Clinical correlation is required.  Component     Latest Ref Rng 01/24/2013 05/30/2014 10/07/2014  Prothrombin Time        13.7  INR        1.1  APTT        40.1 (H)  APTT 1:1 NP        33.7  APTT 1:1 Saline        50.7  THROMBIN TIME        16.7  DRVVT Screen Seconds        42.6  DRVVT Confirm Seconds        CANCELED  DRVVT Ratio        CANCELED  Hexagonal Phospholipid Neutral        9 (H)  Platelet Neutralization        5.7 (H)  Anticardiolipin Ab, IgG        CANCELED  Anticardiolipin Ab, IgM        CANCELED  Beta-2 Glycoprotein I, IgG        CANCELED  Beta-2 Glycoprotein I, IgM        CANCELED  Beta-2 Glycoprotein I, IgA        CANCELED  LAC Interpretation        Comment  PTT Lupus Anticoagulant     28.0 - 43.0 secs 44.3 (H)    PTTLA Confirmation     <8.0 secs 11.5 (H)    PTTLA 4:1 Mix     28.0 -  43.0 secs 45.6 (H)    DRVVT     <42.9 secs 38.8    Drvvt confirmation     <1.11 Ratio NOT APPL    dRVVT Incubated 1:1 Mix     <42.9 secs NOT APPL    Lupus Anticoagulant     NOT DETECTED DETECTED (A)    Total Protein     6.0 - 8.5 g/dL  7.9  Albumin     3.5 - 5.5 g/dL   3.9  Total Bilirubin     0.0 - 1.2 mg/dL   0.3  Bilirubin, Direct     0.00 - 0.40 mg/dL   0.10  Alkaline Phosphatase     39 - 117 IU/L   71  AST     0 - 40 IU/L   17  ALT     0 - 32 IU/L   16  ENA RNP Ab     0.0 - 0.9 AI   0.3  ENA SM Ab Ser-aCnc     0.0 - 0.9 AI   <0.2  Rhuematoid fact SerPl-aCnc     0.0 - 13.9 IU/mL   11.0  Chromatin Ab SerPl-aCnc     0.0 - 0.9 AI   <0.2  ENA SSA (RO) Ab     0.0 - 0.9 AI   <0.2  ENA SSB (LA) Ab     0.0 - 0.9 AI   <0.2  dsDNA Ab     0 - 9 IU/mL   1  Cholesterol     0 - 200 mg/dL  126   Triglycerides     <150 mg/dL  91   HDL     >39 mg/dL  40   Total CHOL/HDL Ratio       3.2   VLDL     0 - 40 mg/dL  18   LDL (calc)     0 - 99 mg/dL  68   Anticardiolipin IgG     0 - 14 GPL U/mL 4 (L)  <9  Anticardiolipin IgM     0 - 12 MPL U/mL 3 (L)  <9  Anticardiolipin IgA     <22 APL U/mL 7 (L)    C-ANCA     Neg:<1:20 titer   <1:20  P-ANCA     Neg:<1:20 titer   <1:20  Atypical pANCA     Neg:<1:20 titer   <1:20  Beta-2 Glyco I IgG     0 - 20 GPI IgG units   <9  Beta-2 Glyco 1 IgA     0 - 25 GPI IgA units   31 (H)  Beta-2 Glyco 1 IgM     0 - 32 GPI IgM units   <9  Hgb A1c MFr Bld     4.8 - 5.6 %   6.3 (H)  Est. average glucose Bld gHb Est-mCnc        134  C3 Complement     90 - 180 mg/dL 160    Complement C4, Body Fluid     10 - 40 mg/dL 34    ANA Ser Ql     Negative NEGATIVE  Negative  RPR     NON REACTIVE NON REACTIVE    HIV     NON REACTIVE NON REACTIVE    AntiThromb III Func     75 - 120 % 107    Protein C Activity     74 - 151 % 153 (H)  144  Protein C, Total     72 - 160 % 88    Protein S Activity     60 - 145 % 55 (L)  66  Protein S Ag,  Total     60 - 150 % 66    Homocysteine     4.0 - 15.4 umol/L 8.1    TSH     0.350 - 4.500 uIU/mL  1.903  Sickle Cell Prep     Negative   Positive (A)  Homocysteine     0.0 - 15.0 umol/L   9.9   Component     Latest Ref Rng 04/17/2015  PTT Lupus Anticoagulant     28.0 - 43.0 secs 53.7 (H)  PTTLA 4:1 Mix     28.0 - 43.0 secs 44.0 (H)  PTTLA Confirmation     <8.0 secs 4.3  DRVVT     <42.9 secs 40.8  DRVVT 1:1 Mix     <42.9 secs NOT APPL  Drvvt confirmation     <1.15 Ratio NOT APPL  Lupus Anticoagulant     NOT DETECTED NOT DETECTED  HGB A     96.8 - 97.8 % 65.8 (L)  Hgb A2 Quant     2.2 - 3.2 % 2.8  Hgb F Quant     0.0 - 2.0 % 0.0  Hgb S Quant     0.0 % 31.4 (H)  Hemoglobin Other     0.0 % 0.0  Beta-2 Glyco I IgG     <20 G Units 6  Beta-2-Glycoprotein I IgM     <20 M Units 5  Beta-2-Glycoprotein I IgA     <20 A Units 7    Assessment: As you may recall, she is a 49 y.o. African American female with PMH of HTN and obesity was admitted on 01/2013 for left MCA stroke. Head CT showed bilateral old WM infarcts too. She was found to have DVT and put on Xarelto which was stopped after 3 months. Hypercoagulable work up questions for antiphospholipid antibody syndrome. Repeat hypercoagulable work up again showed positive for lupus anticoagulant panel as well as positive sickle cell screening.  According to her history, young, female, with multiple arterial and venous thrombosis with positive anticoagulant tests x 2, antiphospholipid antibody syndrome is considered until proven otherwise. She also saw Dr. Alen Blew for APL and positive sickle cell screening, Dr. Alen Blew agreed on anticoagulation with coumadin with INR 2.5-3.5. Currently she is following with coumadin clinic. Her recent INR was low. She followed with Dr. Alen Blew and continued on coumadin. Sickle cell trait no need intervention. Pt requests psychology for counseling.  Plan:  - continue coumadin for APL syndrome - INR  goal 2.5-3.5. Follow up with coumadin clinic - Follow up with your primary care physician for stroke risk factor modification. Recommend maintain blood pressure goal <130/80, diabetes with hemoglobin A1c goal below 6.5% and lipids with LDL cholesterol goal below 70 mg/dL.  - contact PCP for psychology referral. - check BP at home. - regular exercise and weight loss - RTC in 6 months.  No orders of the defined types were placed in this encounter.    No orders of the defined types were placed in this encounter.    Patient Instructions  - continue coumadin for APL syndrome - INR goal 2.5-3.5. Follow up with coumadin clinic - Follow up with your primary care physician for stroke risk factor modification. Recommend maintain blood pressure goal <130/80, diabetes with hemoglobin A1c goal below 6.5% and lipids with LDL cholesterol goal below 70 mg/dL.  - check BP at home. - make an appointment with Dr. Annitta Needs regarding referral to psychologist for counseling.  - regular exercise and weight loss - follow up in 6 months.    Rosalin Hawking, MD PhD [facility] [address] [place], [place] [address] [phone]

## 2015-07-21 ENCOUNTER — Ambulatory Visit (INDEPENDENT_AMBULATORY_CARE_PROVIDER_SITE_OTHER): Payer: Self-pay | Admitting: *Deleted

## 2015-07-21 DIAGNOSIS — I639 Cerebral infarction, unspecified: Secondary | ICD-10-CM

## 2015-07-22 ENCOUNTER — Encounter: Payer: Self-pay | Admitting: Clinical

## 2015-07-22 ENCOUNTER — Ambulatory Visit: Payer: Medicare Other | Attending: Internal Medicine | Admitting: Internal Medicine

## 2015-07-22 ENCOUNTER — Encounter: Payer: Self-pay | Admitting: Internal Medicine

## 2015-07-22 VITALS — BP 121/77 | HR 74 | Temp 98.0°F | Resp 16 | Wt 300.0 lb

## 2015-07-22 DIAGNOSIS — Z79899 Other long term (current) drug therapy: Secondary | ICD-10-CM | POA: Diagnosis not present

## 2015-07-22 DIAGNOSIS — D6862 Lupus anticoagulant syndrome: Secondary | ICD-10-CM | POA: Diagnosis not present

## 2015-07-22 DIAGNOSIS — I10 Essential (primary) hypertension: Secondary | ICD-10-CM

## 2015-07-22 DIAGNOSIS — R7309 Other abnormal glucose: Secondary | ICD-10-CM | POA: Diagnosis not present

## 2015-07-22 DIAGNOSIS — Z8673 Personal history of transient ischemic attack (TIA), and cerebral infarction without residual deficits: Secondary | ICD-10-CM

## 2015-07-22 DIAGNOSIS — I8290 Acute embolism and thrombosis of unspecified vein: Secondary | ICD-10-CM

## 2015-07-22 DIAGNOSIS — R7303 Prediabetes: Secondary | ICD-10-CM

## 2015-07-22 DIAGNOSIS — Z7901 Long term (current) use of anticoagulants: Secondary | ICD-10-CM | POA: Diagnosis not present

## 2015-07-22 LAB — COMPLETE METABOLIC PANEL WITH GFR
ALT: 13 U/L (ref 6–29)
AST: 16 U/L (ref 10–35)
Albumin: 3.3 g/dL — ABNORMAL LOW (ref 3.6–5.1)
Alkaline Phosphatase: 60 U/L (ref 33–115)
BILIRUBIN TOTAL: 0.3 mg/dL (ref 0.2–1.2)
BUN: 10 mg/dL (ref 7–25)
CHLORIDE: 105 mmol/L (ref 98–110)
CO2: 26 mmol/L (ref 20–31)
Calcium: 8.5 mg/dL — ABNORMAL LOW (ref 8.6–10.2)
Creat: 0.82 mg/dL (ref 0.50–1.10)
GFR, Est African American: >89 mL/min (ref >=60)
GFR, Est Non African American: 84 mL/min (ref >=60)
GLUCOSE: 92 mg/dL (ref 65–99)
Potassium: 3.9 mmol/L (ref 3.5–5.3)
SODIUM: 141 mmol/L (ref 135–146)
TOTAL PROTEIN: 7.6 g/dL (ref 6.1–8.1)

## 2015-07-22 LAB — LIPID PANEL
CHOL/HDL RATIO: 3.6 ratio (ref <=5.0)
CHOLESTEROL: 128 mg/dL (ref 125–200)
HDL: 36 mg/dL — ABNORMAL LOW (ref >=46)
LDL Cholesterol: 65 mg/dL (ref <130)
Triglycerides: 133 mg/dL (ref <150)
VLDL: 27 mg/dL (ref <30)

## 2015-07-22 LAB — POCT INR: INR: 2.5

## 2015-07-22 LAB — HEMOGLOBIN A1C
Hgb A1c MFr Bld: 6.6 % — ABNORMAL HIGH (ref <5.7)
Mean Plasma Glucose: 143 mg/dL — ABNORMAL HIGH (ref <117)

## 2015-07-22 NOTE — Progress Notes (Unsigned)
ASSESSMENT: *** Stage of Change: active  PLAN: 1. F/U with behavioral health consultant in as needed 2. Psychiatric Medications: n/a. 3. Behavioral recommendation(s):   -Drink at least 3 glasses of water daily -Consider reading educational materials regarding coping with symptoms of depression SUBJECTIVE: Pt. referred by Dr Annitta Needs for symptoms of depression:  Pt. reports the following symptoms/concerns: Pt states that her life is not the same after having a stroke three years ago, and she does not know how to cope with the changes.  Duration of problem: 3 years Severity: moderate  OBJECTIVE: Orientation & Cognition: Oriented x3. Thought processes normal and appropriate to situation. Mood: appropriate. Affect: appropriate Appearance: appropriate Risk of harm to self or others: no risk of harm to self today (not suicidal today), no risk of harm to others Substance use: none Assessments administered: PHQ9- 12/ GAD7-9  Diagnosis: Depression due to stroke CPT Code: F32.9 -------------------------------------------- Other(s) present in the room: none  Time spent with patient in exam room: 20 minutes

## 2015-07-22 NOTE — Progress Notes (Signed)
MRN: [id number] Name: ZAIAH CREDEUR  Sex: female Age: 49 y.o. DOB: 1966/07/08  Allergies: Review of patient's allergies indicates no known allergies.  Chief Complaint  Patient presents with  . Follow-up    HPI: Patient is 49 y.o. female who has history of stroke, hypertension, lupus anticoagulant positive currently patient is on long-term Coumadin, today her INR is therapeutic denies any bleeding, her blood pressure is well controlled, she also history of prediabetes, today she is requesting to see a counselor sometimes she get some depressive symptoms denies any SI or HI. Patient will be scheduled to see our social worker to help with her needs.  Past Medical History  Diagnosis Date  . Hypertension   . Other and unspecified ovarian cysts   . Stroke     Past Surgical History  Procedure Laterality Date  . Ovarian cyst removal    . Tee without cardioversion N/A 01/25/2013    Procedure: TRANSESOPHAGEAL ECHOCARDIOGRAM (TEE);  Surgeon: Birdie Riddle, MD;  Location: [place];  Service: Cardiovascular;  Laterality: N/A;  . Loop recorder implant N/A 04/25/2014    Procedure: LOOP RECORDER IMPLANT;  Surgeon: Coralyn Mark, MD;  Location: [facility] CATH LAB;  Service: Cardiovascular;  Laterality: N/A;      Medication List       This list is accurate as of: 07/22/15 10:28 AM.  Always use your most recent med list.               amLODipine 5 MG tablet  Commonly known as:  NORVASC  Take 5 mg by mouth daily.     metoprolol tartrate 25 MG tablet  Commonly known as:  LOPRESSOR  Take 1 tablet (25 mg total) by mouth 2 (two) times daily.     tacrolimus 0.1 % ointment  Commonly known as:  PROTOPIC  Apply topically 2 (two) times daily.     triamcinolone cream 0.1 %  Commonly known as:  KENALOG  Apply 1 application topically as needed.     warfarin 5 MG tablet  Commonly known as:  COUMADIN  Take as directed per coumadin clinic        No orders of the defined types were placed  in this encounter.     There is no immunization history on file for this patient.  Family History  Problem Relation Age of Onset  . Diabetes Mellitus II Mother   . Diabetes Mother   . Stroke Other   . Diabetes Other   . Transient ischemic attack Father   . Diabetes Sister     History  Substance Use Topics  . Smoking status: Never Smoker   . Smokeless tobacco: Never Used  . Alcohol Use: No    Review of Systems   As noted in HPI  Filed Vitals:   07/22/15 0919  BP: 121/77  Pulse: 74  Temp: 98 F (36.7 C)  Resp: 16    Physical Exam  Physical Exam  Constitutional: No distress.  Eyes: EOM are normal. Pupils are equal, round, and reactive to light.  Cardiovascular: Normal rate and regular rhythm.   Pulmonary/Chest: Breath sounds normal. No respiratory distress. She has no wheezes. She has no rales.  Musculoskeletal: She exhibits no edema.    Labs   Lab Results  Component Value Date   WBC 5.3 04/17/2015   HGB 10.0* 04/17/2015   HCT 32.1* 04/17/2015   PLT 274 04/17/2015   GLUCOSE 100* 10/03/2014   CHOL 126 05/30/2014  TRIG 91 05/30/2014   HDL 40 05/30/2014   LDLCALC 68 05/30/2014   ALT 16 10/07/2014   AST 17 10/07/2014   NA 140 10/03/2014   K 3.6 10/03/2014   CL 106 10/03/2014   CREATININE 0.81 10/03/2014   BUN 9 10/03/2014   CO2 29 10/03/2014   TSH 1.903 05/30/2014   INR 2.50 07/22/2015   HGBA1C 6.3* 10/07/2014    Lab Results  Component Value Date   HGBA1C 6.3* 10/07/2014   HGBA1C 6.1* 01/24/2013     Assessment and Plan  Essential hypertension - Plan:blood pressure is well-controlled continued current meds. COMPLETE METABOLIC PANEL WITH GFR  Pre-diabetes - Plan: advised patient for low carbohydrate, repeat hemoglobin A1c   Lupus anticoagulant positive  - Plan:  Results for orders placed or performed in visit on 07/22/15  INR  Result Value Ref Range   INR 2.50    And has therapeutic continue with current dose of Coumadin.  H/O:  stroke - Plan: Currently patient is on Coumadin,Hemoglobin A1c, Lipid panel, ? patient is currently not  Taking any statins   Return in about 3 months (around 10/22/2015).   This note has been created with Surveyor, quantity. Any transcriptional errors are unintentional.    Lorayne Marek, MD

## 2015-07-22 NOTE — Progress Notes (Signed)
Patient here for follow up Patient states since her stroke she is not depressed  But feels like she needs To talk to someone Requesting a referral to a counselor

## 2015-07-22 NOTE — Patient Instructions (Addendum)
DASH Eating Plan DASH stands for "Dietary Approaches to Stop Hypertension." The DASH eating plan is a healthy eating plan that has been shown to reduce high blood pressure (hypertension). Additional health benefits may include reducing the risk of type 2 diabetes mellitus, heart disease, and stroke. The DASH eating plan may also help with weight loss. WHAT DO I NEED TO KNOW ABOUT THE DASH EATING PLAN? For the DASH eating plan, you will follow these general guidelines:  Choose foods with a percent daily value for sodium of less than 5% (as listed on the food label).  Use salt-free seasonings or herbs instead of table salt or sea salt.  Check with your health care provider or pharmacist before using salt substitutes.  Eat lower-sodium products, often labeled as "lower sodium" or "no salt added."  Eat fresh foods.  Eat more vegetables, fruits, and low-fat dairy products.  Choose whole grains. Look for the word "whole" as the first word in the ingredient list.  Choose fish and skinless chicken or turkey more often than red meat. Limit fish, poultry, and meat to 6 oz (170 g) each day.  Limit sweets, desserts, sugars, and sugary drinks.  Choose heart-healthy fats.  Limit cheese to 1 oz (28 g) per day.  Eat more home-cooked food and less restaurant, buffet, and fast food.  Limit fried foods.  Cook foods using methods other than frying.  Limit canned vegetables. If you do use them, rinse them well to decrease the sodium.  When eating at a restaurant, ask that your food be prepared with less salt, or no salt if possible. WHAT FOODS CAN I EAT? Seek help from a dietitian for individual calorie needs. Grains Whole grain or whole wheat bread. Brown rice. Whole grain or whole wheat pasta. Quinoa, bulgur, and whole grain cereals. Low-sodium cereals. Corn or whole wheat flour tortillas. Whole grain cornbread. Whole grain crackers. Low-sodium crackers. Vegetables Fresh or frozen vegetables  (raw, steamed, roasted, or grilled). Low-sodium or reduced-sodium tomato and vegetable juices. Low-sodium or reduced-sodium tomato sauce and paste. Low-sodium or reduced-sodium canned vegetables.  Fruits All fresh, canned (in natural juice), or frozen fruits. Meat and Other Protein Products Ground beef (85% or leaner), grass-fed beef, or beef trimmed of fat. Skinless chicken or turkey. Ground chicken or turkey. Pork trimmed of fat. All fish and seafood. Eggs. Dried beans, peas, or lentils. Unsalted nuts and seeds. Unsalted canned beans. Dairy Low-fat dairy products, such as skim or 1% milk, 2% or reduced-fat cheeses, low-fat ricotta or cottage cheese, or plain low-fat yogurt. Low-sodium or reduced-sodium cheeses. Fats and Oils Tub margarines without trans fats. Light or reduced-fat mayonnaise and salad dressings (reduced sodium). Avocado. Safflower, olive, or canola oils. Natural peanut or almond butter. Other Unsalted popcorn and pretzels. The items listed above may not be a complete list of recommended foods or beverages. Contact your dietitian for more options. WHAT FOODS ARE NOT RECOMMENDED? Grains White bread. White pasta. White rice. Refined cornbread. Bagels and croissants. Crackers that contain trans fat. Vegetables Creamed or fried vegetables. Vegetables in a cheese sauce. Regular canned vegetables. Regular canned tomato sauce and paste. Regular tomato and vegetable juices. Fruits Dried fruits. Canned fruit in light or heavy syrup. Fruit juice. Meat and Other Protein Products Fatty cuts of meat. Ribs, chicken wings, bacon, sausage, bologna, salami, chitterlings, fatback, hot dogs, bratwurst, and packaged luncheon meats. Salted nuts and seeds. Canned beans with salt. Dairy Whole or 2% milk, cream, half-and-half, and cream cheese. Whole-fat or sweetened yogurt. Full-fat   cheeses or blue cheese. Nondairy creamers and whipped toppings. Processed cheese, cheese spreads, or cheese  curds. Condiments Onion and garlic salt, seasoned salt, table salt, and sea salt. Canned and packaged gravies. Worcestershire sauce. Tartar sauce. Barbecue sauce. Teriyaki sauce. Soy sauce, including reduced sodium. Steak sauce. Fish sauce. Oyster sauce. Cocktail sauce. Horseradish. Ketchup and mustard. Meat flavorings and tenderizers. Bouillon cubes. Hot sauce. Tabasco sauce. Marinades. Taco seasonings. Relishes. Fats and Oils Butter, stick margarine, lard, shortening, ghee, and bacon fat. Coconut, palm kernel, or palm oils. Regular salad dressings. Other Pickles and olives. Salted popcorn and pretzels. The items listed above may not be a complete list of foods and beverages to avoid. Contact your dietitian for more information. WHERE CAN I FIND MORE INFORMATION? National Heart, Lung, and Blood Institute: www.nhlbi.nih.gov/health/health-topics/topics/dash/ Document Released: 11/24/2011 Document Revised: 04/21/2014 Document Reviewed: 10/09/2013 ExitCare Patient Information 2015 Exit[employer]. This information is not intended to replace advice given to you by your health care provider. Make sure you discuss any questions you have with your health care provider. Diabetes Mellitus and Food It is important for you to manage your blood sugar (glucose) level. Your blood glucose level can be greatly affected by what you eat. Eating healthier foods in the appropriate amounts throughout the day at about the same time each day will help you control your blood glucose level. It can also help slow or prevent worsening of your diabetes mellitus. Healthy eating may even help you improve the level of your blood pressure and reach or maintain a healthy weight.  HOW CAN FOOD AFFECT ME? Carbohydrates Carbohydrates affect your blood glucose level more than any other type of food. Your dietitian will help you determine how many carbohydrates to eat at each meal and teach you how to count carbohydrates. Counting  carbohydrates is important to keep your blood glucose at a healthy level, especially if you are using insulin or taking certain medicines for diabetes mellitus. Alcohol Alcohol can cause sudden decreases in blood glucose (hypoglycemia), especially if you use insulin or take certain medicines for diabetes mellitus. Hypoglycemia can be a life-threatening condition. Symptoms of hypoglycemia (sleepiness, dizziness, and disorientation) are similar to symptoms of having too much alcohol.  If your health care provider has given you approval to drink alcohol, do so in moderation and use the following guidelines:  Women should not have more than one drink per day, and men should not have more than two drinks per day. One drink is equal to:  12 oz of beer.  5 oz of wine.  1 oz of hard liquor.  Do not drink on an empty stomach.  Keep yourself hydrated. Have water, diet soda, or unsweetened iced tea.  Regular soda, juice, and other mixers might contain a lot of carbohydrates and should be counted. WHAT FOODS ARE NOT RECOMMENDED? As you make food choices, it is important to remember that all foods are not the same. Some foods have fewer nutrients per serving than other foods, even though they might have the same number of calories or carbohydrates. It is difficult to get your body what it needs when you eat foods with fewer nutrients. Examples of foods that you should avoid that are high in calories and carbohydrates but low in nutrients include:  Trans fats (most processed foods list trans fats on the Nutrition Facts label).  Regular soda.  Juice.  Candy.  Sweets, such as cake, pie, doughnuts, and cookies.  Fried foods. WHAT FOODS CAN I EAT? Have nutrient-rich foods,   which will nourish your body and keep you healthy. The food you should eat also will depend on several factors, including:  The calories you need.  The medicines you take.  Your weight.  Your blood glucose level.  Your  blood pressure level.  Your cholesterol level. You also should eat a variety of foods, including:  Protein, such as meat, poultry, fish, tofu, nuts, and seeds (lean animal proteins are best).  Fruits.  Vegetables.  Dairy products, such as milk, cheese, and yogurt (low fat is best).  Breads, grains, pasta, cereal, rice, and beans.  Fats such as olive oil, trans fat-free margarine, canola oil, avocado, and olives. DOES EVERYONE WITH DIABETES MELLITUS HAVE THE SAME MEAL PLAN? Because every person with diabetes mellitus is different, there is not one meal plan that works for everyone. It is very important that you meet with a dietitian who will help you create a meal plan that is just right for you. Document Released: 09/01/2005 Document Revised: 12/10/2013 Document Reviewed: 11/01/2013 ExitCare Patient Information 2015 Exit[employer], [employer]. This information is not intended to replace advice given to you by your health care provider. Make sure you discuss any questions you have with your health care provider.  

## 2015-07-23 ENCOUNTER — Telehealth: Payer: Self-pay

## 2015-07-23 MED ORDER — METFORMIN HCL 500 MG PO TABS: 500.0000 mg | ORAL_TABLET | Freq: Two times a day (BID) | ORAL | Status: DC

## 2015-07-23 NOTE — Telephone Encounter (Signed)
-----   Message from Lorayne Marek, MD sent at 07/23/2015  9:36 AM EDT ----- Call and let  the patient know that her hemoglobin A1c is 6.6%, patient has diabetes, advise patient for low carbohydrate diet and start taking metformin 500 mg twice a day, repeat hemoglobin A1c in 3 months.

## 2015-07-23 NOTE — Progress Notes (Signed)
Loop recorder 

## 2015-07-23 NOTE — Telephone Encounter (Signed)
Patient is aware of her lab results Prescription for metformin went to [facility] on file

## 2015-07-30 ENCOUNTER — Ambulatory Visit: Payer: Medicare Other | Attending: Family Medicine | Admitting: Pharmacist

## 2015-07-30 DIAGNOSIS — I639 Cerebral infarction, unspecified: Secondary | ICD-10-CM

## 2015-07-30 LAB — CUP PACEART REMOTE DEVICE CHECK: Date Time Interrogation Session: 20160811111500

## 2015-07-30 LAB — POCT INR: INR: 2.2

## 2015-07-30 NOTE — Progress Notes (Signed)
Mary [name] is a 38 YOF on warfarin anticoagulation for APL syndrome/CVA with a INR goal of 2.5 to 3.5 per neurology. Pt reports blood in stool s/p hemorrhoids which has been a chronic problem.  Pt states bleeding only occurs with bowel movements and stops.  Instructed pt to continue to monitor bleeding and contact clinic if pt has s/sx of increase bleeding volume or frequency.  Instructed pt to continue stool softener and increase fluids to help with hemorrhoids.   INR subtherapeutic today at 2.2.  Will give 10 mg Warfarin today and increase warfarin to 5 mg daily except 2.5 mg Wed and Sat.

## 2015-07-31 ENCOUNTER — Telehealth: Payer: Self-pay

## 2015-07-31 NOTE — Telephone Encounter (Signed)
Patient had a missed call from [facility] CVD office.  Not sure who called no vmsg  And no note.

## 2015-08-05 ENCOUNTER — Ambulatory Visit (INDEPENDENT_AMBULATORY_CARE_PROVIDER_SITE_OTHER): Payer: Medicare Other

## 2015-08-05 DIAGNOSIS — Z5181 Encounter for therapeutic drug level monitoring: Secondary | ICD-10-CM

## 2015-08-05 DIAGNOSIS — I639 Cerebral infarction, unspecified: Secondary | ICD-10-CM | POA: Diagnosis not present

## 2015-08-05 LAB — POCT INR: INR: 3

## 2015-08-05 MED ORDER — WARFARIN SODIUM 5 MG PO TABS: ORAL_TABLET | ORAL | Status: DC

## 2015-08-07 ENCOUNTER — Encounter: Payer: Self-pay | Admitting: Internal Medicine

## 2015-08-19 ENCOUNTER — Ambulatory Visit (INDEPENDENT_AMBULATORY_CARE_PROVIDER_SITE_OTHER): Payer: Medicare Other

## 2015-08-19 DIAGNOSIS — I639 Cerebral infarction, unspecified: Secondary | ICD-10-CM | POA: Diagnosis not present

## 2015-08-19 DIAGNOSIS — Z5181 Encounter for therapeutic drug level monitoring: Secondary | ICD-10-CM

## 2015-08-19 LAB — POCT INR: INR: 2.8

## 2015-09-09 ENCOUNTER — Other Ambulatory Visit: Payer: Self-pay | Admitting: Internal Medicine

## 2015-09-09 ENCOUNTER — Telehealth: Payer: Self-pay | Admitting: *Deleted

## 2015-09-09 NOTE — Telephone Encounter (Signed)
  1. Which medications need to be refilled? Metoprolol 25mg    2. Which pharmacy is medication to be sent to? [facility]  3. Do they need a 30 day or 90 day supply? 30 day  4. Would they like a call back once the medication has been sent to the pharmacy? Yes

## 2015-09-10 ENCOUNTER — Other Ambulatory Visit: Payer: Self-pay

## 2015-09-10 ENCOUNTER — Other Ambulatory Visit: Payer: Self-pay | Admitting: Internal Medicine

## 2015-09-10 DIAGNOSIS — I10 Essential (primary) hypertension: Secondary | ICD-10-CM

## 2015-09-10 MED ORDER — METOPROLOL TARTRATE 25 MG PO TABS: 25.0000 mg | ORAL_TABLET | Freq: Two times a day (BID) | ORAL | Status: DC

## 2015-09-10 NOTE — Telephone Encounter (Signed)
Please advise on refill as patient has not had an office visit in a while. Thanks, MI

## 2015-09-10 NOTE — Telephone Encounter (Signed)
Patient requesting Rx 

## 2015-09-10 NOTE — Telephone Encounter (Signed)
Per Neuro: She is doing well, PCP changed her from Norvasc to Metoprolol because she was having frequent headaches, which has helped.  Please send to pcp

## 2015-09-11 NOTE — Telephone Encounter (Signed)
Primary care/ neurology to refill meds

## 2015-09-16 ENCOUNTER — Ambulatory Visit (INDEPENDENT_AMBULATORY_CARE_PROVIDER_SITE_OTHER): Payer: Medicare Other

## 2015-09-16 DIAGNOSIS — Z5181 Encounter for therapeutic drug level monitoring: Secondary | ICD-10-CM | POA: Diagnosis not present

## 2015-09-16 DIAGNOSIS — I639 Cerebral infarction, unspecified: Secondary | ICD-10-CM | POA: Diagnosis not present

## 2015-09-16 LAB — POCT INR: INR: 2.2

## 2015-09-18 ENCOUNTER — Ambulatory Visit (INDEPENDENT_AMBULATORY_CARE_PROVIDER_SITE_OTHER): Payer: Medicare Other | Admitting: *Deleted

## 2015-09-18 DIAGNOSIS — I639 Cerebral infarction, unspecified: Secondary | ICD-10-CM

## 2015-09-21 DIAGNOSIS — Z23 Encounter for immunization: Secondary | ICD-10-CM | POA: Diagnosis not present

## 2015-09-22 NOTE — Progress Notes (Signed)
Loop recorder 

## 2015-09-30 ENCOUNTER — Encounter: Payer: Self-pay | Admitting: Emergency Medicine

## 2015-09-30 ENCOUNTER — Emergency Department
Admission: EM | Admit: 2015-09-30 | Discharge: 2015-09-30 | Disposition: A | Discharge status: Home / Self Care | Payer: Medicare Other | Attending: Emergency Medicine | Admitting: Emergency Medicine

## 2015-09-30 DIAGNOSIS — Z79899 Other long term (current) drug therapy: Secondary | ICD-10-CM | POA: Diagnosis not present

## 2015-09-30 DIAGNOSIS — K529 Noninfective gastroenteritis and colitis, unspecified: Secondary | ICD-10-CM | POA: Insufficient documentation

## 2015-09-30 DIAGNOSIS — Z7901 Long term (current) use of anticoagulants: Secondary | ICD-10-CM | POA: Insufficient documentation

## 2015-09-30 DIAGNOSIS — I10 Essential (primary) hypertension: Secondary | ICD-10-CM | POA: Insufficient documentation

## 2015-09-30 DIAGNOSIS — R112 Nausea with vomiting, unspecified: Secondary | ICD-10-CM | POA: Diagnosis present

## 2015-09-30 LAB — GLUCOSE, CAPILLARY: GLUCOSE-CAPILLARY: 105 mg/dL — AB (ref 65–99)

## 2015-09-30 MED ORDER — ONDANSETRON HCL 4 MG PO TABS: 4.0000 mg | ORAL_TABLET | Freq: Every day | ORAL | Status: DC | PRN

## 2015-09-30 MED ORDER — ACETAMINOPHEN 325 MG PO TABS
650.0000 mg | ORAL_TABLET | Freq: Once | ORAL | Status: AC
  Administered 2015-09-30: 650 mg via ORAL
  Filled 2015-09-30: qty 2

## 2015-09-30 MED ORDER — ONDANSETRON 4 MG PO TBDP
4.0000 mg | ORAL_TABLET | Freq: Once | ORAL | Status: AC
  Administered 2015-09-30: 4 mg via ORAL
  Filled 2015-09-30: qty 1

## 2015-09-30 NOTE — ED Notes (Signed)
Says flu like sx since yesterday.  Pt vomiting now.

## 2015-09-30 NOTE — ED Provider Notes (Addendum)
[facility] Emergency Department Provider Note  ____________________________________________   I have reviewed the triage vital signs and the nursing notes.   HISTORY  Chief Complaint Nausea and Influenza    HPI Mary [name] is a 49 y.o. female presents today complaining of nausea, vomiting and loose stools. She states that she had 1 episode of vomiting, and 2 doses of loose stool. She denies any melena or bright red blood per rectum. She denies any cough shortness of breath or chest pain. She is compliant with her medications. She has had no hematemesis. She states that she has body aches. She denies any focal abdominal pain. She states "I do not want a needle". She is adamant that we try to make her feel better without IV intervention..  Past Medical History  Diagnosis Date  . Hypertension   . Other and unspecified ovarian cysts   . Stroke [facility])     Patient Active Problem List   Diagnosis Date Noted  . Cerebral infarction due to embolism of left middle cerebral artery ([place]) 07/16/2015  . Antiphospholipid syndrome ([place]) 11/17/2014  . Cryptogenic stroke ([place]) 10/08/2014  . Essential hypertension 10/08/2014  . Obesity 10/08/2014  . Unspecified vitamin D deficiency 09/08/2014  . Anemia, unspecified 09/08/2014  . Severe obesity (BMI >= 40) ([place]) 08/21/2013  . Acquired dyslexia 07/12/2013  . Aphasia due to stroke 07/12/2013  . Headache 07/12/2013  . Depression due to stroke ([place]) 07/12/2013  . CVA (cerebral infarction) 01/24/2013  . HTN (hypertension) 01/24/2013    Past Surgical History  Procedure Laterality Date  . Ovarian cyst removal    . Tee without cardioversion N/A 01/25/2013    Procedure: TRANSESOPHAGEAL ECHOCARDIOGRAM (TEE);  Surgeon: Birdie Riddle, MD;  Location: [place];  Service: Cardiovascular;  Laterality: N/A;  . Loop recorder implant N/A 04/25/2014    Procedure: LOOP RECORDER IMPLANT;  Surgeon: Coralyn Mark, MD;  Location: [facility]  CATH LAB;  Service: Cardiovascular;  Laterality: N/A;    Current Outpatient Rx  Name  Route  Sig  Dispense  Refill  . amLODipine (NORVASC) 5 MG tablet   Oral   Take 5 mg by mouth daily.         . metFORMIN (GLUCOPHAGE) 500 MG tablet   Oral   Take 1 tablet (500 mg total) by mouth 2 (two) times daily with a meal.   180 tablet   3   . metoprolol tartrate (LOPRESSOR) 25 MG tablet   Oral   Take 1 tablet (25 mg total) by mouth 2 (two) times daily.   180 tablet   3   . tacrolimus (PROTOPIC) 0.1 % ointment   Topical   Apply topically 2 (two) times daily.          Marland Kitchen triamcinolone cream (KENALOG) 0.1 %   Topical   Apply 1 application topically as needed.          . warfarin (COUMADIN) 5 MG tablet      Take as directed by Coumadin Clinic   90 tablet   0     90 day supply     Allergies Review of patient's allergies indicates no known allergies.  Family History  Problem Relation Age of Onset  . Diabetes Mellitus II Mother   . Diabetes Mother   . Stroke Other   . Diabetes Other   . Transient ischemic attack Father   . Diabetes Sister     Social History Social History  Substance Use Topics  .  Smoking status: Never Smoker   . Smokeless tobacco: Never Used  . Alcohol Use: No    Review of Systems Constitutional: + fever/chills Eyes: No visual changes. ENT: No sore throat. No stiff neck no neck pain Cardiovascular: Denies chest pain. Respiratory: Denies shortness of breath. Gastrointestinal:   See history of present illness Genitourinary: Negative for dysuria. Musculoskeletal: Negative lower extremity swelling Skin: Negative for rash. Neurological: Negative for headaches, focal weakness or numbness. 10-point ROS otherwise negative.  ____________________________________________   PHYSICAL EXAM:  VITAL SIGNS: ED Triage Vitals  Enc Vitals Group     BP 09/30/15 1146 123/104 mmHg     Pulse Rate 09/30/15 1146 111     Resp 09/30/15 1146 16     Temp  09/30/15 1146 100.1 F (37.8 C)     Temp Source 09/30/15 1146 Oral     SpO2 09/30/15 1146 99 %     Weight 09/30/15 1146 300 lb (136.079 kg)     Height 09/30/15 1146 5\' 5"  (1.651 m)     Head Cir --      Peak Flow --      Pain Score --      Pain Loc --      Pain Edu? --      Excl. in [place]? --     Constitutional: Alert and oriented. Well appearing and in no acute distress. Eyes: Conjunctivae are normal. PERRL. EOMI. Head: Atraumatic. Nose: No congestion/rhinnorhea. Mouth/Throat: Mucous membranes are moist.  Oropharynx non-erythematous. Neck: No stridor.   Nontender with no meningismus Cardiovascular: Normal rate, regular rhythm. Grossly normal heart sounds.  Good peripheral circulation. Respiratory: Normal respiratory effort.  No retractions. Lungs CTAB. Gastrointestinal: Soft and nontender. No distention. No guarding no rebound normal bowel sounds Back:  There is no focal tenderness or step off there is no midline tenderness there are no lesions noted. there is no CVA tenderness Musculoskeletal: No lower extremity tenderness. No joint effusions, no DVT signs strong distal pulses no edema Neurologic:  Normal speech and language. No gross focal neurologic deficits are appreciated.  Skin:  Skin is warm, dry and intact. No rash noted. Psychiatric: Mood and affect are normal. Speech and behavior are normal.  ____________________________________________   LABS (all labs ordered are listed, but only abnormal results are displayed)  Labs Reviewed - No data to display ____________________________________________  EKG   ____________________________________________  RADIOLOGY   ____________________________________________   PROCEDURES  Procedure(s) performed: None  Critical Care performed: None  ____________________________________________   INITIAL IMPRESSION / ASSESSMENT AND PLAN / ED COURSE  Pertinent labs & imaging results that were available during my care of the  patient were reviewed by me and considered in my medical decision making (see chart for details).  Very well-appearing woman with nausea vomiting or loose stools times a few hours. She states she felt like she was "coming down with something" yesterday. She actually appears quite well. Heart rate when I'm in the room is in the 90s to low 100s. I've offered her IV hydration and offered to check her blood work but she declines at this point. She states "I do not want a needle". She understands limitation this puts on the workup. We will however try oral antibiotics, oral antipyretics and we will reassess closely. ____________________________________________   ----------------------------------------- 1:24 PM on 09/30/2015 -----------------------------------------  Patient states she feels much better not nauseated tolerating by mouth serial abdominal exams are benign. She understands that without blood work and IV cannot tell her her INR,  I cannot tell her kidney function etc. However, she has no evidence of acute intra-abdominal pathology today and she is feeling much better. Patient is demented discharge she states she wants to go home she does not wish to be here anymore. As she declines IV and admission and further evaluation here, we'll discharge her home with extensive return precautions and follow-up instructions given and understood.  FINAL CLINICAL IMPRESSION(S) / ED DIAGNOSES  Final diagnoses:  None     Schuyler Amor, MD 09/30/15 1234  Schuyler Amor, MD 09/30/15 1325

## 2015-09-30 NOTE — ED Notes (Signed)
Pt attempted to get urine specimen, but missed the specimen cup. Pt given water to drink and will attempt again. MD made aware.

## 2015-09-30 NOTE — Discharge Instructions (Signed)
At this time, you feel much better which is reassuring. However, without IV fluid and blood work and further testing we cannot rule out all the possible causes of why you felt bad today. You have declined this, which is certainly your choice but it does limit what we can help you with today. If you feel worse in any way therefore including abdominal pain shortness of breath vomiting any bleeding or black stool or you feel worse in any way please return to the emergency room. Follow closely with your doctor tomorrow.

## 2015-10-02 DIAGNOSIS — J0510 Acute epiglottitis without obstruction: Secondary | ICD-10-CM | POA: Diagnosis not present

## 2015-10-02 DIAGNOSIS — R1310 Dysphagia, unspecified: Secondary | ICD-10-CM | POA: Diagnosis not present

## 2015-10-02 DIAGNOSIS — J020 Streptococcal pharyngitis: Secondary | ICD-10-CM | POA: Diagnosis not present

## 2015-10-02 DIAGNOSIS — J029 Acute pharyngitis, unspecified: Secondary | ICD-10-CM | POA: Diagnosis not present

## 2015-10-05 ENCOUNTER — Encounter: Payer: Self-pay | Admitting: Internal Medicine

## 2015-10-12 ENCOUNTER — Telehealth: Payer: Self-pay | Admitting: *Deleted

## 2015-10-12 NOTE — Telephone Encounter (Signed)
Called patient back to let her know that we still have not received her manual transmission.  I attempted to give her the tech services phone number to call to troubleshoot her monitor, but patient states she is about to go to work, and requested that I call her back tomorrow afternoon.  Will call tomorrow.

## 2015-10-12 NOTE — Telephone Encounter (Signed)
Called patient to request that she send a manual transmission from her [employer] monitor for further review of pause episodes.  Patient sent transmission while on the phone.  Will review when received in [employer].  Discussed September pause episode that is available for review from 08/30/15.  Patient denies symptoms, states she was sleeping at the time of the episode.  Advised patient that I will call her back in 20-30 minutes if manual transmission is not received.  Patient voices understanding and appreciation of call.

## 2015-10-13 NOTE — Telephone Encounter (Signed)
Attempted to reach patient, not home at present time.  Gave [employer] tech services phone number to her mother so that patient can call to troubleshoot her monitor when she is available.

## 2015-10-14 ENCOUNTER — Ambulatory Visit (INDEPENDENT_AMBULATORY_CARE_PROVIDER_SITE_OTHER): Payer: Medicare Other

## 2015-10-14 DIAGNOSIS — I639 Cerebral infarction, unspecified: Secondary | ICD-10-CM

## 2015-10-14 DIAGNOSIS — Z5181 Encounter for therapeutic drug level monitoring: Secondary | ICD-10-CM | POA: Diagnosis not present

## 2015-10-14 LAB — POCT INR: INR: 3.5

## 2015-10-14 MED ORDER — WARFARIN SODIUM 5 MG PO TABS: ORAL_TABLET | ORAL | Status: DC

## 2015-10-14 NOTE — Telephone Encounter (Signed)
Error

## 2015-10-19 ENCOUNTER — Ambulatory Visit (INDEPENDENT_AMBULATORY_CARE_PROVIDER_SITE_OTHER): Payer: Medicare Other | Admitting: *Deleted

## 2015-10-19 DIAGNOSIS — I639 Cerebral infarction, unspecified: Secondary | ICD-10-CM | POA: Diagnosis not present

## 2015-10-19 LAB — CUP PACEART REMOTE DEVICE CHECK: MDC IDC SESS DTM: 20161003040500

## 2015-10-20 NOTE — Progress Notes (Signed)
Loop recorder 

## 2015-10-30 ENCOUNTER — Encounter: Payer: Self-pay | Admitting: Internal Medicine

## 2015-11-08 ENCOUNTER — Emergency Department: Payer: Medicare Other

## 2015-11-08 ENCOUNTER — Inpatient Hospital Stay
Admission: EM | Admit: 2015-11-08 | Discharge: 2015-11-11 | DRG: 872 | Disposition: A | Discharge status: Home / Self Care | Payer: Medicare Other | Attending: Internal Medicine | Admitting: Internal Medicine

## 2015-11-08 DIAGNOSIS — K921 Melena: Secondary | ICD-10-CM | POA: Diagnosis not present

## 2015-11-08 DIAGNOSIS — R195 Other fecal abnormalities: Secondary | ICD-10-CM

## 2015-11-08 DIAGNOSIS — Z6841 Body Mass Index (BMI) 40.0 and over, adult: Secondary | ICD-10-CM | POA: Diagnosis not present

## 2015-11-08 DIAGNOSIS — J029 Acute pharyngitis, unspecified: Secondary | ICD-10-CM

## 2015-11-08 DIAGNOSIS — A419 Sepsis, unspecified organism: Secondary | ICD-10-CM

## 2015-11-08 DIAGNOSIS — D649 Anemia, unspecified: Secondary | ICD-10-CM | POA: Insufficient documentation

## 2015-11-08 DIAGNOSIS — I10 Essential (primary) hypertension: Secondary | ICD-10-CM | POA: Diagnosis present

## 2015-11-08 DIAGNOSIS — Z8673 Personal history of transient ischemic attack (TIA), and cerebral infarction without residual deficits: Secondary | ICD-10-CM

## 2015-11-08 DIAGNOSIS — D62 Acute posthemorrhagic anemia: Secondary | ICD-10-CM | POA: Diagnosis not present

## 2015-11-08 DIAGNOSIS — E119 Type 2 diabetes mellitus without complications: Secondary | ICD-10-CM | POA: Diagnosis present

## 2015-11-08 DIAGNOSIS — J020 Streptococcal pharyngitis: Secondary | ICD-10-CM | POA: Diagnosis present

## 2015-11-08 DIAGNOSIS — Z7984 Long term (current) use of oral hypoglycemic drugs: Secondary | ICD-10-CM

## 2015-11-08 DIAGNOSIS — A409 Streptococcal sepsis, unspecified: Principal | ICD-10-CM | POA: Diagnosis present

## 2015-11-08 DIAGNOSIS — J9811 Atelectasis: Secondary | ICD-10-CM | POA: Diagnosis not present

## 2015-11-08 DIAGNOSIS — D500 Iron deficiency anemia secondary to blood loss (chronic): Secondary | ICD-10-CM | POA: Diagnosis present

## 2015-11-08 DIAGNOSIS — F329 Major depressive disorder, single episode, unspecified: Secondary | ICD-10-CM | POA: Diagnosis present

## 2015-11-08 DIAGNOSIS — D6861 Antiphospholipid syndrome: Secondary | ICD-10-CM | POA: Diagnosis not present

## 2015-11-08 DIAGNOSIS — K641 Second degree hemorrhoids: Secondary | ICD-10-CM | POA: Diagnosis present

## 2015-11-08 DIAGNOSIS — D122 Benign neoplasm of ascending colon: Secondary | ICD-10-CM | POA: Insufficient documentation

## 2015-11-08 DIAGNOSIS — Z7901 Long term (current) use of anticoagulants: Secondary | ICD-10-CM

## 2015-11-08 DIAGNOSIS — E559 Vitamin D deficiency, unspecified: Secondary | ICD-10-CM | POA: Diagnosis present

## 2015-11-08 DIAGNOSIS — Z79899 Other long term (current) drug therapy: Secondary | ICD-10-CM

## 2015-11-08 HISTORY — DX: Type 2 diabetes mellitus without complications: E11.9

## 2015-11-08 LAB — PROTIME-INR
INR: 2.38
PROTHROMBIN TIME: 25.7 s — AB (ref 11.4–15.0)

## 2015-11-08 LAB — CBC WITH DIFFERENTIAL/PLATELET
BASOS ABS: 0.1 10*3/uL (ref 0–0.1)
BASOS PCT: 1 %
Eosinophils Absolute: 0 10*3/uL (ref 0–0.7)
Eosinophils Relative: 0 %
HEMATOCRIT: 25.8 % — AB (ref 35.0–47.0)
Hemoglobin: 8 g/dL — ABNORMAL LOW (ref 12.0–16.0)
Lymphocytes Relative: 14 %
Lymphs Abs: 1.9 10*3/uL (ref 1.0–3.6)
MCH: 17.5 pg — ABNORMAL LOW (ref 26.0–34.0)
MCHC: 31 g/dL — ABNORMAL LOW (ref 32.0–36.0)
MCV: 56.5 fL — ABNORMAL LOW (ref 80.0–100.0)
MONO ABS: 0.5 10*3/uL (ref 0.2–0.9)
Monocytes Relative: 3 %
NEUTROS ABS: 10.9 10*3/uL — AB (ref 1.4–6.5)
Neutrophils Relative %: 82 %
PLATELETS: 384 10*3/uL (ref 150–440)
RBC: 4.57 MIL/uL (ref 3.80–5.20)
RDW: 20.1 % — AB (ref 11.5–14.5)
WBC: 13.3 10*3/uL — AB (ref 3.6–11.0)

## 2015-11-08 LAB — COMPREHENSIVE METABOLIC PANEL
ALK PHOS: 57 U/L (ref 38–126)
ALT: 16 U/L (ref 14–54)
ANION GAP: 7 (ref 5–15)
AST: 25 U/L (ref 15–41)
Albumin: 3.5 g/dL (ref 3.5–5.0)
BILIRUBIN TOTAL: 0.5 mg/dL (ref 0.3–1.2)
BUN: 13 mg/dL (ref 6–20)
CALCIUM: 8.7 mg/dL — AB (ref 8.9–10.3)
CO2: 24 mmol/L (ref 22–32)
Chloride: 108 mmol/L (ref 101–111)
Creatinine, Ser: 1.07 mg/dL — ABNORMAL HIGH (ref 0.44–1.00)
GFR, EST NON AFRICAN AMERICAN: 59 mL/min — AB (ref >60)
GLUCOSE: 128 mg/dL — AB (ref 65–99)
Potassium: 3.5 mmol/L (ref 3.5–5.1)
Sodium: 139 mmol/L (ref 135–145)
TOTAL PROTEIN: 8.7 g/dL — AB (ref 6.5–8.1)

## 2015-11-08 LAB — LACTIC ACID, PLASMA: Lactic Acid, Venous: 1.7 mmol/L (ref 0.5–2.0)

## 2015-11-08 LAB — RETICULOCYTES
RBC.: 4.62 MIL/uL (ref 3.80–5.20)
RETIC CT PCT: 1.3 % (ref 0.4–3.1)
Retic Count, Absolute: 60.1 10*3/uL (ref 19.0–183.0)

## 2015-11-08 LAB — TROPONIN I

## 2015-11-08 IMAGING — CR DG CHEST 2V
2 series · 2 of 2 positions shown · non-contrast
Comparison: Chest radiograph performed [DATE]

CLINICAL DATA: Acute onset of sore throat for 2 days, with chills.
Initial encounter.

EXAM:
CHEST  2 VIEW

[chest pa]
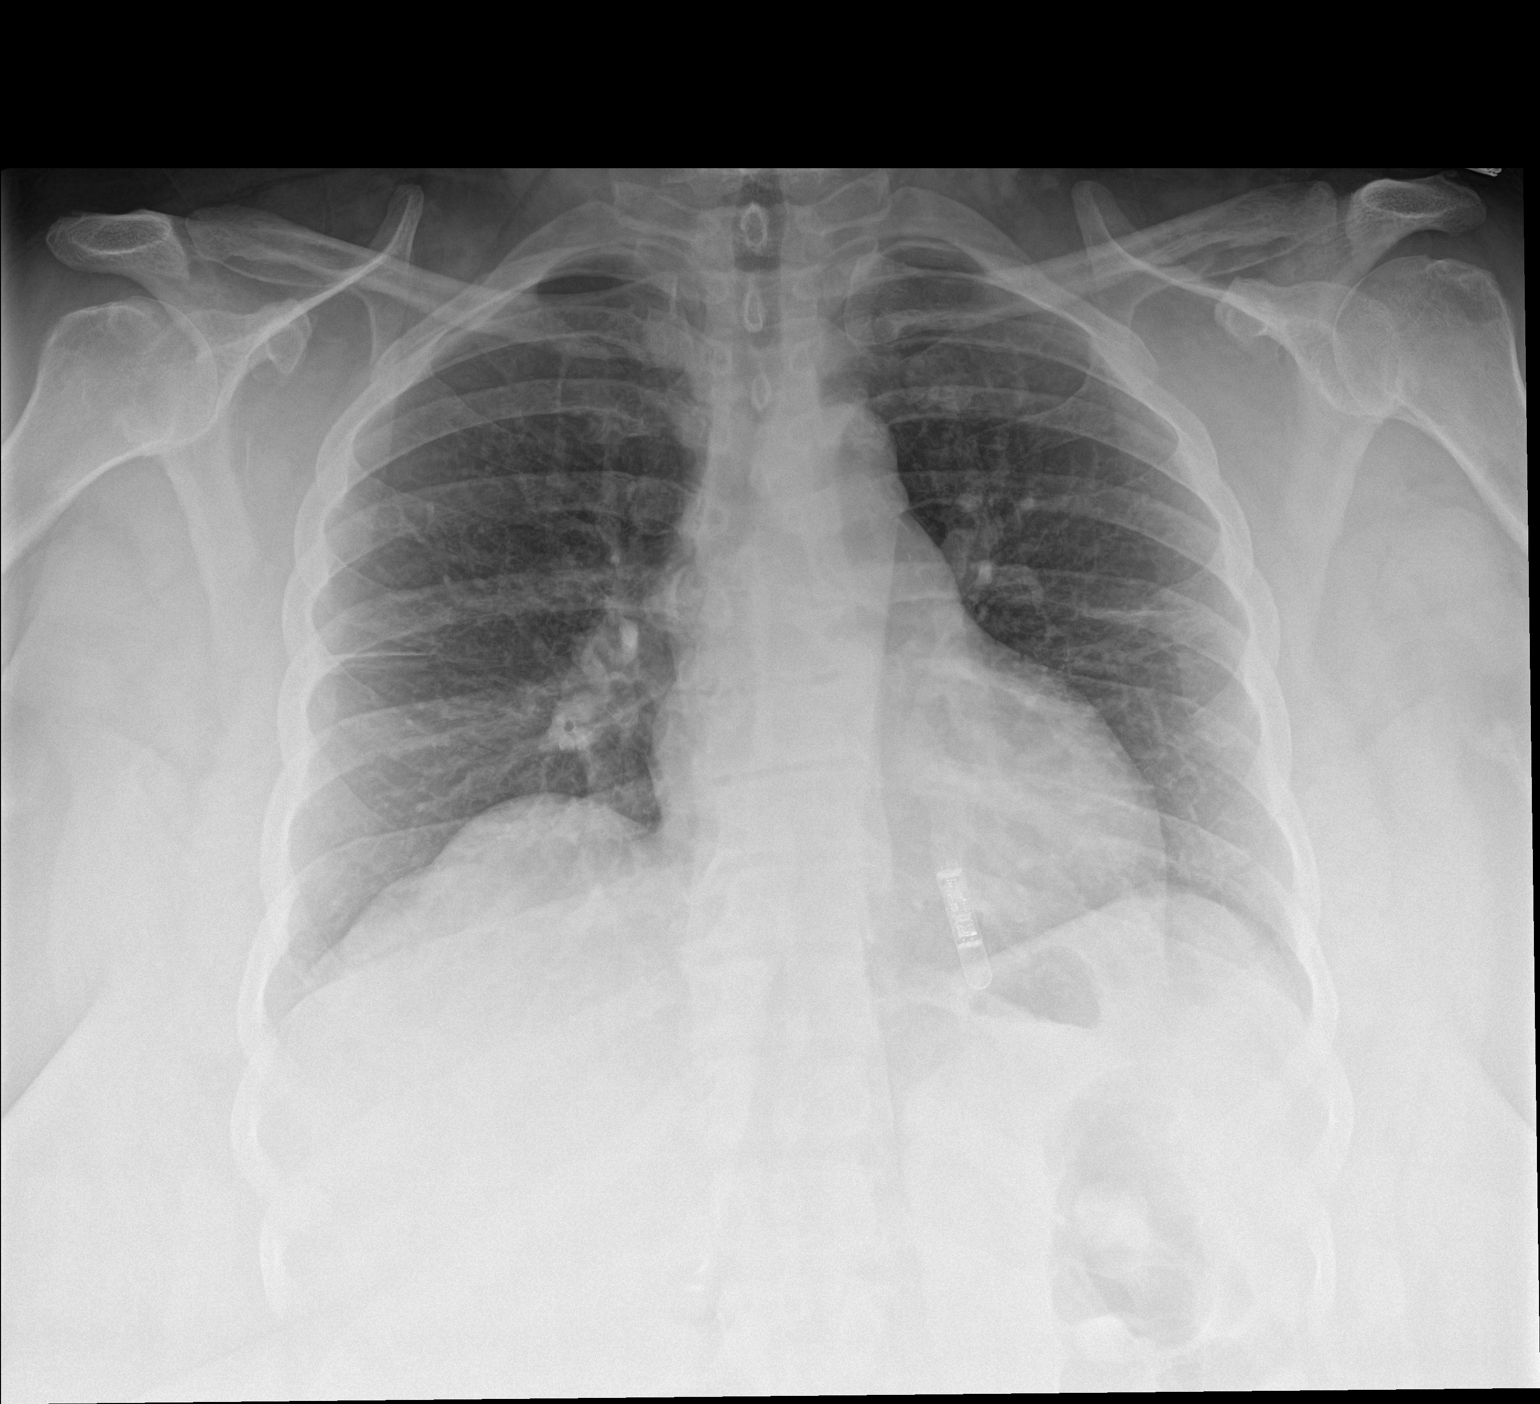

[chest lat]
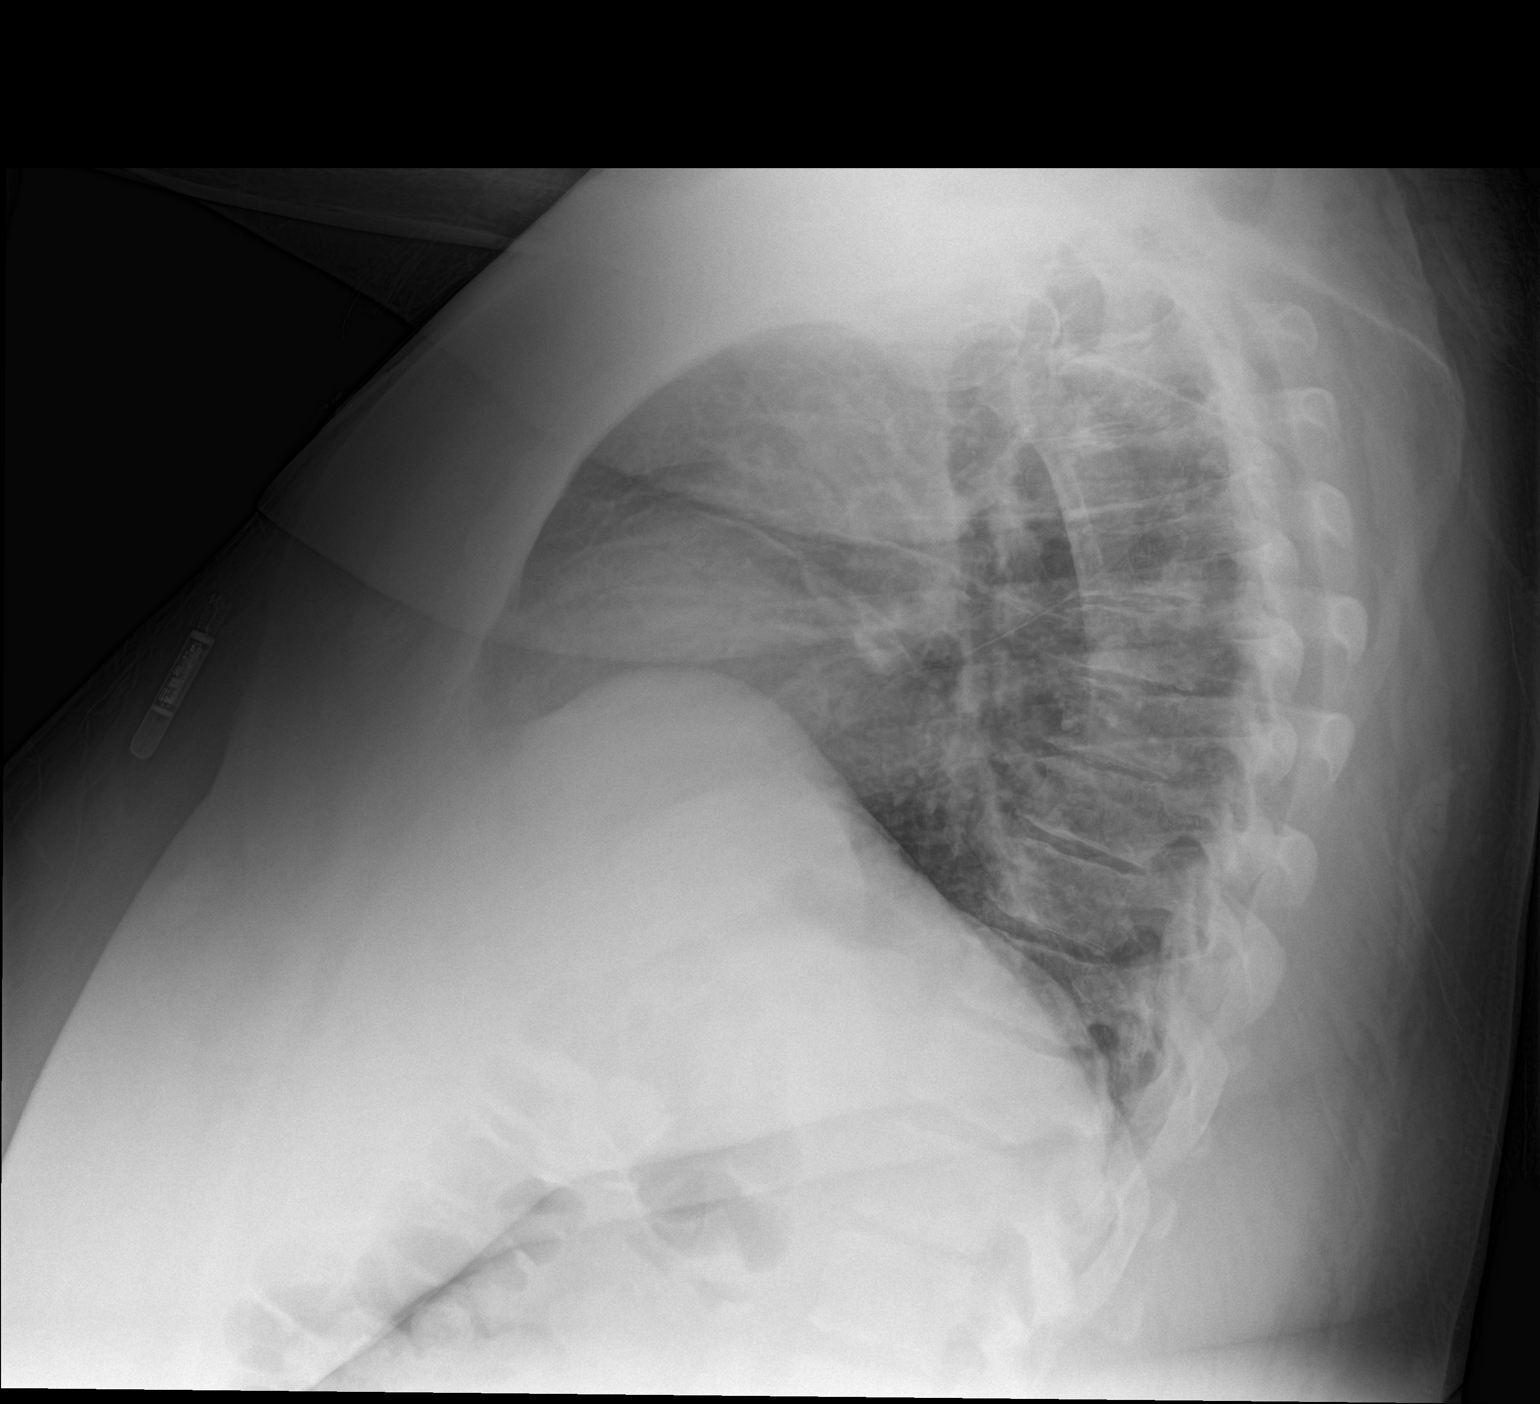

[2 of 2 positions shown; findings below may reference images not displayed]

FINDINGS: The lungs are well-aerated. Minimal bilateral atelectasis is noted.
There is no evidence of pleural effusion or pneumothorax.

The heart is normal in size; a loop recorder is noted. No acute
osseous abnormalities are seen. Clips are noted within the right
upper quadrant, reflecting prior cholecystectomy.
IMPRESSION: Minimal bilateral atelectasis noted.  Lungs otherwise clear.

## 2015-11-08 MED ORDER — VANCOMYCIN HCL IN DEXTROSE 1-5 GM/200ML-% IV SOLN: 1000.0000 mg | Freq: Once | INTRAVENOUS | Status: DC

## 2015-11-08 MED ORDER — PIPERACILLIN-TAZOBACTAM 3.375 G IVPB 30 MIN
3.3750 g | Freq: Once | INTRAVENOUS | Status: AC
  Administered 2015-11-09: 3.375 g via INTRAVENOUS
  Filled 2015-11-08: qty 50

## 2015-11-08 MED ORDER — SODIUM CHLORIDE 0.9 % IV BOLUS (SEPSIS)
1000.0000 mL | INTRAVENOUS | Status: AC
  Administered 2015-11-08: 1000 mL via INTRAVENOUS

## 2015-11-08 MED ORDER — SODIUM CHLORIDE 0.9 % IV BOLUS (SEPSIS)
500.0000 mL | INTRAVENOUS | Status: AC
  Administered 2015-11-08: 500 mL via INTRAVENOUS

## 2015-11-08 MED ORDER — VANCOMYCIN HCL 10 G IV SOLR
1500.0000 mg | Freq: Once | INTRAVENOUS | Status: DC
  Filled 2015-11-08: qty 1500

## 2015-11-08 MED ORDER — CEPHALEXIN 500 MG PO CAPS
500.0000 mg | ORAL_CAPSULE | Freq: Once | ORAL | Status: AC
  Administered 2015-11-08: 500 mg via ORAL
  Filled 2015-11-08: qty 1

## 2015-11-08 MED ORDER — CEPHALEXIN 500 MG PO CAPS
ORAL_CAPSULE | ORAL | Status: AC
  Administered 2015-11-08: 500 mg via ORAL
  Filled 2015-11-08: qty 1

## 2015-11-08 MED ORDER — ACETAMINOPHEN 500 MG PO TABS
1000.0000 mg | ORAL_TABLET | Freq: Once | ORAL | Status: AC
  Administered 2015-11-08: 1000 mg via ORAL
  Filled 2015-11-08: qty 2

## 2015-11-08 NOTE — ED Provider Notes (Addendum)
[facility] Department Provider Note  ____________________________________________  Time seen: Approximately [id number] PM  I have reviewed the triage vital signs and the nursing notes.   HISTORY  Chief Complaint Sore Throat    HPI Mary [name] is a 49 y.o. female with a history of stroke with a pacemaker on Coumadin who is presenting today with throat pain and chills. She says that she was treated several weeks ago for strep throat and her symptoms resolved. Then she says that her grandchild became ill this past Thursday with strep throat. She was exposed the child and then today began having throat pain. She denies a cough, runny nose, nausea or vomiting. Says that she has been having chills all day. Denies feeling warm or having a documented fever at home.   Past Medical History  Diagnosis Date  . Hypertension   . Other and unspecified ovarian cysts   . Stroke [facility])     Patient Active Problem List   Diagnosis Date Noted  . Cerebral infarction due to embolism of left middle cerebral artery ([place]) 07/16/2015  . Antiphospholipid syndrome ([place]) 11/17/2014  . Cryptogenic stroke ([place]) 10/08/2014  . Essential hypertension 10/08/2014  . Obesity 10/08/2014  . Unspecified vitamin D deficiency 09/08/2014  . Anemia, unspecified 09/08/2014  . Severe obesity (BMI >= 40) ([place]) 08/21/2013  . Acquired dyslexia 07/12/2013  . Aphasia due to stroke 07/12/2013  . Headache 07/12/2013  . Depression due to stroke ([place]) 07/12/2013  . CVA (cerebral infarction) 01/24/2013  . HTN (hypertension) 01/24/2013    Past Surgical History  Procedure Laterality Date  . Ovarian cyst removal    . Tee without cardioversion N/A 01/25/2013    Procedure: TRANSESOPHAGEAL ECHOCARDIOGRAM (TEE);  Surgeon: Birdie Riddle, MD;  Location: [place];  Service: Cardiovascular;  Laterality: N/A;  . Loop recorder implant N/A 04/25/2014    Procedure: LOOP RECORDER IMPLANT;  Surgeon: Coralyn Mark, MD;  Location: [facility] CATH LAB;  Service: Cardiovascular;  Laterality: N/A;    Current Outpatient Rx  Name  Route  Sig  Dispense  Refill  . amLODipine (NORVASC) 5 MG tablet   Oral   Take 5 mg by mouth daily.         . metFORMIN (GLUCOPHAGE) 500 MG tablet   Oral   Take 1 tablet (500 mg total) by mouth 2 (two) times daily with a meal.   180 tablet   3   . metoprolol tartrate (LOPRESSOR) 25 MG tablet   Oral   Take 1 tablet (25 mg total) by mouth 2 (two) times daily.   180 tablet   3   . ondansetron (ZOFRAN) 4 MG tablet   Oral   Take 1 tablet (4 mg total) by mouth daily as needed for nausea or vomiting.   10 tablet   0   . tacrolimus (PROTOPIC) 0.1 % ointment   Topical   Apply topically 2 (two) times daily.          Marland Kitchen triamcinolone cream (KENALOG) 0.1 %   Topical   Apply 1 application topically as needed.          . warfarin (COUMADIN) 5 MG tablet      Take as directed by [facility]   90 tablet   1     90 day supply     Allergies Review of patient's allergies indicates no known allergies.  Family History  Problem Relation Age of Onset  . Diabetes Mellitus II Mother   .  Diabetes Mother   . Stroke Other   . Diabetes Other   . Transient ischemic attack Father   . Diabetes Sister     Social History Social History  Substance Use Topics  . Smoking status: Never Smoker   . Smokeless tobacco: Never Used  . Alcohol Use: No    Review of Systems Constitutional: chills Eyes: No visual changes. ENT: Throat pain  Cardiovascular: Denies chest pain. Respiratory: Denies shortness of breath. Gastrointestinal: No abdominal pain.  No nausea, no vomiting.  No diarrhea.  No constipation. Genitourinary: Negative for dysuria. Musculoskeletal: Negative for back pain. Skin: Negative for rash. Neurological: Negative for headaches, focal weakness or numbness.  10-point ROS otherwise negative.  ____________________________________________   PHYSICAL  EXAM:  VITAL SIGNS: ED Triage Vitals  Enc Vitals Group     BP 11/08/15 1906 149/76 mmHg     Pulse Rate 11/08/15 1906 124     Resp --      Temp 11/08/15 1906 98.5 F (36.9 C)     Temp Source 11/08/15 1906 Oral     SpO2 11/08/15 1906 100 %     Weight 11/08/15 1906 300 lb (136.079 kg)     Height 11/08/15 1906 5\' 5"  (1.651 m)     Head Cir --      Peak Flow --      Pain Score 11/08/15 1912 8     Pain Loc --      Pain Edu? --      Excl. in [place]? --     Constitutional: Alert and oriented. Well appearing and in no acute distress. Eyes: Conjunctivae are normal. PERRL. EOMI. Head: Atraumatic. Nose: No congestion/rhinnorhea. Mouth/Throat: Mucous membranes are moist.  Erythematous pharynx without any tonsillar swelling or exudate. Swollen and tender anterior lymphadenopathy.  Neck: No stridor.   Cardiovascular: Tachycardic, regular rhythm. Grossly normal heart sounds.  Good peripheral circulation. Respiratory: Normal respiratory effort.  No retractions. Lungs CTAB. Gastrointestinal: Soft and nontender. No distention. No abdominal bruits. No CVA tenderness. Musculoskeletal: No lower extremity tenderness nor edema.  No joint effusions. Neurologic:  Normal speech and language. No gross focal neurologic deficits are appreciated. No gait instability. Skin:  Skin is warm, dry and intact. No rash noted. Psychiatric: Mood and affect are normal. Speech and behavior are normal.  ____________________________________________   LABS (all labs ordered are listed, but only abnormal results are displayed)  Labs Reviewed  CULTURE, BLOOD (ROUTINE X 2)  CULTURE, BLOOD (ROUTINE X 2)  URINE CULTURE  COMPREHENSIVE METABOLIC PANEL  CBC WITH DIFFERENTIAL/PLATELET  LACTIC ACID, PLASMA  LACTIC ACID, PLASMA  TROPONIN I  PROTIME-INR  INFLUENZA PANEL BY PCR (TYPE A & B, H1N1)  URINALYSIS COMPLETEWITH MICROSCOPIC ([facility] ONLY)    ____________________________________________  EKG   ____________________________________________  RADIOLOGY   ____________________________________________   PROCEDURES  CRITICAL CARE Performed by: Doran Stabler   Total critical care time: 35 minutes  Critical care time was exclusive of separately billable procedures and treating other patients.  Critical care was necessary to treat or prevent imminent or life-threatening deterioration.  Critical care was time spent personally by me on the following activities: development of treatment plan with patient and/or surrogate as well as nursing, discussions with consultants, evaluation of patient's response to treatment, examination of patient, obtaining history from patient or surrogate, ordering and performing treatments and interventions, ordering and review of laboratory studies, ordering and review of radiographic studies, pulse oximetry and re-evaluation of patient's condition.   ____________________________________________  INITIAL IMPRESSION / ASSESSMENT AND PLAN / ED COURSE  Pertinent labs & imaging results that were available during my care of the patient were reviewed by me and considered in my medical decision making (see chart for details).  ----------------------------------------- 8:45 PM on 11/08/2015 -----------------------------------------  Patient is tachycardic and temperature was retaken and is now 100F. Will give Tylenol and reassess the patient's heart rate as well as temperature.  ----------------------------------------- 11:12 PM on 11/08/2015 -----------------------------------------  Patient continued to be tachycardic and febrile despite antipyretics. Her temperature grasp and gradually increasing. He already has had a course of antibiotics previously for strep throat. Ordered the sepsis protocol for this patient and the patient will likely need admission. Signed out to Dr. Beather Arbour. Dr.  Lavetta Nielsen, the admitting hospitalist, also made aware but without any lab results is not able to admit the patient at this time. I also did the patient that because she has had an increased fever and persistent tachycardia despite antipyretics that we will need to draw labs at this time and she will likely need to be admitted to the hospital. The patient understands the plan and is willing to comply. ____________________________________________   FINAL CLINICAL IMPRESSION(S) / ED DIAGNOSES  Sepsis. Pharyngitis.    Orbie Pyo, MD 11/08/15 2315  ED ECG REPORT I, Doran Stabler, the attending physician, personally viewed and interpreted this ECG.   Date: 11/08/2015  EKG Time: 2256  Rate: 124  Rhythm: sinus tachycardia  Axis: Normal axis  Intervals:none  ST&T Change: No ST segment elevation or depression. No abnormal T-wave inversion.   Orbie Pyo, MD 11/20/15 [id number]

## 2015-11-08 NOTE — ED Notes (Signed)
Sore throat for 2 days, started with chills today, pt was diagnosed with strep throat 2 weeks ago

## 2015-11-08 NOTE — ED Notes (Signed)
Pt moved to room 24. Will be getting worked up for possible sepsis since her temp has been going up and her hr has not gone below 123. Report called.

## 2015-11-08 NOTE — ED Provider Notes (Signed)
-----------------------------------------   11:43 PM on 11/08/2015 -----------------------------------------  Patient remains tachycardic in the 120s. Hemoglobin and hematocrit noted; lower than previous. Patient states she has noted bloody stools but thought they were coming from her hemorrhoids. On rectal exam, patient does have external hemorrhoids which are nonthrombosed and nonbleeding. She had tan stool on gloved finger which is guaiac positive. Will hold Coumadin; obtain type and screen. Discussed with hospitalist who will evaluate patient in the emergency department for admission.  Paulette Blanch, MD 11/08/15 [id number]

## 2015-11-09 ENCOUNTER — Encounter: Payer: Self-pay | Admitting: Internal Medicine

## 2015-11-09 ENCOUNTER — Inpatient Hospital Stay: Payer: Medicare Other

## 2015-11-09 DIAGNOSIS — D649 Anemia, unspecified: Secondary | ICD-10-CM | POA: Diagnosis not present

## 2015-11-09 DIAGNOSIS — J029 Acute pharyngitis, unspecified: Secondary | ICD-10-CM | POA: Diagnosis not present

## 2015-11-09 DIAGNOSIS — I10 Essential (primary) hypertension: Secondary | ICD-10-CM | POA: Diagnosis not present

## 2015-11-09 DIAGNOSIS — Z79899 Other long term (current) drug therapy: Secondary | ICD-10-CM | POA: Diagnosis not present

## 2015-11-09 DIAGNOSIS — Z7984 Long term (current) use of oral hypoglycemic drugs: Secondary | ICD-10-CM | POA: Diagnosis not present

## 2015-11-09 DIAGNOSIS — K635 Polyp of colon: Secondary | ICD-10-CM | POA: Diagnosis not present

## 2015-11-09 DIAGNOSIS — A419 Sepsis, unspecified organism: Secondary | ICD-10-CM | POA: Diagnosis present

## 2015-11-09 DIAGNOSIS — R195 Other fecal abnormalities: Secondary | ICD-10-CM | POA: Diagnosis present

## 2015-11-09 DIAGNOSIS — D500 Iron deficiency anemia secondary to blood loss (chronic): Secondary | ICD-10-CM | POA: Diagnosis present

## 2015-11-09 DIAGNOSIS — K641 Second degree hemorrhoids: Secondary | ICD-10-CM | POA: Diagnosis present

## 2015-11-09 DIAGNOSIS — E559 Vitamin D deficiency, unspecified: Secondary | ICD-10-CM | POA: Diagnosis present

## 2015-11-09 DIAGNOSIS — K648 Other hemorrhoids: Secondary | ICD-10-CM | POA: Diagnosis not present

## 2015-11-09 DIAGNOSIS — Z6841 Body Mass Index (BMI) 40.0 and over, adult: Secondary | ICD-10-CM | POA: Diagnosis not present

## 2015-11-09 DIAGNOSIS — R591 Generalized enlarged lymph nodes: Secondary | ICD-10-CM | POA: Diagnosis not present

## 2015-11-09 DIAGNOSIS — F329 Major depressive disorder, single episode, unspecified: Secondary | ICD-10-CM | POA: Diagnosis present

## 2015-11-09 DIAGNOSIS — D6861 Antiphospholipid syndrome: Secondary | ICD-10-CM | POA: Diagnosis present

## 2015-11-09 DIAGNOSIS — D122 Benign neoplasm of ascending colon: Secondary | ICD-10-CM | POA: Diagnosis not present

## 2015-11-09 DIAGNOSIS — Z7901 Long term (current) use of anticoagulants: Secondary | ICD-10-CM | POA: Diagnosis not present

## 2015-11-09 DIAGNOSIS — A409 Streptococcal sepsis, unspecified: Secondary | ICD-10-CM | POA: Diagnosis present

## 2015-11-09 DIAGNOSIS — K921 Melena: Secondary | ICD-10-CM | POA: Diagnosis not present

## 2015-11-09 DIAGNOSIS — D62 Acute posthemorrhagic anemia: Secondary | ICD-10-CM | POA: Diagnosis not present

## 2015-11-09 DIAGNOSIS — Z8673 Personal history of transient ischemic attack (TIA), and cerebral infarction without residual deficits: Secondary | ICD-10-CM | POA: Diagnosis not present

## 2015-11-09 DIAGNOSIS — J020 Streptococcal pharyngitis: Secondary | ICD-10-CM | POA: Diagnosis not present

## 2015-11-09 DIAGNOSIS — E119 Type 2 diabetes mellitus without complications: Secondary | ICD-10-CM | POA: Diagnosis present

## 2015-11-09 LAB — BASIC METABOLIC PANEL
ANION GAP: 5 (ref 5–15)
BUN: 11 mg/dL (ref 6–20)
CO2: 25 mmol/L (ref 22–32)
Calcium: 8.2 mg/dL — ABNORMAL LOW (ref 8.9–10.3)
Chloride: 110 mmol/L (ref 101–111)
Creatinine, Ser: 0.94 mg/dL (ref 0.44–1.00)
GFR calc non Af Amer: >60 mL/min (ref >60)
GLUCOSE: 106 mg/dL — AB (ref 65–99)
POTASSIUM: 3.4 mmol/L — AB (ref 3.5–5.1)
Sodium: 140 mmol/L (ref 135–145)

## 2015-11-09 LAB — LACTIC ACID, PLASMA: Lactic Acid, Venous: 1.1 mmol/L (ref 0.5–2.0)

## 2015-11-09 LAB — URINALYSIS COMPLETE WITH MICROSCOPIC (ARMC ONLY)
BACTERIA UA: NONE SEEN
BILIRUBIN URINE: NEGATIVE
Glucose, UA: NEGATIVE mg/dL
Hgb urine dipstick: NEGATIVE
Ketones, ur: NEGATIVE mg/dL
LEUKOCYTES UA: NEGATIVE
Nitrite: NEGATIVE
PH: 7 (ref 5.0–8.0)
PROTEIN: NEGATIVE mg/dL
Specific Gravity, Urine: 1.011 (ref 1.005–1.030)
WBC UA: NONE SEEN WBC/hpf (ref 0–5)

## 2015-11-09 LAB — IRON AND TIBC
IRON: 12 ug/dL — AB (ref 28–170)
Saturation Ratios: 3 % — ABNORMAL LOW (ref 10.4–31.8)
TIBC: 373 ug/dL (ref 250–450)
UIBC: 361 ug/dL

## 2015-11-09 LAB — INFLUENZA PANEL BY PCR (TYPE A & B)
H1N1FLUPCR: NOT DETECTED
INFLBPCR: NEGATIVE
Influenza A By PCR: NEGATIVE

## 2015-11-09 LAB — PREPARE RBC (CROSSMATCH)

## 2015-11-09 LAB — CBC
HEMATOCRIT: 22.7 % — AB (ref 35.0–47.0)
HEMOGLOBIN: 7 g/dL — AB (ref 12.0–16.0)
MCH: 17.5 pg — ABNORMAL LOW (ref 26.0–34.0)
MCHC: 30.9 g/dL — ABNORMAL LOW (ref 32.0–36.0)
MCV: 56.5 fL — AB (ref 80.0–100.0)
Platelets: 334 10*3/uL (ref 150–440)
RBC: 4.02 MIL/uL (ref 3.80–5.20)
RDW: 19.7 % — ABNORMAL HIGH (ref 11.5–14.5)
WBC: 14 10*3/uL — AB (ref 3.6–11.0)

## 2015-11-09 LAB — GLUCOSE, CAPILLARY
GLUCOSE-CAPILLARY: 104 mg/dL — AB (ref 65–99)
GLUCOSE-CAPILLARY: 136 mg/dL — AB (ref 65–99)
Glucose-Capillary: 98 mg/dL (ref 65–99)
Glucose-Capillary: 128 mg/dL — ABNORMAL HIGH (ref 65–99)

## 2015-11-09 LAB — VITAMIN B12: Vitamin B-12: 240 pg/mL (ref 180–914)

## 2015-11-09 LAB — ABO/RH: ABO/RH(D): O POS

## 2015-11-09 LAB — FOLATE: Folate: 6.6 ng/mL (ref >5.9)

## 2015-11-09 LAB — FERRITIN: Ferritin: 6 ng/mL — ABNORMAL LOW (ref 11–307)

## 2015-11-09 IMAGING — CT CT NECK W/ CM
2 of 3 series · 8 of 14 positions shown, 10 images · IV contrast (omnipaque)
Comparison: None.

CLINICAL DATA: Sore throat and fever today, treated for strep
throat 3 weeks ago. Globus sensation, assess pharyngitis. History of
diabetes, hypertension and stroke.

EXAM:
CT NECK WITH CONTRAST
TECHNIQUE: Multidetector CT imaging of the neck was performed using the
standard protocol following the bolus administration of intravenous
contrast.
CONTRAST:  75mL OMNIPAQUE IOHEXOL 300 MG/ML  SOLN

[Series 2: axial neck · axial · 0.62mm/px · z∈[-322,-208]mm · 3 of 115 slices shown]
[im 29/115  bone]
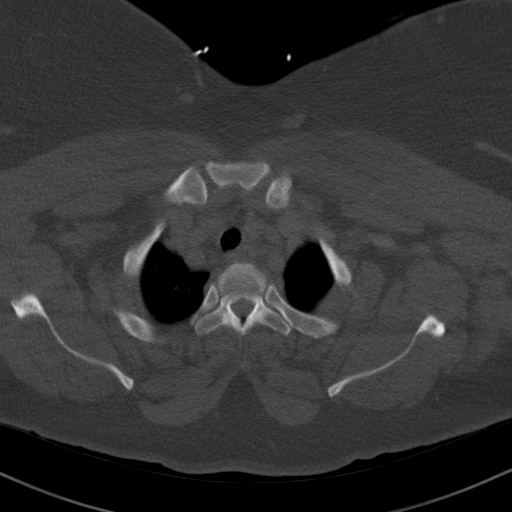
[im 58/115  bone]
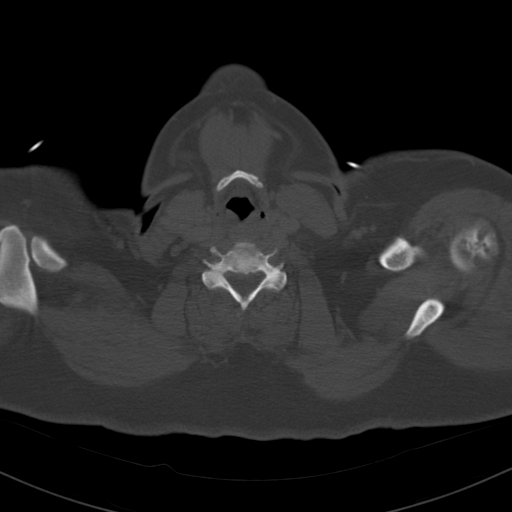
[im 86/115  bone]
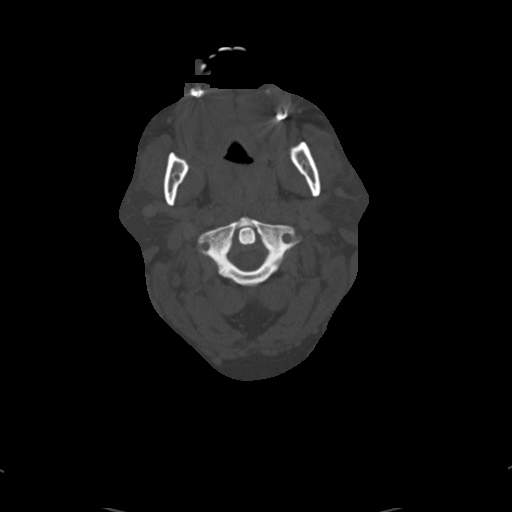

[Series 7: ax oropharynx · axial · 0.53mm/px · z∈[-379,-208]mm · 5 of 134 slices shown, 7 images]
[im 23/134  soft-tissue]
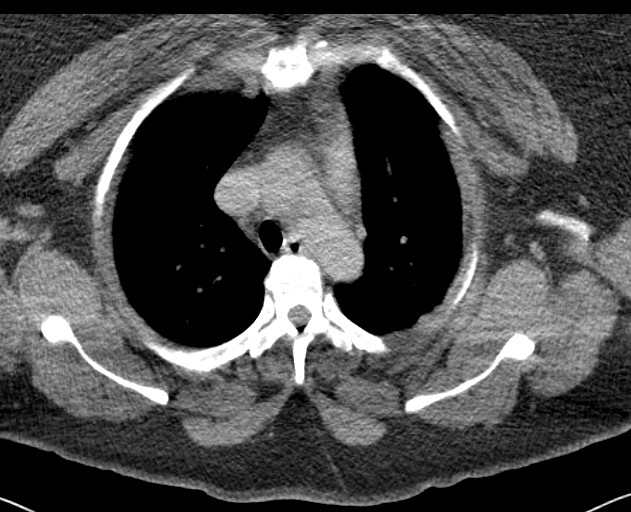
[im 23/134  bone]
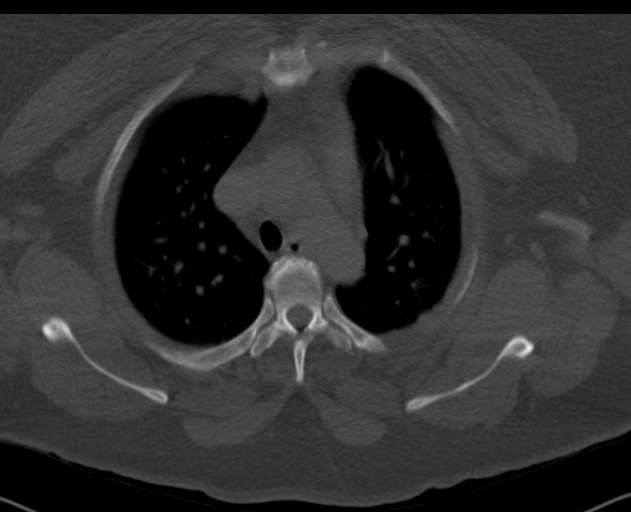
[im 45/134  bone]
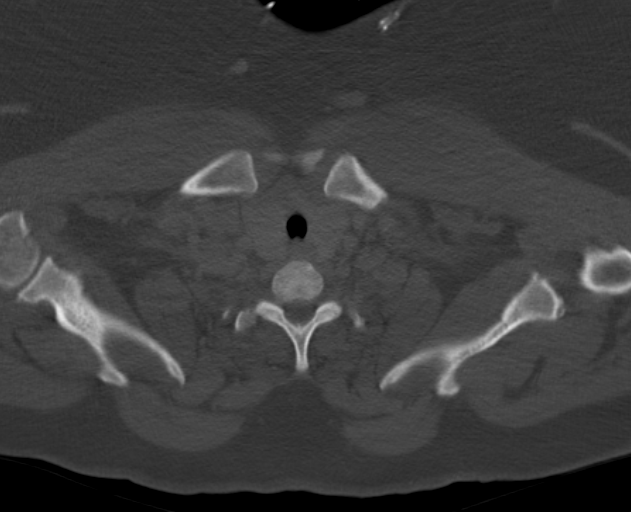
[im 67/134  bone]
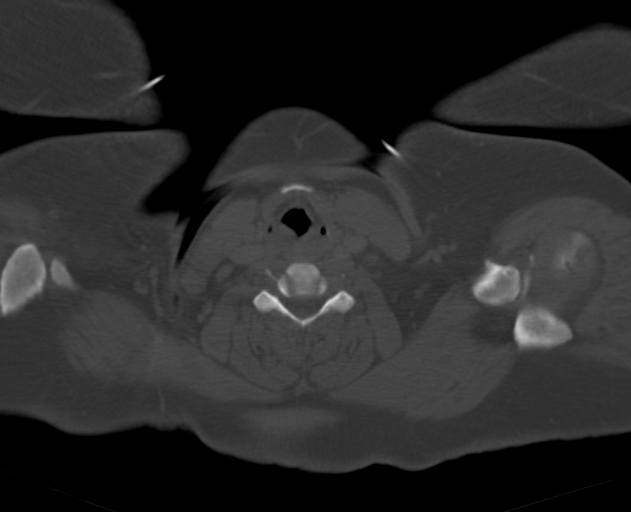
[im 89/134  bone]
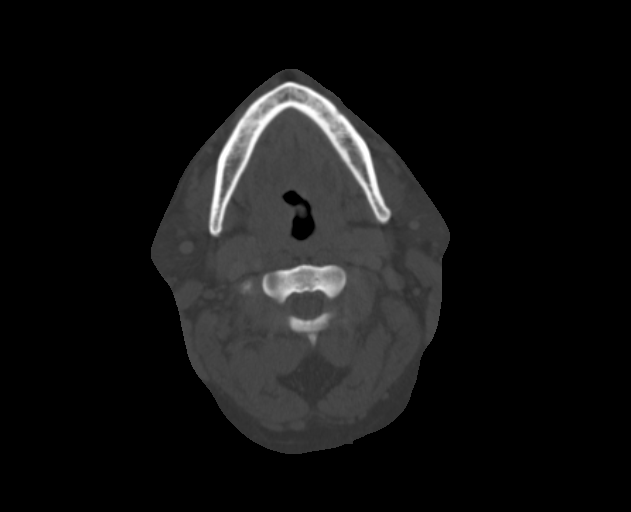
[im 111/134  soft-tissue]
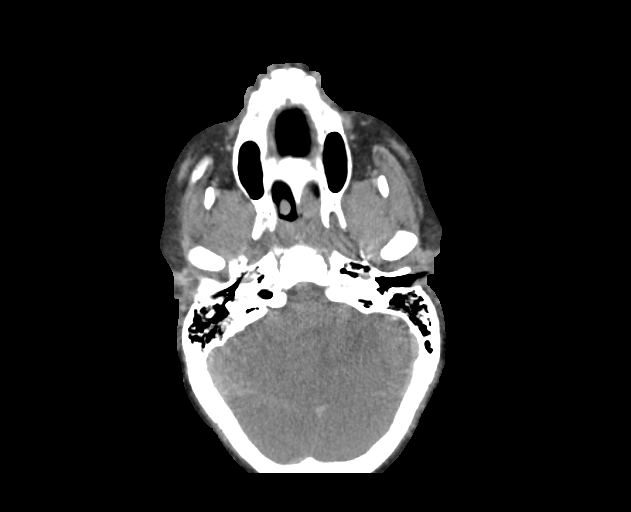
[im 111/134  bone]
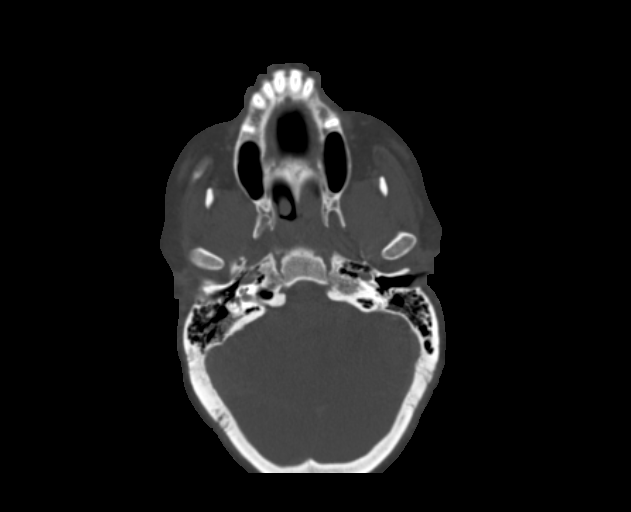

[8 of 14 positions shown; findings below may reference images not displayed]

FINDINGS: Large body habitus results in overall noisy image quality.

Pharynx and larynx: Enlarged adenoidal soft tissues. Normal
appearance of the pharynx and larynx. Patent airway.

Salivary glands: Fatty replaced parotid glands with small intra
parotid lymph nodes. Remaining major salivary glands are normal.

Thyroid: Normal.

Lymph nodes: Subcentimeter lymph nodes scattered throughout the neck
are likely reactive, in addition to RIGHT level IIa 15 mm short
access lymph node, LEFT level IIa 12 mm reniform lymph node, 12 mm
short access LEFT level IIb/ 3 lymph node.

Vascular: Normal.

Limited intracranial: Normal (multifocal infarcts seen on prior CT
not imaged on this neck examination.)

Visualized orbits: Normal.

Mastoids and visualized paranasal sinuses: Well aerated.

Skeleton: Straightened cervical lordosis without destructive bony
lesions. Multiple absent teeth.

Upper chest: Lung apices are clear.
IMPRESSION: Adenoidal hypertrophy can be seen with viral illness, including
immunocompromised states.

Mild lymphadenopathy throughout the neck is likely reactive.

## 2015-11-09 MED ORDER — PEG 3350-KCL-NABCB-NACL-NASULF 236 G PO SOLR
4000.0000 mL | Freq: Once | ORAL | Status: AC
  Administered 2015-11-09: 4000 mL via ORAL
  Filled 2015-11-09 (×2): qty 4000

## 2015-11-09 MED ORDER — PIPERACILLIN-TAZOBACTAM 4.5 G IVPB
4.5000 g | Freq: Three times a day (TID) | INTRAVENOUS | Status: DC
  Administered 2015-11-09: 4.5 g via INTRAVENOUS
  Filled 2015-11-09 (×3): qty 100

## 2015-11-09 MED ORDER — ACETAMINOPHEN 325 MG PO TABS
650.0000 mg | ORAL_TABLET | Freq: Four times a day (QID) | ORAL | Status: DC | PRN
Start: 2015-11-09 — End: 2015-11-11

## 2015-11-09 MED ORDER — SODIUM CHLORIDE 0.9 % IV SOLN
INTRAVENOUS | Status: DC
  Administered 2015-11-09 (×2): via INTRAVENOUS
  Administered 2015-11-10: 1000 mL via INTRAVENOUS
  Administered 2015-11-10: 1 mL via INTRAVENOUS
  Administered 2015-11-10: 02:00:00 via INTRAVENOUS

## 2015-11-09 MED ORDER — METHYLPREDNISOLONE SODIUM SUCC 40 MG IJ SOLR
20.0000 mg | Freq: Two times a day (BID) | INTRAMUSCULAR | Status: DC
  Administered 2015-11-09 – 2015-11-10 (×2): 20 mg via INTRAVENOUS
  Filled 2015-11-09 (×3): qty 1

## 2015-11-09 MED ORDER — SODIUM CHLORIDE 0.9 % IV SOLN: 20.0000 mg/kg | Freq: Two times a day (BID) | INTRAVENOUS | Status: DC

## 2015-11-09 MED ORDER — INSULIN ASPART 100 UNIT/ML ~~LOC~~ SOLN
0.0000 [IU] | Freq: Every day | SUBCUTANEOUS | Status: DC
  Filled 2015-11-09: qty 1

## 2015-11-09 MED ORDER — ONDANSETRON HCL 4 MG/2ML IJ SOLN: 4.0000 mg | Freq: Four times a day (QID) | INTRAMUSCULAR | Status: DC | PRN

## 2015-11-09 MED ORDER — SODIUM CHLORIDE 0.9 % IV SOLN
3.0000 g | Freq: Four times a day (QID) | INTRAVENOUS | Status: DC
  Administered 2015-11-09 – 2015-11-11 (×6): 3 g via INTRAVENOUS
  Filled 2015-11-09 (×11): qty 3

## 2015-11-09 MED ORDER — WARFARIN SODIUM 5 MG PO TABS: 5.0000 mg | ORAL_TABLET | Freq: Every day | ORAL | Status: DC

## 2015-11-09 MED ORDER — ONDANSETRON HCL 4 MG PO TABS: 4.0000 mg | ORAL_TABLET | Freq: Four times a day (QID) | ORAL | Status: DC | PRN

## 2015-11-09 MED ORDER — INSULIN ASPART 100 UNIT/ML ~~LOC~~ SOLN
0.0000 [IU] | Freq: Three times a day (TID) | SUBCUTANEOUS | Status: DC
  Administered 2015-11-09 – 2015-11-10 (×3): 1 [IU] via SUBCUTANEOUS
  Filled 2015-11-09: qty 1

## 2015-11-09 MED ORDER — SODIUM CHLORIDE 0.9 % IV SOLN
Freq: Once | INTRAVENOUS | Status: AC
  Administered 2015-11-09: 16:00:00 via INTRAVENOUS

## 2015-11-09 MED ORDER — AMLODIPINE BESYLATE 5 MG PO TABS
5.0000 mg | ORAL_TABLET | Freq: Every day | ORAL | Status: DC
  Administered 2015-11-09 – 2015-11-11 (×2): 5 mg via ORAL
  Filled 2015-11-09 (×2): qty 1

## 2015-11-09 MED ORDER — MORPHINE SULFATE (PF) 2 MG/ML IV SOLN: 2.0000 mg | INTRAVENOUS | Status: DC | PRN

## 2015-11-09 MED ORDER — METOPROLOL TARTRATE 25 MG PO TABS
25.0000 mg | ORAL_TABLET | Freq: Two times a day (BID) | ORAL | Status: DC
  Administered 2015-11-09 – 2015-11-11 (×6): 25 mg via ORAL
  Filled 2015-11-09 (×6): qty 1

## 2015-11-09 MED ORDER — SODIUM CHLORIDE 0.9 % IJ SOLN
3.0000 mL | Freq: Two times a day (BID) | INTRAMUSCULAR | Status: DC
  Administered 2015-11-09 – 2015-11-10 (×2): 3 mL via INTRAVENOUS

## 2015-11-09 MED ORDER — ACETAMINOPHEN 650 MG RE SUPP: 650.0000 mg | Freq: Four times a day (QID) | RECTAL | Status: DC | PRN

## 2015-11-09 MED ORDER — OXYCODONE HCL 5 MG PO TABS: 5.0000 mg | ORAL_TABLET | ORAL | Status: DC | PRN

## 2015-11-09 MED ORDER — IOHEXOL 300 MG/ML  SOLN
75.0000 mL | Freq: Once | INTRAMUSCULAR | Status: AC | PRN
Start: 2015-11-09 — End: 2015-11-09
  Administered 2015-11-09: 75 mL via INTRAVENOUS

## 2015-11-09 NOTE — Progress Notes (Addendum)
ANTICOAGULATION CONSULT NOTE - Initial Consult  Pharmacy Consult for warfarin dosing Indication: VTE prophylaxis  No Known Allergies  Patient Measurements: Height: 5\' 5"  (165.1 cm) Weight: 300 lb (136.079 kg) IBW/kg (Calculated) : 57 Heparin Dosing Weight: n/a  Vital Signs: Temp: 98.7 F (37.1 C) (11/21 0209) Temp Source: Oral (11/21 0209) BP: 140/67 mmHg (11/21 0209) Pulse Rate: 113 (11/21 0115)  Labs:  Recent Labs  11/08/15 2256  HGB 8.0*  HCT 25.8*  PLT 384  LABPROT 25.7*  INR 2.38  CREATININE 1.07*  TROPONINI <0.03    Estimated Creatinine Clearance: 88 mL/min (by C-G formula based on Cr of 1.07).   Medical History: Past Medical History  Diagnosis Date  . Hypertension   . Other and unspecified ovarian cysts   . Stroke ([place])   . Diabetes mellitus without complication ([facility])     Medications:    Assessment: Hgb 8.0  INR 2.38  Goal of Therapy:  INR 2-3 Monitor platelets by anticoagulation protocol: Yes   Plan:  Home dose of 5 mg daily ordered. INR ordered daily while on abx.  Pharmacy will follow and manage.  Batul Diego S 11/09/2015,3:04 AM

## 2015-11-09 NOTE — Progress Notes (Signed)
[place] at [facility]                                                                                                                                                                                            Patient Demographics   Mary [name], is a 49 y.o. female, DOB - [birth year], [id number]  Admit date - 11/08/2015   Admitting Physician Lytle Butte, MD  Outpatient Primary MD for the patient is Lorayne Marek, MD   LOS - 0  Subjective:     Review of Systems:   CONSTITUTIONAL: No documented fever. No fatigue, weakness. No weight gain, no weight loss.  EYES: No blurry or double vision.  ENT: No tinnitus. No postnasal drip. No redness of the oropharynx.  RESPIRATORY: No cough, no wheeze, no hemoptysis. No dyspnea.  CARDIOVASCULAR: No chest pain. No orthopnea. No palpitations. No syncope.  GASTROINTESTINAL: No nausea, no vomiting or diarrhea. No abdominal pain. No melena or hematochezia.  GENITOURINARY: No dysuria or hematuria.  ENDOCRINE: No polyuria or nocturia. No heat or cold intolerance.  HEMATOLOGY: No anemia. No bruising. No bleeding.  INTEGUMENTARY: No rashes. No lesions.  MUSCULOSKELETAL: No arthritis. No swelling. No gout.  NEUROLOGIC: No numbness, tingling, or ataxia. No seizure-type activity.  PSYCHIATRIC: No anxiety. No insomnia. No ADD.    Vitals:   Filed Vitals:   11/09/15 0045 11/09/15 0115 11/09/15 0209 11/09/15 1108  BP:   140/67 140/70  Pulse: 109 113  92  Temp:   98.7 F (37.1 C)   TempSrc:   Oral   Resp: 26 29 18    Height:      Weight:      SpO2: 99% 98% 99%     Wt Readings from Last 3 Encounters:  11/08/15 136.079 kg (300 lb)  09/30/15 136.079 kg (300 lb)  07/22/15 136.079 kg (300 lb)     Intake/Output Summary (Last 24 hours) at 11/09/15 1358 Last data filed at 11/09/15 0700  Gross per 24 hour  Intake    400 ml  Output    900 ml  Net   -500 ml    Physical Exam:   GENERAL:  Pleasant-appearing in no apparent distress.  HEAD, EYES, EARS, NOSE AND THROAT: Atraumatic, normocephalic. Extraocular muscles are intact. Pupils equal and reactive to light. Sclerae anicteric. No conjunctival injection. No oro-pharyngeal erythema.  NECK: Supple. There is no jugular venous distention. No bruits, no lymphadenopathy, no thyromegaly.  HEART: Regular rate and rhythm,. No murmurs, no rubs, no clicks.  LUNGS: Clear to auscultation bilaterally. No rales or rhonchi. No wheezes.  ABDOMEN: Soft, flat, nontender,  nondistended. Has good bowel sounds. No hepatosplenomegaly appreciated.  EXTREMITIES: No evidence of any cyanosis, clubbing, or peripheral edema.  +2 pedal and radial pulses bilaterally.  NEUROLOGIC: The patient is alert, awake, and oriented x3 with no focal motor or sensory deficits appreciated bilaterally.  SKIN: Moist and warm with no rashes appreciated.  Psych: Not anxious, depressed LN: No inguinal LN enlargement    Antibiotics   Anti-infectives    Start     Dose/Rate Route Frequency Ordered Stop   11/09/15 1400  Ampicillin-Sulbactam (UNASYN) 3 g in sodium chloride 0.9 % 100 mL IVPB     3 g 100 mL/hr over 60 Minutes Intravenous Every 6 hours 11/09/15 1338     11/09/15 0600  piperacillin-tazobactam (ZOSYN) IVPB 4.5 g  Status:  Discontinued     4.5 g 25 mL/hr over 240 Minutes Intravenous 3 times per day 11/09/15 0223 11/09/15 1338   11/08/15 2345  vancomycin (VANCOCIN) 1,500 mg in sodium chloride 0.9 % 500 mL IVPB  Status:  Discontinued     1,500 mg 250 mL/hr over 120 Minutes Intravenous  Once 11/08/15 2336 11/09/15 0258   11/08/15 2300  piperacillin-tazobactam (ZOSYN) IVPB 3.375 g     3.375 g 100 mL/hr over 30 Minutes Intravenous  Once 11/08/15 2254 11/09/15 0056   11/08/15 2300  vancomycin (VANCOCIN) IVPB 1000 mg/200 mL premix  Status:  Discontinued     1,000 mg 200 mL/hr over 60 Minutes Intravenous  Once 11/08/15 2254 11/08/15 2336   11/08/15 2130  cephALEXin  (KEFLEX) capsule 500 mg     500 mg Oral  Once 11/08/15 2123 11/08/15 2156      Medications   Scheduled Meds: . sodium chloride   Intravenous Once  . amLODipine  5 mg Oral Daily  . ampicillin-sulbactam (UNASYN) IV  3 g Intravenous Q6H  . insulin aspart  0-5 Units Subcutaneous QHS  . insulin aspart  0-9 Units Subcutaneous TID WC  . methylPREDNISolone (SOLU-MEDROL) injection  20 mg Intravenous Q12H  . metoprolol tartrate  25 mg Oral BID  . sodium chloride  3 mL Intravenous Q12H   Continuous Infusions: . sodium chloride 100 mL/hr at 11/09/15 0334   PRN Meds:.acetaminophen **OR** acetaminophen, morphine injection, ondansetron **OR** ondansetron (ZOFRAN) IV, oxyCODONE   Data Review:   Micro Results No results found for this or any previous visit (from the past 240 hour(s)).  Radiology Reports Dg Chest 2 View  11/08/2015  CLINICAL DATA:  Acute onset of sore throat for 2 days, with chills. Initial encounter. EXAM: CHEST  2 VIEW COMPARISON:  Chest radiograph performed 01/23/2013 FINDINGS: The lungs are well-aerated. Minimal bilateral atelectasis is noted. There is no evidence of pleural effusion or pneumothorax. The heart is normal in size; a loop recorder is noted. No acute osseous abnormalities are seen. Clips are noted within the right upper quadrant, reflecting prior cholecystectomy. IMPRESSION: Minimal bilateral atelectasis noted.  Lungs otherwise clear. Electronically Signed   By: Garald Balding M.D.   On: 11/08/2015 23:16   Ct Soft Tissue Neck W Contrast  11/09/2015  CLINICAL DATA:  Sore throat and fever today, treated for strep throat 3 weeks ago. Globus sensation, assess pharyngitis. History of diabetes, hypertension and stroke. EXAM: CT NECK WITH CONTRAST TECHNIQUE: Multidetector CT imaging of the neck was performed using the standard protocol following the bolus administration of intravenous contrast. CONTRAST:  25mL OMNIPAQUE IOHEXOL 300 MG/ML  SOLN COMPARISON:  None. FINDINGS:  Large body habitus results in overall noisy image quality. Pharynx  and larynx: Enlarged adenoidal soft tissues. Normal appearance of the pharynx and larynx. Patent airway. Salivary glands: Fatty replaced parotid glands with small intra parotid lymph nodes. Remaining major salivary glands are normal. Thyroid: Normal. Lymph nodes: Subcentimeter lymph nodes scattered throughout the neck are likely reactive, in addition to RIGHT level IIa 15 mm short access lymph node, LEFT level IIa 12 mm reniform lymph node, 12 mm short access LEFT level IIb/ 3 lymph node. Vascular: Normal. Limited intracranial: Normal (multifocal infarcts seen on prior CT not imaged on this neck examination.) Visualized orbits: Normal. Mastoids and visualized paranasal sinuses: Well aerated. Skeleton: Straightened cervical lordosis without destructive bony lesions. Multiple absent teeth. Upper chest: Lung apices are clear. IMPRESSION: Adenoidal hypertrophy can be seen with viral illness, including immunocompromised states. Mild lymphadenopathy throughout the neck is likely reactive. Electronically Signed   By: Elon Alas M.D.   On: 11/09/2015 01:23     CBC  Recent Labs Lab 11/08/15 2256 11/09/15 0453  WBC 13.3* 14.0*  HGB 8.0* 7.0*  HCT 25.8* 22.7*  PLT 384 334  MCV 56.5* 56.5*  MCH 17.5* 17.5*  MCHC 31.0* 30.9*  RDW 20.1* 19.7*  LYMPHSABS 1.9  --   MONOABS 0.5  --   EOSABS 0.0  --   BASOSABS 0.1  --     Chemistries   Recent Labs Lab 11/08/15 2256 11/09/15 0453  NA 139 140  K 3.5 3.4*  CL 108 110  CO2 24 25  GLUCOSE 128* 106*  BUN 13 11  CREATININE 1.07* 0.94  CALCIUM 8.7* 8.2*  AST 25  --   ALT 16  --   ALKPHOS 57  --   BILITOT 0.5  --    ------------------------------------------------------------------------------------------------------------------ estimated creatinine clearance is 100.1 mL/min (by C-G formula based on Cr of  0.94). ------------------------------------------------------------------------------------------------------------------ No results for input(s): HGBA1C in the last 72 hours. ------------------------------------------------------------------------------------------------------------------ No results for input(s): CHOL, HDL, LDLCALC, TRIG, CHOLHDL, LDLDIRECT in the last 72 hours. ------------------------------------------------------------------------------------------------------------------ No results for input(s): TSH, T4TOTAL, T3FREE, THYROIDAB in the last 72 hours.  Invalid input(s): FREET3 ------------------------------------------------------------------------------------------------------------------  Recent Labs  11/08/15 2256 11/09/15 0453  FOLATE  --  6.6  FERRITIN  --  6*  TIBC  --  373  IRON  --  12*  RETICCTPCT 1.3  --     Coagulation profile  Recent Labs Lab 11/08/15 2256  INR 2.38    No results for input(s): DDIMER in the last 72 hours.  Cardiac Enzymes  Recent Labs Lab 11/08/15 2256  TROPONINI <0.03   ------------------------------------------------------------------------------------------------------------------ Invalid input(s): POCBNP    Assessment & Plan   49 year old African-American female history of type 2 diabetes non-insulin-requiring presenting with sore throat, and emergency department meeting septic criteria  1.Sepsis, due to severe pharyngitis, changed to Unasyn, due to significant amount of swelling I will start patient on some IV Solu-Medrol.    2. Severe anemia. With guiac positive stools on chronic Coumadin therapy I will hold her Coumadin, we'll transfuse her 1 unit of packed RBCs risk and benefits of transfusion explained patient agreeable to transfusion. Suspect GI blood loss GI consult placed on Protonix and IV twice a day 3. Type 2 diabetes non-insulin-requiring: Hold oral agents acd sliding scale coverage 4. Essential  hypertension: Continue Lopressor Norvasc 5. Venous thromboembolism prophylactic: Due to acute GI bleed use SCDs    Code Status Orders        Start     Ordered   11/09/15 0003  Full code   Continuous  11/09/15 0002           Consults  GI   DVT Prophylaxis SCDs  Lab Results  Component Value Date   PLT 334 11/09/2015     Time Spent in minutes   35 minutes  Dustin Flock M.D on 11/09/2015 at 1:58 PM  Between 7am to 6pm - Pager - [phone]  After 6pm go to [email or web address] - password [email or web address] [place] [facility]   Office  [phone]

## 2015-11-09 NOTE — H&P (Signed)
[place] [facility] [place] NAME: Mary [name]    MR#:  [id number]  DATE OF BIRTH:  Jan 15, 1966   DATE OF ADMISSION:  11/08/2015  PRIMARY CARE PHYSICIAN: Lorayne Marek, MD   REQUESTING/REFERRING PHYSICIAN: Beather Arbour  CHIEF COMPLAINT:   Chief Complaint  Patient presents with  . Sore Throat    HISTORY OF PRESENT ILLNESS:  Mary [name]  is a 49 y.o. female with a known history of type 2 diabetes non-insulin-requiring who is presenting with sore throat chills. She describes 2 day duration of worsening sore throat interfering with swallowing, no issues with breathing or speech. She has associated chills denies fevers. Of note October 14 diagnosed with strep throat completed a course of treatment with complete resolution of symptoms. Given worsening symptoms decided to present to Hospital further workup and evaluation. Emergency department course rapid strep test positive, meeting septic criteria, fecal occult blood test positive When asked about bloody stools she states she has intermittent history of bleeding secondary to hemorrhoids however has not had any active bleed lately  PAST MEDICAL HISTORY:   Past Medical History  Diagnosis Date  . Hypertension   . Other and unspecified ovarian cysts   . Stroke [facility])     PAST SURGICAL HISTORY:   Past Surgical History  Procedure Laterality Date  . Ovarian cyst removal    . Tee without cardioversion N/A 01/25/2013    Procedure: TRANSESOPHAGEAL ECHOCARDIOGRAM (TEE);  Surgeon: Birdie Riddle, MD;  Location: [place];  Service: Cardiovascular;  Laterality: N/A;  . Loop recorder implant N/A 04/25/2014    Procedure: LOOP RECORDER IMPLANT;  Surgeon: Coralyn Mark, MD;  Location: [facility] CATH LAB;  Service: Cardiovascular;  Laterality: N/A;    SOCIAL HISTORY:   Social History  Substance Use Topics  . Smoking status: Never Smoker   . Smokeless tobacco: Never Used  . Alcohol Use: No    FAMILY  HISTORY:   Family History  Problem Relation Age of Onset  . Diabetes Mellitus II Mother   . Diabetes Mother   . Stroke Other   . Diabetes Other   . Transient ischemic attack Father   . Diabetes Sister     DRUG ALLERGIES:  No Known Allergies  REVIEW OF SYSTEMS:  REVIEW OF SYSTEMS:  CONSTITUTIONAL: Denies fevers, positive chills, fatigue, weakness.  EYES: Denies blurred vision, double vision, or eye pain.  EARS, NOSE, THROAT: Denies tinnitus, ear pain, hearing loss. Positive sore throat RESPIRATORY: denies cough, shortness of breath, wheezing  CARDIOVASCULAR: Denies chest pain, palpitations, edema.  GASTROINTESTINAL: Denies nausea, vomiting, diarrhea, abdominal pain.  GENITOURINARY: Denies dysuria, hematuria.  ENDOCRINE: Denies nocturia or thyroid problems. HEMATOLOGIC AND LYMPHATIC: Denies easy bruising or bleeding.  SKIN: Denies rash or lesions.  MUSCULOSKELETAL: Denies pain in neck, back, shoulder, knees, hips, or further arthritic symptoms.  NEUROLOGIC: Denies paralysis, paresthesias.  PSYCHIATRIC: Denies anxiety or depressive symptoms. Otherwise full review of systems performed by me is negative.   MEDICATIONS AT HOME:   Prior to Admission medications   Medication Sig Start Date End Date Taking? Authorizing Provider  amLODipine (NORVASC) 5 MG tablet Take 5 mg by mouth daily.   Yes Historical Provider, MD  metFORMIN (GLUCOPHAGE) 500 MG tablet Take 1 tablet (500 mg total) by mouth 2 (two) times daily with a meal. 07/23/15  Yes Lorayne Marek, MD  metoprolol tartrate (LOPRESSOR) 25 MG tablet Take 1 tablet (25 mg total) by mouth 2 (two) times daily. 09/10/15  Yes Tresa Garter, MD  tacrolimus (PROTOPIC) 0.1 % ointment Apply 1 application topically 2 (two) times daily as needed (dermatitis).  07/14/14  Yes Historical Provider, MD  triamcinolone cream (KENALOG) 0.1 % Apply 1 application topically as needed (itching).  07/11/14  Yes Historical Provider, MD  warfarin (COUMADIN) 5  MG tablet Take as directed by [facility] Patient taking differently: Take 5 mg by mouth at bedtime. Take as directed by [facility] 10/14/15  Yes Thompson Grayer, MD  ondansetron (ZOFRAN) 4 MG tablet Take 1 tablet (4 mg total) by mouth daily as needed for nausea or vomiting. Patient not taking: Reported on 11/09/2015 09/30/15   Schuyler Amor, MD      VITAL SIGNS:  Blood pressure 142/67, pulse 119, temperature 101.2 F (38.4 C), temperature source Oral, resp. rate 21, height 5\' 5"  (1.651 m), weight 300 lb (136.079 kg), last menstrual period 09/21/2015, SpO2 100 %.  PHYSICAL EXAMINATION:  VITAL SIGNS: Filed Vitals:   11/08/15 2354 11/09/15 0002  BP: 146/78 142/67  Pulse: 120 119  Temp:    Resp: 28 21   GENERAL:49 y.o.female currently in no acute distress.  HEAD: Normocephalic, atraumatic.  EYES: Pupils equal, round, reactive to light. Extraocular muscles intact. No scleral icterus.  MOUTH: Moist mucosal membrane. Dentition intact. No abscess noted. Unable to appropriately visualize posterior pharynx, there is some minimal erythema  EAR, NOSE, THROAT: Clear without exudates. No external lesions.  NECK: Supple. No thyromegaly. No nodules. No JVD.  PULMONARY: Clear to ascultation, without wheeze rails or rhonci. No use of accessory muscles, Good respiratory effort. good air entry bilaterally CHEST: Nontender to palpation.  CARDIOVASCULAR: S1 and S2. Tachycardic . No murmurs, rubs, or gallops. No edema. Pedal pulses 2+ bilaterally.  GASTROINTESTINAL: Obese Soft, nontender, nondistended. No masses. Positive bowel sounds. No hepatosplenomegaly.  MUSCULOSKELETAL: No swelling, clubbing, or edema. Range of motion full in all extremities.  NEUROLOGIC: Cranial nerves II through XII are intact. No gross focal neurological deficits. Sensation intact. Reflexes intact.  SKIN: No ulceration, lesions, rashes, or cyanosis. Skin warm and dry. Turgor intact.  PSYCHIATRIC: Mood, affect within  normal limits. The patient is awake, alert and oriented x 3. Insight, judgment intact.    LABORATORY PANEL:   CBC  Recent Labs Lab 11/08/15 2256  WBC 13.3*  HGB 8.0*  HCT 25.8*  PLT 384   ------------------------------------------------------------------------------------------------------------------  Chemistries   Recent Labs Lab 11/08/15 2256  NA 139  K 3.5  CL 108  CO2 24  GLUCOSE 128*  BUN 13  CREATININE 1.07*  CALCIUM 8.7*  AST 25  ALT 16  ALKPHOS 57  BILITOT 0.5   ------------------------------------------------------------------------------------------------------------------  Cardiac Enzymes  Recent Labs Lab 11/08/15 2256  TROPONINI <0.03   ------------------------------------------------------------------------------------------------------------------  RADIOLOGY:  Dg Chest 2 View  11/08/2015  CLINICAL DATA:  Acute onset of sore throat for 2 days, with chills. Initial encounter. EXAM: CHEST  2 VIEW COMPARISON:  Chest radiograph performed 01/23/2013 FINDINGS: The lungs are well-aerated. Minimal bilateral atelectasis is noted. There is no evidence of pleural effusion or pneumothorax. The heart is normal in size; a loop recorder is noted. No acute osseous abnormalities are seen. Clips are noted within the right upper quadrant, reflecting prior cholecystectomy. IMPRESSION: Minimal bilateral atelectasis noted.  Lungs otherwise clear. Electronically Signed   By: Garald Balding M.D.   On: 11/08/2015 23:16    EKG:   Orders placed or performed during the hospital encounter of 11/08/15  . ED EKG 12-Lead  .  ED EKG 12-Lead  . EKG 12-Lead  . EKG 12-Lead    IMPRESSION AND PLAN:   49 year old African-American female history of type 2 diabetes non-insulin-requiring presenting with sore throat, and emergency department meeting septic criteria  1.Sepsis, meeting septic criteria by tachycardia leukocytosis temperature present on arrival. Source positive strep  throat Code sepsis initiated. Panculture. Broad-spectrum antibiotics including vancomycin/Zosyn in emergency department will discontinue vancomycin and taper remainder antibiotics when culture data returns. He has received a 30 mL/kg IV fluid bolus. Continue IV fluid hydration to keep mean arterial pressure greater than 65. We'll check a imaging of the neck looking for abscess formation given sepsis 2. Fecal occult blood test positive: No active GI bleeding, will follow CBC consult gastroenterology 3. Type 2 diabetes non-insulin-requiring: Hold oral agents acd sliding scale coverage 4. Essential hypertension: Lopressor Norvasc 5. Venous thromboembolism prophylactic: Therapeutic warfarin    All the records are reviewed and case discussed with ED provider. Management plans discussed with the patient, family and they are in agreement.  CODE STATUS: Full  TOTAL TIME TAKING CARE OF THIS PATIENT: 35 minutes.    Hower,  Karenann Cai.D on 11/09/2015 at 12:37 AM  Between 7am to 6pm - Pager - [phone]  After 6pm: House Pager: - [phone]  [facility]  Office  [phone]  CC: Primary care physician; Lorayne Marek, MD

## 2015-11-09 NOTE — Progress Notes (Signed)
ANTIBIOTIC CONSULT NOTE - INITIAL  Pharmacy Consult for Zosyn dosing Indication: sepsis  No Known Allergies  Patient Measurements: Height: 5\' 5"  (165.1 cm) Weight: 300 lb (136.079 kg) IBW/kg (Calculated) : 57 Adjusted Body Weight: n/a  Vital Signs: Temp: 98.7 F (37.1 C) (11/21 0209) Temp Source: Oral (11/21 0209) BP: 140/67 mmHg (11/21 0209) Pulse Rate: 113 (11/21 0115) Intake/Output from previous day:   Intake/Output from this shift:    Labs:  Recent Labs  11/08/15 2256  WBC 13.3*  HGB 8.0*  PLT 384  CREATININE 1.07*   Estimated Creatinine Clearance: 88 mL/min (by C-G formula based on Cr of 1.07). No results for input(s): VANCOTROUGH, VANCOPEAK, VANCORANDOM, GENTTROUGH, GENTPEAK, GENTRANDOM, TOBRATROUGH, TOBRAPEAK, TOBRARND, AMIKACINPEAK, AMIKACINTROU, AMIKACIN in the last 72 hours.   Microbiology: No results found for this or any previous visit (from the past 720 hour(s)).  Medical History: Past Medical History  Diagnosis Date  . Hypertension   . Other and unspecified ovarian cysts   . Stroke ([place])   . Diabetes mellitus without complication (HCC)     Medications:   Assessment: UA: pending Blood and urine cx pending CXR: minimal atelectasis  Goal of Therapy:  Resolution of infection  Plan:  Zosyn 4.5 gram q 8 hours ordered for TBW >120kg.   Sim Boast, PharmD, BCPS  11/09/2015

## 2015-11-09 NOTE — Consult Note (Signed)
[facility]  [address], [place], [place] [address] Phone: [phone] Fax : [phone]  Consultation  Referring Provider:     No ref. provider found Primary Care Physician:  Lorayne Marek, MD Primary Gastroenterologist:           Reason for Consultation:     Anemia with heme positive stools  Date of Admission:  11/08/2015 Date of Consultation:  11/09/2015         HPI:   Mary [name] is a 49 y.o. female this patient comes in with a diagnosis of sepsis. The patient states she was feeling fine until the morning of yesterday when she had a very sore throat and came to the hospital. The patient denies any Advil Aleve Motrin or BC powder use. She also states she only takes Tylenol when she has pain. The patient was found to have heme positive stools and a hemoglobin that was 10 about a month ago which had been 8 on admission and is now 7 this morning. The patient denies any black stools or bloody stools. She also denies any abdominal pain nausea vomiting fevers or chills.  Past Medical History  Diagnosis Date  . Hypertension   . Other and unspecified ovarian cysts   . Stroke ([place])   . Diabetes mellitus without complication [facility])     Past Surgical History  Procedure Laterality Date  . Ovarian cyst removal    . Tee without cardioversion N/A 01/25/2013    Procedure: TRANSESOPHAGEAL ECHOCARDIOGRAM (TEE);  Surgeon: Birdie Riddle, MD;  Location: [place];  Service: Cardiovascular;  Laterality: N/A;  . Loop recorder implant N/A 04/25/2014    Procedure: LOOP RECORDER IMPLANT;  Surgeon: Coralyn Mark, MD;  Location: [facility] CATH LAB;  Service: Cardiovascular;  Laterality: N/A;    Prior to Admission medications   Medication Sig Start Date End Date Taking? Authorizing Provider  amLODipine (NORVASC) 5 MG tablet Take 5 mg by mouth daily.   Yes Historical Provider, MD  metFORMIN (GLUCOPHAGE) 500 MG tablet Take 1 tablet (500 mg total) by mouth 2 (two) times daily with a  meal. 07/23/15  Yes Lorayne Marek, MD  metoprolol tartrate (LOPRESSOR) 25 MG tablet Take 1 tablet (25 mg total) by mouth 2 (two) times daily. 09/10/15  Yes Tresa Garter, MD  tacrolimus (PROTOPIC) 0.1 % ointment Apply 1 application topically 2 (two) times daily as needed (dermatitis).  07/14/14  Yes Historical Provider, MD  triamcinolone cream (KENALOG) 0.1 % Apply 1 application topically as needed (itching).  07/11/14  Yes Historical Provider, MD  warfarin (COUMADIN) 5 MG tablet Take as directed by [facility] Patient taking differently: Take 5 mg by mouth at bedtime. Take as directed by [facility] 10/14/15  Yes Thompson Grayer, MD  ondansetron (ZOFRAN) 4 MG tablet Take 1 tablet (4 mg total) by mouth daily as needed for nausea or vomiting. Patient not taking: Reported on 11/09/2015 09/30/15   Schuyler Amor, MD    Family History  Problem Relation Age of Onset  . Diabetes Mellitus II Mother   . Diabetes Mother   . Stroke Other   . Diabetes Other   . Transient ischemic attack Father   . Diabetes Sister      Social History  Substance Use Topics  . Smoking status: Never Smoker   . Smokeless tobacco: Never Used  . Alcohol Use: No    Allergies as of 11/08/2015  . (No Known Allergies)    Review of Systems:  All systems reviewed and negative except where noted in HPI.   Physical Exam:  Vital signs in last 24 hours: Temp:  [98.5 F (36.9 C)-101.2 F (38.4 C)] 98.7 F (37.1 C) (11/21 0209) Pulse Rate:  [92-126] 92 (11/21 1108) Resp:  [18-29] 18 (11/21 0209) BP: (134-149)/(67-78) 140/70 mmHg (11/21 1108) SpO2:  [98 %-100 %] 99 % (11/21 0209) Weight:  [300 lb (136.079 kg)] 300 lb (136.079 kg) (11/20 1906)   General:   Pleasant, cooperative in NAD Head:  Normocephalic and atraumatic. Eyes:   No icterus.   Conjunctiva pink. PERRLA. Ears:  Normal auditory acuity. Neck:  Supple; no masses or thyroidomegaly Lungs: Respirations even and unlabored. Lungs clear to  auscultation bilaterally.   No wheezes, crackles, or rhonchi.  Heart:  Regular rate and rhythm;  Without murmur, clicks, rubs or gallops Abdomen:  Soft, nondistended, nontender. Normal bowel sounds. No appreciable masses or hepatomegaly.  No rebound or guarding.  Rectal:  Not performed. Msk:  Symmetrical without gross deformities.    Extremities:  Without edema, cyanosis or clubbing. Neurologic:  Alert and oriented x3;  grossly normal neurologically. Skin:  Intact without significant lesions or rashes. Cervical Nodes:  No significant cervical adenopathy. Psych:  Alert and cooperative. Normal affect.  LAB RESULTS:  Recent Labs  11/08/15 2256 11/09/15 0453  WBC 13.3* 14.0*  HGB 8.0* 7.0*  HCT 25.8* 22.7*  PLT 384 334   BMET  Recent Labs  11/08/15 2256 11/09/15 0453  NA 139 140  K 3.5 3.4*  CL 108 110  CO2 24 25  GLUCOSE 128* 106*  BUN 13 11  CREATININE 1.07* 0.94  CALCIUM 8.7* 8.2*   LFT  Recent Labs  11/08/15 2256  PROT 8.7*  ALBUMIN 3.5  AST 25  ALT 16  ALKPHOS 57  BILITOT 0.5   PT/INR  Recent Labs  11/08/15 2256  LABPROT 25.7*  INR 2.38    STUDIES: Dg Chest 2 View  11/08/2015  CLINICAL DATA:  Acute onset of sore throat for 2 days, with chills. Initial encounter. EXAM: CHEST  2 VIEW COMPARISON:  Chest radiograph performed 01/23/2013 FINDINGS: The lungs are well-aerated. Minimal bilateral atelectasis is noted. There is no evidence of pleural effusion or pneumothorax. The heart is normal in size; a loop recorder is noted. No acute osseous abnormalities are seen. Clips are noted within the right upper quadrant, reflecting prior cholecystectomy. IMPRESSION: Minimal bilateral atelectasis noted.  Lungs otherwise clear. Electronically Signed   By: Garald Balding M.D.   On: 11/08/2015 23:16   Ct Soft Tissue Neck W Contrast  11/09/2015  CLINICAL DATA:  Sore throat and fever today, treated for strep throat 3 weeks ago. Globus sensation, assess pharyngitis.  History of diabetes, hypertension and stroke. EXAM: CT NECK WITH CONTRAST TECHNIQUE: Multidetector CT imaging of the neck was performed using the standard protocol following the bolus administration of intravenous contrast. CONTRAST:  45mL OMNIPAQUE IOHEXOL 300 MG/ML  SOLN COMPARISON:  None. FINDINGS: Large body habitus results in overall noisy image quality. Pharynx and larynx: Enlarged adenoidal soft tissues. Normal appearance of the pharynx and larynx. Patent airway. Salivary glands: Fatty replaced parotid glands with small intra parotid lymph nodes. Remaining major salivary glands are normal. Thyroid: Normal. Lymph nodes: Subcentimeter lymph nodes scattered throughout the neck are likely reactive, in addition to RIGHT level IIa 15 mm short access lymph node, LEFT level IIa 12 mm reniform lymph node, 12 mm short access LEFT level IIb/ 3 lymph node. Vascular: Normal. Limited intracranial:  Normal (multifocal infarcts seen on prior CT not imaged on this neck examination.) Visualized orbits: Normal. Mastoids and visualized paranasal sinuses: Well aerated. Skeleton: Straightened cervical lordosis without destructive bony lesions. Multiple absent teeth. Upper chest: Lung apices are clear. IMPRESSION: Adenoidal hypertrophy can be seen with viral illness, including immunocompromised states. Mild lymphadenopathy throughout the neck is likely reactive. Electronically Signed   By: Elon Alas M.D.   On: 11/09/2015 01:23      Impression / Plan:   Mary [name] is a 49 y.o. y/o female with anemia and heme positive stools. The patient will be set up for an EGD and colonoscopy for tomorrow. This is to look for the source of her heme positive stools and anemia. The patient has been explained the plan and agrees with it. I have discussed risks & benefits which include, but are not limited to, bleeding, infection, perforation & drug reaction.  The patient agrees with this plan & written consent will be obtained.   .    Thank you for involving me in the care of this patient.      LOS: 0 days   Ollen Bowl, MD  11/09/2015, 4:03 PM   Note: This dictation was prepared with Dragon dictation along with smaller phrase technology. Any transcriptional errors that result from this process are unintentional.

## 2015-11-10 ENCOUNTER — Inpatient Hospital Stay: Payer: Medicare Other | Admitting: Anesthesiology

## 2015-11-10 ENCOUNTER — Encounter: Payer: Self-pay | Admitting: Anesthesiology

## 2015-11-10 ENCOUNTER — Encounter
Admission: EM | Disposition: A | Discharge status: Home / Self Care | Payer: Self-pay | Source: Home / Self Care | Attending: Internal Medicine

## 2015-11-10 DIAGNOSIS — D62 Acute posthemorrhagic anemia: Secondary | ICD-10-CM | POA: Insufficient documentation

## 2015-11-10 DIAGNOSIS — D122 Benign neoplasm of ascending colon: Secondary | ICD-10-CM | POA: Insufficient documentation

## 2015-11-10 DIAGNOSIS — R195 Other fecal abnormalities: Secondary | ICD-10-CM | POA: Insufficient documentation

## 2015-11-10 HISTORY — PX: COLONOSCOPY WITH PROPOFOL: SHX5780

## 2015-11-10 HISTORY — PX: ESOPHAGOGASTRODUODENOSCOPY (EGD) WITH PROPOFOL: SHX5813

## 2015-11-10 LAB — BASIC METABOLIC PANEL
ANION GAP: 8 (ref 5–15)
BUN: 7 mg/dL (ref 6–20)
CALCIUM: 8.3 mg/dL — AB (ref 8.9–10.3)
CHLORIDE: 105 mmol/L (ref 101–111)
CO2: 25 mmol/L (ref 22–32)
Creatinine, Ser: 0.77 mg/dL (ref 0.44–1.00)
GFR calc non Af Amer: >60 mL/min (ref >60)
Glucose, Bld: 131 mg/dL — ABNORMAL HIGH (ref 65–99)
POTASSIUM: 3.6 mmol/L (ref 3.5–5.1)
Sodium: 138 mmol/L (ref 135–145)

## 2015-11-10 LAB — CBC
HEMATOCRIT: 27.5 % — AB (ref 35.0–47.0)
Hemoglobin: 8.5 g/dL — ABNORMAL LOW (ref 12.0–16.0)
MCH: 18.2 pg — AB (ref 26.0–34.0)
MCHC: 30.9 g/dL — AB (ref 32.0–36.0)
MCV: 58.9 fL — AB (ref 80.0–100.0)
Platelets: 348 10*3/uL (ref 150–440)
RBC: 4.67 MIL/uL (ref 3.80–5.20)
RDW: 21.4 % — AB (ref 11.5–14.5)
WBC: 14.3 10*3/uL — ABNORMAL HIGH (ref 3.6–11.0)

## 2015-11-10 LAB — GLUCOSE, CAPILLARY
GLUCOSE-CAPILLARY: 110 mg/dL — AB (ref 65–99)
Glucose-Capillary: 136 mg/dL — ABNORMAL HIGH (ref 65–99)
Glucose-Capillary: 123 mg/dL — ABNORMAL HIGH (ref 65–99)
Glucose-Capillary: 104 mg/dL — ABNORMAL HIGH (ref 65–99)

## 2015-11-10 LAB — TYPE AND SCREEN
ABO/RH(D): O POS
ANTIBODY SCREEN: NEGATIVE
Unit division: 0

## 2015-11-10 LAB — PROTIME-INR
INR: 1.95
Prothrombin Time: 22.1 seconds — ABNORMAL HIGH (ref 11.4–15.0)

## 2015-11-10 SURGERY — ESOPHAGOGASTRODUODENOSCOPY (EGD) WITH PROPOFOL
Anesthesia: General

## 2015-11-10 MED ORDER — FERROUS SULFATE 325 (65 FE) MG PO TABS
325.0000 mg | ORAL_TABLET | Freq: Two times a day (BID) | ORAL | Status: DC
  Administered 2015-11-10 – 2015-11-11 (×2): 325 mg via ORAL
  Filled 2015-11-10 (×2): qty 1

## 2015-11-10 MED ORDER — MIDAZOLAM HCL 2 MG/2ML IJ SOLN
INTRAMUSCULAR | Status: DC | PRN
  Administered 2015-11-10: 1 mg via INTRAVENOUS

## 2015-11-10 MED ORDER — PROPOFOL 500 MG/50ML IV EMUL
INTRAVENOUS | Status: DC | PRN
  Administered 2015-11-10: 160 ug/kg/min via INTRAVENOUS

## 2015-11-10 MED ORDER — FENTANYL CITRATE (PF) 100 MCG/2ML IJ SOLN
INTRAMUSCULAR | Status: DC | PRN
  Administered 2015-11-10: 50 ug via INTRAVENOUS

## 2015-11-10 MED ORDER — LIDOCAINE HCL (CARDIAC) 20 MG/ML IV SOLN
INTRAVENOUS | Status: DC | PRN
  Administered 2015-11-10: 60 mg via INTRAVENOUS

## 2015-11-10 MED ORDER — PROPOFOL 10 MG/ML IV BOLUS
INTRAVENOUS | Status: DC | PRN
  Administered 2015-11-10: 140 mg via INTRAVENOUS

## 2015-11-10 NOTE — Clinical Documentation Improvement (Addendum)
[facility] Internal Medicine  (CDI query responses must be documented in the progress notes and discharge summary. Responses documented on the BPA are not considered codeable responses because the BPA is not part of the permanent medical record.)  Possible Clinical Conditions:  - Morbid Obesity, BMI 49.92  - Other condition  - Unable to clinically determine  Clinical Information: BMI 49.92 is documented on the admission orders in [facility] "Severe obesity" is documented in the patient active problem list of the ED provider's documentation.  Please exercise your independent, professional judgment when responding. A specific answer is not anticipated or expected.   Thank You,  Erling Conte  RN BSN CCDS [phone] Health Information Management [place]

## 2015-11-10 NOTE — Op Note (Signed)
[facility] Gastroenterology Patient Name: Mary [name] Procedure Date: 11/10/2015 1:23 PM MRN: [id number] Account #: [id number] Date of Birth: 09/28/1966 Admit Type: Inpatient Age: 49 Room: [facility] ENDO ROOM 3 Gender: Female Note Status: Finalized Procedure:         Upper GI endoscopy Indications:       Acute post hemorrhagic anemia Providers:         Lucilla Lame, MD Referring MD:      No Local Md, MD (Referring MD) Medicines:         Propofol per Anesthesia Complications:     No immediate complications. Procedure:         Pre-Anesthesia Assessment:                    - Prior to the procedure, a History and Physical was                     performed, and patient medications and allergies were                     reviewed. The patient's tolerance of previous anesthesia                     was also reviewed. The risks and benefits of the procedure                     and the sedation options and risks were discussed with the                     patient. All questions were answered, and informed consent                     was obtained. Prior Anticoagulants: The patient has taken                     no previous anticoagulant or antiplatelet agents. ASA                     Grade Assessment: II - A patient with mild systemic                     disease. After reviewing the risks and benefits, the                     patient was deemed in satisfactory condition to undergo                     the procedure.                    After obtaining informed consent, the endoscope was passed                     under direct vision. Throughout the procedure, the                     patient's blood pressure, pulse, and oxygen saturations                     were monitored continuously. The Endoscope was introduced                     through the mouth, and advanced to the second part of  duodenum. The upper GI endoscopy was accomplished without       difficulty. The patient tolerated the procedure well. Findings:      The examined esophagus was normal.      The entire examined stomach was normal.      The examined duodenum was normal. Impression:        - Normal esophagus.                    - Normal stomach.                    - Normal examined duodenum.                    - No specimens collected. Recommendation:    - Perform a colonoscopy today. Procedure Code(s): --- Professional ---                    [phone], Esophagogastroduodenoscopy, flexible, transoral;                     diagnostic, including collection of specimen(s) by                     brushing or washing, when performed (separate procedure) Diagnosis Code(s): --- Professional ---                    D62, Acute posthemorrhagic anemia CPT copyright 2014 American Medical Association. All rights reserved. The codes documented in this report are preliminary and upon coder review may  be revised to meet current compliance requirements. Lucilla Lame, MD 11/10/2015 1:31:52 PM This report has been signed electronically. Number of Addenda: 0 Note Initiated On: 11/10/2015 1:23 PM      [facility]

## 2015-11-10 NOTE — Progress Notes (Signed)
Previous nurse stated he called about golytely when i came on at [place] again around 8:30/9 pm and golytely came up at 10pm and pt started drinking.

## 2015-11-10 NOTE — Progress Notes (Signed)
Pt stable post procedure.  Report called to [place], Therapist, sports.  Transported to room 225.

## 2015-11-10 NOTE — Op Note (Signed)
[facility] Gastroenterology Patient Name: Mary [name] Procedure Date: 11/10/2015 1:22 PM MRN: [id number] Account #: [id number] Date of Birth: [birth year] Admit Type: Inpatient Age: 49 Room: [facility] ENDO ROOM 3 Gender: Female Note Status: Finalized Procedure:         Colonoscopy Indications:       Gastrointestinal occult blood loss Providers:         Lucilla Lame, MD Referring MD:      No Local Md, MD (Referring MD) Medicines:         Propofol per Anesthesia Complications:     No immediate complications. Procedure:         Pre-Anesthesia Assessment:                    - Prior to the procedure, a History and Physical was                     performed, and patient medications and allergies were                     reviewed. The patient's tolerance of previous anesthesia                     was also reviewed. The risks and benefits of the procedure                     and the sedation options and risks were discussed with the                     patient. All questions were answered, and informed consent                     was obtained. Prior Anticoagulants: The patient has taken                     no previous anticoagulant or antiplatelet agents. ASA                     Grade Assessment: III - A patient with severe systemic                     disease. After reviewing the risks and benefits, the                     patient was deemed in satisfactory condition to undergo                     the procedure.                    After obtaining informed consent, the colonoscope was                     passed under direct vision. Throughout the procedure, the                     patient's blood pressure, pulse, and oxygen saturations                     were monitored continuously. The Colonoscope was                     introduced through the anus and advanced to the the cecum,  identified by appendiceal orifice and ileocecal valve. The       colonoscopy was performed without difficulty. The patient                     tolerated the procedure well. The quality of the bowel                     preparation was good. Findings:      The perianal and digital rectal examinations were normal.      A 8 mm polyp was found in the ascending colon. The polyp was       pedunculated. The polyp was removed with a hot snare. Resection and       retrieval were complete. To prevent bleeding after the polypectomy, one       hemostatic clip was successfully placed (MRI compatible). There was no       bleeding at the end of the procedure.      Non-bleeding internal hemorrhoids were found during retroflexion. The       hemorrhoids were Grade II (internal hemorrhoids that prolapse but reduce       spontaneously). Impression:        - One 8 mm polyp in the ascending colon. Resected and                     retrieved. MRI-compatible clip was placed.                    - Non-bleeding internal hemorrhoids. Recommendation:    - Await pathology results.                    - Repeat colonoscopy in 5 years if polyp adenoma and 10                     years if hyperplastic Procedure Code(s): --- Professional ---                    [phone], Colonoscopy, flexible; with removal of tumor(s),                     polyp(s), or other lesion(s) by snare technique Diagnosis Code(s): --- Professional ---                    R19.5, Other fecal abnormalities                    D12.2, Benign neoplasm of ascending colon CPT copyright 2014 American Medical Association. All rights reserved. The codes documented in this report are preliminary and upon coder review may  be revised to meet current compliance requirements. Lucilla Lame, MD 11/10/2015 1:51:09 PM This report has been signed electronically. Number of Addenda: 0 Note Initiated On: 11/10/2015 1:22 PM Scope Withdrawal Time: 0 hours 5 minutes 56 seconds  Total Procedure Duration: 0 hours 8 minutes 56 seconds        [facility]

## 2015-11-10 NOTE — Anesthesia Postprocedure Evaluation (Signed)
Anesthesia Post Note  Patient: Mary [name]  Procedure(s) Performed: Procedure(s) (LRB): ESOPHAGOGASTRODUODENOSCOPY (EGD) WITH PROPOFOL (N/A) COLONOSCOPY WITH PROPOFOL (N/A)  Patient location during evaluation: Endoscopy Anesthesia Type: General Level of consciousness: awake and alert Pain management: pain level controlled Vital Signs Assessment: post-procedure vital signs reviewed and stable Respiratory status: spontaneous breathing, nonlabored ventilation, respiratory function stable and patient connected to nasal cannula oxygen Cardiovascular status: blood pressure returned to baseline and stable Postop Assessment: No signs of nausea or vomiting Anesthetic complications: no    Last Vitals:  Filed Vitals:   11/10/15 1422 11/10/15 1432  BP: 136/86 147/81  Pulse: 86 86  Temp:    Resp: 32 27    Last Pain:  Filed Vitals:   11/10/15 1434  PainSc: 0-No pain                 Precious Haws Piscitello

## 2015-11-10 NOTE — Anesthesia Preprocedure Evaluation (Addendum)
Anesthesia Evaluation  Patient identified by MRN, date of birth, ID band Patient awake    Reviewed: Allergy & Precautions, H&P , NPO status , Patient's Chart, lab work & pertinent test results  History of Anesthesia Complications Negative for: history of anesthetic complications  Airway Mallampati: III  TM Distance: >3 FB Neck ROM: full    Dental  (+) Poor Dentition, Chipped, Missing, Partial Upper   Pulmonary neg pulmonary ROS, neg shortness of breath,    Pulmonary exam normal breath sounds clear to auscultation       Cardiovascular Exercise Tolerance: Good hypertension, (-) angina(-) Past MI and (-) DOE Normal cardiovascular exam Rhythm:regular Rate:Normal     Neuro/Psych  Headaches, PSYCHIATRIC DISORDERS Depression CVA, Residual Symptoms    GI/Hepatic negative GI ROS, Neg liver ROS,   Endo/Other  diabetes, Type 2Morbid obesity  Renal/GU negative Renal ROS  negative genitourinary   Musculoskeletal   Abdominal   Peds  Hematology negative hematology ROS (+)   Anesthesia Other Findings Past Medical History:   Hypertension                                                 Other and unspecified ovarian cysts                          Stroke ([place])                                                 Diabetes mellitus without complication ([place])                Past Surgical History:   OVARIAN CYST REMOVAL                                          TEE WITHOUT CARDIOVERSION                       N/A 01/25/2013       Comment:Procedure: TRANSESOPHAGEAL ECHOCARDIOGRAM               (TEE);  Surgeon: Birdie Riddle, MD;  Location:              [place];  Service: Cardiovascular;                Laterality: N/A;   LOOP RECORDER IMPLANT                           N/A 04/25/2014       Comment:Procedure: LOOP RECORDER IMPLANT;  Surgeon:               Coralyn Mark, MD;  Location: [place] CATH LAB;                Service: Cardiovascular;   Laterality: N/A;  BMI    Body Mass Index   49.92 kg/m 2    Active strep throat infection, on antibiotics, no shortness of breath or other positive upper airway symptoms.  Reproductive/Obstetrics negative OB ROS  Anesthesia Physical Anesthesia Plan  ASA: III  Anesthesia Plan: General   Post-op Pain Management:    Induction:   Airway Management Planned:   Additional Equipment:   Intra-op Plan:   Post-operative Plan:   Informed Consent: I have reviewed the patients History and Physical, chart, labs and discussed the procedure including the risks, benefits and alternatives for the proposed anesthesia with the patient or authorized representative who has indicated his/her understanding and acceptance.   Dental Advisory Given  Plan Discussed with: Anesthesiologist, CRNA and Surgeon  Anesthesia Plan Comments:         Anesthesia Quick Evaluation

## 2015-11-10 NOTE — Transfer of Care (Signed)
Immediate Anesthesia Transfer of Care Note  Patient: Mary [name]  Procedure(s) Performed: Procedure(s): ESOPHAGOGASTRODUODENOSCOPY (EGD) WITH PROPOFOL (N/A) COLONOSCOPY WITH PROPOFOL (N/A)  Patient Location: PACU  Anesthesia Type:General  Level of Consciousness: awake and patient cooperative  Airway & Oxygen Therapy: Patient Spontanous Breathing and Patient connected to face mask oxygen  Post-op Assessment: Report given to RN and Post -op Vital signs reviewed and stable  Post vital signs: Reviewed and stable  Last Vitals:  Filed Vitals:   11/10/15 1247 11/10/15 1402  BP: 134/69 136/89  Pulse: 80 90  Temp: 37.4 C 36 C  Resp: 18 18    Complications: No apparent anesthesia complications

## 2015-11-10 NOTE — Progress Notes (Signed)
[facility] at [facility]                                                                                                                                                                                            Patient Demographics   Mary [name], is a 49 y.o. female, DOB - 01-07-1966, [id number]  Admit date - 11/08/2015   Admitting Physician Lytle Butte, MD  Outpatient Primary MD for the patient is Lorayne Marek, MD   LOS - 1  Subjective: Patient feeling better  sore throat improved, patient awaiting colonoscopy and EGD     Review of Systems:   CONSTITUTIONAL: No documented fever. No fatigue, weakness. No weight gain, no weight loss.  EYES: No blurry or double vision.  ENT: No tinnitus. No postnasal drip. No redness of the oropharynx.  RESPIRATORY: No cough, no wheeze, no hemoptysis. No dyspnea.  CARDIOVASCULAR: No chest pain. No orthopnea. No palpitations. No syncope.  GASTROINTESTINAL: No nausea, no vomiting or diarrhea. No abdominal pain. No melena or hematochezia.  GENITOURINARY: No dysuria or hematuria.  ENDOCRINE: No polyuria or nocturia. No heat or cold intolerance.  HEMATOLOGY: No anemia. No bruising. No bleeding.  INTEGUMENTARY: No rashes. No lesions.  MUSCULOSKELETAL: No arthritis. No swelling. No gout.  NEUROLOGIC: No numbness, tingling, or ataxia. No seizure-type activity.  PSYCHIATRIC: No anxiety. No insomnia. No ADD.    Vitals:   Filed Vitals:   11/09/15 2200 11/10/15 0451 11/10/15 1205 11/10/15 1247  BP: 149/86 126/70 139/74 134/69  Pulse: 102 83 80 80  Temp: 98.5 F (36.9 C) 98 F (36.7 C) 97.9 F (36.6 C) 99.3 F (37.4 C)  TempSrc: Oral Oral Oral Tympanic  Resp: 18 24  18   Height:      Weight:      SpO2: 95% 93% 95% 96%    Wt Readings from Last 3 Encounters:  11/08/15 136.079 kg (300 lb)  09/30/15 136.079 kg (300 lb)  07/22/15 136.079 kg (300 lb)     Intake/Output Summary (Last 24 hours) at  11/10/15 1405 Last data filed at 11/10/15 1352  Gross per 24 hour  Intake   3441 ml  Output    950 ml  Net   2491 ml    Physical Exam:   GENERAL: Pleasant-appearing in no apparent distress.  HEAD, EYES, EARS, NOSE AND THROAT: Atraumatic, normocephalic. Extraocular muscles are intact. Pupils equal and reactive to light. Sclerae anicteric. No conjunctival injection. No oro-pharyngeal erythema.  NECK: Supple. There is no jugular venous distention. No bruits, no lymphadenopathy, no thyromegaly.  HEART: Regular rate and rhythm,. No murmurs, no  rubs, no clicks.  LUNGS: Clear to auscultation bilaterally. No rales or rhonchi. No wheezes.  ABDOMEN: Soft, flat, nontender, nondistended. Has good bowel sounds. No hepatosplenomegaly appreciated.  EXTREMITIES: No evidence of any cyanosis, clubbing, or peripheral edema.  +2 pedal and radial pulses bilaterally.  NEUROLOGIC: The patient is alert, awake, and oriented x3 with no focal motor or sensory deficits appreciated bilaterally.  SKIN: Moist and warm with no rashes appreciated.  Psych: Not anxious, depressed LN: No inguinal LN enlargement    Antibiotics   Anti-infectives    Start     Dose/Rate Route Frequency Ordered Stop   11/09/15 1400  Ampicillin-Sulbactam (UNASYN) 3 g in sodium chloride 0.9 % 100 mL IVPB     3 g 100 mL/hr over 60 Minutes Intravenous Every 6 hours 11/09/15 1338     11/09/15 0600  piperacillin-tazobactam (ZOSYN) IVPB 4.5 g  Status:  Discontinued     4.5 g 25 mL/hr over 240 Minutes Intravenous 3 times per day 11/09/15 0223 11/09/15 1338   11/08/15 2345  vancomycin (VANCOCIN) 1,500 mg in sodium chloride 0.9 % 500 mL IVPB  Status:  Discontinued     1,500 mg 250 mL/hr over 120 Minutes Intravenous  Once 11/08/15 2336 11/09/15 0258   11/08/15 2300  piperacillin-tazobactam (ZOSYN) IVPB 3.375 g     3.375 g 100 mL/hr over 30 Minutes Intravenous  Once 11/08/15 2254 11/09/15 0056   11/08/15 2300  vancomycin (VANCOCIN) IVPB 1000  mg/200 mL premix  Status:  Discontinued     1,000 mg 200 mL/hr over 60 Minutes Intravenous  Once 11/08/15 2254 11/08/15 2336   11/08/15 2130  cephALEXin (KEFLEX) capsule 500 mg     500 mg Oral  Once 11/08/15 2123 11/08/15 2156      Medications   Scheduled Meds: . amLODipine  5 mg Oral Daily  . ampicillin-sulbactam (UNASYN) IV  3 g Intravenous Q6H  . insulin aspart  0-5 Units Subcutaneous QHS  . insulin aspart  0-9 Units Subcutaneous TID WC  . methylPREDNISolone (SOLU-MEDROL) injection  20 mg Intravenous Q12H  . metoprolol tartrate  25 mg Oral BID  . sodium chloride  3 mL Intravenous Q12H   Continuous Infusions: . sodium chloride 1,000 mL (11/10/15 1304)   PRN Meds:.acetaminophen **OR** acetaminophen, morphine injection, ondansetron **OR** ondansetron (ZOFRAN) IV, oxyCODONE   Data Review:   Micro Results Recent Results (from the past 240 hour(s))  Urine culture     Status: None (Preliminary result)   Collection Time: 11/09/15  4:50 PM  Result Value Ref Range Status   Specimen Description URINE, CLEAN CATCH  Final   Special Requests NONE  Final   Culture NO GROWTH < 24 HOURS  Final   Report Status PENDING  Incomplete    Radiology Reports Dg Chest 2 View  11/08/2015  CLINICAL DATA:  Acute onset of sore throat for 2 days, with chills. Initial encounter. EXAM: CHEST  2 VIEW COMPARISON:  Chest radiograph performed 01/23/2013 FINDINGS: The lungs are well-aerated. Minimal bilateral atelectasis is noted. There is no evidence of pleural effusion or pneumothorax. The heart is normal in size; a loop recorder is noted. No acute osseous abnormalities are seen. Clips are noted within the right upper quadrant, reflecting prior cholecystectomy. IMPRESSION: Minimal bilateral atelectasis noted.  Lungs otherwise clear. Electronically Signed   By: Garald Balding M.D.   On: 11/08/2015 23:16   Ct Soft Tissue Neck W Contrast  11/09/2015  CLINICAL DATA:  Sore throat and fever today, treated for  strep throat 3 weeks ago. Globus sensation, assess pharyngitis. History of diabetes, hypertension and stroke. EXAM: CT NECK WITH CONTRAST TECHNIQUE: Multidetector CT imaging of the neck was performed using the standard protocol following the bolus administration of intravenous contrast. CONTRAST:  37mL OMNIPAQUE IOHEXOL 300 MG/ML  SOLN COMPARISON:  None. FINDINGS: Large body habitus results in overall noisy image quality. Pharynx and larynx: Enlarged adenoidal soft tissues. Normal appearance of the pharynx and larynx. Patent airway. Salivary glands: Fatty replaced parotid glands with small intra parotid lymph nodes. Remaining major salivary glands are normal. Thyroid: Normal. Lymph nodes: Subcentimeter lymph nodes scattered throughout the neck are likely reactive, in addition to RIGHT level IIa 15 mm short access lymph node, LEFT level IIa 12 mm reniform lymph node, 12 mm short access LEFT level IIb/ 3 lymph node. Vascular: Normal. Limited intracranial: Normal (multifocal infarcts seen on prior CT not imaged on this neck examination.) Visualized orbits: Normal. Mastoids and visualized paranasal sinuses: Well aerated. Skeleton: Straightened cervical lordosis without destructive bony lesions. Multiple absent teeth. Upper chest: Lung apices are clear. IMPRESSION: Adenoidal hypertrophy can be seen with viral illness, including immunocompromised states. Mild lymphadenopathy throughout the neck is likely reactive. Electronically Signed   By: Elon Alas M.D.   On: 11/09/2015 01:23     CBC  Recent Labs Lab 11/08/15 2256 11/09/15 0453 11/10/15 0359  WBC 13.3* 14.0* 14.3*  HGB 8.0* 7.0* 8.5*  HCT 25.8* 22.7* 27.5*  PLT 384 334 348  MCV 56.5* 56.5* 58.9*  MCH 17.5* 17.5* 18.2*  MCHC 31.0* 30.9* 30.9*  RDW 20.1* 19.7* 21.4*  LYMPHSABS 1.9  --   --   MONOABS 0.5  --   --   EOSABS 0.0  --   --   BASOSABS 0.1  --   --     Chemistries   Recent Labs Lab 11/08/15 2256 11/09/15 0453 11/10/15 0359   NA 139 140 138  K 3.5 3.4* 3.6  CL 108 110 105  CO2 24 25 25   GLUCOSE 128* 106* 131*  BUN 13 11 7   CREATININE 1.07* 0.94 0.77  CALCIUM 8.7* 8.2* 8.3*  AST 25  --   --   ALT 16  --   --   ALKPHOS 57  --   --   BILITOT 0.5  --   --    ------------------------------------------------------------------------------------------------------------------ estimated creatinine clearance is 117.7 mL/min (by C-G formula based on Cr of 0.77). ------------------------------------------------------------------------------------------------------------------ No results for input(s): HGBA1C in the last 72 hours. ------------------------------------------------------------------------------------------------------------------ No results for input(s): CHOL, HDL, LDLCALC, TRIG, CHOLHDL, LDLDIRECT in the last 72 hours. ------------------------------------------------------------------------------------------------------------------ No results for input(s): TSH, T4TOTAL, T3FREE, THYROIDAB in the last 72 hours.  Invalid input(s): FREET3 ------------------------------------------------------------------------------------------------------------------  Recent Labs  11/08/15 2256 11/09/15 0453  VITAMINB12  --  240  FOLATE  --  6.6  FERRITIN  --  6*  TIBC  --  373  IRON  --  12*  RETICCTPCT 1.3  --     Coagulation profile  Recent Labs Lab 11/08/15 2256 11/10/15 0359  INR 2.38 1.95    No results for input(s): DDIMER in the last 72 hours.  Cardiac Enzymes  Recent Labs Lab 11/08/15 2256  TROPONINI <0.03   ------------------------------------------------------------------------------------------------------------------ Invalid input(s): POCBNP    Assessment & Plan   49 year old African-American female history of type 2 diabetes non-insulin-requiring presenting with sore throat, and emergency department meeting septic criteria  1.Sepsis, due to severe pharyngitis, continue Unasyn and  Solu-Medrol can change to oral tomorrow and  discharge home  2. Severe iron deficiency anemia. With guiac positive stools on chronic Coumadin therapy, start patient on iron therapy.   3. Type 2 diabetes non-insulin-requiring: Hold oral agents acd sliding scale coverage 4. Essential hypertension: Continue Lopressor Norvasc 5. Hypercoagulable state with history of stroke, Coumadin on hold, if no GI bleeding source identified then can restart Coumadin    Code Status Orders        Start     Ordered   11/09/15 0003  Full code   Continuous     11/09/15 0002           Consults  GI   DVT Prophylaxis SCDs  Lab Results  Component Value Date   PLT 348 11/10/2015     Time Spent in minutes   30 minutes  Dustin Flock M.D on 11/10/2015 at 2:05 PM  Between 7am to 6pm - Pager - [phone]  After 6pm go to [email or web address] - password [email or web address] [facility]   Office  [phone]

## 2015-11-11 ENCOUNTER — Encounter: Payer: Self-pay | Admitting: Gastroenterology

## 2015-11-11 LAB — CBC
HEMATOCRIT: 27.2 % — AB (ref 35.0–47.0)
Hemoglobin: 8.5 g/dL — ABNORMAL LOW (ref 12.0–16.0)
MCH: 18.4 pg — ABNORMAL LOW (ref 26.0–34.0)
MCHC: 31.2 g/dL — ABNORMAL LOW (ref 32.0–36.0)
MCV: 59.1 fL — ABNORMAL LOW (ref 80.0–100.0)
PLATELETS: 365 10*3/uL (ref 150–440)
RBC: 4.6 MIL/uL (ref 3.80–5.20)
RDW: 21 % — AB (ref 11.5–14.5)
WBC: 12 10*3/uL — AB (ref 3.6–11.0)

## 2015-11-11 LAB — URINE CULTURE: CULTURE: NO GROWTH

## 2015-11-11 LAB — BASIC METABOLIC PANEL
ANION GAP: 7 (ref 5–15)
BUN: 9 mg/dL (ref 6–20)
CALCIUM: 8.4 mg/dL — AB (ref 8.9–10.3)
CO2: 24 mmol/L (ref 22–32)
CREATININE: 0.7 mg/dL (ref 0.44–1.00)
Chloride: 107 mmol/L (ref 101–111)
Glucose, Bld: 134 mg/dL — ABNORMAL HIGH (ref 65–99)
Potassium: 3.5 mmol/L (ref 3.5–5.1)
SODIUM: 138 mmol/L (ref 135–145)

## 2015-11-11 LAB — PROTIME-INR
INR: 1.77
PROTHROMBIN TIME: 20.6 s — AB (ref 11.4–15.0)

## 2015-11-11 LAB — GLUCOSE, CAPILLARY: GLUCOSE-CAPILLARY: 119 mg/dL — AB (ref 65–99)

## 2015-11-11 LAB — POCT RAPID STREP A: STREPTOCOCCUS, GROUP A SCREEN (DIRECT): POSITIVE — AB

## 2015-11-11 MED ORDER — AMOXICILLIN 500 MG PO CAPS: 500.0000 mg | ORAL_CAPSULE | Freq: Three times a day (TID) | ORAL | Status: DC

## 2015-11-11 MED ORDER — FERROUS SULFATE 325 (65 FE) MG PO TABS: 325.0000 mg | ORAL_TABLET | Freq: Two times a day (BID) | ORAL | Status: DC

## 2015-11-11 MED ORDER — PREDNISONE 10 MG (21) PO TBPK: 10.0000 mg | ORAL_TABLET | Freq: Every day | ORAL | Status: DC

## 2015-11-11 NOTE — Discharge Summary (Signed)
Mary [name], is a 48 y.o. female  DOB 1966-12-19  MRN [id number].  Admission date:  11/08/2015  Admitting Physician  Lytle Butte, MD  Discharge Date:  11/11/2015   Primary MD  Lorayne Marek, MD  Recommendations for primary care physician for things to follow  follow-up with the primary doctor in one week Follow up with gastroenterology in 1 month.  Admission Diagnosis  Pharyngitis [J02.9] Stool guaiac positive [R19.5] Sepsis ([place]) [A41.9] Sepsis, due to unspecified organism ([place]) [A41.9] Anemia, unspecified anemia type [D64.9]   Discharge Diagnosis  Pharyngitis [J02.9] Stool guaiac positive [R19.5] Sepsis ([place]) [A41.9] Sepsis, due to unspecified organism ([place]) [A41.9] Anemia, unspecified anemia type [D64.9]    Active Problems:   Sepsis ([place])   Fecal occult blood test positive   Absolute anemia   Acute posthemorrhagic anemia   OB + stool   Benign neoplasm of ascending colon      Past Medical History  Diagnosis Date  . Hypertension   . Other and unspecified ovarian cysts   . Stroke ([place])   . Diabetes mellitus without complication [facility])     Past Surgical History  Procedure Laterality Date  . Ovarian cyst removal    . Tee without cardioversion N/A 01/25/2013    Procedure: TRANSESOPHAGEAL ECHOCARDIOGRAM (TEE);  Surgeon: Birdie Riddle, MD;  Location: [place];  Service: Cardiovascular;  Laterality: N/A;  . Loop recorder implant N/A 04/25/2014    Procedure: LOOP RECORDER IMPLANT;  Surgeon: Coralyn Mark, MD;  Location: [facility] CATH LAB;  Service: Cardiovascular;  Laterality: N/A;  . Esophagogastroduodenoscopy (egd) with propofol N/A 11/10/2015    Procedure: ESOPHAGOGASTRODUODENOSCOPY (EGD) WITH PROPOFOL;  Surgeon: Lucilla Lame, MD;  Location: [facility] ENDOSCOPY;  Service: Endoscopy;  Laterality: N/A;  .  Colonoscopy with propofol N/A 11/10/2015    Procedure: COLONOSCOPY WITH PROPOFOL;  Surgeon: Lucilla Lame, MD;  Location: [facility] ENDOSCOPY;  Service: Endoscopy;  Laterality: N/A;       History of present illness and  Hospital Course:     Kindly see H&P for history of present illness and admission details, please review complete Labs, Consult reports and Test reports for all details in brief  HPI  from the history and physical done on the day of admission 49 year old female patient with history of type 2 diabetes mellitus, hypertension, history of strokes on Coumadin comes in with sore throat, and difficulty swallowing. She had previous history of strep throat in October.   Hospital Course  #1 acute pharyngitis: Asian had sepsis presentation on admission with tachycardia  and leukocytosis and temperature: Patient started on IV hydration, Unasyn, initially received vanco  and Zosyn but the antibiotics and change her to Unasyn. She  also received IV steroids. Patient CAT scan of the neck showed enlarged adenoids, patient feels better, decreased swelling and pain resolved. Tolerating the diet. Discharging home with amoxicillin, prednisone.  #2 anemia of blood loss: Patient had severe anemia with guaiac-positive stools. Coumadin was held. Patient hemoglobin was 10 on presentation dropped to 7: Patient received 1 unit And RBC. Consultation with gastroenterology obtained. Patient started on Protonix IV. Patient was taken to EGD and colonoscopy on 22nd of November. Patient initially was unremarkable with normal esophagus and normal stomach. Colonoscopy showed 8 mm peduncular polyp in ascending colon which was snared and also most at a clip was placed. Patient also has nonbleeding internal hemorrhoids present pathology report is pending. If pathology shows adenoma  Needs  colonoscopy in 5 years if polyp is hyperplastic needs colonoscopy in 10 years. . #3 .iron deficiency anemia 'iron level is 12., iron binding  capacity is 373: Discharge home with iron  Tablets #4 history of strokes; and hypercoagulable state. patient is on Coumadin at home. I spoke with Dr. Vira Agar was covering Dr. Allen Norris he suggested to restart the Coumadin as polyps was removed and hemostasis clip was placed. So I restarted the Coumadin.  #5 diabetes mellitus type 2 patient can continue metformin. #6 history of hypertension; patient can take her Lopressor and Norvasc.  Discharge Condition: stable   Follow UP  Follow-up Information    Follow up with Ollen Bowl, MD In 1 month.   Specialty:  Gastroenterology   Contact information:   [address] [place] [address] [phone]         Discharge Instructions  and  Discharge Medications        Medication List    TAKE these medications        amLODipine 5 MG tablet  Commonly known as:  NORVASC  Take 5 mg by mouth daily.     amoxicillin 500 MG capsule  Commonly known as:  AMOXIL  Take 1 capsule (500 mg total) by mouth 3 (three) times daily.     ferrous sulfate 325 (65 FE) MG tablet  Take 1 tablet (325 mg total) by mouth 2 (two) times daily with a meal.     metFORMIN 500 MG tablet  Commonly known as:  GLUCOPHAGE  Take 1 tablet (500 mg total) by mouth 2 (two) times daily with a meal.     metoprolol tartrate 25 MG tablet  Commonly known as:  LOPRESSOR  Take 1 tablet (25 mg total) by mouth 2 (two) times daily.     ondansetron 4 MG tablet  Commonly known as:  ZOFRAN  Take 1 tablet (4 mg total) by mouth daily as needed for nausea or vomiting.     predniSONE 10 MG (21) Tbpk tablet  Commonly known as:  STERAPRED UNI-PAK 21 TAB  Take 1 tablet (10 mg total) by mouth daily. Three tab daily for 3 days 2 tab daily for 3 days 1 tab daily for 2 days     tacrolimus 0.1 % ointment  Commonly known as:  PROTOPIC  Apply 1 application topically 2 (two) times daily as needed (dermatitis).     triamcinolone cream 0.1 %  Commonly known as:  KENALOG  Apply 1  application topically as needed (itching).     warfarin 5 MG tablet  Commonly known as:  COUMADIN  Take as directed by Coumadin Clinic          Diet and Activity recommendation: See Discharge Instructions above   Consults obtained - GI   Major procedures and Radiology Reports - PLEASE review detailed and final reports for all details, in brief -      Dg Chest 2 View  11/08/2015  CLINICAL DATA:  Acute onset of sore throat for 2 days, with chills. Initial encounter. EXAM: CHEST  2 VIEW COMPARISON:  Chest radiograph performed 01/23/2013 FINDINGS: The lungs are well-aerated. Minimal bilateral atelectasis is noted. There is no evidence of pleural effusion or pneumothorax. The heart is normal in size; a loop recorder is noted. No acute osseous abnormalities are seen. Clips are noted within the right upper quadrant, reflecting prior cholecystectomy. IMPRESSION: Minimal bilateral atelectasis noted.  Lungs otherwise clear. Electronically Signed   By: Garald Balding M.D.   On: 11/08/2015 23:16   Ct Soft Tissue Neck W Contrast  11/09/2015  CLINICAL DATA:  Sore throat and fever today, treated for strep throat 3 weeks ago. Globus sensation, assess pharyngitis. History of diabetes, hypertension and stroke. EXAM: CT NECK WITH CONTRAST TECHNIQUE: Multidetector CT imaging of the neck was performed using the standard protocol following the bolus administration of intravenous contrast. CONTRAST:  68mL OMNIPAQUE IOHEXOL 300 MG/ML  SOLN COMPARISON:  None. FINDINGS: Large body habitus results in overall noisy image quality. Pharynx and larynx: Enlarged adenoidal soft tissues. Normal appearance of the pharynx and larynx. Patent airway. Salivary glands: Fatty replaced parotid glands with small intra parotid lymph nodes. Remaining major salivary glands are normal. Thyroid: Normal. Lymph nodes: Subcentimeter lymph nodes scattered throughout the neck are likely reactive, in addition to RIGHT level IIa 15 mm short  access lymph node, LEFT level IIa 12 mm reniform lymph node, 12 mm short access LEFT level IIb/ 3 lymph node. Vascular: Normal. Limited intracranial: Normal (multifocal infarcts seen on prior CT not imaged on this neck examination.) Visualized orbits: Normal. Mastoids and visualized paranasal sinuses: Well aerated. Skeleton: Straightened cervical lordosis without destructive bony lesions. Multiple absent teeth. Upper chest: Lung apices are clear. IMPRESSION: Adenoidal hypertrophy can be seen with viral illness, including immunocompromised states. Mild lymphadenopathy throughout the neck is likely reactive. Electronically Signed   By: Elon Alas M.D.   On: 11/09/2015 01:23    Micro Results    Recent Results (from the past 240 hour(s))  Blood Culture (routine x 2)     Status: None (Preliminary result)   Collection Time: 11/08/15 10:56 PM  Result Value Ref Range Status   Specimen Description BLOOD RIGHT ASSIST CONTROL  Final   Special Requests BOTTLES DRAWN AEROBIC AND ANAEROBIC 4CC  Final   Culture NO GROWTH 2 DAYS  Final   Report Status PENDING  Incomplete  Blood Culture (routine x 2)     Status: None (Preliminary result)   Collection Time: 11/08/15 11:50 PM  Result Value Ref Range Status   Specimen Description BLOOD LEFT FACE  Final   Special Requests BOTTLES DRAWN AEROBIC AND ANAEROBIC 4CC  Final   Culture NO GROWTH 3 DAYS  Final   Report Status PENDING  Incomplete  Urine culture     Status: None   Collection Time: 11/09/15  4:50 PM  Result Value Ref Range Status   Specimen Description URINE, CLEAN CATCH  Final   Special Requests NONE  Final   Culture NO GROWTH 2 DAYS  Final   Report Status 11/11/2015 FINAL  Final  Today   Subjective:   Mary [name] today has no headache,no chest abdominal pain,no new weakness tingling or numbness, feels much better wants to go home today.   Objective:   Blood pressure 157/90, pulse 78, temperature 97.9 F (36.6 C), temperature  source Oral, resp. rate 18, height 5\' 5"  (1.651 m), weight 136.079 kg (300 lb), last menstrual period 09/21/2015, SpO2 92 %.   Intake/Output Summary (Last 24 hours) at 11/11/15 1004 Last data filed at 11/11/15 0736  Gross per 24 hour  Intake 1618.95 ml  Output    600 ml  Net 1018.95 ml    Exam Awake Alert, Oriented x 3, No new F.N deficits, Normal affect [place].AT,PERRAL Supple Neck,No JVD, No cervical lymphadenopathy appriciated.  Symmetrical Chest wall movement, Good air movement bilaterally, CTAB RRR,No Gallops,Rubs or new Murmurs, No Parasternal Heave +ve B.Sounds, Abd Soft, Non tender, No organomegaly appriciated, No rebound -guarding or rigidity. No Cyanosis, Clubbing or edema, No new Rash or bruise  Data Review   CBC w Diff: Lab Results  Component Value Date   WBC 12.0* 11/11/2015   WBC 5.3 04/17/2015   WBC 5.2 10/03/2014   HGB 8.5* 11/11/2015   HGB 10.0* 04/17/2015   HGB 11.2* 10/03/2014   HCT 27.2* 11/11/2015   HCT 32.1* 04/17/2015   HCT 36.5 10/03/2014   PLT 365 11/11/2015   PLT 274 04/17/2015   PLT 264 10/03/2014   LYMPHOPCT 14 11/08/2015   LYMPHOPCT 33.5 04/17/2015   LYMPHOPCT 37.9 10/03/2014   MONOPCT 3 11/08/2015   MONOPCT 6.3 04/17/2015   MONOPCT 6.2 10/03/2014   EOSPCT 0 11/08/2015   EOSPCT 6.2 04/17/2015   EOSPCT 2.5 10/03/2014   BASOPCT 1 11/08/2015   BASOPCT 1.7 04/17/2015   BASOPCT 1.3 10/03/2014    CMP: Lab Results  Component Value Date   NA 138 11/11/2015   NA 140 10/03/2014   K 3.5 11/11/2015   K 3.6 10/03/2014   CL 107 11/11/2015   CL 106 10/03/2014   CO2 24 11/11/2015   CO2 29 10/03/2014   BUN 9 11/11/2015   BUN 9 10/03/2014   CREATININE 0.70 11/11/2015   CREATININE 0.82 07/22/2015   CREATININE 0.81 10/03/2014   PROT 8.7* 11/08/2015   PROT 7.9 10/07/2014   PROT 8.3* 10/03/2014   ALBUMIN 3.5 11/08/2015   ALBUMIN 3.9 10/07/2014   ALBUMIN 3.1* 10/03/2014   BILITOT 0.5 11/08/2015   BILITOT 0.3 10/03/2014   ALKPHOS 57 11/08/2015    ALKPHOS 76 10/03/2014   AST 25 11/08/2015   AST 28 10/03/2014   ALT 16 11/08/2015   ALT 25 10/03/2014  .   Total Time in preparing paper work, data evaluation and todays exam - 58 minutes  Cliffton Spradley M.D on 11/11/2015 at 10:04 AM    Note: This dictation was prepared with Dragon dictation along with smaller phrase technology. Any transcriptional errors that result from this process are unintentional.

## 2015-11-11 NOTE — Care Management Important Message (Signed)
Important Message  Patient Details  Name: Mary [name] MRN: [id number] Date of Birth: 1966-11-21   Medicare Important Message Given:  Yes    Juliann Pulse A Osman Calzadilla 11/11/2015, 10:02 AM

## 2015-11-13 LAB — CUP PACEART REMOTE DEVICE CHECK: MDC IDC SESS DTM: 20161031230702

## 2015-11-13 LAB — CULTURE, BLOOD (ROUTINE X 2): CULTURE: NO GROWTH

## 2015-11-13 NOTE — Progress Notes (Signed)
Carelink summary report received. Battery status OK. Normal device function. No new symptom episodes, tachy episodes, brady, or pause episodes. 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 Monthly summary reports and ROV with JA PRN.

## 2015-11-14 LAB — CULTURE, BLOOD (ROUTINE X 2): CULTURE: NO GROWTH

## 2015-11-15 LAB — SURGICAL PATHOLOGY

## 2015-11-18 ENCOUNTER — Ambulatory Visit (INDEPENDENT_AMBULATORY_CARE_PROVIDER_SITE_OTHER): Payer: Medicare Other | Admitting: *Deleted

## 2015-11-18 ENCOUNTER — Encounter: Payer: Self-pay | Admitting: Gastroenterology

## 2015-11-18 DIAGNOSIS — I639 Cerebral infarction, unspecified: Secondary | ICD-10-CM | POA: Diagnosis not present

## 2015-11-18 DIAGNOSIS — Z5181 Encounter for therapeutic drug level monitoring: Secondary | ICD-10-CM | POA: Diagnosis not present

## 2015-11-18 LAB — POCT INR: INR: 2.1

## 2015-11-19 NOTE — Progress Notes (Signed)
Carelink Summary Report / Loop Recorder 

## 2015-11-22 ENCOUNTER — Encounter: Payer: Self-pay | Admitting: Internal Medicine

## 2015-11-24 DIAGNOSIS — Z0271 Encounter for disability determination: Secondary | ICD-10-CM

## 2015-12-02 ENCOUNTER — Ambulatory Visit (INDEPENDENT_AMBULATORY_CARE_PROVIDER_SITE_OTHER): Payer: Medicare Other

## 2015-12-02 DIAGNOSIS — I639 Cerebral infarction, unspecified: Secondary | ICD-10-CM | POA: Diagnosis not present

## 2015-12-02 DIAGNOSIS — Z5181 Encounter for therapeutic drug level monitoring: Secondary | ICD-10-CM

## 2015-12-02 LAB — POCT INR: INR: 3.3

## 2015-12-09 NOTE — Telephone Encounter (Signed)
error 

## 2015-12-12 ENCOUNTER — Emergency Department
Admission: EM | Admit: 2015-12-12 | Discharge: 2015-12-12 | Disposition: A | Discharge status: Home / Self Care | Payer: Medicare Other | Attending: Emergency Medicine | Admitting: Emergency Medicine

## 2015-12-12 ENCOUNTER — Encounter: Payer: Self-pay | Admitting: Emergency Medicine

## 2015-12-12 DIAGNOSIS — Z79899 Other long term (current) drug therapy: Secondary | ICD-10-CM | POA: Diagnosis not present

## 2015-12-12 DIAGNOSIS — J039 Acute tonsillitis, unspecified: Secondary | ICD-10-CM | POA: Insufficient documentation

## 2015-12-12 DIAGNOSIS — Z7901 Long term (current) use of anticoagulants: Secondary | ICD-10-CM | POA: Insufficient documentation

## 2015-12-12 DIAGNOSIS — Z7984 Long term (current) use of oral hypoglycemic drugs: Secondary | ICD-10-CM | POA: Diagnosis not present

## 2015-12-12 DIAGNOSIS — R0981 Nasal congestion: Secondary | ICD-10-CM | POA: Diagnosis present

## 2015-12-12 DIAGNOSIS — Z792 Long term (current) use of antibiotics: Secondary | ICD-10-CM | POA: Diagnosis not present

## 2015-12-12 DIAGNOSIS — E119 Type 2 diabetes mellitus without complications: Secondary | ICD-10-CM | POA: Diagnosis not present

## 2015-12-12 DIAGNOSIS — I10 Essential (primary) hypertension: Secondary | ICD-10-CM | POA: Insufficient documentation

## 2015-12-12 HISTORY — DX: Encounter for other specified aftercare: Z51.89

## 2015-12-12 LAB — CBC WITH DIFFERENTIAL/PLATELET
BASOS PCT: 0 %
Basophils Absolute: 0 10*3/uL (ref 0–0.1)
Eosinophils Absolute: 0 10*3/uL (ref 0–0.7)
Eosinophils Relative: 0 %
HEMATOCRIT: 28 % — AB (ref 35.0–47.0)
Hemoglobin: 8.7 g/dL — ABNORMAL LOW (ref 12.0–16.0)
Lymphocytes Relative: 16 %
Lymphs Abs: 2 10*3/uL (ref 1.0–3.6)
MCH: 17.7 pg — ABNORMAL LOW (ref 26.0–34.0)
MCHC: 31 g/dL — AB (ref 32.0–36.0)
MCV: 57.1 fL — ABNORMAL LOW (ref 80.0–100.0)
MONOS PCT: 5 %
Monocytes Absolute: 0.7 10*3/uL (ref 0.2–0.9)
NEUTROS ABS: 9.6 10*3/uL — AB (ref 1.4–6.5)
NEUTROS PCT: 79 %
Platelets: 407 10*3/uL (ref 150–440)
RBC: 4.91 MIL/uL (ref 3.80–5.20)
RDW: 22.2 % — AB (ref 11.5–14.5)
WBC: 12.3 10*3/uL — AB (ref 3.6–11.0)

## 2015-12-12 LAB — POCT RAPID STREP A: STREPTOCOCCUS, GROUP A SCREEN (DIRECT): NEGATIVE

## 2015-12-12 LAB — MONONUCLEOSIS SCREEN: Mono Screen: NEGATIVE

## 2015-12-12 MED ORDER — ACETAMINOPHEN 500 MG PO TABS
1000.0000 mg | ORAL_TABLET | Freq: Once | ORAL | Status: AC
  Administered 2015-12-12: 1000 mg via ORAL
  Filled 2015-12-12: qty 2

## 2015-12-12 MED ORDER — AZITHROMYCIN 250 MG PO TABS: ORAL_TABLET | ORAL | Status: DC

## 2015-12-12 MED ORDER — AZITHROMYCIN 250 MG PO TABS
500.0000 mg | ORAL_TABLET | Freq: Once | ORAL | Status: AC
  Administered 2015-12-12: 500 mg via ORAL
  Filled 2015-12-12: qty 2

## 2015-12-12 NOTE — Discharge Instructions (Signed)
Tonsillitis Tonsillitis is an infection of the throat. This infection causes the tonsils to become red, tender, and puffy (swollen). Tonsils are groups of tissue at the back of your throat. If bacteria caused your infection, antibiotic medicine will be given to you. Sometimes symptoms of tonsillitis can be relieved with the use of steroid medicine. If your tonsillitis is severe and happens often, you may need to get your tonsils removed (tonsillectomy). HOME CARE   Rest and sleep often.  Drink enough fluids to keep your pee (urine) clear or pale yellow.  While your throat is sore, eat soft or liquid foods like:  Soup.  Ice cream.  Instant breakfast drinks.  Eat frozen ice pops.  Gargle with a warm or cold liquid to help soothe the throat. Gargle with a water and salt mix. Mix 1/4 teaspoon of salt and 1/4 teaspoon of baking soda in 1 cup of water.  Only take medicines as told by your doctor.  If you are given medicines (antibiotics), take them as told. Finish them even if you start to feel better. GET HELP IF:  You have large, tender lumps in your neck.  You have a rash.  You cough up green, yellow-brown, or bloody fluid.  You cannot swallow liquids or food for 24 hours.  You notice that only one of your tonsils is swollen. GET HELP RIGHT AWAY IF:   You throw up (vomit).  You have a very bad headache.  You have a stiff neck.  You have chest pain.  You have trouble breathing or swallowing.  You have bad throat pain, drooling, or your voice changes.  You have bad pain not helped by medicine.  You cannot fully open your mouth.  You have redness, puffiness, or bad pain in the neck.  You have a fever. MAKE SURE YOU:   Understand these instructions.  Will watch your condition.  Will get help right away if you are not doing well or get worse.   This information is not intended to replace advice given to you by your health care provider. Make sure you discuss any  questions you have with your health care provider.   Document Released: 05/23/2008 Document Revised: 12/10/2013 Document Reviewed: 05/24/2013 Elsevier Interactive Patient Education 2016 [place] will need to follow-up with [facility] if any continued problems. Call Monday to make an appointment Continue Zithromax starting tomorrow 1 tablet per day until finished

## 2015-12-12 NOTE — ED Notes (Signed)
Sinus congestion and feeling cold. Onset today.  States recently had strep throat, treated with antibiotics.

## 2015-12-12 NOTE — ED Notes (Signed)
Provider made aware of fever. Tylenol given.

## 2015-12-12 NOTE — ED Notes (Signed)
Pt verbalized understanding of d/c instructions, follow up and home care of fever./ Pt denies having further questions. Pt given new paper pants and underwear due to having an accident and then was able to ambulate independently out of ED. No acute distress noted.

## 2015-12-12 NOTE — ED Provider Notes (Signed)
[facility] Emergency Department Provider Note  ____________________________________________  Time seen: Approximately 5:05 PM  I have reviewed the triage vital signs and the nursing notes.   HISTORY  Chief Complaint Nasal Congestion and Chills  HPI Mary [name] is a 49 y.o. female is here with complaint of sinus congestion and throat pain. Patient states that she was recently treated 2 weeks ago for "strep throat". She states that she took the entire 10 day course of antibiotics but is unsure what antibiotic she was placed on. She states that yesterday she began feeling "cold". Today when she was out eating with family she again felt cold and went home and got in the bed. She is continued to feel worse today and increased throat pain. She is unaware of any fever.   Past Medical History  Diagnosis Date  . Hypertension   . Other and unspecified ovarian cysts   . Stroke ([place])   . Diabetes mellitus without complication ([place])   . Blood transfusion without reported diagnosis     transfusion December 2016    Patient Active Problem List   Diagnosis Date Noted  . Acute posthemorrhagic anemia   . OB + stool   . Benign neoplasm of ascending colon   . Sepsis ([place]) 11/09/2015  . Fecal occult blood test positive 11/09/2015  . Absolute anemia   . Cerebral infarction due to embolism of left middle cerebral artery ([place]) 07/16/2015  . Antiphospholipid syndrome ([place]) 11/17/2014  . Cryptogenic stroke ([place]) 10/08/2014  . Essential hypertension 10/08/2014  . Obesity 10/08/2014  . Unspecified vitamin D deficiency 09/08/2014  . Anemia, unspecified 09/08/2014  . Severe obesity (BMI >= 40) ([place]) 08/21/2013  . Acquired dyslexia 07/12/2013  . Aphasia due to stroke 07/12/2013  . Headache 07/12/2013  . Depression due to stroke ([place]) 07/12/2013  . CVA (cerebral infarction) 01/24/2013  . HTN (hypertension) 01/24/2013    Past Surgical History  Procedure Laterality Date   . Ovarian cyst removal    . Tee without cardioversion N/A 01/25/2013    Procedure: TRANSESOPHAGEAL ECHOCARDIOGRAM (TEE);  Surgeon: Birdie Riddle, MD;  Location: [place];  Service: Cardiovascular;  Laterality: N/A;  . Loop recorder implant N/A 04/25/2014    Procedure: LOOP RECORDER IMPLANT;  Surgeon: Coralyn Mark, MD;  Location: [facility] CATH LAB;  Service: Cardiovascular;  Laterality: N/A;  . Esophagogastroduodenoscopy (egd) with propofol N/A 11/10/2015    Procedure: ESOPHAGOGASTRODUODENOSCOPY (EGD) WITH PROPOFOL;  Surgeon: Lucilla Lame, MD;  Location: [facility] ENDOSCOPY;  Service: Endoscopy;  Laterality: N/A;  . Colonoscopy with propofol N/A 11/10/2015    Procedure: COLONOSCOPY WITH PROPOFOL;  Surgeon: Lucilla Lame, MD;  Location: [facility] ENDOSCOPY;  Service: Endoscopy;  Laterality: N/A;    Current Outpatient Rx  Name  Route  Sig  Dispense  Refill  . amLODipine (NORVASC) 5 MG tablet   Oral   Take 5 mg by mouth daily.         Marland Kitchen amoxicillin (AMOXIL) 500 MG capsule   Oral   Take 1 capsule (500 mg total) by mouth 3 (three) times daily.   30 capsule   0   . azithromycin (ZITHROMAX) 250 MG tablet      Take 1 tablet a day for 4 days beginning tomorrow   6 each   0   . ferrous sulfate 325 (65 FE) MG tablet   Oral   Take 1 tablet (325 mg total) by mouth 2 (two) times daily with a meal.   60 tablet  0   . metFORMIN (GLUCOPHAGE) 500 MG tablet   Oral   Take 1 tablet (500 mg total) by mouth 2 (two) times daily with a meal.   180 tablet   3   . metoprolol tartrate (LOPRESSOR) 25 MG tablet   Oral   Take 1 tablet (25 mg total) by mouth 2 (two) times daily.   180 tablet   3   . ondansetron (ZOFRAN) 4 MG tablet   Oral   Take 1 tablet (4 mg total) by mouth daily as needed for nausea or vomiting. Patient not taking: Reported on 11/09/2015   10 tablet   0   . predniSONE (STERAPRED UNI-PAK 21 TAB) 10 MG (21) TBPK tablet   Oral   Take 1 tablet (10 mg total) by mouth daily. Three tab daily  for 3 days 2 tab daily for 3 days 1 tab daily for 2 days   17 tablet   0   . tacrolimus (PROTOPIC) 0.1 % ointment   Topical   Apply 1 application topically 2 (two) times daily as needed (dermatitis).          . triamcinolone cream (KENALOG) 0.1 %   Topical   Apply 1 application topically as needed (itching).          . warfarin (COUMADIN) 5 MG tablet      Take as directed by [facility] Patient taking differently: Take 5 mg by mouth at bedtime. Take as directed by [facility]   90 tablet   1     90 day supply     Allergies Review of patient's allergies indicates no known allergies.  Family History  Problem Relation Age of Onset  . Diabetes Mellitus II Mother   . Diabetes Mother   . Stroke Other   . Diabetes Other   . Transient ischemic attack Father   . Diabetes Sister     Social History Social History  Substance Use Topics  . Smoking status: Never Smoker   . Smokeless tobacco: Never Used  . Alcohol Use: No    Review of Systems Constitutional: No fever/positive chills Eyes: No visual changes. ENT: Positive sore throat. Positive nasal congestion Cardiovascular: Denies chest pain. Respiratory: Denies shortness of breath. Gastrointestinal:   No nausea, no vomiting.   Genitourinary: Negative for dysuria. Musculoskeletal: Negative for back pain. Skin: Negative for rash. Neurological: Negative for headaches, focal weakness or numbness.  10-point ROS otherwise negative.  ____________________________________________   PHYSICAL EXAM:  VITAL SIGNS: ED Triage Vitals  Enc Vitals Group     BP 12/12/15 1641 151/77 mmHg     Pulse Rate 12/12/15 1641 101     Resp 12/12/15 1641 18     Temp 12/12/15 1641 98.9 F (37.2 C)     Temp Source 12/12/15 1641 Oral     SpO2 12/12/15 1641 98 %     Weight 12/12/15 1641 300 lb (136.079 kg)     Height 12/12/15 1641 5\' 5"  (1.651 m)     Head Cir --      Peak Flow --      Pain Score 12/12/15 1643 0     Pain Loc  --      Pain Edu? --      Excl. in [place]? --     Constitutional: Alert and oriented. Well appearing and in no acute distress. Eyes: Conjunctivae are normal. PERRL. EOMI. Head: Atraumatic. Nose: No congestion/rhinnorhea. Mouth/Throat: Mucous membranes are moist.  Oropharynx mild erythema edema with  right tonsillar exudate present. No abscess formation. Neck: No stridor.  Supple Hematological/Lymphatic/Immunilogical: Mild bilateral cervical lymphadenopathy. Cardiovascular: Normal rate, regular rhythm. Grossly normal heart sounds.  Good peripheral circulation. Respiratory: Normal respiratory effort.  No retractions. Lungs CTAB. Gastrointestinal: Soft and nontender. No distention.  Musculoskeletal: Moves upper and lower extremities without any difficulty. Neurologic:  Normal speech and language. No gross focal neurologic deficits are appreciated. No gait instability. Skin:  Skin is warm, dry and intact. No rash noted. Psychiatric: Mood and affect are normal. Speech and behavior are normal.  ____________________________________________   LABS (all labs ordered are listed, but only abnormal results are displayed)  Labs Reviewed  CBC WITH DIFFERENTIAL/PLATELET - Abnormal; Notable for the following:    WBC 12.3 (*)    Hemoglobin 8.7 (*)    HCT 28.0 (*)    MCV 57.1 (*)    MCH 17.7 (*)    MCHC 31.0 (*)    RDW 22.2 (*)    Neutro Abs 9.6 (*)    All other components within normal limits  CULTURE, GROUP A STREP ([facility] ONLY)  MONONUCLEOSIS SCREEN  POCT RAPID STREP A    PROCEDURES  Procedure(s) performed: None  Critical Care performed: No  ____________________________________________   INITIAL IMPRESSION / ASSESSMENT AND PLAN / ED COURSE  Pertinent labs & imaging results that were available during my care of the patient were reviewed by me and considered in my medical decision making (see chart for details).  Patient had a temperature of 101.3 prior to discharge and was given  Tylenol 1 g by mouth. Patient was also started on Zithromax this evening. She is to follow-up with  ENT if any continued problems. She'll also continue taking Tylenol or Motrin as needed for fever and throat pain. ____________________________________________   FINAL CLINICAL IMPRESSION(S) / ED DIAGNOSES  Final diagnoses:  Acute tonsillitis, unspecified etiology      Johnn Hai, PA-C 12/12/15 2051  Earleen Newport, MD 12/12/15 2110

## 2015-12-15 ENCOUNTER — Ambulatory Visit: Payer: Medicare Other | Admitting: Gastroenterology

## 2015-12-15 LAB — CULTURE, GROUP A STREP (THRC)

## 2015-12-16 ENCOUNTER — Ambulatory Visit: Payer: Medicare Other | Admitting: Gastroenterology

## 2015-12-17 ENCOUNTER — Ambulatory Visit (INDEPENDENT_AMBULATORY_CARE_PROVIDER_SITE_OTHER): Payer: Medicare Other | Admitting: Gastroenterology

## 2015-12-17 ENCOUNTER — Other Ambulatory Visit: Payer: Self-pay | Admitting: Gastroenterology

## 2015-12-17 ENCOUNTER — Encounter: Payer: Self-pay | Admitting: Gastroenterology

## 2015-12-17 ENCOUNTER — Ambulatory Visit: Payer: Medicare Other | Admitting: Gastroenterology

## 2015-12-17 VITALS — BP 142/80 | HR 89 | Temp 98.1°F | Ht 65.0 in | Wt 293.0 lb

## 2015-12-17 DIAGNOSIS — D509 Iron deficiency anemia, unspecified: Secondary | ICD-10-CM | POA: Diagnosis not present

## 2015-12-17 DIAGNOSIS — I639 Cerebral infarction, unspecified: Secondary | ICD-10-CM | POA: Diagnosis not present

## 2015-12-17 MED ORDER — FERROUS SULFATE 325 (65 FE) MG PO TABS: 325.0000 mg | ORAL_TABLET | Freq: Two times a day (BID) | ORAL | Status: DC

## 2015-12-17 NOTE — Progress Notes (Signed)
Primary Care Physician: Lorayne Marek, MD  Primary Gastroenterologist:  Dr. Lucilla Lame  Chief Complaint  Patient presents with  . Follow up hospital Anemia    HPI: Mary [name] is a 49 y.o. female here off after having an EGD and colonoscopy for anemia. The patient has not been taking her iron since then. The patient has been the emergency department for strep throat recently although the strep throat test was negative. The patient does have a follow-up with ENT. The patient denies any gross amounts of bleeding although she does report intermittent bright red blood and a very small amount when she moves her bowels. There is no report of any unexplained weight loss fevers chills nausea vomiting. The patient has also not reported any abdominal pains.  Current Outpatient Prescriptions  Medication Sig Dispense Refill  . amLODipine (NORVASC) 5 MG tablet Take 5 mg by mouth daily.    Marland Kitchen amoxicillin (AMOXIL) 500 MG capsule Take 1 capsule (500 mg total) by mouth 3 (three) times daily. 30 capsule 0  . azithromycin (ZITHROMAX) 250 MG tablet Take 1 tablet a day for 4 days beginning tomorrow 6 each 0  . metFORMIN (GLUCOPHAGE) 500 MG tablet Take 1 tablet (500 mg total) by mouth 2 (two) times daily with a meal. 180 tablet 3  . metoprolol tartrate (LOPRESSOR) 25 MG tablet Take 1 tablet (25 mg total) by mouth 2 (two) times daily. 180 tablet 3  . tacrolimus (PROTOPIC) 0.1 % ointment Apply 1 application topically 2 (two) times daily as needed (dermatitis).     . triamcinolone cream (KENALOG) 0.1 % Apply 1 application topically as needed (itching).     . warfarin (COUMADIN) 5 MG tablet Take as directed by [facility] (Patient taking differently: Take 5 mg by mouth at bedtime. Take as directed by [facility]) 90 tablet 1  . ferrous sulfate 325 (65 FE) MG tablet Take 1 tablet (325 mg total) by mouth 2 (two) times daily with a meal. (Patient not taking: Reported on 12/17/2015) 60 tablet 0  .  ondansetron (ZOFRAN) 4 MG tablet Take 1 tablet (4 mg total) by mouth daily as needed for nausea or vomiting. (Patient not taking: Reported on 11/09/2015) 10 tablet 0  . predniSONE (STERAPRED UNI-PAK 21 TAB) 10 MG (21) TBPK tablet Take 1 tablet (10 mg total) by mouth daily. Three tab daily for 3 days 2 tab daily for 3 days 1 tab daily for 2 days (Patient not taking: Reported on 12/17/2015) 17 tablet 0   No current facility-administered medications for this visit.    Allergies as of 12/17/2015  . (No Known Allergies)    ROS:  General: Negative for anorexia, weight loss, fever, chills, fatigue, weakness. ENT: Negative for hoarseness, difficulty swallowing , nasal congestion. CV: Negative for chest pain, angina, palpitations, dyspnea on exertion, peripheral edema.  Respiratory: Negative for dyspnea at rest, dyspnea on exertion, cough, sputum, wheezing.  GI: See history of present illness. GU:  Negative for dysuria, hematuria, urinary incontinence, urinary frequency, nocturnal urination.  Endo: Negative for unusual weight change.    Physical Examination:   BP 142/80 mmHg  Pulse 89  Temp(Src) 98.1 F (36.7 C) (Oral)  Ht 5\' 5"  (1.651 m)  Wt 293 lb (132.904 kg)  BMI 48.76 kg/m2  LMP 12/05/2015  General: Well-nourished, well-developed in no acute distress.  Eyes: No icterus. Conjunctivae pink. Mouth: Oropharyngeal mucosa moist and pink , no lesions erythema or exudate. Lungs: Clear to auscultation bilaterally. Non-labored. Heart: Regular rate  and rhythm, no murmurs rubs or gallops.  Abdomen: Bowel sounds are normal, nontender, nondistended, no hepatosplenomegaly or masses, no abdominal bruits or hernia , no rebound or guarding.   Extremities: No lower extremity edema. No clubbing or deformities. Neuro: Alert and oriented x 3.  Grossly intact. Skin: Warm and dry, no jaundice.   Psych: Alert and cooperative, normal mood and affect.  Labs:    Imaging Studies: No results  found.  Assessment and Plan:   Mary [name] is a 49 y.o. y/o female who comes in for iron deficiency anemia who was on Coumadin for a history of strokes. The patient was found to have a adenomatous polyp in the colon on her recent colonoscopy and some internal hemorrhoids. The patient had some bleeding from internal hemorrhoids and the polyp was an adenoma. The patient will need a repeat colonoscopy in 5 years. The patient will also have her blood checked today for a CBC and she will also be starting her iron again. The patient has been explained that if the blood level is still low she may need a capsule endoscopy.   Note: This dictation was prepared with Dragon dictation along with smaller phrase technology. Any transcriptional errors that result from this process are unintentional.

## 2015-12-18 ENCOUNTER — Ambulatory Visit (INDEPENDENT_AMBULATORY_CARE_PROVIDER_SITE_OTHER): Payer: Medicare Other | Admitting: *Deleted

## 2015-12-18 DIAGNOSIS — I639 Cerebral infarction, unspecified: Secondary | ICD-10-CM

## 2015-12-18 LAB — CBC WITH DIFFERENTIAL/PLATELET
BASOS ABS: 0.1 10*3/uL (ref 0.0–0.2)
Basos: 1 %
EOS (ABSOLUTE): 0.1 10*3/uL (ref 0.0–0.4)
EOS: 1 %
Hematocrit: 27.4 % — ABNORMAL LOW (ref 34.0–46.6)
Hemoglobin: 8.3 g/dL — ABNORMAL LOW (ref 11.1–15.9)
IMMATURE GRANS (ABS): 0 10*3/uL (ref 0.0–0.1)
IMMATURE GRANULOCYTES: 0 %
LYMPHS ABS: 2.4 10*3/uL (ref 0.7–3.1)
Lymphs: 41 %
MCH: 17.8 pg — ABNORMAL LOW (ref 26.6–33.0)
MCHC: 30.3 g/dL — ABNORMAL LOW (ref 31.5–35.7)
MCV: 59 fL — ABNORMAL LOW (ref 79–97)
MONOS ABS: 0.4 10*3/uL (ref 0.1–0.9)
Monocytes: 6 %
NEUTROS ABS: 3.1 10*3/uL (ref 1.4–7.0)
Neutrophils: 51 %
PLATELETS: 472 10*3/uL — AB (ref 150–379)
RBC: 4.67 x10E6/uL (ref 3.77–5.28)
RDW: 22.6 % — AB (ref 12.3–15.4)
WBC: 6 10*3/uL (ref 3.4–10.8)

## 2015-12-22 ENCOUNTER — Telehealth: Payer: Self-pay

## 2015-12-22 NOTE — Telephone Encounter (Signed)
-----   Message from Lucilla Lame, MD sent at 12/22/2015  9:41 AM EST ----- Let the patient know her blood count was slightly lower than the previous level but should be coming up when she started her iron.

## 2015-12-22 NOTE — Progress Notes (Signed)
Carelink Summary Report / Loop Recorder 

## 2015-12-22 NOTE — Telephone Encounter (Signed)
#   weeks after starting the iron.

## 2015-12-22 NOTE — Telephone Encounter (Signed)
Pt notified she should repeat CBC 3 weeks after starting iron supplement. Post dated message has been done.

## 2015-12-22 NOTE — Telephone Encounter (Signed)
Pt notified of lab results. When should we repeat CBC?

## 2015-12-23 ENCOUNTER — Ambulatory Visit (INDEPENDENT_AMBULATORY_CARE_PROVIDER_SITE_OTHER): Payer: Medicare Other

## 2015-12-23 ENCOUNTER — Telehealth: Payer: Self-pay

## 2015-12-23 DIAGNOSIS — D649 Anemia, unspecified: Secondary | ICD-10-CM

## 2015-12-23 DIAGNOSIS — Z5181 Encounter for therapeutic drug level monitoring: Secondary | ICD-10-CM

## 2015-12-23 DIAGNOSIS — I639 Cerebral infarction, unspecified: Secondary | ICD-10-CM

## 2015-12-23 LAB — POCT INR: INR: 3.3

## 2015-12-23 NOTE — Telephone Encounter (Signed)
Pt has been notified of lab results and added to recall list to repeat CBC in 3 weeks.

## 2016-01-12 ENCOUNTER — Telehealth: Payer: Self-pay

## 2016-01-12 ENCOUNTER — Other Ambulatory Visit: Payer: Self-pay

## 2016-01-12 DIAGNOSIS — D649 Anemia, unspecified: Secondary | ICD-10-CM

## 2016-01-12 NOTE — Telephone Encounter (Signed)
Contacted pt and informed her she was due a repeat CBC. Pt will go to [facility] to have this done. Order has been put in.

## 2016-01-12 NOTE — Telephone Encounter (Signed)
-----   Message from [place], [place] sent at 12/22/2015  4:45 PM EST ----- Call pt. Needs repeat CBC due to Anemia per Dr. Allen Norris.

## 2016-01-18 ENCOUNTER — Ambulatory Visit (INDEPENDENT_AMBULATORY_CARE_PROVIDER_SITE_OTHER): Payer: Medicare Other | Admitting: *Deleted

## 2016-01-18 DIAGNOSIS — I639 Cerebral infarction, unspecified: Secondary | ICD-10-CM | POA: Diagnosis not present

## 2016-01-18 NOTE — Progress Notes (Signed)
Carelink Summary Report / Loop Recorder 

## 2016-01-19 LAB — CUP PACEART REMOTE DEVICE CHECK: Date Time Interrogation Session: 20161129050500

## 2016-01-20 ENCOUNTER — Ambulatory Visit (INDEPENDENT_AMBULATORY_CARE_PROVIDER_SITE_OTHER): Payer: Medicare Other

## 2016-01-20 DIAGNOSIS — Z5181 Encounter for therapeutic drug level monitoring: Secondary | ICD-10-CM | POA: Diagnosis not present

## 2016-01-20 DIAGNOSIS — I639 Cerebral infarction, unspecified: Secondary | ICD-10-CM

## 2016-01-20 LAB — POCT INR: INR: 2.3

## 2016-01-22 ENCOUNTER — Telehealth: Payer: Self-pay

## 2016-01-22 NOTE — Telephone Encounter (Signed)
[facility], [facility].

## 2016-01-23 LAB — CUP PACEART REMOTE DEVICE CHECK: Date Time Interrogation Session: 20161230233726

## 2016-01-25 ENCOUNTER — Ambulatory Visit: Payer: Self-pay | Admitting: Neurology

## 2016-01-25 ENCOUNTER — Telehealth: Payer: Self-pay

## 2016-01-25 NOTE — Telephone Encounter (Signed)
LFt message for Mary [name] at [facility] at [phone]. Rn explain on vm that patient did not show for appt today. Rn also explain Dr. Erlinda Hong is unable to filled the form out at this time. Rn left contact information.

## 2016-01-25 NOTE — Telephone Encounter (Signed)
Mary [name] @[phone] with [facility] returned your call.  Thanks!

## 2016-01-26 ENCOUNTER — Encounter: Payer: Self-pay | Admitting: Neurology

## 2016-01-26 NOTE — Telephone Encounter (Signed)
Rn call Barbarann Ehlers at [place] at [phone]. Colletta Maryland stated the patient is working part time at [employer], and wants to increase her duties. Rn stated patient had an appointment on 01/25/2016 and was a no show. Pt did reschedule for February 24, 2016. RN explain that there is a 25.00 to have any forms filled out.Colletta Maryland stated [facility] can billed [facility] to pay the 25.00 dollars.  Colletta Maryland stated the forms can wait till February 24, 2016.

## 2016-02-01 DIAGNOSIS — M7752 Other enthesopathy of left foot: Secondary | ICD-10-CM | POA: Diagnosis not present

## 2016-02-01 DIAGNOSIS — L851 Acquired keratosis [keratoderma] palmaris et plantaris: Secondary | ICD-10-CM | POA: Diagnosis not present

## 2016-02-01 DIAGNOSIS — E119 Type 2 diabetes mellitus without complications: Secondary | ICD-10-CM | POA: Diagnosis not present

## 2016-02-01 DIAGNOSIS — Q6689 Other  specified congenital deformities of feet: Secondary | ICD-10-CM | POA: Diagnosis not present

## 2016-02-10 ENCOUNTER — Ambulatory Visit (INDEPENDENT_AMBULATORY_CARE_PROVIDER_SITE_OTHER): Payer: Medicare Other | Admitting: *Deleted

## 2016-02-10 DIAGNOSIS — I639 Cerebral infarction, unspecified: Secondary | ICD-10-CM

## 2016-02-10 DIAGNOSIS — Z5181 Encounter for therapeutic drug level monitoring: Secondary | ICD-10-CM | POA: Diagnosis not present

## 2016-02-10 LAB — POCT INR: INR: 3

## 2016-02-16 ENCOUNTER — Ambulatory Visit (INDEPENDENT_AMBULATORY_CARE_PROVIDER_SITE_OTHER): Payer: Medicare Other | Admitting: *Deleted

## 2016-02-16 DIAGNOSIS — I639 Cerebral infarction, unspecified: Secondary | ICD-10-CM | POA: Diagnosis not present

## 2016-02-17 HISTORY — PX: LAPAROSCOPIC GASTRIC SLEEVE RESECTION: SHX5895

## 2016-02-17 LAB — CUP PACEART REMOTE DEVICE CHECK: Date Time Interrogation Session: 20170301003749

## 2016-02-17 NOTE — Progress Notes (Signed)
Carelink Summary Report / Loop Recorder 

## 2016-02-22 ENCOUNTER — Telehealth: Payer: Self-pay | Admitting: Neurology

## 2016-02-22 NOTE — Telephone Encounter (Signed)
Rn call Mary [name] at [employer] about sending an invoice to the company to have the form filled out. Rn stated usually companies pay with a credit card, debit card or check. Mary [name] stated they are a state program and dont have that. Mary [name] ask can a letter be written requesting the payment. Mary [name] stated they cant pay 25.00 dollars, only 20.00 because of there budget. Letter fax to [place]. Pt has a appt with Dr. Erlinda Hong on Wednesday.She works part time but is trying to work full time.

## 2016-02-22 NOTE — Telephone Encounter (Signed)
Thayer Headings with [facility] is calling and wants to speak with you about the email you sent her about patient.  Phone [phone].  Thanks!

## 2016-02-23 ENCOUNTER — Encounter: Payer: Self-pay | Admitting: Internal Medicine

## 2016-02-23 LAB — CUP PACEART REMOTE DEVICE CHECK: Date Time Interrogation Session: 20170130000631

## 2016-02-23 NOTE — Progress Notes (Signed)
Carelink summary report received. Battery status OK. Normal device function. No new symptom episodes, tachy episodes, brady, or pause episodes. No new AF episodes. Monthly summary reports and ROV/PRN 

## 2016-02-24 ENCOUNTER — Encounter: Payer: Self-pay | Admitting: Neurology

## 2016-02-24 ENCOUNTER — Ambulatory Visit (INDEPENDENT_AMBULATORY_CARE_PROVIDER_SITE_OTHER): Payer: Medicare Other | Admitting: Neurology

## 2016-02-24 VITALS — BP 141/80 | HR 87 | Ht 65.0 in | Wt 293.2 lb

## 2016-02-24 DIAGNOSIS — E669 Obesity, unspecified: Secondary | ICD-10-CM | POA: Diagnosis not present

## 2016-02-24 DIAGNOSIS — D6861 Antiphospholipid syndrome: Secondary | ICD-10-CM | POA: Diagnosis not present

## 2016-02-24 DIAGNOSIS — I63412 Cerebral infarction due to embolism of left middle cerebral artery: Secondary | ICD-10-CM | POA: Diagnosis not present

## 2016-02-24 DIAGNOSIS — I10 Essential (primary) hypertension: Secondary | ICD-10-CM | POA: Diagnosis not present

## 2016-02-24 DIAGNOSIS — R195 Other fecal abnormalities: Secondary | ICD-10-CM

## 2016-02-24 NOTE — Progress Notes (Signed)
STROKE NEUROLOGY FOLLOW UP NOTE  NAME: Mary [name] [name] DOB: July 15, 1966  REASON FOR VISIT: stroke follow up HISTORY FROM: pt and chart  Today we had the pleasure of seeing Mary [name] [name] in follow-up at our Neurology Clinic. Pt was accompanied by no one.   History Summary Mary [name] [name] is a 50 year old lady with PMH of HTN and obesity was admitted for stroke on 01/23/13. She presented with several days history of slurred speech and expressive aphasia. Her symptoms actually began after an ovarian biopsy and she initially came to the ER and was discharged home but symptoms persisted and a few days later she returned and CT scan of the head this time showed a subacute left parietal MCA branch infarct. CT angiogram of the brain showed occluded distal branch of left middle cerebral artery corresponding to the infarct territory. CT angiogram of the neck showed no significant extracranial stenosis. MRI of the brain could not be obtained due to patient's obesity as well as severe claustrophobia. Transthoracic echo showed normal ejection fraction. Transesophageal echocardiogram showed no patent foramen ovale, vegetation or clot. EKG and telemetry monitoring did not reveal and cardiac arrhythmias. ESR,complements. ANA panel, RPR, HIV, antithrombin III and homocystine were all normal. The patient had significant expressive aphasia and was seen by physical, occupational and speech therapy and who was found to have deep vein thrombosis and hence was started on anticoagulation with xarelto. She was discharged and has continued to have outpatient speech therapy and has obtained some modest improvement in her speech. She still has significant word hesitancy but is able to communicate. She complains of mild headaches every other day for which she takes Tylenol which seemed to work quite well. She works as a Freight forwarder at [employer] and states she will be unable to perform her job in her current situation.   Follow up  04/15/13 - She reports no significant change in her expressive aphasia. She has no new complaints today. She still not working and has questions about when can she drive. TCD bubble study was negative.   Follow up 10/14/13 (LL): she comes back for stroke revisit. She is doing well, PCP changed her from Norvasc to Metoprolol because she was having frequent headaches, which has helped. She stopped Nortriptyline due to lethargy and it was not helping her headaches. She may need to restart Norvasc though, because BP is elevated today, 162/98. She is still in speech therapy .   Follow up 04/18/14 (LL): Since last visit, she returned to working part-time at [employer] in an different job role. She is very frustrated that she is not able to function in her previous capacity. She continues with ST twice weekly. Her blood pressure is better controlled on Norvasc and Metoprolol, though elevated in the office today at 155/93. She states her headaches are less frequent.   Follow up 10/07/14 - the patient has been doing OK. She is on norvac and metoprolol for BP control and today is 123/80. However, she stated that last Friday, she was working in [place], she felt funny feeling, not able to do what she was able to do before, she kept asking her co-workers, how to do this, how to do that, she has some difficulty understanding and express herself. She was thinking and talking slow, she felt not herself. On Saturday, she felt fine. She has not back to work yet. She went to her PCP and was told to be off work for a week and follow up  with neurology.   She was started on Xarelto for DVT in 01/2013 but stopped after 3 months. Suppose on ASA 81mg as per chart, but currently she is not on any antiplatelet. She also had loop recorder placed in 05/2014 by Dr. Allred and so far no Afib episodes found. However, her hypercoagulable work up initially showed positive lupus coagulant, no repeat done yet.  Follow up 11/17/14 - she had EEG  which did not show seizure like activity but left temporal slowing which is consistent with her stroke in the past. Her repeat lab test showed positive sickle cell screening and again positive lupus anticoagulant panel with high hexagonal phospholipid Neutral. She has not seen hematology yet. Her loop recorder so far is negative for AF episodes. She stated that she continued to have intermittent episodes with word finding difficulties, difficulty with comprehension and confusion at work, especially when she feels tired and stressed out, but she stated that she gradually is getting better than before. BP today 143/89.  Follow up 01/15/15 - she was doing the same. She still has some intermittent confusion and word finding difficulties but she has got used to it. She followed up with PCP and oncologist Dr. Shadad and considered antiphospholipid syndrome and agreed on anticoagulation with coumadin. INR goal 2.5-3.5. Her INR fluctuates and currently following with coumadin clinic. INR last check 2.0. BP 143/89. Loop recorder continued to have no AF episodes.  Follow up 07/16/15 - no Afib episode on loop recorder. Last INR check one month ago was 1.8. She was doing well without stroke like symptoms. She still has some slow to reaction, some difficulty with language output but that is no change from before. She saw Dr. Shadad in 04/2015 and pt would like to continue coumadin for stroke prevention. She has not seen PCP for a while. She is requesting psychology for counseling. BP today is good 119/80.  Interval History During the interval time, pt was admitted on 11/08/15 for acute pharyngitis. But found to have anemia, Hb at 7 (previous at 10). She received one unit of PRBC. EGD negative and colonoscopy found polyp which is adenoma by pathology. She also found to have nonbleeding internal hemorrhoids. Her coumadin restarted. She followed with GI and will repeat colonoscopy in 5 years. Repeat CBC on 12/17/15 Hb 8.3. She  denies any GIB during the interval time. She has plan to go for gastric bypass surgery for obesity and to go back to full time job in [employer].  REVIEW OF SYSTEMS: Full 14 system review of systems performed and notable only for those listed below and in HPI above, all others are negative:  Constitutional:   Cardiovascular: N/A  Ear/Nose/Throat: N/A  Skin: N/A  Eyes: double vision  Respiratory: cough  Gastroitestinal: N/A  Genitourinary: N/A Hematology/Lymphatic: N/A  Endocrine: N/A  Musculoskeletal: N/A  Allergy/Immunology: N/A  Neurological: N/A Psychiatric: snoring  The following represents the patient's updated allergies and side effects list: No Known Allergies  The neurologically relevant items on the patient's problem list were reviewed on today's visit.  Neurologic Examination  A problem focused neurological exam (12 or more points of the single system neurologic examination, vital signs counts as 1 point, cranial nerves count for 8 points) was performed.  Blood pressure 141/80, pulse 87, height 5' 5" (1.651 m), weight 293 lb 3.2 oz (132.995 kg).  General - Well nourished, well developed, in no apparent distress.  Ophthalmologic - not able to see through  Cardiovascular - Regular rate and rhythm   with no murmur.  Mental Status -  Level of arousal and orientation to time, place, and person were intact. Language including expression, naming, repetition, comprehension was assessed and found intact.  Cranial Nerves II - XII - II - Visual field intact OU. III, IV, VI - Extraocular movements intact. V - Facial sensation intact bilaterally. VII - Facial movement intact bilaterally. VIII - Hearing & vestibular intact bilaterally. X - Palate elevates symmetrically. XI - Chin turning & shoulder shrug intact bilaterally. XII - Tongue protrusion intact.  Motor Strength - The patient's strength was normal in all extremities and pronator drift was absent.  Bulk was normal and  fasciculations were absent.   Motor Tone - Muscle tone was assessed at the neck and appendages and was normal.  Reflexes - The patient's reflexes were normal in all extremities and she had no pathological reflexes.  Sensory - Light touch, temperature/pinprick, vibration and proprioception, and Romberg testing were assessed and were normal.    Coordination - The patient had normal movements in the hands and feet with no ataxia or dysmetria.  Tremor was absent.  Gait and Station - The patient's transfers, posture, gait, station, and turns were observed as normal.  Data reviewed: I personally reviewed the images and agree with the radiology interpretations.  01/23/13 CT head 1. Early subacute left parietal lobe infarct.  2. No evidence of intracranial hemorrhage, mass effect, or midline shift.  3. Chronic small vessel disease and old deep white matter infarcts are seen bilaterally  01/24/14 CTA head and neck 1. No significant stenosis of the cervical vasculature.  2. Mild atherosclerotic irregularity of the left carotid bifurcation without significant stenosis 3. Occluded distal left MCA branch vessel corresponding to the  infarct territory.  4. Moderate small vessel disease.  5. Mild atherosclerotic irregularity within the cavernous carotid  arteries bilaterally without significant stenosis.  TCD bubble study - 04/26/13 No R to L shunt, negative bubble study.  EEG 10/22/14 - This is an abnormal EEG recording secondary to intermittent theta slowing emanating from the left temporal region. This slowing may correlate with the prior stroke event, and no clear epileptiform discharges were seen during the study. Clinical correlation is required.  Component     Latest Ref Rng 01/24/2013 05/30/2014 10/07/2014  Prothrombin Time        13.7  INR        1.1  APTT        40.1 (H)  APTT 1:1 NP        33.7  APTT 1:1 Saline        50.7  THROMBIN TIME        16.7  DRVVT Screen Seconds         42.6  DRVVT Confirm Seconds        CANCELED  DRVVT Ratio        CANCELED  Hexagonal Phospholipid Neutral        9 (H)  Platelet Neutralization        5.7 (H)  Anticardiolipin Ab, IgG        CANCELED  Anticardiolipin Ab, IgM        CANCELED  Beta-2 Glycoprotein I, IgG        CANCELED  Beta-2 Glycoprotein I, IgM        CANCELED  Beta-2 Glycoprotein I, IgA        CANCELED  LAC Interpretation        Comment  PTT Lupus Anticoagulant       28.0 - 43.0 secs 44.3 (H)    PTTLA Confirmation     <8.0 secs 11.5 (H)    PTTLA 4:1 Mix     28.0 - 43.0 secs 45.6 (H)    DRVVT     <42.9 secs 38.8    Drvvt confirmation     <1.11 Ratio NOT APPL    dRVVT Incubated 1:1 Mix     <42.9 secs NOT APPL    Lupus Anticoagulant     NOT DETECTED DETECTED (A)    Total Protein     6.0 - 8.5 g/dL   7.9  Albumin     3.5 - 5.5 g/dL   3.9  Total Bilirubin     0.0 - 1.2 mg/dL   0.3  Bilirubin, Direct     0.00 - 0.40 mg/dL   0.10  Alkaline Phosphatase     39 - 117 IU/L   71  AST     0 - 40 IU/L   17  ALT     0 - 32 IU/L   16  ENA RNP Ab     0.0 - 0.9 AI   0.3  ENA SM Ab Ser-aCnc     0.0 - 0.9 AI   <0.2  Rhuematoid fact SerPl-aCnc     0.0 - 13.9 IU/mL   11.0  Chromatin Ab SerPl-aCnc     0.0 - 0.9 AI   <0.2  ENA SSA (RO) Ab     0.0 - 0.9 AI   <0.2  ENA SSB (LA) Ab     0.0 - 0.9 AI   <0.2  dsDNA Ab     0 - 9 IU/mL   1  Cholesterol     0 - 200 mg/dL  126   Triglycerides     <150 mg/dL  91   HDL     >39 mg/dL  40   Total CHOL/HDL Ratio       3.2   VLDL     0 - 40 mg/dL  18   LDL (calc)     0 - 99 mg/dL  68   Anticardiolipin IgG     0 - 14 GPL U/mL 4 (L)  <9  Anticardiolipin IgM     0 - 12 MPL U/mL 3 (L)  <9  Anticardiolipin IgA     <22 APL U/mL 7 (L)    C-ANCA     Neg:<1:20 titer   <1:20  P-ANCA     Neg:<1:20 titer   <1:20  Atypical pANCA     Neg:<1:20 titer   <1:20  Beta-2 Glyco I IgG     0 - 20 GPI IgG units   <9  Beta-2 Glyco 1 IgA     0 - 25 GPI IgA units   31 (H)    Beta-2 Glyco 1 IgM     0 - 32 GPI IgM units   <9  Hgb A1c MFr Bld     4.8 - 5.6 %   6.3 (H)  Est. average glucose Bld gHb Est-mCnc        134  C3 Complement     90 - 180 mg/dL 160    Complement C4, Body Fluid     10 - 40 mg/dL 34    ANA Ser Ql     Negative NEGATIVE  Negative  RPR     NON REACTIVE NON REACTIVE    HIV     NON REACTIVE NON REACTIVE  AntiThromb III Func     75 - 120 % 107    Protein C Activity     74 - 151 % 153 (H)  144  Protein C, Total     72 - 160 % 88    Protein S Activity     60 - 145 % 55 (L)  66  Protein S Ag, Total     60 - 150 % 66    Homocysteine     4.0 - 15.4 umol/L 8.1    TSH     0.350 - 4.500 uIU/mL  1.903   Sickle Cell Prep     Negative   Positive (A)  Homocysteine     0.0 - 15.0 umol/L   9.9   Component     Latest Ref Rng 04/17/2015  PTT Lupus Anticoagulant     28.0 - 43.0 secs 53.7 (H)  PTTLA 4:1 Mix     28.0 - 43.0 secs 44.0 (H)  PTTLA Confirmation     <8.0 secs 4.3  DRVVT     <42.9 secs 40.8  DRVVT 1:1 Mix     <42.9 secs NOT APPL  Drvvt confirmation     <1.15 Ratio NOT APPL  Lupus Anticoagulant     NOT DETECTED NOT DETECTED  HGB A     96.8 - 97.8 % 65.8 (L)  Hgb A2 Quant     2.2 - 3.2 % 2.8  Hgb F Quant     0.0 - 2.0 % 0.0  Hgb S Quant     0.0 % 31.4 (H)  Hemoglobin Other     0.0 % 0.0  Beta-2 Glyco I IgG     <20 G Units 6  Beta-2-Glycoprotein I IgM     <20 M Units 5  Beta-2-Glycoprotein I IgA     <20 A Units 7    Assessment: As you may recall, she is a 50 y.o. African American female with PMH of HTN and obesity was admitted on 01/2013 for left MCA stroke. Head CT showed bilateral old WM infarcts too. She was found to have DVT and put on Xarelto which was stopped after 3 months. Hypercoagulable work up questions for antiphospholipid antibody syndrome. Repeat hypercoagulable work up again showed positive for lupus anticoagulant panel as well as positive sickle cell screening.  According to her history, young,  female, with multiple arterial and venous thrombosis with positive anticoagulant tests x 2, antiphospholipid antibody syndrome is considered until proven otherwise. She also saw Dr. Alen Blew for APL and positive sickle cell screening, Dr. Alen Blew agreed on anticoagulation with coumadin with INR 2.5-3.5. Currently she is following with coumadin clinic. Her recent INR was low. She followed with Dr. Alen Blew and continued on coumadin. Sickle cell trait no need intervention.   Had anemia in 10/2015 requiring one unit PRBC, EGD and colonoscopy showed intestine polyp, was removed and showed adenoma. Will need colonoscopy in 5 years. Repeat CBC stable in 11/2015.   Plan:  - continue coumadin for APL syndrome - INR goal 2.5-3.5. Follow up with coumadin clinic - Follow up with your primary care physician for stroke risk factor modification. Recommend maintain blood pressure goal <130/80, diabetes with hemoglobin A1c goal below 6.5% and lipids with LDL cholesterol goal below 70 mg/dL.  - will repeat CBC today to monitor Hb. - check BP at home. - regular exercise and weight loss. Has appointment to consider gastric bypass surgery - follow up in 6 months.  I spent more than  25 minutes of face to face time with the patient. Greater than 50% of time was spent in counseling and coordination of care. We discussed about anemia monitoring, continue of coumadin and gastric bypass surgery.    Orders Placed This Encounter  Procedures  . CBC (no diff)    No orders of the defined types were placed in this encounter.    Patient Instructions  - continue coumadin for APL syndrome - INR goal 2.5-3.5. Follow up with coumadin clinic - Follow up with your primary care physician for stroke risk factor modification. Recommend maintain blood pressure goal <130/80, diabetes with hemoglobin A1c goal below 6.5% and lipids with LDL cholesterol goal below 70 mg/dL.  - will repeat CBC today to monitor Hb. - check BP at home. -  regular exercise and weight loss. Has appointment to consider gastric bypass surgery - follow up in 6 months.    Jindong Xu, MD PhD [facility] [address], [address] [place], [place] [address] [phone] 

## 2016-02-24 NOTE — Telephone Encounter (Signed)
Rn talk to [place] at [employer]. Thayer Headings stated the invoice was sent and approve for payment for 20.00 to have forms filled out and release to [facility]. Thayer Headings was transferred to [facility] in billing. Angie was given a copy of the invoice.

## 2016-02-24 NOTE — Patient Instructions (Signed)
-   continue coumadin for APL syndrome - INR goal 2.5-3.5. Follow up with coumadin clinic - Follow up with your primary care physician for stroke risk factor modification. Recommend maintain blood pressure goal <130/80, diabetes with hemoglobin A1c goal below 6.5% and lipids with LDL cholesterol goal below 70 mg/dL.  - will repeat CBC today to monitor Hb. - check BP at home. - regular exercise and weight loss. Has appointment to consider gastric bypass surgery - follow up in 6 months.

## 2016-02-25 LAB — CBC
Hematocrit: 31.2 % — ABNORMAL LOW (ref 34.0–46.6)
Hemoglobin: 9.3 g/dL — ABNORMAL LOW (ref 11.1–15.9)
MCH: 19.3 pg — ABNORMAL LOW (ref 26.6–33.0)
MCHC: 29.8 g/dL — ABNORMAL LOW (ref 31.5–35.7)
MCV: 65 fL — AB (ref 79–97)
PLATELETS: 308 10*3/uL (ref 150–379)
RBC: 4.83 x10E6/uL (ref 3.77–5.28)
RDW: 22.4 % — AB (ref 12.3–15.4)
WBC: 5.4 10*3/uL (ref 3.4–10.8)

## 2016-02-29 ENCOUNTER — Encounter: Payer: Self-pay | Admitting: Internal Medicine

## 2016-02-29 ENCOUNTER — Other Ambulatory Visit: Payer: Self-pay | Admitting: Internal Medicine

## 2016-02-29 ENCOUNTER — Ambulatory Visit: Payer: Medicare Other | Attending: Internal Medicine | Admitting: Internal Medicine

## 2016-02-29 VITALS — BP 147/94 | HR 75 | Temp 97.3°F | Resp 18 | Ht 65.0 in | Wt 298.0 lb

## 2016-02-29 DIAGNOSIS — E119 Type 2 diabetes mellitus without complications: Secondary | ICD-10-CM | POA: Insufficient documentation

## 2016-02-29 DIAGNOSIS — I639 Cerebral infarction, unspecified: Secondary | ICD-10-CM | POA: Diagnosis not present

## 2016-02-29 DIAGNOSIS — R739 Hyperglycemia, unspecified: Secondary | ICD-10-CM

## 2016-02-29 DIAGNOSIS — D509 Iron deficiency anemia, unspecified: Secondary | ICD-10-CM

## 2016-02-29 DIAGNOSIS — Z79899 Other long term (current) drug therapy: Secondary | ICD-10-CM | POA: Insufficient documentation

## 2016-02-29 DIAGNOSIS — Z0001 Encounter for general adult medical examination with abnormal findings: Secondary | ICD-10-CM | POA: Diagnosis not present

## 2016-02-29 DIAGNOSIS — R4701 Aphasia: Secondary | ICD-10-CM | POA: Insufficient documentation

## 2016-02-29 DIAGNOSIS — J029 Acute pharyngitis, unspecified: Secondary | ICD-10-CM | POA: Insufficient documentation

## 2016-02-29 DIAGNOSIS — D6861 Antiphospholipid syndrome: Secondary | ICD-10-CM

## 2016-02-29 DIAGNOSIS — F40240 Claustrophobia: Secondary | ICD-10-CM | POA: Insufficient documentation

## 2016-02-29 DIAGNOSIS — Z7901 Long term (current) use of anticoagulants: Secondary | ICD-10-CM | POA: Diagnosis not present

## 2016-02-29 DIAGNOSIS — I10 Essential (primary) hypertension: Secondary | ICD-10-CM

## 2016-02-29 DIAGNOSIS — Z8673 Personal history of transient ischemic attack (TIA), and cerebral infarction without residual deficits: Secondary | ICD-10-CM | POA: Diagnosis not present

## 2016-02-29 DIAGNOSIS — I63412 Cerebral infarction due to embolism of left middle cerebral artery: Secondary | ICD-10-CM

## 2016-02-29 DIAGNOSIS — Z7984 Long term (current) use of oral hypoglycemic drugs: Secondary | ICD-10-CM | POA: Diagnosis not present

## 2016-02-29 DIAGNOSIS — R7303 Prediabetes: Secondary | ICD-10-CM | POA: Diagnosis not present

## 2016-02-29 DIAGNOSIS — Z0000 Encounter for general adult medical examination without abnormal findings: Secondary | ICD-10-CM

## 2016-02-29 DIAGNOSIS — D649 Anemia, unspecified: Secondary | ICD-10-CM

## 2016-02-29 DIAGNOSIS — Z131 Encounter for screening for diabetes mellitus: Secondary | ICD-10-CM

## 2016-02-29 DIAGNOSIS — Z9189 Other specified personal risk factors, not elsewhere classified: Secondary | ICD-10-CM

## 2016-02-29 LAB — POCT INR: INR: 2.17

## 2016-02-29 LAB — GLUCOSE, POCT (MANUAL RESULT ENTRY): POC Glucose: 76 mg/dl (ref 70–99)

## 2016-02-29 LAB — POCT GLYCOSYLATED HEMOGLOBIN (HGB A1C): HEMOGLOBIN A1C: 5.5

## 2016-02-29 MED ORDER — AMLODIPINE BESYLATE 10 MG PO TABS: 10.0000 mg | ORAL_TABLET | Freq: Every day | ORAL | Status: DC

## 2016-02-29 NOTE — Progress Notes (Addendum)
Mary [name], is a 50 y.o. female  [id number]  [id number]  DOB - Nov 28, 1966  CC:  Chief Complaint  Patient presents with  . Establish Care  . Annual Exam       HPI: Mary [name] is a 50 y.o. female here today to establish medical care.  Mary [name] is a 50 year old lady with PMH of HTN and obesity was admitted for stroke on 01/23/13. She presented with several days history of slurred speech and expressive aphasia.  CT scan of the head this time showed a subacute left parietal MCA branch infarct. CT angiogram of the brain showed occluded distal branch of left middle cerebral artery corresponding to the infarct territory. CT angiogram of the neck showed no significant extracranial stenosis. MRI of the brain could not be obtained due to patient's obesity as well as severe claustrophobia. Transthoracic echo showed normal ejection fraction. Transesophageal echocardiogram showed no patent foramen ovale, vegetation or clot. EKG and telemetry monitoring did not reveal and cardiac arrhythmias.   Found to have [place] and started on Xarelto than as well.  Subsequently, 2015, her repeat lab test showed positive sickle cell screening and again positive lupus anticoagulant panel with high hexagonal phospholipid Neutral.  Loop recorder neg for Afib.  Currently followed by Cardiology Coumadin clinic for coumadin, goal inr 2.5-3.5.    Has appt w/ gastic bypass surgery end of this month for evaluation.  Needs refill on norvasc.  Eating well, denies exercising, denies smoking or drinking.  Patient has No headache, No chest pain, No abdominal pain - No Nausea, No new weakness tingling or numbness, No Cough - SOB.  Co of some intermittant sore throat, but recently URI symptoms.   Per patient, had mammogram 2016 and was normal.  No Known Allergies Past Medical History  Diagnosis Date  . Hypertension   . Other and unspecified ovarian cysts   . Stroke ([place])   . Diabetes mellitus without complication  ([place])   . Blood transfusion without reported diagnosis     transfusion December 2016   Current Outpatient Prescriptions on File Prior to Visit  Medication Sig Dispense Refill  . amLODipine (NORVASC) 5 MG tablet Take 5 mg by mouth daily.    . metFORMIN (GLUCOPHAGE) 500 MG tablet Take 1 tablet (500 mg total) by mouth 2 (two) times daily with a meal. 180 tablet 3  . metoprolol tartrate (LOPRESSOR) 25 MG tablet Take 1 tablet (25 mg total) by mouth 2 (two) times daily. 180 tablet 3  . ondansetron (ZOFRAN) 4 MG tablet Take 1 tablet (4 mg total) by mouth daily as needed for nausea or vomiting. 10 tablet 0  . warfarin (COUMADIN) 5 MG tablet Take as directed by Coumadin Clinic (Patient taking differently: Take 5 mg by mouth at bedtime. Take as directed by Coumadin Clinic) 90 tablet 1   No current facility-administered medications on file prior to visit.   Family History  Problem Relation Age of Onset  . Diabetes Mellitus II Mother   . Diabetes Mother   . Stroke Mother   . Stroke Other   . Diabetes Other   . Transient ischemic attack Father   . Stroke Father   . Diabetes Sister    Social History   Social History  . Marital Status: Single    Spouse Name: N/A  . Number of Children: 2  . Years of Education: 13   Occupational History  . Not on file.   Social History Main Topics  .  Smoking status: Never Smoker   . Smokeless tobacco: Never Used  . Alcohol Use: No  . Drug Use: No  . Sexual Activity: Yes   Other Topics Concern  . Not on file   Social History Narrative   Patient is single with two children.   Patient is right handed.   Patient has 13 yrs of education.   Patient drinks 5 sodas daily.    Review of Systems: Constitutional: Negative for fever, chills, diaphoresis, activity change, appetite change and fatigue. HENT: Negative for ear pain, nosebleeds, congestion, facial swelling, rhinorrhea, neck pain, neck stiffness and ear discharge.  +mild sore throat, but no  cough/sputum production. Eyes: Negative for pain, discharge, redness, itching and visual disturbance. Respiratory: Negative for cough, choking, chest tightness, shortness of breath, wheezing and stridor.  Cardiovascular: Negative for chest pain, palpitations and leg swelling. Gastrointestinal: Negative for abdominal distention. Genitourinary: Negative for dysuria, urgency, frequency, hematuria, flank pain, decreased urine volume, difficulty urinating and dyspareunia.  Musculoskeletal: Negative for back pain, joint swelling, arthralgia and gait problem. Neurological: Negative for dizziness, tremors, seizures, syncope, facial asymmetry, speech difficulty, weakness, light-headedness, numbness and headaches.  Hematological: Negative for adenopathy. Does not bruise/bleed easily. Psychiatric/Behavioral: Negative for hallucinations, behavioral problems, confusion, dysphoric mood, decreased concentration and agitation.    Objective:   Filed Vitals:   02/29/16 1008  BP: 147/94  Pulse: 75  Temp: 97.3 F (36.3 C)  Resp: 18    Physical Exam: Constitutional: Patient appears well-developed and well-nourished. No distress.  aaox3 Morbid obese.  Some stuttering/word finding/expressive aphasia noted, but easilty understandable. HENT: Normocephalic, atraumatic, External right and left ear normal. Oropharynx is clear and moist.  Eyes: Conjunctivae and EOM are normal. PERRL, no scleral icterus. Neck: Normal ROM. Neck supple. No JVD. No tracheal deviation. No thyromegaly.  Thick neck CVS: RRR, S1/S2 +, no murmurs, no gallops, no carotid bruit.  Pulmonary: Effort and breath sounds normal, no stridor, rhonchi, wheezes, rales.  Abdominal: Soft. BS +, no distension, tenderness, rebound or guarding.   Difficult exam 2nd to body habitus. Musculoskeletal: Normal range of motion. No edema and no tenderness.  Lymphadenopathy: No lymphadenopathy noted, cervical. Neuro: Alert. Normal reflexes, muscle tone  coordination. No cranial nerve deficit. Skin: Skin is warm and dry. No rash noted. Not diaphoretic. No erythema. No pallor. Psychiatric: Normal mood and affect. Behavior, judgment, thought content normal.  Pleasant.    Lab Results  Component Value Date   WBC 5.4 02/24/2016   HGB 8.7* 12/12/2015   HCT 31.2* 02/24/2016   MCV 65* 02/24/2016   PLT 308 02/24/2016   Lab Results  Component Value Date   CREATININE 0.70 11/11/2015   BUN 9 11/11/2015   NA 138 11/11/2015   K 3.5 11/11/2015   CL 107 11/11/2015   CO2 24 11/11/2015   3//8/17 hh 9.3/31.2  Lab Results  Component Value Date   HGBA1C 6.6* 07/22/2015   Lipid Panel     Component Value Date/Time   CHOL 128 07/22/2015 0944   TRIG 133 07/22/2015 0944   HDL 36* 07/22/2015 0944   CHOLHDL 3.6 07/22/2015 0944   VLDL 27 07/22/2015 0944   LDLCALC 65 07/22/2015 0944      TCD bubble study - 04/26/13 No R to L shunt, negative bubble study.  Colonoscopy - 11/09/16  3mm polyp ascending colon, resected, Adenoma by path, repeat colonscopy in 5 years.  egd - 11/16 negative  Assessment and plan:   1. Iron deficiency anemia - 11/09/15 iron studies cw  iron def anemia. - continue iron pills, recd healthy diet. - cbc hb 3/8 9.3, stable, improving. - chk cbc/iron studies at next visit  2. Antiphospholipid syndrome (HCC) inr 2.17 today, increase coumadin to 7.5mg  x 2 days (Monday and Tuesday), has inr chk scheduled for [place] at Coumadin clnic. - diet/coumadin education given.   3. Morbid obesity, unspecified obesity type ([place]) - seeing gastic bypass surgeon on fu, recd increase diet /exercise.  4. Cryptogenic stroke ([place]) / Cerebral infarction  2014,  - on coumadin for APL and 2nd cva prevention, fu neuro. -  CT angiogram of the brain showed occluded distal branch of left middle cerebral artery corresponding to the infarct territory.  - loop recorder neg for AFib per cards  5. Essential hypertension - stroke prevention/risk  modification, goal <130/80, increase norvasc to 10mg  po qday. rx in chart.  6. Anemia, unspecified - chk iron studies at next visit. - sp 1 unit transfusion 11/16, may be due to iron def.    7. Annual maintenance/  - Recommend maintain blood pressure goal <130/80, diabetes with hemoglobin A1c goal below 6.5% and lipids with LDL cholesterol goal below 70 mg/dL.   - declined tsh today, chk fasting lipid/iron studies/tsh at next visit)  - ldl 65 (07/22/15) - not on current statin.  - cscope 11/16, adenoma polyp, repat cscope in 5 years recd.  - mammogram 2016 normal per family, low risk, repeat in 1-2 years.  Getting report per pt.  Return in about 2 months (around 04/30/2016).    Encourage increasing activity/exercise and healthy lifestyle during visit.  The patient was given clear instructions to go to ER or return to medical center if symptoms don't improve, worsen or new problems develop. The patient verbalized understanding. The patient was told to call to get lab results if they haven't heard anything in the next week.    Maren Reamer, MD, [place], [place]   02/29/2016, 10:22 AM   Addendum/ Hx of hyperglycemia, a1c checked for healthmaintenance.  Continue metformin.

## 2016-02-29 NOTE — Addendum Note (Signed)
Addended byLottie Mussel T on: 02/29/2016 12:03 PM   Modules accepted: Miquel Dunn

## 2016-02-29 NOTE — Addendum Note (Signed)
Addended by: Wonda Olds L on: 02/29/2016 12:01 PM   Modules accepted: Orders, SmartSet

## 2016-02-29 NOTE — Patient Instructions (Signed)
-  it was a pleasure meeting you.  INR 2.17 today.  Take 1 1/2 pills (7.5mg  total daily) coumadin/warfarin tonight (Monday 3/13) and Tuesday 3/14, than have INR checked at coumadin clinic Wednesday.  - fu w/ me in 2-3 months for annual physical. Pap smear.  Fasting if appt in am for lab work as well.  Vitamin K Foods and Warfarin/coumadin Warfarin is a medicine that helps prevent harmful blood clots by causing blood to clot more slowly. It does this by decreasing the activity of vitamin K, which promotes normal blood clotting. For the dose of warfarin you have been prescribed to work well, you need to get about the same amount of vitamin K from your food from day to day. Suddenly getting a lot more vitamin K could cause your blood to clot too quickly. A sudden decrease in vitamin K intake could cause your blood to clot too slowly. These changes in vitamin K intake could lead to dangerous blood clotsor to bleeding. WHAT GENERAL GUIDELINES DO I NEED TO FOLLOW?  Keep your intake of vitamin K consistent from day to day. To do this, you must be aware of which foods contain moderate or high amounts of vitamin K. Listed below are some foods that are very high, high, or moderately high in vitamin K. If you eat these foods, make sure you eat a consistent amount of them from day to day.  Avoid major changes in your diet, or tell your health care provider before changing your diet.  If you take a multivitamin that contains vitamin K, be sure to take it every day.  If you drink green tea, drink the same amount each day. WHAT FOODS ARE VERY HIGH IN VITAMIN K?   Greens, such as Swiss chard and beet, collard, mustard, or turnip greens (fresh or frozen, cooked).  Kale (fresh or frozen, cooked).   Parsley (raw).  Spinach (cooked).  WHAT FOODS ARE HIGH IN VITAMIN K?  Asparagus (frozen, cooked).  Broccoli.   Bok choy (cooked).   Brussels sprouts (fresh or frozen, cooked).  Cabbage  (cooked).  Coleslaw. WHAT FOODS ARE MODERATELY HIGH IN VITAMIN K?  Blueberries.  Black-eyed peas.  Endive (raw).   Green leaf lettuce (raw).   Green scallions (raw).  Kale (raw).  Okra (frozen, cooked).  Plantains (fried).  Romaine lettuce (raw).   Sauerkraut (canned).   Spinach (raw).   This information is not intended to replace advice given to you by your health care provider. Make sure you discuss any questions you have with your health care provider.   Document Released: 10/02/2009 Document Revised: 12/26/2014 Document Reviewed: 10/09/2013 Elsevier Interactive Patient Education [employer].

## 2016-02-29 NOTE — Addendum Note (Signed)
Addended by: [employer] on: 02/29/2016 06:13 PM   Modules accepted: Miquel Dunn

## 2016-02-29 NOTE — Addendum Note (Signed)
Addended by: Wonda Olds L on: 02/29/2016 05:45 PM   Modules accepted: Orders, SmartSet

## 2016-02-29 NOTE — Progress Notes (Signed)
Carelink summary report received. Battery status OK. Normal device function. No new symptom episodes, tachy episodes, brady, or pause episodes. No new AF episodes. Monthly summary reports and ROV/PRN 

## 2016-02-29 NOTE — Progress Notes (Signed)
Patient's present for physical and re-establish care with PCP.  Patient requesting a physical for the sleeve procedure.  Patient also c/o cough, and sore throat x1wk rated pain 2/10.   Patient was tested for strep and return negative for strep.  Patient reports that it was suggested by hospital MD, that she may need to have a tonsillectomy.  Patient has had the flu shot.

## 2016-03-02 ENCOUNTER — Ambulatory Visit (INDEPENDENT_AMBULATORY_CARE_PROVIDER_SITE_OTHER): Payer: Medicare Other

## 2016-03-02 DIAGNOSIS — Z5181 Encounter for therapeutic drug level monitoring: Secondary | ICD-10-CM | POA: Diagnosis not present

## 2016-03-02 DIAGNOSIS — I639 Cerebral infarction, unspecified: Secondary | ICD-10-CM | POA: Diagnosis not present

## 2016-03-02 LAB — POCT INR: INR: 3.2

## 2016-03-08 DIAGNOSIS — Z0289 Encounter for other administrative examinations: Secondary | ICD-10-CM

## 2016-03-09 ENCOUNTER — Other Ambulatory Visit: Payer: Self-pay | Admitting: Bariatrics

## 2016-03-09 DIAGNOSIS — Z8673 Personal history of transient ischemic attack (TIA), and cerebral infarction without residual deficits: Secondary | ICD-10-CM | POA: Diagnosis not present

## 2016-03-09 DIAGNOSIS — R5383 Other fatigue: Secondary | ICD-10-CM | POA: Diagnosis not present

## 2016-03-09 DIAGNOSIS — I10 Essential (primary) hypertension: Secondary | ICD-10-CM | POA: Diagnosis not present

## 2016-03-09 DIAGNOSIS — R5381 Other malaise: Secondary | ICD-10-CM | POA: Diagnosis not present

## 2016-03-09 DIAGNOSIS — E119 Type 2 diabetes mellitus without complications: Secondary | ICD-10-CM | POA: Diagnosis not present

## 2016-03-09 DIAGNOSIS — Z7901 Long term (current) use of anticoagulants: Secondary | ICD-10-CM | POA: Diagnosis not present

## 2016-03-17 ENCOUNTER — Ambulatory Visit (INDEPENDENT_AMBULATORY_CARE_PROVIDER_SITE_OTHER): Payer: Medicare Other | Admitting: *Deleted

## 2016-03-17 DIAGNOSIS — I639 Cerebral infarction, unspecified: Secondary | ICD-10-CM

## 2016-03-18 NOTE — Progress Notes (Signed)
Carelink Summary Report / Loop Recorder 

## 2016-03-22 ENCOUNTER — Telehealth: Payer: Self-pay | Admitting: Neurology

## 2016-03-22 ENCOUNTER — Ambulatory Visit: Payer: Medicare Other

## 2016-03-22 NOTE — Telephone Encounter (Signed)
JANICE from [facility] did receive office notes.

## 2016-03-22 NOTE — Telephone Encounter (Signed)
LFt vm for [facility] Head[facility] if fax was receive of office notes.

## 2016-03-22 NOTE — Telephone Encounter (Signed)
Christella Scheuermann with [facility] is calling and needs additional information to move forward with rehab orders.  Please call.

## 2016-03-22 NOTE — Telephone Encounter (Signed)
Rn call Mary [name] about needing more information. Mary [name] states the release form states they need all office notes and test from [place]. Rn gave fax number of [phone]. Mary [name] will fax release form back to [facility].

## 2016-03-23 ENCOUNTER — Ambulatory Visit
Admission: RE | Admit: 2016-03-23 | Discharge: 2016-03-23 | Disposition: A | Discharge status: Home / Self Care | Payer: Medicare Other | Source: Ambulatory Visit | Attending: Bariatrics | Admitting: Bariatrics

## 2016-03-23 DIAGNOSIS — Z01818 Encounter for other preprocedural examination: Secondary | ICD-10-CM | POA: Diagnosis not present

## 2016-03-23 IMAGING — RF DG UGI W/O KUB
14 of 17 series · 14 of 17 positions shown · non-contrast
Comparison: None.

CLINICAL DATA: Pre bariatric for seizure.

EXAM:
UPPER GI SERIES WITH KUB
TECHNIQUE: After obtaining a scout radiograph a routine upper GI series was
performed using thin and high density barium.
FLUOROSCOPY TIME:  Radiation Exposure Index (as provided by the
fluoroscopic device): 20.8 mGy

[Series 1: cp_standard · 0.25mm/px · 1 of 1 slices shown (1 of 13)]
[im 1/1]
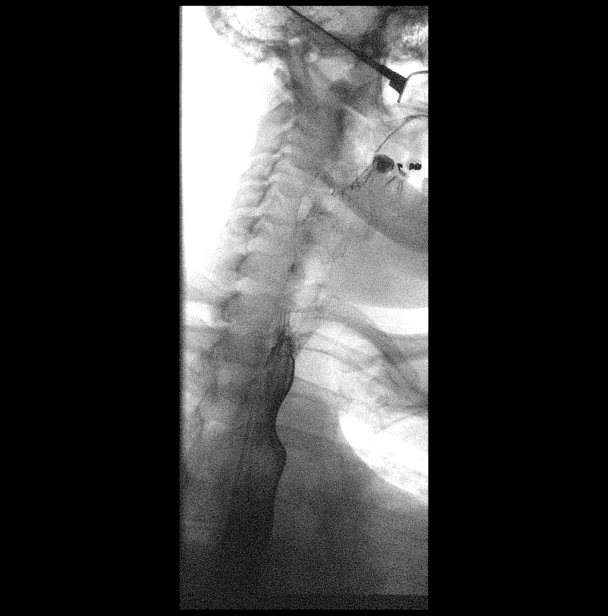

[Series 2: cp_standard · 0.25mm/px · 1 of 1 slices shown (2 of 13)]
[im 1/1]
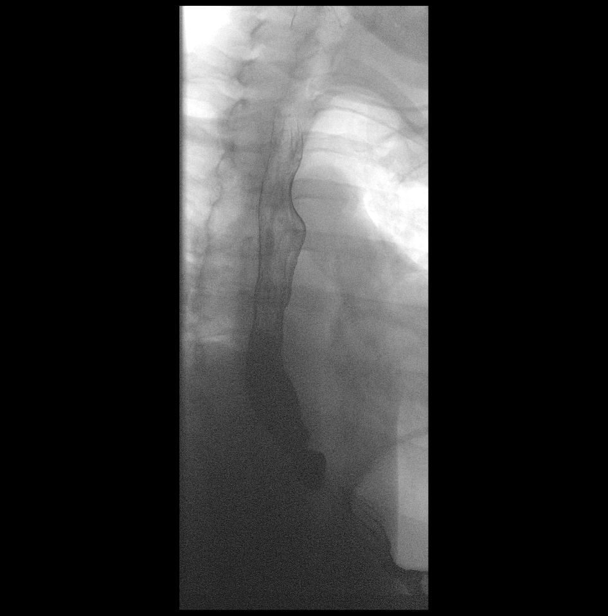

[Series 4: cp_standard · 0.25mm/px · 1 of 1 slices shown (3 of 13)]
[im 1/1]
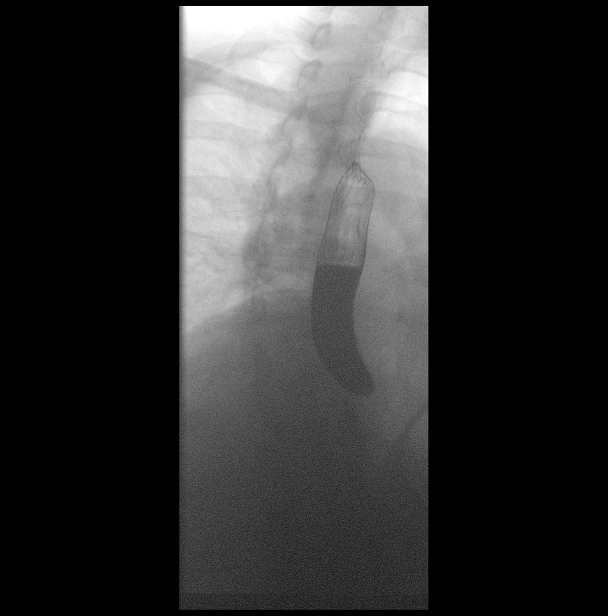

[Series 5: cp_standard · 0.25mm/px · 1 of 1 slices shown (4 of 13)]
[im 1/1]
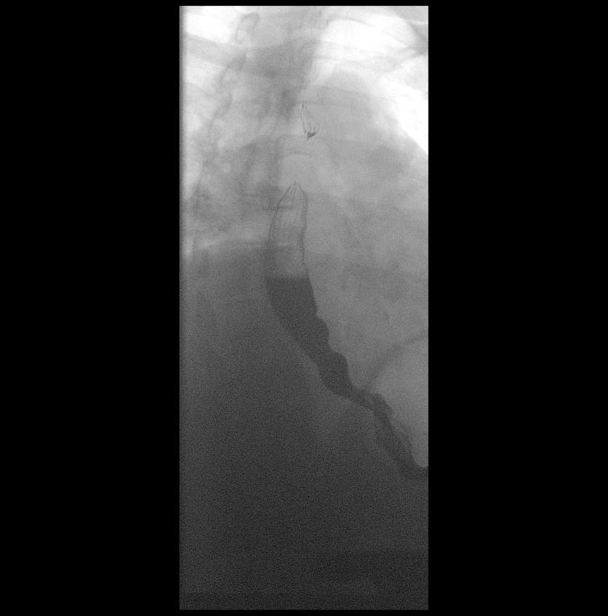

[Series 6: cp_standard · 0.25mm/px · 1 of 1 slices shown (5 of 13)]
[im 1/1]
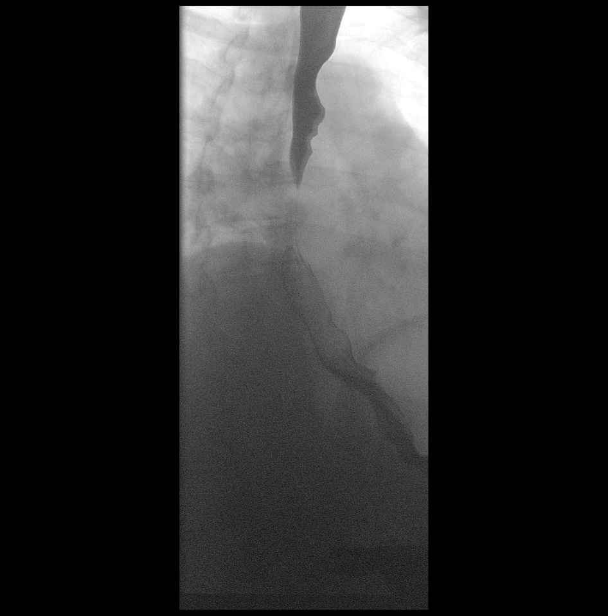

[Series 7: cp_standard · 0.25mm/px · 1 of 1 slices shown (6 of 13)]
[im 1/1]
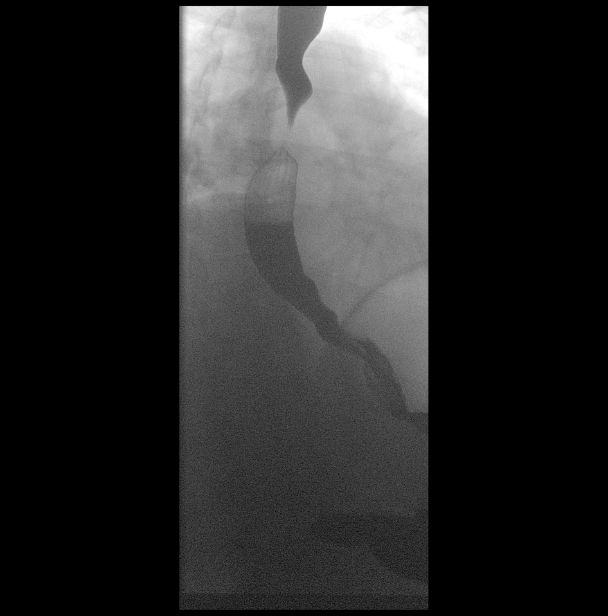

[Series 8: cp_standard · 0.28mm/px · 1 of 1 slices shown (7 of 13)]
[im 1/1]
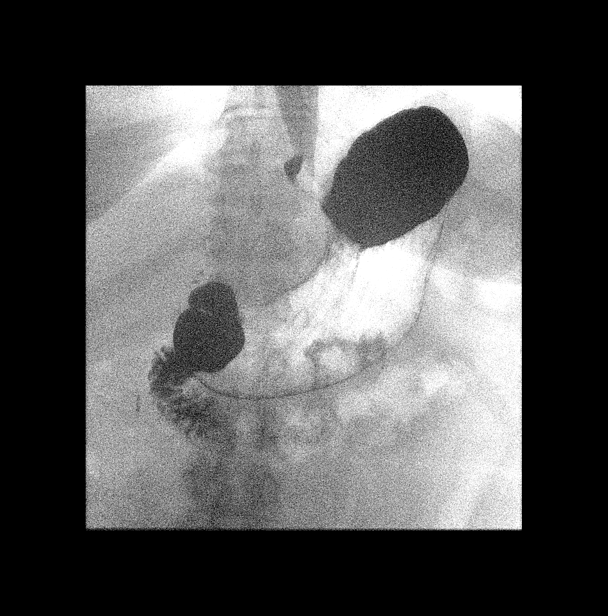

[Series 10: cp_standard · 0.28mm/px · 1 of 1 slices shown (8 of 13)]
[im 1/1]
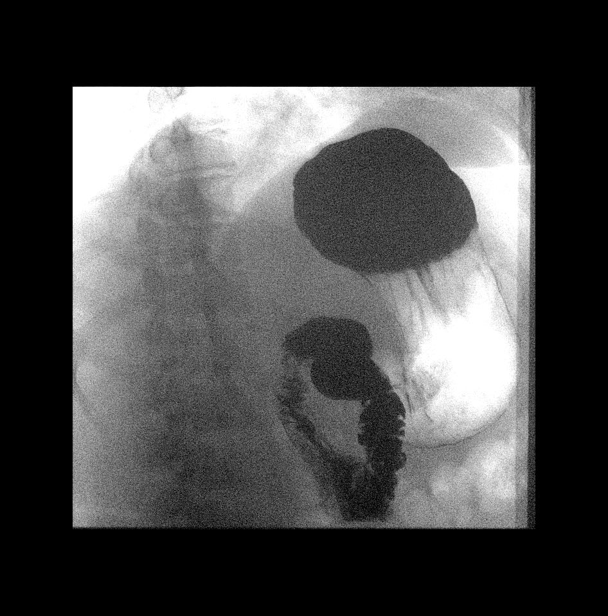

[Series 11: cp_standard · 0.28mm/px · 1 of 1 slices shown (9 of 13)]
[im 1/1]
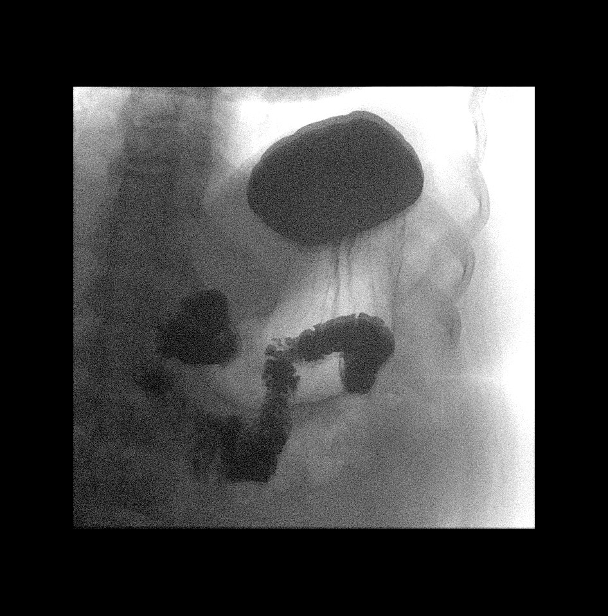

[Series 12: cp_standard · 0.28mm/px · 1 of 1 slices shown (10 of 13)]
[im 1/1]
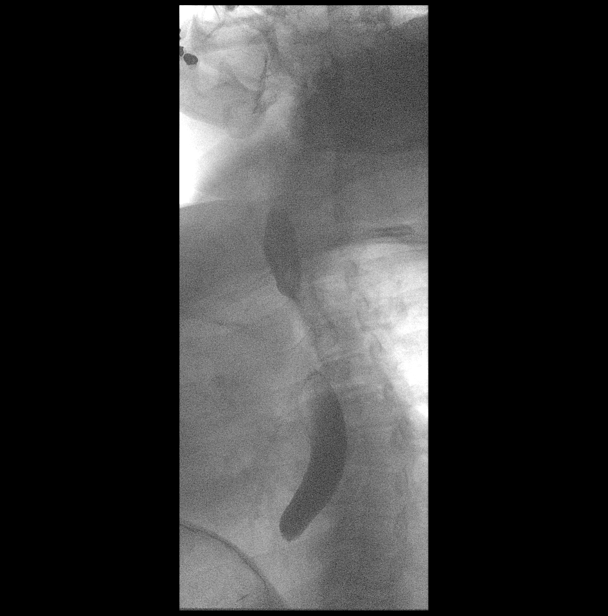

[Series 13: cp_standard · 0.28mm/px · 1 of 1 slices shown (11 of 13)]
[im 1/1]
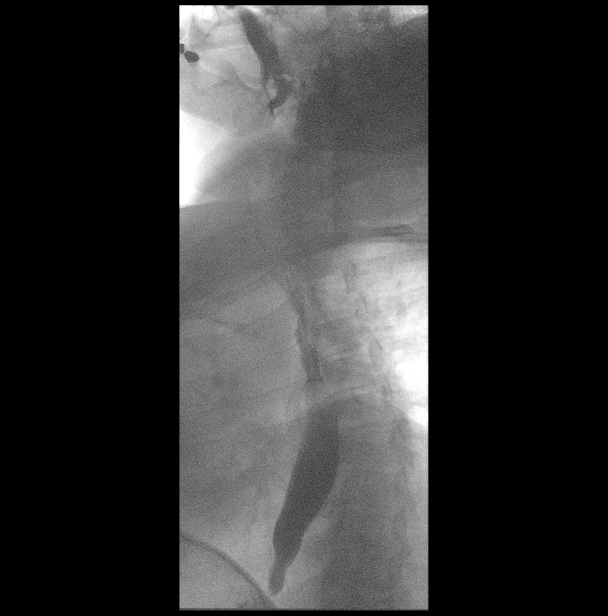

[Series 14: cp_standard · 0.28mm/px · 1 of 1 slices shown (12 of 13)]
[im 1/1]
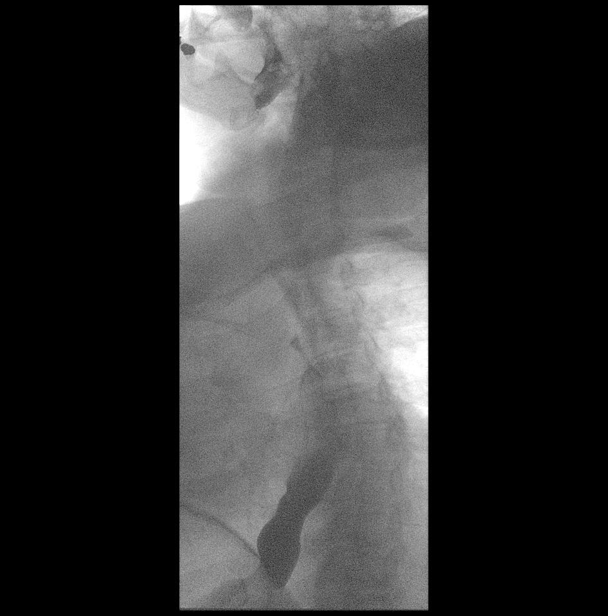

[Series 16: cp_standard · 0.27mm/px · 1 of 1 slices shown (13 of 13)]
[im 1/1]
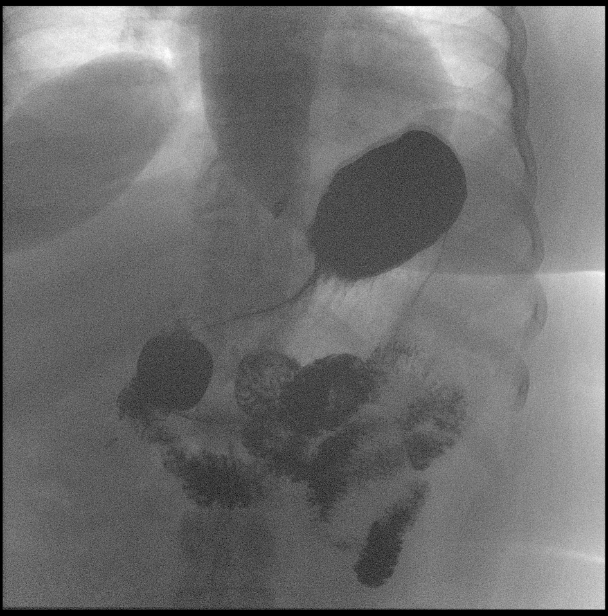

[Series 17: fluoro_barium singleshot_bw · 0.18mm/px · 1 of 1 slices shown]
[im 1/1]
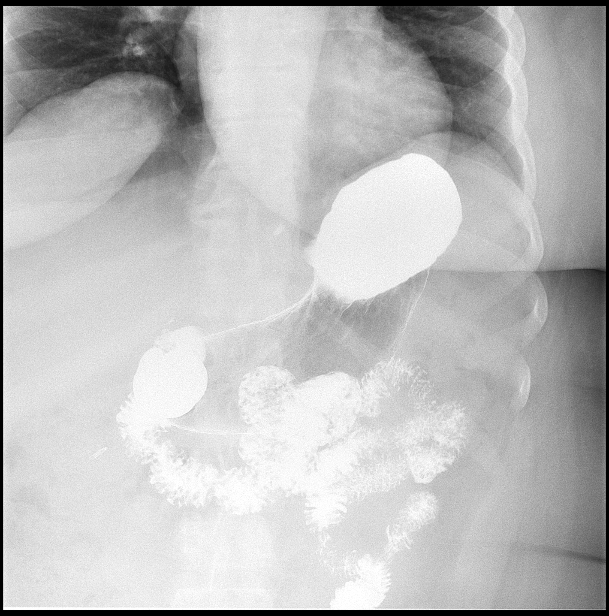

[14 of 17 positions shown; findings below may reference images not displayed]

FINDINGS: Examination of the esophagus demonstrated normal esophageal
motility. Normal esophageal morphology without evidence of
esophagitis or ulceration. No esophageal stricture, diverticula, or
mass lesion. No evidence of hiatal hernia. There is no spontaneous
or inducible gastroesophageal reflux.

Examination of the stomach demonstrated normal rugal folds and areae
gastricae. The gastric mucosa appeared unremarkable without evidence
of ulceration, scarring, or mass lesion. Gastric motility and
emptying was normal. Fluoroscopic examination of the duodenum
demonstrates normal motility and morphology without evidence of
ulceration or mass lesion.
IMPRESSION: Normal upper GI.

## 2016-03-23 IMAGING — CR DG CHEST 2V
1 series · 2 of 2 positions shown · non-contrast
Comparison: [DATE]

CLINICAL DATA: Preop for weight loss surgery

EXAM:
CHEST  2 VIEW

[Series 1: dg chest 2 view · 0.14mm/px · 2 of 2 slices shown]
[im 1/2]
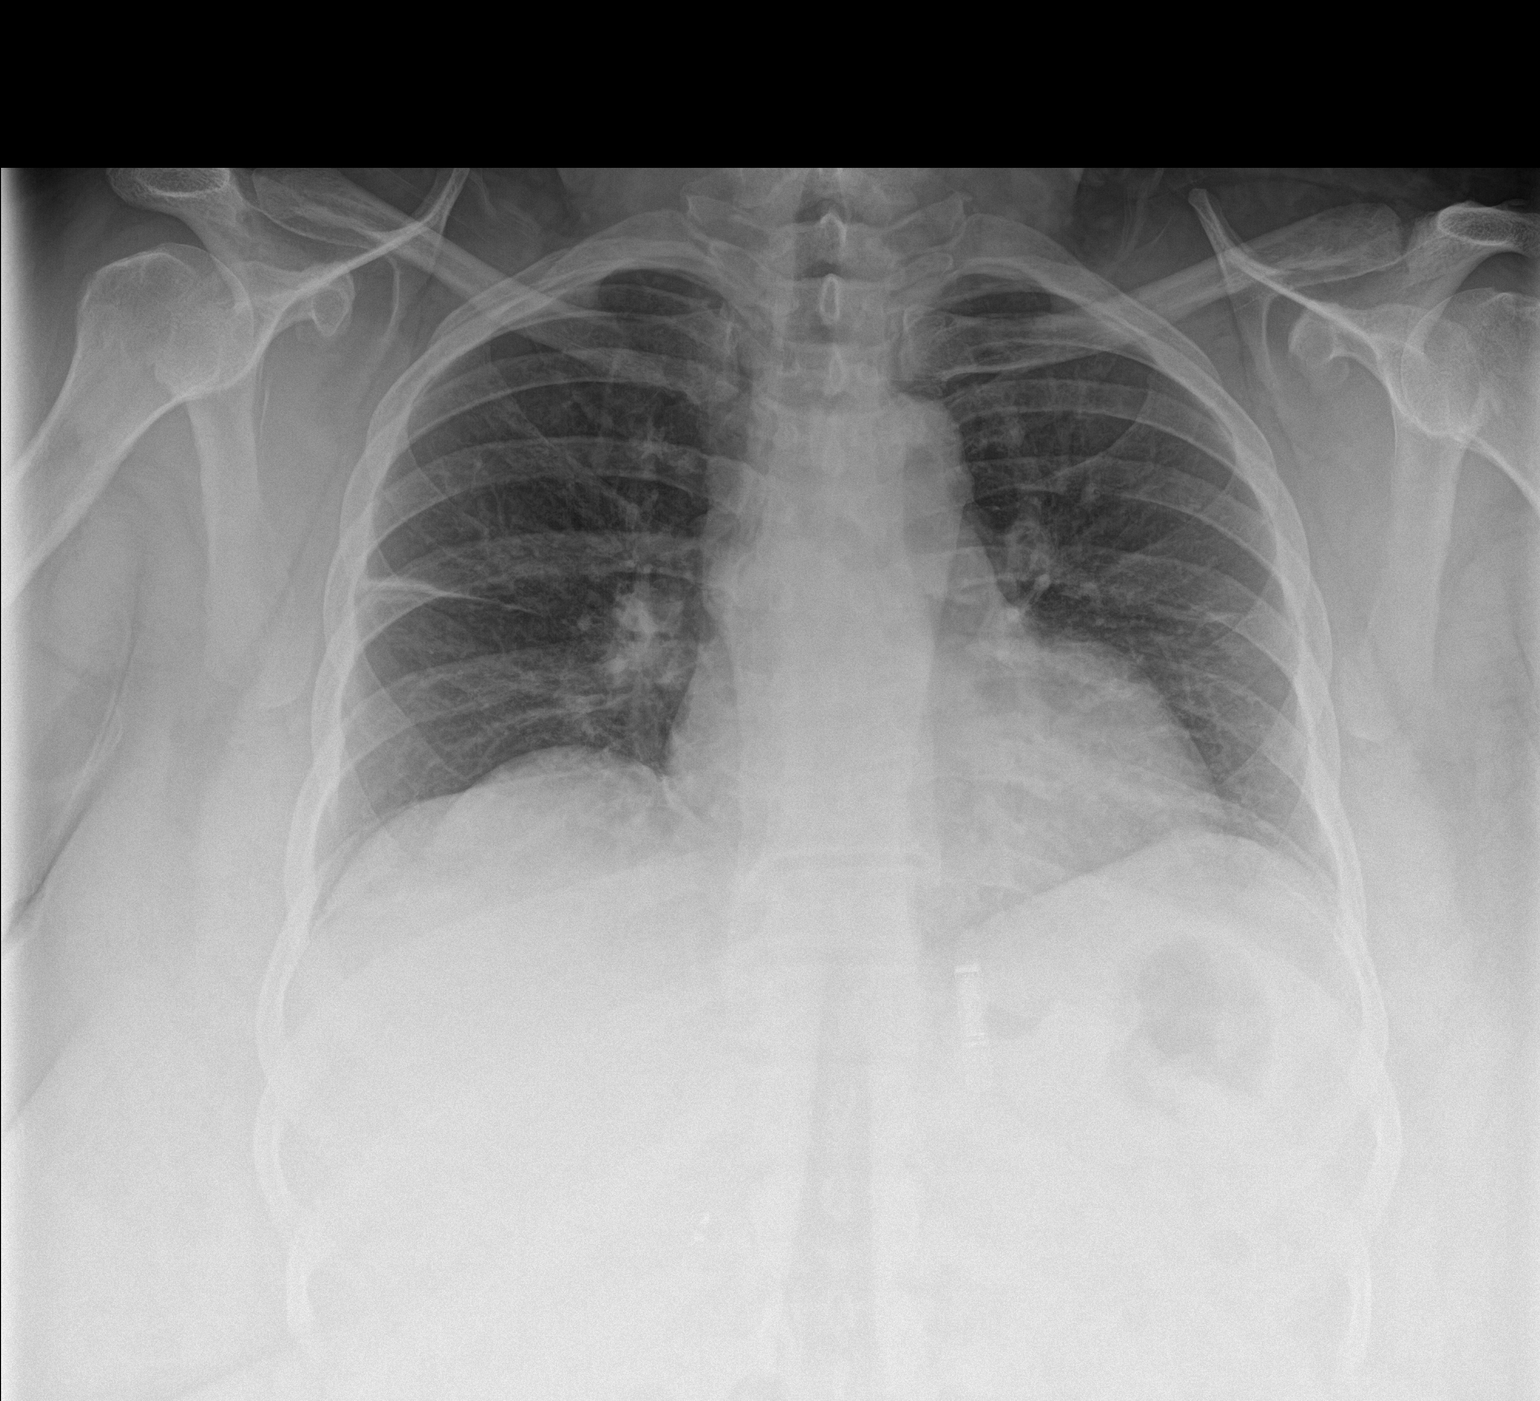
[im 2/2]
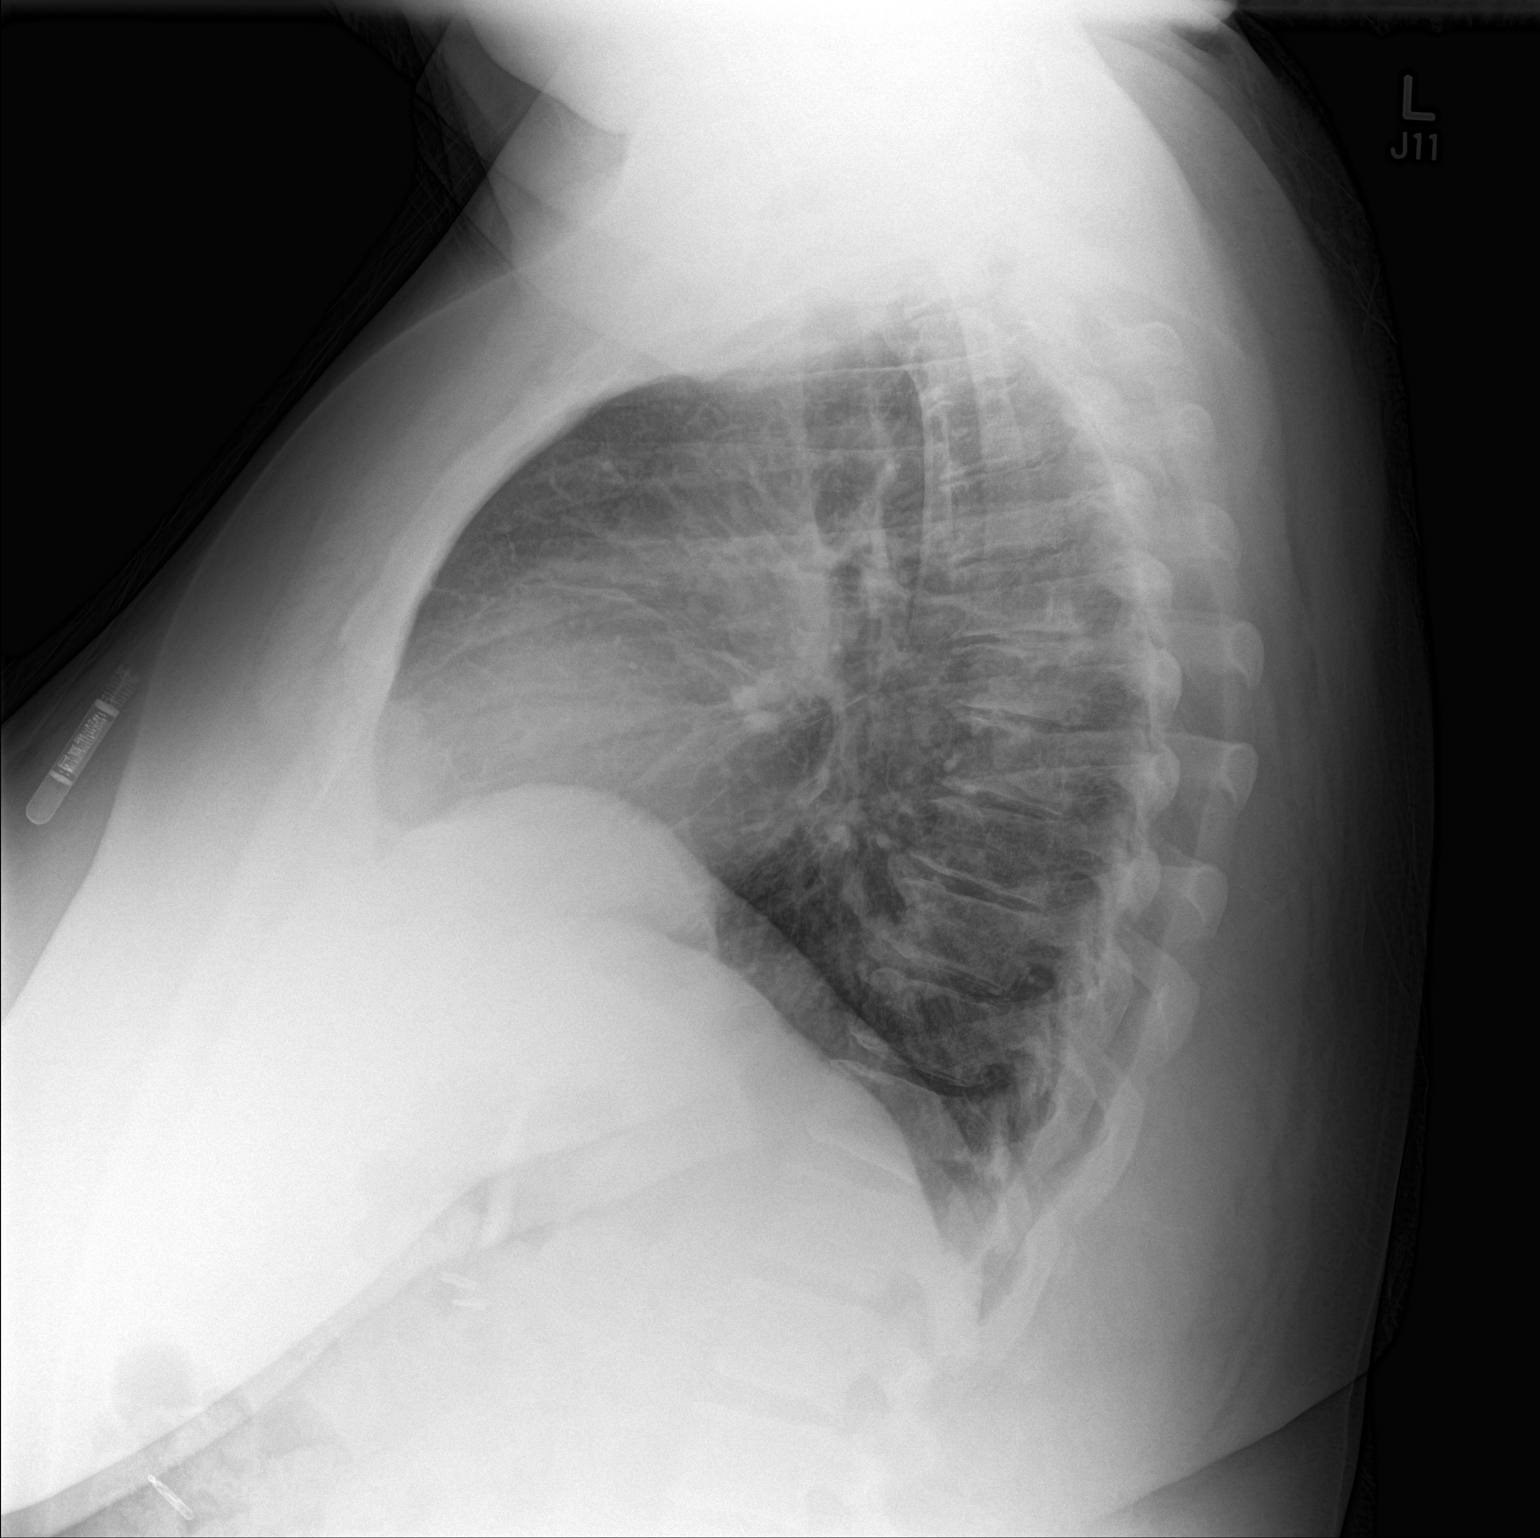

[2 of 2 positions shown; findings below may reference images not displayed]

FINDINGS: Cardiomediastinal silhouette is stable. Linear atelectasis or
scarring in right midlung peripheral is stable. No infiltrate or
pleural effusion. No pulmonary edema. Mild degenerative changes mid
and lower thoracic spine.
IMPRESSION: No active cardiopulmonary disease. Mild degenerative changes
thoracic spine.

## 2016-03-28 ENCOUNTER — Ambulatory Visit (INDEPENDENT_AMBULATORY_CARE_PROVIDER_SITE_OTHER): Payer: Medicare Other | Admitting: Cardiology

## 2016-03-28 ENCOUNTER — Encounter: Payer: Self-pay | Admitting: Cardiology

## 2016-03-28 VITALS — BP 144/80 | HR 74 | Ht 65.0 in | Wt 294.8 lb

## 2016-03-28 DIAGNOSIS — I63412 Cerebral infarction due to embolism of left middle cerebral artery: Secondary | ICD-10-CM

## 2016-03-28 DIAGNOSIS — I10 Essential (primary) hypertension: Secondary | ICD-10-CM

## 2016-03-28 MED ORDER — METOPROLOL TARTRATE 25 MG PO TABS: 25.0000 mg | ORAL_TABLET | Freq: Two times a day (BID) | ORAL | Status: DC

## 2016-03-28 MED ORDER — AMLODIPINE BESYLATE 10 MG PO TABS
10.0000 mg | ORAL_TABLET | Freq: Every day | ORAL | Status: DC
Start: 2016-03-28 — End: 2016-03-28

## 2016-03-28 MED ORDER — AMLODIPINE BESYLATE 10 MG PO TABS: 10.0000 mg | ORAL_TABLET | Freq: Every day | ORAL | Status: DC

## 2016-03-28 NOTE — Addendum Note (Signed)
Addended by: Lamar Laundry on: 03/28/2016 03:20 PM   Modules accepted: Orders

## 2016-03-28 NOTE — Progress Notes (Signed)
03/28/2016 Mary [name]   December 15, 1966  [id number]  Primary Physician Dawn Lazarus Gowda, MD Primary Cardiologist/EP Dr. Rayann Heman   Reason for Visit/CC: Surgical Clearance; Non cardiac surgery (Bariatric)  HPI:  50 y/o morbidly obese AAF presenting to clinic for surgical clearance. She is scheduled to undergo bariatric surgery for weight loss. She has been seen in the past by Dr. Rayann Heman. Last OV was in 2015. She has a h/o hypertension and cryptogenic stroke in 01/2014. Work up at that time was unrevealing. Subsequently, she was referred to Dr. Rayann Heman for consideration for ILR to evaluate for atrial arrhthymias as a cause of her CVA. Her device was last interrogated remotely 02/29/16. This showed no atrial fibrillation/flutter.  She is on chronic Coumadin. Her INR is followed in our [place] office. She also has a remote h/o DVT.   She reports that she has done well. No palpitations. No recurrent stoke like symptoms. She has been fully compliant with Coumadin. She denies anginal symptomatology. No chest pain or dyspnea. She is able to walk up a hill and climb a flight of stairs w/o exertional CP or dyspnea. EKG today shows NSR w/o ischemia. HR and BP are both stable.    Current Outpatient Prescriptions  Medication Sig Dispense Refill  . amLODipine (NORVASC) 10 MG tablet Take 1 tablet (10 mg total) by mouth daily. 90 tablet 3  . metFORMIN (GLUCOPHAGE) 500 MG tablet Take 1 tablet (500 mg total) by mouth 2 (two) times daily with a meal. 180 tablet 3  . metoprolol tartrate (LOPRESSOR) 25 MG tablet TAKE 1 TABLET BY MOUTH 2 TIMES DAILY. 180 tablet 0  . warfarin (COUMADIN) 5 MG tablet Take as directed by Coumadin Clinic (Patient taking differently: Take 5 mg by mouth at bedtime. Take as directed by Coumadin Clinic) 90 tablet 1   No current facility-administered medications for this visit.    No Known Allergies  Social History   Social History  . Marital Status: Single    Spouse Name: N/A  .  Number of Children: 2  . Years of Education: 13   Occupational History  . Not on file.   Social History Main Topics  . Smoking status: Never Smoker   . Smokeless tobacco: Never Used  . Alcohol Use: No  . Drug Use: No  . Sexual Activity: Yes   Other Topics Concern  . Not on file   Social History Narrative   Patient is single with two children.   Patient is right handed.   Patient has 13 yrs of education.   Patient drinks 5 sodas daily.     Review of Systems: General: negative for chills, fever, night sweats or weight changes.  Cardiovascular: negative for chest pain, dyspnea on exertion, edema, orthopnea, palpitations, paroxysmal nocturnal dyspnea or shortness of breath Dermatological: negative for rash Respiratory: negative for cough or wheezing Urologic: negative for hematuria Abdominal: negative for nausea, vomiting, diarrhea, bright red blood per rectum, melena, or hematemesis Neurologic: negative for visual changes, syncope, or dizziness All other systems reviewed and are otherwise negative except as noted above.    Blood pressure 144/80, pulse 74, height 5\' 5"  (1.651 m), weight 294 lb 12.8 oz (133.72 kg).  General appearance: alert, cooperative, no distress and moderately obese Neck: no carotid bruit and no JVD Lungs: clear to auscultation bilaterally Heart: regular rate and rhythm, S1, S2 normal, no murmur, click, rub or gallop Extremities: no LEE Pulses: 2+ and symmetric Skin: warm and dry Neurologic: Grossly normal  EKG NSR. No ischemia  ASSESSMENT AND PLAN:    1. H/o Cryptogenic Stroke: thus far, there has been no documentation of atrial fib/flutter on loop recorder. This was last interrogated 02/29/16. She is however on chronic coumadin therapy. If and when patient undergoes surgery, she will need to be bridged with Lovenox while Coumadin is being held, given her prior CVA as well as h/o DVT. We will arrange appt with pharmacy to further discuss/  arrange.  2. Surgical Clearance: patient has no documented h/o cardiac disease. No h/o CAD. She denies anginal symptomatology. No exertional CP or dyspnea. EKG shows NSR w/o ischemia. There is no indication for pre-operative cardiac testing. She has been cleared for surgery. She is considered low risk.   3. H/o DVT: as outlined above, she will need to be bridged with Lovenox while Coumadin is being held for surgery. Will arrange f/u in our Coumadin clinic.   PLAN  F/u as needed.   Bowen Kia PA-C 03/28/2016 3:12 PM

## 2016-03-28 NOTE — Patient Instructions (Signed)
Medication Instructions:  Your physician recommends that you continue on your current medications as directed. Please refer to the Current Medication list given to you today.   Labwork: None ordered  Testing/Procedures: None ordered  Follow-Up: Follow up with Cardiology as needed  Any Other Special Instructions Will Be Listed Below (If Applicable). From a cardiac standpoint you are cleared for bariatric surgery. You would be considered low risk.  Please follow up with the Coumadin Clinic as scheduled. Let them know your surgery is scheduled.    If you need a refill on your cardiac medications before your next appointment, please call your pharmacy.

## 2016-03-30 ENCOUNTER — Ambulatory Visit (INDEPENDENT_AMBULATORY_CARE_PROVIDER_SITE_OTHER): Payer: Medicare Other

## 2016-03-30 DIAGNOSIS — I639 Cerebral infarction, unspecified: Secondary | ICD-10-CM

## 2016-03-30 DIAGNOSIS — Z5181 Encounter for therapeutic drug level monitoring: Secondary | ICD-10-CM

## 2016-03-30 LAB — POCT INR: INR: 3.1

## 2016-03-30 NOTE — Addendum Note (Signed)
Addended by: Freada Bergeron on: 03/30/2016 11:40 AM   Modules accepted: Orders

## 2016-04-13 DIAGNOSIS — I10 Essential (primary) hypertension: Secondary | ICD-10-CM | POA: Diagnosis not present

## 2016-04-13 DIAGNOSIS — E119 Type 2 diabetes mellitus without complications: Secondary | ICD-10-CM | POA: Diagnosis not present

## 2016-04-18 ENCOUNTER — Ambulatory Visit (INDEPENDENT_AMBULATORY_CARE_PROVIDER_SITE_OTHER): Payer: Medicare Other | Admitting: *Deleted

## 2016-04-18 DIAGNOSIS — I639 Cerebral infarction, unspecified: Secondary | ICD-10-CM

## 2016-04-18 NOTE — Progress Notes (Signed)
Carelink Summary Report / Loop Recorder 

## 2016-05-05 ENCOUNTER — Other Ambulatory Visit: Payer: Medicare Other

## 2016-05-09 ENCOUNTER — Encounter: Payer: Self-pay | Admitting: Internal Medicine

## 2016-05-09 ENCOUNTER — Ambulatory Visit (HOSPITAL_BASED_OUTPATIENT_CLINIC_OR_DEPARTMENT_OTHER): Payer: Medicare Other

## 2016-05-09 ENCOUNTER — Ambulatory Visit: Payer: Medicare Other | Attending: Internal Medicine | Admitting: Internal Medicine

## 2016-05-09 ENCOUNTER — Other Ambulatory Visit: Payer: Self-pay | Admitting: Internal Medicine

## 2016-05-09 DIAGNOSIS — I63412 Cerebral infarction due to embolism of left middle cerebral artery: Secondary | ICD-10-CM | POA: Diagnosis not present

## 2016-05-09 DIAGNOSIS — Z7189 Other specified counseling: Secondary | ICD-10-CM

## 2016-05-09 DIAGNOSIS — Z5181 Encounter for therapeutic drug level monitoring: Secondary | ICD-10-CM

## 2016-05-09 DIAGNOSIS — I639 Cerebral infarction, unspecified: Secondary | ICD-10-CM | POA: Diagnosis not present

## 2016-05-09 DIAGNOSIS — R791 Abnormal coagulation profile: Secondary | ICD-10-CM | POA: Diagnosis not present

## 2016-05-09 DIAGNOSIS — R739 Hyperglycemia, unspecified: Secondary | ICD-10-CM

## 2016-05-09 DIAGNOSIS — Z1231 Encounter for screening mammogram for malignant neoplasm of breast: Secondary | ICD-10-CM

## 2016-05-09 DIAGNOSIS — I10 Essential (primary) hypertension: Secondary | ICD-10-CM

## 2016-05-09 DIAGNOSIS — R7989 Other specified abnormal findings of blood chemistry: Secondary | ICD-10-CM

## 2016-05-09 DIAGNOSIS — Z1239 Encounter for other screening for malignant neoplasm of breast: Secondary | ICD-10-CM

## 2016-05-09 DIAGNOSIS — D649 Anemia, unspecified: Secondary | ICD-10-CM

## 2016-05-09 DIAGNOSIS — K649 Unspecified hemorrhoids: Secondary | ICD-10-CM | POA: Diagnosis not present

## 2016-05-09 DIAGNOSIS — E538 Deficiency of other specified B group vitamins: Secondary | ICD-10-CM | POA: Diagnosis not present

## 2016-05-09 DIAGNOSIS — R7309 Other abnormal glucose: Secondary | ICD-10-CM | POA: Diagnosis not present

## 2016-05-09 LAB — GLUCOSE, POCT (MANUAL RESULT ENTRY): POC Glucose: 96 mg/dl (ref 70–99)

## 2016-05-09 LAB — POCT INR: INR: 4.7

## 2016-05-09 MED ORDER — HYDROCHLOROTHIAZIDE 25 MG PO TABS: 25.0000 mg | ORAL_TABLET | Freq: Every day | ORAL | Status: DC

## 2016-05-09 MED ORDER — AMLODIPINE BESYLATE 10 MG PO TABS: 10.0000 mg | ORAL_TABLET | Freq: Every day | ORAL | Status: DC

## 2016-05-09 MED FILL — METOPROLOL TARTRATE 25 MG T: 25 | 30 days supply | Qty: 60 | Fill #0

## 2016-05-09 MED FILL — AMLODIPINE BESYLATE 10 MG T: 10 | 30 days supply | Qty: 30 | Fill #0

## 2016-05-09 NOTE — Progress Notes (Addendum)
Patient's blood sugar results were put in the system by Kim B. Curse per Gillis Santa.

## 2016-05-09 NOTE — Progress Notes (Signed)
Pt here for F/U. Pt denies any pain today. CBG is 96.

## 2016-05-09 NOTE — Addendum Note (Signed)
Addended by: Wonda Olds L on: 05/09/2016 12:58 PM   Modules accepted: Orders

## 2016-05-09 NOTE — Progress Notes (Signed)
Mary [name], is a 50 y.o. female  [id number]  [id number]  DOB - 02/12/1966  Chief Complaint  Patient presents with  . Follow-up        Subjective:   Mary [name] is a 50 y.o. female here today for a follow up visit for htn and papsmear. Pt says she is doing well, about 3 months away from her Bariatric surgery in August.  Brought a list of labs she need to have done today as well. Pt denies any acute complaints, but mentions hemorrhoidal bleeding again recently. +straining at times on commode.  Denies smoking or drinking.  Her norvasc is expensive at outside pharmacy and requested to switch to ours.   Patient has No headache, No chest pain, No abdominal pain - No Nausea, No new weakness tingling or numbness, No Cough - SOB.  No problems updated.  ALLERGIES: No Known Allergies  PAST MEDICAL HISTORY: Past Medical History  Diagnosis Date  . Hypertension   . Other and unspecified ovarian cysts   . Stroke ([place])   . Diabetes mellitus without complication ([place])   . Blood transfusion without reported diagnosis     transfusion December 2016    MEDICATIONS AT HOME: Prior to Admission medications   Medication Sig Start Date End Date Taking? Authorizing Provider  amLODipine (NORVASC) 10 MG tablet Take 1 tablet (10 mg total) by mouth daily. 05/09/16  Yes Maren Reamer, MD  metFORMIN (GLUCOPHAGE) 500 MG tablet Take 1 tablet (500 mg total) by mouth 2 (two) times daily with a meal. 07/23/15  Yes Lorayne Marek, MD  metoprolol tartrate (LOPRESSOR) 25 MG tablet Take 1 tablet (25 mg total) by mouth 2 (two) times daily. 03/28/16  Yes Brittainy Erie Noe, PA-C  warfarin (COUMADIN) 5 MG tablet Take as directed by [facility] Patient taking differently: Take 5 mg by mouth at bedtime. Take as directed by [facility] 10/14/15  Yes Thompson Grayer, MD  hydrochlorothiazide (HYDRODIURIL) 25 MG tablet Take 1 tablet (25 mg total) by mouth daily. 05/09/16   Maren Reamer, MD      Objective:   Filed Vitals:   05/09/16 1109  BP: 158/97  Pulse: 69  Temp: 97.5 F (36.4 C)  TempSrc: Oral  Resp: 14  Height: 5\' 5"  (1.651 m)  Weight: 296 lb 3.2 oz (134.355 kg)  SpO2: 96%    Exam General appearance : Awake, alert, not in any distress. Speech Clear. Not toxic looking, extreme morbid obesity. HEENT: Atraumatic and Normocephalic, pupils equally reactive to light. Neck: supple, no JVD. No cervical lymphadenopathy.  Chest:Good air entry bilaterally, no added sounds. Bilateral breast exam: large breast, no palpable masses (but difficult exam 2nd to body habitus). Skin tag below left breast. No axilla lad. CVS: S1 S2 regular, no murmurs/gallups or rubs. Abdomen: Bowel sounds active, obese, Non tender and not distended with no gaurding, rigidity or rebound. Extremities: B/L Lower Ext shows no edema, both legs are warm to touch Neurology: Awake alert, and oriented X 3, CN II-XII grossly intact, Non focal Skin:No Rash  Pap smear - stopped before could start b/c patient started having left le extremity leg cramps. Pt requested to come back later to have done.  Data Review Lab Results  Component Value Date   HGBA1C 5.5 02/29/2016   HGBA1C 6.6* 07/22/2015   HGBA1C 6.3* 10/07/2014    Depression screen PHQ 2/9 05/09/2016 02/29/2016 12/16/2014 12/16/2014  Decreased Interest 2 0 0 0  Down, Depressed, Hopeless 0 0 0 0  PHQ - 2 Score 2 0 0 0  Altered sleeping 0 - - -  Tired, decreased energy 2 - - -  Change in appetite 0 - - -  Feeling bad or failure about yourself  0 - - -  Trouble concentrating 0 - - -  Moving slowly or fidgety/restless 0 - - -  Suicidal thoughts 0 - - -  PHQ-9 Score 4 - - -      Assessment & Plan   1. Morbid obesity, unspecified obesity type ([place]) bmi 49 Patient pending bariatric surgery and has numerous labs today to be done for bariatric preop assessment  2. Essential hypertension - Elevated blood pressures today. Not certain if  patient is taking her Norvasc or not given. She said it is expensive at her other pharmacy and requested prescriptions here at our pharmacy. We will start her on HCTZ 25mg  po qday,  Renewed her Norvasc 10, and continue metoprolol 25bid.  - amLODipine (NORVASC) 10 MG tablet; Take 1 tablet (10 mg total) by mouth daily.  Dispense: 90 tablet; Refill: 3  3. Anemia, unspecified - Iron, TIBC and Ferritin Panel - CBC With Differential - PT AND PTT  4. Bleeding hemorrhoid High-fiber diet recommended with patient, more vegetables and drinks and bulking agents to help with regular bowel movements and prevent constipation.   5. Encounter for pre-bariatric surgery counseling and education - CMP and Liver - Magnesium - Prealbumin - Vitamin B1 - H. pylori breath test - Vitamin A - Vitamin E - Vitamin K1, Serum - Zinc - Copper, Serum - PTH, Intact and Calcium - TSH - folate  6. Hyperglycemia - Hemoglobin A1c - dm screening  7. Abnormal TSH - chk tsh, part of predm surgery screening.   8. Breast cancer screening Mm ordered, last done in 02/2014 [facility] Digital Screening; Future   9. Health Maintenance - due for pap smear, prepped and about to have done today, but than pt started c/o of left sided le leg cramps.  She walked it off a few times and got better, but kept interfering w/ exam, so Pt asked to reschedule it.    Patient have been counseled extensively about nutrition and exercise  Return in about 2 weeks (around 05/23/2016) for pap smear.  The patient was given clear instructions to go to ER or return to medical center if symptoms don't improve, worsen or new problems develop. The patient verbalized understanding. The patient was told to call to get lab results if they haven't heard anything in the next week.    Maren Reamer, MD, [facility], [place]   05/09/2016, 11:47 AM

## 2016-05-10 LAB — CBC WITH DIFFERENTIAL/PLATELET
BASOS ABS: 50 {cells}/uL (ref 0–200)
Basophils Relative: 1 %
EOS ABS: 150 {cells}/uL (ref 15–500)
Eosinophils Relative: 3 %
HEMATOCRIT: 33.3 % — AB (ref 35.0–45.0)
HEMOGLOBIN: 10.4 g/dL — AB (ref 11.7–15.5)
LYMPHS PCT: 39 %
Lymphs Abs: 1950 cells/uL (ref 850–3900)
MCH: 20 pg — AB (ref 27.0–33.0)
MCHC: 31.2 g/dL — AB (ref 32.0–36.0)
MCV: 64 fL — AB (ref 80.0–100.0)
MONO ABS: 200 {cells}/uL (ref 200–950)
Monocytes Relative: 4 %
Neutro Abs: 2650 cells/uL (ref 1500–7800)
Neutrophils Relative %: 53 %
Platelets: 237 10*3/uL (ref 140–400)
RBC: 5.2 MIL/uL — ABNORMAL HIGH (ref 3.80–5.10)
RDW: 19.2 % — AB (ref 11.0–15.0)
WBC: 5 10*3/uL (ref 3.8–10.8)

## 2016-05-10 LAB — IRON,TIBC AND FERRITIN PANEL
%SAT: 4 % — ABNORMAL LOW (ref 11–50)
FERRITIN: 9 ng/mL — AB (ref 10–232)
IRON: 16 ug/dL — AB (ref 45–160)
TIBC: 367 ug/dL (ref 250–450)

## 2016-05-10 LAB — CMP AND LIVER
ALT: 14 U/L (ref 6–29)
AST: 19 U/L (ref 10–35)
Albumin: 3.7 g/dL (ref 3.6–5.1)
Alkaline Phosphatase: 58 U/L (ref 33–130)
BILIRUBIN INDIRECT: 0.2 mg/dL (ref 0.2–1.2)
BUN: 8 mg/dL (ref 7–25)
Bilirubin, Direct: 0.1 mg/dL (ref <=0.2)
CO2: 25 mmol/L (ref 20–31)
Calcium: 8 mg/dL — ABNORMAL LOW (ref 8.6–10.4)
Chloride: 101 mmol/L (ref 98–110)
Creat: 0.71 mg/dL (ref 0.50–1.05)
GLUCOSE: 84 mg/dL (ref 65–99)
Potassium: 3.5 mmol/L (ref 3.5–5.3)
SODIUM: 143 mmol/L (ref 135–146)
TOTAL PROTEIN: 7.6 g/dL (ref 6.1–8.1)
Total Bilirubin: 0.3 mg/dL (ref 0.2–1.2)

## 2016-05-10 LAB — CP4508-PT/INR AND PTT
INR: 3.6 — ABNORMAL HIGH (ref <1.50)
PROTHROMBIN TIME: 36.5 s — AB (ref 11.6–15.2)
aPTT: 88 seconds — ABNORMAL HIGH (ref 24–37)

## 2016-05-10 LAB — HEMOGLOBIN A1C
Hgb A1c MFr Bld: 6.5 % — ABNORMAL HIGH (ref <5.7)
MEAN PLASMA GLUCOSE: 140 mg/dL

## 2016-05-10 LAB — PREALBUMIN: PREALBUMIN: 21 mg/dL (ref 17–34)

## 2016-05-10 LAB — H. PYLORI BREATH TEST: H. pylori Breath Test: NOT DETECTED

## 2016-05-10 LAB — MAGNESIUM: MAGNESIUM: 1.8 mg/dL (ref 1.5–2.5)

## 2016-05-10 LAB — FOLATE: FOLATE: 11.9 ng/mL (ref >5.4)

## 2016-05-10 LAB — TSH: TSH: 1.16 mIU/L

## 2016-05-11 ENCOUNTER — Ambulatory Visit (INDEPENDENT_AMBULATORY_CARE_PROVIDER_SITE_OTHER): Payer: Medicare Other | Admitting: *Deleted

## 2016-05-11 DIAGNOSIS — E119 Type 2 diabetes mellitus without complications: Secondary | ICD-10-CM | POA: Diagnosis not present

## 2016-05-11 DIAGNOSIS — I639 Cerebral infarction, unspecified: Secondary | ICD-10-CM | POA: Diagnosis not present

## 2016-05-11 DIAGNOSIS — Z5181 Encounter for therapeutic drug level monitoring: Secondary | ICD-10-CM

## 2016-05-11 DIAGNOSIS — I10 Essential (primary) hypertension: Secondary | ICD-10-CM | POA: Diagnosis not present

## 2016-05-11 LAB — POCT INR: INR: 3.6

## 2016-05-11 LAB — PTH, INTACT AND CALCIUM
CALCIUM: 8.5 mg/dL (ref 8.4–10.5)
PTH: 111 pg/mL — AB (ref 14–64)

## 2016-05-12 DIAGNOSIS — F4323 Adjustment disorder with mixed anxiety and depressed mood: Secondary | ICD-10-CM | POA: Diagnosis not present

## 2016-05-12 LAB — VITAMIN A

## 2016-05-14 LAB — CUP PACEART REMOTE DEVICE CHECK: MDC IDC SESS DTM: 20170331010800

## 2016-05-14 NOTE — Progress Notes (Signed)
Carelink summary report received. Battery status OK. Normal device function. No new symptom episodes, tachy episodes, brady, or pause episodes. 1 AF episode, SR w PAc's. Monthly summary reports and ROV/PRN

## 2016-05-15 LAB — VITAMIN B1: Vitamin B1 (Thiamine): <7 nmol/L — ABNORMAL LOW (ref 8–30)

## 2016-05-15 LAB — VITAMIN K1, SERUM: Vitamin K: 300 pg/mL (ref 80–1160)

## 2016-05-17 ENCOUNTER — Ambulatory Visit (INDEPENDENT_AMBULATORY_CARE_PROVIDER_SITE_OTHER): Payer: Medicare Other | Admitting: *Deleted

## 2016-05-17 DIAGNOSIS — I639 Cerebral infarction, unspecified: Secondary | ICD-10-CM | POA: Diagnosis not present

## 2016-05-18 LAB — ZINC: Zinc: 64 ug/dL (ref 60–130)

## 2016-05-18 NOTE — Progress Notes (Signed)
Carelink Summary Report / Loop Recorder 

## 2016-05-23 ENCOUNTER — Ambulatory Visit: Payer: Medicare Other

## 2016-05-23 ENCOUNTER — Other Ambulatory Visit: Payer: Medicare Other | Admitting: Internal Medicine

## 2016-05-25 ENCOUNTER — Ambulatory Visit (INDEPENDENT_AMBULATORY_CARE_PROVIDER_SITE_OTHER): Payer: Medicare Other

## 2016-05-25 DIAGNOSIS — I639 Cerebral infarction, unspecified: Secondary | ICD-10-CM

## 2016-05-25 DIAGNOSIS — Z5181 Encounter for therapeutic drug level monitoring: Secondary | ICD-10-CM | POA: Diagnosis not present

## 2016-05-25 LAB — POCT INR: INR: 2.2

## 2016-05-28 LAB — CUP PACEART REMOTE DEVICE CHECK: Date Time Interrogation Session: 20170430013606

## 2016-05-28 NOTE — Progress Notes (Signed)
Carelink summary report received. Battery status OK. Normal device function. No new symptom episodes, tachy episodes, brady, or pause episodes. No new AF episodes. Monthly summary reports and ROV/PRN 

## 2016-06-15 ENCOUNTER — Ambulatory Visit (INDEPENDENT_AMBULATORY_CARE_PROVIDER_SITE_OTHER): Payer: Medicare Other | Admitting: *Deleted

## 2016-06-15 ENCOUNTER — Other Ambulatory Visit: Payer: Self-pay | Admitting: Internal Medicine

## 2016-06-15 DIAGNOSIS — I639 Cerebral infarction, unspecified: Secondary | ICD-10-CM

## 2016-06-15 DIAGNOSIS — Z5181 Encounter for therapeutic drug level monitoring: Secondary | ICD-10-CM

## 2016-06-15 LAB — POCT INR: INR: 2.8

## 2016-06-16 NOTE — Progress Notes (Signed)
Carelink Summary Report / Loop Recorder 

## 2016-06-24 LAB — CUP PACEART REMOTE DEVICE CHECK: MDC IDC SESS DTM: 20170530020604

## 2016-06-27 ENCOUNTER — Ambulatory Visit: Payer: Medicare Other | Attending: Internal Medicine | Admitting: Internal Medicine

## 2016-06-27 ENCOUNTER — Encounter: Payer: Self-pay | Admitting: Internal Medicine

## 2016-06-27 VITALS — BP 130/84 | HR 71 | Temp 98.4°F | Resp 16 | Wt 291.6 lb

## 2016-06-27 DIAGNOSIS — D509 Iron deficiency anemia, unspecified: Secondary | ICD-10-CM | POA: Diagnosis not present

## 2016-06-27 DIAGNOSIS — I63412 Cerebral infarction due to embolism of left middle cerebral artery: Secondary | ICD-10-CM | POA: Diagnosis not present

## 2016-06-27 DIAGNOSIS — Z1231 Encounter for screening mammogram for malignant neoplasm of breast: Secondary | ICD-10-CM

## 2016-06-27 DIAGNOSIS — I639 Cerebral infarction, unspecified: Secondary | ICD-10-CM | POA: Diagnosis not present

## 2016-06-27 DIAGNOSIS — R739 Hyperglycemia, unspecified: Secondary | ICD-10-CM

## 2016-06-27 DIAGNOSIS — I10 Essential (primary) hypertension: Secondary | ICD-10-CM

## 2016-06-27 MED ORDER — FERROUS GLUCONATE 324 (38 FE) MG PO TABS: 324.0000 mg | ORAL_TABLET | Freq: Every day | ORAL | Status: DC

## 2016-06-27 MED FILL — AMLODIPINE BESYLATE 10 MG T: 10 | 30 days supply | Qty: 30 | Fill #1

## 2016-06-27 NOTE — Patient Instructions (Signed)
Low-Sodium Eating Plan Sodium raises blood pressure and causes water to be held in the body. Getting less sodium from food will help lower your blood pressure, reduce any swelling, and protect your heart, liver, and kidneys. We get sodium by adding salt (sodium chloride) to food. Most of our sodium comes from canned, boxed, and frozen foods. Restaurant foods, fast foods, and pizza are also very high in sodium. Even if you take medicine to lower your blood pressure or to reduce fluid in your body, getting less sodium from your food is important. WHAT IS MY PLAN? Most people should limit their sodium intake to 2,300 mg a day. Your health care provider recommends that you limit your sodium intake to __________ a day.  WHAT DO I NEED TO KNOW ABOUT THIS EATING PLAN? For the low-sodium eating plan, you will follow these general guidelines:  Choose foods with a % Daily Value for sodium of less than 5% (as listed on the food label).   Use salt-free seasonings or herbs instead of table salt or sea salt.   Check with your health care provider or pharmacist before using salt substitutes.   Eat fresh foods.  Eat more vegetables and fruits.  Limit canned vegetables. If you do use them, rinse them well to decrease the sodium.   Limit cheese to 1 oz (28 g) per day.   Eat lower-sodium products, often labeled as "lower sodium" or "no salt added."  Avoid foods that contain monosodium glutamate (MSG). MSG is sometimes added to [place] food and some canned foods.  Check food labels (Nutrition Facts labels) on foods to learn how much sodium is in one serving.  Eat more home-cooked food and less restaurant, buffet, and fast food.  When eating at a restaurant, ask that your food be prepared with less salt, or no salt if possible.  HOW DO I READ FOOD LABELS FOR SODIUM INFORMATION? The Nutrition Facts label lists the amount of sodium in one serving of the food. If you eat more than one serving, you  must multiply the listed amount of sodium by the number of servings. Food labels may also identify foods as:  Sodium free--Less than 5 mg in a serving.  Very low sodium--35 mg or less in a serving.  Low sodium--140 mg or less in a serving.  Light in sodium--50% less sodium in a serving. For example, if a food that usually has 300 mg of sodium is changed to become light in sodium, it will have 150 mg of sodium.  Reduced sodium--25% less sodium in a serving. For example, if a food that usually has 400 mg of sodium is changed to reduced sodium, it will have 300 mg of sodium. WHAT FOODS CAN I EAT? Grains Low-sodium cereals, including oats, puffed wheat and rice, and shredded wheat cereals. Low-sodium crackers. Unsalted rice and pasta. Lower-sodium bread.  Vegetables Frozen or fresh vegetables. Low-sodium or reduced-sodium canned vegetables. Low-sodium or reduced-sodium tomato sauce and paste. Low-sodium or reduced-sodium tomato and vegetable juices.  Fruits Fresh, frozen, and canned fruit. Fruit juice.  Meat and Other Protein Products Low-sodium canned tuna and salmon. Fresh or frozen meat, poultry, seafood, and fish. Lamb. Unsalted nuts. Dried beans, peas, and lentils without added salt. Unsalted canned beans. Homemade soups without salt. Eggs.  Dairy Milk. Soy milk. Ricotta cheese. Low-sodium or reduced-sodium cheeses. Yogurt.  Condiments Fresh and dried herbs and spices. Salt-free seasonings. Onion and garlic powders. Low-sodium varieties of mustard and ketchup. Fresh or refrigerated horseradish. [place]  juice.  Fats and Oils Reduced-sodium salad dressings. Unsalted butter.  Other Unsalted popcorn and pretzels.  The items listed above may not be a complete list of recommended foods or beverages. Contact your dietitian for more options. WHAT FOODS ARE NOT RECOMMENDED? Grains Instant hot cereals. Bread stuffing, pancake, and biscuit mixes. Croutons. Seasoned rice or pasta mixes.  Noodle soup cups. Boxed or frozen macaroni and cheese. Self-rising flour. Regular salted crackers. Vegetables Regular canned vegetables. Regular canned tomato sauce and paste. Regular tomato and vegetable juices. Frozen vegetables in sauces. Salted [place] fries. Olives. Angie Fava. Relishes. Sauerkraut. Salsa. Meat and Other Protein Products Salted, canned, smoked, spiced, or pickled meats, seafood, or fish. Bacon, ham, sausage, hot dogs, corned beef, chipped beef, and packaged luncheon meats. Salt pork. Jerky. Pickled herring. Anchovies, regular canned tuna, and sardines. Salted nuts. Dairy Processed cheese and cheese spreads. Cheese curds. Blue cheese and cottage cheese. Buttermilk.  Condiments Onion and garlic salt, seasoned salt, table salt, and sea salt. Canned and packaged gravies. Worcestershire sauce. Tartar sauce. Barbecue sauce. Teriyaki sauce. Soy sauce, including reduced sodium. Steak sauce. Fish sauce. Oyster sauce. Cocktail sauce. Horseradish that you find on the shelf. Regular ketchup and mustard. Meat flavorings and tenderizers. Bouillon cubes. Hot sauce. Tabasco sauce. Marinades. Taco seasonings. Relishes. Fats and Oils Regular salad dressings. Salted butter. Margarine. Ghee. Bacon fat.  Other Potato and tortilla chips. Corn chips and puffs. Salted popcorn and pretzels. Canned or dried soups. Pizza. Frozen entrees and pot pies.  The items listed above may not be a complete list of foods and beverages to avoid. Contact your dietitian for more information.   This information is not intended to replace advice given to you by your health care provider. Make sure you discuss any questions you have with your health care provider.   Document Released: 05/27/2002 Document Revised: 12/26/2014 Document Reviewed: 10/09/2013 Elsevier Interactive Patient Education 2016 [place] for [employer] Calories are energy you get from the things you eat and drink. Your  body uses this energy to keep you going throughout the day. The number of calories you eat affects your weight. When you eat more calories than your body needs, your body stores the extra calories as fat. When you eat fewer calories than your body needs, your body burns fat to get the energy it needs. Calorie counting means keeping track of how many calories you eat and drink each day. If you make sure to eat fewer calories than your body needs, you should lose weight. In order for calorie counting to work, you will need to eat the number of calories that are right for you in a day to lose a healthy amount of weight per week. A healthy amount of weight to lose per week is usually 1-2 lb (0.5-0.9 kg). A dietitian can determine how many calories you need in a day and give you suggestions on how to reach your calorie goal.  WHAT IS MY MY PLAN? My goal is to have __________ calories per day.  If I have this many calories per day, I should lose around __________ pounds per week. WHAT DO I NEED TO KNOW ABOUT CALORIE COUNTING? In order to meet your daily calorie goal, you will need to:  Find out how many calories are in each food you would like to eat. Try to do this before you eat.  Decide how much of the food you can eat.  Write down what you ate and how many  calories it had. Doing this is called keeping a food log. WHERE DO I FIND CALORIE INFORMATION? The number of calories in a food can be found on a Nutrition Facts label. Note that all the information on a label is based on a specific serving of the food. If a food does not have a Nutrition Facts label, try to look up the calories online or ask your dietitian for help. HOW DO I DECIDE HOW MUCH TO EAT? To decide how much of the food you can eat, you will need to consider both the number of calories in one serving and the size of one serving. This information can be found on the Nutrition Facts label. If a food does not have a Nutrition Facts label, look  up the information online or ask your dietitian for help. Remember that calories are listed per serving. If you choose to have more than one serving of a food, you will have to multiply the calories per serving by the amount of servings you plan to eat. For example, the label on a package of bread might say that a serving size is 1 slice and that there are 90 calories in a serving. If you eat 1 slice, you will have eaten 90 calories. If you eat 2 slices, you will have eaten 180 calories. HOW DO I KEEP A FOOD LOG? After each meal, record the following information in your food log:  What you ate.  How much of it you ate.  How many calories it had.  Then, add up your calories. Keep your food log near you, such as in a small notebook in your pocket. Another option is to use a mobile app or website. Some programs will calculate calories for you and show you how many calories you have left each time you add an item to the log. WHAT ARE SOME CALORIE COUNTING TIPS?  Use your calories on foods and drinks that will fill you up and not leave you hungry. Some examples of this include foods like nuts and nut butters, vegetables, lean proteins, and high-fiber foods (more than 5 g fiber per serving).  Eat nutritious foods and avoid empty calories. Empty calories are calories you get from foods or beverages that do not have many nutrients, such as candy and soda. It is better to have a nutritious high-calorie food (such as an avocado) than a food with few nutrients (such as a bag of chips).  Know how many calories are in the foods you eat most often. This way, you do not have to look up how many calories they have each time you eat them.  Look out for foods that may seem like low-calorie foods but are really high-calorie foods, such as baked goods, soda, and fat-free candy.  Pay attention to calories in drinks. Drinks such as sodas, specialty coffee drinks, alcohol, and juices have a lot of calories yet do  not fill you up. Choose low-calorie drinks like water and diet drinks.  Focus your calorie counting efforts on higher calorie items. Logging the calories in a garden salad that contains only vegetables is less important than calculating the calories in a milk shake.  Find a way of tracking calories that works for you. Get creative. Most people who are successful find ways to keep track of how much they eat in a day, even if they do not count every calorie. WHAT ARE SOME PORTION CONTROL TIPS?  Know how many calories are in a serving. This  will help you know how many servings of a certain food you can have.  Use a measuring cup to measure serving sizes. This is helpful when you start out. With time, you will be able to estimate serving sizes for some foods.  Take some time to put servings of different foods on your favorite plates, bowls, and cups so you know what a serving looks like.  Try not to eat straight from a bag or box. Doing this can lead to overeating. Put the amount you would like to eat in a cup or on a plate to make sure you are eating the right portion.  Use smaller plates, glasses, and bowls to prevent overeating. This is a quick and easy way to practice portion control. If your plate is smaller, less food can fit on it.  Try not to multitask while eating, such as watching TV or using your computer. If it is time to eat, sit down at a table and enjoy your food. Doing this will help you to start recognizing when you are full. It will also make you more aware of what and how much you are eating. HOW CAN I CALORIE COUNT WHEN EATING OUT?  Ask for smaller portion sizes or child-sized portions.  Consider sharing an entree and sides instead of getting your own entree.  If you get your own entree, eat only half. Ask for a box at the beginning of your meal and put the rest of your entree in it so you are not tempted to eat it.  Look for the calories on the menu. If calories are listed,  choose the lower calorie options.  Choose dishes that include vegetables, fruits, whole grains, low-fat dairy products, and lean protein. Focusing on smart food choices from each of the 5 food groups can help you stay on track at restaurants.  Choose items that are boiled, broiled, grilled, or steamed.  Choose water, milk, unsweetened iced tea, or other drinks without added sugars. If you want an alcoholic beverage, choose a lower calorie option. For example, a regular margarita can have up to 700 calories and a glass of wine has around 150.  Stay away from items that are buttered, battered, fried, or served with cream sauce. Items labeled "crispy" are usually fried, unless stated otherwise.  Ask for dressings, sauces, and syrups on the side. These are usually very high in calories, so do not eat much of them.  Watch out for salads. Many people think salads are a healthy option, but this is often not the case. Many salads come with bacon, fried chicken, lots of cheese, fried chips, and dressing. All of these items have a lot of calories. If you want a salad, choose a garden salad and ask for grilled meats or steak. Ask for the dressing on the side, or ask for olive oil and vinegar or lemon to use as dressing.  Estimate how many servings of a food you are given. For example, a serving of cooked rice is  cup or about the size of half a tennis ball or one cupcake wrapper. Knowing serving sizes will help you be aware of how much food you are eating at restaurants. The list below tells you how big or small some common portion sizes are based on everyday objects.  1 oz--4 stacked dice.  3 oz--1 deck of cards.  1 tsp--1 dice.  1 Tbsp-- a Ping-Pong ball.  2 Tbsp--1 Ping-Pong ball.   cup--1 tennis ball or 1  cupcake wrapper.  1 cup--1 baseball.   This information is not intended to replace advice given to you by your health care provider. Make sure you discuss any questions you have with your  health care provider.   Document Released: 12/05/2005 Document Revised: 12/26/2014 Document Reviewed: 10/10/2013 Elsevier Interactive Patient Education 2016 [place] [employer] Exercising can help you to lose weight. In order to lose weight through exercise, you need to do vigorous-intensity exercise. You can tell that you are exercising with vigorous intensity if you are breathing very hard and fast and cannot hold a conversation while exercising. Moderate-intensity exercise helps to maintain your current weight. You can tell that you are exercising at a moderate level if you have a higher heart rate and faster breathing, but you are still able to hold a conversation. HOW OFTEN SHOULD I EXERCISE? Choose an activity that you enjoy and set realistic goals. Your health care provider can help you to make an activity plan that works for you. Exercise regularly as directed by your health care provider. This may include:  Doing resistance training twice each week, such as:  Push-ups.  Sit-ups.  Lifting weights.  Using resistance bands.  Doing a given intensity of exercise for a given amount of time. Choose from these options:  150 minutes of moderate-intensity exercise every week.  75 minutes of vigorous-intensity exercise every week.  A mix of moderate-intensity and vigorous-intensity exercise every week. Children, pregnant women, people who are out of shape, people who are overweight, and older adults may need to consult a health care provider for individual recommendations. If you have any sort of medical condition, be sure to consult your health care provider before starting a new exercise program. WHAT ARE SOME ACTIVITIES THAT CAN HELP ME TO LOSE WEIGHT?   Walking at a rate of at least 4.5 miles an hour.  Jogging or running at a rate of 5 miles per hour.  Biking at a rate of at least 10 miles per hour.  Lap swimming.  Roller-skating or in-line  skating.  Cross-country skiing.  Vigorous competitive sports, such as football, basketball, and soccer.  Jumping rope.  Aerobic dancing. HOW CAN I BE MORE ACTIVE IN MY DAY-TO-DAY ACTIVITIES?  Use the stairs instead of the elevator.  Take a walk during your lunch break.  If you drive, park your car farther away from work or school.  If you take public transportation, get off one stop early and walk the rest of the way.  Make all of your phone calls while standing up and walking around.  Get up, stretch, and walk around every 30 minutes throughout the day. WHAT GUIDELINES SHOULD I FOLLOW WHILE EXERCISING?  Do not exercise so much that you hurt yourself, feel dizzy, or get very short of breath.  Consult your health care provider prior to starting a new exercise program.  Wear comfortable clothes and shoes with good support.  Drink plenty of water while you exercise to prevent dehydration or heat stroke. Body water is lost during exercise and must be replaced.  Work out until you breathe faster and your heart beats faster.   This information is not intended to replace advice given to you by your health care provider. Make sure you discuss any questions you have with your health care provider.   Document Released: 01/07/2011 Document Revised: 12/26/2014 Document Reviewed: 05/08/2014 Elsevier Interactive Patient Education 2016 Else[employer] Inc[employer]  - Iron Deficiency Anemia, Adult Anemia is a  condition in which there are less red blood cells or hemoglobin in the blood than normal. Hemoglobin is the part of red blood cells that carries oxygen. Iron deficiency anemia is anemia caused by too little iron. It is the most common type of anemia. It may leave you tired and short of breath. CAUSES   Lack of iron in the diet.  Poor absorption of iron, as seen with intestinal disorders.  Intestinal bleeding.  Heavy periods. SIGNS AND SYMPTOMS  Mild anemia may not be noticeable. Symptoms  may include:  Fatigue.  Headache.  Pale skin.  Weakness.  Tiredness.  Shortness of breath.  Dizziness.  Cold hands and feet.  Fast or irregular heartbeat. DIAGNOSIS  Diagnosis requires a thorough evaluation and physical exam by your health care provider. Blood tests are generally used to confirm iron deficiency anemia. Additional tests may be done to find the underlying cause of your anemia. These may include:  Testing for blood in the stool (fecal occult blood test).  A procedure to see inside the colon and rectum (colonoscopy).  A procedure to see inside the esophagus and stomach (endoscopy). TREATMENT  Iron deficiency anemia is treated by correcting the cause of the deficiency. Treatment may involve:  Adding iron-rich foods to your diet.  Taking iron supplements. Pregnant or breastfeeding women need to take extra iron because their normal diet usually does not provide the required amount.  Taking vitamins. Vitamin C improves the absorption of iron. Your health care provider may recommend that you take your iron tablets with a glass of orange juice or vitamin C supplement.  Medicines to make heavy menstrual flow lighter.  Surgery. HOME CARE INSTRUCTIONS   Take iron as directed by your health care provider.  If you cannot tolerate taking iron supplements by mouth, talk to your health care provider about taking them through a vein (intravenously) or an injection into a muscle.  For the best iron absorption, iron supplements should be taken on an empty stomach. If you cannot tolerate them on an empty stomach, you may need to take them with food.  Do not drink milk or take antacids at the same time as your iron supplements. Milk and antacids may interfere with the absorption of iron.  Iron supplements can cause constipation. Make sure to include fiber in your diet to prevent constipation. A stool softener may also be recommended.  Take vitamins as directed by your  health care provider.  Eat a diet rich in iron. Foods high in iron include liver, lean beef, whole-grain bread, eggs, dried fruit, and dark green leafy vegetables. SEEK IMMEDIATE MEDICAL CARE IF:   You faint. If this happens, do not drive. Call your local emergency services (911 in [place] if no other help is available.  You have chest pain.  You feel nauseous or vomit.  You have severe or increased shortness of breath with activity.  You feel weak.  You have a rapid heartbeat.  You have unexplained sweating.  You become light-headed when getting up from a chair or bed. MAKE SURE YOU:   Understand these instructions.  Will watch your condition.  Will get help right away if you are not doing well or get worse.   This information is not intended to replace advice given to you by your health care provider. Make sure you discuss any questions you have with your health care provider.   Document Released: 12/02/2000 Document Revised: 12/26/2014 Document Reviewed: 08/12/2013 Elsevier Interactive Patient Education [employer].

## 2016-06-27 NOTE — Progress Notes (Signed)
Mary [name], is a 50 y.o. female  [id number]  [id number]  DOB - [birth year]  Chief Complaint  Patient presents with  . Medical Clearance    Surgery        Subjective:   Mary [name] is a 50 y.o. female here today for a follow up visit of htn.  She now has Bariatric surgery w/ Dr Arletta Bale in Sept, gastric sleeve planned. She as for medical clearance letter to be faxed to [facility] fax [phone].  She has been watching her salt, and taking all meds as prescribed.   Patient has No headache, No chest pain, No abdominal pain - No Nausea, No new weakness tingling or numbness, No Cough - SOB.  Problem  Anemia, Iron Deficiency    ALLERGIES: No Known Allergies  PAST MEDICAL HISTORY: Past Medical History  Diagnosis Date  . Hypertension   . Other and unspecified ovarian cysts   . Stroke ([place])   . Diabetes mellitus without complication ([place])   . Blood transfusion without reported diagnosis     transfusion December 2016    MEDICATIONS AT HOME: Prior to Admission medications   Medication Sig Start Date End Date Taking? Authorizing Provider  amLODipine (NORVASC) 10 MG tablet Take 1 tablet (10 mg total) by mouth daily. 05/09/16   Maren Reamer, MD  ferrous gluconate (FERGON) 324 MG tablet Take 1 tablet (324 mg total) by mouth daily with breakfast. 06/27/16   Maren Reamer, MD  hydrochlorothiazide (HYDRODIURIL) 25 MG tablet Take 1 tablet (25 mg total) by mouth daily. 05/09/16   Maren Reamer, MD  metFORMIN (GLUCOPHAGE) 500 MG tablet Take 1 tablet (500 mg total) by mouth 2 (two) times daily with a meal. 07/23/15   Lorayne Marek, MD  metoprolol tartrate (LOPRESSOR) 25 MG tablet Take 1 tablet (25 mg total) by mouth 2 (two) times daily. 03/28/16   Brittainy Erie Noe, PA-C  warfarin (COUMADIN) 5 MG tablet TAKE AS DIRECTED BY  COUMADIN  CLINIC 06/15/16   Thompson Grayer, MD     Objective:   Filed Vitals:   06/27/16 1059  BP: 130/84  Pulse: 71  Temp: 98.4  F (36.9 C)  TempSrc: Oral  Resp: 16  Weight: 291 lb 9.6 oz (132.269 kg)  SpO2: 97%   bmi 48.5  Exam General appearance : Awake, alert, not in any distress. Speech Clear. Not toxic looking , morbid obese HEENT: Atraumatic and Normocephalic, pupils equally reactive to light. Neck: supple, no JVD. No cervical lymphadenopathy.  Chest:Good air entry bilaterally, no added sounds. CVS: S1 S2 regular, no murmurs/gallups or rubs. Abdomen: Bowel sounds active, obese, Non tender and not distended with no gaurding, rigidity or rebound. Extremities: B/L Lower Ext shows no edema, both legs are warm to touch Neurology: Awake alert, and oriented X 3, CN II-XII grossly intact, Non focal Skin:No Rash  Data Review Lab Results  Component Value Date   HGBA1C 6.5* 05/09/2016   HGBA1C 5.5 02/29/2016   HGBA1C 6.6* 07/22/2015    Depression screen PHQ 2/9 06/27/2016 05/09/2016 02/29/2016 12/16/2014 12/16/2014  Decreased Interest 0 2 0 0 0  Down, Depressed, Hopeless 0 0 0 0 0  PHQ - 2 Score 0 2 0 0 0  Altered sleeping 1 0 - - -  Tired, decreased energy 3 2 - - -  Change in appetite 1 0 - - -  Feeling bad or failure about yourself  0 0 - - -  Trouble concentrating 1 0 - - -  Moving slowly or fidgety/restless 1 0 - - -  Suicidal thoughts 0 0 - - -  PHQ-9 Score 7 4 - - -   11/16 colonoscopy COLON POLYP, ASCENDING; HOT SNARE:  - TUBULAR ADENOMA, 0.8 CM.  - NEGATIVE FOR HIGH-GRADE DYSPLASIA AND MALIGNANCY.    Assessment & Plan   1. Essential hypertension - controlled on metoprolol 25bid,  and hctz 25qd, norvasc 10 qd  2. Cerebral infarction due to unspecified mechanism, crytogenic cva 01/2014 - no hx of atrial fib on heart monitor - On chronic coumadin, cardiology coumadin clinic following,   3. Encounter for screening mammogram for breast cancer - MM Digital Screening; Future  4. Anemia, iron deficiency Taking iron qday currently.  5. Remote hx of dvt  6. Need for pap, unable to do last  month, reminded to reset appt. - hx of colonoscopy , 11/16, polyp adenoma, needs 5year repeat.  7. Bariatric surgery pending w/ Dr Arletta Bale, gastric sleeve - per cardiology notes 03/30/16, pt has not hx of cad, no c/o of exertional cp, ekg nsr w/o ischemia, no further testing indicated at this time, pt considered low risk for surgery - letter written for pt,  1 copy to patient to bring to surgeon, and 1 will be faxed to [facility] fax [phone]., per pt provided number.  Patient have been counseled extensively about nutrition and exercise  Return in about 3 weeks (around 07/18/2016) for pap smear.  The patient was given clear instructions to go to ER or return to medical center if symptoms don't improve, worsen or new problems develop. The patient verbalized understanding. The patient was told to call to get lab results if they haven't heard anything in the next week.   This note has been created with Surveyor, quantity. Any transcriptional errors are unintentional.   Maren Reamer, MD, [facility], [place]   06/27/2016, 11:20 AM

## 2016-06-28 ENCOUNTER — Telehealth: Payer: Self-pay

## 2016-06-28 NOTE — Telephone Encounter (Signed)
Contacted patient to give her the appointment information for her mammogram. First appointment was schedule for July 19 check-in time of 930am. Patient is in unable to keep this appointment informed patient to contact [facility] to change her appointment.

## 2016-07-06 ENCOUNTER — Institutional Professional Consult (permissible substitution): Payer: Medicare Other | Admitting: Internal Medicine

## 2016-07-06 ENCOUNTER — Ambulatory Visit: Payer: Medicare Other

## 2016-07-06 DIAGNOSIS — E119 Type 2 diabetes mellitus without complications: Secondary | ICD-10-CM | POA: Diagnosis not present

## 2016-07-06 LAB — CUP PACEART REMOTE DEVICE CHECK: Date Time Interrogation Session: 20170629030825

## 2016-07-07 ENCOUNTER — Ambulatory Visit (INDEPENDENT_AMBULATORY_CARE_PROVIDER_SITE_OTHER): Payer: Medicare Other | Admitting: Internal Medicine

## 2016-07-07 ENCOUNTER — Telehealth: Payer: Self-pay | Admitting: Internal Medicine

## 2016-07-07 ENCOUNTER — Encounter: Payer: Self-pay | Admitting: Internal Medicine

## 2016-07-07 VITALS — BP 132/76 | HR 85 | Ht 65.0 in | Wt 294.0 lb

## 2016-07-07 DIAGNOSIS — G4719 Other hypersomnia: Secondary | ICD-10-CM

## 2016-07-07 DIAGNOSIS — I63412 Cerebral infarction due to embolism of left middle cerebral artery: Secondary | ICD-10-CM

## 2016-07-07 DIAGNOSIS — G4730 Sleep apnea, unspecified: Secondary | ICD-10-CM | POA: Diagnosis not present

## 2016-07-07 NOTE — Progress Notes (Signed)
[facility] Consultation      Date: 07/07/2016,   MRN# [id number] Mary [name] 1966/09/08 Code Status:  Code Status History    Date Active Date Inactive Code Status Order ID Comments User Context   11/09/2015 12:02 AM 11/11/2015  1:49 PM Full Code [id number]  Lytle Butte, MD ED     Hosp day:@LENGTHOFSTAYDAYS @ Referring MD: @ATDPROV @     PCP:      Admission                  Current  Mary [name] is a 50 y.o. old female seen in consultation for sleep apnea at the request of Dr. Duke Salvia     CHIEF COMPLAINT:   Problems snoring   HISTORY OF PRESENT ILLNESS   50 yo pleasant Obese AAF seen today for complaints of loud snoring, also associated with daytime sleepiness and fatigue. This has been going on for many years She states that most of the time she feels like she is dragging along, she has gained weight over the past 3 years weighs 294 pounds now She has a dx of HTN and DM Patient also states that she has non-efreshing sleep She has had a CVA 3 years ago, has problems with memory loss and aphasia She has no acute issues at this time No signs of infection at this time Patient is being evaluated for bariatric surgery pateint   PAST MEDICAL HISTORY   Past Medical History  Diagnosis Date  . Hypertension   . Other and unspecified ovarian cysts   . Stroke ([place])   . Diabetes mellitus without complication ([place])   . Blood transfusion without reported diagnosis     transfusion December 2016     SURGICAL HISTORY   Past Surgical History  Procedure Laterality Date  . Ovarian cyst removal    . Tee without cardioversion N/A 01/25/2013    Procedure: TRANSESOPHAGEAL ECHOCARDIOGRAM (TEE);  Surgeon: Birdie Riddle, MD;  Location: [facility];  Service: Cardiovascular;  Laterality: N/A;  . Loop recorder implant N/A 04/25/2014    Procedure: LOOP RECORDER IMPLANT;  Surgeon: Coralyn Mark, MD;  Location: [facility] CATH LAB;  Service: Cardiovascular;  Laterality:  N/A;  . Esophagogastroduodenoscopy (egd) with propofol N/A 11/10/2015    Procedure: ESOPHAGOGASTRODUODENOSCOPY (EGD) WITH PROPOFOL;  Surgeon: Lucilla Lame, MD;  Location: [facility] ENDOSCOPY;  Service: Endoscopy;  Laterality: N/A;  . Colonoscopy with propofol N/A 11/10/2015    Procedure: COLONOSCOPY WITH PROPOFOL;  Surgeon: Lucilla Lame, MD;  Location: [facility] ENDOSCOPY;  Service: Endoscopy;  Laterality: N/A;     FAMILY HISTORY   Family History  Problem Relation Age of Onset  . Diabetes Mellitus II Mother   . Diabetes Mother   . Stroke Mother   . Stroke Other   . Diabetes Other   . Transient ischemic attack Father   . Stroke Father   . Diabetes Sister      SOCIAL HISTORY   Social History  Substance Use Topics  . Smoking status: Never Smoker   . Smokeless tobacco: Never Used  . Alcohol Use: No     MEDICATIONS    Home Medication:  Current Outpatient Rx  Name  Route  Sig  Dispense  Refill  . amLODipine (NORVASC) 10 MG tablet   Oral   Take 1 tablet (10 mg total) by mouth daily.   90 tablet   3   . ferrous gluconate (FERGON) 324 MG tablet   Oral   Take 1  tablet (324 mg total) by mouth daily with breakfast.   90 tablet   3   . hydrochlorothiazide (HYDRODIURIL) 25 MG tablet   Oral   Take 1 tablet (25 mg total) by mouth daily.   90 tablet   3   . metFORMIN (GLUCOPHAGE) 500 MG tablet   Oral   Take 1 tablet (500 mg total) by mouth 2 (two) times daily with a meal.   180 tablet   3   . metoprolol tartrate (LOPRESSOR) 25 MG tablet   Oral   Take 1 tablet (25 mg total) by mouth 2 (two) times daily.   180 tablet   3   . warfarin (COUMADIN) 5 MG tablet      TAKE AS DIRECTED BY  COUMADIN  CLINIC   90 tablet   1     90 day supply     Current Medication:  Current outpatient prescriptions:  .  amLODipine (NORVASC) 10 MG tablet, Take 1 tablet (10 mg total) by mouth daily., Disp: 90 tablet, Rfl: 3 .  ferrous gluconate (FERGON) 324 MG tablet, Take 1 tablet (324 mg  total) by mouth daily with breakfast., Disp: 90 tablet, Rfl: 3 .  hydrochlorothiazide (HYDRODIURIL) 25 MG tablet, Take 1 tablet (25 mg total) by mouth daily., Disp: 90 tablet, Rfl: 3 .  metFORMIN (GLUCOPHAGE) 500 MG tablet, Take 1 tablet (500 mg total) by mouth 2 (two) times daily with a meal., Disp: 180 tablet, Rfl: 3 .  metoprolol tartrate (LOPRESSOR) 25 MG tablet, Take 1 tablet (25 mg total) by mouth 2 (two) times daily., Disp: 180 tablet, Rfl: 3 .  warfarin (COUMADIN) 5 MG tablet, TAKE AS DIRECTED BY  COUMADIN  CLINIC, Disp: 90 tablet, Rfl: 1    ALLERGIES   Review of patient's allergies indicates no known allergies.     REVIEW OF SYSTEMS   Review of Systems  Constitutional: Positive for malaise/fatigue. Negative for fever, chills, weight loss and diaphoresis.  HENT: Negative for congestion and hearing loss.   Eyes: Negative for blurred vision and double vision.  Respiratory: Negative for cough, hemoptysis, sputum production, shortness of breath and wheezing.   Cardiovascular: Negative for chest pain, palpitations, orthopnea and leg swelling.  Gastrointestinal: Negative for heartburn, nausea, vomiting and abdominal pain.  Genitourinary: Negative for dysuria.  Musculoskeletal: Negative for myalgias.  Skin: Negative for rash.  Neurological: Negative for dizziness, weakness and headaches.  Endo/Heme/Allergies: Does not bruise/bleed easily.  Psychiatric/Behavioral: Negative for depression. The patient is not nervous/anxious.   All other systems reviewed and are negative.    BP 132/76 mmHg  Pulse 85  Ht 5\' 5"  (1.651 m)  Wt 294 lb (133.358 kg)  BMI 48.92 kg/m2  SpO2 97%     PHYSICAL EXAM  Physical Exam  Constitutional: She is oriented to person, place, and time. She appears well-developed and well-nourished. No distress.  HENT:  Head: Normocephalic and atraumatic.  Mouth/Throat: No oropharyngeal exudate.  Eyes: EOM are normal. Pupils are equal, round, and reactive to  light. No scleral icterus.  Neck: Normal range of motion. Neck supple.  Cardiovascular: Normal rate, regular rhythm and normal heart sounds.   No murmur heard. Pulmonary/Chest: No stridor. No respiratory distress. She has no wheezes. She has no rales.  Abdominal: Soft. Bowel sounds are normal.  Musculoskeletal: Normal range of motion. She exhibits no edema.  Neurological: She is alert and oriented to person, place, and time. No cranial nerve deficit.  Skin: Skin is warm. She is not diaphoretic.  Psychiatric: She has a normal mood and affect.        ASSESSMENT/PLAN   50 yo pleasant Morbidly obese AAF with signs and symptoms of Sleep Apnea  Patient will need sleep study ASAP, will also check ONO Follow up after test completed   The Patient requires high complexity decision making for assessment and support, frequent evaluation and titration of therapies, application of advanced monitoring technologies and extensive interpretation of multiple databases.  Patient satisfied with Plan of action and management. All questions answered  Corrin Parker, M.D.  [facility]  Medical Director [place] Director [facility] Cardio-Pulmonary Department

## 2016-07-07 NOTE — Telephone Encounter (Signed)
Since patient has M'Caid pending and this insurance will not cover or acknowledge a HST or the results.  Patient has been contacted and would like to cancel the HST and order the in lab study. Please advise. Rhonda J Cobb

## 2016-07-07 NOTE — Patient Instructions (Signed)
Sleep study Sleep Apnea  Sleep apnea is a sleep disorder characterized by abnormal pauses in breathing while you sleep. When your breathing pauses, the level of oxygen in your blood decreases. This causes you to move out of deep sleep and into light sleep. As a result, your quality of sleep is poor, and the system that carries your blood throughout your body (cardiovascular system) experiences stress. If sleep apnea remains untreated, the following conditions can develop:  High blood pressure (hypertension).  Coronary artery disease.  Inability to achieve or maintain an erection (impotence).  Impairment of your thought process (cognitive dysfunction). There are three types of sleep apnea: 1. Obstructive sleep apnea--Pauses in breathing during sleep because of a blocked airway. 2. Central sleep apnea--Pauses in breathing during sleep because the area of the brain that controls your breathing does not send the correct signals to the muscles that control breathing. 3. Mixed sleep apnea--A combination of both obstructive and central sleep apnea. RISK FACTORS The following risk factors can increase your risk of developing sleep apnea:  Being overweight.  Smoking.  Having narrow passages in your nose and throat.  Being of older age.  Being female.  Alcohol use.  Sedative and tranquilizer use.  Ethnicity. Among individuals younger than 35 years, African Americans are at increased risk of sleep apnea. SYMPTOMS   Difficulty staying asleep.  Daytime sleepiness and fatigue.  Loss of energy.  Irritability.  Loud, heavy snoring.  Morning headaches.  Trouble concentrating.  Forgetfulness.  Decreased interest in sex.  Unexplained sleepiness. DIAGNOSIS  In order to diagnose sleep apnea, your caregiver will perform a physical examination. A sleep study done in the comfort of your own home may be appropriate if you are otherwise healthy. Your caregiver may also recommend that you  spend the night in a sleep lab. In the sleep lab, several monitors record information about your heart, lungs, and brain while you sleep. Your leg and arm movements and blood oxygen level are also recorded. TREATMENT The following actions may help to resolve mild sleep apnea:  Sleeping on your side.   Using a decongestant if you have nasal congestion.   Avoiding the use of depressants, including alcohol, sedatives, and narcotics.   Losing weight and modifying your diet if you are overweight. There also are devices and treatments to help open your airway:  Oral appliances. These are custom-made mouthpieces that shift your lower jaw forward and slightly open your bite. This opens your airway.  Devices that create positive airway pressure. This positive pressure "splints" your airway open to help you breathe better during sleep. The following devices create positive airway pressure:  Continuous positive airway pressure (CPAP) device. The CPAP device creates a continuous level of air pressure with an air pump. The air is delivered to your airway through a mask while you sleep. This continuous pressure keeps your airway open.  Nasal expiratory positive airway pressure (EPAP) device. The EPAP device creates positive air pressure as you exhale. The device consists of single-use valves, which are inserted into each nostril and held in place by adhesive. The valves create very little resistance when you inhale but create much more resistance when you exhale. That increased resistance creates the positive airway pressure. This positive pressure while you exhale keeps your airway open, making it easier to breath when you inhale again.  Bilevel positive airway pressure (BPAP) device. The BPAP device is used mainly in patients with central sleep apnea. This device is similar to the CPAP  device because it also uses an air pump to deliver continuous air pressure through a mask. However, with the BPAP  machine, the pressure is set at two different levels. The pressure when you exhale is lower than the pressure when you inhale.  Surgery. Typically, surgery is only done if you cannot comply with less invasive treatments or if the less invasive treatments do not improve your condition. Surgery involves removing excess tissue in your airway to create a wider passage way.   This information is not intended to replace advice given to you by your health care provider. Make sure you discuss any questions you have with your health care provider.   Document Released: 11/25/2002 Document Revised: 12/26/2014 Document Reviewed: 04/12/2012 Elsevier Interactive Patient Education [employer].

## 2016-07-13 ENCOUNTER — Ambulatory Visit (INDEPENDENT_AMBULATORY_CARE_PROVIDER_SITE_OTHER): Payer: Medicare Other

## 2016-07-13 DIAGNOSIS — I639 Cerebral infarction, unspecified: Secondary | ICD-10-CM | POA: Diagnosis not present

## 2016-07-13 DIAGNOSIS — Z5181 Encounter for therapeutic drug level monitoring: Secondary | ICD-10-CM

## 2016-07-13 LAB — POCT INR: INR: 1.7

## 2016-07-14 ENCOUNTER — Ambulatory Visit (INDEPENDENT_AMBULATORY_CARE_PROVIDER_SITE_OTHER): Payer: Medicare Other | Admitting: Neurology

## 2016-07-14 ENCOUNTER — Encounter: Payer: Self-pay | Admitting: Neurology

## 2016-07-14 VITALS — BP 129/80 | HR 72 | Wt 294.2 lb

## 2016-07-14 DIAGNOSIS — I10 Essential (primary) hypertension: Secondary | ICD-10-CM | POA: Diagnosis not present

## 2016-07-14 DIAGNOSIS — I63412 Cerebral infarction due to embolism of left middle cerebral artery: Secondary | ICD-10-CM

## 2016-07-14 DIAGNOSIS — D6861 Antiphospholipid syndrome: Secondary | ICD-10-CM

## 2016-07-14 DIAGNOSIS — E669 Obesity, unspecified: Secondary | ICD-10-CM | POA: Diagnosis not present

## 2016-07-14 DIAGNOSIS — I639 Cerebral infarction, unspecified: Secondary | ICD-10-CM

## 2016-07-14 MED FILL — METOPROLOL TARTRATE 25 MG T: 25 | 30 days supply | Qty: 60 | Fill #1

## 2016-07-14 NOTE — Progress Notes (Signed)
STROKE NEUROLOGY FOLLOW UP NOTE  NAME: Mary [name] DOB: 07-19-1966  REASON FOR VISIT: stroke follow up HISTORY FROM: pt and chart  Today we had the pleasure of seeing ALBERTO SCHOCH in follow-up at our Neurology Clinic. Pt was accompanied by no one.   History Summary [name] is a 50 year old lady with PMH of HTN and obesity was admitted for stroke on 01/23/13. She presented with several days history of slurred speech and expressive aphasia. Her symptoms actually began after an ovarian biopsy and she initially came to the ER and was discharged home but symptoms persisted and a few days later she returned and CT scan of the head this time showed a subacute left parietal MCA branch infarct. CT angiogram of the brain showed occluded distal branch of left middle cerebral artery corresponding to the infarct territory. CT angiogram of the neck showed no significant extracranial stenosis. MRI of the brain could not be obtained due to patient's obesity as well as severe claustrophobia. Transthoracic echo showed normal ejection fraction. Transesophageal echocardiogram showed no patent foramen ovale, vegetation or clot. EKG and telemetry monitoring did not reveal and cardiac arrhythmias. ESR,complements. ANA panel, RPR, HIV, antithrombin III and homocystine were all normal. The patient had significant expressive aphasia and was seen by physical, occupational and speech therapy and who was found to have deep vein thrombosis and hence was started on anticoagulation with xarelto. She was discharged and has continued to have outpatient speech therapy and has obtained some modest improvement in her speech. She still has significant word hesitancy but is able to communicate. She complains of mild headaches every other day for which she takes Tylenol which seemed to work quite well. She works as a Freight forwarder at [employer] and states she will be unable to perform her job in her current situation.   Follow up  04/15/13 - She reports no significant change in her expressive aphasia. She has no new complaints today. She still not working and has questions about when can she drive. TCD bubble study was negative.   Follow up 10/14/13 (LL): she comes back for stroke revisit. She is doing well, PCP changed her from Norvasc to Metoprolol because she was having frequent headaches, which has helped. She stopped Nortriptyline due to lethargy and it was not helping her headaches. She may need to restart Norvasc though, because BP is elevated today, 162/98. She is still in speech therapy .   Follow up 04/18/14 (LL): Since last visit, she returned to working part-time at [employer] in an different job role. She is very frustrated that she is not able to function in her previous capacity. She continues with ST twice weekly. Her blood pressure is better controlled on Norvasc and Metoprolol, though elevated in the office today at 155/93. She states her headaches are less frequent.   Follow up 10/07/14 - the patient has been doing OK. She is on norvac and metoprolol for BP control and today is 123/80. However, she stated that last Friday, she was working in [place], she felt funny feeling, not able to do what she was able to do before, she kept asking her co-workers, how to do this, how to do that, she has some difficulty understanding and express herself. She was thinking and talking slow, she felt not herself. On Saturday, she felt fine. She has not back to work yet. She went to her PCP and was told to be off work for a week and follow up  with neurology.   She was started on Xarelto for DVT in 01/2013 but stopped after 3 months. Suppose on ASA 16m as per chart, but currently she is not on any antiplatelet. She also had loop recorder placed in 05/2014 by Dr. ARayann Hemanand so far no Afib episodes found. However, her hypercoagulable work up initially showed positive lupus coagulant, no repeat done yet.  Follow up 11/17/14 - she had EEG  which did not show seizure like activity but left temporal slowing which is consistent with her stroke in the past. Her repeat lab test showed positive sickle cell screening and again positive lupus anticoagulant panel with high hexagonal phospholipid Neutral. She has not seen hematology yet. Her loop recorder so far is negative for AF episodes. She stated that she continued to have intermittent episodes with word finding difficulties, difficulty with comprehension and confusion at work, especially when she feels tired and stressed out, but she stated that she gradually is getting better than before. BP today 143/89.  Follow up 01/15/15 - she was doing the same. She still has some intermittent confusion and word finding difficulties but she has got used to it. She followed up with PCP and oncologist Dr. SAlen Blewand considered antiphospholipid syndrome and agreed on anticoagulation with coumadin. INR goal 2.5-3.5. Her INR fluctuates and currently following with coumadin clinic. INR last check 2.0. BP 143/89. Loop recorder continued to have no AF episodes.  Follow up 07/16/15 - no Afib episode on loop recorder. Last INR check one month ago was 1.8. She was doing well without stroke like symptoms. She still has some slow to reaction, some difficulty with language output but that is no change from before. She saw Dr. SAlen Blewin 04/2015 and pt would like to continue coumadin for stroke prevention. She has not seen PCP for a while. She is requesting psychology for counseling. BP today is good 119/80.  Follow up 02/24/16 - pt was admitted on 11/08/15 for acute pharyngitis. But found to have anemia, Hb at 7 (previous at 10). She received one unit of PRBC. EGD negative and colonoscopy found polyp which is adenoma by pathology. She also found to have nonbleeding internal hemorrhoids. Her coumadin restarted. She followed with GI and will repeat colonoscopy in 5 years. Repeat CBC on 12/17/15 Hb 8.3. She denies any GIB during the  interval time. She has plan to go for gastric bypass surgery for obesity and to go back to full time job in [place]  Interval History During the interval time, pt has been doing well, but complains of confusion, hard to focus or concentration at the end of 8 hour work day. I reassured her that is not new stroke but more recrudescence from her old stroke. She should have 4-6 work hour per day to limit over exertion herself at work. Her recent INR 3.6, 2.8, and recent one 1.7. She still follow up with coumadin clinic. Anemia stable and CBC in 04/2016 was 10.4. She is going to have gastric bypass surgery in 08/2016. BP 129/80 today.   REVIEW OF SYSTEMS: Full 14 system review of systems performed and notable only for those listed below and in HPI above, all others are negative:  Constitutional:   Cardiovascular: N/A  Ear/Nose/Throat: N/A  Skin: N/A  Eyes:   Respiratory:   Gastroitestinal: N/A  Genitourinary: N/A Hematology/Lymphatic: N/A  Endocrine: N/A  Musculoskeletal: N/A  Allergy/Immunology: N/A  Neurological: N/A Psychiatric: confusion  The following represents the patient's updated allergies and side effects list: No  Known Allergies  The neurologically relevant items on the patient's problem list were reviewed on today's visit.  Neurologic Examination  A problem focused neurological exam (12 or more points of the single system neurologic examination, vital signs counts as 1 point, cranial nerves count for 8 points) was performed.  Blood pressure 129/80, pulse 72, weight 294 lb 3.2 oz (133.4 kg).  General - Well nourished, well developed, in no apparent distress.  Ophthalmologic - not able to see through  Cardiovascular - Regular rate and rhythm with no murmur.  Mental Status -  Level of arousal and orientation to time, place, and person were intact. Language including expression, naming, repetition, comprehension was assessed and found intact.  Cranial Nerves II - XII - II  - Visual field intact OU. III, IV, VI - Extraocular movements intact. V - Facial sensation intact bilaterally. VII - Facial movement intact bilaterally. VIII - Hearing & vestibular intact bilaterally. X - Palate elevates symmetrically. XI - Chin turning & shoulder shrug intact bilaterally. XII - Tongue protrusion intact.  Motor Strength - The patient's strength was normal in all extremities and pronator drift was absent.  Bulk was normal and fasciculations were absent.   Motor Tone - Muscle tone was assessed at the neck and appendages and was normal.  Reflexes - The patient's reflexes were normal in all extremities and she had no pathological reflexes.  Sensory - Light touch, temperature/pinprick, vibration and proprioception, and Romberg testing were assessed and were normal.    Coordination - The patient had normal movements in the hands and feet with no ataxia or dysmetria.  Tremor was absent.  Gait and Station - The patient's transfers, posture, gait, station, and turns were observed as normal.  Data reviewed: I personally reviewed the images and agree with the radiology interpretations.  01/23/13 CT head 1. Early subacute left parietal lobe infarct.  2. No evidence of intracranial hemorrhage, mass effect, or midline shift.  3. Chronic small vessel disease and old deep white matter infarcts are seen bilaterally  01/24/14 CTA head and neck 1. No significant stenosis of the cervical vasculature.  2. Mild atherosclerotic irregularity of the left carotid bifurcation without significant stenosis 3. Occluded distal left MCA branch vessel corresponding to the  infarct territory.  4. Moderate small vessel disease.  5. Mild atherosclerotic irregularity within the cavernous carotid  arteries bilaterally without significant stenosis.  TCD bubble study - 04/26/13 No R to L shunt, negative bubble study.  EEG 10/22/14 - This is an abnormal EEG recording secondary to intermittent theta slowing  emanating from the left temporal region. This slowing may correlate with the prior stroke event, and no clear epileptiform discharges were seen during the study. Clinical correlation is required.  Component     Latest Ref Rng 01/24/2013 05/30/2014 10/07/2014  Prothrombin Time        13.7  INR        1.1  APTT        40.1 (H)  APTT 1:1 NP        33.7  APTT 1:1 Saline        50.7  THROMBIN TIME        16.7  DRVVT Screen Seconds        42.6  DRVVT Confirm Seconds        CANCELED  DRVVT Ratio        CANCELED  Hexagonal Phospholipid Neutral        9 (H)  Platelet Neutralization  5.7 (H)  Anticardiolipin Ab, IgG        CANCELED  Anticardiolipin Ab, IgM        CANCELED  Beta-2 Glycoprotein I, IgG        CANCELED  Beta-2 Glycoprotein I, IgM        CANCELED  Beta-2 Glycoprotein I, IgA        CANCELED  LAC Interpretation        Comment  PTT Lupus Anticoagulant     28.0 - 43.0 secs 44.3 (H)    PTTLA Confirmation     <8.0 secs 11.5 (H)    PTTLA 4:1 Mix     28.0 - 43.0 secs 45.6 (H)    DRVVT     <42.9 secs 38.8    Drvvt confirmation     <1.11 Ratio NOT APPL    dRVVT Incubated 1:1 Mix     <42.9 secs NOT APPL    Lupus Anticoagulant     NOT DETECTED DETECTED (A)    Total Protein     6.0 - 8.5 g/dL   7.9  Albumin     3.5 - 5.5 g/dL   3.9  Total Bilirubin     0.0 - 1.2 mg/dL   0.3  Bilirubin, Direct     0.00 - 0.40 mg/dL   0.10  Alkaline Phosphatase     39 - 117 IU/L   71  AST     0 - 40 IU/L   17  ALT     0 - 32 IU/L   16  ENA RNP Ab     0.0 - 0.9 AI   0.3  ENA SM Ab Ser-aCnc     0.0 - 0.9 AI   <0.2  Rhuematoid fact SerPl-aCnc     0.0 - 13.9 IU/mL   11.0  Chromatin Ab SerPl-aCnc     0.0 - 0.9 AI   <0.2  ENA SSA (RO) Ab     0.0 - 0.9 AI   <0.2  ENA SSB (LA) Ab     0.0 - 0.9 AI   <0.2  dsDNA Ab     0 - 9 IU/mL   1  Cholesterol     0 - 200 mg/dL  126   Triglycerides     <150 mg/dL  91   HDL     >39 mg/dL  40   Total CHOL/HDL Ratio       3.2     VLDL     0 - 40 mg/dL  18   LDL (calc)     0 - 99 mg/dL  68   Anticardiolipin IgG     0 - 14 GPL U/mL 4 (L)  <9  Anticardiolipin IgM     0 - 12 MPL U/mL 3 (L)  <9  Anticardiolipin IgA     <22 APL U/mL 7 (L)    C-ANCA     Neg:<1:20 titer   <1:20  P-ANCA     Neg:<1:20 titer   <1:20  Atypical pANCA     Neg:<1:20 titer   <1:20  Beta-2 Glyco I IgG     0 - 20 GPI IgG units   <9  Beta-2 Glyco 1 IgA     0 - 25 GPI IgA units   31 (H)  Beta-2 Glyco 1 IgM     0 - 32 GPI IgM units   <9  Hgb A1c MFr Bld     4.8 - 5.6 %  6.3 (H)  Est. average glucose Bld gHb Est-mCnc        134  C3 Complement     90 - 180 mg/dL 160    Complement C4, Body Fluid     10 - 40 mg/dL 34    ANA Ser Ql     Negative NEGATIVE  Negative  RPR     NON REACTIVE NON REACTIVE    HIV     NON REACTIVE NON REACTIVE    AntiThromb III Func     75 - 120 % 107    Protein C Activity     74 - 151 % 153 (H)  144  Protein C, Total     72 - 160 % 88    Protein S Activity     60 - 145 % 55 (L)  66  Protein S Ag, Total     60 - 150 % 66    Homocysteine     4.0 - 15.4 umol/L 8.1    TSH     0.350 - 4.500 uIU/mL  1.903   Sickle Cell Prep     Negative   Positive (A)  Homocysteine     0.0 - 15.0 umol/L   9.9   Component     Latest Ref Rng 04/17/2015  PTT Lupus Anticoagulant     28.0 - 43.0 secs 53.7 (H)  PTTLA 4:1 Mix     28.0 - 43.0 secs 44.0 (H)  PTTLA Confirmation     <8.0 secs 4.3  DRVVT     <42.9 secs 40.8  DRVVT 1:1 Mix     <42.9 secs NOT APPL  Drvvt confirmation     <1.15 Ratio NOT APPL  Lupus Anticoagulant     NOT DETECTED NOT DETECTED  HGB A     96.8 - 97.8 % 65.8 (L)  Hgb A2 Quant     2.2 - 3.2 % 2.8  Hgb F Quant     0.0 - 2.0 % 0.0  Hgb S Quant     0.0 % 31.4 (H)  Hemoglobin Other     0.0 % 0.0  Beta-2 Glyco I IgG     <20 G Units 6  Beta-2-Glycoprotein I IgM     <20 M Units 5  Beta-2-Glycoprotein I IgA     <20 A Units 7    Assessment: As you may recall, she is a 50 y.o. African  American female with PMH of HTN and obesity was admitted on 01/2013 for left MCA stroke. Head CT showed bilateral old WM infarcts too. She was found to have DVT and put on Xarelto which was stopped after 3 months. Hypercoagulable work up questions for antiphospholipid antibody syndrome. Repeat hypercoagulable work up again showed positive for lupus anticoagulant panel as well as positive sickle cell screening.  According to her history, young, female, with multiple arterial and venous thrombosis with positive anticoagulant tests x 2, antiphospholipid antibody syndrome is considered until proven otherwise. She also saw Dr. Alen Blew for APL and positive sickle cell screening, Dr. Alen Blew agreed on anticoagulation with coumadin with INR 2.5-3.5. Currently she is following with coumadin clinic. Her recent INR was low. Sickle cell trait no need intervention.   Had anemia in 10/2015 requiring one unit PRBC, EGD and colonoscopy showed intestine polyp, was removed and showed adenoma. Will need colonoscopy in 5 years. Repeat CBC stable in 11/2015 and 04/2016.   She complains about confusion when she works longer hours or when she is  tired or fatigue with stress and anxiety. I reassured her that those are the body telling her to slow down, have a break or rest. We recommend 4-6 work hours per day for her.   Plan:  - continue coumadin for possible APL syndrome - INR goal 2.5-3.5. Follow up with coumadin clinic - Follow up with your primary care physician for stroke risk factor modification. Recommend maintain blood pressure goal <130/80, diabetes with hemoglobin A1c goal below 6.5% and lipids with LDL cholesterol goal below 70 mg/dL.  - follow up with PCP to monitor anemia. - check BP at home. - OK with gastric bypass surgery in 08/2016. Recommend lovenox bridging.  - recommend take it easy at work, and 4-6 hours a day work hours.  - follow up in 6 months.  I spent more than 25 minutes of face to face time with the  patient. Greater than 50% of time was spent in counseling and coordination of care. We discussed about avoid over exertion at work, anemia monitoring, continue of coumadin and lovenox bridging for gastric bypass surgery.    No orders of the defined types were placed in this encounter.   No orders of the defined types were placed in this encounter.   There are no Patient Instructions on file for this visit.  Rosalin Hawking, MD PhD [facility] [address], [place],  [address] [phone]

## 2016-07-14 NOTE — Patient Instructions (Signed)
-   continue coumadin for possible APL syndrome - INR goal 2.5-3.5. Follow up with coumadin clinic - Follow up with your primary care physician for stroke risk factor modification. Recommend maintain blood pressure goal <130/80, diabetes with hemoglobin A1c goal below 6.5% and lipids with LDL cholesterol goal below 70 mg/dL.  - follow up with PCP to monitor anemia. - check BP at home. - OK with gastric bypass surgery in 08/2016. Recommend lovenox bridging.  - recommend take it easy at work, 4-6 hours a day work hours is the best option.  - you may feel difficulty concentration or focus or confused if you tired, fatigue or stressful anxious, that is the way the body tells you to slow down and have a break or rest.  - follow up in 6 months.

## 2016-07-15 ENCOUNTER — Ambulatory Visit (INDEPENDENT_AMBULATORY_CARE_PROVIDER_SITE_OTHER): Payer: Medicare Other | Admitting: *Deleted

## 2016-07-15 DIAGNOSIS — I639 Cerebral infarction, unspecified: Secondary | ICD-10-CM | POA: Diagnosis not present

## 2016-07-18 NOTE — Progress Notes (Signed)
Carelink Summary Report / Loop Recorder 

## 2016-07-27 ENCOUNTER — Telehealth: Payer: Self-pay | Admitting: Cardiology

## 2016-07-27 ENCOUNTER — Ambulatory Visit (INDEPENDENT_AMBULATORY_CARE_PROVIDER_SITE_OTHER): Payer: Medicare Other

## 2016-07-27 DIAGNOSIS — I639 Cerebral infarction, unspecified: Secondary | ICD-10-CM | POA: Diagnosis not present

## 2016-07-27 DIAGNOSIS — Z5181 Encounter for therapeutic drug level monitoring: Secondary | ICD-10-CM | POA: Diagnosis not present

## 2016-07-27 LAB — POCT INR: INR: 2.6

## 2016-07-27 NOTE — Telephone Encounter (Signed)
Spoke w/ pt and requested that she send a manual transmission b/c her home monitor has not updated in at least 14 days.   

## 2016-08-02 NOTE — Telephone Encounter (Signed)
Order placed for in lab sleep study due to pt insurance. Nothing further needed.

## 2016-08-02 NOTE — Telephone Encounter (Signed)
Pt came in to check on status of Sleep Study. Advised patient that we have her down for HST, which if she is approved for Medicaid, her study would not be covered by Medicaid and neither would her CPAP if needed.  Pt would prefer to have an in lab study at [facility] in [place].    If you agree, please send message to [facility] to place order for in lab study and she will cancel the HST order as well.  Please advise. Rhonda J Cobb

## 2016-08-10 DIAGNOSIS — I10 Essential (primary) hypertension: Secondary | ICD-10-CM | POA: Diagnosis not present

## 2016-08-10 DIAGNOSIS — Z8673 Personal history of transient ischemic attack (TIA), and cerebral infarction without residual deficits: Secondary | ICD-10-CM | POA: Diagnosis not present

## 2016-08-10 DIAGNOSIS — E119 Type 2 diabetes mellitus without complications: Secondary | ICD-10-CM | POA: Diagnosis not present

## 2016-08-10 DIAGNOSIS — Z7901 Long term (current) use of anticoagulants: Secondary | ICD-10-CM | POA: Diagnosis not present

## 2016-08-11 ENCOUNTER — Telehealth: Payer: Self-pay | Admitting: Cardiology

## 2016-08-11 NOTE — Telephone Encounter (Signed)
Spoke w/ pt and requested that she send a manual transmission b/c her home monitor has not updated in at least 14 days.   

## 2016-08-14 LAB — CUP PACEART REMOTE DEVICE CHECK: Date Time Interrogation Session: 20170729032018

## 2016-08-15 ENCOUNTER — Ambulatory Visit (INDEPENDENT_AMBULATORY_CARE_PROVIDER_SITE_OTHER): Payer: Medicare Other | Admitting: *Deleted

## 2016-08-15 DIAGNOSIS — I639 Cerebral infarction, unspecified: Secondary | ICD-10-CM | POA: Diagnosis not present

## 2016-08-16 NOTE — Progress Notes (Signed)
Carelink Summary Report / Loop Recorder 

## 2016-08-17 ENCOUNTER — Telehealth: Payer: Self-pay | Admitting: *Deleted

## 2016-08-17 ENCOUNTER — Ambulatory Visit (INDEPENDENT_AMBULATORY_CARE_PROVIDER_SITE_OTHER): Payer: Medicare Other | Admitting: *Deleted

## 2016-08-17 DIAGNOSIS — I639 Cerebral infarction, unspecified: Secondary | ICD-10-CM

## 2016-08-17 DIAGNOSIS — Z5181 Encounter for therapeutic drug level monitoring: Secondary | ICD-10-CM | POA: Diagnosis not present

## 2016-08-17 LAB — POCT INR: INR: 4.6

## 2016-08-17 NOTE — Telephone Encounter (Signed)
Called patient to request that she send a manual transmission from her home monitor.  Assisted patient with transmission steps, awaiting results in [facility].  Advised patient I will call back if transmission is not received and she is agreeable.  Patient also requests a document that lists her ILR info for her employer.  Advised patient that she can use her ID card for this purpose.  Gave website and phone number info for assistance with ordering new ID card.  Patient verbalizes understanding and appreciation.

## 2016-08-18 MED FILL — ?METOPROLOL TARTRATE 25 MG: 25 | 30 days supply | Qty: 60 | Fill #2

## 2016-08-23 NOTE — Telephone Encounter (Signed)
Advised patient that manual transmission was still not received.  Patient states that she is at work right now, but she will try again when she gets home tonight.  She is agreeable to me calling her back tomorrow if the transmission is still not received.  Patient appreciative of call and denies additional questions or concerns at this time.

## 2016-08-26 ENCOUNTER — Ambulatory Visit: Payer: Medicare Other | Admitting: Neurology

## 2016-08-30 ENCOUNTER — Encounter: Payer: Self-pay | Admitting: Internal Medicine

## 2016-08-30 NOTE — Telephone Encounter (Signed)
Manual transmission successfully received on 08/24/16.

## 2016-08-31 ENCOUNTER — Ambulatory Visit (INDEPENDENT_AMBULATORY_CARE_PROVIDER_SITE_OTHER): Payer: Medicare Other | Admitting: *Deleted

## 2016-08-31 ENCOUNTER — Telehealth: Payer: Self-pay

## 2016-08-31 DIAGNOSIS — I639 Cerebral infarction, unspecified: Secondary | ICD-10-CM

## 2016-08-31 DIAGNOSIS — Z5181 Encounter for therapeutic drug level monitoring: Secondary | ICD-10-CM | POA: Diagnosis not present

## 2016-08-31 LAB — POCT INR: INR: 3.2

## 2016-08-31 NOTE — Telephone Encounter (Signed)
Call patient re sleep study.  Patient wants to talk about electrodes in scalp and weave.

## 2016-08-31 NOTE — Telephone Encounter (Signed)
Spoke with patient regarding pause episode on ILR from 07/29/16 (monitor became disconnected and episode did not transmit until manual transmission).  Patient denies symptoms/syncope with specific episode, but states that she was trying to "do too much" in August as she started a second job and subsequently had to quit it.  Patient is aware to call our office if she experiences any new or worsening symptoms.  She is appreciative of assistance and denies additional questions or concerns at this time.

## 2016-08-31 NOTE — Telephone Encounter (Signed)
Tried to call cell number and unable to LM & no answer. Tried to call home number and kept ringing with no answer or VM.

## 2016-09-01 NOTE — Telephone Encounter (Signed)
Informed pt she may want to remove her weave before sleep study. Nothing further needed.

## 2016-09-06 ENCOUNTER — Ambulatory Visit: Payer: Medicare Other | Attending: Pulmonary Disease

## 2016-09-06 DIAGNOSIS — Z6841 Body Mass Index (BMI) 40.0 and over, adult: Secondary | ICD-10-CM | POA: Insufficient documentation

## 2016-09-06 DIAGNOSIS — Z8673 Personal history of transient ischemic attack (TIA), and cerebral infarction without residual deficits: Secondary | ICD-10-CM | POA: Diagnosis not present

## 2016-09-06 DIAGNOSIS — I10 Essential (primary) hypertension: Secondary | ICD-10-CM | POA: Diagnosis not present

## 2016-09-06 DIAGNOSIS — G4733 Obstructive sleep apnea (adult) (pediatric): Secondary | ICD-10-CM | POA: Diagnosis not present

## 2016-09-06 MED FILL — ?METOPROLOL 25 MG TABLET: 25 | 30 days supply | Qty: 60 | Fill #0

## 2016-09-08 ENCOUNTER — Telehealth: Payer: Self-pay | Admitting: *Deleted

## 2016-09-08 NOTE — Telephone Encounter (Signed)
Tried to call the pt to inform her of sleep study results. Tried calling the cell and it states she is not taking calls at this time. Tried home number and it comes in busy.

## 2016-09-09 NOTE — Telephone Encounter (Signed)
Tried to call pt but message states pt is not accepting calls at this time and hangs up.

## 2016-09-10 LAB — CUP PACEART REMOTE DEVICE CHECK: Date Time Interrogation Session: 20170828033853

## 2016-09-10 NOTE — Progress Notes (Signed)
Carelink summary report received. Battery status OK. Normal device function. No new symptom episodes, tachy episodes, brady episodes. No new AF episodes. Monthly summary reports and ROV/PRN

## 2016-09-12 ENCOUNTER — Encounter: Payer: Self-pay | Admitting: *Deleted

## 2016-09-12 NOTE — Telephone Encounter (Signed)
Tried to call pt and message on phone states pt is not receiving calls at this time. Will mail letter since unable to [facility] or speak to pt. Will close encounter since mailing letter.

## 2016-09-13 ENCOUNTER — Ambulatory Visit (INDEPENDENT_AMBULATORY_CARE_PROVIDER_SITE_OTHER): Payer: Medicare Other | Admitting: *Deleted

## 2016-09-13 DIAGNOSIS — I639 Cerebral infarction, unspecified: Secondary | ICD-10-CM | POA: Diagnosis not present

## 2016-09-14 NOTE — Progress Notes (Signed)
Carelink Summary Report / Loop Recorder 

## 2016-09-21 ENCOUNTER — Ambulatory Visit (INDEPENDENT_AMBULATORY_CARE_PROVIDER_SITE_OTHER): Payer: Medicare Other

## 2016-09-21 DIAGNOSIS — Z5181 Encounter for therapeutic drug level monitoring: Secondary | ICD-10-CM

## 2016-09-21 DIAGNOSIS — I639 Cerebral infarction, unspecified: Secondary | ICD-10-CM

## 2016-09-21 LAB — POCT INR: INR: 3.2

## 2016-09-22 DIAGNOSIS — Z713 Dietary counseling and surveillance: Secondary | ICD-10-CM | POA: Diagnosis not present

## 2016-09-22 DIAGNOSIS — E119 Type 2 diabetes mellitus without complications: Secondary | ICD-10-CM | POA: Diagnosis not present

## 2016-09-22 DIAGNOSIS — Z5181 Encounter for therapeutic drug level monitoring: Secondary | ICD-10-CM | POA: Diagnosis not present

## 2016-10-05 ENCOUNTER — Telehealth: Payer: Self-pay

## 2016-10-05 NOTE — Telephone Encounter (Signed)
L MOM for pt to call and schedule cardiac clearance for bariatric surgery.

## 2016-10-13 ENCOUNTER — Ambulatory Visit (INDEPENDENT_AMBULATORY_CARE_PROVIDER_SITE_OTHER): Payer: Medicare Other | Admitting: Cardiology

## 2016-10-13 ENCOUNTER — Telehealth: Payer: Self-pay | Admitting: Cardiology

## 2016-10-13 ENCOUNTER — Ambulatory Visit (INDEPENDENT_AMBULATORY_CARE_PROVIDER_SITE_OTHER): Payer: Medicare Other | Admitting: *Deleted

## 2016-10-13 ENCOUNTER — Encounter: Payer: Self-pay | Admitting: Cardiology

## 2016-10-13 VITALS — BP 114/72 | HR 67 | Ht 65.0 in | Wt 296.5 lb

## 2016-10-13 DIAGNOSIS — R0602 Shortness of breath: Secondary | ICD-10-CM

## 2016-10-13 DIAGNOSIS — Z01818 Encounter for other preprocedural examination: Secondary | ICD-10-CM | POA: Diagnosis not present

## 2016-10-13 DIAGNOSIS — I10 Essential (primary) hypertension: Secondary | ICD-10-CM

## 2016-10-13 DIAGNOSIS — R011 Cardiac murmur, unspecified: Secondary | ICD-10-CM

## 2016-10-13 DIAGNOSIS — I639 Cerebral infarction, unspecified: Secondary | ICD-10-CM

## 2016-10-13 NOTE — Patient Instructions (Addendum)
Testing/Procedures: Your physician has requested that you have an echocardiogram. Echocardiography is a painless test that uses sound waves to create images of your heart. It provides your doctor with information about the size and shape of your heart and how well your heart's chambers and valves are working. This procedure takes approximately one hour. There are no restrictions for this procedure.  [employer]  Your caregiver has ordered a Stress Test with nuclear imaging. The purpose of this test is to evaluate the blood supply to your heart muscle. This procedure is referred to as a "Non-Invasive Stress Test." This is because other than having an IV started in your vein, nothing is inserted or "invades" your body. Cardiac stress tests are done to find areas of poor blood flow to the heart by determining the extent of coronary artery disease (CAD). Some patients exercise on a treadmill, which naturally increases the blood flow to your heart, while others who are  unable to walk on a treadmill due to physical limitations have a pharmacologic/chemical stress agent called Lexiscan . This medicine will mimic walking on a treadmill by temporarily increasing your coronary blood flow.   Please note: these test may take anywhere between 2-4 hours to complete  PLEASE REPORT TO [place] TO GO  Date of Procedure:Thursday October 20, 2016 & Friday October 21, 2016 both days at 09:00AM  Arrival Time for Procedure:___Arrive at 08:45AM to register both days___  Instructions regarding medication:   _X_ : Hold diabetes medication Metformin (Glucophage) the night before or morning of procedure  _X_:  Hold Metoprolol the night before procedure and morning of procedure    PLEASE NOTIFY THE OFFICE AT LEAST 24 HOURS IN ADVANCE IF YOU ARE UNABLE TO KEEP YOUR APPOINTMENT.  [phone] AND  PLEASE NOTIFY NUCLEAR MEDICINE AT [facility] AT LEAST  24 HOURS IN ADVANCE IF YOU ARE UNABLE TO KEEP YOUR APPOINTMENT. [phone]  How to prepare for your Myoview test:  1. Do not eat or drink after midnight 2. No caffeine for 24 hours prior to test 3. No smoking 24 hours prior to test. 4. Your medication may be taken with water.  If your doctor stopped a medication because of this test, do not take that medication. 5. Ladies, please do not wear dresses.  Skirts or pants are appropriate. Please wear a short sleeve shirt. 6. No perfume, cologne or lotion. 7. Wear comfortable walking shoes. No heels!   Follow-Up: Your physician recommends that you schedule a follow-up appointment as needed. We will call you with results and if needed schedule follow up at that time.   It was a pleasure seeing you today here in the office. Please do not hesitate to give [place] a call back if you have any further questions. [place], BSN    Echocardiogram An echocardiogram, or echocardiography, uses sound waves (ultrasound) to produce an image of your heart. The echocardiogram is simple, painless, obtained within a short period of time, and offers valuable information to your health care provider. The images from an echocardiogram can provide information such as:  Evidence of coronary artery disease (CAD).  Heart size.  Heart muscle function.  Heart valve function.  Aneurysm detection.  Evidence of a past heart attack.  Fluid buildup around the heart.  Heart muscle thickening.  Assess heart valve function. LET [facility] CARE PROVIDER KNOW ABOUT:  Any allergies you have.  All  medicines you are taking, including vitamins, herbs, eye drops, creams, and over-the-counter medicines.  Previous problems you or members of your family have had with the use of anesthetics.  Any blood disorders you have.  Previous surgeries you have had.  Medical conditions you have.  Possibility of pregnancy, if this applies. BEFORE THE  PROCEDURE  No special preparation is needed. Eat and drink normally.  PROCEDURE   In order to produce an image of your heart, gel will be applied to your chest and a wand-like tool (transducer) will be moved over your chest. The gel will help transmit the sound waves from the transducer. The sound waves will harmlessly bounce off your heart to allow the heart images to be captured in real-time motion. These images will then be recorded.  You may need an IV to receive a medicine that improves the quality of the pictures. AFTER THE PROCEDURE You may return to your normal schedule including diet, activities, and medicines, unless your health care provider tells you otherwise.   This information is not intended to replace advice given to you by your health care provider. Make sure you discuss any questions you have with your health care provider.   Document Released: 12/02/2000 Document Revised: 12/26/2014 Document Reviewed: 08/12/2013 Elsevier Interactive Patient Education 2016 [place].      Pharmacologic Stress Electrocardiogram A pharmacologic stress electrocardiogram is a heart (cardiac) test that uses nuclear imaging to evaluate the blood supply to your heart. This test may also be called a pharmacologic stress electrocardiography. Pharmacologic means that a medicine is used to increase your heart rate and blood pressure.  This stress test is done to find areas of poor blood flow to the heart by determining the extent of coronary artery disease (CAD). Some people exercise on a treadmill, which naturally increases the blood flow to the heart. For those people unable to exercise on a treadmill, a medicine is used. This medicine stimulates your heart and will cause your heart to beat harder and more quickly, as if you were exercising.  Pharmacologic stress tests can help determine:  The adequacy of blood flow to your heart during increased levels of activity in order to clear you for  discharge home.  The extent of coronary artery blockage caused by CAD.  Your prognosis if you have suffered a heart attack.  The effectiveness of cardiac procedures done, such as an angioplasty, which can increase the circulation in your coronary arteries.  Causes of chest pain or pressure. LET [facility] CARE PROVIDER KNOW ABOUT:  Any allergies you have.  All medicines you are taking, including vitamins, herbs, eye drops, creams, and over-the-counter medicines.  Previous problems you or members of your family have had with the use of anesthetics.  Any blood disorders you have.  Previous surgeries you have had.  Medical conditions you have.  Possibility of pregnancy, if this applies.  If you are currently breastfeeding. RISKS AND COMPLICATIONS Generally, this is a safe procedure. However, as with any procedure, complications can occur. Possible complications include:  You develop pain or pressure in the following areas:  Chest.  Jaw or neck.  Between your shoulder blades.  Radiating down your left arm.  Headache.  Dizziness or light-headedness.  Shortness of breath.  Increased or irregular heartbeat.  Low blood pressure.  Nausea or vomiting.  Flushing.  Redness going up the arm and slight pain during injection of medicine.  Heart attack (rare). BEFORE THE PROCEDURE   Avoid all forms of  caffeine for 24 hours before your test or as directed by your health care provider. This includes coffee, tea (even decaffeinated tea), caffeinated sodas, chocolate, cocoa, and certain pain medicines.  Follow your health care provider's instructions regarding eating and drinking before the test.  Take your medicines as directed at regular times with water unless instructed otherwise. Exceptions may include:  If you have diabetes, ask how you are to take your insulin or pills. It is common to adjust insulin dosing the morning of the test.  If you are taking beta-blocker  medicines, it is important to talk to your health care provider about these medicines well before the date of your test. Taking beta-blocker medicines may interfere with the test. In some cases, these medicines need to be changed or stopped 24 hours or more before the test.  If you wear a nitroglycerin patch, it may need to be removed prior to the test. Ask your health care provider if the patch should be removed before the test.  If you use an inhaler for any breathing condition, bring it with you to the test.  If you are an outpatient, bring a snack so you can eat right after the stress phase of the test.  Do not smoke for 4 hours prior to the test or as directed by your health care provider.  Do not apply lotions, powders, creams, or oils on your chest prior to the test.  Wear comfortable shoes and clothing. Let your health care provider know if you were unable to complete or follow the preparations for your test. PROCEDURE   Multiple patches (electrodes) will be put on your chest. If needed, small areas of your chest may be shaved to get better contact with the electrodes. Once the electrodes are attached to your body, multiple wires will be attached to the electrodes, and your heart rate will be monitored.  An IV access will be started. A nuclear trace (isotope) is given. The isotope may be given intravenously, or it may be swallowed. Nuclear refers to several types of radioactive isotopes, and the nuclear isotope lights up the arteries so that the nuclear images are clear. The isotope is absorbed by your body. This results in low radiation exposure.  A resting nuclear image is taken to show how your heart functions at rest.  A medicine is given through the IV access.  A second scan is done about 1 hour after the medicine injection and determines how your heart functions under stress.  During this stress phase, you will be connected to an electrocardiogram machine. Your blood pressure  and oxygen levels will be monitored. AFTER THE PROCEDURE   Your heart rate and blood pressure will be monitored after the test.  You may return to your normal schedule, including diet,activities, and medicines, unless your health care provider tells you otherwise.   This information is not intended to replace advice given to you by your health care provider. Make sure you discuss any questions you have with your health care provider.   Document Released: 04/23/2009 Document Revised: 12/10/2013 Document Reviewed: 08/12/2013 Elsevier Interactive Patient Education [employer].

## 2016-10-13 NOTE — Telephone Encounter (Signed)
Pt is calling back with her medications she is taking. Please ca.Marland Kitchen

## 2016-10-13 NOTE — Telephone Encounter (Signed)
Patient called in to review what medications she is taking at home. When she was here earlier seeing Dr. Yvone Neu she was not clear on her medications when I went to review instructions for her stress test. She reviewed all medications and states that she does not know what hydrochlorothiazide is and doesn't remember taking that. She stated that she does not recall taking this within the past 30 days. Updated medication list and discontinued that per patient. Reviewed instructions for stress testing and she had no further questions at this time. She was appreciative that I reviewed medications with her. Instructed her to call back if she has any further questions.

## 2016-10-13 NOTE — Progress Notes (Signed)
Cardiology Office Note   Date:  10/13/2016   ID:  [name], DOB 10/24/1966, MRN [id number]  Referring Doctor:  Maren Reamer, MD   Cardiologist:   Wende Bushy, MD   Reason for consultation:  Chief Complaint  Patient presents with  . other    Cardiac clearance for biariatric surgery. Meds reviewed verbally with pt.      History of Present Illness: Mary [name] is a 50 y.o. female who presents for Preoperative cardiac evaluation prior to bariatric surgery.  Patient denies chest pain. Due to previous stroke, she has some difficulty with walking. She does report some shortness of breath with walking. Symptom is mild, lasting a few minutes resolving with rest. Symptoms mainly in the chest nonradiating.  Patient has an implantable loop recorder and is being followed by Dr. Rayann Heman. This is placed due to her history of cryptogenic stroke.  Patient denies PND, orthopnea, edema. No loss of consciousness, no palpitations, no abdominal pain.   ROS:  Please see the history of present illness. Aside from mentioned under HPI, all other systems are reviewed and negative.     Past Medical History:  Diagnosis Date  . Blood transfusion without reported diagnosis    transfusion December 2016  . Clotting disorder (HCC)    left leg  . Diabetes mellitus without complication ([place])   . Hypertension   . Other and unspecified ovarian cysts   . Stroke [facility])     Past Surgical History:  Procedure Laterality Date  . COLONOSCOPY WITH PROPOFOL N/A 11/10/2015   Procedure: COLONOSCOPY WITH PROPOFOL;  Surgeon: Lucilla Lame, MD;  Location: [facility] ENDOSCOPY;  Service: Endoscopy;  Laterality: N/A;  . ESOPHAGOGASTRODUODENOSCOPY (EGD) WITH PROPOFOL N/A 11/10/2015   Procedure: ESOPHAGOGASTRODUODENOSCOPY (EGD) WITH PROPOFOL;  Surgeon: Lucilla Lame, MD;  Location: [facility] ENDOSCOPY;  Service: Endoscopy;  Laterality: N/A;  . LOOP RECORDER IMPLANT N/A 04/25/2014   Procedure: LOOP RECORDER IMPLANT;   Surgeon: Coralyn Mark, MD;  Location: [place] CATH LAB;  Service: Cardiovascular;  Laterality: N/A;  . OVARIAN CYST REMOVAL    . TEE WITHOUT CARDIOVERSION N/A 01/25/2013   Procedure: TRANSESOPHAGEAL ECHOCARDIOGRAM (TEE);  Surgeon: Birdie Riddle, MD;  Location: [facility] ENDOSCOPY;  Service: Cardiovascular;  Laterality: N/A;     reports that she quit smoking about 10 years ago. Her smoking use included Cigarettes. She has a 10.00 pack-year smoking history. She has never used smokeless tobacco. She reports that she does not drink alcohol or use drugs.   family history includes Diabetes in her mother, other, and sister; Diabetes Mellitus II in her mother; Stroke in her father, mother, and other; Transient ischemic attack in her father.   Outpatient Medications Prior to Visit  Medication Sig Dispense Refill  . amLODipine (NORVASC) 10 MG tablet Take 1 tablet (10 mg total) by mouth daily. 90 tablet 3  . ferrous gluconate (FERGON) 324 MG tablet Take 1 tablet (324 mg total) by mouth daily with breakfast. 90 tablet 3  . hydrochlorothiazide (HYDRODIURIL) 25 MG tablet Take 1 tablet (25 mg total) by mouth daily. 90 tablet 3  . metFORMIN (GLUCOPHAGE) 500 MG tablet Take 1 tablet (500 mg total) by mouth 2 (two) times daily with a meal. 180 tablet 3  . metoprolol tartrate (LOPRESSOR) 25 MG tablet Take 1 tablet (25 mg total) by mouth 2 (two) times daily. 180 tablet 3  . warfarin (COUMADIN) 5 MG tablet TAKE AS DIRECTED BY  COUMADIN  CLINIC 90 tablet 1  No facility-administered medications prior to visit.      Allergies: Review of patient's allergies indicates no known allergies.    PHYSICAL EXAM: VS:  BP 114/72 (BP Location: Right Arm, Patient Position: Sitting, Cuff Size: Large)   Pulse 67   Ht 5\' 5"  (1.651 m)   Wt 296 lb 8 oz (134.5 kg)   BMI 49.34 kg/m  , Body mass index is 49.34 kg/m. Wt Readings from Last 3 Encounters:  10/13/16 296 lb 8 oz (134.5 kg)  07/14/16 294 lb 3.2 oz (133.4 kg)  07/07/16 294 lb  (133.4 kg)    GENERAL:  well developed, well nourished, Morbidly obese, not in acute distress HEENT: normocephalic, pink conjunctivae, anicteric sclerae, no xanthelasma, normal dentition, oropharynx clear NECK:  no neck vein engorgement, JVP normal, no hepatojugular reflux, carotid upstroke brisk and symmetric, no bruit, no thyromegaly, no lymphadenopathy LUNGS:  good respiratory effort, clear to auscultation bilaterally CV:  PMI not displaced, no thrills, no lifts, S1 and S2 within normal limits, no palpable S3 or S4, soft systolic murmur, no rubs, no gallops ABD:  Soft, nontender, nondistended, normoactive bowel sounds, no abdominal aortic bruit, no hepatomegaly, no splenomegaly MS: nontender back, no kyphosis, no scoliosis, no joint deformities EXT:  2+ DP/PT pulses, no edema, no varicosities, no cyanosis, no clubbing SKIN: warm, nondiaphoretic, normal turgor, no ulcers NEUROPSYCH: alert, oriented to person, place, and time, sensory/motor grossly intact, normal mood, appropriate affect  Recent Labs: 05/09/2016: ALT 14; BUN 8; Creat 0.71; Hemoglobin 10.4; Magnesium 1.8; Platelets 237; Potassium 3.5; Sodium 143; TSH 1.16   Lipid Panel    Component Value Date/Time   CHOL 128 07/22/2015 0944   TRIG 133 07/22/2015 0944   HDL 36 (L) 07/22/2015 0944   CHOLHDL 3.6 07/22/2015 0944   VLDL 27 07/22/2015 0944   LDLCALC 65 07/22/2015 0944     Other studies Reviewed:  EKG:  The ekg from 10/13/2016 was personally reviewed by me and it revealed sinus rhythm, 67 BPM. Low-voltage QRS.  Additional studies/ records that were reviewed personally reviewed by me today include: None available.   ASSESSMENT AND PLAN:  Preoperative cardiac evaluation Shortness of breath Systolic murmur Recommend evaluation with echocardiogram and pharmacologic nuclear stress test. Patient unable to walk on the treadmill due to residual weakness from previous stroke. If echo is unremarkable and no evidence of  ischemia on stress test, patient's cardiac risk will be intermediate cardiac risk for moderate risk procedure.   HTN BP is well controlled. Continue monitoring BP. Continue current medical therapy and lifestyle changes.  History of stroke, ILR in place Being followed by Dr. Rayann Heman. No evidence of arrhythmia on reports.  Review of medical history states sleep apnea Patient remembers having a sleep study done but is unaware of the results. Reviewing chart, it appears that pulmonary sleep department tried contacting her several times but was unable to get hold of her. We will direct her to the pulmonary sleep medicine office.  Current medicines are reviewed at length with the patient today.  The patient does not have concerns regarding medicines.  Labs/ tests ordered today include:  Orders Placed This Encounter  Procedures  . NM Myocar Multi W/Spect W/Wall Motion / EF  . EKG 12-Lead  . ECHOCARDIOGRAM COMPLETE    I counseled the patient on importance of lifestyle modification including heart healthy diet, regular physical activity.   Disposition:   FU with undersigned after tests prn   Signed, Wende Bushy, MD  10/13/2016 11:08 AM  [place]  This note was generated in part with voice recognition software and I apologize for any typographical errors that were not detected and corrected.

## 2016-10-14 ENCOUNTER — Other Ambulatory Visit: Payer: Self-pay

## 2016-10-14 ENCOUNTER — Ambulatory Visit (INDEPENDENT_AMBULATORY_CARE_PROVIDER_SITE_OTHER): Payer: Medicare Other

## 2016-10-14 DIAGNOSIS — I10 Essential (primary) hypertension: Secondary | ICD-10-CM

## 2016-10-14 DIAGNOSIS — R0602 Shortness of breath: Secondary | ICD-10-CM

## 2016-10-14 DIAGNOSIS — Z01818 Encounter for other preprocedural examination: Secondary | ICD-10-CM | POA: Diagnosis not present

## 2016-10-17 ENCOUNTER — Telehealth: Payer: Self-pay

## 2016-10-17 NOTE — Progress Notes (Signed)
Carelink Summary Report / Loop Recorder 

## 2016-10-17 NOTE — Telephone Encounter (Signed)
Tried to call patient and schedule Sleep consult with Pulmonary per Dr Yvone Neu for her obstructive sleep apnea Will try again later.

## 2016-10-18 MED FILL — ?METOPROLOL 25 MG TABLET: 25 | 30 days supply | Qty: 60 | Fill #1

## 2016-10-19 ENCOUNTER — Other Ambulatory Visit
Admission: RE | Admit: 2016-10-19 | Discharge: 2016-10-19 | Disposition: A | Discharge status: Home / Self Care | Payer: Medicare Other | Source: Ambulatory Visit | Attending: Cardiology | Admitting: Cardiology

## 2016-10-19 ENCOUNTER — Other Ambulatory Visit: Payer: Self-pay | Admitting: *Deleted

## 2016-10-19 ENCOUNTER — Ambulatory Visit (INDEPENDENT_AMBULATORY_CARE_PROVIDER_SITE_OTHER): Payer: Medicare Other

## 2016-10-19 DIAGNOSIS — Z5181 Encounter for therapeutic drug level monitoring: Secondary | ICD-10-CM

## 2016-10-19 DIAGNOSIS — I639 Cerebral infarction, unspecified: Secondary | ICD-10-CM | POA: Diagnosis not present

## 2016-10-19 DIAGNOSIS — Z01812 Encounter for preprocedural laboratory examination: Secondary | ICD-10-CM | POA: Diagnosis not present

## 2016-10-19 DIAGNOSIS — Z01810 Encounter for preprocedural cardiovascular examination: Secondary | ICD-10-CM

## 2016-10-19 LAB — POCT INR: INR: 4.5

## 2016-10-19 LAB — PREGNANCY, URINE: Preg Test, Ur: NEGATIVE

## 2016-10-19 NOTE — Telephone Encounter (Signed)
Attempted to call patient to make appointment with Pulmonary  Will try again later

## 2016-10-19 NOTE — Telephone Encounter (Signed)
-----   Message from Renelda Mom, [place] sent at [id number]  2:58 PM EDT ----- Regarding: FW: Pulmonary appointment Please schedule f/u appt with Dr. Mortimer Fries and put in notes "f/u OSA".  Thanks, Misty ----- Message ----- From: Valora Corporal, RN Sent: 10/13/2016  11:19 AM To: Renelda Mom, LPN, Rudi Coco, # Subject: Pulmonary appointment                          Patient needs follow up appointment for obstructive sleep apnea.   Thanks for your assistance.  Lesleigh Noe RN, BSN [phone]

## 2016-10-20 ENCOUNTER — Encounter
Admission: RE | Admit: 2016-10-20 | Discharge: 2016-10-20 | Disposition: A | Discharge status: Home / Self Care | Payer: Medicare Other | Source: Ambulatory Visit | Attending: Cardiology | Admitting: Cardiology

## 2016-10-20 ENCOUNTER — Telehealth: Payer: Self-pay | Admitting: *Deleted

## 2016-10-20 DIAGNOSIS — Z01818 Encounter for other preprocedural examination: Secondary | ICD-10-CM | POA: Diagnosis not present

## 2016-10-20 DIAGNOSIS — R0602 Shortness of breath: Secondary | ICD-10-CM | POA: Diagnosis not present

## 2016-10-20 DIAGNOSIS — I10 Essential (primary) hypertension: Secondary | ICD-10-CM | POA: Insufficient documentation

## 2016-10-20 MED ORDER — TECHNETIUM TC 99M TETROFOSMIN IV KIT
32.2200 | PACK | Freq: Once | INTRAVENOUS | Status: AC | PRN
  Administered 2016-10-20: 32.22 via INTRAVENOUS

## 2016-10-20 MED ORDER — REGADENOSON 0.4 MG/5ML IV SOLN
0.4000 mg | Freq: Once | INTRAVENOUS | Status: AC
  Administered 2016-10-20: 0.4 mg via INTRAVENOUS

## 2016-10-20 NOTE — Telephone Encounter (Signed)
Attempted to reach patient regarding pause episode on LINQ from 10/04/16.  Phone rang continuously, no VM set up.

## 2016-10-21 ENCOUNTER — Encounter
Admission: RE | Admit: 2016-10-21 | Discharge: 2016-10-21 | Disposition: A | Discharge status: Home / Self Care | Payer: Medicare Other | Source: Ambulatory Visit | Attending: Cardiology | Admitting: Cardiology

## 2016-10-21 DIAGNOSIS — Z01818 Encounter for other preprocedural examination: Secondary | ICD-10-CM | POA: Diagnosis not present

## 2016-10-21 DIAGNOSIS — R0602 Shortness of breath: Secondary | ICD-10-CM | POA: Diagnosis not present

## 2016-10-21 DIAGNOSIS — I10 Essential (primary) hypertension: Secondary | ICD-10-CM | POA: Diagnosis not present

## 2016-10-22 MED ORDER — TECHNETIUM TC 99M TETROFOSMIN IV KIT
32.0400 | PACK | Freq: Once | INTRAVENOUS | Status: AC | PRN
  Administered 2016-10-22: 32.04 via INTRAVENOUS

## 2016-10-24 LAB — NM MYOCAR MULTI W/SPECT W/WALL MOTION / EF
CHL CUP NUCLEAR SDS: 4
LV dias vol: 102 mL (ref 46–106)
LVSYSVOL: 43 mL
NUC STRESS TID: 1.16
Peak HR: 98 {beats}/min
Percent HR: 57 %
Rest HR: 72 {beats}/min
SRS: 10
SSS: 14

## 2016-10-24 NOTE — Telephone Encounter (Signed)
Attempted to call patient to make appointment with Pulmonary  Will try again later   Number kept ringing no vm

## 2016-10-28 NOTE — Telephone Encounter (Signed)
Second attempt:  Attempted to reach patient.  Phone rang continuously, no VM set up.  EC has same phone number listed.

## 2016-11-01 ENCOUNTER — Telehealth: Payer: Self-pay | Admitting: Cardiology

## 2016-11-01 NOTE — Telephone Encounter (Signed)
Results note with cardiac clearance routed to Dr. Duke Salvia through [facility].

## 2016-11-01 NOTE — Telephone Encounter (Signed)
Dr. Bernette Mayers office calling to see status of surgical clearance. Please call.

## 2016-11-01 NOTE — Telephone Encounter (Signed)
Spoke with Seth Bake and let her know that I forwarded cardiac clearance through to Dr. Duke Salvia through epic. She stated that she would let Gabby know and if they need anything else they would call back.

## 2016-11-02 ENCOUNTER — Telehealth: Payer: Self-pay | Admitting: Internal Medicine

## 2016-11-02 ENCOUNTER — Ambulatory Visit (INDEPENDENT_AMBULATORY_CARE_PROVIDER_SITE_OTHER): Payer: Medicare Other

## 2016-11-02 DIAGNOSIS — G4733 Obstructive sleep apnea (adult) (pediatric): Secondary | ICD-10-CM

## 2016-11-02 DIAGNOSIS — Z5181 Encounter for therapeutic drug level monitoring: Secondary | ICD-10-CM

## 2016-11-02 DIAGNOSIS — I639 Cerebral infarction, unspecified: Secondary | ICD-10-CM

## 2016-11-02 LAB — POCT INR: INR: 3

## 2016-11-02 NOTE — Telephone Encounter (Signed)
Please call patient regarding her sleep study results. She needs this asap so she can schedule her surger date.

## 2016-11-02 NOTE — Telephone Encounter (Signed)
Pt informed of sleep study results and CPAP titration ordered. Nothing further needed.

## 2016-11-03 DIAGNOSIS — E119 Type 2 diabetes mellitus without complications: Secondary | ICD-10-CM | POA: Diagnosis not present

## 2016-11-03 DIAGNOSIS — Z713 Dietary counseling and surveillance: Secondary | ICD-10-CM | POA: Diagnosis not present

## 2016-11-04 ENCOUNTER — Ambulatory Visit: Payer: Medicare Other | Attending: Internal Medicine

## 2016-11-04 ENCOUNTER — Encounter: Payer: Self-pay | Admitting: Internal Medicine

## 2016-11-04 DIAGNOSIS — G4733 Obstructive sleep apnea (adult) (pediatric): Secondary | ICD-10-CM | POA: Diagnosis not present

## 2016-11-04 DIAGNOSIS — Z8673 Personal history of transient ischemic attack (TIA), and cerebral infarction without residual deficits: Secondary | ICD-10-CM | POA: Insufficient documentation

## 2016-11-04 DIAGNOSIS — Z6841 Body Mass Index (BMI) 40.0 and over, adult: Secondary | ICD-10-CM | POA: Insufficient documentation

## 2016-11-04 DIAGNOSIS — I10 Essential (primary) hypertension: Secondary | ICD-10-CM | POA: Diagnosis not present

## 2016-11-10 DIAGNOSIS — G4710 Hypersomnia, unspecified: Secondary | ICD-10-CM | POA: Diagnosis not present

## 2016-11-12 LAB — CUP PACEART REMOTE DEVICE CHECK
Date Time Interrogation Session: 20171027033811
MDC IDC PG IMPLANT DT: 20150508

## 2016-11-12 NOTE — Progress Notes (Signed)
Carelink summary report received. Battery status OK. Normal device function. No new symptom episodes, tachy episodes, brady, or pause episodes. No new AF episodes. Monthly summary reports and ROV/PRN 

## 2016-11-14 ENCOUNTER — Ambulatory Visit (INDEPENDENT_AMBULATORY_CARE_PROVIDER_SITE_OTHER): Payer: Medicare Other | Admitting: *Deleted

## 2016-11-14 DIAGNOSIS — I639 Cerebral infarction, unspecified: Secondary | ICD-10-CM | POA: Diagnosis not present

## 2016-11-15 ENCOUNTER — Telehealth: Payer: Self-pay | Admitting: *Deleted

## 2016-11-15 DIAGNOSIS — G4733 Obstructive sleep apnea (adult) (pediatric): Secondary | ICD-10-CM

## 2016-11-15 NOTE — Telephone Encounter (Signed)
Pt informed order will be placed for CPAP machine and that she must f/u in 6-8 weeks. Order placed. Nothing further needed.

## 2016-11-15 NOTE — Telephone Encounter (Signed)
-----   Message from Laverle Hobby, MD sent at 11/10/2016  2:02 PM EST ----- Regarding: titration study results.  Recommendations from her titration study are as follows:   --auto-PAP with pressure range of 14-20, with follow up of download data and clinical symptoms to ensure she is adequately treated.

## 2016-11-16 ENCOUNTER — Encounter: Payer: Self-pay | Admitting: Cardiology

## 2016-11-16 MED FILL — METOPROLOL TARTRATE 25 MG T: 25 | 30 days supply | Qty: 60 | Fill #2

## 2016-11-16 NOTE — Telephone Encounter (Signed)
Sent letter to patient.

## 2016-11-16 NOTE — Progress Notes (Signed)
Carelink Summary Report / Loop Recorder 

## 2016-11-17 NOTE — Telephone Encounter (Signed)
Able to reach patient.  She thinks she was asymptomatic with pause episode on 10/04/16.  Asked patient if she has a better number that she prefers to be reached at, she states she does not have a VM or answering machine and requests that I "keep calling" if I need to reach her.  Advised patient that it would be better if she called the [facility] directly in this case if she experiences any presyncopal or syncopal symptoms.  Educated patient about symptoms and advised that symptom activator does not replace calling 911 in the event of an emergency.  Patient verbalizes understanding of all instructions.  She denies additional questions or concerns at this time.

## 2016-11-23 ENCOUNTER — Telehealth: Payer: Self-pay | Admitting: Internal Medicine

## 2016-11-23 ENCOUNTER — Ambulatory Visit (INDEPENDENT_AMBULATORY_CARE_PROVIDER_SITE_OTHER): Payer: Medicare Other

## 2016-11-23 DIAGNOSIS — I639 Cerebral infarction, unspecified: Secondary | ICD-10-CM

## 2016-11-23 DIAGNOSIS — Z5181 Encounter for therapeutic drug level monitoring: Secondary | ICD-10-CM

## 2016-11-23 LAB — POCT INR: INR: 2.3

## 2016-11-23 NOTE — Telephone Encounter (Signed)
ATC--VM has not been set up. Will try again.

## 2016-11-23 NOTE — Telephone Encounter (Signed)
Patient wants to know about the cpap at her house? Please call patient.

## 2016-11-24 NOTE — Telephone Encounter (Signed)
Pt states she hasn't heard anything from [facility] in regards to cpap set up. Order was placed on 11-15-16 I spoke with Ailene Ravel with [facility], it appears the number that [facility] had on file is not the correct number, I have provided [facility] with the correct number. Ailene Ravel states they will contact pt. Pt is made aware of this and voiced understanding. Nothing further needed.

## 2016-11-24 NOTE — Telephone Encounter (Signed)
Pt returning our call °Please call back ° °

## 2016-11-25 MED FILL — AMLODIPINE BESYLATE 10 MG T: 10 | 30 days supply | Qty: 30 | Fill #2

## 2016-11-29 DIAGNOSIS — F4323 Adjustment disorder with mixed anxiety and depressed mood: Secondary | ICD-10-CM | POA: Diagnosis not present

## 2016-12-07 ENCOUNTER — Telehealth: Payer: Self-pay | Admitting: Internal Medicine

## 2016-12-07 ENCOUNTER — Ambulatory Visit (INDEPENDENT_AMBULATORY_CARE_PROVIDER_SITE_OTHER): Payer: Medicare Other

## 2016-12-07 DIAGNOSIS — I639 Cerebral infarction, unspecified: Secondary | ICD-10-CM

## 2016-12-07 DIAGNOSIS — Z5181 Encounter for therapeutic drug level monitoring: Secondary | ICD-10-CM

## 2016-12-07 LAB — POCT INR: INR: 3.7

## 2016-12-07 NOTE — Telephone Encounter (Signed)
Called [facility] and spoke with Melissa. She states that they have tried to get in touch with the pt several times and have had no success reaching her. I spoke with the pt and made her aware of this. I have given her the number to [facility] to get this taken care of. She was very Patent attorney. Nothing further was needed.

## 2016-12-07 NOTE — Telephone Encounter (Signed)
Patient still hasn't heard from the cpap company. She needs this done stat as she is getting ready for surgery. Please call patient.

## 2016-12-08 ENCOUNTER — Telehealth: Payer: Self-pay | Admitting: Internal Medicine

## 2016-12-09 NOTE — Telephone Encounter (Signed)
Called and spoke with pt and she stated that she has not heard from [facility] about her cpap.  She stated that she was going to call today to see when this was going to be set up for her.    I called and lmom for Corene Cornea from [facility] to call back to give update.  I have sent Melissa a staff message as well.

## 2016-12-13 ENCOUNTER — Ambulatory Visit (INDEPENDENT_AMBULATORY_CARE_PROVIDER_SITE_OTHER): Payer: Medicare Other | Admitting: *Deleted

## 2016-12-13 DIAGNOSIS — I639 Cerebral infarction, unspecified: Secondary | ICD-10-CM | POA: Diagnosis not present

## 2016-12-13 NOTE — Telephone Encounter (Signed)
Please see telephone encounter from 12/07/16. Message will be closed.

## 2016-12-14 NOTE — Progress Notes (Signed)
Carelink Summary Report / Loop Recorder 

## 2016-12-21 ENCOUNTER — Ambulatory Visit (INDEPENDENT_AMBULATORY_CARE_PROVIDER_SITE_OTHER): Payer: Medicare Other | Admitting: *Deleted

## 2016-12-21 DIAGNOSIS — Z5181 Encounter for therapeutic drug level monitoring: Secondary | ICD-10-CM | POA: Diagnosis not present

## 2016-12-21 DIAGNOSIS — I639 Cerebral infarction, unspecified: Secondary | ICD-10-CM | POA: Diagnosis not present

## 2016-12-21 LAB — POCT INR: INR: 2.7

## 2016-12-23 ENCOUNTER — Telehealth: Payer: Self-pay | Admitting: Internal Medicine

## 2016-12-23 LAB — CUP PACEART REMOTE DEVICE CHECK
Implantable Pulse Generator Implant Date: 20150508
MDC IDC SESS DTM: 20171126040622

## 2016-12-23 NOTE — Telephone Encounter (Signed)
CPAP study has been completed by patient and is waiting on physician to read per Mendel Ryder at Sleep Med. Rhonda J Cobb

## 2016-12-26 NOTE — Telephone Encounter (Signed)
Waiting on sleep study to be read. Please advise once read. Thanks.

## 2017-01-02 NOTE — Telephone Encounter (Signed)
I am in the ICU all week, so will probably not be read until early next week.

## 2017-01-03 NOTE — Telephone Encounter (Signed)
Spoke with pt and she states that she has been to the sleep lab 2 times. States she has a class  On 01-17-17 from 1-3pm at [place]. Not sure what pt is referring too.  Spoke with Ria Comment and she states pt is suppose to meet with the resp therapist to understand how to use the machine. Informed Ria Comment DR will be reading the study.   Called pt to update her. Nothing further needed.

## 2017-01-11 ENCOUNTER — Ambulatory Visit (INDEPENDENT_AMBULATORY_CARE_PROVIDER_SITE_OTHER): Payer: Medicare Other | Admitting: *Deleted

## 2017-01-11 DIAGNOSIS — I639 Cerebral infarction, unspecified: Secondary | ICD-10-CM

## 2017-01-12 NOTE — Progress Notes (Signed)
Carelink Summary Report / Loop Recorder 

## 2017-01-18 ENCOUNTER — Ambulatory Visit (INDEPENDENT_AMBULATORY_CARE_PROVIDER_SITE_OTHER): Payer: Medicare Other

## 2017-01-18 DIAGNOSIS — Z5181 Encounter for therapeutic drug level monitoring: Secondary | ICD-10-CM | POA: Diagnosis not present

## 2017-01-18 DIAGNOSIS — I639 Cerebral infarction, unspecified: Secondary | ICD-10-CM

## 2017-01-18 LAB — POCT INR: INR: 2.2

## 2017-01-18 MED ORDER — WARFARIN SODIUM 5 MG PO TABS: ORAL_TABLET | ORAL | 1 refills | Status: DC

## 2017-01-19 MED FILL — ?AMLODIPINE BESYLATE 10 MG: 10 | 30 days supply | Qty: 30 | Fill #3

## 2017-01-24 MED FILL — METOPROLOL TARTRATE 25 MG T: 25 | 30 days supply | Qty: 60 | Fill #3 | Status: TO

## 2017-01-25 DIAGNOSIS — E119 Type 2 diabetes mellitus without complications: Secondary | ICD-10-CM | POA: Diagnosis not present

## 2017-01-30 ENCOUNTER — Encounter: Payer: Self-pay | Admitting: Internal Medicine

## 2017-01-30 ENCOUNTER — Ambulatory Visit: Payer: Medicare Other | Attending: Internal Medicine | Admitting: Internal Medicine

## 2017-01-30 VITALS — BP 151/80 | HR 64 | Temp 98.1°F | Resp 16 | Wt 293.0 lb

## 2017-01-30 DIAGNOSIS — R7303 Prediabetes: Secondary | ICD-10-CM | POA: Diagnosis not present

## 2017-01-30 DIAGNOSIS — I639 Cerebral infarction, unspecified: Secondary | ICD-10-CM

## 2017-01-30 DIAGNOSIS — Z23 Encounter for immunization: Secondary | ICD-10-CM | POA: Diagnosis not present

## 2017-01-30 DIAGNOSIS — Z9989 Dependence on other enabling machines and devices: Secondary | ICD-10-CM

## 2017-01-30 DIAGNOSIS — I10 Essential (primary) hypertension: Secondary | ICD-10-CM | POA: Diagnosis not present

## 2017-01-30 DIAGNOSIS — Z1239 Encounter for other screening for malignant neoplasm of breast: Secondary | ICD-10-CM

## 2017-01-30 DIAGNOSIS — Z6841 Body Mass Index (BMI) 40.0 and over, adult: Secondary | ICD-10-CM | POA: Diagnosis not present

## 2017-01-30 DIAGNOSIS — D509 Iron deficiency anemia, unspecified: Secondary | ICD-10-CM

## 2017-01-30 DIAGNOSIS — E559 Vitamin D deficiency, unspecified: Secondary | ICD-10-CM | POA: Diagnosis not present

## 2017-01-30 DIAGNOSIS — F419 Anxiety disorder, unspecified: Secondary | ICD-10-CM | POA: Insufficient documentation

## 2017-01-30 DIAGNOSIS — G4733 Obstructive sleep apnea (adult) (pediatric): Secondary | ICD-10-CM | POA: Diagnosis not present

## 2017-01-30 DIAGNOSIS — Z8673 Personal history of transient ischemic attack (TIA), and cerebral infarction without residual deficits: Secondary | ICD-10-CM | POA: Diagnosis not present

## 2017-01-30 DIAGNOSIS — Z1231 Encounter for screening mammogram for malignant neoplasm of breast: Secondary | ICD-10-CM | POA: Diagnosis not present

## 2017-01-30 DIAGNOSIS — Z79899 Other long term (current) drug therapy: Secondary | ICD-10-CM | POA: Insufficient documentation

## 2017-01-30 DIAGNOSIS — Z7901 Long term (current) use of anticoagulants: Secondary | ICD-10-CM | POA: Diagnosis not present

## 2017-01-30 LAB — BASIC METABOLIC PANEL WITH GFR
BUN: 12 mg/dL (ref 7–25)
CALCIUM: 9 mg/dL (ref 8.6–10.4)
CO2: 28 mmol/L (ref 20–31)
Chloride: 107 mmol/L (ref 98–110)
Creat: 0.75 mg/dL (ref 0.50–1.05)
Glucose, Bld: 87 mg/dL (ref 65–99)
Potassium: 3.7 mmol/L (ref 3.5–5.3)
Sodium: 142 mmol/L (ref 135–146)

## 2017-01-30 LAB — CBC WITH DIFFERENTIAL/PLATELET
BASOS PCT: 1 %
Basophils Absolute: 52 cells/uL (ref 0–200)
EOS PCT: 2 %
Eosinophils Absolute: 104 cells/uL (ref 15–500)
HCT: 35.3 % (ref 35.0–45.0)
Hemoglobin: 11.1 g/dL — ABNORMAL LOW (ref 11.7–15.5)
LYMPHS ABS: 1924 {cells}/uL (ref 850–3900)
LYMPHS PCT: 37 %
MCH: 21.6 pg — ABNORMAL LOW (ref 27.0–33.0)
MCHC: 31.4 g/dL — AB (ref 32.0–36.0)
MCV: 68.8 fL — ABNORMAL LOW (ref 80.0–100.0)
Monocytes Absolute: 260 cells/uL (ref 200–950)
Monocytes Relative: 5 %
Neutro Abs: 2860 cells/uL (ref 1500–7800)
Neutrophils Relative %: 55 %
Platelets: 312 10*3/uL (ref 140–400)
RBC: 5.13 MIL/uL — AB (ref 3.80–5.10)
RDW: 18.3 % — AB (ref 11.0–15.0)
WBC: 5.2 10*3/uL (ref 3.8–10.8)

## 2017-01-30 LAB — CUP PACEART REMOTE DEVICE CHECK
Date Time Interrogation Session: 20171226044100
Implantable Pulse Generator Implant Date: 20150508

## 2017-01-30 LAB — GLUCOSE, POCT (MANUAL RESULT ENTRY): POC GLUCOSE: 91 mg/dL (ref 70–99)

## 2017-01-30 LAB — POCT GLYCOSYLATED HEMOGLOBIN (HGB A1C): Hemoglobin A1C: 5.7

## 2017-01-30 NOTE — Patient Instructions (Addendum)
DASH Eating Plan DASH stands for "Dietary Approaches to Stop Hypertension." The DASH eating plan is a healthy eating plan that has been shown to reduce high blood pressure (hypertension). Additional health benefits may include reducing the risk of type 2 diabetes mellitus, heart disease, and stroke. The DASH eating plan may also help with weight loss. What do I need to know about the DASH eating plan? For the DASH eating plan, you will follow these general guidelines:  Choose foods with less than 150 milligrams of sodium per serving (as listed on the food label).  Use salt-free seasonings or herbs instead of table salt or sea salt.  Check with your health care provider or pharmacist before using salt substitutes.  Eat lower-sodium products. These are often labeled as "low-sodium" or "no salt added."  Eat fresh foods. Avoid eating a lot of canned foods.  Eat more vegetables, fruits, and low-fat dairy products.  Choose whole grains. Look for the word "whole" as the first word in the ingredient list.  Choose fish and skinless chicken or [place] more often than red meat. Limit fish, poultry, and meat to 6 oz (170 g) each day.  Limit sweets, desserts, sugars, and sugary drinks.  Choose heart-healthy fats.  Eat more home-cooked food and less restaurant, buffet, and fast food.  Limit fried foods.  Do not fry foods. Cook foods using methods such as baking, boiling, grilling, and broiling instead.  When eating at a restaurant, ask that your food be prepared with less salt, or no salt if possible. What foods can I eat? Seek help from a dietitian for individual calorie needs. Grains  Whole grain or whole wheat bread. Brown rice. Whole grain or whole wheat pasta. Quinoa, bulgur, and whole grain cereals. Low-sodium cereals. Corn or whole wheat flour tortillas. Whole grain cornbread. Whole grain crackers. Low-sodium crackers. Vegetables  Fresh or frozen vegetables (raw, steamed, roasted, or  grilled). Low-sodium or reduced-sodium tomato and vegetable juices. Low-sodium or reduced-sodium tomato sauce and paste. Low-sodium or reduced-sodium canned vegetables. Fruits  All fresh, canned (in natural juice), or frozen fruits. Meat and Other Protein Products  Ground beef (85% or leaner), grass-fed beef, or beef trimmed of fat. Skinless chicken or [place]. Ground chicken or [place]. Pork trimmed of fat. All fish and seafood. Eggs. Dried beans, peas, or lentils. Unsalted nuts and seeds. Unsalted canned beans. Dairy  Low-fat dairy products, such as skim or 1% milk, 2% or reduced-fat cheeses, low-fat ricotta or cottage cheese, or plain low-fat yogurt. Low-sodium or reduced-sodium cheeses. Fats and Oils  Tub margarines without trans fats. Light or reduced-fat mayonnaise and salad dressings (reduced sodium). Avocado. Safflower, olive, or canola oils. Natural peanut or almond butter. Other  Unsalted popcorn and pretzels. The items listed above may not be a complete list of recommended foods or beverages. Contact your dietitian for more options.  What foods are not recommended? Grains  White bread. White pasta. White rice. Refined cornbread. Bagels and croissants. Crackers that contain trans fat. Vegetables  Creamed or fried vegetables. Vegetables in a cheese sauce. Regular canned vegetables. Regular canned tomato sauce and paste. Regular tomato and vegetable juices. Fruits  Canned fruit in light or heavy syrup. Fruit juice. Meat and Other Protein Products  Fatty cuts of meat. Ribs, chicken wings, bacon, sausage, bologna, salami, chitterlings, fatback, hot dogs, bratwurst, and packaged luncheon meats. Salted nuts and seeds. Canned beans with salt. Dairy  Whole or 2% milk, cream, half-and-half, and cream cheese. Whole-fat or sweetened yogurt. Full-fat cheeses  or blue cheese. Nondairy creamers and whipped toppings. Processed cheese, cheese spreads, or cheese curds. Condiments  Onion and garlic  salt, seasoned salt, table salt, and sea salt. Canned and packaged gravies. Worcestershire sauce. Tartar sauce. Barbecue sauce. Teriyaki sauce. Soy sauce, including reduced sodium. Steak sauce. Fish sauce. Oyster sauce. Cocktail sauce. Horseradish. Ketchup and mustard. Meat flavorings and tenderizers. Bouillon cubes. Hot sauce. Tabasco sauce. Marinades. Taco seasonings. Relishes. Fats and Oils  Butter, stick margarine, lard, shortening, ghee, and bacon fat. Coconut, palm kernel, or palm oils. Regular salad dressings. Other  Pickles and olives. Salted popcorn and pretzels. The items listed above may not be a complete list of foods and beverages to avoid. Contact your dietitian for more information.  Where can I find more information? National Heart, Lung, and Blood Institute: travelstabloid.com This information is not intended to replace advice given to you by your health care provider. Make sure you discuss any questions you have with your health care provider. Document Released: 11/24/2011 Document Revised: 05/12/2016 Document Reviewed: 10/09/2013 Elsevier Interactive Patient Education  2017 [employer] Inc[employer]  -  Hypertension Hypertension is another name for high blood pressure. High blood pressure forces your heart to work harder to pump blood. A blood pressure reading has two numbers, which includes a higher number over a lower number (example: 110/72). Follow these instructions at home:  Have your blood pressure rechecked by your doctor.  Only take medicine as told by your doctor. Follow the directions carefully. The medicine does not work as well if you skip doses. Skipping doses also puts you at risk for problems.  Do not smoke.  Monitor your blood pressure at home as told by your doctor. Contact a doctor if:  You think you are having a reaction to the medicine you are taking.  You have repeat headaches or feel dizzy.  You have puffiness (swelling)  in your ankles.  You have trouble with your vision. Get help right away if:  You get a very bad headache and are confused.  You feel weak, numb, or faint.  You get chest or belly (abdominal) pain.  You throw up (vomit).  You cannot breathe very well. This information is not intended to replace advice given to you by your health care provider. Make sure you discuss any questions you have with your health care provider. Document Released: 05/23/2008 Document Revised: 05/12/2016 Document Reviewed: 09/27/2013 Elsevier Interactive Patient Education  2017 [place] Maintenance, Female Introduction Adopting a healthy lifestyle and getting preventive care can go a long way to promote health and wellness. Talk with your health care provider about what schedule of regular examinations is right for you. This is a good chance for you to check in with your provider about disease prevention and staying healthy. In between checkups, there are plenty of things you can do on your own. Experts have done a lot of research about which lifestyle changes and preventive measures are most likely to keep you healthy. Ask your health care provider for more information. Weight and diet Eat a healthy diet  Be sure to include plenty of vegetables, fruits, low-fat dairy products, and lean protein.  Do not eat a lot of foods high in solid fats, added sugars, or salt.  Get regular exercise. This is one of the most important things you can do for your health.  Most adults should exercise for at least 150 minutes each week. The exercise should increase your heart rate and make you  sweat (moderate-intensity exercise).  Most adults should also do strengthening exercises at least twice a week. This is in addition to the moderate-intensity exercise. Maintain a healthy weight  Body mass index (BMI) is a measurement that can be used to identify possible weight problems. It estimates body fat based on  height and weight. Your health care provider can help determine your BMI and help you achieve or maintain a healthy weight.  For females 31 years of age and older:  A BMI below 18.5 is considered underweight.  A BMI of 18.5 to 24.9 is normal.  A BMI of 25 to 29.9 is considered overweight.  A BMI of 30 and above is considered obese. Watch levels of cholesterol and blood lipids  You should start having your blood tested for lipids and cholesterol at 52 years of age, then have this test every 5 years.  You may need to have your cholesterol levels checked more often if:  Your lipid or cholesterol levels are high.  You are older than 52 years of age.  You are at high risk for heart disease. Cancer screening Lung Cancer  Lung cancer screening is recommended for adults 74-50 years old who are at high risk for lung cancer because of a history of smoking.  A yearly low-dose CT scan of the lungs is recommended for people who:  Currently smoke.  Have quit within the past 15 years.  Have at least a 30-pack-year history of smoking. A pack year is smoking an average of one pack of cigarettes a day for 1 year.  Yearly screening should continue until it has been 15 years since you quit.  Yearly screening should stop if you develop a health problem that would prevent you from having lung cancer treatment. Breast Cancer  Practice breast self-awareness. This means understanding how your breasts normally appear and feel.  It also means doing regular breast self-exams. Let your health care provider know about any changes, no matter how small.  If you are in your 20s or 30s, you should have a clinical breast exam (CBE) by a health care provider every 1-3 years as part of a regular health exam.  If you are 18 or older, have a CBE every year. Also consider having a breast X-ray (mammogram) every year.  If you have a family history of breast cancer, talk to your health care provider about  genetic screening.  If you are at high risk for breast cancer, talk to your health care provider about having an MRI and a mammogram every year.  Breast cancer gene (BRCA) assessment is recommended for women who have family members with BRCA-related cancers. BRCA-related cancers include:  Breast.  Ovarian.  Tubal.  Peritoneal cancers.  Results of the assessment will determine the need for genetic counseling and BRCA1 and BRCA2 testing. Cervical Cancer  Your health care provider may recommend that you be screened regularly for cancer of the pelvic organs (ovaries, uterus, and vagina). This screening involves a pelvic examination, including checking for microscopic changes to the surface of your cervix (Pap test). You may be encouraged to have this screening done every 3 years, beginning at age 54.  For women ages 58-65, health care providers may recommend pelvic exams and Pap testing every 3 years, or they may recommend the Pap and pelvic exam, combined with testing for human papilloma virus (HPV), every 5 years. Some types of HPV increase your risk of cervical cancer. Testing for HPV may also be done on women  of any age with unclear Pap test results.  Other health care providers may not recommend any screening for nonpregnant women who are considered low risk for pelvic cancer and who do not have symptoms. Ask your health care provider if a screening pelvic exam is right for you.  If you have had past treatment for cervical cancer or a condition that could lead to cancer, you need Pap tests and screening for cancer for at least 20 years after your treatment. If Pap tests have been discontinued, your risk factors (such as having a new sexual partner) need to be reassessed to determine if screening should resume. Some women have medical problems that increase the chance of getting cervical cancer. In these cases, your health care provider may recommend more frequent screening and Pap  tests. Colorectal Cancer  This type of cancer can be detected and often prevented.  Routine colorectal cancer screening usually begins at 52 years of age and continues through 52 years of age.  Your health care provider may recommend screening at an earlier age if you have risk factors for colon cancer.  Your health care provider may also recommend using home test kits to check for hidden blood in the stool.  A small camera at the end of a tube can be used to examine your colon directly (sigmoidoscopy or colonoscopy). This is done to check for the earliest forms of colorectal cancer.  Routine screening usually begins at age 70.  Direct examination of the colon should be repeated every 5-10 years through 52 years of age. However, you may need to be screened more often if early forms of precancerous polyps or small growths are found. Skin Cancer  Check your skin from head to toe regularly.  Tell your health care provider about any new moles or changes in moles, especially if there is a change in a mole's shape or color.  Also tell your health care provider if you have a mole that is larger than the size of a pencil eraser.  Always use sunscreen. Apply sunscreen liberally and repeatedly throughout the day.  Protect yourself by wearing long sleeves, pants, a wide-brimmed hat, and sunglasses whenever you are outside. Heart disease, diabetes, and high blood pressure  High blood pressure causes heart disease and increases the risk of stroke. High blood pressure is more likely to develop in:  People who have blood pressure in the high end of the normal range (130-139/85-89 mm Hg).  People who are overweight or obese.  People who are African American.  If you are 17-70 years of age, have your blood pressure checked every 3-5 years. If you are 4 years of age or older, have your blood pressure checked every year. You should have your blood pressure measured twice-once when you are at a  hospital or clinic, and once when you are not at a hospital or clinic. Record the average of the two measurements. To check your blood pressure when you are not at a hospital or clinic, you can use:  An automated blood pressure machine at a pharmacy.  A home blood pressure monitor.  If you are between 66 years and 21 years old, ask your health care provider if you should take aspirin to prevent strokes.  Have regular diabetes screenings. This involves taking a blood sample to check your fasting blood sugar level.  If you are at a normal weight and have a low risk for diabetes, have this test once every three years after 52  years of age.  If you are overweight and have a high risk for diabetes, consider being tested at a younger age or more often. Preventing infection Hepatitis B  If you have a higher risk for hepatitis B, you should be screened for this virus. You are considered at high risk for hepatitis B if:  You were born in a country where hepatitis B is common. Ask your health care provider which countries are considered high risk.  Your parents were born in a high-risk country, and you have not been immunized against hepatitis B (hepatitis B vaccine).  You have HIV or AIDS.  You use needles to inject street drugs.  You live with someone who has hepatitis B.  You have had sex with someone who has hepatitis B.  You get hemodialysis treatment.  You take certain medicines for conditions, including cancer, organ transplantation, and autoimmune conditions. Hepatitis C  Blood testing is recommended for:  Everyone born from [birth year] through 1966.  Anyone with known risk factors for hepatitis C. Sexually transmitted infections (STIs)  You should be screened for sexually transmitted infections (STIs) including gonorrhea and chlamydia if:  You are sexually active and are younger than 52 years of age.  You are older than 52 years of age and your health care provider tells you  that you are at risk for this type of infection.  Your sexual activity has changed since you were last screened and you are at an increased risk for chlamydia or gonorrhea. Ask your health care provider if you are at risk.  If you do not have HIV, but are at risk, it may be recommended that you take a prescription medicine daily to prevent HIV infection. This is called pre-exposure prophylaxis (PrEP). You are considered at risk if:  You are sexually active and do not regularly use condoms or know the HIV status of your partner(s).  You take drugs by injection.  You are sexually active with a partner who has HIV. Talk with your health care provider about whether you are at high risk of being infected with HIV. If you choose to begin PrEP, you should first be tested for HIV. You should then be tested every 3 months for as long as you are taking PrEP. Pregnancy  If you are premenopausal and you may become pregnant, ask your health care provider about preconception counseling.  If you may become pregnant, take 400 to 800 micrograms (mcg) of folic acid every day.  If you want to prevent pregnancy, talk to your health care provider about birth control (contraception). Osteoporosis and menopause  Osteoporosis is a disease in which the bones lose minerals and strength with aging. This can result in serious bone fractures. Your risk for osteoporosis can be identified using a bone density scan.  If you are 8 years of age or older, or if you are at risk for osteoporosis and fractures, ask your health care provider if you should be screened.  Ask your health care provider whether you should take a calcium or vitamin D supplement to lower your risk for osteoporosis.  Menopause may have certain physical symptoms and risks.  Hormone replacement therapy may reduce some of these symptoms and risks. Talk to your health care provider about whether hormone replacement therapy is right for you. Follow  these instructions at home:  Schedule regular health, dental, and eye exams.  Stay current with your immunizations.  Do not use any tobacco products including cigarettes, chewing tobacco, or electronic  cigarettes.  If you are pregnant, do not drink alcohol.  If you are breastfeeding, limit how much and how often you drink alcohol.  Limit alcohol intake to no more than 1 drink per day for nonpregnant women. One drink equals 12 ounces of beer, 5 ounces of wine, or 1 ounces of hard liquor.  Do not use street drugs.  Do not share needles.  Ask your health care provider for help if you need support or information about quitting drugs.  Tell your health care provider if you often feel depressed.  Tell your health care provider if you have ever been abused or do not feel safe at home. This information is not intended to replace advice given to you by your health care provider. Make sure you discuss any questions you have with your health care provider. Document Released: 06/20/2011 Document Revised: 05/12/2016 Document Reviewed: 09/08/2015  2017 Elsevier  

## 2017-01-30 NOTE — Progress Notes (Signed)
Mary [name], is a 51 y.o. female  [id number]  [id number]  DOB - 1966/05/15  Chief Complaint  Patient presents with  . Hypertension        Subjective:   Mary [name] is a 51 y.o. female here today for a follow up visit for htn, dm, morbid obesity, hx of cva on coumadin being followed by Cardiology coumadin clinic, osa using her cpap nightly, and morbid obesity. Pt last seen 7/17. Has stopped taking metformin, and is watching her diet more closely.  She notes she ate eggs w/ salt added this am. Bit anxious about her Gastric sleeve surgery that is pending 02/16/17.  Per pt, she has been cleared by Cardiology for surgery.   Patient has No headache, No chest pain, No abdominal pain - No Nausea, No new weakness tingling or numbness, No Cough - SOB.  No problems updated.  ALLERGIES: No Known Allergies  PAST MEDICAL HISTORY: Past Medical History:  Diagnosis Date  . Blood transfusion without reported diagnosis    transfusion December 2016  . Clotting disorder (HCC)    left leg  . Diabetes mellitus without complication ([place])   . Hypertension   . Other and unspecified ovarian cysts   . Stroke [facility])     MEDICATIONS AT HOME: Prior to Admission medications   Medication Sig Start Date End Date Taking? Authorizing Provider  amLODipine (NORVASC) 10 MG tablet Take 1 tablet (10 mg total) by mouth daily. 05/09/16   Maren Reamer, MD  DOCUSATE SODIUM PO Take by mouth daily.    Historical Provider, MD  ferrous gluconate (FERGON) 324 MG tablet Take 1 tablet (324 mg total) by mouth daily with breakfast. 06/27/16   Maren Reamer, MD  metoprolol tartrate (LOPRESSOR) 25 MG tablet Take 1 tablet (25 mg total) by mouth 2 (two) times daily. 03/28/16   Brittainy Erie Noe, PA-C  warfarin (COUMADIN) 5 MG tablet TAKE AS DIRECTED BY  COUMADIN  CLINIC 01/18/17   Thompson Grayer, MD     Objective:   Vitals:   01/30/17 1112  BP: (!) 151/80  Pulse: 64  Resp: 16  Temp: 98.1 F (36.7 C)    TempSrc: Oral  SpO2: 96%  Weight: 293 lb (132.9 kg)    Exam General appearance : Awake, alert, not in any distress. Speech Clear. Not toxic looking, morbid obese, pleasant. HEENT: Atraumatic and Normocephalic, pupils equally reactive to light. Neck: supple, no JVD.   Chest:Good air entry bilaterally, no added sounds. CVS: S1 S2 regular, no murmurs/gallups or rubs. Abdomen: Bowel sounds active,  Obese, Non tender and not distended with no gaurding, rigidity or rebound. Extremities: rom all extrem intact, B/L Lower Ext shows no edema, both legs are warm to touch Neurology: Awake alert, and oriented X 3, CN II-XII grossly intact, Non focal Skin:No Rash  Data Review Lab Results  Component Value Date   HGBA1C 5.7 01/30/2017   HGBA1C 6.5 (H) 05/09/2016   HGBA1C 5.5 02/29/2016    Depression screen PHQ 2/9 01/30/2017 06/27/2016 06/27/2016 05/09/2016 02/29/2016  Decreased Interest 3 0 0 2 0  Down, Depressed, Hopeless 0 0 0 0 0  PHQ - 2 Score 3 0 0 2 0  Altered sleeping 1 1 1  0 -  Tired, decreased energy 1 3 3 2  -  Change in appetite 0 1 1 0 -  Feeling bad or failure about yourself  0 0 0 0 -  Trouble concentrating 1 1 1  0 -  Moving slowly or fidgety/restless  3 1 1  0 -  Suicidal thoughts 0 0 0 0 -  PHQ-9 Score 9 7 7 4  -     Echo 10/14/16 Study Conclusions  - Left ventricle: The cavity size was normal. Systolic function was   normal. The estimated ejection fraction was in the range of 60%   to 65%. Wall motion was normal; there were no regional wall   motion abnormalities. Left ventricular diastolic function   parameters were normal. - Left atrium: The atrium was normal in size. - Right ventricle: Systolic function was normal. - Pulmonary arteries: Systolic pressure was within the normal   range.  Impressions:  - Normal study.   Assessment & Plan   1. Essential hypertension elevated today, on norvasc 10 and metoprolol 25bid -normal bp on 10/13/16 at cardiology, Dr  Yvone Neu appt. - CBC with Differential - BASIC METABOLIC PANEL WITH GFR - DASH diet recd. -anxiety may be contributing.  2. OSA on CPAP Encouraged continued use, she states she is less tired during day now w/ cpap machine.  3. Prediabetes Much better controlled now, on diet control alone, not currently on metformin. - POCT glucose (manual entry)  91 - hb glycosylated hemoglobin (Hb A1C)  5.7  4. Breast cancer screening Due for screening, ordered nearer to her home. - MM Digital Screening; Future  5. Cryptogenic stroke ([place]) On coumadin, defer to coumadin Cards clinic  6. Severe obesity (BMI >= 40) (HCC) Has gastric sleeve surgery pending February 16, 2017  7. Vitamin D deficiency Will rechk levels,  - VITAMIN D 25 Hydroxy (Vit-D Deficiency, Fractures)  8. Iron deficiency anemia, unspecified iron deficiency anemia type On iron daily, rechk cbc  9. tdap today Pt declined flu vac.   Patient have been counseled extensively about nutrition and exercise  Return in about 4 weeks (around 02/27/2017) for papsmear.  The patient was given clear instructions to go to ER or return to medical center if symptoms don't improve, worsen or new problems develop. The patient verbalized understanding. The patient was told to call to get lab results if they haven't heard anything in the next week.   This note has been created with Surveyor, quantity. Any transcriptional errors are unintentional.   Maren Reamer, MD, [place] [facility] [place], [place]   01/30/2017, 11:48 AM

## 2017-01-31 ENCOUNTER — Other Ambulatory Visit: Payer: Self-pay | Admitting: Internal Medicine

## 2017-01-31 LAB — VITAMIN D 25 HYDROXY (VIT D DEFICIENCY, FRACTURES): VIT D 25 HYDROXY: 20 ng/mL — AB (ref 30–100)

## 2017-01-31 MED ORDER — VITAMIN D (ERGOCALCIFEROL) 1.25 MG (50000 UNIT) PO CAPS: 50000.0000 [IU] | ORAL_CAPSULE | ORAL | 0 refills | Status: DC

## 2017-01-31 MED ORDER — FERROUS GLUCONATE 324 (38 FE) MG PO TABS: 324.0000 mg | ORAL_TABLET | Freq: Every day | ORAL | 3 refills | Status: DC

## 2017-01-31 MED FILL — VIT D2 1.25 MG (50,000 UNIT: 1.25 MG | 28 days supply | Qty: 4 | Fill #0

## 2017-02-01 ENCOUNTER — Telehealth: Payer: Self-pay | Admitting: Cardiovascular Disease

## 2017-02-01 NOTE — Telephone Encounter (Signed)
Patient with antiphospholipid syndrome with history of stroke. Will need lovenox bridge while off Coumadin. Hold warfarin 5 days prior to procedure and resume evening of procedure or at direction of surgeon.    Faxed to Dr. Bernette Mayers office.

## 2017-02-01 NOTE — Telephone Encounter (Signed)
Spoke to patient and made aware. She is aware further instruction at next appt on 02/08/17

## 2017-02-01 NOTE — Telephone Encounter (Signed)
Request for surgical clearance:  1. What type of surgery is being performed? Weight loss sleeve  2. When is this surgery scheduled? 02/16/17  3. Are there any medications that need to be held prior to surgery and how long? Coumadin   4. Name of physician performing surgery? Dr. Duke Salvia   5. What is your office phone and fax number? [phone]  Please call patient

## 2017-02-02 DIAGNOSIS — Z01818 Encounter for other preprocedural examination: Secondary | ICD-10-CM | POA: Diagnosis not present

## 2017-02-02 NOTE — Telephone Encounter (Signed)
Tried to fax clearance and it failed 4 times. Called to clarify fax and the message we were given had 2 incorrect numbers in it. Correct fax is [phone]. Clearance sent again.

## 2017-02-08 ENCOUNTER — Ambulatory Visit (INDEPENDENT_AMBULATORY_CARE_PROVIDER_SITE_OTHER): Payer: Medicare Other

## 2017-02-08 DIAGNOSIS — I639 Cerebral infarction, unspecified: Secondary | ICD-10-CM | POA: Diagnosis not present

## 2017-02-08 DIAGNOSIS — Z5181 Encounter for therapeutic drug level monitoring: Secondary | ICD-10-CM

## 2017-02-08 LAB — POCT INR: INR: 2.5

## 2017-02-08 MED ORDER — ENOXAPARIN SODIUM 120 MG/0.8ML ~~LOC~~ SOLN: 120.0000 mg | Freq: Two times a day (BID) | SUBCUTANEOUS | 1 refills | Status: DC

## 2017-02-08 MED FILL — ENOXAPARIN 120 MG/0.8 ML SY: 120 | 10 days supply | Qty: 16 | Fill #0

## 2017-02-08 NOTE — Patient Instructions (Signed)
02/10/17: Last dose of Coumadin.  02/11/17: No Coumadin or Lovenox.  02/12/17: Inject Lovenox 120mg  in the fatty abdominal tissue at least 2 inches from the belly button twice a day about 12 hours apart, 8am and 8pm rotate sites. No Coumadin.  02/13/17: Inject Lovenox in the fatty tissue every 12 hours, 8am and 8pm. No Coumadin.  02/14/17: Inject Lovenox in the fatty tissue every 12 hours, 8am and 8pm. No Coumadin.  02/15/17: Inject Lovenox in the fatty tissue in the morning at 8 am (No PM dose). No Coumadin.  02/16/17: Procedure Day - No Lovenox - Resume Coumadin in the evening or as directed by doctor (take an extra half tablet with usual dose for 2 days then resume normal dose).  02/17/17: Resume Lovenox inject in the fatty tissue every 12 hours and take Coumadin.  02/18/17: Inject Lovenox in the fatty tissue every 12 hours and take Coumadin.  02/19/17: Inject Lovenox in the fatty tissue every 12 hours and take Coumadin.  02/20/17: Inject Lovenox in the fatty tissue every 12 hours and take Coumadin.  02/21/17: Inject Lovenox in the fatty tissue every 12 hours and take Coumadin.  02/22/17: Coumadin appt to check INR.

## 2017-02-10 ENCOUNTER — Ambulatory Visit (INDEPENDENT_AMBULATORY_CARE_PROVIDER_SITE_OTHER): Payer: Medicare Other | Admitting: *Deleted

## 2017-02-10 DIAGNOSIS — I639 Cerebral infarction, unspecified: Secondary | ICD-10-CM | POA: Diagnosis not present

## 2017-02-12 LAB — CUP PACEART REMOTE DEVICE CHECK
Date Time Interrogation Session: 20180125051144
Implantable Pulse Generator Implant Date: 20150508

## 2017-02-14 NOTE — Progress Notes (Signed)
Carelink Summary Report / Loop Recorder 

## 2017-02-16 DIAGNOSIS — Z7984 Long term (current) use of oral hypoglycemic drugs: Secondary | ICD-10-CM | POA: Diagnosis not present

## 2017-02-16 DIAGNOSIS — Z9981 Dependence on supplemental oxygen: Secondary | ICD-10-CM | POA: Diagnosis not present

## 2017-02-16 DIAGNOSIS — Z6841 Body Mass Index (BMI) 40.0 and over, adult: Secondary | ICD-10-CM | POA: Diagnosis not present

## 2017-02-16 DIAGNOSIS — Z87891 Personal history of nicotine dependence: Secondary | ICD-10-CM | POA: Diagnosis not present

## 2017-02-16 DIAGNOSIS — I69359 Hemiplegia and hemiparesis following cerebral infarction affecting unspecified side: Secondary | ICD-10-CM | POA: Diagnosis not present

## 2017-02-16 DIAGNOSIS — E119 Type 2 diabetes mellitus without complications: Secondary | ICD-10-CM | POA: Diagnosis not present

## 2017-02-16 DIAGNOSIS — K449 Diaphragmatic hernia without obstruction or gangrene: Secondary | ICD-10-CM | POA: Diagnosis not present

## 2017-02-16 DIAGNOSIS — J9811 Atelectasis: Secondary | ICD-10-CM | POA: Diagnosis not present

## 2017-02-16 DIAGNOSIS — I10 Essential (primary) hypertension: Secondary | ICD-10-CM | POA: Diagnosis not present

## 2017-02-16 DIAGNOSIS — K219 Gastro-esophageal reflux disease without esophagitis: Secondary | ICD-10-CM | POA: Diagnosis not present

## 2017-02-16 DIAGNOSIS — Z713 Dietary counseling and surveillance: Secondary | ICD-10-CM | POA: Diagnosis not present

## 2017-02-16 DIAGNOSIS — R857 Abnormal histological findings in specimens from digestive organs and abdominal cavity: Secondary | ICD-10-CM | POA: Diagnosis not present

## 2017-02-16 DIAGNOSIS — Z7901 Long term (current) use of anticoagulants: Secondary | ICD-10-CM | POA: Diagnosis not present

## 2017-02-16 DIAGNOSIS — G4730 Sleep apnea, unspecified: Secondary | ICD-10-CM | POA: Diagnosis present

## 2017-02-22 ENCOUNTER — Ambulatory Visit (INDEPENDENT_AMBULATORY_CARE_PROVIDER_SITE_OTHER): Payer: Medicare Other

## 2017-02-22 DIAGNOSIS — Z5181 Encounter for therapeutic drug level monitoring: Secondary | ICD-10-CM | POA: Diagnosis not present

## 2017-02-22 DIAGNOSIS — I639 Cerebral infarction, unspecified: Secondary | ICD-10-CM | POA: Diagnosis not present

## 2017-02-22 LAB — POCT INR: INR: 1.7

## 2017-03-01 ENCOUNTER — Ambulatory Visit (INDEPENDENT_AMBULATORY_CARE_PROVIDER_SITE_OTHER): Payer: Medicare Other

## 2017-03-01 DIAGNOSIS — Z5181 Encounter for therapeutic drug level monitoring: Secondary | ICD-10-CM

## 2017-03-01 DIAGNOSIS — I639 Cerebral infarction, unspecified: Secondary | ICD-10-CM | POA: Diagnosis not present

## 2017-03-01 LAB — POCT INR: INR: 1.6

## 2017-03-03 LAB — CUP PACEART REMOTE DEVICE CHECK
Date Time Interrogation Session: 20180224051102
MDC IDC PG IMPLANT DT: 20150508

## 2017-03-08 ENCOUNTER — Ambulatory Visit (INDEPENDENT_AMBULATORY_CARE_PROVIDER_SITE_OTHER): Payer: Medicare Other

## 2017-03-08 DIAGNOSIS — I639 Cerebral infarction, unspecified: Secondary | ICD-10-CM

## 2017-03-08 DIAGNOSIS — Z5181 Encounter for therapeutic drug level monitoring: Secondary | ICD-10-CM

## 2017-03-08 LAB — POCT INR: INR: 2.6

## 2017-03-09 DIAGNOSIS — E119 Type 2 diabetes mellitus without complications: Secondary | ICD-10-CM | POA: Diagnosis not present

## 2017-03-09 DIAGNOSIS — Z713 Dietary counseling and surveillance: Secondary | ICD-10-CM | POA: Diagnosis not present

## 2017-03-12 ENCOUNTER — Other Ambulatory Visit: Payer: Self-pay | Admitting: Internal Medicine

## 2017-03-12 DIAGNOSIS — I10 Essential (primary) hypertension: Secondary | ICD-10-CM

## 2017-03-13 ENCOUNTER — Ambulatory Visit (INDEPENDENT_AMBULATORY_CARE_PROVIDER_SITE_OTHER): Payer: Medicare Other | Admitting: *Deleted

## 2017-03-13 DIAGNOSIS — I639 Cerebral infarction, unspecified: Secondary | ICD-10-CM

## 2017-03-14 NOTE — Progress Notes (Signed)
Carelink Summary Report / Loop Recorder 

## 2017-03-22 LAB — CUP PACEART REMOTE DEVICE CHECK
Date Time Interrogation Session: 20180326053811
MDC IDC PG IMPLANT DT: 20150508

## 2017-03-29 ENCOUNTER — Ambulatory Visit (INDEPENDENT_AMBULATORY_CARE_PROVIDER_SITE_OTHER): Payer: Medicare Other

## 2017-03-29 DIAGNOSIS — I639 Cerebral infarction, unspecified: Secondary | ICD-10-CM

## 2017-03-29 DIAGNOSIS — Z5181 Encounter for therapeutic drug level monitoring: Secondary | ICD-10-CM

## 2017-03-29 LAB — POCT INR: INR: 3

## 2017-04-12 ENCOUNTER — Ambulatory Visit (INDEPENDENT_AMBULATORY_CARE_PROVIDER_SITE_OTHER): Payer: Medicare Other | Admitting: *Deleted

## 2017-04-12 DIAGNOSIS — I639 Cerebral infarction, unspecified: Secondary | ICD-10-CM | POA: Diagnosis not present

## 2017-04-12 NOTE — Progress Notes (Signed)
Carelink Summary Report / Loop Recorder 

## 2017-04-24 ENCOUNTER — Ambulatory Visit: Payer: Medicare Other | Admitting: Internal Medicine

## 2017-04-25 ENCOUNTER — Encounter: Payer: Self-pay | Admitting: Internal Medicine

## 2017-04-25 ENCOUNTER — Telehealth: Payer: Self-pay

## 2017-04-25 ENCOUNTER — Ambulatory Visit: Payer: Medicare Other | Attending: Internal Medicine | Admitting: Internal Medicine

## 2017-04-25 VITALS — BP 175/90 | HR 60 | Temp 97.6°F | Wt 249.6 lb

## 2017-04-25 DIAGNOSIS — Z79899 Other long term (current) drug therapy: Secondary | ICD-10-CM | POA: Diagnosis not present

## 2017-04-25 DIAGNOSIS — G4730 Sleep apnea, unspecified: Secondary | ICD-10-CM

## 2017-04-25 DIAGNOSIS — Z903 Acquired absence of stomach [part of]: Secondary | ICD-10-CM | POA: Insufficient documentation

## 2017-04-25 DIAGNOSIS — I639 Cerebral infarction, unspecified: Secondary | ICD-10-CM | POA: Diagnosis not present

## 2017-04-25 DIAGNOSIS — Z1239 Encounter for other screening for malignant neoplasm of breast: Secondary | ICD-10-CM

## 2017-04-25 DIAGNOSIS — G4733 Obstructive sleep apnea (adult) (pediatric): Secondary | ICD-10-CM | POA: Insufficient documentation

## 2017-04-25 DIAGNOSIS — D6861 Antiphospholipid syndrome: Secondary | ICD-10-CM | POA: Insufficient documentation

## 2017-04-25 DIAGNOSIS — Z7901 Long term (current) use of anticoagulants: Secondary | ICD-10-CM | POA: Insufficient documentation

## 2017-04-25 DIAGNOSIS — Z8673 Personal history of transient ischemic attack (TIA), and cerebral infarction without residual deficits: Secondary | ICD-10-CM | POA: Diagnosis not present

## 2017-04-25 DIAGNOSIS — Z1231 Encounter for screening mammogram for malignant neoplasm of breast: Secondary | ICD-10-CM | POA: Diagnosis not present

## 2017-04-25 DIAGNOSIS — I10 Essential (primary) hypertension: Secondary | ICD-10-CM | POA: Diagnosis not present

## 2017-04-25 MED ORDER — HYDROCHLOROTHIAZIDE 25 MG PO TABS: 25.0000 mg | ORAL_TABLET | Freq: Every day | ORAL | 3 refills | Status: DC

## 2017-04-25 MED FILL — HYDROCHLOROTHIAZIDE 25 MG T: 25 | 30 days supply | Qty: 30 | Fill #0 | Status: TO

## 2017-04-25 NOTE — Progress Notes (Signed)
Mary [name], is a 51 y.o. female  [id number]  DPO:[id number]  DOB - [birth year]  Chief Complaint  Patient presents with  . Hypertension        Subjective:   Mary [name] is a 51 y.o. female here today for a follow up visit for htn, APL syndrome on chronic coumadin managed by Cardiology, prior cva 2014, and osa.  Last seen 01/30/17.  Since than, she has had gastric sleeve procedure in March and has lost about 50lbs since.  Of note, she use to have a cpap machine but could not afford the monthly $79 rental fee and returned it.  She had [employer] this am, had the eggs/ham w/o biscuit. Does not smoke or drink. Only taking novasc 10 currently. Was taking off bb by cards.  Of note, pt has difficult time at work. She works at [employer] at [employer], but due to her prior stroke, has hard time w/ counting and dealing w/ numbers. Short term memory deficit. Pt asked for letter to help her w/ her.    Patient has No headache, No chest pain, No abdominal pain - No Nausea, No new weakness tingling or numbness, No Cough - SOB.  No problems updated.  ALLERGIES: No Known Allergies  PAST MEDICAL HISTORY: Past Medical History:  Diagnosis Date  . Blood transfusion without reported diagnosis    transfusion December 2016  . Clotting disorder (HCC)    left leg  . Diabetes mellitus without complication ([place])   . Hypertension   . Other and unspecified ovarian cysts   . Stroke [facility])     MEDICATIONS AT HOME: Prior to Admission medications   Medication Sig Start Date End Date Taking? Authorizing Provider  amLODipine (NORVASC) 10 MG tablet Take 1 tablet (10 mg total) by mouth daily. 05/09/16  Yes Dillian Feig T, MD  DOCUSATE SODIUM PO Take by mouth daily.   Yes [provider]  ferrous gluconate (FERGON) 324 MG tablet Take 1 tablet (324 mg total) by mouth daily with breakfast. 01/31/17  Yes Lannis Lichtenwalner T, MD  metoprolol tartrate (LOPRESSOR) 25 MG tablet TAKE ONE TABLET BY MOUTH TWICE  DAILY 03/13/17  Yes Lowen Mansouri T, MD  Vitamin D, Ergocalciferol, (DRISDOL) 50000 units CAPS capsule Take 1 capsule (50,000 Units total) by mouth every 7 (seven) days. 01/31/17  Yes Leesha Veno T, MD  warfarin (COUMADIN) 5 MG tablet TAKE AS DIRECTED BY  COUMADIN  CLINIC 01/18/17  Yes Allred, Jeneen Rinks, MD  hydrochlorothiazide (HYDRODIURIL) 25 MG tablet Take 1 tablet (25 mg total) by mouth daily. 04/25/17   Maren Reamer, MD     Objective:   Vitals:   04/25/17 0843  BP: (!) 175/90  Pulse: 60  Temp: 97.6 F (36.4 C)  TempSrc: Oral  SpO2: 96%  Weight: 249 lb 9.6 oz (113.2 kg)    Exam General appearance : Awake, alert, not in any distress. Speech Clear. Not toxic looking, morbid obese, pleasant. HEENT: Atraumatic and Normocephalic, pupils equally reactive to light. Neck: supple, no JVD.  Chest:Good air entry bilaterally, no added sounds. CVS: S1 S2 regular, no murmurs/gallups or rubs. Abdomen: Bowel sounds active, Non tender, obese, and not distended with no gaurding, rigidity or rebound.  Lap trochar sites healing well.  Extremities: B/L Lower Ext shows no edema, both legs are warm to touch, distal pulses 2+ bilat. Neurology: Awake alert, and oriented X 3, CN II-XII grossly intact, Non focal Skin:No Rash  Data Review Lab Results  Component Value Date  HGBA1C 5.7 01/30/2017   HGBA1C 6.5 (H) 05/09/2016   HGBA1C 5.5 02/29/2016    Depression screen [facility] 2/9 04/25/2017 01/30/2017 06/27/2016 06/27/2016 05/09/2016  Decreased Interest 0 3 0 0 2  Down, Depressed, Hopeless 0 0 0 0 0  PHQ - 2 Score 0 3 0 0 2  Altered sleeping - 1 1 1  0  Tired, decreased energy - 1 3 3 2   Change in appetite - 0 1 1 0  Feeling bad or failure about yourself  - 0 0 0 0  Trouble concentrating - 1 1 1  0  Moving slowly or fidgety/restless - 3 1 1  0  Suicidal thoughts - 0 0 0 0  PHQ-9 Score - 9 7 7 4       Assessment & Plan   1. Hypertension, unspecified type Elevated today.  Salt loaded diet this  am. Cont norvasc, low salt diet advised. Added hctz 25 qd. Judyann Munson RN bp check 2 wk.  If spb >130, dc hctz, and change to prinzide 10-12.5 qd, and f/u pcp in 1 month.  2. History of gastrectomy, in March 2018, gastric sleeve - doing well, recd small frequent meals  3. Breast cancer screening - MM Digital Screening; Future  - due, reminded to get done.  4. osa  - no longer has cpap, will asked CM to see if there is less expensive alternative for pt.   5. Prior cva, cognitive limitations.  - Letter written for patient today.     Patient have been counseled extensively about nutrition and exercise  Return in about 4 weeks (around 05/23/2017) for htn and pap smear.  The patient was given clear instructions to go to ER or return to medical center if symptoms don't improve, worsen or new problems develop. The patient verbalized understanding. The patient was told to call to get lab results if they haven't heard anything in the next week.   This note has been created with Surveyor, quantity. Any transcriptional errors are unintentional.   Maren Reamer, MD, [place] [facility], [place]   04/25/2017, 9:22 AM

## 2017-04-25 NOTE — Telephone Encounter (Signed)
Message received from Dr Janne Napoleon noting that the patient is not able to afford her CPAP.  Call placed to the patient. She said that she returned the machine ot [facility]  because she had been paying $8.11/month rental for the machine but the last bill was $77.98. Informed her that this CM would contact [facility] and inquire about her bill.  Call placed to [facility] - spoke to [place] in billing. She said that the patient has always been chanrged $8.11 /month for the CPAP rental and after 13 months, the machine would be hers.She said that the bill for $77.98 also included supplies that the patient ordered.   Call placed to the patient and informed her of the conversation with [facility]. She said that she was still confused as to why the supplies that were ordered and said  that she did not order any supplies, This CM encouraged her to call [facility] billing to review her account. She said that she would call in the morning and she was very appreciative of the assistance.

## 2017-04-25 NOTE — Patient Instructions (Addendum)
Mary Munson RN - 2 wk bp check  Calcium 1,200mg  /daily  + Vitamin D 5,000 IU daily   --  Low-Sodium Eating Plan Sodium, which is an element that makes up salt, helps you maintain a healthy balance of fluids in your body. Too much sodium can increase your blood pressure and cause fluid and waste to be held in your body. Your health care provider or dietitian may recommend following this plan if you have high blood pressure (hypertension), kidney disease, liver disease, or heart failure. Eating less sodium can help lower your blood pressure, reduce swelling, and protect your heart, liver, and kidneys. What are tips for following this plan? General guidelines   Most people on this plan should limit their sodium intake to 1,500-2,000 mg (milligrams) of sodium each day. Reading food labels   The Nutrition Facts label lists the amount of sodium in one serving of the food. If you eat more than one serving, you must multiply the listed amount of sodium by the number of servings.  Choose foods with less than 140 mg of sodium per serving.  Avoid foods with 300 mg of sodium or more per serving. Shopping   Look for lower-sodium products, often labeled as "low-sodium" or "no salt added."  Always check the sodium content even if foods are labeled as "unsalted" or "no salt added".  Buy fresh foods.  Avoid canned foods and premade or frozen meals.  Avoid canned, cured, or processed meats  Buy breads that have less than 80 mg of sodium per slice. Cooking   Eat more home-cooked food and less restaurant, buffet, and fast food.  Avoid adding salt when cooking. Use salt-free seasonings or herbs instead of table salt or sea salt. Check with your health care provider or pharmacist before using salt substitutes.  Cook with plant-based oils, such as canola, sunflower, or olive oil. Meal planning   When eating at a restaurant, ask that your food be prepared with less salt or no salt, if  possible.  Avoid foods that contain MSG (monosodium glutamate). MSG is sometimes added to [place] food, bouillon, and some canned foods. What foods are recommended? The items listed may not be a complete list. Talk with your dietitian about what dietary choices are best for you. Grains  Low-sodium cereals, including oats, puffed wheat and rice, and shredded wheat. Low-sodium crackers. Unsalted rice. Unsalted pasta. Low-sodium bread. Whole-grain breads and whole-grain pasta. Vegetables  Fresh or frozen vegetables. "No salt added" canned vegetables. "No salt added" tomato sauce and paste. Low-sodium or reduced-sodium tomato and vegetable juice. Fruits  Fresh, frozen, or canned fruit. Fruit juice. Meats and other protein foods  Fresh or frozen (no salt added) meat, poultry, seafood, and fish. Low-sodium canned tuna and salmon. Unsalted nuts. Dried peas, beans, and lentils without added salt. Unsalted canned beans. Eggs. Unsalted nut butters. Dairy  Milk. Soy milk. Cheese that is naturally low in sodium, such as ricotta cheese, fresh mozzarella, or Swiss cheese Low-sodium or reduced-sodium cheese. Cream cheese. Yogurt. Fats and oils  Unsalted butter. Unsalted margarine with no trans fat. Vegetable oils such as canola or olive oils. Seasonings and other foods  Fresh and dried herbs and spices. Salt-free seasonings. Low-sodium mustard and ketchup. Sodium-free salad dressing. Sodium-free light mayonnaise. Fresh or refrigerated horseradish. Lemon juice. Vinegar. Homemade, reduced-sodium, or low-sodium soups. Unsalted popcorn and pretzels. Low-salt or salt-free chips. What foods are not recommended? The items listed may not be a complete list. Talk with your dietitian about  what dietary choices are best for you. Grains  Instant hot cereals. Bread stuffing, pancake, and biscuit mixes. Croutons. Seasoned rice or pasta mixes. Noodle soup cups. Boxed or frozen macaroni and cheese. Regular salted crackers.  Self-rising flour. Vegetables  Sauerkraut, pickled vegetables, and relishes. Olives. [place] fries. Onion rings. Regular canned vegetables (not low-sodium or reduced-sodium). Regular canned tomato sauce and paste (not low-sodium or reduced-sodium). Regular tomato and vegetable juice (not low-sodium or reduced-sodium). Frozen vegetables in sauces. Meats and other protein foods  Meat or fish that is salted, canned, smoked, spiced, or pickled. Bacon, ham, sausage, hotdogs, corned beef, chipped beef, packaged lunch meats, salt pork, jerky, pickled herring, anchovies, regular canned tuna, sardines, salted nuts. Dairy  Processed cheese and cheese spreads. Cheese curds. Blue cheese. Feta cheese. String cheese. Regular cottage cheese. Buttermilk. Canned milk. Fats and oils  Salted butter. Regular margarine. Ghee. Bacon fat. Seasonings and other foods  Onion salt, garlic salt, seasoned salt, table salt, and sea salt. Canned and packaged gravies. Worcestershire sauce. Tartar sauce. Barbecue sauce. Teriyaki sauce. Soy sauce, including reduced-sodium. Steak sauce. Fish sauce. Oyster sauce. Cocktail sauce. Horseradish that you find on the shelf. Regular ketchup and mustard. Meat flavorings and tenderizers. Bouillon cubes. Hot sauce and Tabasco sauce. Premade or packaged marinades. Premade or packaged taco seasonings. Relishes. Regular salad dressings. Salsa. Potato and tortilla chips. Corn chips and puffs. Salted popcorn and pretzels. Canned or dried soups. Pizza. Frozen entrees and pot pies. Summary  Eating less sodium can help lower your blood pressure, reduce swelling, and protect your heart, liver, and kidneys.  Most people on this plan should limit their sodium intake to 1,500-2,000 mg (milligrams) of sodium each day.  Canned, boxed, and frozen foods are high in sodium. Restaurant foods, fast foods, and pizza are also very high in sodium. You also get sodium by adding salt to food.  Try to cook at home,  eat more fresh fruits and vegetables, and eat less fast food, canned, processed, or prepared foods. This information is not intended to replace advice given to you by your health care provider. Make sure you discuss any questions you have with your health care provider. Document Released: 05/27/2002 Document Revised: 11/28/2016 Document Reviewed: 11/28/2016 Elsevier Interactive Patient Education  2017 [employer] Inc.  -  Hypertension Hypertension is another name for high blood pressure. High blood pressure forces your heart to work harder to pump blood. This can cause problems over time. There are two numbers in a blood pressure reading. There is a top number (systolic) over a bottom number (diastolic). It is best to have a blood pressure below 120/80. Healthy choices can help lower your blood pressure. You may need medicine to help lower your blood pressure if:  Your blood pressure cannot be lowered with healthy choices.  Your blood pressure is higher than 130/80. Follow these instructions at home: Eating and drinking   If directed, follow the DASH eating plan. This diet includes:  Filling half of your plate at each meal with fruits and vegetables.  Filling one quarter of your plate at each meal with whole grains. Whole grains include whole wheat pasta, brown rice, and whole grain bread.  Eating or drinking low-fat dairy products, such as skim milk or low-fat yogurt.  Filling one quarter of your plate at each meal with low-fat (lean) proteins. Low-fat proteins include fish, skinless chicken, eggs, beans, and tofu.  Avoiding fatty meat, cured and processed meat, or chicken with skin.  Avoiding premade or processed  food.  Eat less than 1,500 mg of salt (sodium) a day.  Limit alcohol use to no more than 1 drink a day for nonpregnant women and 2 drinks a day for men. One drink equals 12 oz of beer, 5 oz of wine, or 1 oz of hard liquor. Lifestyle   Work with your doctor to stay at a  healthy weight or to lose weight. Ask your doctor what the best weight is for you.  Get at least 30 minutes of exercise that causes your heart to beat faster (aerobic exercise) most days of the week. This may include walking, swimming, or biking.  Get at least 30 minutes of exercise that strengthens your muscles (resistance exercise) at least 3 days a week. This may include lifting weights or pilates.  Do not use any products that contain nicotine or tobacco. This includes cigarettes and e-cigarettes. If you need help quitting, ask your doctor.  Check your blood pressure at home as told by your doctor.  Keep all follow-up visits as told by your doctor. This is important. Medicines   Take over-the-counter and prescription medicines only as told by your doctor. Follow directions carefully.  Do not skip doses of blood pressure medicine. The medicine does not work as well if you skip doses. Skipping doses also puts you at risk for problems.  Ask your doctor about side effects or reactions to medicines that you should watch for. Contact a doctor if:  You think you are having a reaction to the medicine you are taking.  You have headaches that keep coming back (recurring).  You feel dizzy.  You have swelling in your ankles.  You have trouble with your vision. Get help right away if:  You get a very bad headache.  You start to feel confused.  You feel weak or numb.  You feel faint.  You get very bad pain in your:  Chest.  Belly (abdomen).  You throw up (vomit) more than once.  You have trouble breathing. Summary  Hypertension is another name for high blood pressure.  Making healthy choices can help lower blood pressure. If your blood pressure cannot be controlled with healthy choices, you may need to take medicine. This information is not intended to replace advice given to you by your health care provider. Make sure you discuss any questions you have with your health  care provider. Document Released: 05/23/2008 Document Revised: 11/02/2016 Document Reviewed: 11/02/2016 Elsevier Interactive Patient Education  2017 Elsevier Inc.  -  Sleep Apnea Sleep apnea is a condition that affects breathing. People with sleep apnea have moments during sleep when their breathing pauses briefly or gets shallow. Sleep apnea can cause these symptoms:  Trouble staying asleep.  Sleepiness or tiredness during the day.  Irritability.  Loud snoring.  Morning headaches.  Trouble concentrating.  Forgetting things.  Less interest in sex.  Being sleepy for no reason.  Mood swings.  Personality changes.  Depression.  Waking up a lot during the night to pee (urinate).  Dry mouth.  Sore throat. Follow these instructions at home:  Make any changes in your routine that your doctor recommends.  Eat a healthy, well-balanced diet.  Take over-the-counter and prescription medicines only as told by your doctor.  Avoid using alcohol, calming medicines (sedatives), and narcotic medicines.  Take steps to lose weight if you are overweight.  If you were given a machine (device) to use while you sleep, use it only as told by your doctor.  Do  not use any tobacco products, such as cigarettes, chewing tobacco, and e-cigarettes. If you need help quitting, ask your doctor.  Keep all follow-up visits as told by your doctor. This is important. Contact a doctor if:  The machine that you were given to use during sleep is uncomfortable or does not seem to be working.  Your symptoms do not get better.  Your symptoms get worse. Get help right away if:  Your chest hurts.  You have trouble breathing in enough air (shortness of breath).  You have an uncomfortable feeling in your back, arms, or stomach.  You have trouble talking.  One side of your body feels weak.  A part of your face is hanging down (drooping). These symptoms may be an emergency. Do not wait to  see if the symptoms will go away. Get medical help right away. Call your local emergency services (911 in the [place] Do not drive yourself to the hospital. This information is not intended to replace advice given to you by your health care provider. Make sure you discuss any questions you have with your health care provider. Document Released: 09/13/2008 Document Revised: 07/31/2016 Document Reviewed: 09/14/2015 Elsevier Interactive Patient Education  2017 [employer].

## 2017-04-26 ENCOUNTER — Ambulatory Visit (INDEPENDENT_AMBULATORY_CARE_PROVIDER_SITE_OTHER): Payer: Medicare Other

## 2017-04-26 DIAGNOSIS — I639 Cerebral infarction, unspecified: Secondary | ICD-10-CM

## 2017-04-26 DIAGNOSIS — Z5181 Encounter for therapeutic drug level monitoring: Secondary | ICD-10-CM | POA: Diagnosis not present

## 2017-04-26 LAB — POCT INR: INR: 2.3

## 2017-05-03 LAB — CUP PACEART REMOTE DEVICE CHECK
Date Time Interrogation Session: 20180425060812
MDC IDC PG IMPLANT DT: 20150508

## 2017-05-04 ENCOUNTER — Encounter: Payer: Self-pay | Admitting: Internal Medicine

## 2017-05-09 ENCOUNTER — Ambulatory Visit: Payer: Medicare Other | Attending: Internal Medicine | Admitting: *Deleted

## 2017-05-09 VITALS — BP 128/80 | HR 58

## 2017-05-09 DIAGNOSIS — R42 Dizziness and giddiness: Secondary | ICD-10-CM | POA: Insufficient documentation

## 2017-05-09 DIAGNOSIS — Z0130 Encounter for examination of blood pressure without abnormal findings: Secondary | ICD-10-CM | POA: Diagnosis not present

## 2017-05-09 DIAGNOSIS — I10 Essential (primary) hypertension: Secondary | ICD-10-CM | POA: Diagnosis not present

## 2017-05-09 NOTE — Progress Notes (Signed)
Pt arrived to [facility]. Pt alert and oriented and arrives in good spirits. Last OV  04/25/17 with Dr. Janne Napoleon.  Pt denies chest pain, SOB, HA, dizziness, or blurred vision presently. She states she does have intermittent dizziness when at work. Dizziness last not longer than 5-10 minutes while at work.  No falls or syncope episodes, chest pain or SOB associated with dizziness.   Verified medication. Pt states medication was taken this morning.   Manual blood pressure reading: 130/80 and 128/80  Will route nurse visit to provider.

## 2017-05-12 ENCOUNTER — Ambulatory Visit (INDEPENDENT_AMBULATORY_CARE_PROVIDER_SITE_OTHER): Payer: Medicare Other | Admitting: *Deleted

## 2017-05-12 DIAGNOSIS — I639 Cerebral infarction, unspecified: Secondary | ICD-10-CM

## 2017-05-12 NOTE — Progress Notes (Signed)
Carelink Summary Report / Loop Recorder 

## 2017-05-16 LAB — CUP PACEART REMOTE DEVICE CHECK
Date Time Interrogation Session: 20180525060627
Implantable Pulse Generator Implant Date: 20150508

## 2017-05-24 ENCOUNTER — Ambulatory Visit (INDEPENDENT_AMBULATORY_CARE_PROVIDER_SITE_OTHER): Payer: Medicare Other | Admitting: *Deleted

## 2017-05-24 DIAGNOSIS — I639 Cerebral infarction, unspecified: Secondary | ICD-10-CM

## 2017-05-24 DIAGNOSIS — Z5181 Encounter for therapeutic drug level monitoring: Secondary | ICD-10-CM | POA: Diagnosis not present

## 2017-05-24 LAB — POCT INR: INR: 2.8

## 2017-05-30 ENCOUNTER — Ambulatory Visit: Payer: Medicare Other | Admitting: Family Medicine

## 2017-06-12 ENCOUNTER — Ambulatory Visit (INDEPENDENT_AMBULATORY_CARE_PROVIDER_SITE_OTHER): Payer: Medicare Other | Admitting: *Deleted

## 2017-06-12 DIAGNOSIS — I639 Cerebral infarction, unspecified: Secondary | ICD-10-CM

## 2017-06-12 NOTE — Progress Notes (Signed)
Carelink Summary Report / Loop Recorder 

## 2017-06-14 ENCOUNTER — Ambulatory Visit (INDEPENDENT_AMBULATORY_CARE_PROVIDER_SITE_OTHER): Payer: Medicare Other

## 2017-06-14 ENCOUNTER — Telehealth: Payer: Self-pay | Admitting: *Deleted

## 2017-06-14 DIAGNOSIS — Z5181 Encounter for therapeutic drug level monitoring: Secondary | ICD-10-CM | POA: Diagnosis not present

## 2017-06-14 DIAGNOSIS — I639 Cerebral infarction, unspecified: Secondary | ICD-10-CM | POA: Diagnosis not present

## 2017-06-14 LAB — CUP PACEART REMOTE DEVICE CHECK
Implantable Pulse Generator Implant Date: 20150508
MDC IDC SESS DTM: 20180624074014

## 2017-06-14 LAB — POCT INR: INR: 4.3

## 2017-06-14 NOTE — Telephone Encounter (Signed)
Spoke with patient regarding LINQ at [facility] as of 06/08/17.  Patient declines appointment with Dr. Allred to discuss explant or plan.  She is agreeable to receiving monitor return kit shipped to her home address.  Patient is aware to call in the future if she wishes to schedule f/u appointment.  She is appreciative of call and denies additional questions or concerns at this time. 

## 2017-07-05 ENCOUNTER — Ambulatory Visit (INDEPENDENT_AMBULATORY_CARE_PROVIDER_SITE_OTHER): Payer: Medicare Other | Admitting: *Deleted

## 2017-07-05 DIAGNOSIS — I639 Cerebral infarction, unspecified: Secondary | ICD-10-CM

## 2017-07-05 DIAGNOSIS — Z5181 Encounter for therapeutic drug level monitoring: Secondary | ICD-10-CM

## 2017-07-05 LAB — POCT INR: INR: 3.5

## 2017-07-27 ENCOUNTER — Other Ambulatory Visit: Payer: Self-pay | Admitting: Internal Medicine

## 2017-08-02 ENCOUNTER — Other Ambulatory Visit: Payer: Self-pay | Admitting: Internal Medicine

## 2017-08-02 ENCOUNTER — Ambulatory Visit (INDEPENDENT_AMBULATORY_CARE_PROVIDER_SITE_OTHER): Payer: Medicare Other

## 2017-08-02 DIAGNOSIS — Z5181 Encounter for therapeutic drug level monitoring: Secondary | ICD-10-CM

## 2017-08-02 DIAGNOSIS — I639 Cerebral infarction, unspecified: Secondary | ICD-10-CM | POA: Diagnosis not present

## 2017-08-02 LAB — POCT INR: INR: 2.8

## 2017-08-30 ENCOUNTER — Ambulatory Visit (INDEPENDENT_AMBULATORY_CARE_PROVIDER_SITE_OTHER): Payer: Medicare Other

## 2017-08-30 DIAGNOSIS — I639 Cerebral infarction, unspecified: Secondary | ICD-10-CM

## 2017-08-30 DIAGNOSIS — Z5181 Encounter for therapeutic drug level monitoring: Secondary | ICD-10-CM

## 2017-08-30 DIAGNOSIS — I63412 Cerebral infarction due to embolism of left middle cerebral artery: Secondary | ICD-10-CM

## 2017-08-30 LAB — POCT INR: INR: 2.4

## 2017-09-13 DIAGNOSIS — Z9884 Bariatric surgery status: Secondary | ICD-10-CM | POA: Diagnosis not present

## 2017-09-25 ENCOUNTER — Ambulatory Visit (INDEPENDENT_AMBULATORY_CARE_PROVIDER_SITE_OTHER): Payer: Medicare Other

## 2017-09-25 ENCOUNTER — Other Ambulatory Visit
Admission: RE | Admit: 2017-09-25 | Discharge: 2017-09-25 | Disposition: A | Discharge status: Home / Self Care | Payer: Medicare Other | Source: Ambulatory Visit | Attending: Cardiovascular Disease | Admitting: Cardiovascular Disease

## 2017-09-25 DIAGNOSIS — I639 Cerebral infarction, unspecified: Secondary | ICD-10-CM

## 2017-09-25 DIAGNOSIS — Z5181 Encounter for therapeutic drug level monitoring: Secondary | ICD-10-CM | POA: Diagnosis not present

## 2017-09-25 LAB — PROTIME-INR
INR: 3.28
Prothrombin Time: 33.1 seconds — ABNORMAL HIGH (ref 11.4–15.2)

## 2017-09-25 LAB — POCT INR: INR: 5.8

## 2017-10-05 DIAGNOSIS — R928 Other abnormal and inconclusive findings on diagnostic imaging of breast: Secondary | ICD-10-CM | POA: Diagnosis not present

## 2017-10-05 DIAGNOSIS — Z1231 Encounter for screening mammogram for malignant neoplasm of breast: Secondary | ICD-10-CM | POA: Diagnosis not present

## 2017-10-09 ENCOUNTER — Ambulatory Visit (INDEPENDENT_AMBULATORY_CARE_PROVIDER_SITE_OTHER): Payer: Medicare Other

## 2017-10-09 DIAGNOSIS — I639 Cerebral infarction, unspecified: Secondary | ICD-10-CM | POA: Diagnosis not present

## 2017-10-09 DIAGNOSIS — Z5181 Encounter for therapeutic drug level monitoring: Secondary | ICD-10-CM | POA: Diagnosis not present

## 2017-10-09 LAB — POCT INR: INR: 3.4

## 2017-10-25 DIAGNOSIS — R928 Other abnormal and inconclusive findings on diagnostic imaging of breast: Secondary | ICD-10-CM | POA: Diagnosis not present

## 2017-11-06 ENCOUNTER — Ambulatory Visit (INDEPENDENT_AMBULATORY_CARE_PROVIDER_SITE_OTHER): Payer: Medicare Other

## 2017-11-06 DIAGNOSIS — Z5181 Encounter for therapeutic drug level monitoring: Secondary | ICD-10-CM | POA: Diagnosis not present

## 2017-11-06 DIAGNOSIS — I639 Cerebral infarction, unspecified: Secondary | ICD-10-CM

## 2017-11-06 LAB — POCT INR: INR: 4.1

## 2017-11-06 NOTE — Patient Instructions (Signed)
Please take 1/2 pill today, then resume dosage of 1 pill every day except 1 & 1/2 on Mondays and Fridays.  Recheck in 3 weeks.

## 2017-11-15 ENCOUNTER — Other Ambulatory Visit: Payer: Self-pay | Admitting: Internal Medicine

## 2017-11-15 NOTE — Telephone Encounter (Signed)
Please review for refill. Thanks!  

## 2017-11-18 NOTE — Progress Notes (Signed)
Cardiology Office Note  Date:  11/20/2017   ID:  Mary [name], DOB [birth year], MRN [id number]  PCP:  Maren Reamer, MD (Inactive)   Chief Complaint  Patient presents with  . other    12 month follow up. Meds reviewed by the pt. verbally. "doing well."     HPI:  Mary [name] is a 51 y.o. female   bariatric surgery. History of stroke Antiphospholipid syndrome,  on warfarin OSA , not on CPAP Previous loop monitor placed,  previous stroke, she has some difficulty with walking.  Who presents for routine follow-up of her stroke  Implantable loop recorder placed, performed for cryptogenic stroke Declined explant in the past, very nervous Previous records reviewed Ten episodes of 3+ sec pauses noted Unable to afford her CPAP  No new complaints on today's visit  Patient denies PND, orthopnea, edema. No loss of consciousness, no palpitations, no abdominal pain.  No TIA symptoms  Lab work reviewed with her Last cholesterol 2 years ago 120s.  Unclear if she was on a statin at that time HBA1C 5.7  Previous testing reviewed with her Negative stress test 10/20/2016  EKG personally reviewed by myself on todays visit Shows normal sinus rhythm rate 57 bpm no significant ST or T wave changes   PMH:   has a past medical history of Blood transfusion without reported diagnosis, Clotting disorder ([place]), Diabetes mellitus without complication ([place]), Hypertension, Other and unspecified ovarian cysts, and Stroke ([place]).  PSH:    Past Surgical History:  Procedure Laterality Date  . COLONOSCOPY WITH PROPOFOL N/A 11/10/2015   Procedure: COLONOSCOPY WITH PROPOFOL;  Surgeon: Lucilla Lame, MD;  Location: [facility] ENDOSCOPY;  Service: Endoscopy;  Laterality: N/A;  . ESOPHAGOGASTRODUODENOSCOPY (EGD) WITH PROPOFOL N/A 11/10/2015   Procedure: ESOPHAGOGASTRODUODENOSCOPY (EGD) WITH PROPOFOL;  Surgeon: Lucilla Lame, MD;  Location: [facility] ENDOSCOPY;  Service: Endoscopy;  Laterality: N/A;  . LOOP  RECORDER IMPLANT N/A 04/25/2014   Procedure: LOOP RECORDER IMPLANT;  Surgeon: Coralyn Mark, MD;  Location: [facility] CATH LAB;  Service: Cardiovascular;  Laterality: N/A;  . OVARIAN CYST REMOVAL    . TEE WITHOUT CARDIOVERSION N/A 01/25/2013   Procedure: TRANSESOPHAGEAL ECHOCARDIOGRAM (TEE);  Surgeon: Birdie Riddle, MD;  Location: [facility] ENDOSCOPY;  Service: Cardiovascular;  Laterality: N/A;    Current Outpatient Medications  Medication Sig Dispense Refill  . amLODipine (NORVASC) 10 MG tablet Take 1 tablet (10 mg total) by mouth daily. 90 tablet 3  . DOCUSATE SODIUM PO Take by mouth daily.    . ferrous gluconate (FERGON) 324 MG tablet Take 1 tablet (324 mg total) by mouth daily with breakfast. 90 tablet 3  . hydrochlorothiazide (HYDRODIURIL) 25 MG tablet Take 1 tablet (25 mg total) by mouth daily. 90 tablet 3  . metoprolol tartrate (LOPRESSOR) 25 MG tablet TAKE ONE TABLET BY MOUTH TWICE DAILY 180 tablet 3  . Vitamin D, Ergocalciferol, (DRISDOL) 50000 units CAPS capsule Take 1 capsule (50,000 Units total) by mouth every 7 (seven) days. 12 capsule 0  . warfarin (COUMADIN) 5 MG tablet TAKE AS DIRECTED BY COUMADIN CLINIC 100 tablet 0   No current facility-administered medications for this visit.      Allergies:   Other   Social History:  The patient  reports that she quit smoking about 11 years ago. Her smoking use included cigarettes. She has a 10.00 pack-year smoking history. she has never used smokeless tobacco. She reports that she does not drink alcohol or use drugs.   Family History:  family history includes Diabetes in her mother, other, and sister; Diabetes Mellitus II in her mother; Stroke in her father, mother, and other; Transient ischemic attack in her father.    Review of Systems: Review of Systems  Constitutional: Negative.   Respiratory: Negative.   Cardiovascular: Negative.   Gastrointestinal: Negative.   Musculoskeletal: Negative.   Neurological: Negative.    Psychiatric/Behavioral: Negative.   All other systems reviewed and are negative.    PHYSICAL EXAM: VS:  BP 140/72 (BP Location: Left Arm, Patient Position: Sitting, Cuff Size: Large)   Pulse (!) 57   Ht 5\' 5"  (1.651 m)   Wt 230 lb 12 oz (104.7 kg)   BMI 38.40 kg/m  , BMI Body mass index is 38.4 kg/m. GEN: Well nourished, well developed, in no acute distress  HEENT: normal  Neck: no JVD, carotid bruits, or masses Cardiac: RRR; no murmurs, rubs, or gallops,no edema  Respiratory:  clear to auscultation bilaterally, normal work of breathing GI: soft, nontender, nondistended, + BS MS: no deformity or atrophy  Skin: warm and dry, no rash Neuro:  Strength and sensation are intact Psych: euthymic mood, full affect    Recent Labs: 01/30/2017: BUN 12; Creat 0.75; Hemoglobin 11.1; Platelets 312; Potassium 3.7; Sodium 142    Lipid Panel Lab Results  Component Value Date   CHOL 128 07/22/2015   HDL 36 (L) 07/22/2015   LDLCALC 65 07/22/2015   TRIG 133 07/22/2015      Wt Readings from Last 3 Encounters:  11/20/17 230 lb 12 oz (104.7 kg)  04/25/17 249 lb 9.6 oz (113.2 kg)  01/30/17 293 lb (132.9 kg)       ASSESSMENT AND PLAN:  Cryptogenic stroke (HCC) On warfarin for antiphospholipid syndrome No further TIA or stroke symptoms  Essential hypertension - Plan: EKG 12-Lead Blood pressure is well controlled on today's visit. No changes made to the medications.  Antiphospholipid syndrome (HCC) On warfarin as above  Sleep apnea, unspecified type Unable to afford her CPAP Having pauses as recorded on loop monitor Asymptomatic  Loop monitor explant Long discussion with her concerning need to explant the loop She is anxious, walked her through the procedure She is willing to have this done in [place] We will discuss with Dr. Caryl Comes  Disposition:   F/U as needed   Total encounter time more than 25 minutes  Greater than 50% was spent in counseling and coordination of  care with the patient   Orders Placed This Encounter  Procedures  . EKG 12-Lead     Signed, Esmond Plants, M.D., Ph.D. 11/20/2017  [place], [place]

## 2017-11-20 ENCOUNTER — Ambulatory Visit (INDEPENDENT_AMBULATORY_CARE_PROVIDER_SITE_OTHER): Payer: Medicare Other | Admitting: Cardiovascular Disease

## 2017-11-20 ENCOUNTER — Encounter: Payer: Self-pay | Admitting: Cardiovascular Disease

## 2017-11-20 VITALS — BP 140/72 | HR 57 | Ht 65.0 in | Wt 230.8 lb

## 2017-11-20 DIAGNOSIS — G4730 Sleep apnea, unspecified: Secondary | ICD-10-CM | POA: Diagnosis not present

## 2017-11-20 DIAGNOSIS — I63412 Cerebral infarction due to embolism of left middle cerebral artery: Secondary | ICD-10-CM | POA: Diagnosis not present

## 2017-11-20 DIAGNOSIS — I639 Cerebral infarction, unspecified: Secondary | ICD-10-CM

## 2017-11-20 DIAGNOSIS — I10 Essential (primary) hypertension: Secondary | ICD-10-CM

## 2017-11-20 DIAGNOSIS — D6861 Antiphospholipid syndrome: Secondary | ICD-10-CM | POA: Diagnosis not present

## 2017-11-20 NOTE — Patient Instructions (Addendum)
I will talk with Dr. Caryl Comes about removing the reveal device/loop monitor  Medication Instructions:   No medication changes made  Labwork:  No new labs needed  Testing/Procedures:  No further testing at this time   Follow-Up: It was a pleasure seeing you in the office today. Please call us if you have new issues that need to be addressed before your next appt.  [phone]  Your physician wants you to follow-up in:  As needed  If you need a refill on your cardiac medications before your next appointment, please call your pharmacy.

## 2017-11-29 ENCOUNTER — Telehealth: Payer: Self-pay | Admitting: Cardiovascular Disease

## 2017-11-29 NOTE — Telephone Encounter (Signed)
Attempted to call patient. No answer no voicemail.  Second attempt.  Needed to reschedule due to weather.

## 2017-12-04 ENCOUNTER — Ambulatory Visit (INDEPENDENT_AMBULATORY_CARE_PROVIDER_SITE_OTHER): Payer: Medicare Other

## 2017-12-04 DIAGNOSIS — Z5181 Encounter for therapeutic drug level monitoring: Secondary | ICD-10-CM | POA: Diagnosis not present

## 2017-12-04 DIAGNOSIS — I639 Cerebral infarction, unspecified: Secondary | ICD-10-CM

## 2017-12-04 LAB — POCT INR: INR: 3.9

## 2017-12-04 NOTE — Patient Instructions (Signed)
Please take 1/2 pill today, then resume dosage of 1 pill every day except 1 & 1/2 on Mondays and Fridays.  Recheck in 3 weeks.

## 2017-12-25 ENCOUNTER — Telehealth: Payer: Self-pay | Admitting: Cardiovascular Disease

## 2017-12-25 ENCOUNTER — Ambulatory Visit (INDEPENDENT_AMBULATORY_CARE_PROVIDER_SITE_OTHER): Payer: Medicare Other

## 2017-12-25 DIAGNOSIS — I639 Cerebral infarction, unspecified: Secondary | ICD-10-CM

## 2017-12-25 DIAGNOSIS — Z5181 Encounter for therapeutic drug level monitoring: Secondary | ICD-10-CM | POA: Diagnosis not present

## 2017-12-25 LAB — POCT INR: INR: 3.9

## 2017-12-25 NOTE — Telephone Encounter (Signed)
Pt came into office for coumadin check But wanted to get an update on getting her device removed. She states its been 3 years and it has not been working  Would like a call back

## 2017-12-25 NOTE — Telephone Encounter (Signed)
S/w patient. She would like to have her LINQ device removed. Patient would like procedure done here at [place] verses going to [place]. Orginially, Dr Allred placed the device. Advised patient I will route to Dr Olin Pia nurse Nira Conn for advice on arranging this.

## 2017-12-25 NOTE — Patient Instructions (Signed)
Please start new dosage of 1 pill every day. Recheck in 3 weeks.

## 2017-12-26 NOTE — Telephone Encounter (Signed)
If she wants to have this removed, she will need to see Dr. Caryl Comes in the office prior to [facility] being scheduled.   Will forward to support pool to call and arrange an appointment with Dr. Caryl Comes- he has not seen her before- implant was done by Dr. Rayann Heman on 04/25/14 for cryptogenic stroke.

## 2017-12-28 NOTE — Telephone Encounter (Signed)
Not sure who's looking at the support pool. Can one of you call her to schedule follow with Dr. Caryl Comes for [facility] explant.  See note below.  Thanks!

## 2017-12-29 NOTE — Telephone Encounter (Signed)
Attempted to call patient. No answer no vm Will try again at a later time

## 2018-01-03 DIAGNOSIS — Q6689 Other  specified congenital deformities of feet: Secondary | ICD-10-CM | POA: Diagnosis not present

## 2018-01-03 DIAGNOSIS — Z6841 Body Mass Index (BMI) 40.0 and over, adult: Secondary | ICD-10-CM | POA: Diagnosis not present

## 2018-01-03 DIAGNOSIS — M7752 Other enthesopathy of left foot: Secondary | ICD-10-CM | POA: Diagnosis not present

## 2018-01-08 NOTE — Telephone Encounter (Signed)
Attempted to schedule appt with patient   No ans no vm

## 2018-01-15 ENCOUNTER — Ambulatory Visit (INDEPENDENT_AMBULATORY_CARE_PROVIDER_SITE_OTHER): Payer: Medicare Other

## 2018-01-15 DIAGNOSIS — I639 Cerebral infarction, unspecified: Secondary | ICD-10-CM | POA: Diagnosis not present

## 2018-01-15 DIAGNOSIS — Z5181 Encounter for therapeutic drug level monitoring: Secondary | ICD-10-CM

## 2018-01-15 LAB — POCT INR: INR: 3.6

## 2018-01-15 NOTE — Patient Instructions (Signed)
Please start new dosage of 1 pill every day. Please have a large serving of greens today. Recheck in 3 weeks.

## 2018-01-23 ENCOUNTER — Ambulatory Visit (INDEPENDENT_AMBULATORY_CARE_PROVIDER_SITE_OTHER): Payer: Medicare Other | Admitting: Internal Medicine

## 2018-01-23 ENCOUNTER — Encounter: Payer: Self-pay | Admitting: Internal Medicine

## 2018-01-23 VITALS — BP 138/80 | HR 57 | Ht 65.0 in | Wt 227.5 lb

## 2018-01-23 DIAGNOSIS — I639 Cerebral infarction, unspecified: Secondary | ICD-10-CM

## 2018-01-23 NOTE — Progress Notes (Signed)
Patient Care Team: Maren Reamer, MD (Inactive) as PCP - General (Internal Medicine)   HPI  Mary [name] is a 52 y.o. female Seen for an implantable loop recorder implanted 5/15 by Dr. Greggory Brandy for cryptogenic stroke context of hypertension.  She has a history of antiphospholipid syndrome and is on warfarin.  No bleeding  History of bariatric surgery with interval 70 pound weight loss  History of sleep apnea not on CPAP  Shortness of breath is better with her weight loss.  She has no edema.  She is no longer taking hypoglycemics   Records and Results Reviewed   Past Medical History:  Diagnosis Date  . Blood transfusion without reported diagnosis    transfusion December 2016  . Clotting disorder (HCC)    left leg  . Diabetes mellitus without complication ([place])   . Hypertension   . Other and unspecified ovarian cysts   . Stroke [facility])     Past Surgical History:  Procedure Laterality Date  . COLONOSCOPY WITH PROPOFOL N/A 11/10/2015   Procedure: COLONOSCOPY WITH PROPOFOL;  Surgeon: Lucilla Lame, MD;  Location: [facility] ENDOSCOPY;  Service: Endoscopy;  Laterality: N/A;  . ESOPHAGOGASTRODUODENOSCOPY (EGD) WITH PROPOFOL N/A 11/10/2015   Procedure: ESOPHAGOGASTRODUODENOSCOPY (EGD) WITH PROPOFOL;  Surgeon: Lucilla Lame, MD;  Location: [facility] ENDOSCOPY;  Service: Endoscopy;  Laterality: N/A;  . LOOP RECORDER IMPLANT N/A 04/25/2014   Procedure: LOOP RECORDER IMPLANT;  Surgeon: Coralyn Mark, MD;  Location: [place] CATH LAB;  Service: Cardiovascular;  Laterality: N/A;  . OVARIAN CYST REMOVAL    . TEE WITHOUT CARDIOVERSION N/A 01/25/2013   Procedure: TRANSESOPHAGEAL ECHOCARDIOGRAM (TEE);  Surgeon: Birdie Riddle, MD;  Location: [facility] ENDOSCOPY;  Service: Cardiovascular;  Laterality: N/A;    Current Outpatient Medications  Medication Sig Dispense Refill  . amLODipine (NORVASC) 10 MG tablet Take 1 tablet (10 mg total) by mouth daily. 90 tablet 3  . DOCUSATE SODIUM PO Take by mouth daily.      . ferrous gluconate (FERGON) 324 MG tablet Take 1 tablet (324 mg total) by mouth daily with breakfast. 90 tablet 3  . metoprolol tartrate (LOPRESSOR) 25 MG tablet TAKE ONE TABLET BY MOUTH TWICE DAILY 180 tablet 3  . Vitamin D, Ergocalciferol, (DRISDOL) 50000 units CAPS capsule Take 1 capsule (50,000 Units total) by mouth every 7 (seven) days. 12 capsule 0  . warfarin (COUMADIN) 5 MG tablet TAKE AS DIRECTED BY COUMADIN CLINIC 100 tablet 0   No current facility-administered medications for this visit.     Allergies  Allergen Reactions  . Other       Review of Systems negative except from HPI and PMH  Physical Exam BP 138/80 (BP Location: Right Arm, Patient Position: Sitting, Cuff Size: Normal)   Pulse (!) 57   Ht 5\' 5"  (1.651 m)   Wt 227 lb 8 oz (103.2 kg)   BMI 37.86 kg/m  Well developed and obese in no acute distress HENT normal Neck supple with JVP-flat Loop recorder easily palpable Carotids brisk and full without bruits Clear Regular rate and rhythm, no murmurs or gallops Abd-soft with active BS without hepatomegaly No Clubbing cyanosis edema Skin-warm and dry A & Oriented  Grossly normal sensory and motor function  ECG demonstrates sinus rhythm at 57 Intervals 19/08/43   Assessment and  Plan  Cryptogenic stroke  Implantable loop recorder  Hypertension   The patient would like to have her device explanted.  We have reviewed the procedure.  She is  very anxious.  She has done very well following her bariatric surgery with ongoing weight loss.  Current medicines are reviewed at length with the patient today .  The patient does not  have concerns regarding medicines.

## 2018-01-23 NOTE — Patient Instructions (Addendum)
Medication Instructions:  Your physician recommends that you continue on your current medications as directed. Please refer to the Current Medication list given to you today.  Labwork: None ordered.  Testing/Procedures: Your physician has recommended that you have your LINQ recorder explanted  Report to the [facility] Entrance on __________________ at______________ Dennis Bast may have a light breakfast the morning of your procedure You may take all of your regular medications the morning of your procedure Please wash your chest and neck area with an antibacterial soap the night before and morning of your procedure  Follow-Up:  Your physician recommends that you schedule a follow-up appointment in 10-14 days for a wound check with our device clinic in [place].  You will follow up with Dr Caryl Comes as necessary.   Any Other Special Instructions Will Be Listed Below (If Applicable).     If you need a refill on your cardiac medications before your next appointment, please call your pharmacy. `

## 2018-02-01 ENCOUNTER — Ambulatory Visit
Admission: RE | Admit: 2018-02-01 | Discharge: 2018-02-01 | Disposition: A | Discharge status: Home / Self Care | Payer: Medicare Other | Source: Ambulatory Visit | Attending: Internal Medicine | Admitting: Internal Medicine

## 2018-02-01 ENCOUNTER — Encounter
Admission: RE | Disposition: A | Discharge status: Home / Self Care | Payer: Self-pay | Source: Ambulatory Visit | Attending: Internal Medicine

## 2018-02-01 ENCOUNTER — Encounter: Payer: Self-pay | Admitting: *Deleted

## 2018-02-01 DIAGNOSIS — I639 Cerebral infarction, unspecified: Secondary | ICD-10-CM

## 2018-02-01 DIAGNOSIS — Z9889 Other specified postprocedural states: Secondary | ICD-10-CM | POA: Insufficient documentation

## 2018-02-01 DIAGNOSIS — Z862 Personal history of diseases of the blood and blood-forming organs and certain disorders involving the immune mechanism: Secondary | ICD-10-CM | POA: Insufficient documentation

## 2018-02-01 DIAGNOSIS — E119 Type 2 diabetes mellitus without complications: Secondary | ICD-10-CM | POA: Diagnosis not present

## 2018-02-01 DIAGNOSIS — Z79899 Other long term (current) drug therapy: Secondary | ICD-10-CM | POA: Insufficient documentation

## 2018-02-01 DIAGNOSIS — Z8673 Personal history of transient ischemic attack (TIA), and cerebral infarction without residual deficits: Secondary | ICD-10-CM | POA: Diagnosis not present

## 2018-02-01 DIAGNOSIS — I10 Essential (primary) hypertension: Secondary | ICD-10-CM | POA: Diagnosis not present

## 2018-02-01 DIAGNOSIS — Z9884 Bariatric surgery status: Secondary | ICD-10-CM | POA: Diagnosis not present

## 2018-02-01 DIAGNOSIS — Z4509 Encounter for adjustment and management of other cardiac device: Secondary | ICD-10-CM | POA: Diagnosis not present

## 2018-02-01 DIAGNOSIS — Z7901 Long term (current) use of anticoagulants: Secondary | ICD-10-CM | POA: Diagnosis not present

## 2018-02-01 HISTORY — PX: LOOP RECORDER REMOVAL: EP1215

## 2018-02-01 SURGERY — LOOP RECORDER REMOVAL
Anesthesia: Moderate Sedation

## 2018-02-01 SURGICAL SUPPLY — 1 items: PACK LOOP INSERTION (CUSTOM PROCEDURE TRAY) ×1 IMPLANT

## 2018-02-05 ENCOUNTER — Ambulatory Visit (INDEPENDENT_AMBULATORY_CARE_PROVIDER_SITE_OTHER): Payer: Medicare Other

## 2018-02-05 DIAGNOSIS — I639 Cerebral infarction, unspecified: Secondary | ICD-10-CM | POA: Diagnosis not present

## 2018-02-05 DIAGNOSIS — Z5181 Encounter for therapeutic drug level monitoring: Secondary | ICD-10-CM

## 2018-02-05 LAB — POCT INR: INR: 4.2

## 2018-02-05 NOTE — Patient Instructions (Signed)
Please skip coumadin today, then resume dosage of 1 pill every day. Please be consistent with your greens intake. Recheck in 2 weeks

## 2018-02-13 ENCOUNTER — Ambulatory Visit (INDEPENDENT_AMBULATORY_CARE_PROVIDER_SITE_OTHER): Payer: Self-pay | Admitting: *Deleted

## 2018-02-13 DIAGNOSIS — I639 Cerebral infarction, unspecified: Secondary | ICD-10-CM

## 2018-02-13 NOTE — Progress Notes (Signed)
Wound check in clinic, s/p ILR explant on 02/01/18.  Patient removed Steri-strips prior to this appointment.  Wound without redness or edema.  Incision edges approximated, wound well healed.  ROV with SK/B PRN.  Return kit ordered 05/2017 but patient reports she has not received it.  Will f/u with [facility] to ensure she receives return kit at her home address.  Patient is agreeable to plan and denies additional questions or concerns at this time.

## 2018-02-21 ENCOUNTER — Ambulatory Visit (INDEPENDENT_AMBULATORY_CARE_PROVIDER_SITE_OTHER): Payer: Medicare Other

## 2018-02-21 DIAGNOSIS — I639 Cerebral infarction, unspecified: Secondary | ICD-10-CM

## 2018-02-21 DIAGNOSIS — Z5181 Encounter for therapeutic drug level monitoring: Secondary | ICD-10-CM

## 2018-02-21 LAB — POCT INR: INR: 1.8

## 2018-02-21 NOTE — Patient Instructions (Signed)
Please take 2 pills tonight, then resume dosage of 1 pill every day. Recheck in 2 weeks.

## 2018-02-26 DIAGNOSIS — Z9884 Bariatric surgery status: Secondary | ICD-10-CM | POA: Diagnosis not present

## 2018-02-27 ENCOUNTER — Encounter: Payer: Medicare Other | Admitting: Internal Medicine

## 2018-02-28 ENCOUNTER — Other Ambulatory Visit: Payer: Self-pay | Admitting: Internal Medicine

## 2018-03-07 ENCOUNTER — Ambulatory Visit (INDEPENDENT_AMBULATORY_CARE_PROVIDER_SITE_OTHER): Payer: Medicare Other

## 2018-03-07 DIAGNOSIS — Z5181 Encounter for therapeutic drug level monitoring: Secondary | ICD-10-CM

## 2018-03-07 LAB — POCT INR: INR: 2.4

## 2018-03-07 NOTE — Patient Instructions (Signed)
Please take 2 pills tonight, then resume dosage of 1 pill every day. Recheck in 2 weeks.

## 2018-03-21 ENCOUNTER — Ambulatory Visit (INDEPENDENT_AMBULATORY_CARE_PROVIDER_SITE_OTHER): Payer: Medicare Other

## 2018-03-21 DIAGNOSIS — I639 Cerebral infarction, unspecified: Secondary | ICD-10-CM | POA: Diagnosis not present

## 2018-03-21 DIAGNOSIS — Z5181 Encounter for therapeutic drug level monitoring: Secondary | ICD-10-CM | POA: Diagnosis not present

## 2018-03-21 LAB — POCT INR: INR: 3

## 2018-03-21 NOTE — Patient Instructions (Signed)
Please continue current dosage of 1 pill every day. Recheck in 3 weeks.

## 2018-04-09 ENCOUNTER — Ambulatory Visit: Payer: Medicare Other | Admitting: Nurse Practitioner

## 2018-04-11 ENCOUNTER — Ambulatory Visit (INDEPENDENT_AMBULATORY_CARE_PROVIDER_SITE_OTHER): Payer: Medicare Other

## 2018-04-11 DIAGNOSIS — Z5181 Encounter for therapeutic drug level monitoring: Secondary | ICD-10-CM | POA: Diagnosis not present

## 2018-04-11 DIAGNOSIS — I639 Cerebral infarction, unspecified: Secondary | ICD-10-CM | POA: Diagnosis not present

## 2018-04-11 LAB — POCT INR: INR: 2.2

## 2018-04-11 NOTE — Patient Instructions (Signed)
Please take 1.5 tablets tonight, then continue current dosage of 1 pill every day. Recheck in 3 weeks.

## 2018-04-26 ENCOUNTER — Ambulatory Visit: Payer: Medicare Other | Attending: Family Medicine | Admitting: Physician Assistant

## 2018-04-26 VITALS — BP 119/74 | HR 69 | Temp 98.2°F | Resp 16 | Ht 65.0 in | Wt 218.0 lb

## 2018-04-26 DIAGNOSIS — D500 Iron deficiency anemia secondary to blood loss (chronic): Secondary | ICD-10-CM | POA: Diagnosis not present

## 2018-04-26 DIAGNOSIS — I10 Essential (primary) hypertension: Secondary | ICD-10-CM | POA: Diagnosis not present

## 2018-04-26 DIAGNOSIS — Z7901 Long term (current) use of anticoagulants: Secondary | ICD-10-CM | POA: Insufficient documentation

## 2018-04-26 DIAGNOSIS — E559 Vitamin D deficiency, unspecified: Secondary | ICD-10-CM | POA: Insufficient documentation

## 2018-04-26 DIAGNOSIS — Z760 Encounter for issue of repeat prescription: Secondary | ICD-10-CM | POA: Diagnosis not present

## 2018-04-26 DIAGNOSIS — E119 Type 2 diabetes mellitus without complications: Secondary | ICD-10-CM | POA: Diagnosis not present

## 2018-04-26 DIAGNOSIS — Z8673 Personal history of transient ischemic attack (TIA), and cerebral infarction without residual deficits: Secondary | ICD-10-CM | POA: Insufficient documentation

## 2018-04-26 DIAGNOSIS — R0989 Other specified symptoms and signs involving the circulatory and respiratory systems: Secondary | ICD-10-CM | POA: Diagnosis not present

## 2018-04-26 DIAGNOSIS — Z79899 Other long term (current) drug therapy: Secondary | ICD-10-CM | POA: Insufficient documentation

## 2018-04-26 DIAGNOSIS — I639 Cerebral infarction, unspecified: Secondary | ICD-10-CM

## 2018-04-26 MED ORDER — METOPROLOL TARTRATE 25 MG PO TABS: ORAL_TABLET | ORAL | 3 refills | Status: DC

## 2018-04-26 MED ORDER — BENZONATATE 100 MG PO CAPS: 200.0000 mg | ORAL_CAPSULE | Freq: Three times a day (TID) | ORAL | 0 refills | Status: DC | PRN

## 2018-04-26 MED ORDER — HYDROCHLOROTHIAZIDE 25 MG PO TABS: 25.0000 mg | ORAL_TABLET | Freq: Every day | ORAL | 5 refills | Status: DC

## 2018-04-26 MED ORDER — FLUTICASONE PROPIONATE 50 MCG/ACT NA SUSP: 2.0000 | Freq: Every day | NASAL | 6 refills | Status: DC

## 2018-04-26 MED ORDER — AMLODIPINE BESYLATE 10 MG PO TABS: 10.0000 mg | ORAL_TABLET | Freq: Every day | ORAL | 3 refills | Status: DC

## 2018-04-26 MED ORDER — BENZONATATE 200 MG PO CAPS: 200.0000 mg | ORAL_CAPSULE | Freq: Three times a day (TID) | ORAL | 0 refills | Status: DC | PRN

## 2018-04-26 MED ORDER — FLUTICASONE PROPIONATE 50 MCG/ACT NA SUSP: 2.0000 | Freq: Every day | NASAL | 6 refills | Status: AC

## 2018-04-26 MED FILL — AMLODIPINE BESYLATE 10 MG T: 10 | 30 days supply | Qty: 30 | Fill #0

## 2018-04-26 MED FILL — FLUTICASONE PROP 50 MCG SPR: 50 | 30 days supply | Qty: 16 | Fill #0

## 2018-04-26 MED FILL — HYDROCHLOROTHIAZIDE 25 MG T: 25 | 30 days supply | Qty: 30 | Fill #0

## 2018-04-26 MED FILL — METOPROLOL TARTRATE 25 MG T: 25 | 30 days supply | Qty: 60 | Fill #0

## 2018-04-26 MED FILL — BENZONATATE 100 MG CAP: 100 | 6 days supply | Qty: 40 | Fill #0

## 2018-04-26 NOTE — Progress Notes (Signed)
Mary [name], is a 52 y.o. female  OXB:[id number]  AST:[id number]  DOB - 22-Nov-1966  Subjective:  Chief Complaint and HPI: Mary [name] is a 52 y.o. female here today for medication RF and hasn't had an office visit in a while.  Stroke X5 years ago.  Doing well.  Keeps up with INR appts.    Has nasal and chest congestion x several days.  No discolored mucus.   No f/c.    ROS:   Constitutional:  No f/c, No night sweats, No unexplained weight loss. EENT:  No vision changes, No blurry vision, No hearing changes. No additional mouth, throat, or ear problems.  Respiratory: No cough, No SOB Cardiac: No CP, no palpitations GI:  No abd pain, No N/V/D. GU: No Urinary s/sx Musculoskeletal: No joint pain Neuro: No headache, no dizziness, no motor weakness.  Skin: No rash Endocrine:  No polydipsia. No polyuria.  Psych: Denies SI/HI  No problems updated.  ALLERGIES: No Known Allergies  PAST MEDICAL HISTORY: Past Medical History:  Diagnosis Date  . Blood transfusion without reported diagnosis    transfusion December 2016  . Clotting disorder (HCC)    left leg  . Diabetes mellitus without complication ([place])   . Hypertension   . Other and unspecified ovarian cysts   . Stroke [facility])     MEDICATIONS AT HOME: Prior to Admission medications   Medication Sig Start Date End Date Taking? Authorizing Provider  hydrochlorothiazide (HYDRODIURIL) 25 MG tablet Take 1 tablet (25 mg total) by mouth daily. 04/26/18  Yes Argentina Donovan, PA-C  metoprolol tartrate (LOPRESSOR) 25 MG tablet TAKE 25 MG BY MOUTH TWICE DAILY 04/26/18  Yes Argentina Donovan, PA-C  warfarin (COUMADIN) 5 MG tablet TAKE AS DIRECTED BY  COUMADIN  CLINIC 02/28/18  Yes Allred, Jeneen Rinks, MD  amLODipine (NORVASC) 10 MG tablet Take 1 tablet (10 mg total) by mouth daily. 04/26/18   Argentina Donovan, PA-C  benzonatate (TESSALON) 200 MG capsule Take 1 capsule (200 mg total) by mouth 3 (three) times daily as needed for cough.  04/26/18   Argentina Donovan, PA-C  docusate sodium (COLACE) 100 MG capsule Take 100 mg by mouth daily as needed for mild constipation.    [provider]  ferrous gluconate (FERGON) 324 MG tablet Take 1 tablet (324 mg total) by mouth daily with breakfast. Patient not taking: Reported on 01/24/2018 01/31/17   Maren Reamer, MD  fluticasone (FLONASE) 50 MCG/ACT nasal spray Place 2 sprays into both nostrils daily. 04/26/18   Argentina Donovan, PA-C  Vitamin D, Ergocalciferol, (DRISDOL) 50000 units CAPS capsule Take 1 capsule (50,000 Units total) by mouth every 7 (seven) days. Patient not taking: Reported on 01/24/2018 01/31/17   Maren Reamer, MD     Objective:  EXAM:   Vitals:   04/26/18 1411  BP: 119/74  Pulse: 69  Resp: 16  Temp: 98.2 F (36.8 C)  TempSrc: Oral  SpO2: 97%  Weight: 218 lb (98.9 kg)  Height: 5\' 5"  (1.651 m)    General appearance : A&OX3. NAD. Non-toxic-appearing HEENT: Atraumatic and Normocephalic.  PERRLA. EOM intact.  TM clear B. Mouth-MMM, post pharynx WNL w/o erythema, No PND. Nose with turbinates pale and boggy Neck: supple, no JVD. No cervical lymphadenopathy. No thyromegaly Chest/Lungs:  Breathing-non-labored, Good air entry bilaterally, breath sounds normal without rales, rhonchi, or wheezing  CVS: S1 S2 regular, no murmurs, gallops, rubs  Extremities: Bilateral Lower Ext shows no edema, both  legs are warm to touch with = pulse throughout Neurology:  CN II-XII grossly intact, Non focal.   Psych:  TP linear. J/I WNL. Normal speech. Appropriate eye contact and affect.  Skin:  No Rash  Data Review Lab Results  Component Value Date   HGBA1C 5.7 01/30/2017   HGBA1C 6.5 (H) 05/09/2016   HGBA1C 5.5 02/29/2016     Assessment & Plan   1. Chest congestion Uri vs allergic - benzonatate (TESSALON) 200 MG capsule; Take 1 capsule (200 mg total) by mouth 3 (three) times daily as needed for cough.  Dispense: 20 capsule; Refill: 0 - fluticasone  (FLONASE) 50 MCG/ACT nasal spray; Place 2 sprays into both nostrils daily.  Dispense: 16 g; Refill: 6  2. Iron deficiency anemia due to chronic blood loss Not on iron now - CBC with Differential/Platelet  3. Hypertension, unspecified type H/o stroke.  RF meds, continue current regimen - Comprehensive metabolic panel - Lipid panel We have discussed target BP range and blood pressure goal. I have advised patient to check BP regularly and to call us back or report to clinic if the numbers are consistently higher than 140/90. We discussed the importance of compliance with medical therapy and DASH diet recommended, consequences of uncontrolled hypertension discussed.   4. Vitamin D deficiency -check vit D   Patient have been counseled extensively about nutrition and exercise  Return in about 3 months (around 07/27/2018) for assign new PCP; htn, h/o stroke.  The patient was given clear instructions to go to ER or return to medical center if symptoms don't improve, worsen or new problems develop. The patient verbalized understanding. The patient was told to call to get lab results if they haven't heard anything in the next week.     Freeman Caldron, PA-C [facility] and [facility], [place]   04/26/2018, 2:41 PM

## 2018-04-27 ENCOUNTER — Other Ambulatory Visit: Payer: Self-pay | Admitting: Physician Assistant

## 2018-04-27 DIAGNOSIS — E876 Hypokalemia: Secondary | ICD-10-CM

## 2018-04-27 LAB — COMPREHENSIVE METABOLIC PANEL
ALBUMIN: 4 g/dL (ref 3.5–5.5)
ALK PHOS: 65 IU/L (ref 39–117)
ALT: 18 IU/L (ref 0–32)
AST: 17 IU/L (ref 0–40)
Albumin/Globulin Ratio: 1.2 (ref 1.2–2.2)
BUN / CREAT RATIO: 15 (ref 9–23)
BUN: 17 mg/dL (ref 6–24)
Bilirubin Total: <0.2 mg/dL (ref 0.0–1.2)
CALCIUM: 8.8 mg/dL (ref 8.7–10.2)
CO2: 26 mmol/L (ref 20–29)
CREATININE: 1.1 mg/dL — AB (ref 0.57–1.00)
Chloride: 101 mmol/L (ref 96–106)
GFR, EST AFRICAN AMERICAN: 67 mL/min/{1.73_m2} (ref >59)
GFR, EST NON AFRICAN AMERICAN: 58 mL/min/{1.73_m2} — AB (ref >59)
GLOBULIN, TOTAL: 3.4 g/dL (ref 1.5–4.5)
GLUCOSE: 103 mg/dL — AB (ref 65–99)
Potassium: 3 mmol/L — ABNORMAL LOW (ref 3.5–5.2)
SODIUM: 141 mmol/L (ref 134–144)
TOTAL PROTEIN: 7.4 g/dL (ref 6.0–8.5)

## 2018-04-27 LAB — CBC WITH DIFFERENTIAL/PLATELET
BASOS: 1 %
Basophils Absolute: 0.1 10*3/uL (ref 0.0–0.2)
EOS (ABSOLUTE): 0.1 10*3/uL (ref 0.0–0.4)
EOS: 3 %
HEMATOCRIT: 33.6 % — AB (ref 34.0–46.6)
Hemoglobin: 11.1 g/dL (ref 11.1–15.9)
Immature Grans (Abs): 0 10*3/uL (ref 0.0–0.1)
Immature Granulocytes: 0 %
LYMPHS ABS: 1.7 10*3/uL (ref 0.7–3.1)
Lymphs: 40 %
MCH: 24.1 pg — ABNORMAL LOW (ref 26.6–33.0)
MCHC: 33 g/dL (ref 31.5–35.7)
MCV: 73 fL — AB (ref 79–97)
MONOCYTES: 10 %
MONOS ABS: 0.4 10*3/uL (ref 0.1–0.9)
Neutrophils Absolute: 1.9 10*3/uL (ref 1.4–7.0)
Neutrophils: 46 %
Platelets: 294 10*3/uL (ref 150–379)
RBC: 4.6 x10E6/uL (ref 3.77–5.28)
RDW: 15.6 % — AB (ref 12.3–15.4)
WBC: 4.3 10*3/uL (ref 3.4–10.8)

## 2018-04-27 LAB — LIPID PANEL
Chol/HDL Ratio: 3.5 ratio (ref 0.0–4.4)
Cholesterol, Total: 152 mg/dL (ref 100–199)
HDL: 43 mg/dL (ref >39)
LDL CALC: 70 mg/dL (ref 0–99)
Triglycerides: 195 mg/dL — ABNORMAL HIGH (ref 0–149)
VLDL CHOLESTEROL CAL: 39 mg/dL (ref 5–40)

## 2018-04-27 LAB — VITAMIN D 25 HYDROXY (VIT D DEFICIENCY, FRACTURES): VIT D 25 HYDROXY: 41.7 ng/mL (ref 30.0–100.0)

## 2018-04-27 MED ORDER — POTASSIUM CHLORIDE CRYS ER 20 MEQ PO TBCR: 20.0000 meq | EXTENDED_RELEASE_TABLET | Freq: Every day | ORAL | 1 refills | Status: DC

## 2018-04-30 ENCOUNTER — Telehealth: Payer: Self-pay

## 2018-04-30 NOTE — Telephone Encounter (Signed)
CMA called patient to inform on lab results, rx, and make a lab appointment.  Patient understood.

## 2018-04-30 NOTE — Telephone Encounter (Signed)
-----   Message from Argentina Donovan, [place] sent at 04/27/2018 11:16 AM EDT ----- Your vitamin D is at a normal level.  Your potassium is low.  I sent you a prescription for potassium you should start taking as soon as possible.  Your kidney function is mildly impaired.  Please call and schedule a lab appt in 2-3 weeks and we will recheck both. Thanks, Freeman Caldron, PA-C

## 2018-05-07 ENCOUNTER — Ambulatory Visit (INDEPENDENT_AMBULATORY_CARE_PROVIDER_SITE_OTHER): Payer: Medicare Other

## 2018-05-07 DIAGNOSIS — I639 Cerebral infarction, unspecified: Secondary | ICD-10-CM

## 2018-05-07 DIAGNOSIS — Z5181 Encounter for therapeutic drug level monitoring: Secondary | ICD-10-CM | POA: Diagnosis not present

## 2018-05-07 LAB — POCT INR: INR: 4.5 — AB (ref 2.0–3.0)

## 2018-05-07 NOTE — Patient Instructions (Signed)
Please skip coumadin tonight, then continue current dosage of 1 pill every day. Recheck in 2 weeks.

## 2018-05-23 ENCOUNTER — Ambulatory Visit (INDEPENDENT_AMBULATORY_CARE_PROVIDER_SITE_OTHER): Payer: Medicare Other

## 2018-05-23 DIAGNOSIS — I639 Cerebral infarction, unspecified: Secondary | ICD-10-CM | POA: Diagnosis not present

## 2018-05-23 DIAGNOSIS — Z5181 Encounter for therapeutic drug level monitoring: Secondary | ICD-10-CM

## 2018-05-23 LAB — POCT INR: INR: 4 — AB (ref 2.0–3.0)

## 2018-05-23 NOTE — Patient Instructions (Signed)
Please skip coumadin tonight, then continue current dosage of 1 pill every day. Recheck in 2 weeks.

## 2018-06-03 ENCOUNTER — Other Ambulatory Visit: Payer: Self-pay | Admitting: Internal Medicine

## 2018-06-18 ENCOUNTER — Ambulatory Visit (INDEPENDENT_AMBULATORY_CARE_PROVIDER_SITE_OTHER): Payer: Medicare Other

## 2018-06-18 ENCOUNTER — Other Ambulatory Visit: Payer: Self-pay | Admitting: Internal Medicine

## 2018-06-18 DIAGNOSIS — I639 Cerebral infarction, unspecified: Secondary | ICD-10-CM

## 2018-06-18 DIAGNOSIS — Z5181 Encounter for therapeutic drug level monitoring: Secondary | ICD-10-CM

## 2018-06-18 LAB — POCT INR: INR: 3.5 — AB (ref 2.0–3.0)

## 2018-06-18 NOTE — Patient Instructions (Signed)
Please have a large serving of greens today and continue current dosage of 1 pill every day. Recheck in 3 weeks.

## 2018-07-16 ENCOUNTER — Telehealth: Payer: Self-pay

## 2018-07-16 NOTE — Telephone Encounter (Signed)
Pt overdue for INR check. Attempted to contact her to schedule appt.  VM at mobile # was not for her; no answer, no vm at home #.

## 2018-07-25 ENCOUNTER — Ambulatory Visit (INDEPENDENT_AMBULATORY_CARE_PROVIDER_SITE_OTHER): Payer: Medicare Other

## 2018-07-25 DIAGNOSIS — I639 Cerebral infarction, unspecified: Secondary | ICD-10-CM | POA: Diagnosis not present

## 2018-07-25 DIAGNOSIS — Z5181 Encounter for therapeutic drug level monitoring: Secondary | ICD-10-CM

## 2018-07-25 LAB — POCT INR: INR: 3.6 — AB (ref 2.0–3.0)

## 2018-07-25 NOTE — Patient Instructions (Signed)
Please have a large serving of greens today START NEW DOSAGE of 1 pill every day except 1/2 tablets on Mondays & Fridays.  Recheck in 3 weeks

## 2018-07-30 ENCOUNTER — Encounter: Payer: Self-pay | Admitting: Family Medicine

## 2018-07-30 ENCOUNTER — Ambulatory Visit: Payer: Medicare Other | Attending: Family Medicine | Admitting: Family Medicine

## 2018-07-30 VITALS — BP 130/70 | HR 58 | Temp 97.5°F | Ht 65.0 in | Wt 229.6 lb

## 2018-07-30 DIAGNOSIS — Z8673 Personal history of transient ischemic attack (TIA), and cerebral infarction without residual deficits: Secondary | ICD-10-CM | POA: Diagnosis not present

## 2018-07-30 DIAGNOSIS — L84 Corns and callosities: Secondary | ICD-10-CM | POA: Diagnosis not present

## 2018-07-30 DIAGNOSIS — Z9889 Other specified postprocedural states: Secondary | ICD-10-CM | POA: Insufficient documentation

## 2018-07-30 DIAGNOSIS — Z7901 Long term (current) use of anticoagulants: Secondary | ICD-10-CM | POA: Diagnosis not present

## 2018-07-30 DIAGNOSIS — I639 Cerebral infarction, unspecified: Secondary | ICD-10-CM

## 2018-07-30 DIAGNOSIS — E119 Type 2 diabetes mellitus without complications: Secondary | ICD-10-CM | POA: Diagnosis not present

## 2018-07-30 DIAGNOSIS — I10 Essential (primary) hypertension: Secondary | ICD-10-CM | POA: Insufficient documentation

## 2018-07-30 DIAGNOSIS — D6861 Antiphospholipid syndrome: Secondary | ICD-10-CM | POA: Diagnosis not present

## 2018-07-30 DIAGNOSIS — Z79899 Other long term (current) drug therapy: Secondary | ICD-10-CM | POA: Insufficient documentation

## 2018-07-30 MED ORDER — ATORVASTATIN CALCIUM 20 MG PO TABS: 20.0000 mg | ORAL_TABLET | Freq: Every day | ORAL | 3 refills | Status: DC

## 2018-07-30 NOTE — Progress Notes (Signed)
Subjective:  Patient ID: Mary [name], female    DOB: Sep 03, 1966  Age: 52 y.o. MRN: [id number]  CC: Establish Care   HPI Mary [name] is a 52 year old female with history of antiphospholipid syndrome (on chronic anticoagulation with Coumadin, followed by the Coumadin clinic), previous stroke, hypertension presents today to establish care with me. She has been compliant with her Coumadin and denies bruising; her last INR was 3.6, 5 days ago at the Coumadin clinic. She has no residual weakness from her stroke but does complain of intermittent confusion (which is described as sometimes forgetting where she parked) and word finding deficits.  At the time of her stroke 5 years ago she underwent PT/OT/ST. she is requesting renewal of her form for handicap placard. She has a callus on her left foot would like referral to podiatrist for this as it is painful intermittently.  Past Medical History:  Diagnosis Date  . Blood transfusion without reported diagnosis    transfusion December 2016  . Clotting disorder (HCC)    left leg  . Diabetes mellitus without complication ([place])   . Hypertension   . Other and unspecified ovarian cysts   . Stroke [facility])     Past Surgical History:  Procedure Laterality Date  . COLONOSCOPY WITH PROPOFOL N/A 11/10/2015   Procedure: COLONOSCOPY WITH PROPOFOL;  Surgeon: Lucilla Lame, MD;  Location: [facility] ENDOSCOPY;  Service: Endoscopy;  Laterality: N/A;  . ESOPHAGOGASTRODUODENOSCOPY (EGD) WITH PROPOFOL N/A 11/10/2015   Procedure: ESOPHAGOGASTRODUODENOSCOPY (EGD) WITH PROPOFOL;  Surgeon: Lucilla Lame, MD;  Location: [facility] ENDOSCOPY;  Service: Endoscopy;  Laterality: N/A;  . LAPAROSCOPIC GASTRIC SLEEVE RESECTION  02/17/2016   placement  . LOOP RECORDER IMPLANT N/A 04/25/2014   Procedure: LOOP RECORDER IMPLANT;  Surgeon: Coralyn Mark, MD;  Location: [place] CATH LAB;  Service: Cardiovascular;  Laterality: N/A;  . LOOP RECORDER REMOVAL N/A 02/01/2018   Procedure: LOOP  RECORDER REMOVAL;  Surgeon: Deboraha Sprang, MD;  Location: [place] CV LAB;  Service: Cardiovascular;  Laterality: N/A;  . OVARIAN CYST REMOVAL    . TEE WITHOUT CARDIOVERSION N/A 01/25/2013   Procedure: TRANSESOPHAGEAL ECHOCARDIOGRAM (TEE);  Surgeon: Birdie Riddle, MD;  Location: [facility] ENDOSCOPY;  Service: Cardiovascular;  Laterality: N/A;    No Known Allergies  Outpatient Medications Prior to Visit  Medication Sig Dispense Refill  . hydrochlorothiazide (HYDRODIURIL) 25 MG tablet Take 1 tablet (25 mg total) by mouth daily. 30 tablet 5  . metoprolol tartrate (LOPRESSOR) 25 MG tablet TAKE 25 MG BY MOUTH TWICE DAILY 180 tablet 3  . warfarin (COUMADIN) 5 MG tablet TAKE AS DIRECTED BY  COUMADIN  CLINIC 100 tablet 0  . benzonatate (TESSALON) 100 MG capsule Take 2 capsules (200 mg total) by mouth 3 (three) times daily as needed for cough. (Patient not taking: Reported on 07/30/2018) 40 capsule 0  . docusate sodium (COLACE) 100 MG capsule Take 100 mg by mouth daily as needed for mild constipation.    . ferrous gluconate (FERGON) 324 MG tablet Take 1 tablet (324 mg total) by mouth daily with breakfast. (Patient not taking: Reported on 01/24/2018) 90 tablet 3  . fluticasone (FLONASE) 50 MCG/ACT nasal spray Place 2 sprays into both nostrils daily. (Patient not taking: Reported on 07/30/2018) 16 g 6  . potassium chloride SA (K-DUR,KLOR-CON) 20 MEQ tablet Take 1 tablet (20 mEq total) by mouth daily. (Patient not taking: Reported on 07/30/2018) 30 tablet 1  . Vitamin D, Ergocalciferol, (DRISDOL) 50000 units CAPS capsule Take  1 capsule (50,000 Units total) by mouth every 7 (seven) days. (Patient not taking: Reported on 01/24/2018) 12 capsule 0  . warfarin (COUMADIN) 5 MG tablet TAKE AS DIRECTED BY  COUMADIN  CLINIC 100 tablet 0  . amLODipine (NORVASC) 10 MG tablet Take 1 tablet (10 mg total) by mouth daily. (Patient not taking: Reported on 07/30/2018) 90 tablet 3   No facility-administered medications prior to  visit.     ROS Review of Systems  Constitutional: Negative for activity change, appetite change and fatigue.  HENT: Negative for congestion, sinus pressure and sore throat.   Eyes: Negative for visual disturbance.  Respiratory: Negative for cough, chest tightness, shortness of breath and wheezing.   Cardiovascular: Negative for chest pain and palpitations.  Gastrointestinal: Negative for abdominal distention, abdominal pain and constipation.  Endocrine: Negative for polydipsia.  Genitourinary: Negative for dysuria and frequency.  Musculoskeletal: Negative for arthralgias and back pain.  Skin: Negative for rash.  Neurological: Negative for tremors, light-headedness and numbness.  Hematological: Does not bruise/bleed easily.  Psychiatric/Behavioral: Negative for agitation and behavioral problems.    Objective:  BP 130/70   Pulse (!) 58   Temp (!) 97.5 F (36.4 C) (Oral)   Ht 5\' 5"  (1.651 m)   Wt 229 lb 9.6 oz (104.1 kg)   SpO2 98%   BMI 38.21 kg/m   BP/Weight 07/30/2018 04/26/2018 0/86/7619  Systolic BP 509 326 712  Diastolic BP 70 74 77  Wt. (Lbs) 229.6 218 227  BMI 38.21 36.28 37.77      Physical Exam  Constitutional: She is oriented to person, place, and time. She appears well-developed and well-nourished.  Cardiovascular: Normal rate, normal heart sounds and intact distal pulses.  No murmur heard. Pulmonary/Chest: Effort normal and breath sounds normal. She has no wheezes. She has no rales. She exhibits no tenderness.  Abdominal: Soft. Bowel sounds are normal. She exhibits no distension and no mass. There is no tenderness.  Musculoskeletal: Normal range of motion.  Neurological: She is alert and oriented to person, place, and time.  Skin: Skin is warm and dry.  Psychiatric: She has a normal mood and affect.     Assessment & Plan:   1. Essential hypertension Controlled Counseled on blood pressure goal of less than 130/80, low-sodium, DASH diet, medication  compliance, 150 minutes of moderate intensity exercise per week. Discussed medication compliance, adverse effects.  2. History of stroke With residual word finding deficits Referred to speech therapy Risk factor modification Continue Coumadin which is managed by the Coumadin clinic - Ambulatory referral to Speech Therapy  3. Antiphospholipid syndrome (HCC) High risk for thrombotic events  4. Callus - Ambulatory referral to Podiatry   Meds ordered this encounter  Medications  . atorvastatin (LIPITOR) 20 MG tablet    Sig: Take 1 tablet (20 mg total) by mouth daily.    Dispense:  30 tablet    Refill:  3    Follow-up: Return in about 1 month (around 08/30/2018) for annual wellness exam.   [name] MD

## 2018-07-30 NOTE — Patient Instructions (Signed)

## 2018-07-30 NOTE — Progress Notes (Signed)
Patient becomes very overwhelmed after stroke.

## 2018-08-02 ENCOUNTER — Other Ambulatory Visit: Payer: Self-pay

## 2018-08-02 ENCOUNTER — Ambulatory Visit: Payer: Medicare Other | Attending: Family Medicine | Admitting: Speech Pathology

## 2018-08-02 DIAGNOSIS — R4701 Aphasia: Secondary | ICD-10-CM | POA: Diagnosis not present

## 2018-08-02 NOTE — Therapy (Signed)
[facility] [address] [place], [place], [address] Phone: [phone]   Fax:  [phone]  Speech Language Pathology Evaluation  Patient Details  Name: Mary [name] MRN: [id number] Date of Birth: [birth year] Referring Provider: [name], MD   Encounter Date: 08/02/2018  End of Session - 08/02/18 1514    Visit Number  1    Number of Visits  5    Date for SLP Re-Evaluation  09/07/18    SLP Start Time  1311    SLP Stop Time   1400    SLP Time Calculation (min)  49 min    Activity Tolerance  Patient tolerated treatment well       Past Medical History:  Diagnosis Date  . Blood transfusion without reported diagnosis    transfusion December 2016  . Clotting disorder (HCC)    left leg  . Diabetes mellitus without complication ([place])   . Hypertension   . Other and unspecified ovarian cysts   . Stroke [facility])     Past Surgical History:  Procedure Laterality Date  . COLONOSCOPY WITH PROPOFOL N/A 11/10/2015   Procedure: COLONOSCOPY WITH PROPOFOL;  Surgeon: Lucilla Lame, MD;  Location: [facility] ENDOSCOPY;  Service: Endoscopy;  Laterality: N/A;  . ESOPHAGOGASTRODUODENOSCOPY (EGD) WITH PROPOFOL N/A 11/10/2015   Procedure: ESOPHAGOGASTRODUODENOSCOPY (EGD) WITH PROPOFOL;  Surgeon: Lucilla Lame, MD;  Location: [facility] ENDOSCOPY;  Service: Endoscopy;  Laterality: N/A;  . LAPAROSCOPIC GASTRIC SLEEVE RESECTION  02/17/2016   placement  . LOOP RECORDER IMPLANT N/A 04/25/2014   Procedure: LOOP RECORDER IMPLANT;  Surgeon: Coralyn Mark, MD;  Location: [facility] CATH LAB;  Service: Cardiovascular;  Laterality: N/A;  . LOOP RECORDER REMOVAL N/A 02/01/2018   Procedure: LOOP RECORDER REMOVAL;  Surgeon: Deboraha Sprang, MD;  Location: [facility] CV LAB;  Service: Cardiovascular;  Laterality: N/A;  . OVARIAN CYST REMOVAL    . TEE WITHOUT CARDIOVERSION N/A 01/25/2013   Procedure: TRANSESOPHAGEAL ECHOCARDIOGRAM (TEE);  Surgeon: Birdie Riddle,  MD;  Location: [facility];  Service: Cardiovascular;  Laterality: N/A;    There were no vitals filed for this visit.  Subjective Assessment - 08/02/18 1316    Subjective  "When I have to tell somebody where to go I have to just show them."    Currently in Pain?  No/denies         SLP Evaluation [facility] - 08/02/18 1316      SLP Visit Information   SLP Received On  08/02/18    Referring Provider  [name], MD    Onset Date  01/23/2013    Medical Diagnosis  86.73 (ICD-10-CM) - History of stroke      General Information   HPI  Mary [name] is a 52 year old female with history of stroke on 01/23/2013. At the time of her stroke 5 years ago she underwent PT/OT/ST. Referred for ST due for residual word finding deficits.      Balance Screen   Has the patient fallen in the past 6 months  No    Has the patient had a decrease in activity level because of a fear of falling?   No    Is the patient reluctant to leave their home because of a fear of falling?   No      Prior Functional Status   Cognitive/Linguistic Baseline  Baseline deficits    Baseline deficit details  aphasia      Cognition   Overall Cognitive Status  Within Functional Limits for tasks assessed      Auditory Comprehension   Overall Auditory Comprehension  Appears within functional limits for tasks assessed    Yes/No Questions  Within Functional Limits    Commands  Within Functional Limits    Conversation  Moderately complex      Visual Recognition/Discrimination   Discrimination  Within Function Limits      Reading Comprehension   Reading Status  Impaired    Word level  76-100% accurate    Sentence Level  51-75% accurate   75%   Paragraph Level  51-75% accurate      Expression   Primary Mode of Expression  Verbal      Verbal Expression   Overall Verbal Expression  Impaired at baseline    Initiation  No impairment    Automatic Speech  Name;Social Response    Level of Generative/Spontaneous  Verbalization  Conversation    Repetition  No impairment    Naming  Impairment    Confrontation  50-74% accurate   31/60 BNT   Verbal Errors  Semantic paraphasias;Phonemic paraphasias    Pragmatics  No impairment    Effective Techniques  Open ended questions;Semantic cues;Sentence completion;Phonemic cues;Written cues      Written Expression   Dominant Hand  Right    Written Expression  Not tested      Oral Motor/Sensory Function   Overall Oral Motor/Sensory Function  Appears within functional limits for tasks assessed      Motor Speech   Overall Motor Speech  Impaired at baseline    Respiration  Within functional limits    Phonation  Normal    Resonance  Within functional limits    Articulation  Within functional limitis    Intelligibility  Intelligible    Motor Planning  Impaired    Level of Impairment  Conversation    Motor Speech Errors  Groping for words;Inconsistent    Phonation  WFL      Standardized Assessments   Standardized Assessments   [place] Naming Test-2nd edition    Boston Naming Test-2nd edition   31/60, (55.2 is mean for age, SD 48)                      SLP Education - 08/02/18 1521    Education Details  compensations for anomia, text to speech, proposed therapy goals/POC    Person(s) Educated  Patient    Methods  Explanation;Handout    Comprehension  Verbalized understanding;Need further instruction         SLP Long Term Goals - 08/02/18 1511      SLP LONG TERM GOAL #1   Title  Pt will utilize compensations for anomia when answering questions, naming and in mod complex conversation with rare min A over 3 sessions.    Time  4    Period  Weeks    Status  New      SLP LONG TERM GOAL #2   Title  Pt will report improved participation in social/ community interactions with multimodal communication tools.     Time  4    Period  Weeks    Status  New      SLP LONG TERM GOAL #3   Title  Pt will use AAC (text to speech) for comprehension  of texts, emails over 3 sessions.    Time  4    Period  Weeks    Status  New  Plan - 08/02/18 1522    Clinical Impression Statement  Mary [name] presents with mild persisting anomic aphasia resulting from CVA in 2014. Mary [name] is fluent, with intermittent episodes of anomia, dysnomia. Semantic, phonemic and verbal paraphasias apparent in confrontation naming task, for which pt accuracy is ~52% accurate. Mild apraxic errors (groping) noted. Auditory comprehension is adequate for mod complex conversation. Reading comprehension is impaired for mod complex sentences (75% accuracy at sentence level). Pt reports frustration and decreased ability to communicate at work due to her word-finding problems. She reports anomia is more severe with time pressure. She received extensive ST at [facility] for aphasia after her stroke. I recommend a brief course of ST for training in compensations for anomia as well as AAC/accessibility features to improve her reading comprehension.    Speech Therapy Frequency  1x /week    Duration  4 weeks    Treatment/Interventions  Compensatory strategies;Language facilitation;Compensatory techniques;Cueing hierarchy;Internal/external aids;Functional tasks;SLP instruction and feedback;Patient/family education    Potential to Achieve Goals  Good    Potential Considerations  Other (comment)   time post-onset   Consulted and Agree with Plan of Care  Patient       Patient will benefit from skilled therapeutic intervention in order to improve the following deficits and impairments:   Aphasia    Problem List Patient Active Problem List   Diagnosis Date Noted  . Sleep apnea 07/07/2016  . Anemia, iron deficiency 06/27/2016  . Acute posthemorrhagic anemia   . OB + stool   . Benign neoplasm of ascending colon   . Sepsis ([place]) 11/09/2015  . Fecal occult blood test positive 11/09/2015  . Absolute anemia   . Cerebral infarction due to embolism of left middle  cerebral artery ([place]) 07/16/2015  . Antiphospholipid syndrome ([place]) 11/17/2014  . Cryptogenic stroke ([place]) 10/08/2014  . Obesity 10/08/2014  . Unspecified vitamin D deficiency 09/08/2014  . Anemia, unspecified 09/08/2014  . Severe obesity (BMI >= 40) ([place]) 08/21/2013  . Acquired dyslexia 07/12/2013  . Aphasia due to stroke 07/12/2013  . Headache 07/12/2013  . 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 History of stroke 01/24/2013  . HTN (hypertension) 01/24/2013   Deneise Lever, [place], [place] 08/02/2018, 3:28 PM  [facility] [address] [place], [place], [address] Phone: [phone]   Fax:  [phone]  Name: Mary [name] MRN: [id number] Date of Birth: [birth year]

## 2018-08-02 NOTE — Patient Instructions (Signed)
Tips for Talking with People who have Aphasia  . Say one thing at a time . Don't  rush - slow down, be patient . Talk face to face . Reduce background noise . Relax - be natural . Use pen and paper . Write down key words . Draw diagrams or pictures . Don't pretend you understand . Ask what helps . Recap - check you both understand . Be a partner, not a therapist   Aphasia does not affect intelligence, only language. The person with aphasia can still: make decisions, have opinions, and socialize.   Describing words  What group does it belong to?  What do I use it for?  Where can I find it?  What does it LOOK like?  What other words go with it?  What is the 1st sound of the word?          Many Ways to Communicate  Describe it Write it Draw it Gesture it Use related words   Check out TalkPath therapy (app for ipad or you can google it and access from your home computer). Free app, sign up for an account with your email address There's an App for that: Family Feud, Heads up, Stop-fun categories, What if, [employer]

## 2018-08-06 ENCOUNTER — Ambulatory Visit: Payer: Medicare Other | Admitting: Speech Pathology

## 2018-08-13 ENCOUNTER — Ambulatory Visit (INDEPENDENT_AMBULATORY_CARE_PROVIDER_SITE_OTHER): Payer: Medicare Other

## 2018-08-13 DIAGNOSIS — I639 Cerebral infarction, unspecified: Secondary | ICD-10-CM | POA: Diagnosis not present

## 2018-08-13 DIAGNOSIS — Z5181 Encounter for therapeutic drug level monitoring: Secondary | ICD-10-CM

## 2018-08-13 DIAGNOSIS — Z8673 Personal history of transient ischemic attack (TIA), and cerebral infarction without residual deficits: Secondary | ICD-10-CM | POA: Diagnosis not present

## 2018-08-13 LAB — POCT INR: INR: 3 (ref 2.0–3.0)

## 2018-08-13 NOTE — Patient Instructions (Signed)
Please continue dosage of 1 pill every day except 1/2 tablets on Mondays & Fridays.  Recheck in 4 weeks.

## 2018-08-14 ENCOUNTER — Encounter: Payer: Medicare Other | Admitting: Speech Pathology

## 2018-08-16 ENCOUNTER — Encounter: Payer: Self-pay | Admitting: Podiatry

## 2018-08-16 ENCOUNTER — Ambulatory Visit (INDEPENDENT_AMBULATORY_CARE_PROVIDER_SITE_OTHER): Payer: Medicare Other | Admitting: Podiatry

## 2018-08-16 DIAGNOSIS — Q828 Other specified congenital malformations of skin: Secondary | ICD-10-CM | POA: Diagnosis not present

## 2018-08-16 DIAGNOSIS — I639 Cerebral infarction, unspecified: Secondary | ICD-10-CM

## 2018-08-16 DIAGNOSIS — M216X2 Other acquired deformities of left foot: Secondary | ICD-10-CM

## 2018-08-16 DIAGNOSIS — E119 Type 2 diabetes mellitus without complications: Secondary | ICD-10-CM

## 2018-08-16 NOTE — Progress Notes (Signed)
This patient presents to the office with chief complaint of painful callus left foot and diabetic feet.  This patient  says there  is  severe  Pain noted under callus left forefoot.   This patient says the callus is painful walking and wearing her shoes.  This callus is painful walking. .  Patient has no history of infection or drainage from both feet.  Patient is unable to  self treat his own callus.. This patient presents  to the office today for treatment of her callus  and a foot evaluation due to history of  diabetes.  General Appearance  Alert, conversant and in no acute stress.  Vascular  Dorsalis pedis and posterior tibial  pulses are weakly palpable  bilaterally.  Capillary return is within normal limits  bilaterally. Temperature is within normal limits  bilaterally.  Neurologic  Senn-Weinstein monofilament wire test within normal limits  bilaterally. Muscle power within normal limits bilaterally.  Nails Normal nails noted with no evidence of bacterial or fungal infection.  Orthopedic  N0 limitation of motion feet .  No crepitus or effusions noted.  No bony pathology or digital deformities noted. Plantar flex fifth metatarsal left foot.  Skin  normotropic skin with no porokeratosis noted bilaterally.  No signs of infections or ulcers noted.   Porokeratosis sub 5th met left foot.  Porokeratosis sub 5th met left foot.  Diabetes with no foot complications  IE  Debride porokeratosis left foot.   Padding applied to insole..  A diabetic foot exam was performed and there is no evidence of any vascular or neurologic pathology.   RTC 3 months.   Gardiner Barefoot DPM

## 2018-08-22 ENCOUNTER — Telehealth: Payer: Self-pay | Admitting: Internal Medicine

## 2018-08-22 NOTE — Telephone Encounter (Signed)
Patient calling stating she is out of town and is on warfarin and did not take it for two days She would like to know how to restart taking it since she missed it  Please call back

## 2018-08-22 NOTE — Telephone Encounter (Signed)
Spoke w/ pt.  She is currently out of town and ran out of coumadin. She was able to have the rx transferred to a [facility] close to her and pick up today. Advised her to resume coumadin today w/ extra 1/2 tablet today and tomorrow. Will recheck INR when she is back home on 09/10/18. She is appreciative and will call back w/ any further questions or concerns.

## 2018-08-27 ENCOUNTER — Encounter: Payer: Medicare Other | Admitting: Speech Pathology

## 2018-09-03 ENCOUNTER — Encounter: Payer: Medicare Other | Admitting: Speech Pathology

## 2018-09-12 ENCOUNTER — Ambulatory Visit (INDEPENDENT_AMBULATORY_CARE_PROVIDER_SITE_OTHER): Payer: Medicare Other

## 2018-09-12 DIAGNOSIS — I639 Cerebral infarction, unspecified: Secondary | ICD-10-CM

## 2018-09-12 DIAGNOSIS — Z5181 Encounter for therapeutic drug level monitoring: Secondary | ICD-10-CM | POA: Diagnosis not present

## 2018-09-12 DIAGNOSIS — Z8673 Personal history of transient ischemic attack (TIA), and cerebral infarction without residual deficits: Secondary | ICD-10-CM | POA: Diagnosis not present

## 2018-09-12 LAB — POCT INR: INR: 4.2 — AB (ref 2.0–3.0)

## 2018-09-12 NOTE — Patient Instructions (Signed)
Please skip coumadin tonight, then continue dosage of 1 pill every day except 1/2 tablets on Mondays & Fridays.  Recheck in 4 weeks.

## 2018-09-17 ENCOUNTER — Encounter: Payer: Medicare Other | Admitting: Family Medicine

## 2018-09-21 DIAGNOSIS — I10 Essential (primary) hypertension: Secondary | ICD-10-CM | POA: Diagnosis not present

## 2018-09-21 DIAGNOSIS — Z9884 Bariatric surgery status: Secondary | ICD-10-CM | POA: Diagnosis not present

## 2018-09-25 ENCOUNTER — Ambulatory Visit: Payer: Medicare Other | Attending: Family Medicine | Admitting: Family Medicine

## 2018-09-25 ENCOUNTER — Encounter: Payer: Self-pay | Admitting: Family Medicine

## 2018-09-25 ENCOUNTER — Other Ambulatory Visit (HOSPITAL_COMMUNITY)
Admission: RE | Admit: 2018-09-25 | Discharge: 2018-09-25 | Disposition: A | Discharge status: Home / Self Care | Payer: Medicare Other | Source: Ambulatory Visit | Attending: Family Medicine | Admitting: Family Medicine

## 2018-09-25 VITALS — BP 128/74 | HR 58 | Temp 97.8°F | Ht 65.0 in | Wt 234.0 lb

## 2018-09-25 DIAGNOSIS — Z79899 Other long term (current) drug therapy: Secondary | ICD-10-CM | POA: Insufficient documentation

## 2018-09-25 DIAGNOSIS — Z7901 Long term (current) use of anticoagulants: Secondary | ICD-10-CM | POA: Insufficient documentation

## 2018-09-25 DIAGNOSIS — Z8673 Personal history of transient ischemic attack (TIA), and cerebral infarction without residual deficits: Secondary | ICD-10-CM | POA: Insufficient documentation

## 2018-09-25 DIAGNOSIS — Z1239 Encounter for other screening for malignant neoplasm of breast: Secondary | ICD-10-CM | POA: Diagnosis not present

## 2018-09-25 DIAGNOSIS — Z124 Encounter for screening for malignant neoplasm of cervix: Secondary | ICD-10-CM | POA: Diagnosis not present

## 2018-09-25 DIAGNOSIS — Z87891 Personal history of nicotine dependence: Secondary | ICD-10-CM | POA: Insufficient documentation

## 2018-09-25 DIAGNOSIS — I10 Essential (primary) hypertension: Secondary | ICD-10-CM | POA: Diagnosis not present

## 2018-09-25 DIAGNOSIS — E119 Type 2 diabetes mellitus without complications: Secondary | ICD-10-CM | POA: Insufficient documentation

## 2018-09-25 DIAGNOSIS — Z823 Family history of stroke: Secondary | ICD-10-CM | POA: Diagnosis not present

## 2018-09-25 DIAGNOSIS — Z833 Family history of diabetes mellitus: Secondary | ICD-10-CM | POA: Insufficient documentation

## 2018-09-25 DIAGNOSIS — Z0000 Encounter for general adult medical examination without abnormal findings: Secondary | ICD-10-CM | POA: Insufficient documentation

## 2018-09-25 LAB — POCT GLYCOSYLATED HEMOGLOBIN (HGB A1C): HBA1C, POC (PREDIABETIC RANGE): 5.7 % (ref 5.7–6.4)

## 2018-09-25 LAB — GLUCOSE, POCT (MANUAL RESULT ENTRY): POC GLUCOSE: 115 mg/dL — AB (ref 70–99)

## 2018-09-25 NOTE — Patient Instructions (Signed)

## 2018-09-26 ENCOUNTER — Encounter: Payer: Self-pay | Admitting: Family Medicine

## 2018-09-26 LAB — MICROALBUMIN / CREATININE URINE RATIO
Creatinine, Urine: 101.8 mg/dL
Microalb/Creat Ratio: 3.9 mg/g creat (ref 0.0–30.0)
Microalbumin, Urine: 4 ug/mL

## 2018-09-26 NOTE — Progress Notes (Signed)
Subjective:   Mary [name] is a 52 y.o. female who presents for an Initial Medicare Annual Wellness Visit.  Review of Systems    General: negative for fever, weight loss, appetite change Eyes: no visual symptoms. ENT: no ear symptoms, no sinus tenderness, no nasal congestion or sore throat. Neck: no pain  Respiratory: no wheezing, shortness of breath, cough Cardiovascular: no chest pain, no dyspnea on exertion, no pedal edema, no orthopnea. Gastrointestinal: no abdominal pain, no diarrhea, no constipation Genito-Urinary: no urinary frequency, no dysuria, no polyuria. Hematologic: no bruising Endocrine: no cold or heat intolerance Neurological: no headaches, no seizures, no tremors Musculoskeletal: no joint pains, no joint swelling Skin: no pruritus, no rash. Psychological: no depression, no anxiety,           Objective:    Today's Vitals   09/25/18 1519  BP: 128/74  Pulse: (!) 58  Temp: 97.8 F (36.6 C)  TempSrc: Oral  SpO2: 98%  Weight: 234 lb (106.1 kg)  Height: 5\' 5"  (1.651 m)   Body mass index is 38.94 kg/m.   Constitutional: normal appearing,  Eyes: PERRLA HEENT: Head is atraumatic, normal sinuses, normal oropharynx, normal appearing tonsils and palate, tympanic membrane is normal bilaterally. Neck: normal range of motion, no thyromegaly, no JVD Cardiovascular: Tachycardic rate, normal rhythm, normal heart sounds, no murmurs, rub or gallop, no pedal edema Breasts: No palpable masses, no tenderness Respiratory: clear to auscultation bilaterally, no wheezes, no rales, no rhonchi Abdomen: soft, not tender to palpation, normal bowel sounds, no enlarged organs Extremities: Full ROM, no tenderness in joints Genitourinary: External genitalia, vagina, cervix, adnexa-normal Skin: warm and dry, no lesions. Neurological: alert, oriented x3, cranial nerves I-XII grossly intact , normal motor strength, normal sensation. Psychological: normal mood.   Advanced  Directives 09/25/2018 08/02/2018 02/01/2018 04/25/2017 01/30/2017 06/27/2016 06/27/2016  Does Patient Have a Medical Advance Directive? No No No No No No No  Would patient like information on creating a medical advance directive? No - Patient declined No - Patient declined No - Patient declined - No - Patient declined - -  Pre-existing out of facility DNR order (yellow form or pink MOST form) - - - - - - -    Current Medications (verified) Outpatient Encounter Medications as of 09/25/2018  Medication Sig  . amLODipine (NORVASC) 10 MG tablet amlodipine 10 mg tablet  . atorvastatin (LIPITOR) 20 MG tablet Take 1 tablet (20 mg total) by mouth daily.  Marland Kitchen docusate sodium (COLACE) 100 MG capsule Take 100 mg by mouth daily as needed for mild constipation.  . fluticasone (FLONASE) 50 MCG/ACT nasal spray Place 2 sprays into both nostrils daily.  . hydrochlorothiazide (HYDRODIURIL) 25 MG tablet Take 1 tablet (25 mg total) by mouth daily.  . metoprolol tartrate (LOPRESSOR) 25 MG tablet TAKE 25 MG BY MOUTH TWICE DAILY  . potassium chloride SA (K-DUR,KLOR-CON) 20 MEQ tablet Take 1 tablet (20 mEq total) by mouth daily.  . Vitamin D, Ergocalciferol, (DRISDOL) 50000 units CAPS capsule Take 1 capsule (50,000 Units total) by mouth every 7 (seven) days.  Marland Kitchen warfarin (COUMADIN) 5 MG tablet TAKE AS DIRECTED BY  COUMADIN  CLINIC   No facility-administered encounter medications on file as of 09/25/2018.     Allergies (verified) Patient has no known allergies.   History: Past Medical History:  Diagnosis Date  . Blood transfusion without reported diagnosis    transfusion December 2016  . Clotting disorder (HCC)    left leg  . Diabetes mellitus without  complication ([place])   . Hypertension   . Other and unspecified ovarian cysts   . Stroke [facility])    Past Surgical History:  Procedure Laterality Date  . COLONOSCOPY WITH PROPOFOL N/A 11/10/2015   Procedure: COLONOSCOPY WITH PROPOFOL;  Surgeon: Lucilla Lame, MD;  Location:  [facility] ENDOSCOPY;  Service: Endoscopy;  Laterality: N/A;  . ESOPHAGOGASTRODUODENOSCOPY (EGD) WITH PROPOFOL N/A 11/10/2015   Procedure: ESOPHAGOGASTRODUODENOSCOPY (EGD) WITH PROPOFOL;  Surgeon: Lucilla Lame, MD;  Location: [facility] ENDOSCOPY;  Service: Endoscopy;  Laterality: N/A;  . LAPAROSCOPIC GASTRIC SLEEVE RESECTION  02/17/2016   placement  . LOOP RECORDER IMPLANT N/A 04/25/2014   Procedure: LOOP RECORDER IMPLANT;  Surgeon: Coralyn Mark, MD;  Location: [facility] CATH LAB;  Service: Cardiovascular;  Laterality: N/A;  . LOOP RECORDER REMOVAL N/A 02/01/2018   Procedure: LOOP RECORDER REMOVAL;  Surgeon: Deboraha Sprang, MD;  Location: [facility] CV LAB;  Service: Cardiovascular;  Laterality: N/A;  . OVARIAN CYST REMOVAL    . TEE WITHOUT CARDIOVERSION N/A 01/25/2013   Procedure: TRANSESOPHAGEAL ECHOCARDIOGRAM (TEE);  Surgeon: Birdie Riddle, MD;  Location: [facility] ENDOSCOPY;  Service: Cardiovascular;  Laterality: N/A;   Family History  Problem Relation Age of Onset  . Diabetes Mellitus II Mother   . Diabetes Mother   . Stroke Mother   . Transient ischemic attack Father   . Stroke Father   . Stroke Other   . Diabetes Other   . Diabetes Sister    Social History   Socioeconomic History  . Marital status: Single    Spouse name: Not on file  . Number of children: 2  . Years of education: 62  . Highest education level: Not on file  Occupational History  . Not on file  Social Needs  . Financial resource strain: Not on file  . Food insecurity:    Worry: Not on file    Inability: Not on file  . Transportation needs:    Medical: Not on file    Non-medical: Not on file  Tobacco Use  . Smoking status: Former Smoker    Packs/day: 1.00    Years: 10.00    Pack years: 10.00    Types: Cigarettes    Last attempt to quit: 07/14/2006    Years since quitting: 12.2  . Smokeless tobacco: Never Used  . Tobacco comment: quit smoking around 2007  Substance and Sexual Activity  . Alcohol use: No     Alcohol/week: 0.0 standard drinks  . Drug use: No  . Sexual activity: Yes  Lifestyle  . Physical activity:    Days per week: Not on file    Minutes per session: Not on file  . Stress: Not on file  Relationships  . Social connections:    Talks on phone: Not on file    Gets together: Not on file    Attends religious service: Not on file    Active member of club or organization: Not on file    Attends meetings of clubs or organizations: Not on file    Relationship status: Not on file  Other Topics Concern  . Not on file  Social History Narrative   Patient is single with two children.   Patient is right handed.   Patient has 13 yrs of education.   Patient drinks 5 sodas daily.    Tobacco Counseling Counseling given: Not Answered Comment: quit smoking around 2007   Clinical Intake:  Pre-visit preparation completed: Yes  Pain : No/denies pain  Diabetes: Yes CBG done?: Yes CBG resulted in Enter/ Edit results?: Yes Did pt. bring in CBG monitor from home?: No     Interpreter Needed?: No      Activities of Daily Living No flowsheet data found.    Immunizations and Health Maintenance Immunization History  Administered Date(s) Administered  . PPD Test 04/12/2018  . Tdap 01/30/2017   Health Maintenance Due  Topic Date Due  . PNEUMOCOCCAL POLYSACCHARIDE VACCINE AGE 70-64 HIGH RISK  10/10/1967  . FOOT EXAM  10/10/1975  . OPHTHALMOLOGY EXAM  10/10/1975  . URINE MICROALBUMIN  10/10/1975  . PAP SMEAR  10/09/1986  . MAMMOGRAM  03/12/2016  . INFLUENZA VACCINE  07/19/2018    Patient Care Team: Maren Reamer, MD (Inactive) as PCP - General (Internal Medicine) [facility] (unable to recall name) Coumadin clinic-for anticoagulation    Assessment:   This is a routine wellness examination for [place].  Hearing/Vision screen No exam data present  Dietary issues and exercise activities discussed:    Goals   None    Depression Screen PHQ  2/9 Scores 07/30/2018 04/26/2018 04/25/2017 01/30/2017 06/27/2016 06/27/2016 05/09/2016  PHQ - 2 Score 2 4 0 3 0 0 2  PHQ- 9 Score 6 10 - 9 7 7 4     Fall Risk Fall Risk  09/25/2018 05/09/2016 02/29/2016 02/24/2016 12/16/2014  Falls in the past year? No No No No No    Is the patient's home free of loose throw rugs in walkways, pet beds, electrical cords, etc?   yes      Grab bars in the bathroom? yes      Handrails on the stairs?   yes      Adequate lighting?   yes  Timed Get Up and Go Performed yes   Cognitive Function: Normal        Screening Tests Health Maintenance  Topic Date Due  . PNEUMOCOCCAL POLYSACCHARIDE VACCINE AGE 70-64 HIGH RISK  10/10/1967  . FOOT EXAM  10/10/1975  . OPHTHALMOLOGY EXAM  10/10/1975  . URINE MICROALBUMIN  10/10/1975  . PAP SMEAR  10/09/1986  . MAMMOGRAM  03/12/2016  . INFLUENZA VACCINE  07/19/2018  . HEMOGLOBIN A1C  03/27/2019  . COLONOSCOPY  11/09/2020  . TETANUS/TDAP  01/30/2027  . HIV Screening  Completed    Qualifies for Shingles Vaccine? yes  Cancer Screenings: Lung: Low Dose CT Chest recommended if Age 33-80 years, 30 pack-year currently smoking OR have quit w/in 15years. Patient does not qualify. Breast: Up to date on Mammogram? No   Up to date of Bone Density/Dexa? No Colorectal: Done in 10/2015  Additional Screenings:  Hepatitis C Screening:      Plan:    1. Encounter for Medicare annual wellness exam Counseled on 150 minutes of exercise per week, healthy eating (including decreased daily intake of saturated fats, cholesterol, added sugars, sodium), STI prevention, routine healthcare maintenance.   2. Screening for malignant neoplasm of breast - Mammogram Digital Screening; Future  3. Screening for malignant neoplasm of cervix - Cytology - PAP  4. Type 2 diabetes mellitus without complication, without long-term current use of insulin (HCC) Controlled with A1c of 5.7 - POCT glucose (manual entry) - POCT glycosylated hemoglobin (Hb  A1C) - Microalbumin/Creatinine Ratio, Urine   I have personally reviewed and noted the following in the patient's chart:   . Medical and social history . Use of alcohol, tobacco or illicit drugs  . Current medications and supplements . Functional ability and status . Nutritional  status . Physical activity . Advanced directives . List of other physicians . Hospitalizations, surgeries, and ER visits in previous 12 months . Vitals . Screenings to include cognitive, depression, and falls . Referrals and appointments  In addition, I have reviewed and discussed with patient certain preventive protocols, quality metrics, and best practice recommendations. A written personalized care plan for preventive services as well as general preventive health recommendations were provided to patient.     [name], MD   09/26/2018

## 2018-09-27 LAB — CYTOLOGY - PAP: HPV: NOT DETECTED

## 2018-10-10 ENCOUNTER — Ambulatory Visit (INDEPENDENT_AMBULATORY_CARE_PROVIDER_SITE_OTHER): Payer: Medicare Other

## 2018-10-10 DIAGNOSIS — Z8673 Personal history of transient ischemic attack (TIA), and cerebral infarction without residual deficits: Secondary | ICD-10-CM

## 2018-10-10 DIAGNOSIS — I639 Cerebral infarction, unspecified: Secondary | ICD-10-CM | POA: Diagnosis not present

## 2018-10-10 DIAGNOSIS — G4733 Obstructive sleep apnea (adult) (pediatric): Secondary | ICD-10-CM | POA: Diagnosis not present

## 2018-10-10 DIAGNOSIS — Z9884 Bariatric surgery status: Secondary | ICD-10-CM | POA: Diagnosis not present

## 2018-10-10 DIAGNOSIS — Z5181 Encounter for therapeutic drug level monitoring: Secondary | ICD-10-CM

## 2018-10-10 DIAGNOSIS — Z6838 Body mass index (BMI) 38.0-38.9, adult: Secondary | ICD-10-CM | POA: Diagnosis not present

## 2018-10-10 LAB — POCT INR: INR: 2.9 (ref 2.0–3.0)

## 2018-10-10 NOTE — Patient Instructions (Signed)
Please continue dosage of 1 pill every day except 1/2 tablets on Mondays & Fridays.  Recheck in 5 weeks. 

## 2018-11-14 ENCOUNTER — Ambulatory Visit (INDEPENDENT_AMBULATORY_CARE_PROVIDER_SITE_OTHER): Payer: Medicare Other

## 2018-11-14 DIAGNOSIS — Z5181 Encounter for therapeutic drug level monitoring: Secondary | ICD-10-CM

## 2018-11-14 DIAGNOSIS — Z8673 Personal history of transient ischemic attack (TIA), and cerebral infarction without residual deficits: Secondary | ICD-10-CM

## 2018-11-14 DIAGNOSIS — I639 Cerebral infarction, unspecified: Secondary | ICD-10-CM | POA: Diagnosis not present

## 2018-11-14 LAB — POCT INR: INR: 3.8 — AB (ref 2.0–3.0)

## 2018-11-14 NOTE — Patient Instructions (Signed)
Please have a large serving of greens today & tomorrow, and continue dosage of 1 pill every day except 1/2 tablets on Mondays & Fridays.  Recheck in 6 weeks.

## 2018-11-26 ENCOUNTER — Ambulatory Visit
Admission: RE | Admit: 2018-11-26 | Discharge: 2018-11-26 | Disposition: A | Discharge status: Home / Self Care | Payer: Medicare Other | Source: Ambulatory Visit | Attending: Family Medicine | Admitting: Family Medicine

## 2018-11-26 DIAGNOSIS — Z1231 Encounter for screening mammogram for malignant neoplasm of breast: Secondary | ICD-10-CM | POA: Diagnosis not present

## 2018-11-26 DIAGNOSIS — Z1239 Encounter for other screening for malignant neoplasm of breast: Secondary | ICD-10-CM

## 2018-11-26 IMAGING — MG DIGITAL SCREENING BILATERAL MAMMOGRAM WITH CAD
4 series · 4 of 4 positions shown · non-contrast
Comparison: Previous exam(s).

CLINICAL DATA: Screening.

EXAM:
DIGITAL SCREENING BILATERAL MAMMOGRAM WITH CAD

[R CC]
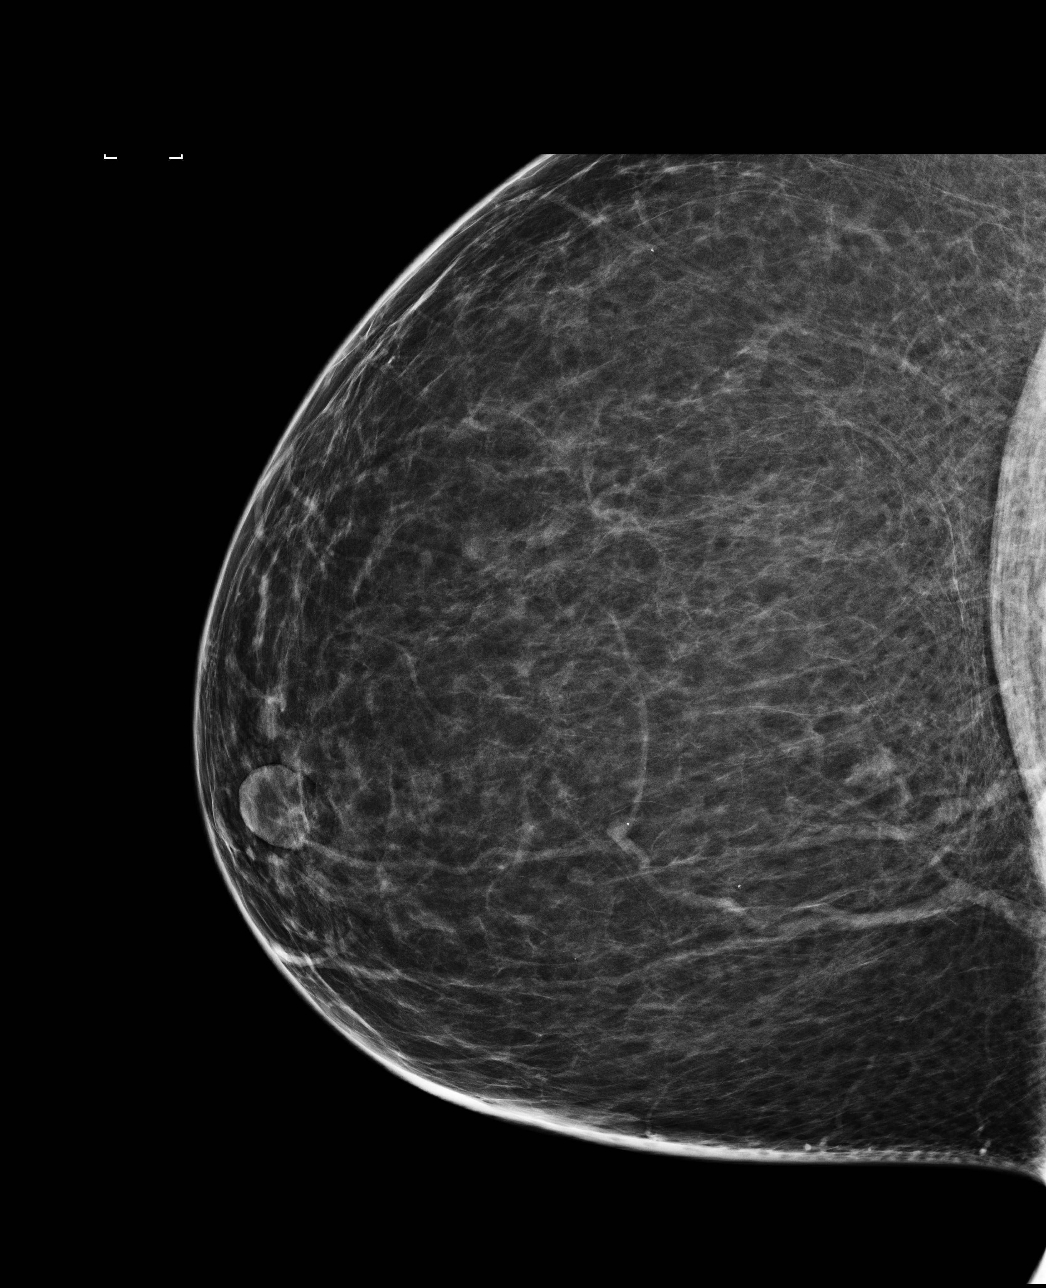

[L MLO]
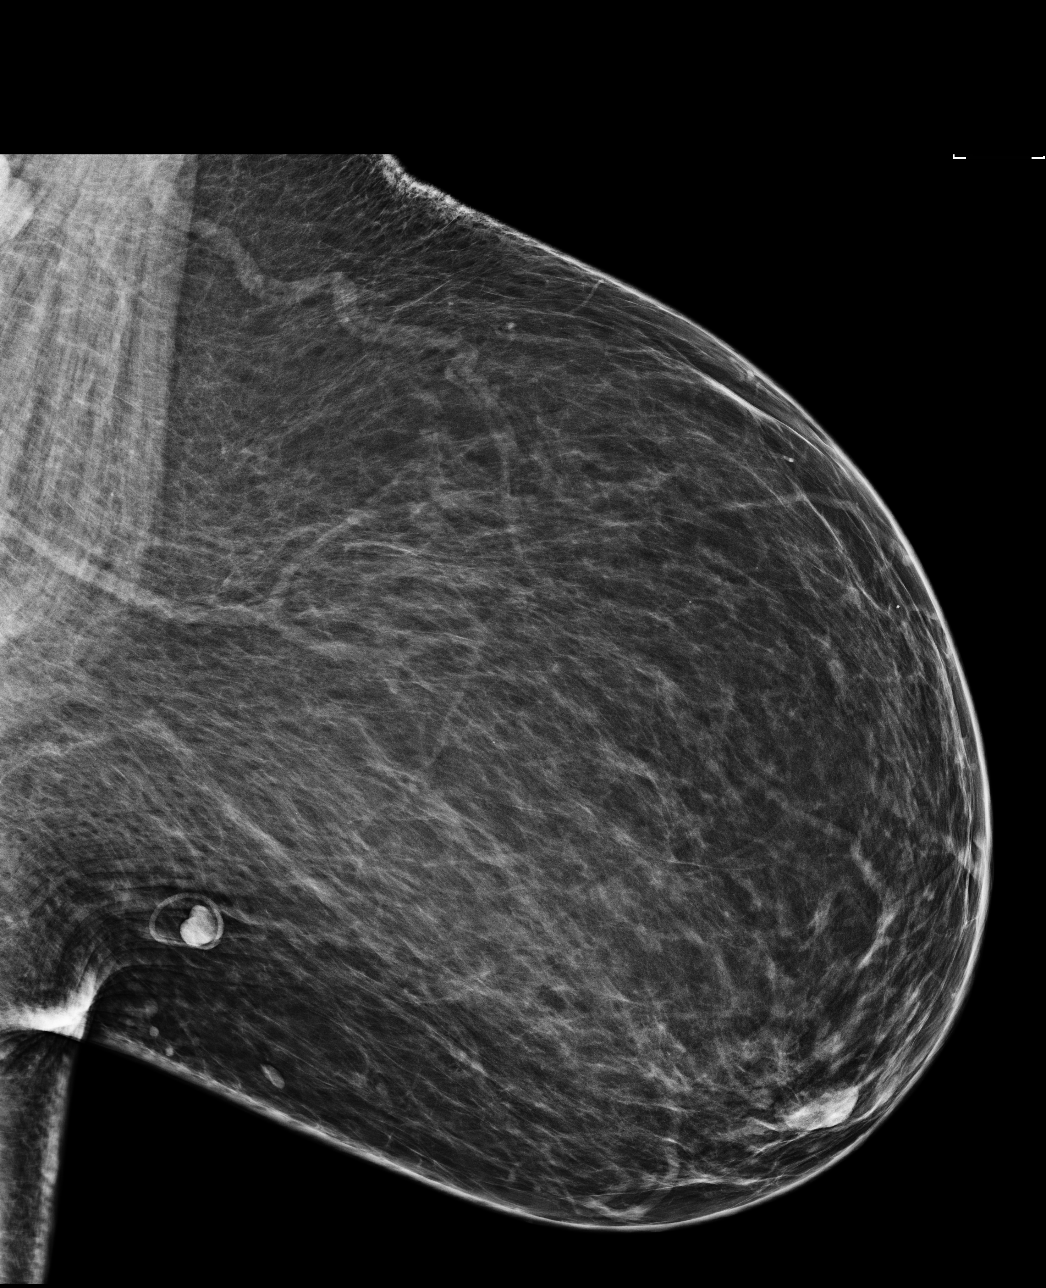

[L CC]
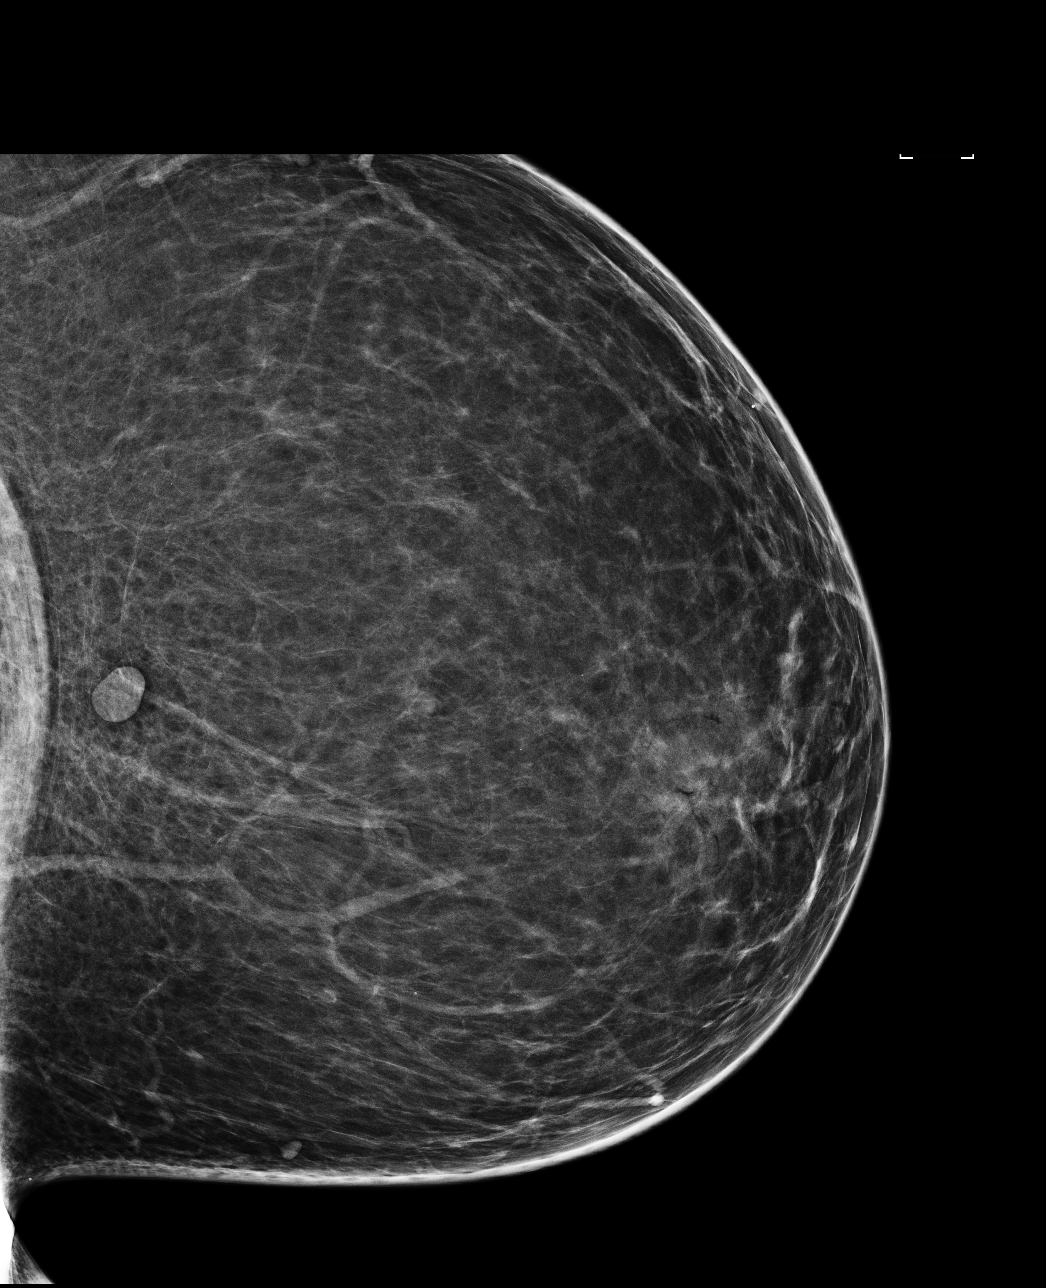

[R MLO]
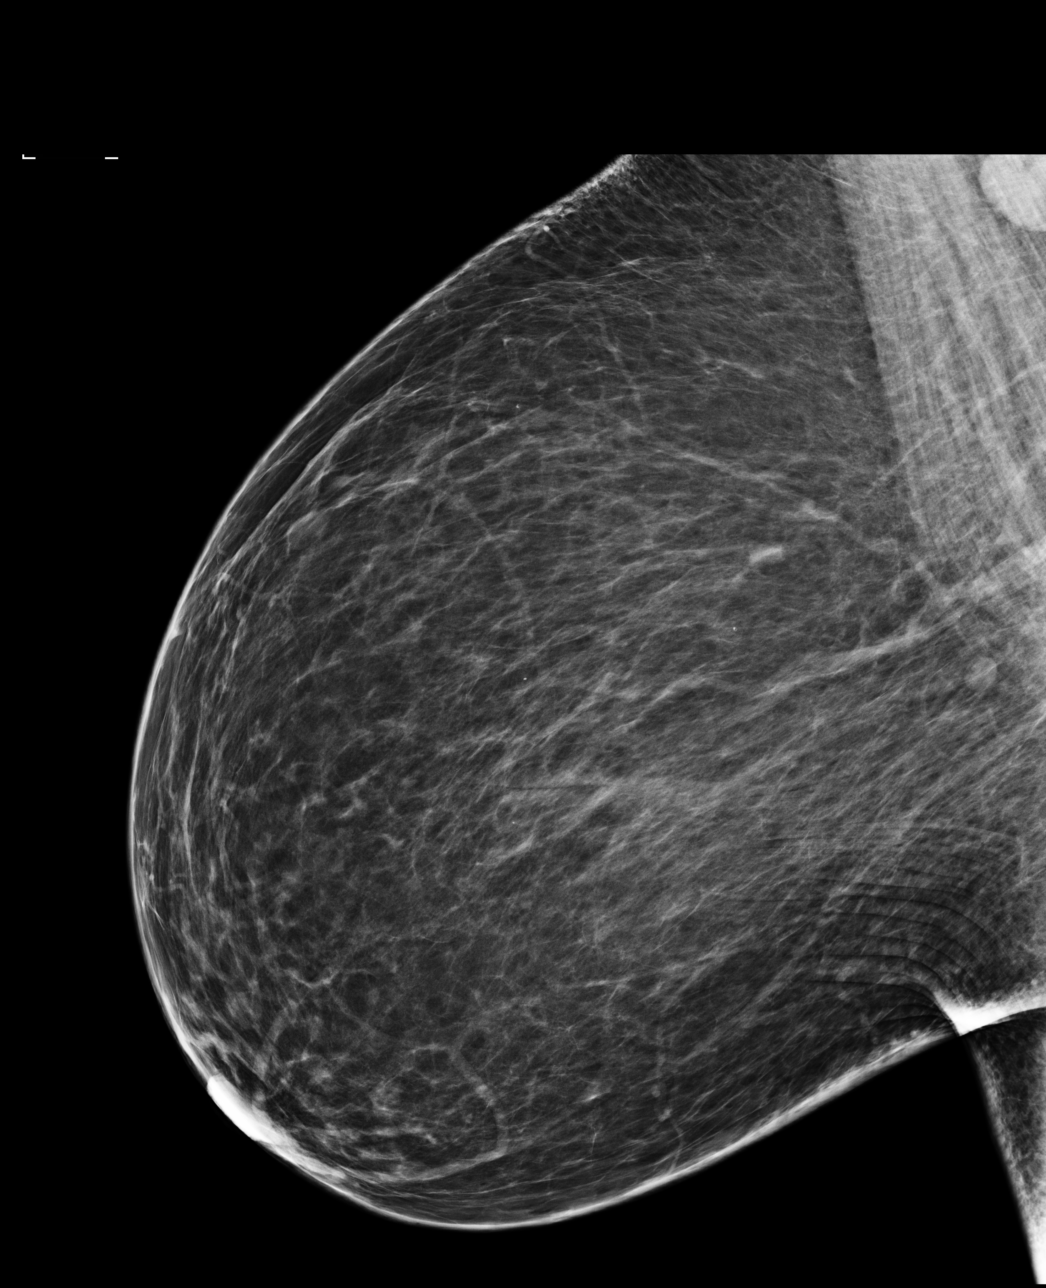

[4 of 4 positions shown; findings below may reference images not displayed]

ACR Breast Density Category b: There are scattered areas of
fibroglandular density.
FINDINGS: There are no findings suspicious for malignancy. Images were
processed with CAD.
IMPRESSION: No mammographic evidence of malignancy. A result letter of this
screening mammogram will be mailed directly to the patient.

RECOMMENDATION:
Screening mammogram in one year. (Code:[US])

BI-RADS CATEGORY  1: Negative.

## 2018-12-26 ENCOUNTER — Ambulatory Visit (INDEPENDENT_AMBULATORY_CARE_PROVIDER_SITE_OTHER): Payer: Medicare Other

## 2018-12-26 DIAGNOSIS — Z5181 Encounter for therapeutic drug level monitoring: Secondary | ICD-10-CM

## 2018-12-26 DIAGNOSIS — Z8673 Personal history of transient ischemic attack (TIA), and cerebral infarction without residual deficits: Secondary | ICD-10-CM

## 2018-12-26 DIAGNOSIS — I639 Cerebral infarction, unspecified: Secondary | ICD-10-CM

## 2018-12-26 LAB — POCT INR: INR: 2.1 (ref 2.0–3.0)

## 2018-12-26 NOTE — Patient Instructions (Signed)
Please take extra 1/2 tablet tonight, then continue dosage of 1 pill every day except 1/2 tablets on Mondays & Fridays.  Recheck in 5 weeks.

## 2019-01-29 ENCOUNTER — Ambulatory Visit: Payer: Medicare Other | Attending: Family Medicine | Admitting: Family Medicine

## 2019-01-29 ENCOUNTER — Encounter: Payer: Self-pay | Admitting: Family Medicine

## 2019-01-29 VITALS — BP 148/77 | HR 63 | Temp 98.0°F | Ht 65.0 in | Wt 227.6 lb

## 2019-01-29 DIAGNOSIS — Z8673 Personal history of transient ischemic attack (TIA), and cerebral infarction without residual deficits: Secondary | ICD-10-CM | POA: Diagnosis not present

## 2019-01-29 DIAGNOSIS — Z833 Family history of diabetes mellitus: Secondary | ICD-10-CM | POA: Insufficient documentation

## 2019-01-29 DIAGNOSIS — E119 Type 2 diabetes mellitus without complications: Secondary | ICD-10-CM | POA: Insufficient documentation

## 2019-01-29 DIAGNOSIS — I10 Essential (primary) hypertension: Secondary | ICD-10-CM | POA: Insufficient documentation

## 2019-01-29 DIAGNOSIS — D6861 Antiphospholipid syndrome: Secondary | ICD-10-CM | POA: Diagnosis not present

## 2019-01-29 DIAGNOSIS — Z7901 Long term (current) use of anticoagulants: Secondary | ICD-10-CM | POA: Insufficient documentation

## 2019-01-29 DIAGNOSIS — Z79899 Other long term (current) drug therapy: Secondary | ICD-10-CM | POA: Insufficient documentation

## 2019-01-29 MED ORDER — HYDROCHLOROTHIAZIDE 25 MG PO TABS: 25.0000 mg | ORAL_TABLET | Freq: Every day | ORAL | 6 refills | Status: DC

## 2019-01-29 MED ORDER — METOPROLOL TARTRATE 25 MG PO TABS: ORAL_TABLET | ORAL | 6 refills | Status: DC

## 2019-01-29 MED ORDER — AMLODIPINE BESYLATE 10 MG PO TABS: 10.0000 mg | ORAL_TABLET | Freq: Every day | ORAL | 6 refills | Status: DC

## 2019-01-29 MED ORDER — ATORVASTATIN CALCIUM 20 MG PO TABS: 20.0000 mg | ORAL_TABLET | Freq: Every day | ORAL | 6 refills | Status: DC

## 2019-01-29 NOTE — Progress Notes (Signed)
Subjective:  Patient ID: Mary [name], female    DOB: 1966/07/12  Age: 53 y.o. MRN: [id number]  CC: Hypertension   HPI Mary [name] is a 53 year old female with history of type 2 diabetes mellitus (diet controlled with A1c of 5.7) antiphospholipid syndrome (on chronic anticoagulation with Coumadin, followed by the Coumadin clinic), previous stroke with no residual deficits, hypertension who presents today for a follow-up visit. Denies bruising or bleeding from Coumadin and last INR was 2.1 at the Coumadin clinic. Compliant with her antihypertensive and denies adverse effects from her medications. Also on high-dose statin due to history of stroke with no complaints of myalgias.  Denies chest pains, dyspnea, pedal edema. She has no concerns at this time.  Past Medical History:  Diagnosis Date  . Blood transfusion without reported diagnosis    transfusion December 2016  . Clotting disorder (HCC)    left leg  . Diabetes mellitus without complication ([place])   . Hypertension   . Other and unspecified ovarian cysts   . Stroke [facility])     Past Surgical History:  Procedure Laterality Date  . COLONOSCOPY WITH PROPOFOL N/A 11/10/2015   Procedure: COLONOSCOPY WITH PROPOFOL;  Surgeon: Lucilla Lame, MD;  Location: [facility] ENDOSCOPY;  Service: Endoscopy;  Laterality: N/A;  . ESOPHAGOGASTRODUODENOSCOPY (EGD) WITH PROPOFOL N/A 11/10/2015   Procedure: ESOPHAGOGASTRODUODENOSCOPY (EGD) WITH PROPOFOL;  Surgeon: Lucilla Lame, MD;  Location: [facility] ENDOSCOPY;  Service: Endoscopy;  Laterality: N/A;  . LAPAROSCOPIC GASTRIC SLEEVE RESECTION  02/17/2016   placement  . LOOP RECORDER IMPLANT N/A 04/25/2014   Procedure: LOOP RECORDER IMPLANT;  Surgeon: Coralyn Mark, MD;  Location: [place] CATH LAB;  Service: Cardiovascular;  Laterality: N/A;  . LOOP RECORDER REMOVAL N/A 02/01/2018   Procedure: LOOP RECORDER REMOVAL;  Surgeon: Deboraha Sprang, MD;  Location: [place] CV LAB;  Service: Cardiovascular;   Laterality: N/A;  . OVARIAN CYST REMOVAL    . TEE WITHOUT CARDIOVERSION N/A 01/25/2013   Procedure: TRANSESOPHAGEAL ECHOCARDIOGRAM (TEE);  Surgeon: Birdie Riddle, MD;  Location: [facility] ENDOSCOPY;  Service: Cardiovascular;  Laterality: N/A;    Family History  Problem Relation Age of Onset  . Diabetes Mellitus II Mother   . Diabetes Mother   . Stroke Mother   . Transient ischemic attack Father   . Stroke Father   . Stroke Other   . Diabetes Other   . Diabetes Sister     No Known Allergies  Outpatient Medications Prior to Visit  Medication Sig Dispense Refill  . docusate sodium (COLACE) 100 MG capsule Take 100 mg by mouth daily as needed for mild constipation.    . fluticasone (FLONASE) 50 MCG/ACT nasal spray Place 2 sprays into both nostrils daily. 16 g 6  . Vitamin D, Ergocalciferol, (DRISDOL) 50000 units CAPS capsule Take 1 capsule (50,000 Units total) by mouth every 7 (seven) days. 12 capsule 0  . warfarin (COUMADIN) 5 MG tablet TAKE AS DIRECTED BY  COUMADIN  CLINIC 100 tablet 0  . amLODipine (NORVASC) 10 MG tablet amlodipine 10 mg tablet    . atorvastatin (LIPITOR) 20 MG tablet Take 1 tablet (20 mg total) by mouth daily. 30 tablet 3  . hydrochlorothiazide (HYDRODIURIL) 25 MG tablet Take 1 tablet (25 mg total) by mouth daily. 30 tablet 5  . metoprolol tartrate (LOPRESSOR) 25 MG tablet TAKE 25 MG BY MOUTH TWICE DAILY 180 tablet 3  . potassium chloride SA (K-DUR,KLOR-CON) 20 MEQ tablet Take 1 tablet (20 mEq total) by mouth  daily. (Patient not taking: Reported on 01/29/2019) 30 tablet 1   No facility-administered medications prior to visit.      ROS Review of Systems General: negative for fever, weight loss, appetite change Eyes: no visual symptoms. ENT: no ear symptoms, no sinus tenderness, no nasal congestion or sore throat. Neck: no pain  Respiratory: no wheezing, shortness of breath, cough Cardiovascular: no chest pain, no dyspnea on exertion, no pedal edema, no  orthopnea. Gastrointestinal: no abdominal pain, no diarrhea, no constipation Genito-Urinary: no urinary frequency, no dysuria, no polyuria. Hematologic: no bruising Endocrine: no cold or heat intolerance Neurological: no headaches, no seizures, no tremors Musculoskeletal: no joint pains, no joint swelling Skin: no pruritus, no rash. Psychological: no depression, no anxiety,    Objective:  BP (!) 148/77   Pulse 63   Temp 98 F (36.7 C) (Oral)   Ht 5\' 5"  (1.651 m)   Wt 227 lb 9.6 oz (103.2 kg)   SpO2 100%   BMI 37.87 kg/m   BP/Weight 01/29/2019 09/25/2018 01/18/8656  Systolic BP 846 962 952  Diastolic BP 77 74 70  Wt. (Lbs) 227.6 234 229.6  BMI 37.87 38.94 38.21      Physical Exam Constitutional: normal appearing,  Eyes: PERRLA HEENT: Head is atraumatic, normal sinuses, normal oropharynx, normal appearing tonsils and palate, tympanic membrane is normal bilaterally. Neck: normal range of motion, no thyromegaly, no JVD Cardiovascular: normal rate and rhythm, normal heart sounds, no murmurs, rub or gallop, no pedal edema Respiratory: Normal breath sounds, clear to auscultation bilaterally, no wheezes, no rales, no rhonchi Abdomen: soft, not tender to palpation, normal bowel sounds, no enlarged organs Musculoskeletal: Full ROM, no tenderness in joints Skin: warm and dry, no lesions. Neurological: alert, oriented x3, cranial nerves I-XII grossly intact , normal motor strength, normal sensation. Psychological: normal mood.   CMP Latest Ref Rng & Units 04/26/2018 01/30/2017 05/09/2016  Glucose 65 - 99 mg/dL 103(H) 87 -  BUN 6 - 24 mg/dL 17 12 -  Creatinine 0.57 - 1.00 mg/dL 1.10(H) 0.75 -  Sodium 134 - 144 mmol/L 141 142 -  Potassium 3.5 - 5.2 mmol/L 3.0(L) 3.7 -  Chloride 96 - 106 mmol/L 101 107 -  CO2 20 - 29 mmol/L 26 28 -  Calcium 8.7 - 10.2 mg/dL 8.8 9.0 8.5  Total Protein 6.0 - 8.5 g/dL 7.4 - -  Total Bilirubin 0.0 - 1.2 mg/dL <0.2 - -  Alkaline Phos 39 - 117 IU/L 65 - -   AST 0 - 40 IU/L 17 - -  ALT 0 - 32 IU/L 18 - -    Lipid Panel     Component Value Date/Time   CHOL 152 04/26/2018 1454   TRIG 195 (H) 04/26/2018 1454   HDL 43 04/26/2018 1454   CHOLHDL 3.5 04/26/2018 1454   CHOLHDL 3.6 07/22/2015 0944   VLDL 27 07/22/2015 0944   LDLCALC 70 04/26/2018 1454    CBC    Component Value Date/Time   WBC 4.3 04/26/2018 1454   WBC 5.2 01/30/2017 1135   RBC 4.60 04/26/2018 1454   RBC 5.13 (H) 01/30/2017 1135   HGB 11.1 04/26/2018 1454   HGB 10.0 (L) 04/17/2015 0948   HCT 33.6 (L) 04/26/2018 1454   HCT 32.1 (L) 04/17/2015 0948   PLT 294 04/26/2018 1454   MCV 73 (L) 04/26/2018 1454   MCV 66.0 (L) 04/17/2015 0948   MCH 24.1 (L) 04/26/2018 1454   MCH 21.6 (L) 01/30/2017 1135   MCHC 33.0  04/26/2018 1454   MCHC 31.4 (L) 01/30/2017 1135   RDW 15.6 (H) 04/26/2018 1454   RDW 18.2 (H) 04/17/2015 0948   LYMPHSABS 1.7 04/26/2018 1454   LYMPHSABS 1.8 04/17/2015 0948   MONOABS 260 01/30/2017 1135   MONOABS 0.3 04/17/2015 0948   EOSABS 0.1 04/26/2018 1454   EOSABS 0.1 10/03/2014 1004   BASOSABS 0.1 04/26/2018 1454   BASOSABS 0.1 04/17/2015 0948    Lab Results  Component Value Date   HGBA1C 5.7 09/25/2018    Assessment & Plan:   1. Type 2 diabetes mellitus without complication, without long-term current use of insulin (HCC) Diet controlled with A1c of 5.7 Counseled on Diabetic diet, my plate method, 939 minutes of moderate intensity exercise/week Keep blood sugar logs with fasting goals of 80-120 mg/dl, random of less than 180 and in the event of sugars less than 60 mg/dl or greater than 400 mg/dl please notify the clinic ASAP. It is recommended that you undergo annual eye exams and annual foot exams. Pneumonia vaccine is recommended. - Basic Metabolic Panel - Hemoglobin A1c - Microalbumin / creatinine urine ratio  2. Essential hypertension Slightly elevated No regimen change today Counseled on blood pressure goal of less than 130/80,  low-sodium, DASH diet, medication compliance, 150 minutes of moderate intensity exercise per week. Discussed medication compliance, adverse effects. - hydrochlorothiazide (HYDRODIURIL) 25 MG tablet; Take 1 tablet (25 mg total) by mouth daily.  Dispense: 30 tablet; Refill: 6 - metoprolol tartrate (LOPRESSOR) 25 MG tablet; TAKE 25 MG BY MOUTH TWICE DAILY  Dispense: 60 tablet; Refill: 6 - amLODipine (NORVASC) 10 MG tablet; Take 1 tablet (10 mg total) by mouth daily.  Dispense: 30 tablet; Refill: 6  3. Antiphospholipid syndrome (HCC) Chronic anticoagulation with Coumadin Followed by the Coumadin clinic; last INR was 2.1  4. History of stroke Risk factor modification Currently on Coumadin - atorvastatin (LIPITOR) 20 MG tablet; Take 1 tablet (20 mg total) by mouth daily.  Dispense: 30 tablet; Refill: 6   Meds ordered this encounter  Medications  . atorvastatin (LIPITOR) 20 MG tablet    Sig: Take 1 tablet (20 mg total) by mouth daily.    Dispense:  30 tablet    Refill:  6  . hydrochlorothiazide (HYDRODIURIL) 25 MG tablet    Sig: Take 1 tablet (25 mg total) by mouth daily.    Dispense:  30 tablet    Refill:  6  . metoprolol tartrate (LOPRESSOR) 25 MG tablet    Sig: TAKE 25 MG BY MOUTH TWICE DAILY    Dispense:  60 tablet    Refill:  6  . amLODipine (NORVASC) 10 MG tablet    Sig: Take 1 tablet (10 mg total) by mouth daily.    Dispense:  30 tablet    Refill:  6    Follow-up: Return in about 6 months (around 07/30/2019) for Follow-up of chronic medical conditions.       [name], MD, FAAFP. [facility] and [place], [place]   01/29/2019, 10:51 AM

## 2019-01-30 ENCOUNTER — Ambulatory Visit (INDEPENDENT_AMBULATORY_CARE_PROVIDER_SITE_OTHER): Payer: Medicare Other

## 2019-01-30 DIAGNOSIS — I639 Cerebral infarction, unspecified: Secondary | ICD-10-CM | POA: Diagnosis not present

## 2019-01-30 DIAGNOSIS — Z8673 Personal history of transient ischemic attack (TIA), and cerebral infarction without residual deficits: Secondary | ICD-10-CM | POA: Diagnosis not present

## 2019-01-30 DIAGNOSIS — Z5181 Encounter for therapeutic drug level monitoring: Secondary | ICD-10-CM

## 2019-01-30 LAB — BASIC METABOLIC PANEL
BUN/Creatinine Ratio: 16 (ref 9–23)
BUN: 15 mg/dL (ref 6–24)
CO2: 28 mmol/L (ref 20–29)
Calcium: 9.1 mg/dL (ref 8.7–10.2)
Chloride: 101 mmol/L (ref 96–106)
Creatinine, Ser: 0.95 mg/dL (ref 0.57–1.00)
GFR calc non Af Amer: 69 mL/min/{1.73_m2} (ref >59)
GFR, EST AFRICAN AMERICAN: 79 mL/min/{1.73_m2} (ref >59)
Glucose: 77 mg/dL (ref 65–99)
Potassium: 3.7 mmol/L (ref 3.5–5.2)
SODIUM: 143 mmol/L (ref 134–144)

## 2019-01-30 LAB — HEMOGLOBIN A1C
Est. average glucose Bld gHb Est-mCnc: 128 mg/dL
Hgb A1c MFr Bld: 6.1 % — ABNORMAL HIGH (ref 4.8–5.6)

## 2019-01-30 LAB — POCT INR: INR: 3.8 — AB (ref 2.0–3.0)

## 2019-01-30 NOTE — Patient Instructions (Signed)
Please take 1/2 tablet tonight, then continue dosage of 1 pill every day except 1/2 tablets on Mondays & Fridays.  Recheck in 5 weeks.

## 2019-02-20 ENCOUNTER — Other Ambulatory Visit: Payer: Self-pay

## 2019-02-20 MED ORDER — WARFARIN SODIUM 5 MG PO TABS: ORAL_TABLET | ORAL | 0 refills | Status: DC

## 2019-03-06 ENCOUNTER — Other Ambulatory Visit: Payer: Self-pay

## 2019-03-06 ENCOUNTER — Ambulatory Visit (INDEPENDENT_AMBULATORY_CARE_PROVIDER_SITE_OTHER): Payer: Medicare Other

## 2019-03-06 DIAGNOSIS — Z8673 Personal history of transient ischemic attack (TIA), and cerebral infarction without residual deficits: Secondary | ICD-10-CM

## 2019-03-06 DIAGNOSIS — I639 Cerebral infarction, unspecified: Secondary | ICD-10-CM | POA: Diagnosis not present

## 2019-03-06 DIAGNOSIS — Z5181 Encounter for therapeutic drug level monitoring: Secondary | ICD-10-CM | POA: Diagnosis not present

## 2019-03-06 LAB — POCT INR: INR: 2.5 (ref 2.0–3.0)

## 2019-03-06 NOTE — Patient Instructions (Signed)
Please continue dosage of 1 pill every day except 1/2 tablets on Mondays & Fridays.  Recheck in 6 weeks.

## 2019-04-04 ENCOUNTER — Telehealth: Payer: Self-pay | Admitting: Family Medicine

## 2019-04-05 ENCOUNTER — Other Ambulatory Visit: Payer: Self-pay

## 2019-04-05 ENCOUNTER — Encounter: Payer: Self-pay | Admitting: Family Medicine

## 2019-04-05 ENCOUNTER — Ambulatory Visit: Payer: Medicare Other | Attending: Family Medicine | Admitting: Family Medicine

## 2019-04-05 DIAGNOSIS — J069 Acute upper respiratory infection, unspecified: Secondary | ICD-10-CM

## 2019-04-05 DIAGNOSIS — J301 Allergic rhinitis due to pollen: Secondary | ICD-10-CM

## 2019-04-05 MED ORDER — LORATADINE 10 MG PO TABS: 10.0000 mg | ORAL_TABLET | Freq: Every day | ORAL | 11 refills | Status: DC

## 2019-04-05 NOTE — Progress Notes (Signed)
Per pt sometimes she have nasal drainage which cause her to have sore throat,   Per pt no fever and her last temp was 97.2 last night  Per pt all of this started 3 days ago

## 2019-04-05 NOTE — Progress Notes (Signed)
Virtual Visit via Telephone Note  I connected with Mary [name] on 04/05/19 at 10:50 AM EDT by telephone and verified that I am speaking with the correct person using two identifiers.   I discussed the limitations, risks, security and privacy concerns of performing an evaluation and management service by telephone and the availability of in person appointments. I also discussed with the patient that there may be a patient responsible charge related to this service. The patient expressed understanding and agreed to proceed.  Patient location: Home Provider location: Office Patient was contacted by phone by Owens Loffler, RNA  History of Present Illness:      53 year old female with past medical history significant for CVA and hypertension who is currently on anticoagulant medication who reports that she has had 3 days of nasal congestion with clear nasal discharge, sensation of postnasal drainage, mild throat irritation/scratchy throat secondary to postnasal drainage as well as sneezing.  Patient denies any fever or chills.  She has had no sensation of shortness of breath, chest tightness or wheezing.  Patient has had no known exposure to anyone with COVID-19.  Patient states that she has been using her Flonase for seasonal allergic rhinitis as well as taking over-the-counter Robitussin-DM.  Patient states that she was not aware that she could take other medications for cough and nasal congestion due to being on blood thinning medication.  Patient states that she was concerned after developing her current symptoms due to presence of COVID-19.  Past Medical History:  Diagnosis Date  . Blood transfusion without reported diagnosis    transfusion December 2016  . Clotting disorder (HCC)    left leg  . Diabetes mellitus without complication ([place])   . Hypertension   . Other and unspecified ovarian cysts   . Stroke [facility])    Family History  Problem Relation Age of Onset  . Diabetes Mellitus II  Mother   . Diabetes Mother   . Stroke Mother   . Transient ischemic attack Father   . Stroke Father   . Stroke Other   . Diabetes Other   . Diabetes Sister    Past Surgical History:  Procedure Laterality Date  . COLONOSCOPY WITH PROPOFOL N/A 11/10/2015   Procedure: COLONOSCOPY WITH PROPOFOL;  Surgeon: Lucilla Lame, MD;  Location: [facility] ENDOSCOPY;  Service: Endoscopy;  Laterality: N/A;  . ESOPHAGOGASTRODUODENOSCOPY (EGD) WITH PROPOFOL N/A 11/10/2015   Procedure: ESOPHAGOGASTRODUODENOSCOPY (EGD) WITH PROPOFOL;  Surgeon: Lucilla Lame, MD;  Location: [facility] ENDOSCOPY;  Service: Endoscopy;  Laterality: N/A;  . LAPAROSCOPIC GASTRIC SLEEVE RESECTION  02/17/2016   placement  . LOOP RECORDER IMPLANT N/A 04/25/2014   Procedure: LOOP RECORDER IMPLANT;  Surgeon: Coralyn Mark, MD;  Location: [place] CATH LAB;  Service: Cardiovascular;  Laterality: N/A;  . LOOP RECORDER REMOVAL N/A 02/01/2018   Procedure: LOOP RECORDER REMOVAL;  Surgeon: Deboraha Sprang, MD;  Location: [place] CV LAB;  Service: Cardiovascular;  Laterality: N/A;  . OVARIAN CYST REMOVAL    . TEE WITHOUT CARDIOVERSION N/A 01/25/2013   Procedure: TRANSESOPHAGEAL ECHOCARDIOGRAM (TEE);  Surgeon: Birdie Riddle, MD;  Location: [facility] ENDOSCOPY;  Service: Cardiovascular;  Laterality: N/A;   Social History   Tobacco Use  . Smoking status: Former Smoker    Packs/day: 1.00    Years: 10.00    Pack years: 10.00    Types: Cigarettes    Last attempt to quit: 07/14/2006    Years since quitting: 12.7  . Smokeless tobacco: Never Used  . Tobacco comment:  quit smoking around 2007  Substance Use Topics  . Alcohol use: No    Alcohol/week: 0.0 standard drinks  . Drug use: No   No Known Allergies  Review of Systems  Constitutional: Negative for chills and fever.  HENT: Positive for congestion. Negative for sinus pain and sore throat (complaint of "scratchy throat" from postnasal drainage).   Respiratory: Positive for cough. Negative for shortness of  breath and wheezing.   Cardiovascular: Negative for chest pain and palpitations.  Gastrointestinal: Negative for abdominal pain, constipation, diarrhea and nausea.  Genitourinary: Negative for dysuria and frequency.  Musculoskeletal: Negative for back pain and myalgias.  Skin: Negative for itching and rash.  Neurological: Negative for dizziness and headaches.  Endo/Heme/Allergies: Positive for environmental allergies. Negative for polydipsia.     Observations/Objective: No vital signs done or physical examination as visit was conducted by telehealth/via telephone  Assessment and Plan: 1. URI with cough and congestion You may continue the use of Robitussin DM for cough and RX has been to pharmacy for patient to take loratadine 10 mg once daily to help with nasal congestion and sneezing.  Patient was made aware that she should avoid the use of decongestants secondary to her history of hypertension and prior stroke.  Patient should call or make office visit if she has onset of fever, increased shortness of breath, productive cough, discolored nasal drainage or any other concerns.  2. Seasonal allergic rhinitis due to pollen Continue use of Flonase and RX has been sent into pharmacy for loratadine 10 mg once per day to take as needed for sneezing/nasal congestion  Follow Up Instructions:as needed and if symptoms worsen as discussed    I discussed the assessment and treatment plan with the patient. The patient was provided an opportunity to ask questions and all were answered. The patient agreed with the plan and demonstrated an understanding of the instructions.   The patient was advised to call back or seek an in-person evaluation if the symptoms worsen or if the condition fails to improve as anticipated.  I provided 8 minutes of non-face-to-face time during this encounter.   Antony Blackbird, MD

## 2019-04-08 ENCOUNTER — Other Ambulatory Visit: Payer: Self-pay | Admitting: Family Medicine

## 2019-04-08 DIAGNOSIS — R87619 Unspecified abnormal cytological findings in specimens from cervix uteri: Secondary | ICD-10-CM

## 2019-04-08 NOTE — Progress Notes (Signed)
Called the patient over the phone after receipt of latter from Cytology Department to discuss the results of her Pap smear from 08/2018 which revealed atypical glandular cells, HPV negative.  She never received a my chart message sent to her last year at the time of her result. I have referred her to GYN for colposcopy given abnormal Pap smear.

## 2019-04-15 ENCOUNTER — Telehealth: Payer: Self-pay

## 2019-04-15 NOTE — Telephone Encounter (Signed)
1. Do you currently have a fever? No(yes = cancel and refer to pcp for e-visit) 2. Have you recently travelled on a cruise, internationally, or to [place], [place], [place], [place], [place], or [place], [place] ([place]) ? No (yes = cancel, stay home, monitor symptoms, and contact pcp or initiate e-visit if symptoms develop) 3. Have you been in contact with someone that is currently pending confirmation of Covid19 testing or has been confirmed to have the Covid19 virus?  No (yes = cancel, stay home, away from tested individual, monitor symptoms, and contact pcp or initiate e-visit if symptoms develop) 4. Are you currently experiencing fatigue or cough? No (yes = pt should be prepared to have a mask placed at the time of their visit).  Spoke w/ pt. Advised that we are restricting visitors at this time and anyone present in the vehicle should meet the above criteria as well. Advised that visit will be at curbside for finger stick ONLY and will receive call with instructions. Pt also advised to please bring own pen for signature of arrival document.   

## 2019-04-17 ENCOUNTER — Other Ambulatory Visit: Payer: Self-pay

## 2019-04-17 ENCOUNTER — Ambulatory Visit (INDEPENDENT_AMBULATORY_CARE_PROVIDER_SITE_OTHER): Payer: Medicare Other

## 2019-04-17 DIAGNOSIS — Z5181 Encounter for therapeutic drug level monitoring: Secondary | ICD-10-CM

## 2019-04-17 DIAGNOSIS — Z8673 Personal history of transient ischemic attack (TIA), and cerebral infarction without residual deficits: Secondary | ICD-10-CM | POA: Diagnosis not present

## 2019-04-17 DIAGNOSIS — I639 Cerebral infarction, unspecified: Secondary | ICD-10-CM | POA: Diagnosis not present

## 2019-04-17 LAB — POCT INR: INR: 2.1 (ref 2.0–3.0)

## 2019-04-17 NOTE — Patient Instructions (Signed)
Please take extra 1/2 tablet tonight, then continue dosage of 1 pill every day except 1/2 tablets on Mondays & Fridays.  Recheck in 6 weeks.

## 2019-05-20 ENCOUNTER — Telehealth: Payer: Self-pay | Admitting: Family Medicine

## 2019-05-20 NOTE — Telephone Encounter (Signed)
Patient called stating she needs a letter to go back to work because she left early due to not feeling good. Please follow up.

## 2019-05-21 NOTE — Telephone Encounter (Signed)
Patient was called to obtain more information for need of letter. Patient has a VM that has not been set up yet.

## 2019-05-22 ENCOUNTER — Other Ambulatory Visit: Payer: Self-pay | Admitting: *Deleted

## 2019-05-22 NOTE — Telephone Encounter (Signed)
Spoke to pt.  She request a note to be released back to work after a respiratory issues she had in April. She was advised that she would probably need an OV / Tele-vist with her PCP in order to release her back to work. Pt had an appointment prior to call for June 9. She states she will wait until then for the note.

## 2019-05-22 NOTE — Telephone Encounter (Signed)
Follow up   Pt calling to check on a note for work. Please f/u

## 2019-05-27 ENCOUNTER — Telehealth: Payer: Self-pay

## 2019-05-27 NOTE — Telephone Encounter (Signed)

## 2019-05-28 ENCOUNTER — Encounter: Payer: Self-pay | Admitting: Family Medicine

## 2019-05-28 ENCOUNTER — Ambulatory Visit: Payer: Medicare Other | Attending: Family Medicine | Admitting: Family Medicine

## 2019-05-28 ENCOUNTER — Other Ambulatory Visit: Payer: Self-pay

## 2019-05-28 DIAGNOSIS — R1084 Generalized abdominal pain: Secondary | ICD-10-CM

## 2019-05-28 NOTE — Progress Notes (Signed)
Virtual Visit via Telephone Note  I connected with Mary [name], on 05/28/2019 at 1:59 PM by telephone due to the COVID-19 pandemic and verified that I am speaking with the correct person using two identifiers.   Consent: I discussed the limitations, risks, security and privacy concerns of performing an evaluation and management service by telephone and the availability of in person appointments. I also discussed with the patient that there may be a patient responsible charge related to this service. The patient expressed understanding and agreed to proceed.   Location of Patient: Home  Location of Provider: Clinic   Persons participating in Telemedicine visit: Timea Breed [place]-CMA Dr. Felecia Shelling     History of Present Illness: GLORISTINE TURRUBIATES is a 53 year old female with history of type 2 diabetes mellitus (diet controlled with A1c of 5.7) antiphospholipid syndrome (on chronic anticoagulation with Coumadin, followed by the Coumadin clinic), previous stroke with no residual deficits, hypertension who presents today requesting a note to return to work. 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  She denied fever, myalgias and was not diagnosed with COVID-19.  Symptoms resolved after 1 week and she feels fine at this time and would like to return to work next Monday.  I had referred her to GYN due to finding of atypical glandular cells on Pap smear from 09/2018 and she has an upcoming appointment next week. She has no additional concerns today.   Past Medical History:  Diagnosis Date  . Blood transfusion without reported diagnosis    transfusion December 2016  . Clotting disorder (HCC)    left leg  . Diabetes mellitus without complication ([place])   . Hypertension   . Other and unspecified ovarian cysts   . Stroke [facility])    No Known Allergies  Current Outpatient  Medications on File Prior to Visit  Medication Sig Dispense Refill  . amLODipine (NORVASC) 10 MG tablet Take 1 tablet (10 mg total) by mouth daily. 30 tablet 6  . docusate sodium (COLACE) 100 MG capsule Take 100 mg by mouth daily as needed for mild constipation.    . fluticasone (FLONASE) 50 MCG/ACT nasal spray Place 2 sprays into both nostrils daily. 16 g 6  . hydrochlorothiazide (HYDRODIURIL) 25 MG tablet Take 1 tablet (25 mg total) by mouth daily. 30 tablet 6  . loratadine (CLARITIN) 10 MG tablet Take 1 tablet (10 mg total) by mouth daily. To help with sneezing/nasal congestion 30 tablet 11  . metoprolol tartrate (LOPRESSOR) 25 MG tablet TAKE 25 MG BY MOUTH TWICE DAILY 60 tablet 6  . Vitamin D, Ergocalciferol, (DRISDOL) 50000 units CAPS capsule Take 1 capsule (50,000 Units total) by mouth every 7 (seven) days. 12 capsule 0  . warfarin (COUMADIN) 5 MG tablet TAKE AS DIRECTED BY  COUMADIN  CLINIC 100 tablet 0  . atorvastatin (LIPITOR) 20 MG tablet Take 1 tablet (20 mg total) by mouth daily. (Patient not taking: Reported on 04/05/2019) 30 tablet 6  . potassium chloride SA (K-DUR,KLOR-CON) 20 MEQ tablet Take 1 tablet (20 mEq total) by mouth daily. (Patient not taking: Reported on 01/29/2019) 30 tablet 1   No current facility-administered medications on file prior to visit.     Observations/Objective: Awake, Alert, oriented x3 Not in acute distress  Assessment and Plan: 1. Generalized abdominal pain Solved Provided note to return to work   Follow Up  Instructions: For follow-up of chronic medical conditions, keep previously scheduled appointment   I discussed the assessment and treatment plan with the patient. The patient was provided an opportunity to ask questions and all were answered. The patient agreed with the plan and demonstrated an understanding of the instructions.   The patient was advised to call back or seek an in-person evaluation if the symptoms worsen or if the condition  fails to improve as anticipated.     I provided 10 minutes total of non-face-to-face time during this encounter including median intraservice time, reviewing previous notes, labs, imaging, medications, management and patient verbalized understanding.     [name], MD, FAAFP. [facility] and [place], [place]   05/28/2019, 1:59 PM

## 2019-05-28 NOTE — Progress Notes (Signed)
Patient has been called and DOB has been verified. Patient has been screened and transferred to PCP to start phone visit.    Patient needs note to return to work.

## 2019-06-03 ENCOUNTER — Ambulatory Visit (INDEPENDENT_AMBULATORY_CARE_PROVIDER_SITE_OTHER): Payer: Medicare Other

## 2019-06-03 ENCOUNTER — Other Ambulatory Visit: Payer: Self-pay

## 2019-06-03 DIAGNOSIS — Z5181 Encounter for therapeutic drug level monitoring: Secondary | ICD-10-CM

## 2019-06-03 DIAGNOSIS — Z8673 Personal history of transient ischemic attack (TIA), and cerebral infarction without residual deficits: Secondary | ICD-10-CM

## 2019-06-03 DIAGNOSIS — I639 Cerebral infarction, unspecified: Secondary | ICD-10-CM | POA: Diagnosis not present

## 2019-06-03 LAB — POCT INR: INR: 2.1 (ref 2.0–3.0)

## 2019-06-03 NOTE — Patient Instructions (Signed)
Please take extra tablet tonight, then continue dosage of 1 pill every day except 1/2 tablets on Mondays & Fridays.  Recheck in 6 weeks.

## 2019-06-04 ENCOUNTER — Encounter: Payer: Self-pay | Admitting: Family Medicine

## 2019-06-04 ENCOUNTER — Other Ambulatory Visit: Payer: Self-pay

## 2019-06-04 ENCOUNTER — Ambulatory Visit (INDEPENDENT_AMBULATORY_CARE_PROVIDER_SITE_OTHER): Payer: Medicare Other | Admitting: Family Medicine

## 2019-06-04 DIAGNOSIS — R87610 Atypical squamous cells of undetermined significance on cytologic smear of cervix (ASC-US): Secondary | ICD-10-CM | POA: Diagnosis not present

## 2019-06-04 NOTE — Progress Notes (Signed)
   Subjective:    Patient ID: Mary [name] is a 53 y.o. female presenting with Gynecologic Exam  on 06/04/2019  HPI: Referred for abnormal pap smear with PCP in October. Pap smear reveals ASCUS with negative HPV. Here for further eval and assessment. No GYN issues. Cycles are coming q 3 months. Has complicated medical history. No h/o abnl paps.  Review of Systems  Constitutional: Negative for chills and fever.  Respiratory: Negative for shortness of breath.   Cardiovascular: Negative for chest pain.  Gastrointestinal: Negative for abdominal pain, nausea and vomiting.  Genitourinary: Negative for dysuria.  Skin: Negative for rash.      Objective:    BP 116/74   Pulse 69   Ht 5\' 5"  (1.651 m)   Wt 230 lb (104.3 kg)   LMP 04/04/2019 (Approximate)   BMI 38.27 kg/m  Physical Exam Constitutional:      General: She is not in acute distress.    Appearance: She is well-developed.  HENT:     Head: Normocephalic and atraumatic.  Eyes:     General: No scleral icterus. Neck:     Musculoskeletal: Neck supple.  Cardiovascular:     Rate and Rhythm: Normal rate.  Pulmonary:     Effort: Pulmonary effort is normal.  Abdominal:     Palpations: Abdomen is soft.  Skin:    General: Skin is warm and dry.  Neurological:     Mental Status: She is alert and oriented to person, place, and time.         Adequacy Satisfactory for evaluation endocervical/transformation zone component PRESENT.Abnormal    Diagnosis ATYPICAL GLANDULAR CELLS, SEE COMMENT.Abnormal    HPV NOT DETECTED   Comment: Normal Reference Range - NOT Detected  Material Submitted CervicoVaginal Pap [ThinPrep Imaged]Abnormal      Assessment & Plan:   Problem List Items Addressed This Visit      Unprioritized   ASCUS of cervix with negative high risk HPV    Per ASCCP guidelines with negative HPV, ASCUS pap needs f/u cytology with HPV screening in 3 years.         Total face-to-face time with patient: 20  minutes. Over 50% of encounter was spent on counseling and coordination of care. Return if symptoms worsen or fail to improve.  Donnamae Jude 06/04/2019 1:05 PM

## 2019-06-04 NOTE — Progress Notes (Signed)
Had an abnormal pap 09/25/2018, was sent over by PCP Mammo 11/2018

## 2019-06-04 NOTE — Assessment & Plan Note (Signed)
Per ASCCP guidelines with negative HPV, ASCUS pap needs f/u cytology with HPV screening in 3 years.

## 2019-06-10 ENCOUNTER — Encounter: Payer: Self-pay | Admitting: Podiatry

## 2019-06-10 ENCOUNTER — Other Ambulatory Visit: Payer: Self-pay

## 2019-06-10 ENCOUNTER — Ambulatory Visit (INDEPENDENT_AMBULATORY_CARE_PROVIDER_SITE_OTHER): Payer: Medicare Other | Admitting: Podiatry

## 2019-06-10 DIAGNOSIS — E119 Type 2 diabetes mellitus without complications: Secondary | ICD-10-CM | POA: Diagnosis not present

## 2019-06-10 DIAGNOSIS — M216X2 Other acquired deformities of left foot: Secondary | ICD-10-CM | POA: Diagnosis not present

## 2019-06-10 DIAGNOSIS — Q828 Other specified congenital malformations of skin: Secondary | ICD-10-CM | POA: Diagnosis not present

## 2019-06-10 NOTE — Progress Notes (Signed)
This patient presents to the office with chief complaint of painful  callus which has returned under the outside ball of left foot.  She has developed a callus under her left forefoot quickly after the callus was trimmed previously.  She presents to the office for continued evaluation of the callus left forefoot.  General Appearance  Alert, conversant and in no acute stress.  Vascular  Dorsalis pedis and posterior tibial  pulses are weakly palpable  bilaterally.  Capillary return is within normal limits  bilaterally. Temperature is within normal limits  bilaterally.  Neurologic  Senn-Weinstein monofilament wire test within normal limits  bilaterally. Muscle power within normal limits bilaterally.  Nails Normal nails noted with no evidence of bacterial or fungal infection.  Orthopedic  N0 limitation of motion feet .  No crepitus or effusions noted.  No bony pathology or digital deformities noted. Plantar flex fifth metatarsal left foot.  Skin  normotropic skin with no porokeratosis noted bilaterally.  No signs of infections or ulcers noted.   Porokeratosis sub 5th met left foot.  Porokeratosis sub 5th met left foot.  Diabetes with no foot complications  ROV.  Debride porokeratosis sub 5th left foot.  Discussed definitive treatment and she request a surgical evaluation.  Patient will be evaluated by Dr.  Evans.  RTC prn..   Gregory Mayer DPM  

## 2019-06-14 ENCOUNTER — Ambulatory Visit: Payer: Medicare Other | Admitting: Podiatry

## 2019-07-11 ENCOUNTER — Other Ambulatory Visit: Payer: Self-pay | Admitting: Internal Medicine

## 2019-07-12 ENCOUNTER — Telehealth: Payer: Self-pay | Admitting: Cardiovascular Disease

## 2019-07-12 NOTE — Telephone Encounter (Signed)

## 2019-07-15 ENCOUNTER — Ambulatory Visit (INDEPENDENT_AMBULATORY_CARE_PROVIDER_SITE_OTHER): Payer: Medicare Other

## 2019-07-15 ENCOUNTER — Other Ambulatory Visit: Payer: Self-pay

## 2019-07-15 DIAGNOSIS — Z5181 Encounter for therapeutic drug level monitoring: Secondary | ICD-10-CM

## 2019-07-15 DIAGNOSIS — Z8673 Personal history of transient ischemic attack (TIA), and cerebral infarction without residual deficits: Secondary | ICD-10-CM | POA: Diagnosis not present

## 2019-07-15 DIAGNOSIS — I639 Cerebral infarction, unspecified: Secondary | ICD-10-CM | POA: Diagnosis not present

## 2019-07-15 LAB — POCT INR: INR: 3.5 — AB (ref 2.0–3.0)

## 2019-07-15 NOTE — Patient Instructions (Signed)
Please continue dosage of 1 pill every day except 1/2 tablets on Mondays & Fridays.  Recheck in 5 weeks.

## 2019-07-16 ENCOUNTER — Ambulatory Visit: Payer: Medicare Other | Attending: Family Medicine | Admitting: Family Medicine

## 2019-07-16 DIAGNOSIS — J029 Acute pharyngitis, unspecified: Secondary | ICD-10-CM

## 2019-07-16 MED ORDER — OMEPRAZOLE 20 MG PO CPDR: 20.0000 mg | DELAYED_RELEASE_CAPSULE | Freq: Every day | ORAL | 3 refills | Status: DC

## 2019-07-16 MED ORDER — LORATADINE 10 MG PO TABS: 10.0000 mg | ORAL_TABLET | Freq: Every day | ORAL | 1 refills | Status: DC

## 2019-07-16 NOTE — Progress Notes (Signed)
Virtual Visit via Telephone Note  I connected with Mary [name], on 07/16/2019 at 2:56 PM by telephone due to the COVID-19 pandemic and verified that I am speaking with the correct person using two identifiers.   Consent: I discussed the limitations, risks, security and privacy concerns of performing an evaluation and management service by telephone and the availability of in person appointments. I also discussed with the patient that there may be a patient responsible charge related to this service. The patient expressed understanding and agreed to proceed.   Location of Patient: Home  Location of Provider: Clinic   Persons participating in Telemedicine visit: Mirielle Byrum Farrington-CMA Dr. Felecia Shelling     History of Present Illness: Mary [name] is a 53 year old female with history of type 2 diabetes mellitus (diet controlled with A1c of 5.7) antiphospholipid syndrome (on chronic anticoagulation with Coumadin, followed by the Coumadin clinic), previous stroke with no residual deficits, hypertension who presents today requesting a note to return to work. She has had sore throat for the last 3 weeks and has been out of work since 06/24/2019.  She denies nasal congestion, fever, myalgias, history of sick contacts and she is on Flonase and Zyrtec.  Denies dyspnea or chest pain. She describes today as a good day with regards to her sore throat.  She was seen by GYN on 05/2019 (Dr Darron Doom) for atypical glandular cells, HPV negative from Pap smear from 09/2018 with the aim of her undergoing colposcopy however no additional work-up was done and report was read by GYN as ASCUS, negative HPV, repeat Pap smear in 3 years.   Past Medical History:  Diagnosis Date  . Blood transfusion without reported diagnosis    transfusion December 2016  . Clotting disorder (HCC)    left leg  . Diabetes mellitus without complication ([place])   . Hypertension   . Other and unspecified ovarian  cysts   . Stroke ([place])   . Vaginal Pap smear, abnormal    No Known Allergies  Current Outpatient Medications on File Prior to Visit  Medication Sig Dispense Refill  . amLODipine (NORVASC) 10 MG tablet Take 1 tablet (10 mg total) by mouth daily. 30 tablet 6  . docusate sodium (COLACE) 100 MG capsule Take 100 mg by mouth daily as needed for mild constipation.    . fluticasone (FLONASE) 50 MCG/ACT nasal spray Place 2 sprays into both nostrils daily. 16 g 6  . hydrochlorothiazide (HYDRODIURIL) 25 MG tablet Take 1 tablet (25 mg total) by mouth daily. 30 tablet 6  . loratadine (CLARITIN) 10 MG tablet Take 1 tablet (10 mg total) by mouth daily. To help with sneezing/nasal congestion 30 tablet 11  . metoprolol tartrate (LOPRESSOR) 25 MG tablet TAKE 25 MG BY MOUTH TWICE DAILY 60 tablet 6  . warfarin (COUMADIN) 5 MG tablet TAKE AS DIRECTED BY  COUMADIN  CLINIC 100 tablet 0  . atorvastatin (LIPITOR) 20 MG tablet Take 1 tablet (20 mg total) by mouth daily. (Patient not taking: Reported on 04/05/2019) 30 tablet 6  . potassium chloride SA (K-DUR,KLOR-CON) 20 MEQ tablet Take 1 tablet (20 mEq total) by mouth daily. (Patient not taking: Reported on 01/29/2019) 30 tablet 1  . Vitamin D, Ergocalciferol, (DRISDOL) 50000 units CAPS capsule Take 1 capsule (50,000 Units total) by mouth every 7 (seven) days. (Patient not taking: Reported on 07/16/2019) 12 capsule 0   No current facility-administered medications on file prior to visit.     Observations/Objective:  AAOx3 Not in acute distress  Assessment and Plan: 1. Sorethroat Likely sinus origin Placed on PPI in the event that there is a GI component Will provide note to return to work once COVID-19 test is negative - Novel Coronavirus, NAA ([facility]); Future - omeprazole (PRILOSEC) 20 MG capsule; Take 1 capsule (20 mg total) by mouth daily.  Dispense: 30 capsule; Refill: 3 - loratadine (CLARITIN) 10 MG tablet; Take 1 tablet (10 mg total) by mouth daily. To help  with sneezing/nasal congestion  Dispense: 30 tablet; Refill: 1  Advised that in the event of atypical glandular cells she does need a colposcopy.  I will repeat her Pap smear again in 09/2019 and refer her back again for colposcopy if still abnormal.  Follow Up Instructions: Return in about 3 months (around 10/16/2019) for medical conditions.    I discussed the assessment and treatment plan with the patient. The patient was provided an opportunity to ask questions and all were answered. The patient agreed with the plan and demonstrated an understanding of the instructions.   The patient was advised to call back or seek an in-person evaluation if the symptoms worsen or if the condition fails to improve as anticipated.     I provided 15 minutes total of non-face-to-face time during this encounter including median intraservice time, reviewing previous notes, labs, imaging, medications, management and patient verbalized understanding.     [name], MD, FAAFP. [facility] [place], [place]   07/16/2019, 2:56 PM

## 2019-07-16 NOTE — Progress Notes (Signed)
Patient needs letter to return back to work.  Patient was having a sore throat and employer needs letter before returning to work.

## 2019-07-17 ENCOUNTER — Other Ambulatory Visit: Payer: Self-pay

## 2019-07-17 ENCOUNTER — Ambulatory Visit: Payer: Medicare Other | Attending: Family Medicine | Admitting: *Deleted

## 2019-07-17 DIAGNOSIS — J029 Acute pharyngitis, unspecified: Secondary | ICD-10-CM | POA: Diagnosis not present

## 2019-07-17 NOTE — Patient Instructions (Signed)
Patient received post screening instructions.

## 2019-07-17 NOTE — Progress Notes (Signed)
Patient tolerated the swab screening well and was provided the instructions and post instructions prior to leaving. Patient presented with cloth mask over the nose and under the chin. Patient exemplified no symptoms while in the room.

## 2019-07-19 ENCOUNTER — Telehealth: Payer: Self-pay | Admitting: Family Medicine

## 2019-07-19 LAB — NOVEL CORONAVIRUS, NAA: SARS-CoV-2, NAA: NOT DETECTED

## 2019-07-19 NOTE — Telephone Encounter (Signed)
Pt would like a letter to return back to work because she was negative for COVID-19 on 07/17/2019, please follow up

## 2019-07-20 NOTE — Telephone Encounter (Signed)
Letter was done yesterday in [place].

## 2019-08-21 ENCOUNTER — Ambulatory Visit (INDEPENDENT_AMBULATORY_CARE_PROVIDER_SITE_OTHER): Payer: Medicare Other

## 2019-08-21 ENCOUNTER — Other Ambulatory Visit: Payer: Self-pay

## 2019-08-21 DIAGNOSIS — Z8673 Personal history of transient ischemic attack (TIA), and cerebral infarction without residual deficits: Secondary | ICD-10-CM | POA: Diagnosis not present

## 2019-08-21 DIAGNOSIS — I639 Cerebral infarction, unspecified: Secondary | ICD-10-CM

## 2019-08-21 DIAGNOSIS — Z5181 Encounter for therapeutic drug level monitoring: Secondary | ICD-10-CM

## 2019-08-21 LAB — POCT INR: INR: 2.4 (ref 2.0–3.0)

## 2019-08-21 NOTE — Patient Instructions (Signed)
Please take 1.5 tablets tonight, then continue dosage of 1 pill every day except 1/2 tablets on Mondays & Fridays.  Recheck in 5 weeks.

## 2019-08-28 NOTE — Progress Notes (Signed)
Cardiology Office Note    Date:  09/02/2019   ID:  Mary [name], DOB 1966/11/29, MRN [id number]  PCP:  [name], MD  Cardiologist:  Ida Rogue, MD  Electrophysiologist:  Virl Axe, MD   Chief Complaint: Chest discomfort  History of Present Illness:   Mary [name] is a 53 y.o. female with history of cryptogenic stroke with residual ataxia status post ILR implantation in 2015 status post explantation in 2019, antiphospholipid syndrome on Coumadin, morbid obesity status post bariatric surgery, diabetes, hypertension, microcytic anemia, and sleep apnea not on CPAP who presents for evaluation of chest discomfort.  Patient was admitted to the hospital in 2014 with acute CVA.  Surface echo in 01/2013 showed an EF of 60 to 65%, moderate LVH, no diagnostic regional wall motion abnormality, grade 1 diastolic dysfunction, normal RV cavity size with normal RV systolic function, no significant valvular abnormalities with the study being overall technically difficult.  TEE during that admission showed an EF of 60 to 65% with no echocardiographic evidence of thrombus.  Patient subsequently underwent loop recorder implantation in 2015 with several episodes of 3+ second pauses noted over the years.  Echo in 2017 showed an EF of 60 to 65%, no regional wall motion normalities, normal LV diastolic function, normal size left atrium, normal RV systolic function, normal PASP, no significant valvular abnormalities.  Myoview at that time showed a small defect of mild severity present in the apex location felt to be secondary to breast attenuation, EF 55 to 65%, overall low risk study.  Patient was initially hesitant at device explantation though subsequently underwent this in 2019.  She comes in doing reasonably well.  She continues to have residual dizziness following her prior stroke though this has improved over the years.  She denies any new strokelike symptoms.  Her main concern today is over the  past week she has noted intermittent chest discomfort that occurs randomly and will last for approximately 5 minutes followed by spontaneous resolution.  There are no associated symptoms.  She last had this discomfort last evening while watching TV laying in bed.  She denies any family history of premature CAD.  She has been off Lipitor and HCTZ for unclear reasons and for an unknown amount of time.  She does not regularly check her blood pressure at home.  She denies any lower extremity swelling, abdominal distention, orthopnea, PND, early satiety.  No falls, BRBPR, or melena.  Currently chest pain-free.   Labs: 08/21/2019 - INR 2.4 02/2019 - A1c 6.1, serum creatinine 0.95, potassium 3.7 04/2018 - total cholesterol 152, triglyceride 195, HDL 43, LDL 70, Hgb 11.1, PLT 294, AST/ALT normal, albumin 4.0   Past Medical History:  Diagnosis Date  . Blood transfusion without reported diagnosis    transfusion December 2016  . Clotting disorder (HCC)    left leg  . Diabetes mellitus without complication ([place])   . Hypertension   . Other and unspecified ovarian cysts   . Stroke ([place])   . Vaginal Pap smear, abnormal     Past Surgical History:  Procedure Laterality Date  . COLONOSCOPY WITH PROPOFOL N/A 11/10/2015   Procedure: COLONOSCOPY WITH PROPOFOL;  Surgeon: Lucilla Lame, MD;  Location: [facility] ENDOSCOPY;  Service: Endoscopy;  Laterality: N/A;  . ESOPHAGOGASTRODUODENOSCOPY (EGD) WITH PROPOFOL N/A 11/10/2015   Procedure: ESOPHAGOGASTRODUODENOSCOPY (EGD) WITH PROPOFOL;  Surgeon: Lucilla Lame, MD;  Location: [facility] ENDOSCOPY;  Service: Endoscopy;  Laterality: N/A;  . LAPAROSCOPIC GASTRIC SLEEVE RESECTION  02/17/2016  placement  . LOOP RECORDER IMPLANT N/A 04/25/2014   Procedure: LOOP RECORDER IMPLANT;  Surgeon: Coralyn Mark, MD;  Location: [facility] CATH LAB;  Service: Cardiovascular;  Laterality: N/A;  . LOOP RECORDER REMOVAL N/A 02/01/2018   Procedure: LOOP RECORDER REMOVAL;  Surgeon: Deboraha Sprang, MD;   Location: [facility] CV LAB;  Service: Cardiovascular;  Laterality: N/A;  . OVARIAN CYST REMOVAL    . TEE WITHOUT CARDIOVERSION N/A 01/25/2013   Procedure: TRANSESOPHAGEAL ECHOCARDIOGRAM (TEE);  Surgeon: Birdie Riddle, MD;  Location: [facility] ENDOSCOPY;  Service: Cardiovascular;  Laterality: N/A;    Current Medications: Current Meds  Medication Sig  . amLODipine (NORVASC) 10 MG tablet Take 1 tablet (10 mg total) by mouth daily.  Marland Kitchen docusate sodium (COLACE) 100 MG capsule Take 100 mg by mouth daily as needed for mild constipation.  . fluticasone (FLONASE) 50 MCG/ACT nasal spray Place 2 sprays into both nostrils daily.  Marland Kitchen loratadine (CLARITIN) 10 MG tablet Take 1 tablet (10 mg total) by mouth daily. To help with sneezing/nasal congestion  . metoprolol tartrate (LOPRESSOR) 25 MG tablet TAKE 25 MG BY MOUTH TWICE DAILY  . warfarin (COUMADIN) 5 MG tablet TAKE AS DIRECTED BY  COUMADIN  CLINIC    Allergies:   Patient has no known allergies.   Social History   Socioeconomic History  . Marital status: Single    Spouse name: Not on file  . Number of children: 2  . Years of education: 26  . Highest education level: Not on file  Occupational History  . Not on file  Social Needs  . Financial resource strain: Not on file  . Food insecurity    Worry: Not on file    Inability: Not on file  . Transportation needs    Medical: Not on file    Non-medical: Not on file  Tobacco Use  . Smoking status: Former Smoker    Packs/day: 1.00    Years: 10.00    Pack years: 10.00    Types: Cigarettes    Quit date: 07/14/2006    Years since quitting: 13.1  . Smokeless tobacco: Never Used  . Tobacco comment: quit smoking around 2007  Substance and Sexual Activity  . Alcohol use: No    Alcohol/week: 0.0 standard drinks  . Drug use: No  . Sexual activity: Yes  Lifestyle  . Physical activity    Days per week: Not on file    Minutes per session: Not on file  . Stress: Not on file  Relationships  . Social  Herbalist on phone: Not on file    Gets together: Not on file    Attends religious service: Not on file    Active member of club or organization: Not on file    Attends meetings of clubs or organizations: Not on file    Relationship status: Not on file  Other Topics Concern  . Not on file  Social History Narrative   Patient is single with two children.   Patient is right handed.   Patient has 13 yrs of education.   Patient drinks 5 sodas daily.     Family History:  The patient's family history includes Diabetes in her mother, sister, and another family member; Diabetes Mellitus II in her mother; Stroke in her father, mother, and another family member; Transient ischemic attack in her father.  ROS:   Review of Systems  Constitutional: Positive for malaise/fatigue. Negative for chills, diaphoresis, fever and weight loss.  HENT: Negative for congestion.   Eyes: Negative for discharge and redness.  Respiratory: Negative for cough, hemoptysis, sputum production, shortness of breath and wheezing.   Cardiovascular: Positive for chest pain. Negative for palpitations, orthopnea, claudication, leg swelling and PND.  Gastrointestinal: Negative for abdominal pain, blood in stool, heartburn, melena, nausea and vomiting.  Genitourinary: Negative for hematuria.  Musculoskeletal: Negative for falls and myalgias.  Skin: Negative for rash.  Neurological: Positive for dizziness. Negative for tingling, tremors, sensory change, speech change, focal weakness, loss of consciousness and weakness.  Endo/Heme/Allergies: Does not bruise/bleed easily.  Psychiatric/Behavioral: Negative for substance abuse. The patient is not nervous/anxious.   All other systems reviewed and are negative.    EKGs/Labs/Other Studies Reviewed:    Studies reviewed were summarized above. The additional studies were reviewed today: As outlined above  EKG:  EKG is ordered today.  The EKG ordered today demonstrates  sinus bradycardia with sinus arrhythmia, 54 bpm, low voltage QRS, poor R wave progression in the precordial leads, no acute ST-T changes  Recent Labs: 01/29/2019: BUN 15; Creatinine, Ser 0.95; Potassium 3.7; Sodium 143  Recent Lipid Panel    Component Value Date/Time   CHOL 152 04/26/2018 1454   TRIG 195 (H) 04/26/2018 1454   HDL 43 04/26/2018 1454   CHOLHDL 3.5 04/26/2018 1454   CHOLHDL 3.6 07/22/2015 0944   VLDL 27 07/22/2015 0944   LDLCALC 70 04/26/2018 1454    PHYSICAL EXAM:    VS:  BP 136/80 (BP Location: Left Arm, Patient Position: Sitting, Cuff Size: Normal)   Temp (!) 97.5 F (36.4 C)   Ht 5\' 5"  (1.651 m)   Wt 231 lb 12 oz (105.1 kg)   SpO2 97%   BMI 38.57 kg/m   BMI: Body mass index is 38.57 kg/m.  Physical Exam  Constitutional: She is oriented to person, place, and time. She appears well-developed and well-nourished.  HENT:  Head: Normocephalic and atraumatic.  Eyes: Right eye exhibits no discharge. Left eye exhibits no discharge.  Neck: Normal range of motion. No JVD present.  Cardiovascular: Regular rhythm, S1 normal, S2 normal and normal heart sounds. Bradycardia present. Exam reveals no distant heart sounds, no friction rub, no midsystolic click and no opening snap.  No murmur heard. Pulses:      Posterior tibial pulses are 2+ on the right side and 2+ on the left side.  Pulmonary/Chest: Effort normal and breath sounds normal. No respiratory distress. She has no decreased breath sounds. She has no wheezes. She has no rales. She exhibits no tenderness.  Abdominal: Soft. She exhibits no distension. There is no abdominal tenderness.  Musculoskeletal:        General: No edema.  Neurological: She is alert and oriented to person, place, and time.  Skin: Skin is warm and dry. No cyanosis. Nails show no clubbing.  Psychiatric: She has a normal mood and affect. Her speech is normal and behavior is normal. Judgment and thought content normal.    Wt Readings from Last  3 Encounters:  09/02/19 231 lb 12 oz (105.1 kg)  06/04/19 230 lb (104.3 kg)  01/29/19 227 lb 9.6 oz (103.2 kg)     ASSESSMENT & PLAN:   1. Chest pain with moderate as her cardiac etiology: Currently chest pain-free.  Over the past week she has noted intermittent, randomly occurring chest pain that will last for approximately 5 minutes with spontaneous resolution, and is without associated symptoms.  Prior [employer] low risk as outlined above.  Discussed  invasive and noninvasive ischemic evaluation.  We have decided to proceed with a coronary CTA.  Obtain lipid panel as outlined below with likely plan to restart atorvastatin following updated labs over the phone in an effort to further risk stratify her.  On Coumadin in place of aspirin.  2. Cryptogenic stroke: Patient with residual ataxia.  No new strokelike symptoms.  Continue aggressive risk factor modification.  3. Antiphospholipid syndrome: Remains on warfarin which is monitored by our Coumadin clinic with most recent INR 2.4.  No symptoms concerning for bleeding.  Follow-up as directed.  4. Hypertension: Blood pressure is mildly elevated today.  She has been off HCTZ for unknown amount of time for unclear reasons.  Restart HCTZ 12.5 mg daily today with a follow-up BMP in 1 week.  Continue Norvasc and Lopressor.  5. Hyperlipidemia: Most recent LDL of 70 from 04/2018.  She has been off atorvastatin for unknown amount of time for unclear reasons.  Check lipid panel and CMP today.  Plan to restart atorvastatin over the phone when labs are back.  6. Obesity: Status post bariatric surgery.  Continue weight loss is recommended.  7. Sleep apnea: Not compliant with CPAP.  Disposition: F/u with Dr. Rockey Situ or an APP in 3 months and EP as directed.   Medication Adjustments/Labs and Tests Ordered: Current medicines are reviewed at length with the patient today.  Concerns regarding medicines are outlined above. Medication changes, Labs and  Tests ordered today are summarized above and listed in the Patient Instructions accessible in Encounters.   Signed, Christell Faith, PA-C 09/02/2019 8:37 AM     [facility] [address] [place] [address] [place], [place] [address] [phone]

## 2019-09-02 ENCOUNTER — Telehealth: Payer: Self-pay | Admitting: Physician Assistant

## 2019-09-02 ENCOUNTER — Other Ambulatory Visit: Payer: Self-pay

## 2019-09-02 ENCOUNTER — Ambulatory Visit (INDEPENDENT_AMBULATORY_CARE_PROVIDER_SITE_OTHER): Payer: Medicare Other | Admitting: Physician Assistant

## 2019-09-02 ENCOUNTER — Other Ambulatory Visit
Admission: RE | Admit: 2019-09-02 | Discharge: 2019-09-02 | Disposition: A | Discharge status: Home / Self Care | Payer: Medicare Other | Source: Ambulatory Visit | Attending: Physician Assistant | Admitting: Physician Assistant

## 2019-09-02 ENCOUNTER — Encounter: Payer: Self-pay | Admitting: Physician Assistant

## 2019-09-02 VITALS — BP 136/80 | HR 54 | Temp 97.5°F | Ht 65.0 in | Wt 231.8 lb

## 2019-09-02 DIAGNOSIS — E785 Hyperlipidemia, unspecified: Secondary | ICD-10-CM | POA: Insufficient documentation

## 2019-09-02 DIAGNOSIS — I639 Cerebral infarction, unspecified: Secondary | ICD-10-CM | POA: Diagnosis not present

## 2019-09-02 DIAGNOSIS — I10 Essential (primary) hypertension: Secondary | ICD-10-CM

## 2019-09-02 DIAGNOSIS — G4730 Sleep apnea, unspecified: Secondary | ICD-10-CM

## 2019-09-02 DIAGNOSIS — D6861 Antiphospholipid syndrome: Secondary | ICD-10-CM | POA: Diagnosis not present

## 2019-09-02 DIAGNOSIS — R079 Chest pain, unspecified: Secondary | ICD-10-CM | POA: Diagnosis not present

## 2019-09-02 DIAGNOSIS — Z79899 Other long term (current) drug therapy: Secondary | ICD-10-CM

## 2019-09-02 LAB — COMPREHENSIVE METABOLIC PANEL
ALT: 18 U/L (ref 0–44)
AST: 22 U/L (ref 15–41)
Albumin: 3.7 g/dL (ref 3.5–5.0)
Alkaline Phosphatase: 57 U/L (ref 38–126)
Anion gap: 7 (ref 5–15)
BUN: 19 mg/dL (ref 6–20)
CO2: 28 mmol/L (ref 22–32)
Calcium: 8.7 mg/dL — ABNORMAL LOW (ref 8.9–10.3)
Chloride: 105 mmol/L (ref 98–111)
Creatinine, Ser: 1.08 mg/dL — ABNORMAL HIGH (ref 0.44–1.00)
GFR calc Af Amer: >60 mL/min (ref >60)
GFR calc non Af Amer: 59 mL/min — ABNORMAL LOW (ref >60)
Glucose, Bld: 91 mg/dL (ref 70–99)
Potassium: 3.6 mmol/L (ref 3.5–5.1)
Sodium: 140 mmol/L (ref 135–145)
Total Bilirubin: 0.5 mg/dL (ref 0.3–1.2)
Total Protein: 7.8 g/dL (ref 6.5–8.1)

## 2019-09-02 LAB — LIPID PANEL
Cholesterol: 164 mg/dL (ref 0–200)
HDL: 54 mg/dL (ref >40)
LDL Cholesterol: 90 mg/dL (ref 0–99)
Total CHOL/HDL Ratio: 3 RATIO
Triglycerides: 101 mg/dL (ref <150)
VLDL: 20 mg/dL (ref 0–40)

## 2019-09-02 MED ORDER — ATORVASTATIN CALCIUM 40 MG PO TABS: 40.0000 mg | ORAL_TABLET | Freq: Every day | ORAL | 6 refills | Status: DC

## 2019-09-02 MED ORDER — POTASSIUM CHLORIDE ER 10 MEQ PO TBCR: 10.0000 meq | EXTENDED_RELEASE_TABLET | Freq: Every day | ORAL | 6 refills | Status: DC

## 2019-09-02 MED ORDER — HYDROCHLOROTHIAZIDE 25 MG PO TABS: 12.5000 mg | ORAL_TABLET | Freq: Every day | ORAL | 11 refills | Status: DC

## 2019-09-02 NOTE — Telephone Encounter (Signed)
Notes recorded by Rise Mu, PA-C on 09/02/2019 at 11:03 AM EDT  Potassium normal though slightly below goal  Renal function mildly elevated though consistent with prior readings over the past year  Liver function normal  LDL, "bad cholesterol" slightly higher than prior reading off Lipitor   Recommendations:  Restart atorvastatin 40 mg daily  Recheck fasting lipid panel and liver function in 8 weeks  With slightly lower potassium recommend she also start KCl 10 mEq daily with follow-up BMP as outlined below  Given recently restarted HCTZ at her visit, continue with planned follow-up BMP in 1 week to ensure stable renal function and potassium

## 2019-09-02 NOTE — Telephone Encounter (Signed)
I spoke with the patient regarding his lab results and Christell Faith, PA's recommendations to:  1) Restart atorvastatin 40 mg once daily 2) Repeat a FASTING lipid/ liver panel in 8 weeks 3) Start K+ 10 meq once daily 4) Repeat her BMP as planned in 1 week  The patient voices understanding of the above. [facility] for repeat FASTING labs.  RX's for atorvastatin & K+ updated at the pharmacy.

## 2019-09-02 NOTE — Patient Instructions (Signed)
Medication Instructions:  Your physician has recommended you make the following change in your medication:  1- START HCTZ Take 0.5 tablets (12.5 mg total) by mouth daily If you need a refill on your cardiac medications before your next appointment, please call your pharmacy.   Lab work: 1- Your physician recommends that you return for lab work today at [facility], Lipid w direct) No appt is needed. Hours are M-F 7AM- 6 PM.  2- Your physician recommends that you return for lab work in:1 week at the [facility] mall. ([facility]) No appt is needed. Hours are M-F 7AM- 6 PM.  If you have labs (blood work) drawn today and your tests are completely normal, you will receive your results only by: Marland Kitchen MyChart Message (if you have MyChart) OR . A paper copy in the mail If you have any lab test that is abnormal or we need to change your treatment, we will call you to review the results.  Testing/Procedures: 1- Coronary CT Your cardiac CT will be scheduled at one of the below locations:   [facility] [address] [place], [place] [address] [phone] [place] [address] [place], [place] [address] [phone]  If scheduled at [facility], please arrive at the [facility] main entrance of [facility] 30-45 minutes prior to test start time. Proceed to the [facility] Radiology Department (first floor) to check-in and test prep.  If scheduled at [facility], please arrive 15 mins early for check-in and test prep.  Please follow these instructions carefully (unless otherwise directed):  Hold all erectile dysfunction medications at least 5 days prior to test.  On the Night Before the Test: . Be sure to Drink plenty of water. . Do not consume any caffeinated/decaffeinated beverages or chocolate 12 hours prior to your test. . Do not take any antihistamines 12 hours prior to your  test.  On the Day of the Test: . Drink plenty of water. Do not drink any water within one hour of the test. . Do not eat any food 4 hours prior to the test. . You may take your regular medications prior to the test.  . Take metoprolol (Lopressor) two hours prior to test. . HOLD Hydrochlorothiazide morning of the test. . FEMALES- please wear underwire-free bra if available  After the Test: . Drink plenty of water. . After receiving IV contrast, you may experience a mild flushed feeling. This is normal. . On occasion, you may experience a mild rash up to 24 hours after the test. This is not dangerous. If this occurs, you can take Benadryl 25 mg and increase your fluid intake. . If you experience trouble breathing, this can be serious. If it is severe call 911 IMMEDIATELY. If it is mild, please call our office. . If you take any of these medications: Glipizide/Metformin, Avandament, Glucavance, please do not take 48 hours after completing test unless otherwise instructed.    Please contact the cardiac imaging nurse navigator should you have any questions/concerns Marchia Bond, RN Navigator Cardiac Imaging [facility] [phone] Office  [phone] Cell    Follow-Up: At [facility], you and your health needs are our priority.  As part of our continuing mission to provide you with exceptional heart care, we have created designated Provider Care Teams.  These Care Teams include your primary Cardiologist (physician) and Advanced Practice Providers (APPs -  Physician  Assistants and Nurse Practitioners) who all work together to provide you with the care you need, when you need it. You will need a follow up appointment in 3 months. You may see Ida Rogue, MD or Christell Faith, PA-C.

## 2019-09-17 ENCOUNTER — Other Ambulatory Visit
Admission: RE | Admit: 2019-09-17 | Discharge: 2019-09-17 | Disposition: A | Discharge status: Home / Self Care | Payer: Medicare Other | Source: Ambulatory Visit | Attending: Physician Assistant | Admitting: Physician Assistant

## 2019-09-17 DIAGNOSIS — Z79899 Other long term (current) drug therapy: Secondary | ICD-10-CM | POA: Diagnosis not present

## 2019-09-17 DIAGNOSIS — E785 Hyperlipidemia, unspecified: Secondary | ICD-10-CM | POA: Insufficient documentation

## 2019-09-17 LAB — LIPID PANEL
Cholesterol: 106 mg/dL (ref 0–200)
HDL: 50 mg/dL (ref >40)
LDL Cholesterol: 34 mg/dL (ref 0–99)
Total CHOL/HDL Ratio: 2.1 RATIO
Triglycerides: 112 mg/dL (ref <150)
VLDL: 22 mg/dL (ref 0–40)

## 2019-09-17 LAB — HEPATIC FUNCTION PANEL
ALT: 26 U/L (ref 0–44)
AST: 36 U/L (ref 15–41)
Albumin: 3.7 g/dL (ref 3.5–5.0)
Alkaline Phosphatase: 67 U/L (ref 38–126)
Bilirubin, Direct: <0.1 mg/dL (ref 0.0–0.2)
Total Bilirubin: 0.6 mg/dL (ref 0.3–1.2)
Total Protein: 7.8 g/dL (ref 6.5–8.1)

## 2019-09-17 LAB — BASIC METABOLIC PANEL
Anion gap: 8 (ref 5–15)
BUN: 14 mg/dL (ref 6–20)
CO2: 27 mmol/L (ref 22–32)
Calcium: 8.7 mg/dL — ABNORMAL LOW (ref 8.9–10.3)
Chloride: 105 mmol/L (ref 98–111)
Creatinine, Ser: 0.99 mg/dL (ref 0.44–1.00)
GFR calc Af Amer: >60 mL/min (ref >60)
GFR calc non Af Amer: >60 mL/min (ref >60)
Glucose, Bld: 79 mg/dL (ref 70–99)
Potassium: 3.2 mmol/L — ABNORMAL LOW (ref 3.5–5.1)
Sodium: 140 mmol/L (ref 135–145)

## 2019-09-20 ENCOUNTER — Telehealth: Payer: Self-pay

## 2019-09-20 DIAGNOSIS — E785 Hyperlipidemia, unspecified: Secondary | ICD-10-CM

## 2019-09-20 MED ORDER — POTASSIUM CHLORIDE CRYS ER 20 MEQ PO TBCR: 40.0000 meq | EXTENDED_RELEASE_TABLET | Freq: Every day | ORAL | 0 refills | Status: DC

## 2019-09-20 NOTE — Telephone Encounter (Signed)
The patient has been notified of the result and verbalized understanding.  All questions (if any) were answered. New RX sent for 40 mEq KCL and BMET order placed, f/u lab on 10/12 Wilma Flavin, RN 09/20/2019 9:46 AM

## 2019-09-25 ENCOUNTER — Other Ambulatory Visit: Payer: Self-pay

## 2019-09-25 ENCOUNTER — Telehealth: Payer: Self-pay

## 2019-09-25 ENCOUNTER — Ambulatory Visit (INDEPENDENT_AMBULATORY_CARE_PROVIDER_SITE_OTHER): Payer: Medicare Other

## 2019-09-25 DIAGNOSIS — I639 Cerebral infarction, unspecified: Secondary | ICD-10-CM

## 2019-09-25 DIAGNOSIS — Z8673 Personal history of transient ischemic attack (TIA), and cerebral infarction without residual deficits: Secondary | ICD-10-CM | POA: Diagnosis not present

## 2019-09-25 DIAGNOSIS — Z5181 Encounter for therapeutic drug level monitoring: Secondary | ICD-10-CM | POA: Diagnosis not present

## 2019-09-25 LAB — POCT INR: INR: 3.8 — AB (ref 2.0–3.0)

## 2019-09-25 NOTE — Patient Instructions (Signed)
Please skip warfarin tonight, then continue dosage of 1 pill every day except 1/2 tablets on Mondays & Fridays.  Recheck in 6 weeks.

## 2019-09-25 NOTE — Telephone Encounter (Signed)
Incoming staff message received from schediling. Pt called back and scheduled imaging for later this month.

## 2019-09-25 NOTE — Telephone Encounter (Signed)
Attempted call to patient to further discuss CT order. Per CT scheduler, pt is refusing imaging at this time. Wants to wait until she speaks with Dr. Rockey Situ at next [place].   I attempted call to assess chest pain since last OV, NA/NV.

## 2019-09-30 ENCOUNTER — Other Ambulatory Visit: Payer: Medicare Other

## 2019-09-30 ENCOUNTER — Telehealth (HOSPITAL_COMMUNITY): Payer: Self-pay | Admitting: Emergency Medicine

## 2019-09-30 NOTE — Telephone Encounter (Signed)
mychart message sent

## 2019-09-30 NOTE — Telephone Encounter (Signed)
Pt unable to take notes while driving, pt verbalized that he has access to mychart account, will send instructions for CTA to mychart account.  Pt verbalized understanding to call back with any questions Marchia Bond RN Navigator Cardiac [facility] [phone] Office  [phone] Cell

## 2019-10-02 ENCOUNTER — Other Ambulatory Visit: Payer: Self-pay

## 2019-10-02 ENCOUNTER — Telehealth: Payer: Self-pay

## 2019-10-02 ENCOUNTER — Ambulatory Visit
Admission: RE | Admit: 2019-10-02 | Discharge: 2019-10-02 | Disposition: A | Discharge status: Home / Self Care | Payer: Medicare Other | Source: Ambulatory Visit | Attending: Physician Assistant | Admitting: Physician Assistant

## 2019-10-02 DIAGNOSIS — Z87891 Personal history of nicotine dependence: Secondary | ICD-10-CM | POA: Diagnosis not present

## 2019-10-02 DIAGNOSIS — R072 Precordial pain: Secondary | ICD-10-CM | POA: Insufficient documentation

## 2019-10-02 DIAGNOSIS — E785 Hyperlipidemia, unspecified: Secondary | ICD-10-CM | POA: Diagnosis not present

## 2019-10-02 DIAGNOSIS — R0600 Dyspnea, unspecified: Secondary | ICD-10-CM

## 2019-10-02 IMAGING — CT CT HEART MORP W/ CTA COR W/ SCORE W/ CA W/CM &/OR W/O CM
1 of 7 series · 12 of 20 positions shown, 15 images · non-contrast
Comparison: None.

Addendum:
CLINICAL DATA: 52-year-old female with h/o DM and hypertension

EXAM:
Cardiac/Coronary  CTA
TECHNIQUE: The patient was scanned on a [employer] Force scanner.

[Series 17: multiphase % cta coronary · axial · 0.41mm/px · z∈[-1422,-1321]mm · 12 of 5117 slices shown, 15 images]
[im 394/5117  vessel]
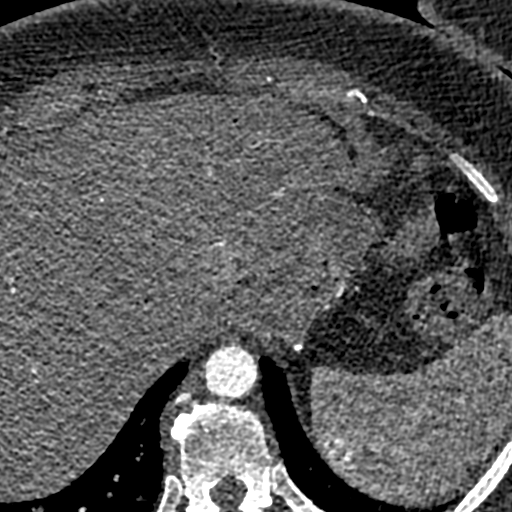
[im 394/5117  lung]
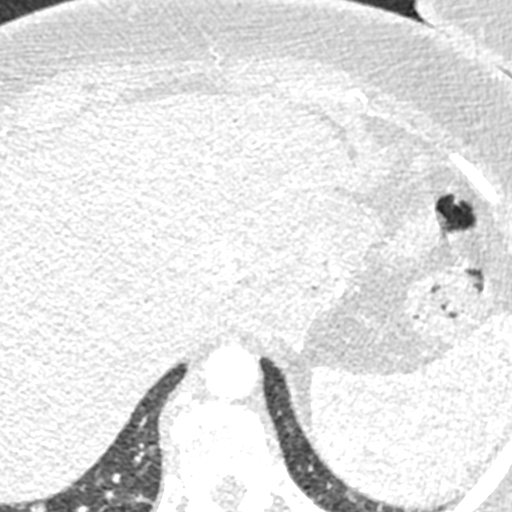
[im 788/5117  vessel]
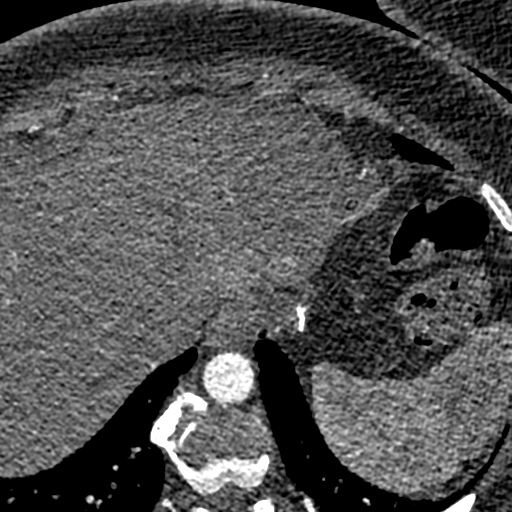
[im 1181/5117  vessel]
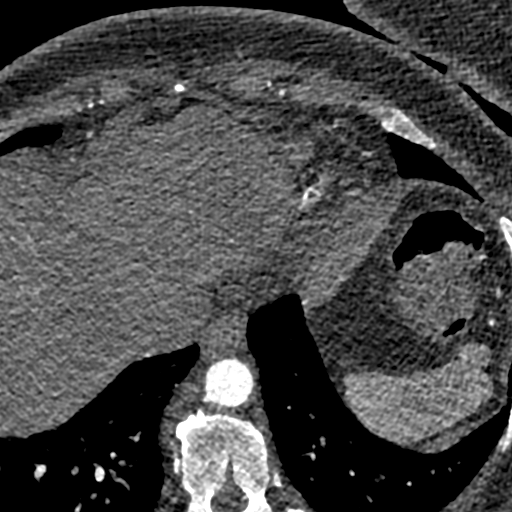
[im 1575/5117  vessel]
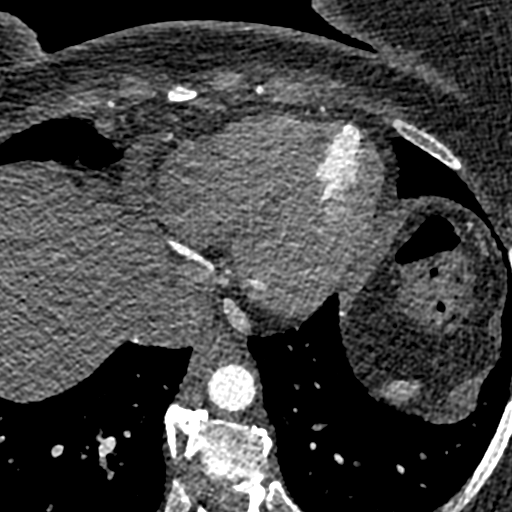
[im 1968/5117  vessel]
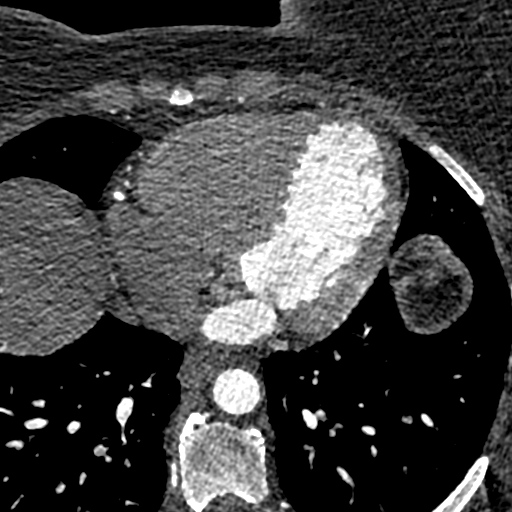
[im 1968/5117  lung]
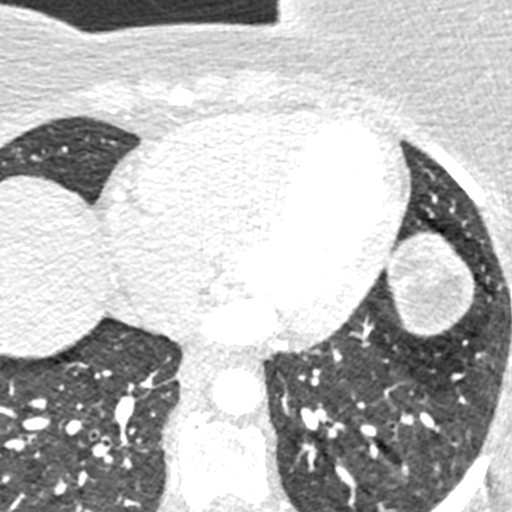
[im 2362/5117  vessel]
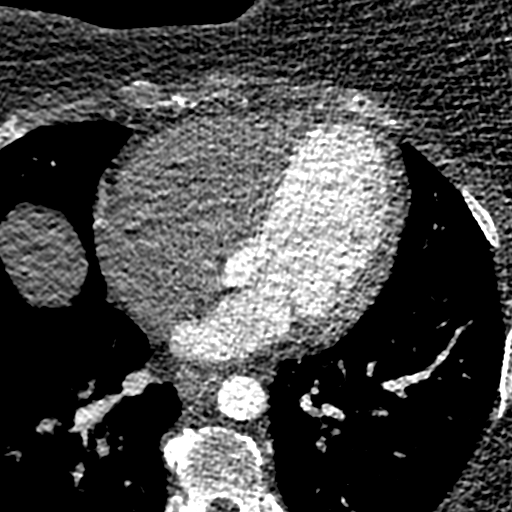
[im 2755/5117  vessel]
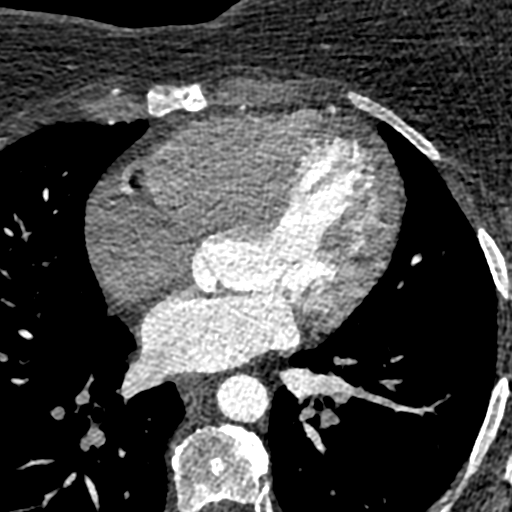
[im 3149/5117  vessel]
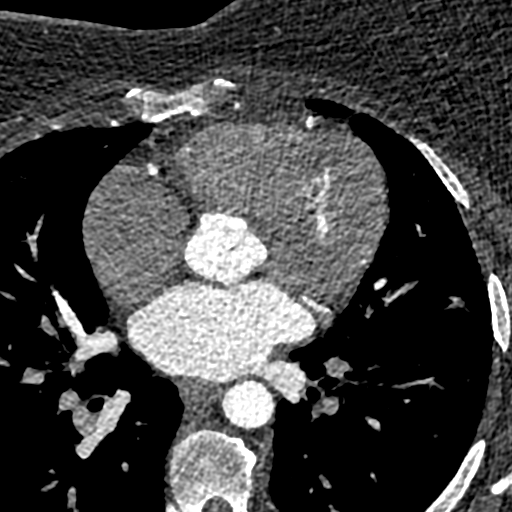
[im 3542/5117  vessel]
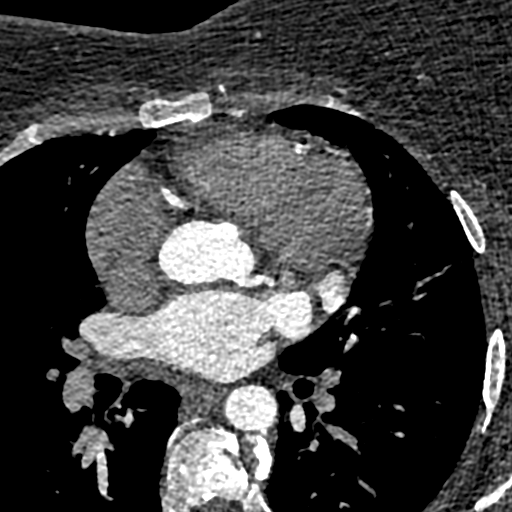
[im 3542/5117  lung]
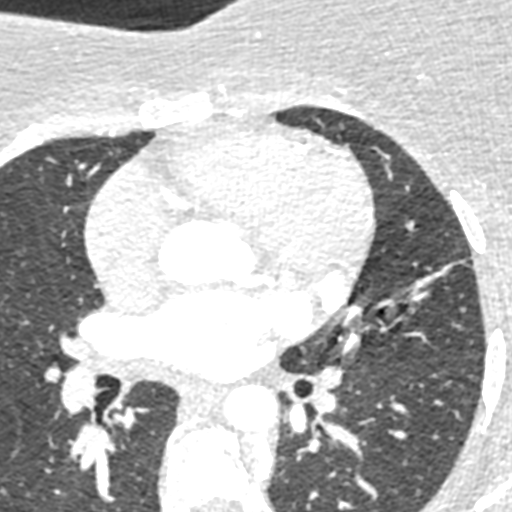
[im 3936/5117  vessel]
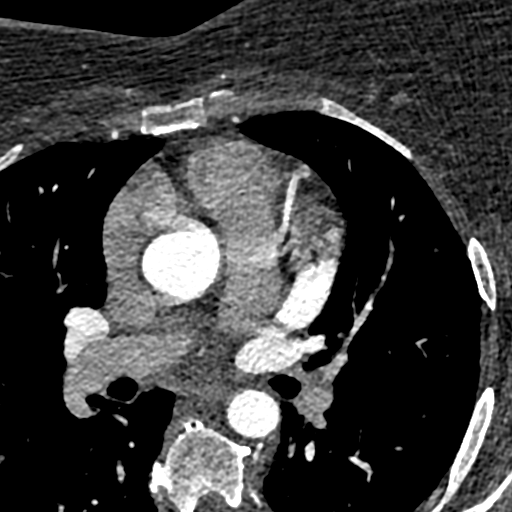
[im 4329/5117  vessel]
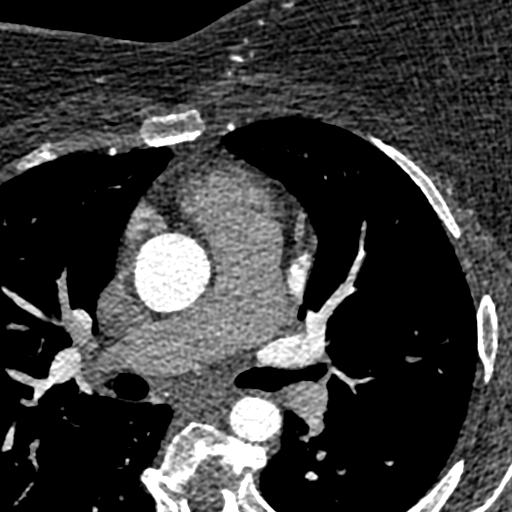
[im 4723/5117  vessel]
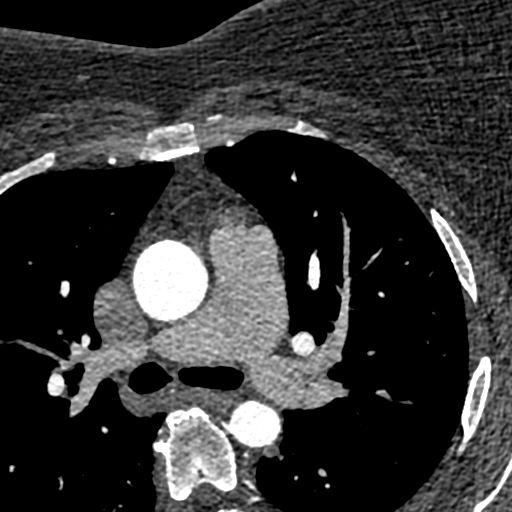

[12 of 20 positions shown; findings below may reference images not displayed]



Aorta:  Normal size.  No calcifications.  No dissection.

Aortic Valve:  Trileaflet.  No calcifications.

Coronary Arteries:  Normal coronary origin.  Right dominance.

RCA is a large dominant artery that gives rise to PDA and PLA. There
is no plaque.

Left main is a large artery that gives rise to LAD and LCX arteries.
Left main has no plaque.

LAD is a large vessel that gives rise to one diagonal artery anad
has no plaque.

LCX is a non-dominant artery that gives rise to one small OM1
branch. There is no plaque.

Other findings:

Normal pulmonary vein drainage into the left atrium.

Normal left atrial appendage without a thrombus.

Normal size of the pulmonary artery.
IMPRESSION: 1. Coronary calcium score of 0. This was 0 percentile for age and
sex matched control.

2. Normal coronary origin with right dominance.

3. The study quality is affected by motion, however there is no
evidence of CAD. CAD-RADS 0. Consider non-atherosclerotic causes of
chest pain.

4. Dilated pulmonary artery measuring 34 mm suggestive of pulmonary
hypertension.

EXAM:
OVER-READ INTERPRETATION  CT CHEST

The following report is an over-read performed by radiologist Dr.
does not include interpretation of cardiac or coronary anatomy or
pathology. The coronary CTA interpretation by the cardiologist is
attached.
FINDINGS: Cardiovascular: Normal heart size. No significant pericardial
effusion/thickening. Normal course and caliber of the visualized
thoracic aorta. Top-normal caliber main pulmonary artery (3.2 cm
diameter).

Mediastinum/Nodes: Unremarkable esophagus. No pathologically
enlarged mediastinal or hilar lymph nodes.

Lungs/Pleura: No pneumothorax. No pleural effusion. No acute
consolidative airspace disease, lung masses or significant pulmonary
nodules.

Upper abdomen: Enhancing 1.2 cm splenic lesion (series 9/image 311),
unchanged in size since [DATE] CT abdomen study, compatible with
a benign lesion, probably a splenic hemangioma.

Musculoskeletal: No aggressive appearing focal osseous lesions.
Moderate thoracic spondylosis.
IMPRESSION: No significant extracardiac findings.

*** End of Addendum ***
FINDINGS: A 100 kV prospective scan was triggered in the descending thoracic
aorta at 111 HU's. Axial non-contrast 3 mm slices were carried out
through the heart. The data set was analyzed on a dedicated work
station and scored using the Agatson method. Gantry rotation speed
was 250 msecs and collimation was .6 mm. No beta blockade and 0.8 mg
of sl NTG was given. The 3D data set was reconstructed in 5%
intervals of the 67-82 % of the R-R cycle. Diastolic phases were
analyzed on a dedicated work station using MPR, MIP and VRT modes.
The patient received 80 cc of contrast.

Aorta:  Normal size.  No calcifications.  No dissection.

Aortic Valve:  Trileaflet.  No calcifications.

Coronary Arteries:  Normal coronary origin.  Right dominance.

RCA is a large dominant artery that gives rise to PDA and PLA. There
is no plaque.

Left main is a large artery that gives rise to LAD and LCX arteries.
Left main has no plaque.

LAD is a large vessel that gives rise to one diagonal artery anad
has no plaque.

LCX is a non-dominant artery that gives rise to one small OM1
branch. There is no plaque.

Other findings:

Normal pulmonary vein drainage into the left atrium.

Normal left atrial appendage without a thrombus.

Normal size of the pulmonary artery.
IMPRESSION: 1. Coronary calcium score of 0. This was 0 percentile for age and
sex matched control.

2. Normal coronary origin with right dominance.

3. The study quality is affected by motion, however there is no
evidence of CAD. CAD-RADS 0. Consider non-atherosclerotic causes of
chest pain.

4. Dilated pulmonary artery measuring 34 mm suggestive of pulmonary
hypertension.

## 2019-10-02 MED ORDER — NITROGLYCERIN 0.4 MG SL SUBL
0.8000 mg | SUBLINGUAL_TABLET | Freq: Once | SUBLINGUAL | Status: AC
  Administered 2019-10-02: 0.8 mg via SUBLINGUAL

## 2019-10-02 MED ORDER — IOHEXOL 350 MG/ML SOLN
75.0000 mL | Freq: Once | INTRAVENOUS | Status: AC | PRN
  Administered 2019-10-02: 75 mL via INTRAVENOUS

## 2019-10-02 NOTE — Telephone Encounter (Signed)
-----   Message from Rise Mu, PA-C sent at 10/02/2019  4:10 PM EDT ----- No acute noncardiac findings.

## 2019-10-02 NOTE — Progress Notes (Signed)
Patient tolerated CT without incident. Drank water and ate crackers. Ambulated to exit steady gait.

## 2019-10-02 NOTE — Telephone Encounter (Signed)
  Notes recorded by Rise Mu, PA-C on 10/02/2019 at 2:54 PM EDT  Coronary CTA showed no calcium with no significant coronary atherosclerotic disease. Patient's chest pain is not related to atherosclerotic disease. Incidentally, there was mention of a dilated pulmonary artery which may be suggestive of pulmonary hypertension. Please schedule echo to further evaluate for pulmonary hypertension. Aggressive risk factor modification is recommended. Most recent lipid panel showed LDL is well controlled. Continue Lipitor. As long as the above echo is reassuring we will plan for her to keep her previously scheduled follow-up in 11/2019 with her primary cardiologist. Await radiology over read of noncardiac portion.  Call to patient to discuss CT results and POC from Christell Faith, [place].  Answered all questions. Order placed for echo.   Advised pt to call for any further questions or concerns.

## 2019-10-04 ENCOUNTER — Other Ambulatory Visit: Payer: Medicare Other

## 2019-10-04 NOTE — Telephone Encounter (Signed)
No ans no vm   °

## 2019-11-06 ENCOUNTER — Other Ambulatory Visit: Payer: Self-pay | Admitting: Internal Medicine

## 2019-11-07 NOTE — Telephone Encounter (Signed)
Pt missed appt in [place] [facility] yesterday. Called pt and spoke with her mother who stated pt will be home shortly and will give [place] a call back to r/s appt. Will send in refill at that time.

## 2019-11-07 NOTE — Telephone Encounter (Signed)
Spoke to pt who stated she has enough Coumadin to get her to her appointment on Monday.Pt has appointment to get her INR checked on 11/23.

## 2019-11-11 ENCOUNTER — Other Ambulatory Visit: Payer: Self-pay

## 2019-11-11 ENCOUNTER — Ambulatory Visit (INDEPENDENT_AMBULATORY_CARE_PROVIDER_SITE_OTHER): Payer: Medicare Other

## 2019-11-11 DIAGNOSIS — Z5181 Encounter for therapeutic drug level monitoring: Secondary | ICD-10-CM

## 2019-11-11 DIAGNOSIS — I639 Cerebral infarction, unspecified: Secondary | ICD-10-CM | POA: Diagnosis not present

## 2019-11-11 DIAGNOSIS — Z8673 Personal history of transient ischemic attack (TIA), and cerebral infarction without residual deficits: Secondary | ICD-10-CM | POA: Diagnosis not present

## 2019-11-11 LAB — POCT INR: INR: 3.4 — AB (ref 2.0–3.0)

## 2019-11-11 NOTE — Patient Instructions (Signed)
Please continue dosage of 1 pill every day except 1/2 tablets on Mondays & Fridays.  Recheck in 6 weeks.

## 2019-12-03 ENCOUNTER — Ambulatory Visit: Payer: Medicare Other | Admitting: Cardiovascular Disease

## 2019-12-05 ENCOUNTER — Other Ambulatory Visit: Payer: Medicare Other

## 2019-12-08 NOTE — Progress Notes (Deleted)
Cardiology Office Note  Date:  12/08/2019   ID:  Mary [name], DOB [birth year], MRN [id number]  PCP:  [name], MD   No chief complaint on file.   HPI:  Mary [name] is a 53 y.o. female   bariatric surgery. History of stroke Antiphospholipid syndrome,  on warfarin OSA , not on CPAP Previous loop monitor placed,  previous stroke, she has some difficulty with walking.  Who presents for routine follow-up of her stroke  Implantable loop recorder placed, performed for cryptogenic stroke Declined explant in the past, very nervous Previous records reviewed Ten episodes of 3+ sec pauses noted Unable to afford her CPAP  No new complaints on today's visit  Patient denies PND, orthopnea, edema. No loss of consciousness, no palpitations, no abdominal pain.  No TIA symptoms  Lab work reviewed with her Last cholesterol 2 years ago 120s.  Unclear if she was on a statin at that time HBA1C 5.7  Previous testing reviewed with her Negative stress test 10/20/2016  EKG personally reviewed by myself on todays visit Shows normal sinus rhythm rate 57 bpm no significant ST or T wave changes   PMH:   has a past medical history of Blood transfusion without reported diagnosis, Clotting disorder ([place]), Diabetes mellitus without complication ([place]), Hypertension, Other and unspecified ovarian cysts, Stroke ([place]), and Vaginal Pap smear, abnormal.  PSH:    Past Surgical History:  Procedure Laterality Date  . COLONOSCOPY WITH PROPOFOL N/A 11/10/2015   Procedure: COLONOSCOPY WITH PROPOFOL;  Surgeon: Lucilla Lame, MD;  Location: [facility] ENDOSCOPY;  Service: Endoscopy;  Laterality: N/A;  . ESOPHAGOGASTRODUODENOSCOPY (EGD) WITH PROPOFOL N/A 11/10/2015   Procedure: ESOPHAGOGASTRODUODENOSCOPY (EGD) WITH PROPOFOL;  Surgeon: Lucilla Lame, MD;  Location: [facility] ENDOSCOPY;  Service: Endoscopy;  Laterality: N/A;  . LAPAROSCOPIC GASTRIC SLEEVE RESECTION  02/17/2016   placement  . LOOP RECORDER IMPLANT N/A  04/25/2014   Procedure: LOOP RECORDER IMPLANT;  Surgeon: Coralyn Mark, MD;  Location: [facility] CATH LAB;  Service: Cardiovascular;  Laterality: N/A;  . LOOP RECORDER REMOVAL N/A 02/01/2018   Procedure: LOOP RECORDER REMOVAL;  Surgeon: Deboraha Sprang, MD;  Location: [facility] CV LAB;  Service: Cardiovascular;  Laterality: N/A;  . OVARIAN CYST REMOVAL    . TEE WITHOUT CARDIOVERSION N/A 01/25/2013   Procedure: TRANSESOPHAGEAL ECHOCARDIOGRAM (TEE);  Surgeon: Birdie Riddle, MD;  Location[facility] ENDOSCOPY;  Service: Cardiovascular;  Laterality: N/A;    Current Outpatient Medications  Medication Sig Dispense Refill  . amLODipine (NORVASC) 10 MG tablet Take 1 tablet (10 mg total) by mouth daily. 30 tablet 6  . atorvastatin (LIPITOR) 40 MG tablet Take 1 tablet (40 mg total) by mouth daily. 30 tablet 6  . docusate sodium (COLACE) 100 MG capsule Take 100 mg by mouth daily as needed for mild constipation.    . fluticasone (FLONASE) 50 MCG/ACT nasal spray Place 2 sprays into both nostrils daily. 16 g 6  . hydrochlorothiazide (HYDRODIURIL) 25 MG tablet Take 0.5 tablets (12.5 mg total) by mouth daily. 15 tablet 11  . loratadine (CLARITIN) 10 MG tablet Take 1 tablet (10 mg total) by mouth daily. To help with sneezing/nasal congestion 30 tablet 1  . metoprolol tartrate (LOPRESSOR) 25 MG tablet TAKE 25 MG BY MOUTH TWICE DAILY 60 tablet 6  . omeprazole (PRILOSEC) 20 MG capsule Take 1 capsule (20 mg total) by mouth daily. (Patient not taking: Reported on 09/02/2019) 30 capsule 3  . potassium chloride (K-DUR) 10 MEQ tablet Take 1 tablet (10 mEq  total) by mouth daily. 30 tablet 6  . potassium chloride SA (KLOR-CON) 20 MEQ tablet Take 2 tablets (40 mEq total) by mouth daily. 30 tablet 0  . Vitamin D, Ergocalciferol, (DRISDOL) 50000 units CAPS capsule Take 1 capsule (50,000 Units total) by mouth every 7 (seven) days. (Patient not taking: Reported on 07/16/2019) 12 capsule 0  . warfarin (COUMADIN) 5 MG tablet TAKE AS DIRECTED  BY COUMADIN CLINIC 100 tablet 0   No current facility-administered medications for this visit.     Allergies:   Patient has no known allergies.   Social History:  The patient  reports that she quit smoking about 13 years ago. Her smoking use included cigarettes. She has a 10.00 pack-year smoking history. She has never used smokeless tobacco. She reports that she does not drink alcohol or use drugs.   Family History:   family history includes Diabetes in her mother, sister, and another family member; Diabetes Mellitus II in her mother; Stroke in her father, mother, and another family member; Transient ischemic attack in her father.    Review of Systems: Review of Systems  Constitutional: Negative.   Respiratory: Negative.   Cardiovascular: Negative.   Gastrointestinal: Negative.   Musculoskeletal: Negative.   Neurological: Negative.   Psychiatric/Behavioral: Negative.   All other systems reviewed and are negative.    PHYSICAL EXAM: VS:  There were no vitals taken for this visit. , BMI There is no height or weight on file to calculate BMI. GEN: Well nourished, well developed, in no acute distress  HEENT: normal  Neck: no JVD, carotid bruits, or masses Cardiac: RRR; no murmurs, rubs, or gallops,no edema  Respiratory:  clear to auscultation bilaterally, normal work of breathing GI: soft, nontender, nondistended, + BS MS: no deformity or atrophy  Skin: warm and dry, no rash Neuro:  Strength and sensation are intact Psych: euthymic mood, full affect    Recent Labs: 09/17/2019: ALT 26; BUN 14; Creatinine, Ser 0.99; Potassium 3.2; Sodium 140    Lipid Panel Lab Results  Component Value Date   CHOL 106 09/17/2019   HDL 50 09/17/2019   LDLCALC 34 09/17/2019   TRIG 112 09/17/2019      Wt Readings from Last 3 Encounters:  09/02/19 231 lb 12 oz (105.1 kg)  06/04/19 230 lb (104.3 kg)  01/29/19 227 lb 9.6 oz (103.2 kg)       ASSESSMENT AND PLAN:  Cryptogenic stroke  (HCC) On warfarin for antiphospholipid syndrome No further TIA or stroke symptoms  Essential hypertension - Plan: EKG 12-Lead Blood pressure is well controlled on today's visit. No changes made to the medications.  Antiphospholipid syndrome (HCC) On warfarin as above  Sleep apnea, unspecified type Unable to afford her CPAP Having pauses as recorded on loop monitor Asymptomatic  Loop monitor explant Long discussion with her concerning need to explant the loop She is anxious, walked her through the procedure She is willing to have this done in [place] We will discuss with Dr. Caryl Comes  Disposition:   F/U as needed   Total encounter time more than 25 minutes  Greater than 50% was spent in counseling and coordination of care with the patient   No orders of the defined types were placed in this encounter.    Signed, Esmond Plants, M.D., Ph.D. 12/08/2019  [place], [place]

## 2019-12-09 ENCOUNTER — Ambulatory Visit: Payer: Medicare Other | Admitting: Cardiovascular Disease

## 2019-12-11 ENCOUNTER — Other Ambulatory Visit: Payer: Self-pay

## 2019-12-11 NOTE — Patient Outreach (Signed)
[facility]) Care Management  12/11/2019  Mary [name] [birth year] [id number]   Medication Adherence call to Mary [name] patient answer but hung up the phone,patient is showing past due on Atorvastatin 40 mg under [place].   [facility] Management Direct Dial [phone]  Fax [phone] [name]

## 2019-12-16 ENCOUNTER — Emergency Department (HOSPITAL_COMMUNITY)
Admission: EM | Admit: 2019-12-16 | Discharge: 2019-12-16 | Disposition: A | Discharge status: Home / Self Care | Payer: Medicare Other | Attending: Emergency Medicine | Admitting: Emergency Medicine

## 2019-12-16 ENCOUNTER — Other Ambulatory Visit: Payer: Self-pay

## 2019-12-16 ENCOUNTER — Ambulatory Visit (HOSPITAL_COMMUNITY)
Admission: EM | Admit: 2019-12-16 | Discharge: 2019-12-16 | Disposition: A | Discharge status: Home / Self Care | Payer: Medicare Other | Source: Home / Self Care

## 2019-12-16 ENCOUNTER — Emergency Department (HOSPITAL_COMMUNITY): Payer: Medicare Other

## 2019-12-16 ENCOUNTER — Telehealth: Payer: Self-pay | Admitting: Family Medicine

## 2019-12-16 DIAGNOSIS — R55 Syncope and collapse: Secondary | ICD-10-CM | POA: Diagnosis not present

## 2019-12-16 DIAGNOSIS — E119 Type 2 diabetes mellitus without complications: Secondary | ICD-10-CM | POA: Diagnosis not present

## 2019-12-16 DIAGNOSIS — Z8673 Personal history of transient ischemic attack (TIA), and cerebral infarction without residual deficits: Secondary | ICD-10-CM | POA: Insufficient documentation

## 2019-12-16 DIAGNOSIS — Z7901 Long term (current) use of anticoagulants: Secondary | ICD-10-CM | POA: Diagnosis not present

## 2019-12-16 DIAGNOSIS — R4789 Other speech disturbances: Secondary | ICD-10-CM

## 2019-12-16 DIAGNOSIS — I10 Essential (primary) hypertension: Secondary | ICD-10-CM | POA: Diagnosis not present

## 2019-12-16 DIAGNOSIS — R42 Dizziness and giddiness: Secondary | ICD-10-CM | POA: Diagnosis not present

## 2019-12-16 DIAGNOSIS — G459 Transient cerebral ischemic attack, unspecified: Secondary | ICD-10-CM | POA: Diagnosis not present

## 2019-12-16 DIAGNOSIS — Z87891 Personal history of nicotine dependence: Secondary | ICD-10-CM | POA: Insufficient documentation

## 2019-12-16 LAB — COMPREHENSIVE METABOLIC PANEL
ALT: 17 U/L (ref 0–44)
AST: 20 U/L (ref 15–41)
Albumin: 3.3 g/dL — ABNORMAL LOW (ref 3.5–5.0)
Alkaline Phosphatase: 59 U/L (ref 38–126)
Anion gap: 10 (ref 5–15)
BUN: 15 mg/dL (ref 6–20)
CO2: 28 mmol/L (ref 22–32)
Calcium: 8.6 mg/dL — ABNORMAL LOW (ref 8.9–10.3)
Chloride: 102 mmol/L (ref 98–111)
Creatinine, Ser: 1.2 mg/dL — ABNORMAL HIGH (ref 0.44–1.00)
GFR calc Af Amer: 59 mL/min — ABNORMAL LOW (ref >60)
GFR calc non Af Amer: 51 mL/min — ABNORMAL LOW (ref >60)
Glucose, Bld: 90 mg/dL (ref 70–99)
Potassium: 3 mmol/L — ABNORMAL LOW (ref 3.5–5.1)
Sodium: 140 mmol/L (ref 135–145)
Total Bilirubin: 0.6 mg/dL (ref 0.3–1.2)
Total Protein: 7.6 g/dL (ref 6.5–8.1)

## 2019-12-16 LAB — I-STAT CHEM 8, ED
BUN: 17 mg/dL (ref 6–20)
Calcium, Ion: 1.11 mmol/L — ABNORMAL LOW (ref 1.15–1.40)
Chloride: 101 mmol/L (ref 98–111)
Creatinine, Ser: 1.1 mg/dL — ABNORMAL HIGH (ref 0.44–1.00)
Glucose, Bld: 86 mg/dL (ref 70–99)
HCT: 35 % — ABNORMAL LOW (ref 36.0–46.0)
Hemoglobin: 11.9 g/dL — ABNORMAL LOW (ref 12.0–15.0)
Potassium: 3 mmol/L — ABNORMAL LOW (ref 3.5–5.1)
Sodium: 140 mmol/L (ref 135–145)
TCO2: 30 mmol/L (ref 22–32)

## 2019-12-16 LAB — CBC
HCT: 35 % — ABNORMAL LOW (ref 36.0–46.0)
Hemoglobin: 11 g/dL — ABNORMAL LOW (ref 12.0–15.0)
MCH: 23.3 pg — ABNORMAL LOW (ref 26.0–34.0)
MCHC: 31.4 g/dL (ref 30.0–36.0)
MCV: 74.2 fL — ABNORMAL LOW (ref 80.0–100.0)
Platelets: 262 K/uL (ref 150–400)
RBC: 4.72 MIL/uL (ref 3.87–5.11)
RDW: 16.2 % — ABNORMAL HIGH (ref 11.5–15.5)
WBC: 4 K/uL (ref 4.0–10.5)
nRBC: 0 % (ref 0.0–0.2)

## 2019-12-16 LAB — PROTIME-INR
INR: 2.4 — ABNORMAL HIGH (ref 0.8–1.2)
Prothrombin Time: 25.7 s — ABNORMAL HIGH (ref 11.4–15.2)

## 2019-12-16 LAB — DIFFERENTIAL
Abs Immature Granulocytes: 0.02 K/uL (ref 0.00–0.07)
Basophils Absolute: 0.1 K/uL (ref 0.0–0.1)
Basophils Relative: 1 %
Eosinophils Absolute: 0.2 K/uL (ref 0.0–0.5)
Eosinophils Relative: 6 %
Immature Granulocytes: 1 %
Lymphocytes Relative: 42 %
Lymphs Abs: 1.7 K/uL (ref 0.7–4.0)
Monocytes Absolute: 0.3 K/uL (ref 0.1–1.0)
Monocytes Relative: 8 %
Neutro Abs: 1.7 K/uL (ref 1.7–7.7)
Neutrophils Relative %: 42 %

## 2019-12-16 LAB — APTT: aPTT: 59 seconds — ABNORMAL HIGH (ref 24–36)

## 2019-12-16 LAB — CBG MONITORING, ED: Glucose-Capillary: 80 mg/dL (ref 70–99)

## 2019-12-16 IMAGING — MR MR HEAD W/O CM
9 of 11 series · 32 of 48 positions shown · non-contrast
Comparison: Prior CT from earlier the same day.

CLINICAL DATA: Initial evaluation for TIA.

EXAM:
MRI HEAD WITHOUT CONTRAST
TECHNIQUE: Multiplanar, multiecho pulse sequences of the brain and surrounding
structures were obtained without intravenous contrast.

[Series 2: DWI · axial · 3.0mm · 0.94mm/px · z∈[-98,+45]mm · 7 of 104 slices shown (1 of 2)]
[im 1/104]
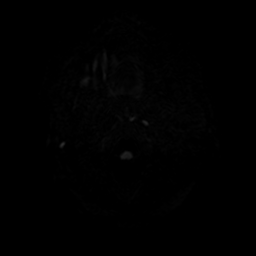
[im 18/104]
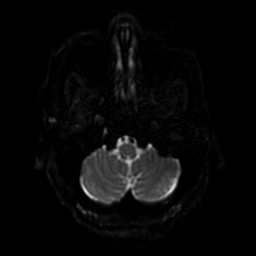
[im 35/104]
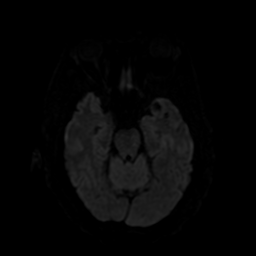
[im 52/104]
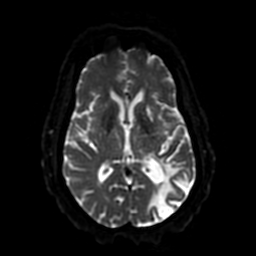
[im 69/104]
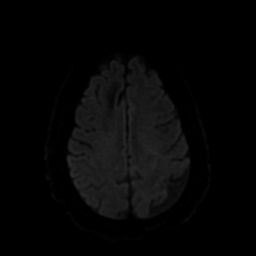
[im 86/104]
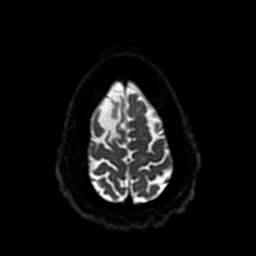
[im 104/104]
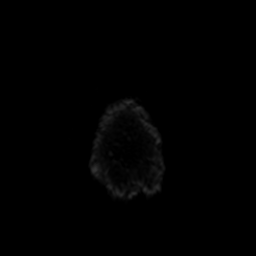

[Series 3: DWI · coronal · 4.0mm · 0.94mm/px · 6 of 74 slices shown (2 of 2)]
[im 1/74]
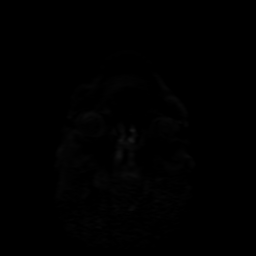
[im 15/74]
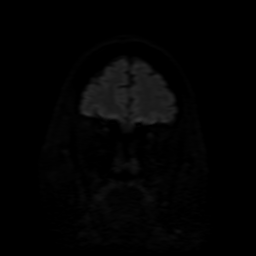
[im 30/74]
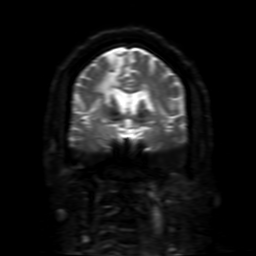
[im 44/74]
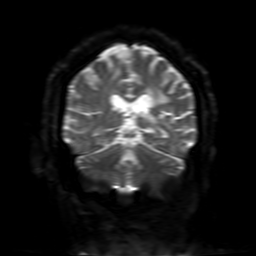
[im 59/74]
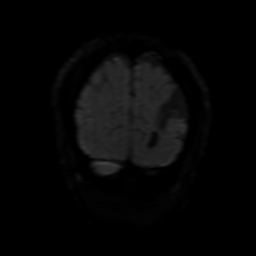
[im 74/74]
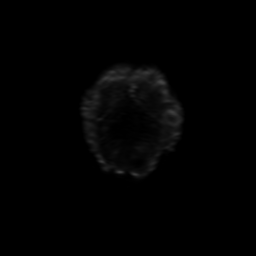

[Series 4: FLAIR · sagittal · 5.0mm · 0.47mm/px · 2 of 25 slices shown (1 of 2)]
[im 1/25]
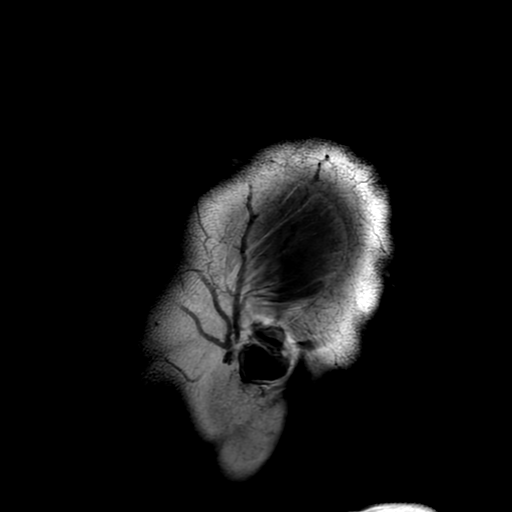
[im 25/25]
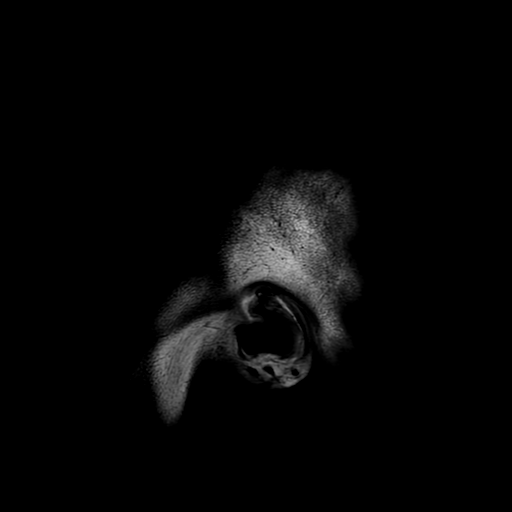

[Series 5: FLAIR · axial · 3.0mm · 0.41mm/px · z∈[-93,+47]mm · 2 of 26 slices shown (2 of 2)]
[im 1/26]
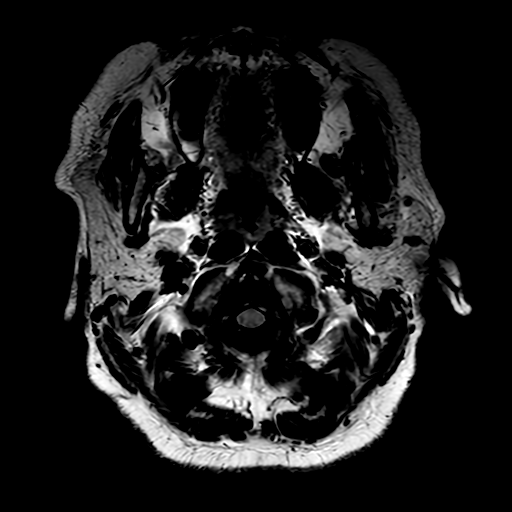
[im 26/26]
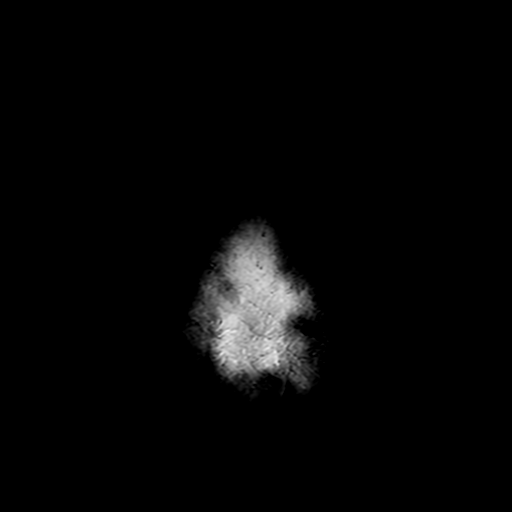

[Series 6: (person_name) · axial · 3.0mm · 0.41mm/px · z∈[-95,-33]mm · 4 of 104 slices shown]
[im 1/104]
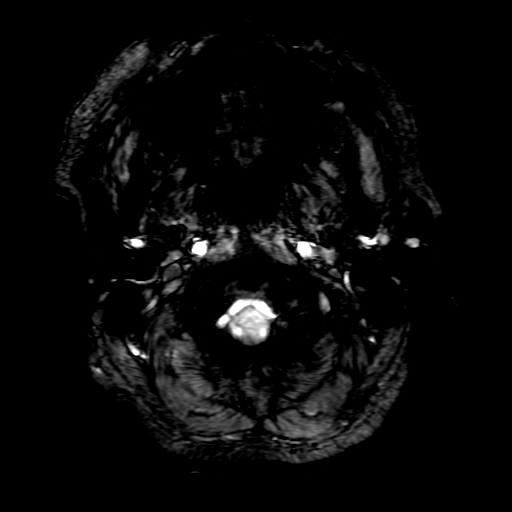
[im 15/104]
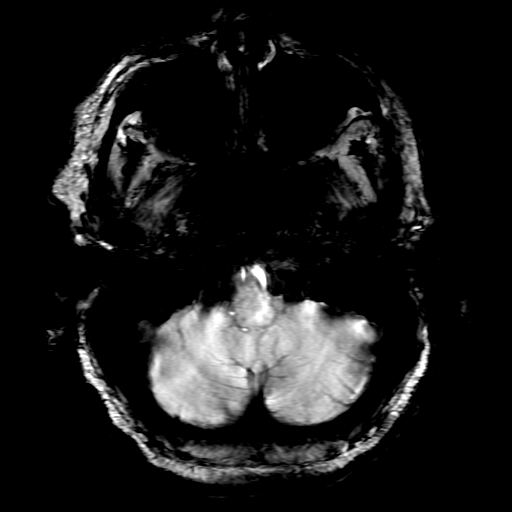
[im 30/104]
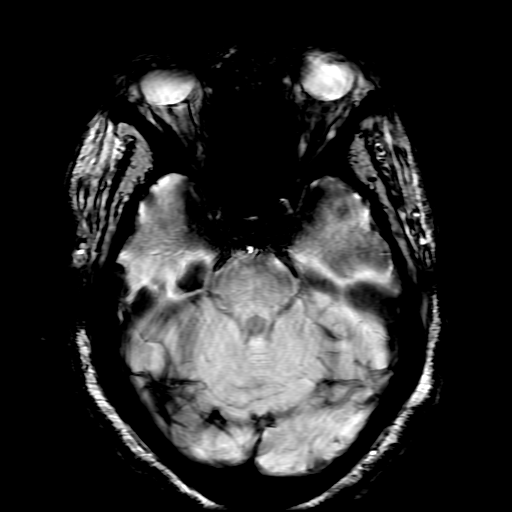
[im 45/104]
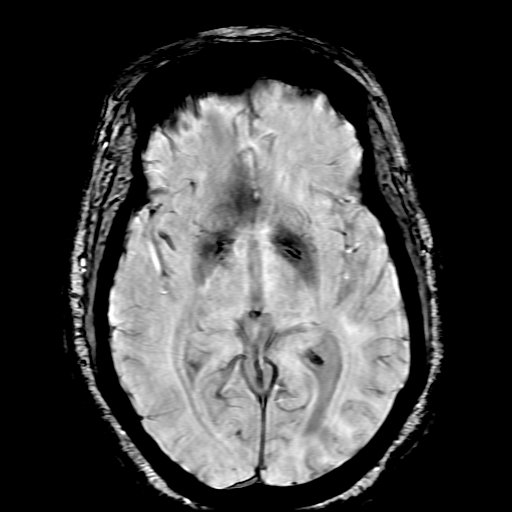

[Series 7: T2 · axial · 5.0mm · 0.45mm/px · z∈[-96,+44]mm · 2 of 26 slices shown (1 of 2)]
[im 1/26]
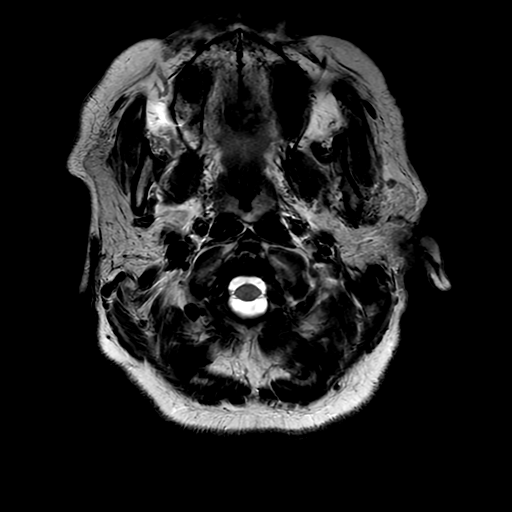
[im 26/26]
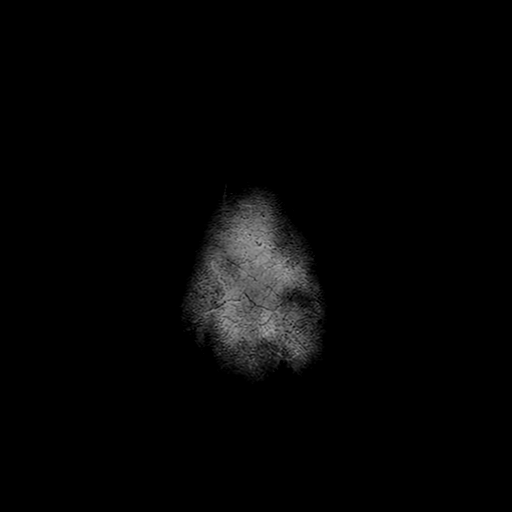

[Series 9: T2 · coronal · 5.0mm · 0.39mm/px · 2 of 31 slices shown (2 of 2)]
[im 1/31]
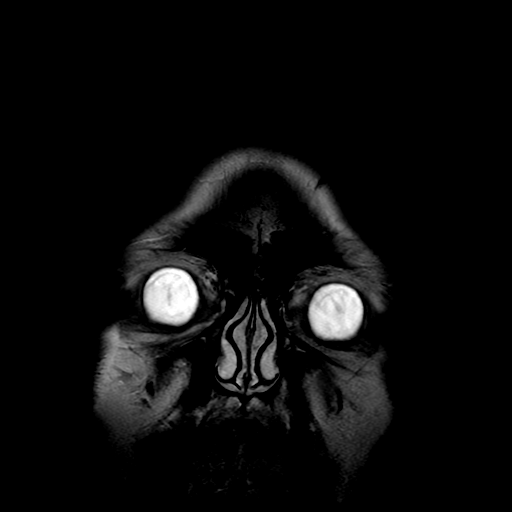
[im 31/31]
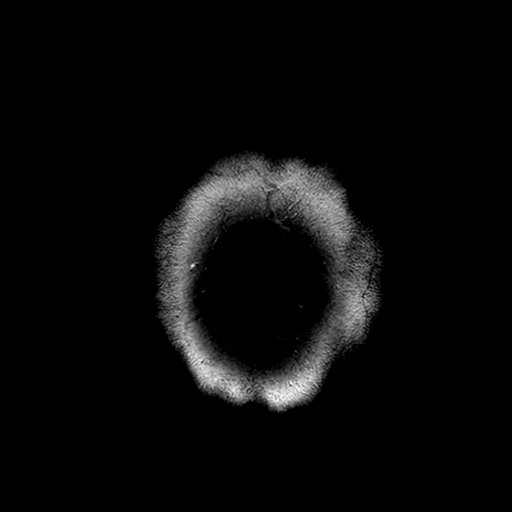

[Series 250: ADC · axial · 3.0mm · 0.94mm/px · z∈[-98,+45]mm · 4 of 52 slices shown (1 of 2)]
[im 1/52]
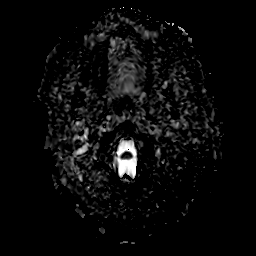
[im 18/52]
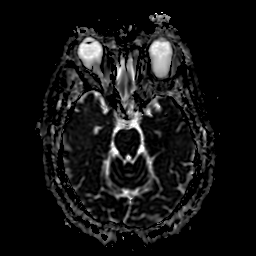
[im 35/52]
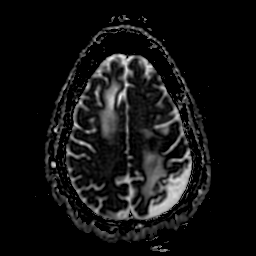
[im 52/52]
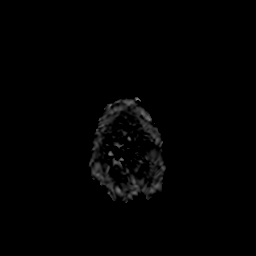

[Series 350: ADC · coronal · 4.0mm · 0.94mm/px · 3 of 37 slices shown (2 of 2)]
[im 1/37]
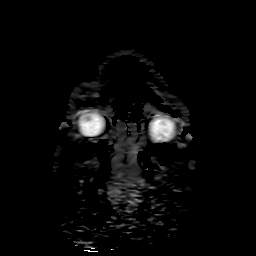
[im 19/37]
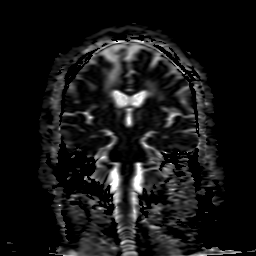
[im 37/37]
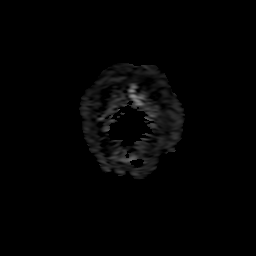

[32 of 48 positions shown; findings below may reference images not displayed]

FINDINGS: Brain: Generalized age-related cerebral atrophy. Patchy and
confluent T2/FLAIR hyperintensity within the periventricular deep
white matter both cerebral hemispheres most consistent with chronic
small vessel ischemic disease, moderate in nature. Multiple
superimposed remote lacunar infarcts present within the bilateral
basal ganglia/corona radiata. Areas of encephalomalacia and gliosis
involving the parasagittal right frontal lobe and left
parieto-occipital region compatible with chronic ischemic infarcts.

No abnormal foci of restricted diffusion to suggest acute or
subacute ischemia. Gray-white matter differentiation otherwise
maintained. No acute intracranial hemorrhage. Focus of
susceptibility artifact present at the anterior left temporal lobe,
consistent with a chronic microhemorrhage, of doubtful significance
in isolation.

No mass lesion, midline shift or mass effect. No hydrocephalus. No
extra-axial fluid collection. Pituitary gland normal.

Vascular: Major intracranial vascular flow voids are maintained.

Skull and upper cervical spine: Craniocervical junction normal. Bone
marrow signal intensity within normal limits. No scalp soft tissue
abnormality.

Sinuses/Orbits: Globes and orbital soft tissues within normal
limits. Paranasal sinuses are clear. No mastoid effusion.

Other: None.
IMPRESSION: 1. No acute intracranial abnormality.
2. Chronic right ACA and left MCA territory infarcts.
3. Underlying moderate chronic microvascular ischemic disease with
multiple remote lacunar infarcts involving the bilateral basal
ganglia/corona radiata.

## 2019-12-16 IMAGING — CT CT HEAD W/O CM
4 series · 16 of 47 positions shown, 18 images · non-contrast
Comparison: [DATE]

CLINICAL DATA: TIA. Possible stroke. Shaking episode six days ago.

EXAM:
CT HEAD WITHOUT CONTRAST
TECHNIQUE: Contiguous axial images were obtained from the base of the skull
through the vertex without intravenous contrast.

[Series 3: head without · axial · non-contrast · 0.44mm/px · z∈[-97,+23]mm · 7 of 33 slices shown, 9 images]
[im 5/33  brain]
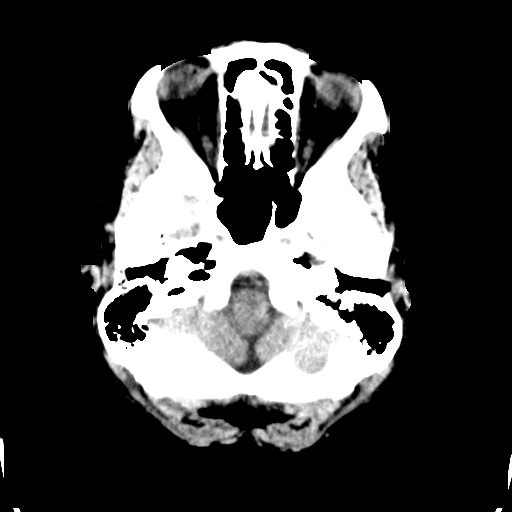
[im 5/33  bone]
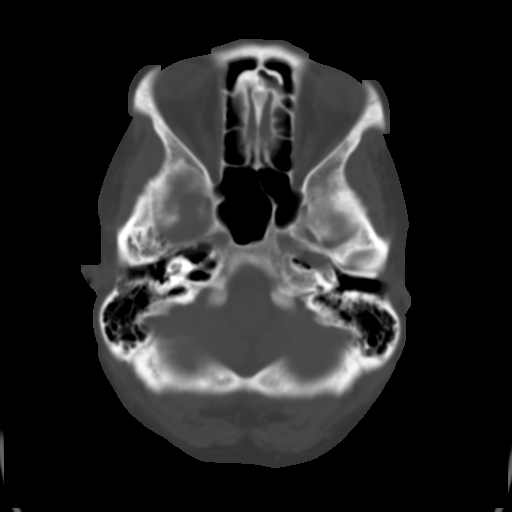
[im 9/33  brain]
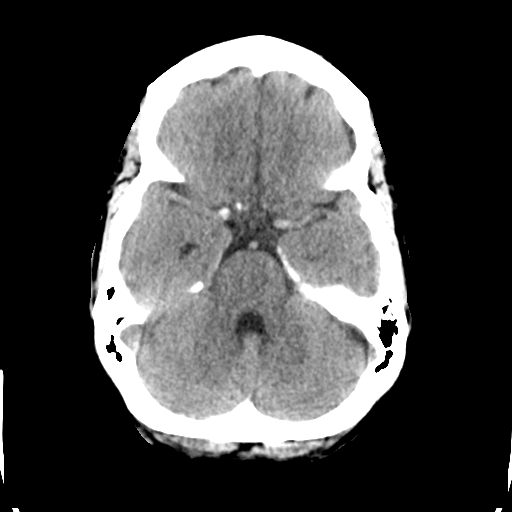
[im 13/33  brain]
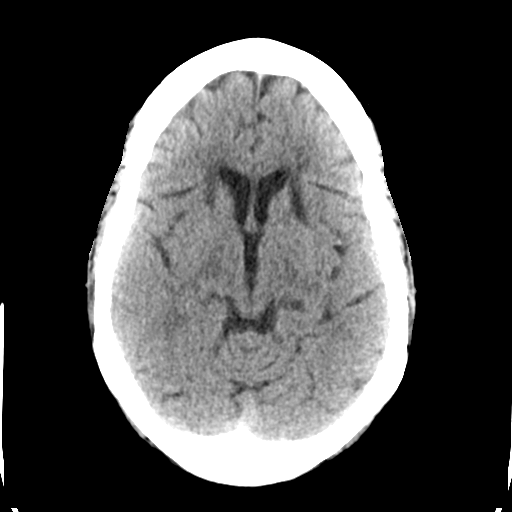
[im 17/33  brain]
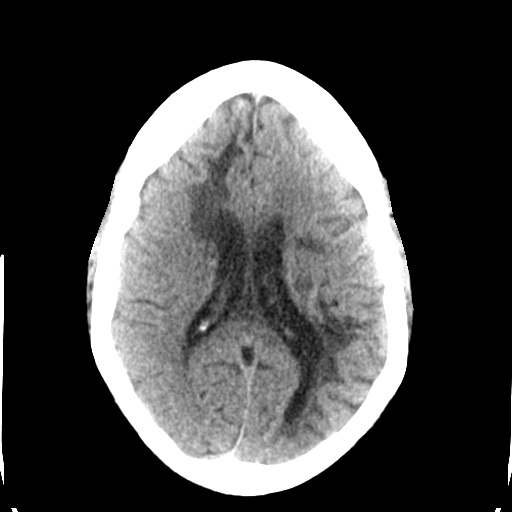
[im 21/33  brain]
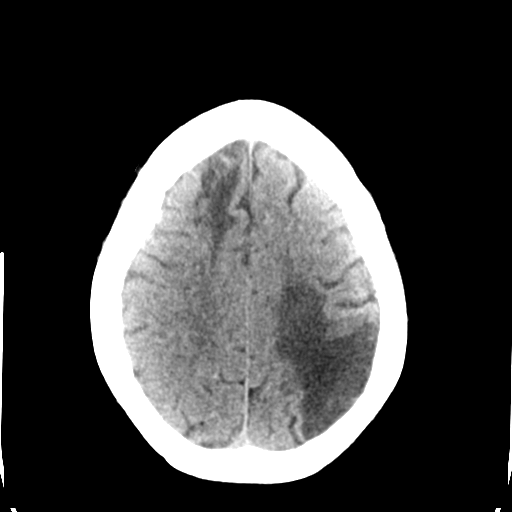
[im 21/33  bone]
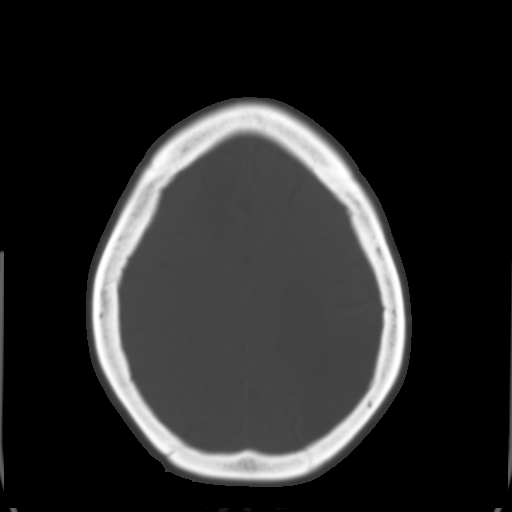
[im 25/33  brain]
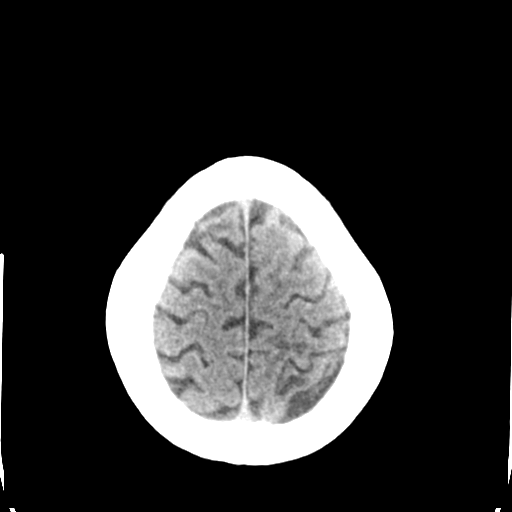
[im 29/33  brain]
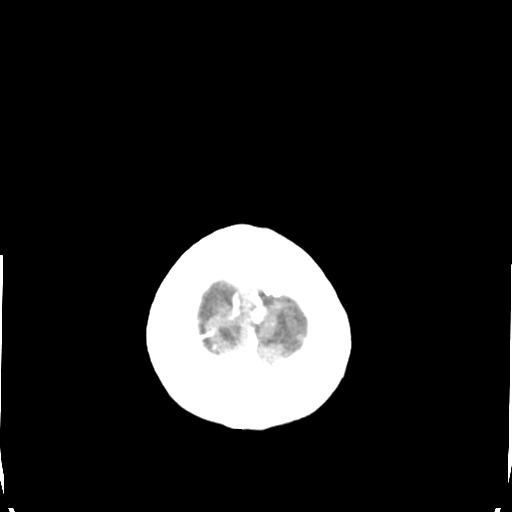

[Series 4: head bone · axial · 0.44mm/px · z∈[-101,-69]mm · 3 of 82 slices shown]
[im 9/82  bone]
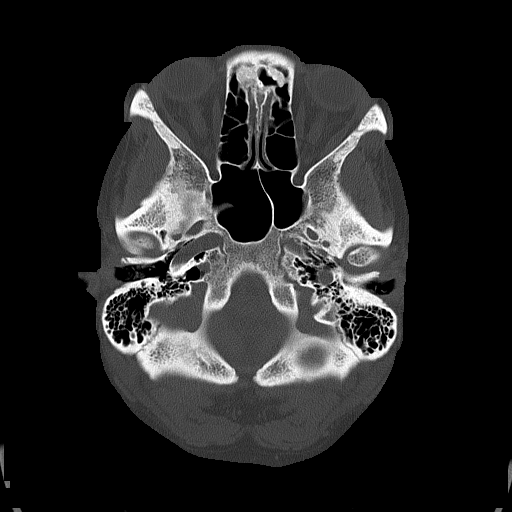
[im 17/82  bone]
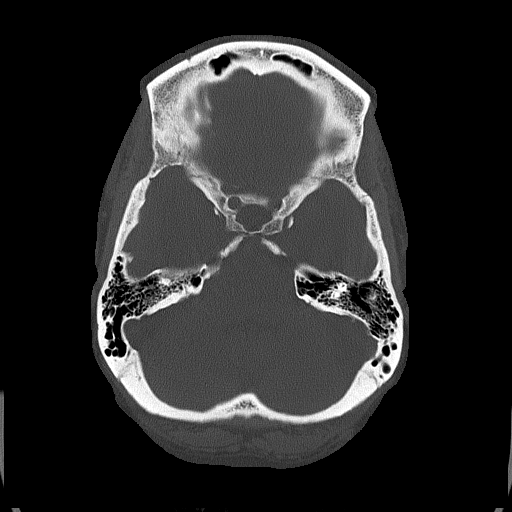
[im 25/82  bone]
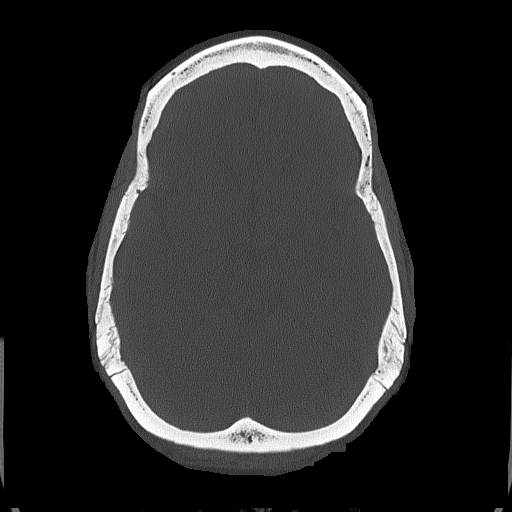

[Series 5: head without cor · coronal · non-contrast · 0.32mm/px · 3 of 71 slices shown]
[im 24/71  brain]
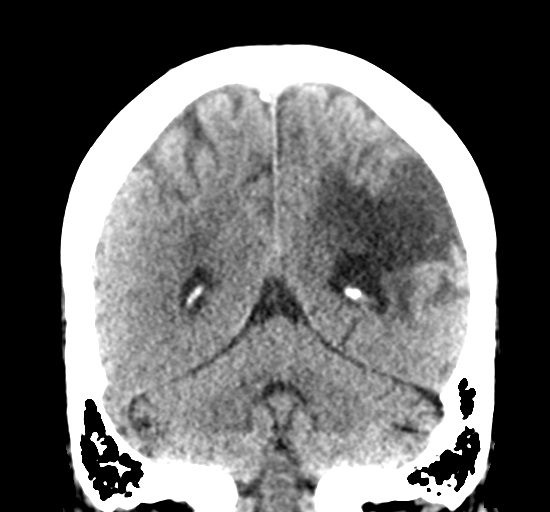
[im 32/71  brain]
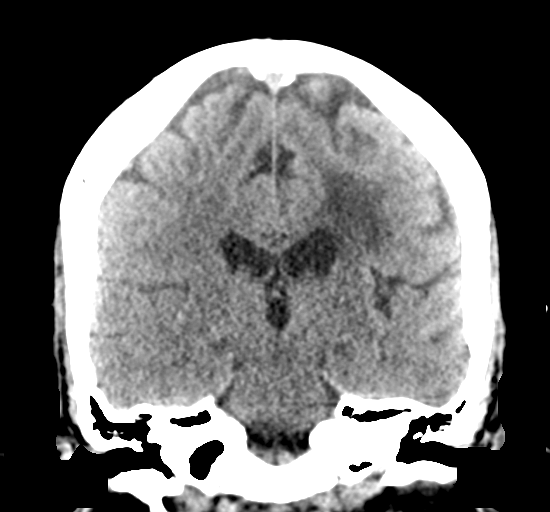
[im 39/71  brain]
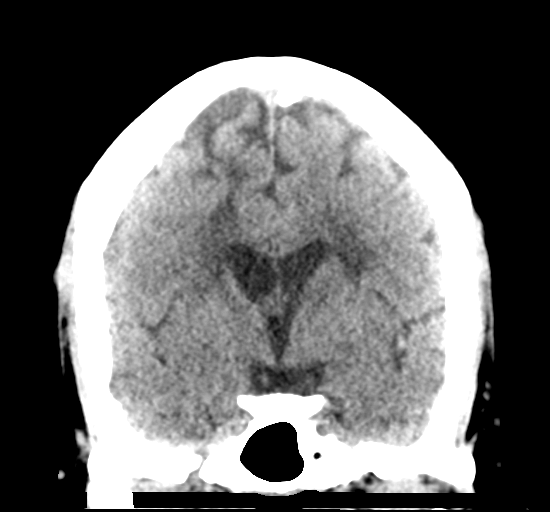

[Series 6: head without sag · sagittal · non-contrast · 0.32mm/px · 3 of 60 slices shown]
[im 20/60  brain]
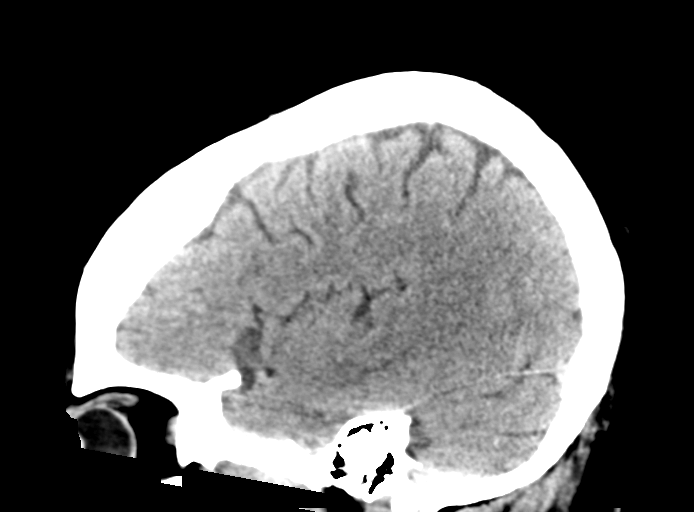
[im 30/60  brain]
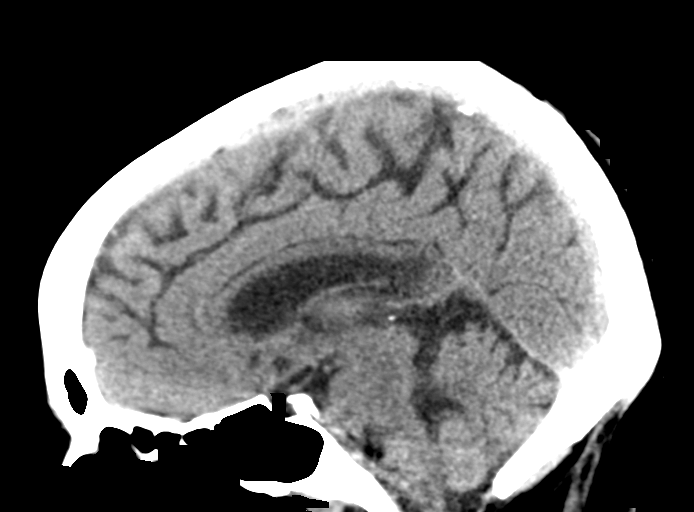
[im 40/60  brain]
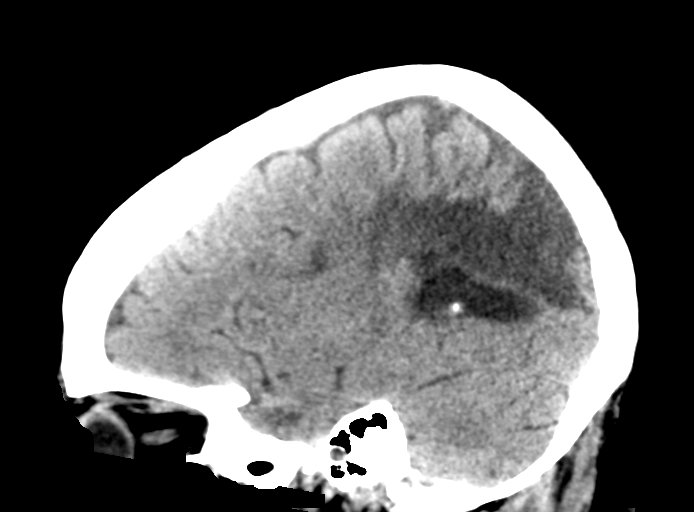

[16 of 47 positions shown; findings below may reference images not displayed]

FINDINGS: Brain: No abnormality seen affecting the brainstem or cerebellum.
There are old infarctions in both basal ganglia, both radiating
white matter tracts and the left parietal cortical and subcortical
brain. Possible subacute infarction in the radiating white matter
tracks anterior to the region of definite old infarction. Old
infarction in the right frontal cortical and subcortical brain,
newly seen since the previous exam but clearly not recent. No
hydrocephalus or extra-axial collection.

Vascular: No acute vascular finding.

Skull: Negative

Sinuses/Orbits: Clear/normal

Other: None
IMPRESSION: Numerous old infarctions with involvement of the basal ganglia,
radiating white matter tracts, left parietal cortical and
subcortical brain and right frontal cortical and subcortical brain.
Small region of low-density anterior to the main left parietal
infarction within the radiating white matter tracts could possibly
be more recent. Consider MRI for confirmation.

## 2019-12-16 MED ORDER — SODIUM CHLORIDE 0.9% FLUSH: 3.0000 mL | Freq: Once | INTRAVENOUS | Status: DC

## 2019-12-16 MED ORDER — POTASSIUM CHLORIDE CRYS ER 20 MEQ PO TBCR
40.0000 meq | EXTENDED_RELEASE_TABLET | Freq: Once | ORAL | Status: AC
  Administered 2019-12-16: 40 meq via ORAL
  Filled 2019-12-16: qty 2

## 2019-12-16 NOTE — ED Provider Notes (Signed)
Emergency Department Provider Note   I have reviewed the triage vital signs and the nursing notes.   HISTORY  Chief Complaint Stroke Symptoms   HPI CHRISTEL BIELEFELDT is a 53 y.o. female with PMH of HTN, DM, history of CVA, and is on Coumadin presents to the emergency department for evaluation after near syncope type event with speech disturbance.  The event occurred 5 days ago while at work.  She states she suddenly developed diaphoresis and "I froze" in place.  She denies any chest tightness, palpitations, or shortness of breath symptoms.  No syncope or falls.  No unilateral weakness/numbness.  The episode lasted for approximately 5 minutes.  She states during the episode she could not speak.  She was taken over to her chair and EMS was called and came out to evaluate.  Immediately after the episode she returned to normal and felt okay so refused transfer to the hospital.  She drove herself home and has been feeling fine over the past 5 days but her job is concerned and wanted her to be evaluated prior to returning to work which is why she is here today.  She denies difficulty ambulating.  No return of symptoms.   Past Medical History:  Diagnosis Date  . Blood transfusion without reported diagnosis    transfusion December 2016  . Clotting disorder (HCC)    left leg  . Diabetes mellitus without complication ([place])   . Hypertension   . Other and unspecified ovarian cysts   . Stroke ([place])   . Vaginal Pap smear, abnormal     Patient Active Problem List   Diagnosis Date Noted  . Porokeratosis 06/10/2019  . Plantar flexed metatarsal bone of left foot 06/10/2019  . ASCUS of cervix with negative high risk HPV 06/04/2019  . Sleep apnea 07/07/2016  . Anemia, iron deficiency 06/27/2016  . Diabetes mellitus ([place]) 03/09/2016  . Acute posthemorrhagic anemia   . OB + stool   . Benign neoplasm of ascending colon   . Sepsis ([place]) 11/09/2015  . Fecal occult blood test positive 11/09/2015  .  Absolute anemia   . Cerebral infarction due to embolism of left middle cerebral artery ([place]) 07/16/2015  . Antiphospholipid syndrome ([place]) 11/17/2014  . Cryptogenic stroke ([place]) 10/08/2014  . Obesity 10/08/2014  . Unspecified vitamin D deficiency 09/08/2014  . Anemia, unspecified 09/08/2014  . Severe obesity (BMI >= 40) ([place]) 08/21/2013  . Acquired dyslexia 07/12/2013  . Aphasia due to stroke 07/12/2013  . Headache 07/12/2013  . 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 History of stroke 01/24/2013  . HTN (hypertension) 01/24/2013    Past Surgical History:  Procedure Laterality Date  . COLONOSCOPY WITH PROPOFOL N/A 11/10/2015   Procedure: COLONOSCOPY WITH PROPOFOL;  Surgeon: Lucilla Lame, MD;  Location: [facility] ENDOSCOPY;  Service: Endoscopy;  Laterality: N/A;  . ESOPHAGOGASTRODUODENOSCOPY (EGD) WITH PROPOFOL N/A 11/10/2015   Procedure: ESOPHAGOGASTRODUODENOSCOPY (EGD) WITH PROPOFOL;  Surgeon: Lucilla Lame, MD;  Location: [facility] ENDOSCOPY;  Service: Endoscopy;  Laterality: N/A;  . LAPAROSCOPIC GASTRIC SLEEVE RESECTION  02/17/2016   placement  . LOOP RECORDER IMPLANT N/A 04/25/2014   Procedure: LOOP RECORDER IMPLANT;  Surgeon: Coralyn Mark, MD;  Location: [facility] CATH LAB;  Service: Cardiovascular;  Laterality: N/A;  . LOOP RECORDER REMOVAL N/A 02/01/2018   Procedure: LOOP RECORDER REMOVAL;  Surgeon: Deboraha Sprang, MD;  Location: [facility] CV LAB;  Service: Cardiovascular;  Laterality: N/A;  . OVARIAN CYST REMOVAL    . TEE WITHOUT  CARDIOVERSION N/A 01/25/2013   Procedure: TRANSESOPHAGEAL ECHOCARDIOGRAM (TEE);  Surgeon: Birdie Riddle, MD;  Location: [facility] ENDOSCOPY;  Service: Cardiovascular;  Laterality: N/A;    Allergies Patient has no known allergies.  Family History  Problem Relation Age of Onset  . Diabetes Mellitus II Mother   . Diabetes Mother   . Stroke Mother   . Transient ischemic attack Father   . Stroke Father   . Stroke Other   . Diabetes Other   . Diabetes Sister      Social History Social History   Tobacco Use  . Smoking status: Former Smoker    Packs/day: 1.00    Years: 10.00    Pack years: 10.00    Types: Cigarettes    Quit date: 07/14/2006    Years since quitting: 13.4  . Smokeless tobacco: Never Used  . Tobacco comment: quit smoking around 2007  Substance Use Topics  . Alcohol use: No    Alcohol/week: 0.0 standard drinks  . Drug use: No    Review of Systems  Constitutional: No fever/chills Eyes: No visual changes. ENT: No sore throat. Cardiovascular: Denies chest pain. Positive near syncope.  Respiratory: Denies shortness of breath. Gastrointestinal: No abdominal pain.  No nausea, no vomiting.  No diarrhea.  No constipation. Genitourinary: Negative for dysuria. Musculoskeletal: Negative for back pain. Skin: Negative for rash. Neurological: Negative for headaches, focal weakness or numbness. Positive speech interruption (resolved).   10-point ROS otherwise negative.  ____________________________________________   PHYSICAL EXAM:  VITAL SIGNS: ED Triage Vitals  Enc Vitals Group     BP 12/16/19 1004 (!) 142/85     Pulse Rate 12/16/19 1004 (!) 56     Resp 12/16/19 1004 16     Temp 12/16/19 1004 98 F (36.7 C)     Temp Source 12/16/19 1004 Oral     SpO2 12/16/19 1004 98 %     Weight 12/16/19 1022 226 lb (102.5 kg)     Height 12/16/19 1022 5\' 5"  (1.651 m)   Constitutional: Alert and oriented. Well appearing and in no acute distress. Eyes: Conjunctivae are normal.  Head: Atraumatic. Nose: No congestion/rhinnorhea. Mouth/Throat: Mucous membranes are moist.  Neck: No stridor.   Cardiovascular: Normal rate, regular rhythm. Good peripheral circulation. Grossly normal heart sounds.   Respiratory: Normal respiratory effort.  No retractions. Lungs CTAB. Gastrointestinal: Soft and nontender. No distention.  Musculoskeletal: No lower extremity tenderness nor edema. No gross deformities of extremities. Neurologic:  Normal  speech and language. No gross focal neurologic deficits are appreciated. CN 2-12 normal. Normal gait in the room.  Skin:  Skin is warm, dry and intact. No rash noted. ____________________________________________   LABS (all labs ordered are listed, but only abnormal results are displayed)  Labs Reviewed  PROTIME-INR - Abnormal; Notable for the following components:      Result Value   Prothrombin Time 25.7 (*)    INR 2.4 (*)    All other components within normal limits  APTT - Abnormal; Notable for the following components:   aPTT 59 (*)    All other components within normal limits  CBC - Abnormal; Notable for the following components:   Hemoglobin 11.0 (*)    HCT 35.0 (*)    MCV 74.2 (*)    MCH 23.3 (*)    RDW 16.2 (*)    All other components within normal limits  COMPREHENSIVE METABOLIC PANEL - Abnormal; Notable for the following components:   Potassium 3.0 (*)    Creatinine,  Ser 1.20 (*)    Calcium 8.6 (*)    Albumin 3.3 (*)    GFR calc non Af Amer 51 (*)    GFR calc Af Amer 59 (*)    All other components within normal limits  I-STAT CHEM 8, ED - Abnormal; Notable for the following components:   Potassium 3.0 (*)    Creatinine, Ser 1.10 (*)    Calcium, Ion 1.11 (*)    Hemoglobin 11.9 (*)    HCT 35.0 (*)    All other components within normal limits  DIFFERENTIAL  CBG MONITORING, ED   ____________________________________________  RADIOLOGY  CT HEAD WO CONTRAST  Result Date: 12/16/2019 CLINICAL DATA:  TIA. Possible stroke. Shaking episode six days ago. EXAM: CT HEAD WITHOUT CONTRAST TECHNIQUE: Contiguous axial images were obtained from the base of the skull through the vertex without intravenous contrast. COMPARISON:  10/03/2014 FINDINGS: Brain: No abnormality seen affecting the brainstem or cerebellum. There are old infarctions in both basal ganglia, both radiating white matter tracts and the left parietal cortical and subcortical brain. Possible subacute infarction  in the radiating white matter tracks anterior to the region of definite old infarction. Old infarction in the right frontal cortical and subcortical brain, newly seen since the previous exam but clearly not recent. No hydrocephalus or extra-axial collection. Vascular: No acute vascular finding. Skull: Negative Sinuses/Orbits: Clear/normal Other: None IMPRESSION: Numerous old infarctions with involvement of the basal ganglia, radiating white matter tracts, left parietal cortical and subcortical brain and right frontal cortical and subcortical brain. Small region of low-density anterior to the main left parietal infarction within the radiating white matter tracts could possibly be more recent. Consider MRI for confirmation. Electronically Signed   By: Nelson Chimes M.D.   On: 12/16/2019 11:55   MR BRAIN WO CONTRAST  Result Date: 12/16/2019 CLINICAL DATA:  Initial evaluation for TIA. EXAM: MRI HEAD WITHOUT CONTRAST TECHNIQUE: Multiplanar, multiecho pulse sequences of the brain and surrounding structures were obtained without intravenous contrast. COMPARISON:  Prior CT from earlier the same day. FINDINGS: Brain: Generalized age-related cerebral atrophy. Patchy and confluent T2/FLAIR hyperintensity within the periventricular deep white matter both cerebral hemispheres most consistent with chronic small vessel ischemic disease, moderate in nature. Multiple superimposed remote lacunar infarcts present within the bilateral basal ganglia/corona radiata. Areas of encephalomalacia and gliosis involving the parasagittal right frontal lobe and left parieto-occipital region compatible with chronic ischemic infarcts. No abnormal foci of restricted diffusion to suggest acute or subacute ischemia. Gray-white matter differentiation otherwise maintained. No acute intracranial hemorrhage. Focus of susceptibility artifact present at the anterior left temporal lobe, consistent with a chronic microhemorrhage, of doubtful significance  in isolation. No mass lesion, midline shift or mass effect. No hydrocephalus. No extra-axial fluid collection. Pituitary gland normal. Vascular: Major intracranial vascular flow voids are maintained. Skull and upper cervical spine: Craniocervical junction normal. Bone marrow signal intensity within normal limits. No scalp soft tissue abnormality. Sinuses/Orbits: Globes and orbital soft tissues within normal limits. Paranasal sinuses are clear. No mastoid effusion. Other: None. IMPRESSION: 1. No acute intracranial abnormality. 2. Chronic right ACA and left MCA territory infarcts. 3. Underlying moderate chronic microvascular ischemic disease with multiple remote lacunar infarcts involving the bilateral basal ganglia/corona radiata. Electronically Signed   By: Jeannine Boga M.D.   On: 12/16/2019 19:09    ____________________________________________   PROCEDURES  Procedure(s) performed:   Procedures  None  ____________________________________________   INITIAL IMPRESSION / ASSESSMENT AND PLAN / ED COURSE  Pertinent labs & imaging  results that were available during my care of the patient were reviewed by me and considered in my medical decision making (see chart for details).   Patient presents to the emergency department with 5-minute episode while at work 5 days ago.  No return of symptoms.  There was some speech disturbance associated with the episode.  She has no focal neuro deficits on my exam.  Her lab work shows therapeutic INR, mild hypokalemia, and CT scan with mostly remote infarcts but question low-density area in the left parietal region which could represent new or more recent infarct.  Radiology recommending MRI which seems appropriate given the speech disturbance during this episode.  07:14 PM  MRI without acute CVA. Plan for PCP follow up and patient can return to work.  ____________________________________________  FINAL CLINICAL IMPRESSION(S) / ED DIAGNOSES  Final  diagnoses:  Near syncope  Episode of change in speech    MEDICATIONS GIVEN DURING THIS VISIT:  Medications  sodium chloride flush (NS) 0.9 % injection 3 mL (0 mLs Intravenous Hold 12/16/19 1604)  potassium chloride SA (KLOR-CON) CR tablet 40 mEq (40 mEq Oral Given 12/16/19 1604)    Note:  This document was prepared using Dragon voice recognition software and may include unintentional dictation errors.  Nanda [place], MD, [facility] Emergency Medicine    Catheryn Slifer, [facility], MD 12/16/19 [phone]

## 2019-12-16 NOTE — ED Notes (Signed)
Patient here requesting work up for syncopal episode x5 days ago with gap in memory. Patient sent to ED, no neuro deficits at this time.

## 2019-12-16 NOTE — ED Triage Notes (Signed)
Was at work on last Tuesday, had an episode of diaphoresis and shaking-- was seen by EMS - but did n't come to hospital-- now employer wants her to get checked before returning to work.  Hx of stroke with some aphasia -- states when this happened she had increased aphasia- Pt has equal hand grasps, no drift, ambulates without difficulty, states speech is normal

## 2019-12-16 NOTE — Discharge Instructions (Signed)
You were seen in the emergency department today with episode of nearly passing out and speech disturbance.  Your lab tests and imaging did not show a clear reason for the symptoms.  You do not have evidence of a new stroke.  Please call your primary care doctor tomorrow morning to schedule the next available follow-up appointment.  Return to the emergency department with any new or suddenly worsening symptoms.

## 2019-12-16 NOTE — ED Notes (Signed)
Pt returned from MRI °

## 2019-12-16 NOTE — Telephone Encounter (Signed)
Patient asked for a referral to [facility] for the stroke.

## 2019-12-16 NOTE — ED Notes (Signed)
Patient verbalizes understanding of discharge instructions. Opportunity for questioning and answers were provided. Armband removed by staff, pt discharged from ED ambulatory to home.  

## 2019-12-16 NOTE — ED Notes (Signed)
Rounded on pt in [place] while collecting re-assess vitals. Pt sts "is there someone that can just give me my results and give me a note to go back to work." Pt currently eating pretzels and drinking mountain dew.

## 2019-12-17 NOTE — Telephone Encounter (Signed)
Patient was given referral in Hospital, patient has an appointment set up already.

## 2019-12-18 ENCOUNTER — Ambulatory Visit: Payer: Medicare Other | Admitting: Neurology

## 2019-12-18 ENCOUNTER — Encounter: Payer: Self-pay | Admitting: Neurology

## 2019-12-18 ENCOUNTER — Other Ambulatory Visit: Payer: Self-pay

## 2019-12-18 VITALS — BP 120/71 | HR 69 | Temp 97.1°F | Ht 65.0 in | Wt 229.5 lb

## 2019-12-18 DIAGNOSIS — I69320 Aphasia following cerebral infarction: Secondary | ICD-10-CM

## 2019-12-18 DIAGNOSIS — I639 Cerebral infarction, unspecified: Secondary | ICD-10-CM | POA: Diagnosis not present

## 2019-12-18 DIAGNOSIS — G4730 Sleep apnea, unspecified: Secondary | ICD-10-CM | POA: Diagnosis not present

## 2019-12-18 DIAGNOSIS — D6861 Antiphospholipid syndrome: Secondary | ICD-10-CM | POA: Diagnosis not present

## 2019-12-18 DIAGNOSIS — I10 Essential (primary) hypertension: Secondary | ICD-10-CM | POA: Diagnosis not present

## 2019-12-18 NOTE — Progress Notes (Signed)
[facility]  PATIENT: Mary [name] DOB: 11/25/1966  REFERRING DOCTOR OR PCP:  Dr. Margarita Rana  SOURCE: patient, notes from ED, imaging and lab reports, CT and MRI images personally reviewed.  _________________________________   HISTORICAL  CHIEF COMPLAINT:  Chief Complaint  Patient presents with  . Referral    Referral for syncope and changes in speech room 13 pt seen last by Dr Erlinda Hong    HISTORY OF PRESENT ILLNESS:  I had the pleasure of seeing your patient, Mary [name], at [facility] for a neurologic consultation regarding her pre-syncope and history of stroke.  She is a 53 year old woman who had an episode of presyncope associated with aphasia 12/16/2019.   She was standing and felt very lightheaded while at work lasting 5-10 minutes. She was standing and sat down and she also noted spinning sensation.   She could hear people but could not respond.  After 5-10 minutes she quickly improved to baseline.    She went home from work.   However, due to the symptoms and wanting to make sure she did not have a new stroke, she went to the ED. At [facility]   Ct showed chronic stroke and she also had MRI and it showed chronic right ACA and left MCA.  She also has some small chronic lacunar on the BG and corona radiata.     PT was therapeutic at 25.7 and INR 2.4 and PTT 59.      She has not had other episodes of lightheadedness.    She did not lose consciousness.       She had a CVA 01/23/2013 (speech difficulty, expressive aphasia due to left parietal CVA - CTA showed an MCA branch occlusion).   Rest of evaluation normal except antiphospholipid.  She also has a history of DVT and is on warfarin.     She had a loop recorder x 2-3 years and had no atrial fibrillation.   Her aphasia greatly improved but she continues to have mild fluency issues.  She never had any symptoms associated with the ACA distribution stroke.  She has OSA and has CPAP but stopped due  to high copay.    She had gastric sleeve procedure and lost 70 pounds.     I personally reviewed the CT and MRI scans of the brain performed on 12/16/2019.   There are chronic large vessel infarctions involving the left parietal lobe (MCA branch) and the anterior right frontal lobe (right ACA).   Additionally, there are scattered lacunar infarctions in chronic microvascular ischemic change.  There were no acute findings.   REVIEW OF SYSTEMS: Constitutional: No fevers, chills, sweats, or change in appetite Eyes: No visual changes, double vision, eye pain Ear, nose and throat: No hearing loss, ear pain, nasal congestion, sore throat Cardiovascular: No chest pain, palpitations Respiratory: No shortness of breath at rest or with exertion.   No wheezes.  She has OSA that is untreated. GastrointestinaI: No nausea, vomiting, diarrhea, abdominal pain, fecal incontinence Genitourinary: No dysuria, urinary retention or frequency.  No nocturia. Musculoskeletal: No neck pain, back pain Integumentary: No rash, pruritus, skin lesions Neurological: as above Psychiatric: No depression at this time.  No anxiety Endocrine: No palpitations, diaphoresis, change in appetite, change in weigh or increased thirst Hematologic/Lymphatic: No anemia, purpura, petechiae. Allergic/Immunologic: No itchy/runny eyes, nasal congestion, recent allergic reactions, rashes  ALLERGIES: No Known Allergies  HOME MEDICATIONS:  Current Outpatient Medications:  .  amLODipine (NORVASC) 10 MG  tablet, Take 1 tablet (10 mg total) by mouth daily., Disp: 30 tablet, Rfl: 6 .  hydrochlorothiazide (HYDRODIURIL) 25 MG tablet, Take 0.5 tablets (12.5 mg total) by mouth daily. (Patient taking differently: Take 12.5 mg by mouth at bedtime. ), Disp: 15 tablet, Rfl: 11 .  metoprolol tartrate (LOPRESSOR) 25 MG tablet, TAKE 25 MG BY MOUTH TWICE DAILY, Disp: 60 tablet, Rfl: 6 .  omeprazole (PRILOSEC) 20 MG capsule, Take 1 capsule (20 mg total)  by mouth daily., Disp: 30 capsule, Rfl: 3 .  warfarin (COUMADIN) 5 MG tablet, TAKE AS DIRECTED BY COUMADIN CLINIC, Disp: 100 tablet, Rfl: 0 .  fluticasone (FLONASE) 50 MCG/ACT nasal spray, Place 2 sprays into both nostrils daily. (Patient not taking: Reported on 12/18/2019), Disp: 16 g, Rfl: 6  PAST MEDICAL HISTORY: Past Medical History:  Diagnosis Date  . Blood transfusion without reported diagnosis    transfusion December 2016  . Clotting disorder (HCC)    left leg  . Diabetes mellitus without complication ([place])   . Hypertension   . Other and unspecified ovarian cysts   . Stroke ([place])   . Vaginal Pap smear, abnormal     PAST SURGICAL HISTORY: Past Surgical History:  Procedure Laterality Date  . COLONOSCOPY WITH PROPOFOL N/A 11/10/2015   Procedure: COLONOSCOPY WITH PROPOFOL;  Surgeon: Lucilla Lame, MD;  Location: [facility] ENDOSCOPY;  Service: Endoscopy;  Laterality: N/A;  . ESOPHAGOGASTRODUODENOSCOPY (EGD) WITH PROPOFOL N/A 11/10/2015   Procedure: ESOPHAGOGASTRODUODENOSCOPY (EGD) WITH PROPOFOL;  Surgeon: Lucilla Lame, MD;  Location: [facility] ENDOSCOPY;  Service: Endoscopy;  Laterality: N/A;  . LAPAROSCOPIC GASTRIC SLEEVE RESECTION  02/17/2016   placement  . LOOP RECORDER IMPLANT N/A 04/25/2014   Procedure: LOOP RECORDER IMPLANT;  Surgeon: Coralyn Mark, MD;  Location: [place] CATH LAB;  Service: Cardiovascular;  Laterality: N/A;  . LOOP RECORDER REMOVAL N/A 02/01/2018   Procedure: LOOP RECORDER REMOVAL;  Surgeon: Deboraha Sprang, MD;  Location: [place] CV LAB;  Service: Cardiovascular;  Laterality: N/A;  . OVARIAN CYST REMOVAL    . TEE WITHOUT CARDIOVERSION N/A 01/25/2013   Procedure: TRANSESOPHAGEAL ECHOCARDIOGRAM (TEE);  Surgeon: Birdie Riddle, MD;  Location: [facility] ENDOSCOPY;  Service: Cardiovascular;  Laterality: N/A;    FAMILY HISTORY: Family History  Problem Relation Age of Onset  . Diabetes Mellitus II Mother   . Diabetes Mother   . Stroke Mother   . Transient ischemic attack Father    . Stroke Father   . Stroke Other   . Diabetes Other   . Diabetes Sister     SOCIAL HISTORY:  Social History   Socioeconomic History  . Marital status: Single    Spouse name: Not on file  . Number of children: 2  . Years of education: 31  . Highest education level: Not on file  Occupational History  . Not on file  Tobacco Use  . Smoking status: Former Smoker    Packs/day: 1.00    Years: 10.00    Pack years: 10.00    Types: Cigarettes    Quit date: 07/14/2006    Years since quitting: 13.4  . Smokeless tobacco: Never Used  . Tobacco comment: quit smoking around 2007  Substance and Sexual Activity  . Alcohol use: No    Alcohol/week: 0.0 standard drinks  . Drug use: No  . Sexual activity: Yes  Other Topics Concern  . Not on file  Social History Narrative   Patient is single with two children.   Patient is right handed.  Patient has 13 yrs of education.   Patient drinks 5 sodas daily.   Social Determinants of Health   Financial Resource Strain:   . Difficulty of Paying Living Expenses: Not on file  Food Insecurity:   . Worried About Charity fundraiser in the Last Year: Not on file  . Ran Out of Food in the Last Year: Not on file  Transportation Needs:   . Lack of Transportation (Medical): Not on file  . Lack of Transportation (Non-Medical): Not on file  Physical Activity:   . Days of Exercise per Week: Not on file  . Minutes of Exercise per Session: Not on file  Stress:   . Feeling of Stress : Not on file  Social Connections:   . Frequency of Communication with Friends and Family: Not on file  . Frequency of Social Gatherings with Friends and Family: Not on file  . Attends Religious Services: Not on file  . Active Member of Clubs or Organizations: Not on file  . Attends Archivist Meetings: Not on file  . Marital Status: Not on file  Intimate Partner Violence:   . Fear of Current or Ex-Partner: Not on file  . Emotionally Abused: Not on file  .  Physically Abused: Not on file  . Sexually Abused: Not on file     PHYSICAL EXAM  Vitals:   12/18/19 1023  BP: 120/71  Pulse: 69  Temp: (!) 97.1 F (36.2 C)  Weight: 229 lb 8 oz (104.1 kg)  Height: 5\' 5"  (1.651 m)    Body mass index is 38.19 kg/m.   General: The patient is well-developed and well-nourished and in no acute distress  HEENT:  Head is [place]/AT.  Sclera are anicteric.  Funduscopic exam shows normal optic discs and retinal vessels.  Neck: No carotid bruits are noted.  The neck is nontender.  Cardiovascular: The heart has a regular rate and rhythm with a normal S1 and S2. There were no murmurs, gallops or rubs.    Skin: Extremities are without rash or  edema.  Musculoskeletal:  Back is nontender  Neurologic Exam  Mental status: The patient is alert and oriented x 3 at the time of the examination. The patient has apparent normal recent and remote memory, with an apparently normal attention span and concentration ability.  Mild word finding difficulty and hesitancy.   Slow simple calculations, some left right responses.  Cranial nerves: Extraocular movements are full. Pupils are equal, round, and reactive to light and accomodation.  Visual fields are full.  Facial symmetry is present. There is good facial sensation to soft touch bilaterally.Facial strength is normal.  Trapezius and sternocleidomastoid strength is normal. No dysarthria is noted.  The tongue is midline, and the patient has symmetric elevation of the soft palate. No obvious hearing deficits are noted.  Motor:  Muscle bulk is normal.   Tone is normal. Strength is  5 / 5 in all 4 extremities.   Sensory: Sensory testing is intact to pinprick, soft touch and vibration sensation in all 4 extremities.  Coordination: Cerebellar testing reveals good finger-nose-finger and heel-to-shin bilaterally.  Gait and station: Station is normal.   Gait is normal. Tandem gait is mildly wide.  Romberg is negative.    Reflexes: Deep tendon reflexes are symmetric and normal bilaterally.     DIAGNOSTIC DATA (LABS, IMAGING, TESTING) - I reviewed patient records, labs, notes, testing and imaging myself where available.  Lab Results  Component Value Date  WBC 4.0 12/16/2019   HGB 11.9 (L) 12/16/2019   HCT 35.0 (L) 12/16/2019   MCV 74.2 (L) 12/16/2019   PLT 262 12/16/2019      Component Value Date/Time   NA 140 12/16/2019 1108   NA 143 01/29/2019 1130   NA 140 10/03/2014 1004   K 3.0 (L) 12/16/2019 1108   K 3.6 10/03/2014 1004   CL 101 12/16/2019 1108   CL 106 10/03/2014 1004   CO2 28 12/16/2019 1042   CO2 29 10/03/2014 1004   GLUCOSE 86 12/16/2019 1108   GLUCOSE 100 (H) 10/03/2014 1004   BUN 17 12/16/2019 1108   BUN 15 01/29/2019 1130   BUN 9 10/03/2014 1004   CREATININE 1.10 (H) 12/16/2019 1108   CREATININE 0.75 01/30/2017 1135   CALCIUM 8.6 (L) 12/16/2019 1042   CALCIUM 8.2 (L) 10/03/2014 1004   PROT 7.6 12/16/2019 1042   PROT 7.4 04/26/2018 1454   PROT 8.3 (H) 10/03/2014 1004   ALBUMIN 3.3 (L) 12/16/2019 1042   ALBUMIN 4.0 04/26/2018 1454   ALBUMIN 3.1 (L) 10/03/2014 1004   AST 20 12/16/2019 1042   AST 28 10/03/2014 1004   ALT 17 12/16/2019 1042   ALT 25 10/03/2014 1004   ALKPHOS 59 12/16/2019 1042   ALKPHOS 76 10/03/2014 1004   BILITOT 0.6 12/16/2019 1042   BILITOT <0.2 04/26/2018 1454   BILITOT 0.3 10/03/2014 1004   GFRNONAA 51 (L) 12/16/2019 1042   GFRNONAA >89 01/30/2017 1135   GFRAA 59 (L) 12/16/2019 1042   GFRAA >89 01/30/2017 1135   Lab Results  Component Value Date   CHOL 106 09/17/2019   HDL 50 09/17/2019   LDLCALC 34 09/17/2019   TRIG 112 09/17/2019   CHOLHDL 2.1 09/17/2019   Lab Results  Component Value Date   HGBA1C 6.1 (H) 01/29/2019   Lab Results  Component Value Date   VITAMINB12 240 11/09/2015   Lab Results  Component Value Date   TSH 1.16 05/09/2016       ASSESSMENT AND PLAN  Antiphospholipid syndrome ([place]) - Plan: MR ANGIO HEAD  WO CONTRAST, MR ANGIO NECK W WO CONTRAST  Cryptogenic stroke ([place]) - Plan: MR ANGIO HEAD WO CONTRAST, MR ANGIO NECK W WO CONTRAST  Aphasia as late effect of cerebrovascular accident - Plan: MR ANGIO HEAD WO CONTRAST, MR ANGIO NECK W WO CONTRAST  Sleep apnea, unspecified type  Essential hypertension   In summary, Ms. Cortorreal is a 53 year old woman with a history of 2 large vessel strokes and lacunar infarctions who had an episode of lightheadedness, vertigo and worsening of her aphasia lasting 5 to 10 minutes.  The etiology is uncertain.  This could certainly represent a transient ischemic attack and we will check an MR angiogram of the neck and head arteries.  She wore a loop recorder for a couple years and there was no evidence of atrial fibrillation so I think an arrhythmia would be less likely.  Orthostatic hypotension might have cause similar symptoms especially given her known strokes.    Considering the extent of the chronic ischemic changes in the brain she is doing well.  Her mild disfluency and difficulties with calculations and right/left determination issues are all consistent with a left parietal lobe stroke.  We will let her know the results of the imaging studies and follow-up as needed based on new or worsening symptoms and results.  Thank you for asking me to see Ms. Rupert.  Please let me know if I can be of  further assistance with her or other patients in the future.  Nawaf Strange A. Felecia Shelling, MD, [facility] [id number], [id number] AM Certified in Neurology, Clinical Neurophysiology, Sleep Medicine and Neuroimaging  [facility] [address] [place], [place] [address] [phone]

## 2019-12-23 ENCOUNTER — Ambulatory Visit (INDEPENDENT_AMBULATORY_CARE_PROVIDER_SITE_OTHER): Payer: Medicare Other

## 2019-12-23 ENCOUNTER — Other Ambulatory Visit: Payer: Self-pay

## 2019-12-23 DIAGNOSIS — Z5181 Encounter for therapeutic drug level monitoring: Secondary | ICD-10-CM

## 2019-12-23 DIAGNOSIS — I639 Cerebral infarction, unspecified: Secondary | ICD-10-CM | POA: Diagnosis not present

## 2019-12-23 DIAGNOSIS — Z8673 Personal history of transient ischemic attack (TIA), and cerebral infarction without residual deficits: Secondary | ICD-10-CM

## 2019-12-23 LAB — POCT INR: INR: 2.3 (ref 2.0–3.0)

## 2019-12-23 NOTE — Patient Instructions (Signed)
Please take 1 whole tablet tonight, then continue dosage of 1 pill every day except 1/2 tablets on Mondays & Fridays.  Recheck in 6 weeks.

## 2019-12-24 ENCOUNTER — Other Ambulatory Visit: Payer: Self-pay

## 2019-12-24 NOTE — Telephone Encounter (Signed)
It appears pt was a No show for previous appt for echo. I replaced order for today as she is on the schedule.  U/s tech made aware.

## 2019-12-24 NOTE — Addendum Note (Signed)
Addended by: Verlon Au on: 12/24/2019 09:09 AM   Modules accepted: Orders

## 2019-12-24 NOTE — Patient Outreach (Signed)
[facility]) Care Management  12/24/2019  Mary [name] [birth year] [id number]   Medication Adherence call to [name] Telephone call to Patient regarding Medication Adherence unable to reach patient. [name] is showing past due on Atorvastatin 40 mg under [place].   [facility] Management Direct Dial [phone]  Fax [phone] [email or web address]

## 2019-12-26 ENCOUNTER — Ambulatory Visit (INDEPENDENT_AMBULATORY_CARE_PROVIDER_SITE_OTHER): Payer: Medicare Other

## 2019-12-26 ENCOUNTER — Other Ambulatory Visit: Payer: Self-pay

## 2019-12-26 DIAGNOSIS — R0600 Dyspnea, unspecified: Secondary | ICD-10-CM

## 2019-12-30 ENCOUNTER — Telehealth: Payer: Self-pay

## 2019-12-30 NOTE — Telephone Encounter (Signed)
-----   Message from Rise Mu, PA-C sent at 12/27/2019  4:10 PM EST ----- Echo showed normal pump function with normal wall motion. Echo was unable to assess pressure along the pulmonary artery. IVC was not dilated, which suggests she is not significantly volume overloaded. Follow up as planned later this month.

## 2019-12-30 NOTE — Telephone Encounter (Signed)
Call to patient to discuss echo results. No further questions or concerns.   Confirmed upcoming appt with Dr. Rockey Situ later this month.   Advised pt to call for any further questions or concerns.

## 2019-12-31 ENCOUNTER — Ambulatory Visit
Admission: RE | Admit: 2019-12-31 | Discharge: 2019-12-31 | Disposition: A | Discharge status: Home / Self Care | Payer: Medicare Other | Source: Ambulatory Visit | Attending: Neurology | Admitting: Neurology

## 2019-12-31 ENCOUNTER — Other Ambulatory Visit: Payer: Self-pay

## 2019-12-31 DIAGNOSIS — I639 Cerebral infarction, unspecified: Secondary | ICD-10-CM | POA: Diagnosis not present

## 2019-12-31 DIAGNOSIS — D6861 Antiphospholipid syndrome: Secondary | ICD-10-CM

## 2019-12-31 DIAGNOSIS — I69320 Aphasia following cerebral infarction: Secondary | ICD-10-CM

## 2019-12-31 MED ORDER — GADOBENATE DIMEGLUMINE 529 MG/ML IV SOLN
20.0000 mL | Freq: Once | INTRAVENOUS | Status: AC | PRN
  Administered 2019-12-31: 10:00:00 20 mL via INTRAVENOUS

## 2020-01-02 ENCOUNTER — Telehealth: Payer: Self-pay | Admitting: *Deleted

## 2020-01-02 NOTE — Telephone Encounter (Signed)
Tried calling pt about results at [phone](preferred #). VM not set up, could not LVM

## 2020-01-02 NOTE — Telephone Encounter (Signed)
-----   Message from Britt Bottom, MD sent at 01/02/2020 12:25 PM EST ----- Please let the patient know that the MR angiograms were ok  --- just a little narrowing of one artery (was also mildly narrowed in 2014) but nothing serious

## 2020-01-06 NOTE — Telephone Encounter (Signed)
Tried pt at [phone] again, VM not set up to leave message.  Tried [phone]. I was able to discuss results per Dr. Felecia Shelling note with pt. Instructed pt to call back if she future concerns.

## 2020-01-12 NOTE — Progress Notes (Deleted)
Cardiology Office Note  Date:  01/12/2020   ID:  Mary [name], DOB Jun 16, 1966, MRN [id number]  PCP:  [name], MD   No chief complaint on file.   HPI:  Mary [name] is a 54 y.o. female   bariatric surgery. History of stroke Antiphospholipid syndrome,  on warfarin OSA , not on CPAP Previous loop monitor placed,  previous stroke, she has some difficulty with walking.  Who presents for routine follow-up of her stroke  Implantable loop recorder placed, performed for cryptogenic stroke Declined explant in the past, very nervous Previous records reviewed Ten episodes of 3+ sec pauses noted Unable to afford her CPAP  No new complaints on today's visit  Patient denies PND, orthopnea, edema. No loss of consciousness, no palpitations, no abdominal pain.  No TIA symptoms  Lab work reviewed with her Last cholesterol 2 years ago 120s.  Unclear if she was on a statin at that time HBA1C 5.7  Previous testing reviewed with her Negative stress test 10/20/2016  EKG personally reviewed by myself on todays visit Shows normal sinus rhythm rate 57 bpm no significant ST or T wave changes   PMH:   has a past medical history of Blood transfusion without reported diagnosis, Clotting disorder ([place]), Diabetes mellitus without complication ([place]), Hypertension, Other and unspecified ovarian cysts, Stroke ([place]), and Vaginal Pap smear, abnormal.  PSH:    Past Surgical History:  Procedure Laterality Date  . COLONOSCOPY WITH PROPOFOL N/A 11/10/2015   Procedure: COLONOSCOPY WITH PROPOFOL;  Surgeon: Lucilla Lame, MD;  Location: [facility] ENDOSCOPY;  Service: Endoscopy;  Laterality: N/A;  . ESOPHAGOGASTRODUODENOSCOPY (EGD) WITH PROPOFOL N/A 11/10/2015   Procedure: ESOPHAGOGASTRODUODENOSCOPY (EGD) WITH PROPOFOL;  Surgeon: Lucilla Lame, MD;  Location: [facility] ENDOSCOPY;  Service: Endoscopy;  Laterality: N/A;  . LAPAROSCOPIC GASTRIC SLEEVE RESECTION  02/17/2016   placement  . LOOP RECORDER IMPLANT N/A  04/25/2014   Procedure: LOOP RECORDER IMPLANT;  Surgeon: Coralyn Mark, MD;  Location: [place] CATH LAB;  Service: Cardiovascular;  Laterality: N/A;  . LOOP RECORDER REMOVAL N/A 02/01/2018   Procedure: LOOP RECORDER REMOVAL;  Surgeon: Deboraha Sprang, MD;  Location: [place] CV LAB;  Service: Cardiovascular;  Laterality: N/A;  . OVARIAN CYST REMOVAL    . TEE WITHOUT CARDIOVERSION N/A 01/25/2013   Procedure: TRANSESOPHAGEAL ECHOCARDIOGRAM (TEE);  Surgeon: Birdie Riddle, MD;  Location: [facility] ENDOSCOPY;  Service: Cardiovascular;  Laterality: N/A;    Current Outpatient Medications  Medication Sig Dispense Refill  . amLODipine (NORVASC) 10 MG tablet Take 1 tablet (10 mg total) by mouth daily. 30 tablet 6  . fluticasone (FLONASE) 50 MCG/ACT nasal spray Place 2 sprays into both nostrils daily. (Patient not taking: Reported on 12/18/2019) 16 g 6  . hydrochlorothiazide (HYDRODIURIL) 25 MG tablet Take 0.5 tablets (12.5 mg total) by mouth daily. (Patient taking differently: Take 12.5 mg by mouth at bedtime. ) 15 tablet 11  . metoprolol tartrate (LOPRESSOR) 25 MG tablet TAKE 25 MG BY MOUTH TWICE DAILY 60 tablet 6  . omeprazole (PRILOSEC) 20 MG capsule Take 1 capsule (20 mg total) by mouth daily. 30 capsule 3  . warfarin (COUMADIN) 5 MG tablet TAKE AS DIRECTED BY COUMADIN CLINIC 100 tablet 0   No current facility-administered medications for this visit.     Allergies:   Patient has no known allergies.   Social History:  The patient  reports that she quit smoking about 13 years ago. Her smoking use included cigarettes. She has a 10.00 pack-year smoking history.  She has never used smokeless tobacco. She reports that she does not drink alcohol or use drugs.   Family History:   family history includes Diabetes in her mother, sister, and another family member; Diabetes Mellitus II in her mother; Stroke in her father, mother, and another family member; Transient ischemic attack in her father.    Review of  Systems: Review of Systems  Constitutional: Negative.   Respiratory: Negative.   Cardiovascular: Negative.   Gastrointestinal: Negative.   Musculoskeletal: Negative.   Neurological: Negative.   Psychiatric/Behavioral: Negative.   All other systems reviewed and are negative.    PHYSICAL EXAM: VS:  There were no vitals taken for this visit. , BMI There is no height or weight on file to calculate BMI. GEN: Well nourished, well developed, in no acute distress  HEENT: normal  Neck: no JVD, carotid bruits, or masses Cardiac: RRR; no murmurs, rubs, or gallops,no edema  Respiratory:  clear to auscultation bilaterally, normal work of breathing GI: soft, nontender, nondistended, + BS MS: no deformity or atrophy  Skin: warm and dry, no rash Neuro:  Strength and sensation are intact Psych: euthymic mood, full affect    Recent Labs: 12/16/2019: ALT 17; BUN 17; Creatinine, Ser 1.10; Hemoglobin 11.9; Platelets 262; Potassium 3.0; Sodium 140    Lipid Panel Lab Results  Component Value Date   CHOL 106 09/17/2019   HDL 50 09/17/2019   LDLCALC 34 09/17/2019   TRIG 112 09/17/2019      Wt Readings from Last 3 Encounters:  12/18/19 229 lb 8 oz (104.1 kg)  12/16/19 226 lb (102.5 kg)  09/02/19 231 lb 12 oz (105.1 kg)       ASSESSMENT AND PLAN:  Cryptogenic stroke (HCC) On warfarin for antiphospholipid syndrome No further TIA or stroke symptoms  Essential hypertension - Plan: EKG 12-Lead Blood pressure is well controlled on today's visit. No changes made to the medications.  Antiphospholipid syndrome (HCC) On warfarin as above  Sleep apnea, unspecified type Unable to afford her CPAP Having pauses as recorded on loop monitor Asymptomatic  Loop monitor explant Long discussion with her concerning need to explant the loop She is anxious, walked her through the procedure She is willing to have this done in [place] We will discuss with Dr. Caryl Comes  Disposition:   F/U as  needed   Total encounter time more than 25 minutes  Greater than 50% was spent in counseling and coordination of care with the patient   No orders of the defined types were placed in this encounter.    Signed, Esmond Plants, M.D., Ph.D. 01/12/2020  [place], [place]

## 2020-01-13 ENCOUNTER — Ambulatory Visit: Payer: Medicare Other | Admitting: Cardiovascular Disease

## 2020-01-15 ENCOUNTER — Encounter: Payer: Self-pay | Admitting: Cardiovascular Disease

## 2020-01-17 ENCOUNTER — Other Ambulatory Visit: Payer: Medicare Other

## 2020-02-03 ENCOUNTER — Ambulatory Visit (INDEPENDENT_AMBULATORY_CARE_PROVIDER_SITE_OTHER): Payer: Medicare Other

## 2020-02-03 ENCOUNTER — Other Ambulatory Visit: Payer: Self-pay

## 2020-02-03 DIAGNOSIS — I639 Cerebral infarction, unspecified: Secondary | ICD-10-CM

## 2020-02-03 DIAGNOSIS — Z5181 Encounter for therapeutic drug level monitoring: Secondary | ICD-10-CM

## 2020-02-03 DIAGNOSIS — Z8673 Personal history of transient ischemic attack (TIA), and cerebral infarction without residual deficits: Secondary | ICD-10-CM

## 2020-02-03 LAB — POCT INR: INR: 3.3 — AB (ref 2.0–3.0)

## 2020-02-03 NOTE — Patient Instructions (Signed)
Please continue dosage of 1 pill every day except 1/2 tablets on Mondays & Fridays.  Recheck in 6 weeks.

## 2020-03-07 ENCOUNTER — Other Ambulatory Visit: Payer: Self-pay | Admitting: Family Medicine

## 2020-03-07 DIAGNOSIS — I10 Essential (primary) hypertension: Secondary | ICD-10-CM

## 2020-03-10 ENCOUNTER — Other Ambulatory Visit: Payer: Self-pay | Admitting: Family Medicine

## 2020-03-10 DIAGNOSIS — I10 Essential (primary) hypertension: Secondary | ICD-10-CM

## 2020-03-16 ENCOUNTER — Ambulatory Visit (INDEPENDENT_AMBULATORY_CARE_PROVIDER_SITE_OTHER): Payer: Managed Care, Other (non HMO)

## 2020-03-16 ENCOUNTER — Other Ambulatory Visit: Payer: Self-pay

## 2020-03-16 DIAGNOSIS — Z8673 Personal history of transient ischemic attack (TIA), and cerebral infarction without residual deficits: Secondary | ICD-10-CM | POA: Diagnosis not present

## 2020-03-16 DIAGNOSIS — Z5181 Encounter for therapeutic drug level monitoring: Secondary | ICD-10-CM | POA: Diagnosis not present

## 2020-03-16 DIAGNOSIS — I639 Cerebral infarction, unspecified: Secondary | ICD-10-CM

## 2020-03-16 LAB — POCT INR: INR: 3.3 — AB (ref 2.0–3.0)

## 2020-03-16 NOTE — Patient Instructions (Signed)
Please continue warfarin dosage of 1 pill (5 mg) every day except 1/2 tablets (2.5 mg) on Mondays & Fridays.  Recheck in 6 weeks.

## 2020-03-25 ENCOUNTER — Other Ambulatory Visit: Payer: Self-pay

## 2020-03-25 ENCOUNTER — Encounter: Payer: Self-pay | Admitting: Family Medicine

## 2020-03-25 ENCOUNTER — Ambulatory Visit: Payer: Medicare Other | Attending: Family Medicine | Admitting: Family Medicine

## 2020-03-25 VITALS — BP 144/77 | HR 57 | Ht 65.0 in | Wt 230.0 lb

## 2020-03-25 DIAGNOSIS — E1159 Type 2 diabetes mellitus with other circulatory complications: Secondary | ICD-10-CM

## 2020-03-25 DIAGNOSIS — D6861 Antiphospholipid syndrome: Secondary | ICD-10-CM

## 2020-03-25 DIAGNOSIS — R87619 Unspecified abnormal cytological findings in specimens from cervix uteri: Secondary | ICD-10-CM

## 2020-03-25 DIAGNOSIS — I152 Hypertension secondary to endocrine disorders: Secondary | ICD-10-CM

## 2020-03-25 DIAGNOSIS — I10 Essential (primary) hypertension: Secondary | ICD-10-CM

## 2020-03-25 DIAGNOSIS — Z6838 Body mass index (BMI) 38.0-38.9, adult: Secondary | ICD-10-CM

## 2020-03-25 LAB — POCT GLYCOSYLATED HEMOGLOBIN (HGB A1C): HbA1c, POC (controlled diabetic range): 5.8 % (ref 0.0–7.0)

## 2020-03-25 MED ORDER — METOPROLOL TARTRATE 25 MG PO TABS: ORAL_TABLET | ORAL | 6 refills | Status: DC

## 2020-03-25 MED ORDER — ATORVASTATIN CALCIUM 40 MG PO TABS: 40.0000 mg | ORAL_TABLET | Freq: Every day | ORAL | 6 refills | Status: DC

## 2020-03-25 MED ORDER — HYDROCHLOROTHIAZIDE 12.5 MG PO TABS: 12.5000 mg | ORAL_TABLET | Freq: Every day | ORAL | 6 refills | Status: DC

## 2020-03-25 NOTE — Progress Notes (Signed)
Subjective:  Patient ID: Mary [name], female    DOB: [birth year]  Age: 54 y.o. MRN: [id number]  CC: Hypertension and Diabetes   HPI Mary [name] is a 54 year old female with history of type 2 diabetes mellitus (diet controlled with A1c of 5.8) antiphospholipid syndrome (on chronic anticoagulation with Coumadin, followed by the Coumadin clinic), previous stroke with no residual deficits, hypertension here for a follow up visit.  With regards to her Diabetes she is on diet control. Not up to date on eye exams; she will be scheduling one soon Her major form of exercise is at her job at [employer] but she does not do engage in any exercise outside of work. She endorses compliance with her medications. She had Atypical glandular cells on her last PAP smear in 09/2018; referred to GYN but no additional work up performed. She has no additional concerns today.  Past Medical History:  Diagnosis Date  . Blood transfusion without reported diagnosis    transfusion December 2016  . Clotting disorder (HCC)    left leg  . Diabetes mellitus without complication ([place])   . Hypertension   . Other and unspecified ovarian cysts   . Stroke ([place])   . Vaginal Pap smear, abnormal     Past Surgical History:  Procedure Laterality Date  . COLONOSCOPY WITH PROPOFOL N/A 11/10/2015   Procedure: COLONOSCOPY WITH PROPOFOL;  Surgeon: Lucilla Lame, MD;  Location: [facility] ENDOSCOPY;  Service: Endoscopy;  Laterality: N/A;  . ESOPHAGOGASTRODUODENOSCOPY (EGD) WITH PROPOFOL N/A 11/10/2015   Procedure: ESOPHAGOGASTRODUODENOSCOPY (EGD) WITH PROPOFOL;  Surgeon: Lucilla Lame, MD;  Location: [facility] ENDOSCOPY;  Service: Endoscopy;  Laterality: N/A;  . LAPAROSCOPIC GASTRIC SLEEVE RESECTION  02/17/2016   placement  . LOOP RECORDER IMPLANT N/A 04/25/2014   Procedure: LOOP RECORDER IMPLANT;  Surgeon: Coralyn Mark, MD;  Location: [place] CATH LAB;  Service: Cardiovascular;  Laterality: N/A;  . LOOP RECORDER REMOVAL N/A  02/01/2018   Procedure: LOOP RECORDER REMOVAL;  Surgeon: Deboraha Sprang, MD;  Location: [place] CV LAB;  Service: Cardiovascular;  Laterality: N/A;  . OVARIAN CYST REMOVAL    . TEE WITHOUT CARDIOVERSION N/A 01/25/2013   Procedure: TRANSESOPHAGEAL ECHOCARDIOGRAM (TEE);  Surgeon: Birdie Riddle, MD;  Location: [facility] ENDOSCOPY;  Service: Cardiovascular;  Laterality: N/A;    Family History  Problem Relation Age of Onset  . Diabetes Mellitus II Mother   . Diabetes Mother   . Stroke Mother   . Transient ischemic attack Father   . Stroke Father   . Stroke Other   . Diabetes Other   . Diabetes Sister     No Known Allergies  Outpatient Medications Prior to Visit  Medication Sig Dispense Refill  . amLODipine (NORVASC) 10 MG tablet Take 1 tablet (10 mg total) by mouth daily. 30 tablet 6  . hydrochlorothiazide (HYDRODIURIL) 25 MG tablet Take 0.5 tablets (12.5 mg total) by mouth daily. (Patient taking differently: Take 12.5 mg by mouth at bedtime. ) 15 tablet 11  . metoprolol tartrate (LOPRESSOR) 25 MG tablet Take 1 tablet by mouth twice daily. Must have office visit for refills 60 tablet 2  . warfarin (COUMADIN) 5 MG tablet TAKE AS DIRECTED BY COUMADIN CLINIC 100 tablet 0  . fluticasone (FLONASE) 50 MCG/ACT nasal spray Place 2 sprays into both nostrils daily. (Patient not taking: Reported on 12/18/2019) 16 g 6  . omeprazole (PRILOSEC) 20 MG capsule Take 1 capsule (20 mg total) by mouth daily. (Patient not  taking: Reported on 03/25/2020) 30 capsule 3   No facility-administered medications prior to visit.     ROS Review of Systems  Constitutional: Negative for activity change, appetite change and fatigue.  HENT: Negative for congestion, sinus pressure and sore throat.   Eyes: Negative for visual disturbance.  Respiratory: Negative for cough, chest tightness, shortness of breath and wheezing.   Cardiovascular: Negative for chest pain and palpitations.  Gastrointestinal: Negative for  abdominal distention, abdominal pain and constipation.  Endocrine: Negative for polydipsia.  Genitourinary: Negative for dysuria and frequency.  Musculoskeletal: Negative for arthralgias and back pain.  Skin: Negative for rash.  Neurological: Negative for tremors, light-headedness and numbness.  Hematological: Does not bruise/bleed easily.  Psychiatric/Behavioral: Negative for agitation and behavioral problems.    Objective:  BP (!) 144/77   Pulse (!) 57   Ht _0  (1.651 m)   Wt 230 lb (104.3 kg)   SpO2 98%   BMI 38.27 kg/m   BP/Weight 03/25/2020 12/18/2019 35/00/9381  Systolic BP 829 937 169  Diastolic BP 77 71 96  Wt. (Lbs) 230 229.5 226  BMI 38.27 38.19 37.61      Physical Exam Constitutional:      Appearance: She is well-developed.  Neck:     Vascular: No JVD.  Cardiovascular:     Rate and Rhythm: Normal rate.     Heart sounds: Normal heart sounds. No murmur.  Pulmonary:     Effort: Pulmonary effort is normal.     Breath sounds: Normal breath sounds. No wheezing or rales.  Chest:     Chest wall: No tenderness.  Abdominal:     General: Bowel sounds are normal. There is no distension.     Palpations: Abdomen is soft. There is no mass.     Tenderness: There is no abdominal tenderness.  Musculoskeletal:        General: Normal range of motion.     Right lower leg: No edema.     Left lower leg: No edema.  Neurological:     Mental Status: She is alert and oriented to person, place, and time.  Psychiatric:        Mood and Affect: Mood normal.     CMP Latest Ref Rng & Units 12/16/2019 12/16/2019 09/17/2019  Glucose 70 - 99 mg/dL 86 90 79  BUN 6 - 20 mg/dL _1 Creatinine 0.44 - 1.00 mg/dL 1.10(H) 1.20(H) 0.99  Sodium 135 - 145 mmol/L 140 140 140  Potassium 3.5 - 5.1 mmol/L 3.0(L) 3.0(L) 3.2(L)  Chloride 98 - 111 mmol/L 101 102 105  CO2 22 - 32 mmol/L - 28 27  Calcium 8.9 - 10.3 mg/dL - 8.6(L) 8.7(L)  Total Protein 6.5 - 8.1 g/dL - 7.6 7.8  Total  Bilirubin 0.3 - 1.2 mg/dL - 0.6 0.6  Alkaline Phos 38 - 126 U/L - 59 67  AST 15 - 41 U/L - 20 36  ALT 0 - 44 U/L - 17 26    Lipid Panel     Component Value Date/Time   CHOL 106 09/17/2019 0856   CHOL 152 04/26/2018 1454   TRIG 112 09/17/2019 0856   HDL 50 09/17/2019 0856   HDL 43 04/26/2018 1454   CHOLHDL 2.1 09/17/2019 0856   VLDL 22 09/17/2019 0856   LDLCALC 34 09/17/2019 0856   LDLCALC 70 04/26/2018 1454    CBC    Component Value Date/Time   WBC 4.0 12/16/2019 1042   RBC 4.72 12/16/2019 1042  HGB 11.9 (L) 12/16/2019 1108   HGB 11.1 04/26/2018 1454   HGB 10.0 (L) 04/17/2015 0948   HCT 35.0 (L) 12/16/2019 1108   HCT 33.6 (L) 04/26/2018 1454   HCT 32.1 (L) 04/17/2015 0948   PLT 262 12/16/2019 1042   PLT 294 04/26/2018 1454   MCV 74.2 (L) 12/16/2019 1042   MCV 73 (L) 04/26/2018 1454   MCV 66.0 (L) 04/17/2015 0948   MCH 23.3 (L) 12/16/2019 1042   MCHC 31.4 12/16/2019 1042   RDW 16.2 (H) 12/16/2019 1042   RDW 15.6 (H) 04/26/2018 1454   RDW 18.2 (H) 04/17/2015 0948   LYMPHSABS 1.7 12/16/2019 1042   LYMPHSABS 1.7 04/26/2018 1454   LYMPHSABS 1.8 04/17/2015 0948   MONOABS 0.3 12/16/2019 1042   MONOABS 0.3 04/17/2015 0948   EOSABS 0.2 12/16/2019 1042   EOSABS 0.1 04/26/2018 1454   EOSABS 0.1 10/03/2014 1004   BASOSABS 0.1 12/16/2019 1042   BASOSABS 0.1 04/26/2018 1454   BASOSABS 0.1 04/17/2015 0948    Lab Results  Component Value Date   HGBA1C 5.8 03/25/2020    Assessment & Plan:  1. Type 2 diabetes mellitus with other circulatory complication, without long-term current use of insulin (HCC) Diet controlled with A1c of 5.8; goal is <7.0 Counseled on Diabetic diet, my plate method, 615 minutes of moderate intensity exercise/week Blood sugar logs with fasting goals of 80-120 mg/dl, random of less than 180 and in the event of sugars less than 60 mg/dl or greater than 400 mg/dl encouraged to notify the clinic. Advised on the need for annual eye exams, annual foot  exams, Pneumonia vaccine. - POCT glycosylated hemoglobin (Hb A1C) - Microalbumin / creatinine urine ratio - atorvastatin (LIPITOR) 40 MG tablet; Take 1 tablet (40 mg total) by mouth daily.  Dispense: 90 tablet; Refill: 6 - CMP14+EGFR - Lipid panel  2. Hypertension complicating diabetes ([place]) Slightly elevated No regimen change today as she has been out of HCTZ Counseled on blood pressure goal of less than 130/80, low-sodium, DASH diet, medication compliance, 150 minutes of moderate intensity exercise per week. Discussed medication compliance, adverse effects. - hydrochlorothiazide (HYDRODIURIL) 12.5 MG tablet; Take 1 tablet (12.5 mg total) by mouth daily.  Dispense: 60 tablet; Refill: 6 - metoprolol tartrate (LOPRESSOR) 25 MG tablet; Take 1 tablet by mouth twice daily.  Dispense: 60 tablet; Refill: 6  3. Class 2 severe obesity due to excess calories with serious comorbidity and body mass index (BMI) of 38.0 to 38.9 in adult [facility]) Discussed reducing portion sizes, incorporating more exercise into her regular schedule  4. Atypical cervical glandular cells Referred to GYN in the past Repeat PAP smear at next visit  5. Antiphospholipid syndrome (HCC) H/o CVA On chronic anticoagulation managed by the Coumadin [place] Maintenance: at next visit No orders of the defined types were placed in this encounter.   Follow-up: Return in about 1 month (around 04/24/2020) for medicare wellness exam.        [name], MD, FAAFP. [facility] [place], [place]   03/25/2020, 10:01 AM

## 2020-03-26 ENCOUNTER — Other Ambulatory Visit: Payer: Self-pay | Admitting: Family Medicine

## 2020-03-26 LAB — LIPID PANEL
Chol/HDL Ratio: 3.4 ratio (ref 0.0–4.4)
Cholesterol, Total: 148 mg/dL (ref 100–199)
HDL: 43 mg/dL (ref >39)
LDL Chol Calc (NIH): 77 mg/dL (ref 0–99)
Triglycerides: 162 mg/dL — ABNORMAL HIGH (ref 0–149)
VLDL Cholesterol Cal: 28 mg/dL (ref 5–40)

## 2020-03-26 LAB — CMP14+EGFR
ALT: 15 IU/L (ref 0–32)
AST: 23 IU/L (ref 0–40)
Albumin/Globulin Ratio: 1.1 — ABNORMAL LOW (ref 1.2–2.2)
Albumin: 3.8 g/dL (ref 3.8–4.9)
Alkaline Phosphatase: 77 IU/L (ref 39–117)
BUN/Creatinine Ratio: 13 (ref 9–23)
BUN: 14 mg/dL (ref 6–24)
Bilirubin Total: 0.3 mg/dL (ref 0.0–1.2)
CO2: 24 mmol/L (ref 20–29)
Calcium: 8.9 mg/dL (ref 8.7–10.2)
Chloride: 104 mmol/L (ref 96–106)
Creatinine, Ser: 1.09 mg/dL — ABNORMAL HIGH (ref 0.57–1.00)
GFR calc Af Amer: 67 mL/min/{1.73_m2} (ref >59)
GFR calc non Af Amer: 58 mL/min/{1.73_m2} — ABNORMAL LOW (ref >59)
Globulin, Total: 3.5 g/dL (ref 1.5–4.5)
Glucose: 90 mg/dL (ref 65–99)
Potassium: 3.3 mmol/L — ABNORMAL LOW (ref 3.5–5.2)
Sodium: 143 mmol/L (ref 134–144)
Total Protein: 7.3 g/dL (ref 6.0–8.5)

## 2020-03-26 LAB — MICROALBUMIN / CREATININE URINE RATIO
Creatinine, Urine: 77.7 mg/dL
Microalb/Creat Ratio: 9 mg/g creat (ref 0–29)
Microalbumin, Urine: 6.9 ug/mL

## 2020-03-26 MED ORDER — POTASSIUM CHLORIDE ER 10 MEQ PO TBCR: 10.0000 meq | EXTENDED_RELEASE_TABLET | Freq: Every day | ORAL | 3 refills | Status: DC

## 2020-04-12 ENCOUNTER — Other Ambulatory Visit: Payer: Self-pay | Admitting: Internal Medicine

## 2020-04-27 ENCOUNTER — Other Ambulatory Visit: Payer: Self-pay

## 2020-04-27 ENCOUNTER — Ambulatory Visit (INDEPENDENT_AMBULATORY_CARE_PROVIDER_SITE_OTHER): Payer: Managed Care, Other (non HMO)

## 2020-04-27 ENCOUNTER — Encounter: Payer: Self-pay | Admitting: Family Medicine

## 2020-04-27 ENCOUNTER — Ambulatory Visit (HOSPITAL_BASED_OUTPATIENT_CLINIC_OR_DEPARTMENT_OTHER): Payer: Managed Care, Other (non HMO) | Admitting: Family Medicine

## 2020-04-27 ENCOUNTER — Other Ambulatory Visit (HOSPITAL_COMMUNITY)
Admission: RE | Admit: 2020-04-27 | Discharge: 2020-04-27 | Disposition: A | Discharge status: Home / Self Care | Payer: Managed Care, Other (non HMO) | Source: Ambulatory Visit | Attending: Family Medicine | Admitting: Family Medicine

## 2020-04-27 VITALS — BP 128/79 | HR 66 | Ht 65.0 in | Wt 230.8 lb

## 2020-04-27 DIAGNOSIS — Z124 Encounter for screening for malignant neoplasm of cervix: Secondary | ICD-10-CM | POA: Diagnosis not present

## 2020-04-27 DIAGNOSIS — Z5181 Encounter for therapeutic drug level monitoring: Secondary | ICD-10-CM | POA: Diagnosis not present

## 2020-04-27 DIAGNOSIS — Z0000 Encounter for general adult medical examination without abnormal findings: Secondary | ICD-10-CM | POA: Diagnosis not present

## 2020-04-27 DIAGNOSIS — Z8673 Personal history of transient ischemic attack (TIA), and cerebral infarction without residual deficits: Secondary | ICD-10-CM | POA: Diagnosis not present

## 2020-04-27 DIAGNOSIS — Z1151 Encounter for screening for human papillomavirus (HPV): Secondary | ICD-10-CM | POA: Diagnosis not present

## 2020-04-27 DIAGNOSIS — I639 Cerebral infarction, unspecified: Secondary | ICD-10-CM | POA: Diagnosis not present

## 2020-04-27 DIAGNOSIS — Z1231 Encounter for screening mammogram for malignant neoplasm of breast: Secondary | ICD-10-CM | POA: Diagnosis not present

## 2020-04-27 DIAGNOSIS — Z1159 Encounter for screening for other viral diseases: Secondary | ICD-10-CM | POA: Diagnosis not present

## 2020-04-27 DIAGNOSIS — Z131 Encounter for screening for diabetes mellitus: Secondary | ICD-10-CM | POA: Diagnosis not present

## 2020-04-27 LAB — POCT INR: INR: 2.9 (ref 2.0–3.0)

## 2020-04-27 NOTE — Progress Notes (Signed)
Subjective:   Mary [name] is a 54 y.o. female who presents for Medicare Annual (Subsequent) preventive examination.  Review of Systems:  General: negative for fever, weight loss, appetite change Eyes: no visual symptoms. ENT: no ear symptoms, no sinus tenderness, no nasal congestion or sore throat. Neck: no pain  Respiratory: no wheezing, shortness of breath, cough Cardiovascular: no chest pain, no dyspnea on exertion, no pedal edema, no orthopnea. Gastrointestinal: no abdominal pain, no diarrhea, no constipation Genito-Urinary: no urinary frequency, no dysuria, no polyuria. Hematologic: no bruising Endocrine: no cold or heat intolerance Neurological: no headaches, no seizures, no tremors Musculoskeletal: no joint pains, no joint swelling Skin: no pruritus, no rash. Psychological: no depression, no anxiety,          Objective:     Vitals: BP 128/79   Pulse 66   Ht 5\' 5"  (1.651 m)   Wt 230 lb 12.8 oz (104.7 kg)   SpO2 98%   BMI 38.41 kg/m   Body mass index is 38.41 kg/m.  Advanced Directives 04/27/2020 09/25/2018 08/02/2018 02/01/2018 04/25/2017 01/30/2017 06/27/2016  Does Patient Have a Medical Advance Directive? No No No No No No No  Would patient like information on creating a medical advance directive? - No - Patient declined No - Patient declined No - Patient declined - No - Patient declined -  Pre-existing out of facility DNR order (yellow form or pink MOST form) - - - - - - -    Tobacco Social History   Tobacco Use  Smoking Status Former Smoker  . Packs/day: 1.00  . Years: 10.00  . Pack years: 10.00  . Types: Cigarettes  . Quit date: 07/14/2006  . Years since quitting: 13.7  Smokeless Tobacco Never Used  Tobacco Comment   quit smoking around 2007     Counseling given: Not Answered Comment: quit smoking around 2007   Clinical Intake:  Pre-visit preparation completed: Yes  Pain : No/denies pain     Diabetes: Yes CBG done?: No     Interpreter  Needed?: No     Past Medical History:  Diagnosis Date  . Blood transfusion without reported diagnosis    transfusion December 2016  . Clotting disorder (HCC)    left leg  . Diabetes mellitus without complication ([place])   . Hypertension   . Other and unspecified ovarian cysts   . Stroke ([place])   . Vaginal Pap smear, abnormal    Past Surgical History:  Procedure Laterality Date  . COLONOSCOPY WITH PROPOFOL N/A 11/10/2015   Procedure: COLONOSCOPY WITH PROPOFOL;  Surgeon: Lucilla Lame, MD;  Location: [facility] ENDOSCOPY;  Service: Endoscopy;  Laterality: N/A;  . ESOPHAGOGASTRODUODENOSCOPY (EGD) WITH PROPOFOL N/A 11/10/2015   Procedure: ESOPHAGOGASTRODUODENOSCOPY (EGD) WITH PROPOFOL;  Surgeon: Lucilla Lame, MD;  Location: [facility] ENDOSCOPY;  Service: Endoscopy;  Laterality: N/A;  . LAPAROSCOPIC GASTRIC SLEEVE RESECTION  02/17/2016   placement  . LOOP RECORDER IMPLANT N/A 04/25/2014   Procedure: LOOP RECORDER IMPLANT;  Surgeon: Coralyn Mark, MD;  Location: [place] CATH LAB;  Service: Cardiovascular;  Laterality: N/A;  . LOOP RECORDER REMOVAL N/A 02/01/2018   Procedure: LOOP RECORDER REMOVAL;  Surgeon: Deboraha Sprang, MD;  Location: [place] CV LAB;  Service: Cardiovascular;  Laterality: N/A;  . OVARIAN CYST REMOVAL    . TEE WITHOUT CARDIOVERSION N/A 01/25/2013   Procedure: TRANSESOPHAGEAL ECHOCARDIOGRAM (TEE);  Surgeon: Birdie Riddle, MD;  Location: [facility] ENDOSCOPY;  Service: Cardiovascular;  Laterality: N/A;   Family History  Problem Relation  Age of Onset  . Diabetes Mellitus II Mother   . Diabetes Mother   . Stroke Mother   . Transient ischemic attack Father   . Stroke Father   . Stroke Other   . Diabetes Other   . Diabetes Sister    Social History   Socioeconomic History  . Marital status: Single    Spouse name: Not on file  . Number of children: 2  . Years of education: 53  . Highest education level: Not on file  Occupational History  . Not on file  Tobacco Use  . Smoking status:  Former Smoker    Packs/day: 1.00    Years: 10.00    Pack years: 10.00    Types: Cigarettes    Quit date: 07/14/2006    Years since quitting: 13.7  . Smokeless tobacco: Never Used  . Tobacco comment: quit smoking around 2007  Substance and Sexual Activity  . Alcohol use: No    Alcohol/week: 0.0 standard drinks  . Drug use: No  . Sexual activity: Yes  Other Topics Concern  . Not on file  Social History Narrative   Patient is single with two children.   Patient is right handed.   Patient has 13 yrs of education.   Patient drinks 5 sodas daily.   Social Determinants of Health   Financial Resource Strain:   . Difficulty of Paying Living Expenses:   Food Insecurity:   . Worried About Charity fundraiser in the Last Year:   . Arboriculturist in the Last Year:   Transportation Needs:   . Film/video editor (Medical):   Marland Kitchen Lack of Transportation (Non-Medical):   Physical Activity:   . Days of Exercise per Week:   . Minutes of Exercise per Session:   Stress:   . Feeling of Stress :   Social Connections:   . Frequency of Communication with Friends and Family:   . Frequency of Social Gatherings with Friends and Family:   . Attends Religious Services:   . Active Member of Clubs or Organizations:   . Attends Archivist Meetings:   Marland Kitchen Marital Status:     Outpatient Encounter Medications as of 04/27/2020  Medication Sig  . amLODipine (NORVASC) 10 MG tablet Take 1 tablet (10 mg total) by mouth daily.  . fluticasone (FLONASE) 50 MCG/ACT nasal spray Place 2 sprays into both nostrils daily.  . hydrochlorothiazide (HYDRODIURIL) 12.5 MG tablet Take 1 tablet (12.5 mg total) by mouth daily.  . metoprolol tartrate (LOPRESSOR) 25 MG tablet Take 1 tablet by mouth twice daily.  Marland Kitchen omeprazole (PRILOSEC) 20 MG capsule Take 1 capsule (20 mg total) by mouth daily.  . potassium chloride (KLOR-CON 10) 10 MEQ tablet Take 1 tablet (10 mEq total) by mouth daily.  Marland Kitchen warfarin (COUMADIN) 5 MG  tablet Take 0.5-1 tablet by mouth daily AS DIRECTED BY  COUMADIN  CLINIC  . atorvastatin (LIPITOR) 40 MG tablet Take 1 tablet (40 mg total) by mouth daily. (Patient not taking: Reported on 04/27/2020)   No facility-administered encounter medications on file as of 04/27/2020.    Activities of Daily Living In your present state of health, do you have any difficulty performing the following activities: 04/27/2020  Hearing? N  Vision? Y  Comment sometimes  Difficulty concentrating or making decisions? Y  Comment since her strokeshe has needed more time to think things through  Walking or climbing stairs? N  Dressing or bathing? N  Doing errands,  shopping? N  Some recent data might be hidden    Constitutional: normal appearing,  Eyes: PERRLA HEENT: Head is atraumatic, normal sinuses, normal oropharynx, normal appearing tonsils and palate, tympanic membrane is normal bilaterally. Neck: normal range of motion, no thyromegaly, no JVD Breast: No lumps, no tenderness Cardiovascular: normal rate and rhythm, normal heart sounds, no murmurs, rub or gallop, no pedal edema Respiratory: Normal breath sounds, clear to auscultation bilaterally, no wheezes, no rales, no rhonchi Abdomen: soft, not tender to palpation, normal bowel sounds, no enlarged organs Musculoskeletal: Full ROM, no tenderness in joints Genitourinary: Normal external genitalia, vagina, cervix, adnexa-normal Skin: Intertrigo in inframammary fold Neurological: alert, oriented x3, cranial nerves I-XII grossly intact , normal motor strength, normal sensation. Psychological: normal mood.   Patient Care Team: [name], MD as PCP - General (Family Medicine) Minna Merritts, MD as PCP - Cardiology (Cardiology) Deboraha Sprang, MD as PCP - Electrophysiology (Cardiology)    Assessment:   This is a routine wellness examination for Mary [name].  Exercise Activities and Dietary recommendations    Goals   None     Fall Risk Fall  Risk  04/27/2020 07/16/2019 05/28/2019 01/29/2019 09/25/2018  Falls in the past year? 0 0 0 0 No   Is the patient's home free of loose throw rugs in walkways, pet beds, electrical cords, etc?   yes      Grab bars in the bathroom? no      Handrails on the stairs?   no      Adequate lighting?   yes  Timed Get Up and Go performed: yes  Depression Screen PHQ 2/9 Scores 04/27/2020 04/05/2019 01/29/2019 07/30/2018  PHQ - 2 Score 0 0 0 2  PHQ- 9 Score - 0 3 6     Cognitive Function  Normal      Immunization History  Administered Date(s) Administered  . Influenza,inj,Quad PF,6+ Mos 08/29/2019  . PPD Test 04/12/2018  . Tdap 01/30/2017    Qualifies for Shingles Vaccine?declines  Screening Tests Health Maintenance  Topic Date Due  . PNEUMOCOCCAL POLYSACCHARIDE VACCINE AGE 75-64 HIGH RISK  Never done  . OPHTHALMOLOGY EXAM  Never done  . COVID-19 Vaccine (1) Never done  . FOOT EXAM  06/09/2020  . INFLUENZA VACCINE  07/19/2020  . HEMOGLOBIN A1C  09/24/2020  . COLONOSCOPY  11/09/2020  . MAMMOGRAM  11/26/2020  . URINE MICROALBUMIN  03/25/2021  . PAP SMEAR-Modifier  09/25/2021  . TETANUS/TDAP  01/30/2027  . HIV Screening  Completed    Cancer Screenings: Lung: Low Dose CT Chest recommended if Age 25-80 years, 30 pack-year currently smoking OR have quit w/in 15years. Patient does not qualify. Breast:  Up to date on Mammogram? No   Up to date of Bone Density/Dexa? No not indicated Colorectal: yes  Additional Screenings: ordered Hepatitis C Screening:      Plan:    1. Encounter for Medicare annual wellness exam Counseled on 150 minutes of exercise per week, healthy eating (including decreased daily intake of saturated fats, cholesterol, added sugars, sodium), routine healthcare maintenance. She declines Pneumovax - Hemoglobin A1c  2. Screening for viral disease - HCV RNA quant rflx ultra or genotyp([facility])  3. Encounter for screening mammogram for malignant neoplasm of  breast - MM 3D SCREEN BREAST BILATERAL; Future  4. Screening for cervical cancer - Cytology - PAP([place])    I have personally reviewed and noted the following in the patient's chart:   . Medical and social history . Use  of alcohol, tobacco or illicit drugs  . Current medications and supplements . Functional ability and status . Nutritional status . Physical activity . Advanced directives . List of other physicians . Hospitalizations, surgeries, and ER visits in previous 12 months . Vitals . Screenings to include cognitive, depression, and falls . Referrals and appointments  In addition, I have reviewed and discussed with patient certain preventive protocols, quality metrics, and best practice recommendations. A written personalized care plan for preventive services as well as general preventive health recommendations were provided to patient.     [name], MD  04/27/2020

## 2020-04-27 NOTE — Patient Instructions (Signed)
  Mary [name] , Thank you for taking time to come for your Medicare Wellness Visit. I appreciate your ongoing commitment to your health goals. Please review the following plan we discussed and let me know if I can assist you in the future.   These are the goals we discussed: Goals   None     This is a list of the screening recommended for you and due dates:  Health Maintenance  Topic Date Due  . Pneumococcal vaccine  Never done  . Eye exam for diabetics  Never done  . COVID-19 Vaccine (1) Never done  . Complete foot exam   06/09/2020  . Flu Shot  07/19/2020  . Hemoglobin A1C  09/24/2020  . Colon Cancer Screening  11/09/2020  . Mammogram  11/26/2020  . Urine Protein Check  03/25/2021  . Pap Smear  09/25/2021  . Tetanus Vaccine  01/30/2027  . HIV Screening  Completed

## 2020-04-27 NOTE — Patient Instructions (Signed)
Please continue warfarin dosage of 1 pill (5 mg) every day except 1/2 tablets (2.5 mg) on Mondays & Fridays.  Recheck in 6 weeks.

## 2020-04-28 LAB — CYTOLOGY - PAP
Comment: NEGATIVE
Diagnosis: NEGATIVE
High risk HPV: NEGATIVE

## 2020-04-28 LAB — HEMOGLOBIN A1C
Est. average glucose Bld gHb Est-mCnc: 137 mg/dL
Hgb A1c MFr Bld: 6.4 % — ABNORMAL HIGH (ref 4.8–5.6)

## 2020-04-28 LAB — HCV RNA QUANT RFLX ULTRA OR GENOTYP: HCV Quant Baseline: NOT DETECTED IU/mL

## 2020-05-08 ENCOUNTER — Ambulatory Visit: Payer: Medicare Other

## 2020-06-08 ENCOUNTER — Ambulatory Visit (INDEPENDENT_AMBULATORY_CARE_PROVIDER_SITE_OTHER): Payer: Managed Care, Other (non HMO)

## 2020-06-08 ENCOUNTER — Other Ambulatory Visit: Payer: Self-pay

## 2020-06-08 DIAGNOSIS — Z5181 Encounter for therapeutic drug level monitoring: Secondary | ICD-10-CM

## 2020-06-08 DIAGNOSIS — Z8673 Personal history of transient ischemic attack (TIA), and cerebral infarction without residual deficits: Secondary | ICD-10-CM | POA: Diagnosis not present

## 2020-06-08 DIAGNOSIS — I639 Cerebral infarction, unspecified: Secondary | ICD-10-CM

## 2020-06-08 LAB — POCT INR: INR: 1.9 — AB (ref 2.0–3.0)

## 2020-06-08 NOTE — Patient Instructions (Signed)
-   take 1 whole tablet today, then  °- continue warfarin dosage of 1 pill (5 mg) every day except 1/2 tablets (2.5 mg) on Mondays & Fridays.  °- recheck in 6 weeks. ° °

## 2020-07-20 ENCOUNTER — Other Ambulatory Visit: Payer: Self-pay

## 2020-07-20 ENCOUNTER — Ambulatory Visit (INDEPENDENT_AMBULATORY_CARE_PROVIDER_SITE_OTHER): Payer: Managed Care, Other (non HMO)

## 2020-07-20 DIAGNOSIS — I639 Cerebral infarction, unspecified: Secondary | ICD-10-CM | POA: Diagnosis not present

## 2020-07-20 DIAGNOSIS — Z5181 Encounter for therapeutic drug level monitoring: Secondary | ICD-10-CM

## 2020-07-20 DIAGNOSIS — Z8673 Personal history of transient ischemic attack (TIA), and cerebral infarction without residual deficits: Secondary | ICD-10-CM | POA: Diagnosis not present

## 2020-07-20 LAB — POCT INR: INR: 2.9 (ref 2.0–3.0)

## 2020-07-20 NOTE — Patient Instructions (Signed)
-   continue warfarin dosage of 1 pill (5 mg) every day except 1/2 tablets (2.5 mg) on Mondays & Fridays.  - recheck in 6 weeks. 

## 2020-08-24 ENCOUNTER — Other Ambulatory Visit: Payer: Self-pay | Admitting: Internal Medicine

## 2020-09-07 ENCOUNTER — Other Ambulatory Visit: Payer: Self-pay

## 2020-09-07 ENCOUNTER — Ambulatory Visit (INDEPENDENT_AMBULATORY_CARE_PROVIDER_SITE_OTHER): Payer: Managed Care, Other (non HMO)

## 2020-09-07 DIAGNOSIS — I639 Cerebral infarction, unspecified: Secondary | ICD-10-CM

## 2020-09-07 DIAGNOSIS — Z5181 Encounter for therapeutic drug level monitoring: Secondary | ICD-10-CM | POA: Diagnosis not present

## 2020-09-07 DIAGNOSIS — Z8673 Personal history of transient ischemic attack (TIA), and cerebral infarction without residual deficits: Secondary | ICD-10-CM

## 2020-09-07 LAB — POCT INR: INR: 1.9 — AB (ref 2.0–3.0)

## 2020-09-07 NOTE — Patient Instructions (Signed)
-   take 1 whole tablet today, then  - continue warfarin dosage of 1 pill (5 mg) every day except 1/2 tablets (2.5 mg) on Mondays & Fridays.  - recheck in 6 weeks.

## 2020-09-23 ENCOUNTER — Other Ambulatory Visit: Payer: Managed Care, Other (non HMO)

## 2020-10-13 ENCOUNTER — Ambulatory Visit: Payer: Managed Care, Other (non HMO) | Admitting: Family Medicine

## 2020-10-18 NOTE — Progress Notes (Signed)
Cardiology Office Note  Date:  10/19/2020   ID:  Mary [name], DOB 1966/11/05, MRN [id number]  PCP:  [name], MD   Chief Complaint  Patient presents with  . Follow-up    12 month  follow up. Meds reviewed by the pt. verbally. "doing well."     HPI:  Mary [name] is a 54 y.o. female   bariatric surgery. History of stroke Antiphospholipid syndrome,  on warfarin OSA , not on CPAP Previous loop monitor placed,  previous stroke, she has some difficulty with walking.  Who presents for routine follow-up of her stroke  2020 with some chest tightness Cardiac CTA performed with normal coronaries Not having any further symptoms Sx were at rest Symptoms seem to have resolved  Cardiac CTA reviewed with her, calcium score 0, normal coronaries  Previously implanted loop monitor has been replaced  Denies having sleep apnea symptoms Previous records reviewed Ten episodes of 3+ sec pauses noted Unable to afford her CPAP  Active, no regular exercise program Denies shortness of breath, orthopnea  Lab work reviewed with her, previously with low potassium 3, up to 3.3 on repeat, she has stopped her potassium pill  Hemoglobin A1c up to 6.4, watching her diet Also has stopped her cholesterol medication  EKG personally reviewed by myself on todays visit Shows normal sinus rhythm rate 55 bpm no significant ST or T wave changes   PMH:   has a past medical history of Blood transfusion without reported diagnosis, Clotting disorder ([place]), Diabetes mellitus without complication ([place]), Hypertension, Other and unspecified ovarian cysts, Stroke ([place]), and Vaginal Pap smear, abnormal.  PSH:    Past Surgical History:  Procedure Laterality Date  . COLONOSCOPY WITH PROPOFOL N/A 11/10/2015   Procedure: COLONOSCOPY WITH PROPOFOL;  Surgeon: Lucilla Lame, MD;  Location: [facility] ENDOSCOPY;  Service: Endoscopy;  Laterality: N/A;  . ESOPHAGOGASTRODUODENOSCOPY (EGD) WITH PROPOFOL N/A 11/10/2015    Procedure: ESOPHAGOGASTRODUODENOSCOPY (EGD) WITH PROPOFOL;  Surgeon: Lucilla Lame, MD;  Location: [facility] ENDOSCOPY;  Service: Endoscopy;  Laterality: N/A;  . LAPAROSCOPIC GASTRIC SLEEVE RESECTION  02/17/2016   placement  . LOOP RECORDER IMPLANT N/A 04/25/2014   Procedure: LOOP RECORDER IMPLANT;  Surgeon: Coralyn Mark, MD;  Location: [facility] CATH LAB;  Service: Cardiovascular;  Laterality: N/A;  . LOOP RECORDER REMOVAL N/A 02/01/2018   Procedure: LOOP RECORDER REMOVAL;  Surgeon: Deboraha Sprang, MD;  Location: [facility] CV LAB;  Service: Cardiovascular;  Laterality: N/A;  . OVARIAN CYST REMOVAL    . TEE WITHOUT CARDIOVERSION N/A 01/25/2013   Procedure: TRANSESOPHAGEAL ECHOCARDIOGRAM (TEE);  Surgeon: Birdie Riddle, MD;  Location: [facility] ENDOSCOPY;  Service: Cardiovascular;  Laterality: N/A;    Current Outpatient Medications  Medication Sig Dispense Refill  . amLODipine (NORVASC) 10 MG tablet Take 1 tablet (10 mg total) by mouth daily. 30 tablet 6  . fluticasone (FLONASE) 50 MCG/ACT nasal spray Place 2 sprays into both nostrils daily. 16 g 6  . hydrochlorothiazide (HYDRODIURIL) 12.5 MG tablet Take 1 tablet (12.5 mg total) by mouth daily. 60 tablet 6  . metoprolol tartrate (LOPRESSOR) 25 MG tablet Take 1 tablet by mouth twice daily. 60 tablet 6  . warfarin (COUMADIN) 5 MG tablet TAKE 1/2 TO 1 (ONE-HALF TO ONE) TABLET BY MOUTH ONCE DAILY AS DIRECTED BY  COUMADIN  CLINIC 90 tablet 0  . atorvastatin (LIPITOR) 40 MG tablet Take 1 tablet (40 mg total) by mouth daily. 90 tablet 3  . potassium chloride (KLOR-CON 10) 10 MEQ tablet Take  1 tablet (10 mEq total) by mouth daily. 90 tablet 3   No current facility-administered medications for this visit.     Allergies:   Patient has no known allergies.   Social History:  The patient  reports that she quit smoking about 14 years ago. Her smoking use included cigarettes. She has a 10.00 pack-year smoking history. She has never used smokeless tobacco. She reports  that she does not drink alcohol and does not use drugs.   Family History:   family history includes Diabetes in her mother, sister, and another family member; Diabetes Mellitus II in her mother; Stroke in her father, mother, and another family member; Transient ischemic attack in her father.    Review of Systems: Review of Systems  Constitutional: Negative.   HENT: Negative.   Respiratory: Negative.   Cardiovascular: Negative.   Gastrointestinal: Negative.   Musculoskeletal: Negative.   Neurological: Negative.   Psychiatric/Behavioral: Negative.   All other systems reviewed and are negative.   PHYSICAL EXAM: VS:  BP 110/78 (BP Location: Left Arm, Patient Position: Sitting, Cuff Size: Normal)   Pulse (!) 55   Ht 5\' 5"  (1.651 m)   Wt 230 lb 8 oz (104.6 kg)   SpO2 99%   BMI 38.36 kg/m  , BMI Body mass index is 38.36 kg/m. Constitutional:  oriented to person, place, and time. No distress.  HENT:  Head: Grossly normal Eyes:  no discharge. No scleral icterus.  Neck: No JVD, no carotid bruits  Cardiovascular: Regular rate and rhythm, no murmurs appreciated Pulmonary/Chest: Clear to auscultation bilaterally, no wheezes or rails Abdominal: Soft.  no distension.  no tenderness.  Musculoskeletal: Normal range of motion Neurological:  normal muscle tone. Coordination normal. No atrophy Skin: Skin warm and dry Psychiatric: normal affect, pleasant  Recent Labs: 12/16/2019: Hemoglobin 11.9; Platelets 262 03/25/2020: ALT 15; BUN 14; Creatinine, Ser 1.09; Potassium 3.3; Sodium 143    Lipid Panel Lab Results  Component Value Date   CHOL 148 03/25/2020   HDL 43 03/25/2020   LDLCALC 77 03/25/2020   TRIG 162 (H) 03/25/2020      Wt Readings from Last 3 Encounters:  10/19/20 230 lb 8 oz (104.6 kg)  04/27/20 230 lb 12.8 oz (104.7 kg)  03/25/20 230 lb (104.3 kg)      ASSESSMENT AND PLAN:  Cryptogenic stroke (HCC) On warfarin for antiphospholipid syndrome Denies any further  symptoms Refilled her cholesterol medication  Essential hypertension - Plan: EKG 12-Lead Blood pressure is well controlled on today's visit. No changes made to the medications.  We have refilled her potassium  Antiphospholipid syndrome (HCC) On warfarin as above INR 4.2 today, adjustments made  Sleep apnea, unspecified type Unable to afford her CPAP Having pauses as recorded on loop monitor Reports she is asymptomatic  Loop monitor explant Explanted over a year ago,   Total encounter time more than 25 minutes  Greater than 50% was spent in counseling and coordination of care with the patient   Orders Placed This Encounter  Procedures  . EKG 12-Lead     Signed, Esmond Plants, M.D., Ph.D. 10/19/2020  [place], [place]

## 2020-10-19 ENCOUNTER — Other Ambulatory Visit: Payer: Self-pay

## 2020-10-19 ENCOUNTER — Ambulatory Visit (INDEPENDENT_AMBULATORY_CARE_PROVIDER_SITE_OTHER): Payer: Managed Care, Other (non HMO) | Admitting: Cardiovascular Disease

## 2020-10-19 ENCOUNTER — Ambulatory Visit (INDEPENDENT_AMBULATORY_CARE_PROVIDER_SITE_OTHER): Payer: Managed Care, Other (non HMO)

## 2020-10-19 ENCOUNTER — Encounter: Payer: Self-pay | Admitting: Cardiovascular Disease

## 2020-10-19 DIAGNOSIS — E1159 Type 2 diabetes mellitus with other circulatory complications: Secondary | ICD-10-CM

## 2020-10-19 DIAGNOSIS — R0600 Dyspnea, unspecified: Secondary | ICD-10-CM

## 2020-10-19 DIAGNOSIS — I639 Cerebral infarction, unspecified: Secondary | ICD-10-CM

## 2020-10-19 DIAGNOSIS — E785 Hyperlipidemia, unspecified: Secondary | ICD-10-CM

## 2020-10-19 DIAGNOSIS — I10 Essential (primary) hypertension: Secondary | ICD-10-CM

## 2020-10-19 DIAGNOSIS — Z8673 Personal history of transient ischemic attack (TIA), and cerebral infarction without residual deficits: Secondary | ICD-10-CM

## 2020-10-19 DIAGNOSIS — Z5181 Encounter for therapeutic drug level monitoring: Secondary | ICD-10-CM

## 2020-10-19 DIAGNOSIS — G4730 Sleep apnea, unspecified: Secondary | ICD-10-CM | POA: Diagnosis not present

## 2020-10-19 DIAGNOSIS — D6861 Antiphospholipid syndrome: Secondary | ICD-10-CM

## 2020-10-19 DIAGNOSIS — R079 Chest pain, unspecified: Secondary | ICD-10-CM

## 2020-10-19 LAB — POCT INR: INR: 4.2 — AB (ref 2.0–3.0)

## 2020-10-19 MED ORDER — ATORVASTATIN CALCIUM 40 MG PO TABS: 40.0000 mg | ORAL_TABLET | Freq: Every day | ORAL | 3 refills | Status: DC

## 2020-10-19 MED ORDER — POTASSIUM CHLORIDE ER 10 MEQ PO TBCR: 10.0000 meq | EXTENDED_RELEASE_TABLET | Freq: Every day | ORAL | 3 refills | Status: DC

## 2020-10-19 NOTE — Patient Instructions (Signed)
-   SKIP WARFARIN TODAY, - TAKE 1/2 TABLET TOMORROW, then - on Wednesday, resume warfarin dosage of 1 pill (5 mg) every day except 1/2 tablets (2.5 mg) on Mondays & Fridays.  - recheck in 4 weeks.

## 2020-10-19 NOTE — Patient Instructions (Signed)
Medication Instructions:  Restart atorvastatin (cholesterol pill) Restart potassium pill one a day  If you need a refill on your cardiac medications before your next appointment, please call your pharmacy.    Lab work: No new labs needed   If you have labs (blood work) drawn today and your tests are completely normal, you will receive your results only by: Marland Kitchen MyChart Message (if you have MyChart) OR . A paper copy in the mail If you have any lab test that is abnormal or we need to change your treatment, we will call you to review the results.   Testing/Procedures: No new testing needed   Follow-Up: At [facility], you and your health needs are our priority.  As part of our continuing mission to provide you with exceptional heart care, we have created designated Provider Care Teams.  These Care Teams include your primary Cardiologist (physician) and Advanced Practice Providers (APPs -  Physician Assistants and Nurse Practitioners) who all work together to provide you with the care you need, when you need it.  . You will need a follow up appointment in 12 months  . Providers on your designated Care Team:   . Murray Hodgkins, NP . Christell Faith, PA-C . Marrianne Mood, PA-C  Any Other Special Instructions Will Be Listed Below (If Applicable).  COVID-19 Vaccine Information can be found at: ShippingScam.co.uk For questions related to vaccine distribution or appointments, please email [email or web address] or call [phone].

## 2020-11-23 ENCOUNTER — Other Ambulatory Visit: Payer: Self-pay

## 2020-11-23 ENCOUNTER — Ambulatory Visit (INDEPENDENT_AMBULATORY_CARE_PROVIDER_SITE_OTHER): Payer: Managed Care, Other (non HMO)

## 2020-11-23 DIAGNOSIS — Z5181 Encounter for therapeutic drug level monitoring: Secondary | ICD-10-CM | POA: Diagnosis not present

## 2020-11-23 DIAGNOSIS — I639 Cerebral infarction, unspecified: Secondary | ICD-10-CM | POA: Diagnosis not present

## 2020-11-23 DIAGNOSIS — Z8673 Personal history of transient ischemic attack (TIA), and cerebral infarction without residual deficits: Secondary | ICD-10-CM

## 2020-11-23 LAB — POCT INR: INR: 2.5 (ref 2.0–3.0)

## 2020-11-23 NOTE — Patient Instructions (Signed)
-   continue warfarin dosage of 1 pill (5 mg) every day except 1/2 tablets (2.5 mg) on Mondays & Fridays.  - recheck in 5 weeks.

## 2020-12-24 ENCOUNTER — Other Ambulatory Visit: Payer: Self-pay | Admitting: Internal Medicine

## 2020-12-24 NOTE — Telephone Encounter (Signed)
This is a [place] pt 

## 2021-01-20 ENCOUNTER — Ambulatory Visit (INDEPENDENT_AMBULATORY_CARE_PROVIDER_SITE_OTHER): Payer: Managed Care, Other (non HMO)

## 2021-01-20 ENCOUNTER — Other Ambulatory Visit: Payer: Self-pay

## 2021-01-20 DIAGNOSIS — Z8673 Personal history of transient ischemic attack (TIA), and cerebral infarction without residual deficits: Secondary | ICD-10-CM

## 2021-01-20 DIAGNOSIS — I639 Cerebral infarction, unspecified: Secondary | ICD-10-CM | POA: Diagnosis not present

## 2021-01-20 DIAGNOSIS — Z5181 Encounter for therapeutic drug level monitoring: Secondary | ICD-10-CM | POA: Diagnosis not present

## 2021-01-20 LAB — POCT INR: INR: 2.3 (ref 2.0–3.0)

## 2021-01-20 NOTE — Patient Instructions (Signed)
-   take extra 1/2 tablet today - continue warfarin dosage of 1 pill (5 mg) every day except 1/2 tablets (2.5 mg) on Mondays & Fridays.  - recheck in 5 weeks.

## 2021-03-01 ENCOUNTER — Telehealth: Payer: Self-pay | Admitting: Family Medicine

## 2021-03-01 ENCOUNTER — Telehealth: Payer: Self-pay

## 2021-03-01 NOTE — Telephone Encounter (Signed)
Please call patient and schedule her an appointment [facility] has opening end of April

## 2021-03-01 NOTE — Telephone Encounter (Signed)
Copied from [place] [id number]. Topic: Appointment Scheduling - Scheduling Inquiry for Clinic >> Mar 01, 2021 10:05 AM Tessa Lerner A wrote: Reason for CRM: Patient suffered a stroke roughly 6 years ago and has concerns regarding neurology referrals and emotions  Patient would like to speak with PCP or a member of clinical staff regarding feelings and emotions they're going through  Patient has began to feel stressed and overwhelmed at various points throughout the day  Patient has recently changed jobs and concerned with the amount of stress and it's potential effect   Please conact to advise further   The earliest available appt at the time of call with agent was 04/27/21 at 3:10 PM, patient declined the appointment at the time of call

## 2021-03-01 NOTE — Telephone Encounter (Signed)
Reached out to patient to provide details for the [facility] for the remainder of the week. Patient shared they will try to visit the Unit tomorrow.

## 2021-03-01 NOTE — Telephone Encounter (Signed)
Request sent from [facility] office staff regarding addressing patients medical concerns on MMU. Staff followed up with a phone call to the patient and provided info for MMU.

## 2021-03-02 ENCOUNTER — Ambulatory Visit: Payer: Managed Care, Other (non HMO) | Admitting: Physician Assistant

## 2021-03-02 ENCOUNTER — Other Ambulatory Visit: Payer: Self-pay

## 2021-03-02 VITALS — BP 145/84 | HR 75 | Temp 98.2°F | Resp 18 | Ht 65.0 in | Wt 227.0 lb

## 2021-03-02 DIAGNOSIS — G44201 Tension-type headache, unspecified, intractable: Secondary | ICD-10-CM | POA: Diagnosis not present

## 2021-03-02 DIAGNOSIS — R7303 Prediabetes: Secondary | ICD-10-CM | POA: Diagnosis not present

## 2021-03-02 DIAGNOSIS — Z1159 Encounter for screening for other viral diseases: Secondary | ICD-10-CM | POA: Diagnosis not present

## 2021-03-02 DIAGNOSIS — I10 Essential (primary) hypertension: Secondary | ICD-10-CM | POA: Diagnosis not present

## 2021-03-02 DIAGNOSIS — E559 Vitamin D deficiency, unspecified: Secondary | ICD-10-CM

## 2021-03-02 DIAGNOSIS — Z8673 Personal history of transient ischemic attack (TIA), and cerebral infarction without residual deficits: Secondary | ICD-10-CM

## 2021-03-02 DIAGNOSIS — F439 Reaction to severe stress, unspecified: Secondary | ICD-10-CM

## 2021-03-02 LAB — POCT GLYCOSYLATED HEMOGLOBIN (HGB A1C): Hemoglobin A1C: 5.9 % — AB (ref 4.0–5.6)

## 2021-03-02 MED ORDER — METOPROLOL TARTRATE 25 MG PO TABS: ORAL_TABLET | ORAL | 0 refills | Status: DC

## 2021-03-02 MED ORDER — ATORVASTATIN CALCIUM 40 MG PO TABS: 40.0000 mg | ORAL_TABLET | Freq: Every day | ORAL | 0 refills | Status: DC

## 2021-03-02 MED ORDER — AMLODIPINE BESYLATE 10 MG PO TABS: 10.0000 mg | ORAL_TABLET | Freq: Every day | ORAL | 0 refills | Status: DC

## 2021-03-02 MED ORDER — HYDROCHLOROTHIAZIDE 12.5 MG PO TABS: 12.5000 mg | ORAL_TABLET | Freq: Every day | ORAL | 0 refills | Status: DC

## 2021-03-02 NOTE — Progress Notes (Signed)
Established Patient Office Visit  Subjective:  Patient ID: Mary [name], female    DOB: 02/02/1966  Age: 55 y.o. MRN: [id number]  CC:  Chief Complaint  Patient presents with  . Hypertension    Headaches    HPI [name] states that she has been having headaches for the past two days, describes them as frontal, states that she has been using Tylenol with relief.  Reports that she has had increased stressors and attributes her headaches to that.  Reports that she is drinking 2-3 bottles of water, sleeping 6 to 7 hours a night.  Reports that she has a good support system to help her manage her stress.  Reports that she has not been checking her blood pressure at home, has not been out of medication.  Last office visit with primary care provider was in May 2021, has upcoming appointment with provider Apr 19, 2021  Past Medical History:  Diagnosis Date  . Blood transfusion without reported diagnosis    transfusion December 2016  . Clotting disorder (HCC)    left leg  . Diabetes mellitus without complication ([place])   . Hypertension   . Other and unspecified ovarian cysts   . Stroke ([place])   . Vaginal Pap smear, abnormal     Past Surgical History:  Procedure Laterality Date  . COLONOSCOPY WITH PROPOFOL N/A 11/10/2015   Procedure: COLONOSCOPY WITH PROPOFOL;  Surgeon: Lucilla Lame, MD;  Location: [facility] ENDOSCOPY;  Service: Endoscopy;  Laterality: N/A;  . ESOPHAGOGASTRODUODENOSCOPY (EGD) WITH PROPOFOL N/A 11/10/2015   Procedure: ESOPHAGOGASTRODUODENOSCOPY (EGD) WITH PROPOFOL;  Surgeon: Lucilla Lame, MD;  Location: [facility] ENDOSCOPY;  Service: Endoscopy;  Laterality: N/A;  . LAPAROSCOPIC GASTRIC SLEEVE RESECTION  02/17/2016   placement  . LOOP RECORDER IMPLANT N/A 04/25/2014   Procedure: LOOP RECORDER IMPLANT;  Surgeon: Coralyn Mark, MD;  Location: [facility] CATH LAB;  Service: Cardiovascular;  Laterality: N/A;  . LOOP RECORDER REMOVAL N/A 02/01/2018   Procedure: LOOP RECORDER REMOVAL;   Surgeon: Deboraha Sprang, MD;  Location: [facility] CV LAB;  Service: Cardiovascular;  Laterality: N/A;  . OVARIAN CYST REMOVAL    . TEE WITHOUT CARDIOVERSION N/A 01/25/2013   Procedure: TRANSESOPHAGEAL ECHOCARDIOGRAM (TEE);  Surgeon: Birdie Riddle, MD;  Location: [facility] ENDOSCOPY;  Service: Cardiovascular;  Laterality: N/A;    Family History  Problem Relation Age of Onset  . Diabetes Mellitus II Mother   . Diabetes Mother   . Stroke Mother   . Transient ischemic attack Father   . Stroke Father   . Stroke Other   . Diabetes Other   . Diabetes Sister     Social History   Socioeconomic History  . Marital status: Single    Spouse name: Not on file  . Number of children: 2  . Years of education: 74  . Highest education level: Not on file  Occupational History  . Not on file  Tobacco Use  . Smoking status: Former Smoker    Packs/day: 1.00    Years: 10.00    Pack years: 10.00    Types: Cigarettes    Quit date: 07/14/2006    Years since quitting: 14.6  . Smokeless tobacco: Never Used  . Tobacco comment: quit smoking around 2007  Vaping Use  . Vaping Use: Never used  Substance and Sexual Activity  . Alcohol use: No    Alcohol/week: 0.0 standard drinks  . Drug use: No  . Sexual activity: Yes  Other Topics Concern  .  Not on file  Social History Narrative   Patient is single with two children.   Patient is right handed.   Patient has 13 yrs of education.   Patient drinks 5 sodas daily.   Social Determinants of Health   Financial Resource Strain: Not on file  Food Insecurity: Not on file  Transportation Needs: Not on file  Physical Activity: Not on file  Stress: Not on file  Social Connections: Not on file  Intimate Partner Violence: Not on file    Outpatient Medications Prior to Visit  Medication Sig Dispense Refill  . fluticasone (FLONASE) 50 MCG/ACT nasal spray Place 2 sprays into both nostrils daily. 16 g 6  . warfarin (COUMADIN) 5 MG tablet TAKE 1/2 TO 1  (ONE-HALF TO ONE) TABLET BY MOUTH ONCE DAILY AS DIRECTED BY  COUMADIN  CLINIC 90 tablet 1  . amLODipine (NORVASC) 10 MG tablet Take 1 tablet (10 mg total) by mouth daily. 30 tablet 6  . atorvastatin (LIPITOR) 40 MG tablet Take 1 tablet (40 mg total) by mouth daily. 90 tablet 3  . hydrochlorothiazide (HYDRODIURIL) 12.5 MG tablet Take 1 tablet (12.5 mg total) by mouth daily. 60 tablet 6  . metoprolol tartrate (LOPRESSOR) 25 MG tablet Take 1 tablet by mouth twice daily. 60 tablet 6  . potassium chloride (KLOR-CON 10) 10 MEQ tablet Take 1 tablet (10 mEq total) by mouth daily. 90 tablet 3   No facility-administered medications prior to visit.    No Known Allergies  ROS Review of Systems  Constitutional: Negative for chills and fever.  HENT: Negative.   Eyes: Negative.   Respiratory: Negative for shortness of breath.   Cardiovascular: Negative for chest pain.  Gastrointestinal: Negative.   Endocrine: Negative.   Genitourinary: Negative.   Musculoskeletal: Negative.   Skin: Negative.   Allergic/Immunologic: Negative.   Neurological: Positive for headaches. Negative for dizziness and syncope.  Hematological: Negative.   Psychiatric/Behavioral: Negative.       Objective:    Physical Exam Vitals and nursing note reviewed.  Constitutional:      General: She is not in acute distress.    Appearance: Normal appearance. She is not ill-appearing.  HENT:     Head: Normocephalic and atraumatic.     Right Ear: External ear normal.     Left Ear: External ear normal.     Nose: Nose normal.     Mouth/Throat:     Mouth: Mucous membranes are moist.     Pharynx: Oropharynx is clear.  Eyes:     Extraocular Movements: Extraocular movements intact.     Conjunctiva/sclera: Conjunctivae normal.     Pupils: Pupils are equal, round, and reactive to light.  Cardiovascular:     Rate and Rhythm: Normal rate and regular rhythm.     Pulses: Normal pulses.     Heart sounds: Normal heart sounds.   Pulmonary:     Effort: Pulmonary effort is normal.     Breath sounds: Normal breath sounds.  Musculoskeletal:        General: Normal range of motion.     Cervical back: Normal range of motion and neck supple.  Skin:    General: Skin is warm and dry.  Neurological:     General: No focal deficit present.     Mental Status: She is alert and oriented to person, place, and time.  Psychiatric:        Mood and Affect: Mood normal.        Behavior: Behavior  normal.        Thought Content: Thought content normal.        Judgment: Judgment normal.     BP (!) 145/84 (BP Location: Left Arm, Patient Position: Sitting, Cuff Size: Normal)   Pulse 75   Temp 98.2 F (36.8 C) (Oral)   Resp 18   Ht 5\' 5"  (1.651 m)   Wt 227 lb (103 kg)   SpO2 100%   BMI 37.77 kg/m  Wt Readings from Last 3 Encounters:  03/02/21 227 lb (103 kg)  10/19/20 230 lb 8 oz (104.6 kg)  04/27/20 230 lb 12.8 oz (104.7 kg)     Health Maintenance Due  Topic Date Due  . Hepatitis C Screening  Never done  . PNEUMOCOCCAL POLYSACCHARIDE VACCINE AGE 65-64 HIGH RISK  Never done  . COVID-19 Vaccine (1) Never done  . OPHTHALMOLOGY EXAM  Never done  . FOOT EXAM  06/09/2020  . INFLUENZA VACCINE  07/19/2020  . COLONOSCOPY (Pts 45-44yrs Insurance coverage will need to be confirmed)  11/09/2020  . MAMMOGRAM  11/26/2020  . URINE MICROALBUMIN  03/25/2021    There are no preventive care reminders to display for this patient.  Lab Results  Component Value Date   TSH 1.16 05/09/2016   Lab Results  Component Value Date   WBC 4.0 12/16/2019   HGB 11.9 (L) 12/16/2019   HCT 35.0 (L) 12/16/2019   MCV 74.2 (L) 12/16/2019   PLT 262 12/16/2019   Lab Results  Component Value Date   NA 143 03/25/2020   K 3.3 (L) 03/25/2020   CO2 24 03/25/2020   GLUCOSE 90 03/25/2020   BUN 14 03/25/2020   CREATININE 1.09 (H) 03/25/2020   BILITOT 0.3 03/25/2020   ALKPHOS 77 03/25/2020   AST 23 03/25/2020   ALT 15 03/25/2020   PROT 7.3  03/25/2020   ALBUMIN 3.8 03/25/2020   CALCIUM 8.9 03/25/2020   ANIONGAP 10 12/16/2019   Lab Results  Component Value Date   CHOL 148 03/25/2020   Lab Results  Component Value Date   HDL 43 03/25/2020   Lab Results  Component Value Date   LDLCALC 77 03/25/2020   Lab Results  Component Value Date   TRIG 162 (H) 03/25/2020   Lab Results  Component Value Date   CHOLHDL 3.4 03/25/2020   Lab Results  Component Value Date   HGBA1C 5.9 (A) 03/02/2021      Assessment & Plan:   Problem List Items Addressed This Visit      Cardiovascular and Mediastinum   Essential hypertension   Relevant Medications   amLODipine (NORVASC) 10 MG tablet   atorvastatin (LIPITOR) 40 MG tablet   hydrochlorothiazide (HYDRODIURIL) 12.5 MG tablet   metoprolol tartrate (LOPRESSOR) 25 MG tablet   Other Relevant Orders   CBC with Differential/Platelet   Comp. Metabolic Panel (12)     Endocrine   Diabetes mellitus (HCC)   Relevant Medications   atorvastatin (LIPITOR) 40 MG tablet     Other   History of stroke   Relevant Medications   atorvastatin (LIPITOR) 40 MG tablet   Other Relevant Orders   Lipid panel   Headache - Primary   Relevant Medications   amLODipine (NORVASC) 10 MG tablet   metoprolol tartrate (LOPRESSOR) 25 MG tablet   Vitamin D deficiency   Relevant Orders   Vitamin D, 25-hydroxy    Other Visit Diagnoses    Stress       Encounter for HCV screening test  for low risk patient       Relevant Orders   HCV Ab w/Rflx to Verification     1. Acute intractable tension-type headache Encourage patient to increase water intake, work on managing stress.  Patient education given on ways to manage stress.  Red flags given for prompt reevaluation.  Patient was given appointment to follow-up with her primary care provider, make sure she had refills available until that appointment, return to mobile unit for fasting labs or present to community health and wellness center to have them  completed.  2. Stress   3. History of stroke  - atorvastatin (LIPITOR) 40 MG tablet; Take 1 tablet (40 mg total) by mouth daily.  Dispense: 60 tablet; Refill: 0 - Lipid panel; Future  4. Essential hypertension Cont current regimen - amLODipine (NORVASC) 10 MG tablet; Take 1 tablet (10 mg total) by mouth daily.  Dispense: 60 tablet; Refill: 0 - hydrochlorothiazide (HYDRODIURIL) 12.5 MG tablet; Take 1 tablet (12.5 mg total) by mouth daily.  Dispense: 60 tablet; Refill: 0 - metoprolol tartrate (LOPRESSOR) 25 MG tablet; Take 1 tablet by mouth twice daily.  Dispense: 60 tablet; Refill: 0 - CBC with Differential/Platelet; Future - Comp. Metabolic Panel (12); Future  5. Prediabetes A1c 5.9, improved from 6.4 10 months ago, patient education given on benefits of Metformin, patient refused prescription at this time. - HgB A1c - atorvastatin (LIPITOR) 40 MG tablet; Take 1 tablet (40 mg total) by mouth daily.  Dispense: 60 tablet; Refill: 0  6. Vitamin D deficiency  - Vitamin D, 25-hydroxy; Future  7. Encounter for HCV screening test for low risk patient  - HCV Ab w/Rflx to Verification; Future    I have reviewed the patient's medical history (PMH, PSH, Social History, Family History, Medications, and allergies) , and have been updated if relevant. I spent 30 minutes reviewing chart and  face to face time with patient.     Meds ordered this encounter  Medications  . amLODipine (NORVASC) 10 MG tablet    Sig: Take 1 tablet (10 mg total) by mouth daily.    Dispense:  60 tablet    Refill:  0    Order Specific Question:   Supervising Provider    Answer:   Joya Gaskins, PATRICK E [[id number]]  . atorvastatin (LIPITOR) 40 MG tablet    Sig: Take 1 tablet (40 mg total) by mouth daily.    Dispense:  60 tablet    Refill:  0    Order Specific Question:   Supervising Provider    Answer:   Joya Gaskins, PATRICK E [[id number]]  . hydrochlorothiazide (HYDRODIURIL) 12.5 MG tablet    Sig: Take 1 tablet (12.5 mg  total) by mouth daily.    Dispense:  60 tablet    Refill:  0    Order Specific Question:   Supervising Provider    Answer:   Joya Gaskins, PATRICK E [[id number]]  . metoprolol tartrate (LOPRESSOR) 25 MG tablet    Sig: Take 1 tablet by mouth twice daily.    Dispense:  60 tablet    Refill:  0    Order Specific Question:   Supervising Provider    Answer:   Elsie Stain [[id number]]    Follow-up: Return in about 1 week (around 03/09/2021) for Fasting  labs.    Loraine Grip Mayers, PA-C

## 2021-03-02 NOTE — Patient Instructions (Signed)
I made sure that you had refills of your medications prior to your visit with Dr. Margarita Rana at the beginning of May.  I encourage you to work on reducing your stress levels, increasing your hydration, and minimizing snacking.  I am placing fasting lab orders to be completed, you may return to the mobile medicine unit or to [facility] to have those drawn.  If you are going to have them completed at community [facility] center, you do need to call ahead to make a lab appointment.  Otherwise you can find out where the mobile unit is located and come visit [place] as we are a walk up basis.  I encourage you to check your blood pressure at home on a daily basis, keep a written log and have available for all office visits.  Please feel free to return to the mobile medicine unit if blood pressure readings at home remain elevated.  Please let us know if there is anything else we can do for you  Mary Rad, PA-C Physician Assistant [place] [email or web address]    Managing Stress, Adult Feeling a certain amount of stress is normal. Stress helps our body and mind get ready to deal with the demands of life. Stress hormones can motivate you to do well at work and meet your responsibilities. However severe or long-lasting (chronic) stress can affect your mental and physical health. Chronic stress puts you at higher risk for anxiety, depression, and other health problems like digestive problems, muscle aches, heart disease, high blood pressure, and stroke. What are the causes? Common causes of stress include:  Demands from work, such as deadlines, feeling overworked, or having long hours.  Pressures at home, such as money issues, disagreements with a spouse, or parenting issues.  Pressures from major life changes, such as divorce, moving, loss of a loved one, or chronic illness. You may be at higher risk for  stress-related problems if you do not get enough sleep, are in poor health, do not have emotional support, or have a mental health disorder like anxiety or depression. How to recognize stress Stress can make you:  Have trouble sleeping.  Feel sad, anxious, irritable, or overwhelmed.  Lose your appetite.  Overeat or want to eat unhealthy foods.  Want to use drugs or alcohol. Stress can also cause physical symptoms, such as:  Sore, tense muscles, especially in the shoulders and neck.  Headaches.  Trouble breathing.  A faster heart rate.  Stomach pain, nausea, or vomiting.  Diarrhea or constipation.  Trouble concentrating. Follow these instructions at home: Lifestyle  Identify the source of your stress and your reaction to it. See a therapist who can help you change your reactions.  When there are stressful events: ? Talk about it with family, friends, or co-workers. ? Try to think realistically about stressful events and not ignore them or overreact. ? Try to find the positives in a stressful situation and not focus on the negatives. ? Cut back on responsibilities at work and home, if possible. Ask for help from friends or family members if you need it.  Find ways to cope with stress, such as: ? Meditation. ? Deep breathing. ? Yoga or tai chi. ? Progressive muscle relaxation. ? Doing art, playing music, or reading. ? Making time for fun activities. ? Spending time with family and friends.  Get support from family, friends, or spiritual resources. Eating and drinking  Eat a healthy diet. This includes: ?  Eating foods that are high in fiber, such as beans, whole grains, and fresh fruits and vegetables. ? Limiting foods that are high in fat and processed sugars, such as fried and sweet foods.  Do not skip meals or overeat.  Drink enough fluid to keep your urine pale yellow. Alcohol use  Do not drink alcohol if: ? Your health care provider tells you not to  drink. ? You are pregnant, may be pregnant, or are planning to become pregnant.  Drinking alcohol is a way some people try to ease their stress. This can be dangerous, so if you drink alcohol: ? Limit how much you use to:  0-1 drink a day for women.  0-2 drinks a day for men. ? Be aware of how much alcohol is in your drink. In the [place] one drink equals one 12 oz bottle of beer (355 mL), one 5 oz glass of wine (148 mL), or one 1 oz glass of hard liquor (44 mL). Activity  Include 30 minutes of exercise in your daily schedule. Exercise is a good stress reducer.  Include time in your day for an activity that you find relaxing. Try taking a walk, going on a bike ride, reading a book, or listening to music.  Schedule your time in a way that lowers stress, and keep a consistent schedule. Prioritize what is most important to get done.   General instructions  Get enough sleep. Try to go to sleep and get up at about the same time every day.  Take over-the-counter and prescription medicines only as told by your health care provider.  Do not use any products that contain nicotine or tobacco, such as cigarettes, e-cigarettes, and chewing tobacco. If you need help quitting, ask your health care provider.  Do not use drugs or smoke to cope with stress.  Keep all follow-up visits as told by your health care provider. This is important. Where to find support  Talk with your health care provider about stress management or finding a support group.  Find a therapist to work with you on your stress management techniques. Contact a health care provider if:  Your stress symptoms get worse.  You are unable to manage your stress at home.  You are struggling to stop using drugs or alcohol. Get help right away if:  You may be a danger to yourself or others.  You have any thoughts of death or suicide. If you ever feel like you may hurt yourself or others, or have thoughts about taking your own  life, get help right away. You can go to your nearest emergency department or call:  Your local emergency services (911 in the [place]  A suicide crisis helpline, such as the National Suicide Prevention Lifeline at 915-611-4872. This is open 24 hours a day. Summary  Feeling a certain amount of stress is normal, but severe or long-lasting (chronic) stress can affect your mental and physical health.  Chronic stress can put you at higher risk for anxiety, depression, and other health problems like digestive problems, muscle aches, heart disease, high blood pressure, and stroke.  You may be at higher risk for stress-related problems if you do not get enough sleep, are in poor health, lack emotional support, or have a mental health disorder like anxiety or depression.  Identify the source of your stress and your reaction to it. Try talking about stressful events with family, friends, or co-workers, finding a coping method, or getting support from spiritual resources.  If  you need more help, talk with your health care provider about finding a support group or a mental health therapist. This information is not intended to replace advice given to you by your health care provider. Make sure you discuss any questions you have with your health care provider. Document Revised: 07/03/2019 Document Reviewed: 07/03/2019 Elsevier Patient Education  [place].    How to Take Your Blood Pressure Blood pressure is a measurement of how strongly your blood is pressing against the walls of your arteries. Arteries are blood vessels that carry blood from your heart throughout your body. Your health care provider takes your blood pressure at each office visit. You can also take your own blood pressure at home with a blood pressure monitor. You may need to take your own blood pressure to:  Confirm a diagnosis of high blood pressure (hypertension).  Monitor your blood pressure over time.  Make sure your  blood pressure medicine is working. Supplies needed:  Blood pressure monitor.  Dining room chair to sit in.  Table or desk.  Small notebook and pencil or pen. How to prepare To get the most accurate reading, avoid the following for 30 minutes before you check your blood pressure:  Drinking caffeine.  Drinking alcohol.  Eating.  Smoking.  Exercising. Five minutes before you check your blood pressure:  Use the bathroom and urinate so that you have an empty bladder.  Sit quietly in a dining room chair. Do not sit in a soft couch or an armchair. Do not talk. How to take your blood pressure To check your blood pressure, follow the instructions in the manual that came with your blood pressure monitor. If you have a digital blood pressure monitor, the instructions may be as follows: 1. Sit up straight in a chair. 2. Place your feet on the floor. Do not cross your ankles or legs. 3. Rest your left arm at the level of your heart on a table or desk or on the arm of a chair. 4. Pull up your shirt sleeve. 5. Wrap the blood pressure cuff around the upper part of your left arm, 1 inch (2.5 cm) above your elbow. It is best to wrap the cuff around bare skin. 6. Fit the cuff snugly around your arm. You should be able to place only one finger between the cuff and your arm. 7. Position the cord so that it rests in the bend of your elbow. 8. Press the power button. 9. Sit quietly while the cuff inflates and deflates. 10. Read the digital reading on the monitor screen and write the numbers down (record them) in a notebook. 11. Wait 2-3 minutes, then repeat the steps, starting at step 1.   What does my blood pressure reading mean? A blood pressure reading consists of a higher number over a lower number. Ideally, your blood pressure should be below 120/80. The first ("top") number is called the systolic pressure. It is a measure of the pressure in your arteries as your heart beats. The second  ("bottom") number is called the diastolic pressure. It is a measure of the pressure in your arteries as the heart relaxes. Blood pressure is classified into five stages. The following are the stages for adults who do not have a short-term serious illness or a chronic condition. Systolic pressure and diastolic pressure are measured in a unit called mm Hg (millimeters of mercury).  Normal  Systolic pressure: below 527.  Diastolic pressure: below 80. Elevated  Systolic pressure:  120-129.  Diastolic pressure: below 80. Hypertension stage 1  Systolic pressure: 614-431.  Diastolic pressure: 54-00. Hypertension stage 2  Systolic pressure: 867 or above.  Diastolic pressure: 90 or above. You can have elevated blood pressure or hypertension even if only the systolic or only the diastolic number in your reading is higher than normal. Follow these instructions at home:  Check your blood pressure as often as recommended by your health care provider.  Check your blood pressure at the same time every day.  Take your monitor to the next appointment with your health care provider to make sure that: ? You are using it correctly. ? It provides accurate readings.  Be sure you understand what your goal blood pressure numbers are.  Tell your health care provider if you are having any side effects from blood pressure medicine.  Keep all follow-up visits as told by your health care provider. This is important. General tips  Your health care provider can suggest a reliable monitor that will meet your needs. There are several types of home blood pressure monitors.  Choose a monitor that has an arm cuff. Do not choose a monitor that measures your blood pressure from your wrist or finger.  Choose a cuff that wraps snugly around your upper arm. You should be able to fit only one finger between your arm and the cuff.  You can buy a blood pressure monitor at most drugstores or online. Where to find  more information American Heart Association: www.heart.org Contact a health care provider if:  Your blood pressure is consistently high. Get help right away if:  Your systolic blood pressure is higher than 180.  Your diastolic blood pressure is higher than 120. Summary  Blood pressure is a measurement of how strongly your blood is pressing against the walls of your arteries.  A blood pressure reading consists of a higher number over a lower number. Ideally, your blood pressure should be below 120/80.  Check your blood pressure at the same time every day.  Avoid caffeine, alcohol, smoking, and exercise for 30 minutes prior to checking your blood pressure. These agents can affect the accuracy of the blood pressure reading. This information is not intended to replace advice given to you by your health care provider. Make sure you discuss any questions you have with your health care provider. Document Revised: 11/29/2019 Document Reviewed: 11/29/2019 Elsevier Patient Education  2021 [employer].

## 2021-03-02 NOTE — Progress Notes (Signed)
Patient has eaten today and took medications around 7:30am. Patient request refill on nasal spray. Patient complains of intermittent HA in the front of her head.

## 2021-03-02 NOTE — Telephone Encounter (Signed)
Called patient and reschedule her. She requested an appt on Monday because she is off work

## 2021-03-03 ENCOUNTER — Ambulatory Visit (INDEPENDENT_AMBULATORY_CARE_PROVIDER_SITE_OTHER): Payer: Managed Care, Other (non HMO)

## 2021-03-03 DIAGNOSIS — I639 Cerebral infarction, unspecified: Secondary | ICD-10-CM | POA: Diagnosis not present

## 2021-03-03 DIAGNOSIS — Z5181 Encounter for therapeutic drug level monitoring: Secondary | ICD-10-CM

## 2021-03-03 DIAGNOSIS — Z8673 Personal history of transient ischemic attack (TIA), and cerebral infarction without residual deficits: Secondary | ICD-10-CM

## 2021-03-03 LAB — POCT INR: INR: 2.9 (ref 2.0–3.0)

## 2021-03-03 NOTE — Patient Instructions (Signed)
-   continue warfarin dosage of 1 pill (5 mg) every day except 1/2 tablets (2.5 mg) on Mondays & Fridays.  - recheck in 6 weeks.

## 2021-03-22 ENCOUNTER — Encounter: Payer: Self-pay | Admitting: Dermatology

## 2021-03-22 ENCOUNTER — Other Ambulatory Visit: Payer: Self-pay

## 2021-03-22 ENCOUNTER — Ambulatory Visit (INDEPENDENT_AMBULATORY_CARE_PROVIDER_SITE_OTHER): Payer: Managed Care, Other (non HMO) | Admitting: Dermatology

## 2021-03-22 DIAGNOSIS — L409 Psoriasis, unspecified: Secondary | ICD-10-CM

## 2021-03-22 MED ORDER — ENSTILAR 0.005-0.064 % EX FOAM: CUTANEOUS | 2 refills | Status: DC

## 2021-03-22 NOTE — Patient Instructions (Addendum)
Start Enstilar foam 2 times daily x 2 weeks then decrease to once daily for 5 days a week to affected areas. Avoid applying to face, groin, and axilla. Use as directed. Risk of skin atrophy with long-term use reviewed.   Topical steroids (such as triamcinolone, fluocinolone, fluocinonide, mometasone, clobetasol, halobetasol, betamethasone, hydrocortisone) can cause thinning and lightening of the skin if they are used for too long in the same area. Your physician has selected the right strength medicine for your problem and area affected on the body. Please use your medication only as directed by your physician to prevent side effects.   If you have any questions or concerns for your doctor, please call our main line at [phone] and press option 4 to reach your doctor's medical assistant. If no one answers, please leave a voicemail as directed and we will return your call as soon as possible. Messages left after 4 pm will be answered the following business day.   You may also send [place] a message via [place]. We typically respond to MyChart messages within 1-2 business days.  For prescription refills, please ask your pharmacy to contact our office. Our fax number is [phone].  If you have an urgent issue when the clinic is closed that cannot wait until the next business day, you can page your doctor at the number below.    Please note that while we do our best to be available for urgent issues outside of office hours, we are not available 24/7.   If you have an urgent issue and are unable to reach [place], you may choose to seek medical care at your doctor's office, retail clinic, urgent care center, or emergency room.  If you have a medical emergency, please immediately call 911 or go to the emergency department.  Pager Numbers  - Dr. Nehemiah Massed: [phone]  - Dr. Laurence Ferrari: [phone]  - Dr. Nicole Kindred: [phone]  In the event of inclement weather, please call our main line at [phone]  for an update on the status of any delays or closures.  Dermatology Medication Tips: Please keep the boxes that topical medications come in in order to help keep track of the instructions about where and how to use these. Pharmacies typically print the medication instructions only on the boxes and not directly on the medication tubes.   If your medication is too expensive, please contact our office at [phone] option 4 or send [place] a message through [place].   We are unable to tell what your co-pay for medications will be in advance as this is different depending on your insurance coverage. However, we may be able to find a substitute medication at lower cost or fill out paperwork to get insurance to cover a needed medication.   If a prior authorization is required to get your medication covered by your insurance company, please allow [place] 1-2 business days to complete this process.  Drug prices often vary depending on where the prescription is filled and some pharmacies may offer cheaper prices.  The website www.goodrx.com contains coupons for medications through different pharmacies. The prices here do not account for what the cost may be with help from insurance (it may be cheaper with your insurance), but the website can give you the price if you did not use any insurance.  - You can print the associated coupon and take it with your prescription to the pharmacy.  - You may also stop by our office during regular business hours and pick up a  GoodRx coupon card.  - If you need your prescription sent electronically to a different pharmacy, notify our office through [facility] or by phone at [phone] option 4.

## 2021-03-22 NOTE — Progress Notes (Signed)
   New Patient Visit  Subjective  Mary [name] is a 55 y.o. female who presents for the following: Psoriasis (Patient here today for psoriasis at inframammary and buttocks. She previously had psoriasis at scalp, elbows and hands. ). Patient does have an old prescription from our office, possibly [place].  The following portions of the chart were reviewed this encounter and updated as appropriate:   Tobacco  Allergies  Meds  Problems  Med Hx  Surg Hx  Fam Hx     Review of Systems:  No other skin or systemic complaints except as noted in HPI or Assessment and Plan.  Objective  Well appearing patient in no apparent distress; mood and affect are within normal limits.  A focused examination was performed including buttocks, abdomen, inframammary. Relevant physical exam findings are noted in the Assessment and Plan.  Objective  Abdomen, buttocks: Erythematous plaques with dyschromia   Images     Assessment & Plan  Psoriasis - with recent flare Abdomen, buttocks Start Enstilar foam 2 times daily x 2 weeks then decrease to once daily for 5 days a week to affected areas. Avoid applying to face, groin, and axilla. Use as directed. Risk of skin atrophy with long-term use reviewed.   If too expensive, consider sending in calcipotriene and topical steroid separately.   May consider X-trac laser; [name]; biologic injectables in future.  Psoriasis is a chronic non-curable, but treatable genetic/hereditary disease that may have other systemic features affecting other organ systems such as joints (Psoriatic Arthritis). It is associated with an increased risk of inflammatory bowel disease, heart disease, non-alcoholic fatty liver disease, and depression.    Ordered Medications: Calcipotriene-Betameth Diprop (ENSTILAR) 0.005-0.064 % FOAM  Return in about 6 weeks (around 05/03/2021) for Psoriasis.  [name], RMA, am acting as scribe for Sarina Ser, MD . Documentation: I have  reviewed the above documentation for accuracy and completeness, and I agree with the above.  Sarina Ser, MD

## 2021-03-25 ENCOUNTER — Telehealth: Payer: Self-pay

## 2021-03-25 NOTE — Telephone Encounter (Signed)
Patient's Mary [name] is too expensive for patient to purchase. Please advised of alternative?

## 2021-03-29 NOTE — Telephone Encounter (Signed)
Can send the 2 components of Enstilar separately. 1- Betamethasone lotion 2- Calcipotriene foam or cream Apply each twice daily (one on top of another) for 2 weeks then decrease to daily 5 days per week.  6 rf  Or  Send Delos Haring - which is a generic mix of above.

## 2021-03-30 ENCOUNTER — Emergency Department
Admission: EM | Admit: 2021-03-30 | Discharge: 2021-03-31 | Disposition: A | Discharge status: Home / Self Care | Payer: Medicare Other | Attending: Emergency Medicine | Admitting: Emergency Medicine

## 2021-03-30 ENCOUNTER — Emergency Department: Payer: Medicare Other

## 2021-03-30 ENCOUNTER — Other Ambulatory Visit: Payer: Self-pay

## 2021-03-30 DIAGNOSIS — E876 Hypokalemia: Secondary | ICD-10-CM | POA: Diagnosis not present

## 2021-03-30 DIAGNOSIS — R531 Weakness: Secondary | ICD-10-CM | POA: Diagnosis present

## 2021-03-30 DIAGNOSIS — E119 Type 2 diabetes mellitus without complications: Secondary | ICD-10-CM | POA: Insufficient documentation

## 2021-03-30 DIAGNOSIS — Z79899 Other long term (current) drug therapy: Secondary | ICD-10-CM | POA: Diagnosis not present

## 2021-03-30 DIAGNOSIS — Z8673 Personal history of transient ischemic attack (TIA), and cerebral infarction without residual deficits: Secondary | ICD-10-CM | POA: Insufficient documentation

## 2021-03-30 DIAGNOSIS — Z7901 Long term (current) use of anticoagulants: Secondary | ICD-10-CM | POA: Insufficient documentation

## 2021-03-30 DIAGNOSIS — I10 Essential (primary) hypertension: Secondary | ICD-10-CM | POA: Insufficient documentation

## 2021-03-30 DIAGNOSIS — Z87891 Personal history of nicotine dependence: Secondary | ICD-10-CM | POA: Insufficient documentation

## 2021-03-30 DIAGNOSIS — N3000 Acute cystitis without hematuria: Secondary | ICD-10-CM | POA: Diagnosis not present

## 2021-03-30 DIAGNOSIS — N3090 Cystitis, unspecified without hematuria: Secondary | ICD-10-CM

## 2021-03-30 LAB — COMPREHENSIVE METABOLIC PANEL
ALT: 17 U/L (ref 0–44)
AST: 25 U/L (ref 15–41)
Albumin: 3.5 g/dL (ref 3.5–5.0)
Alkaline Phosphatase: 71 U/L (ref 38–126)
Anion gap: 8 (ref 5–15)
BUN: 15 mg/dL (ref 6–20)
CO2: 26 mmol/L (ref 22–32)
Calcium: 8.6 mg/dL — ABNORMAL LOW (ref 8.9–10.3)
Chloride: 104 mmol/L (ref 98–111)
Creatinine, Ser: 1.2 mg/dL — ABNORMAL HIGH (ref 0.44–1.00)
GFR, Estimated: 53 mL/min — ABNORMAL LOW (ref >60)
Glucose, Bld: 116 mg/dL — ABNORMAL HIGH (ref 70–99)
Potassium: 2.9 mmol/L — ABNORMAL LOW (ref 3.5–5.1)
Sodium: 138 mmol/L (ref 135–145)
Total Bilirubin: 0.4 mg/dL (ref 0.3–1.2)
Total Protein: 8.1 g/dL (ref 6.5–8.1)

## 2021-03-30 LAB — CBC WITH DIFFERENTIAL/PLATELET
Abs Immature Granulocytes: 0.01 10*3/uL (ref 0.00–0.07)
Basophils Absolute: 0 10*3/uL (ref 0.0–0.1)
Basophils Relative: 1 %
Eosinophils Absolute: 0 10*3/uL (ref 0.0–0.5)
Eosinophils Relative: 1 %
HCT: 33 % — ABNORMAL LOW (ref 36.0–46.0)
Hemoglobin: 10.5 g/dL — ABNORMAL LOW (ref 12.0–15.0)
Immature Granulocytes: 0 %
Lymphocytes Relative: 27 %
Lymphs Abs: 0.7 10*3/uL (ref 0.7–4.0)
MCH: 22.1 pg — ABNORMAL LOW (ref 26.0–34.0)
MCHC: 31.8 g/dL (ref 30.0–36.0)
MCV: 69.3 fL — ABNORMAL LOW (ref 80.0–100.0)
Monocytes Absolute: 0.4 10*3/uL (ref 0.1–1.0)
Monocytes Relative: 15 %
Neutro Abs: 1.5 10*3/uL — ABNORMAL LOW (ref 1.7–7.7)
Neutrophils Relative %: 56 %
Platelets: 250 10*3/uL (ref 150–400)
RBC: 4.76 MIL/uL (ref 3.87–5.11)
RDW: 16.2 % — ABNORMAL HIGH (ref 11.5–15.5)
WBC: 2.6 10*3/uL — ABNORMAL LOW (ref 4.0–10.5)
nRBC: 0 % (ref 0.0–0.2)

## 2021-03-30 LAB — PROTIME-INR
INR: 2.4 — ABNORMAL HIGH (ref 0.8–1.2)
Prothrombin Time: 26 seconds — ABNORMAL HIGH (ref 11.4–15.2)

## 2021-03-30 IMAGING — CT CT HEAD W/O CM
3 series · 15 of 47 positions shown, 18 images · non-contrast
Comparison: [DATE]

CLINICAL DATA: Altered mental status.

EXAM:
CT HEAD WITHOUT CONTRAST
TECHNIQUE: Contiguous axial images were obtained from the base of the skull
through the vertex without intravenous contrast.

[Series 2: head wo · axial · 0.44mm/px · z∈[-176,-41]mm · 9 of 33 slices shown, 12 images]
[im 3/33  brain]
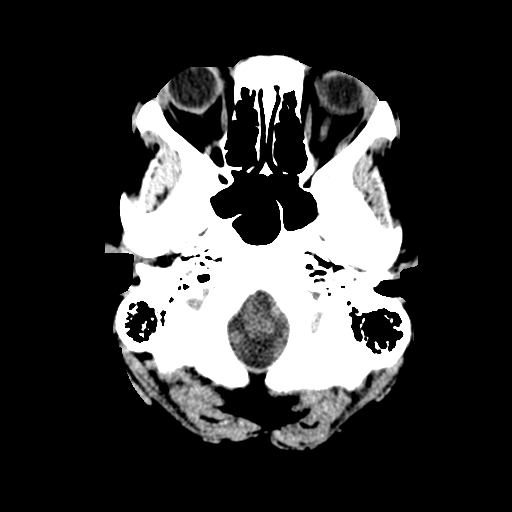
[im 3/33  bone]
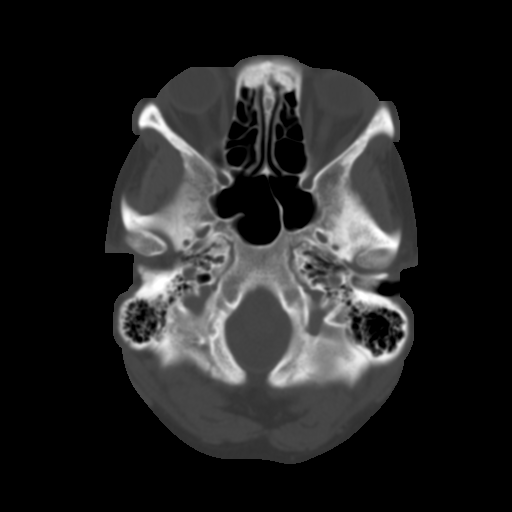
[im 6/33  brain]
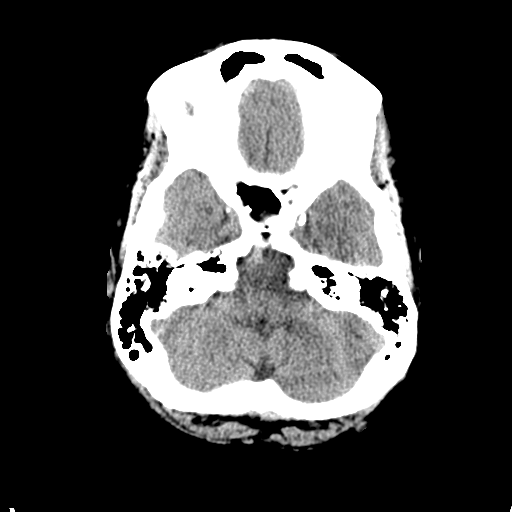
[im 9/33  brain]
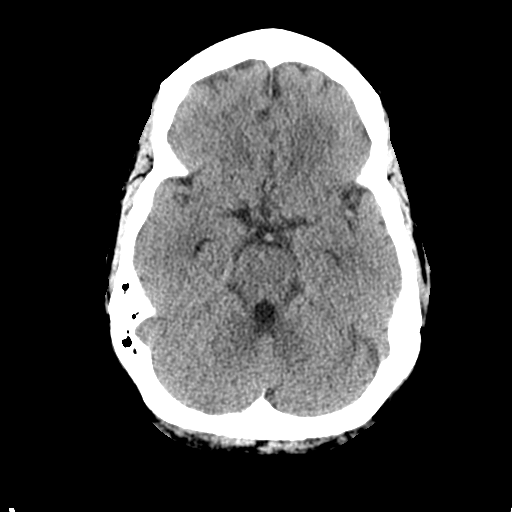
[im 13/33  brain]
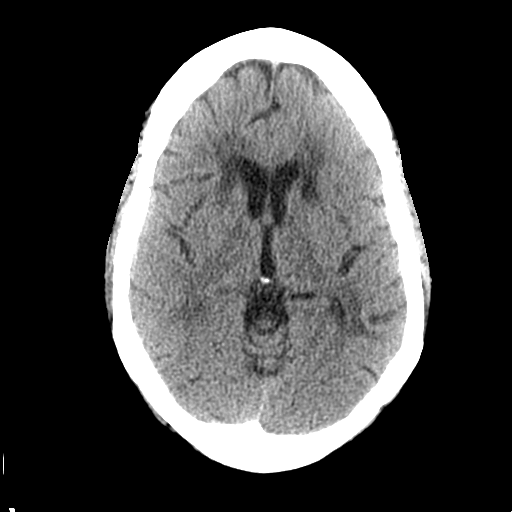
[im 17/33  brain]
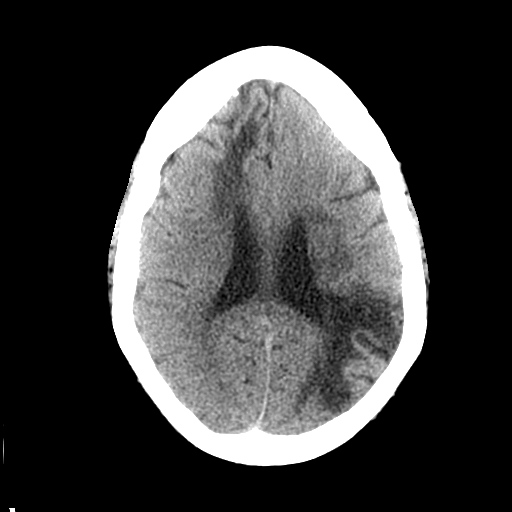
[im 17/33  bone]
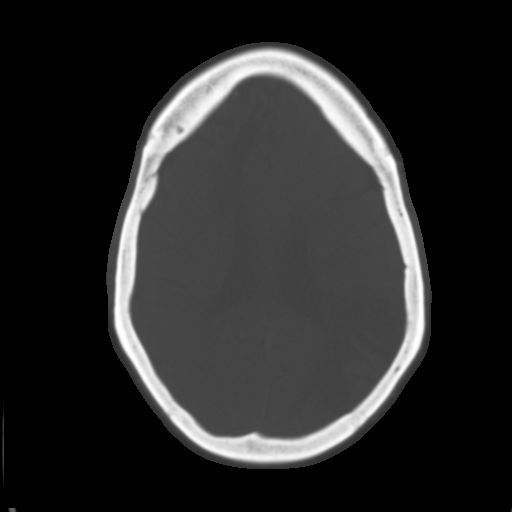
[im 20/33  brain]
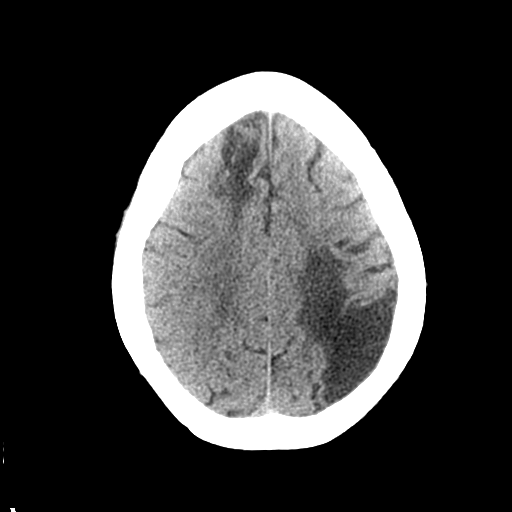
[im 24/33  brain]
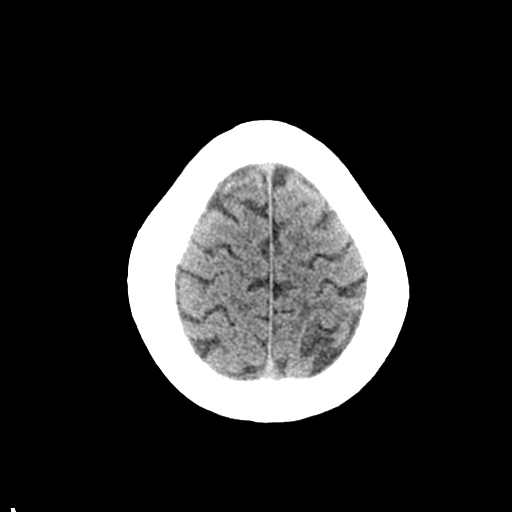
[im 27/33  brain]
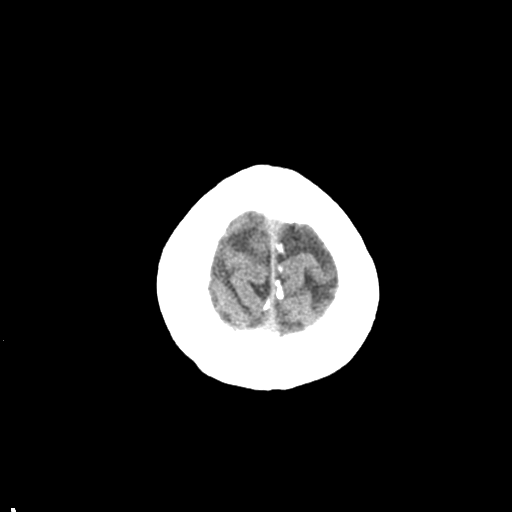
[im 30/33  brain]
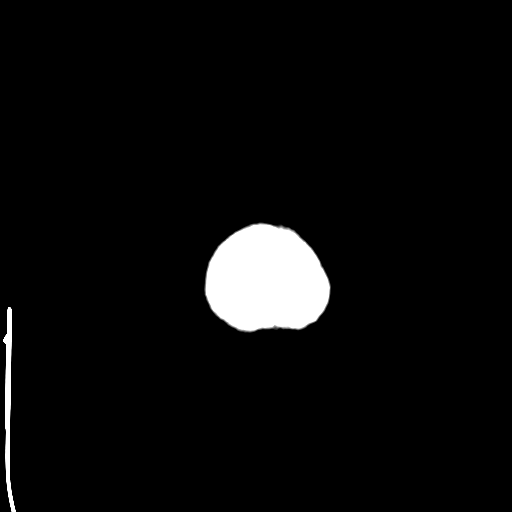
[im 30/33  bone]
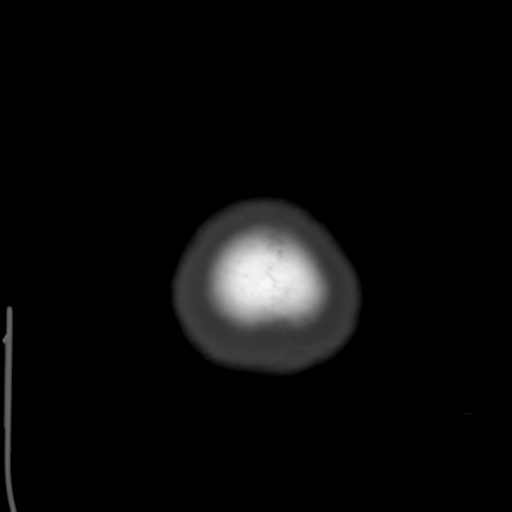

[Series 4: coronal soft tissue · coronal · 0.34mm/px · 3 of 68 slices shown]
[im 23/68  brain]
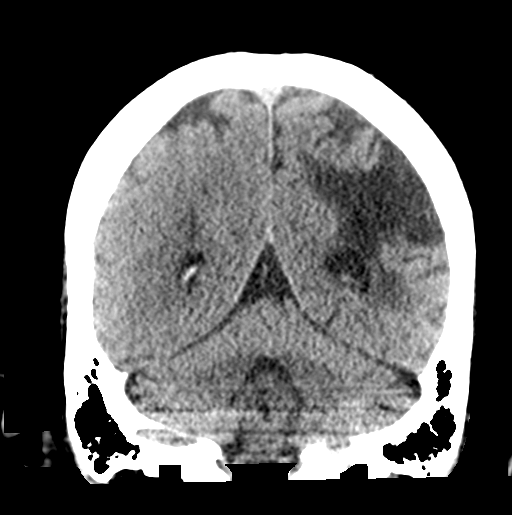
[im 30/68  brain]
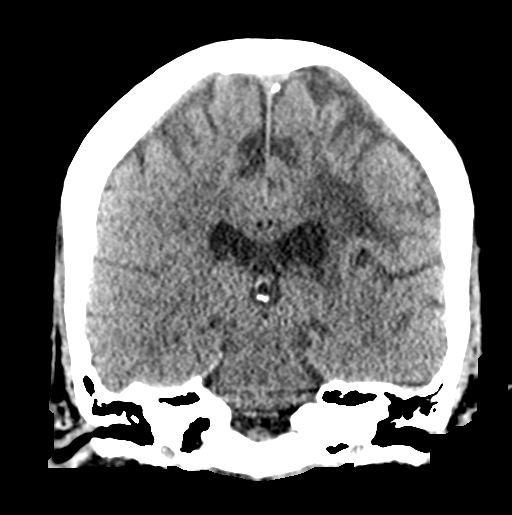
[im 38/68  brain]
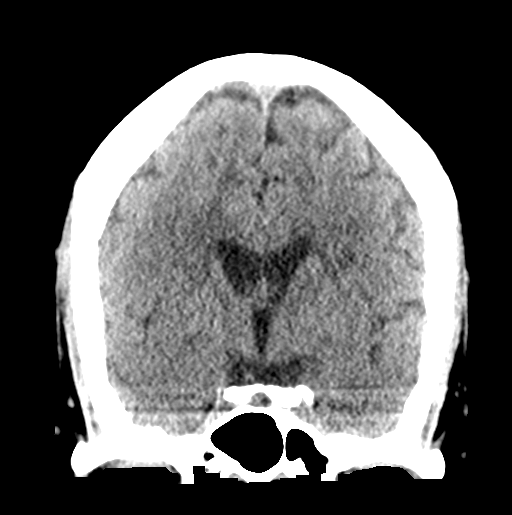

[Series 5: sagittal soft tissue · sagittal · 0.34mm/px · 3 of 56 slices shown]
[im 19/56  brain]
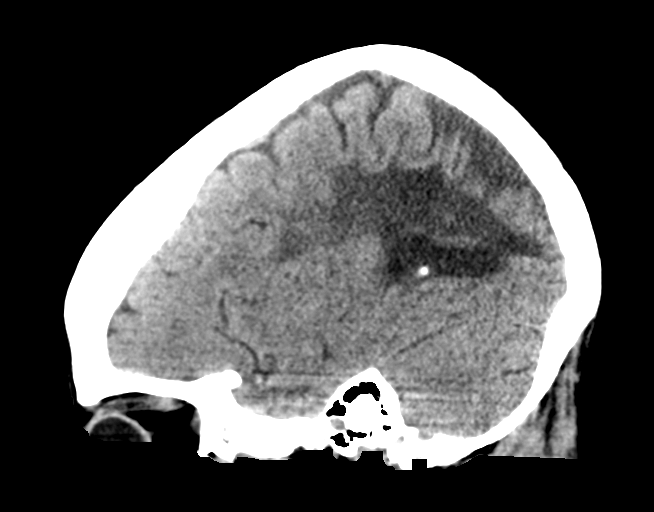
[im 28/56  brain]
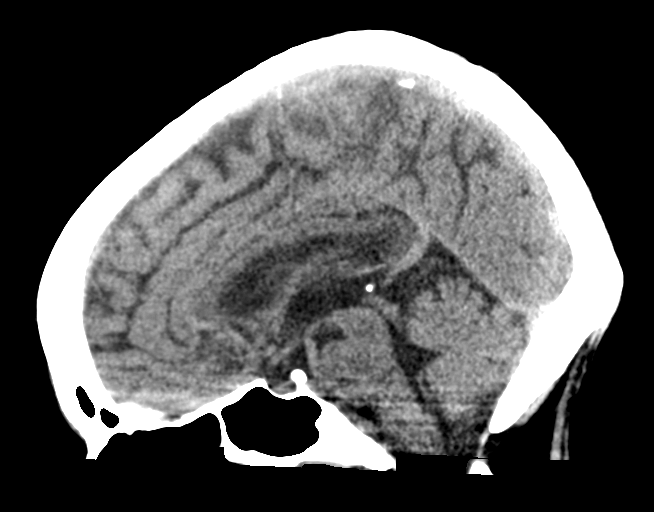
[im 37/56  brain]
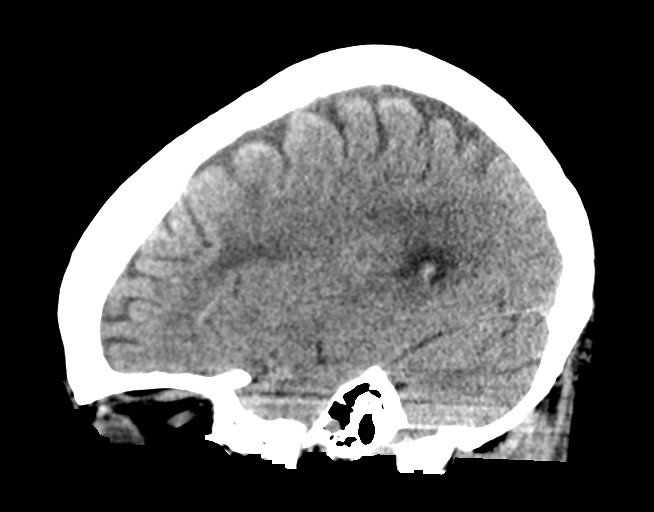

[15 of 47 positions shown; findings below may reference images not displayed]

FINDINGS: Brain: There is mild cerebral atrophy with widening of the
extra-axial spaces and ventricular dilatation.
There are areas of decreased attenuation within the white matter
tracts of the supratentorial brain, consistent with microvascular
disease changes.

Areas of cortical encephalomalacia, with adjacent chronic white
matter low attenuation, are seen within the right frontal and left
posterior parietal regions. A stable area of chronic infarct is seen
within the radiating white matter tracks anterior to the chronic
left parietal infarct.

Chronic bilateral basal ganglia lacunar infarcts are also seen.

Vascular: No hyperdense vessel or unexpected calcification.

Skull: Normal. Negative for fracture or focal lesion.

Sinuses/Orbits: No acute finding.

Other: None.
IMPRESSION: 1. Generalized cerebral atrophy.
2. Chronic bilateral basal ganglia, right ACA and left MCA
distribution infarcts.
3. No acute intracranial abnormality.

## 2021-03-30 MED ORDER — WYNZORA 0.005-0.064 % EX CREA: 1.0000 "application " | TOPICAL_CREAM | CUTANEOUS | 2 refills | Status: DC

## 2021-03-30 MED ORDER — LACTATED RINGERS IV BOLUS: 1000.0000 mL | Freq: Once | INTRAVENOUS | Status: DC

## 2021-03-30 MED ORDER — POTASSIUM CHLORIDE CRYS ER 20 MEQ PO TBCR
40.0000 meq | EXTENDED_RELEASE_TABLET | Freq: Once | ORAL | Status: AC
  Administered 2021-03-30: 40 meq via ORAL
  Filled 2021-03-30: qty 2

## 2021-03-30 NOTE — Telephone Encounter (Signed)
RX sent in and left message for patient to return my call. 

## 2021-03-30 NOTE — ED Notes (Signed)
Pt given large cup of water and encouraged to drink to rehydrate orally. Pt refusing covid test at this time.

## 2021-03-30 NOTE — ED Provider Notes (Signed)
[facility] Emergency Department Provider Note  ____________________________________________   Event Date/Time   First MD Initiated Contact with Patient 03/30/21 2258     (approximate)  I have reviewed the triage vital signs and the nursing notes.   HISTORY  Chief Complaint Weakness    HPI Mary [name] is a 55 y.o. female here with generalized weakness.  The patient states that she feels like for the last 2 days, she has been "a little bit off."  She is unable to fully elaborate this, but states that she has had some difficulty concentrating as well as occasional difficulty saying what she meant to say.  She has a history of stroke with aphasia, so she presents for evaluation.  She states that she has had somewhat persistent aphasia since the stroke but it has seemed somewhat worse over the last 2 days.  Denies any recent medication changes.  She is on Coumadin and has been taking this as prescribed.  She denies any fevers or chills.  No recent cough or shortness of breath.  No headache.  No focal numbness or weakness.  No vision changes.  No other complaints.        Past Medical History:  Diagnosis Date  . Blood transfusion without reported diagnosis    transfusion December 2016  . Clotting disorder (HCC)    left leg  . Diabetes mellitus without complication ([place])   . Hypertension   . Other and unspecified ovarian cysts   . Stroke ([place])   . Vaginal Pap smear, abnormal     Patient Active Problem List   Diagnosis Date Noted  . Aphasia as late effect of cerebrovascular accident 12/18/2019  . Porokeratosis 06/10/2019  . Plantar flexed metatarsal bone of left foot 06/10/2019  . ASCUS of cervix with negative high risk HPV 06/04/2019  . Sleep apnea 07/07/2016  . Anemia, iron deficiency 06/27/2016  . Diabetes mellitus ([place]) 03/09/2016  . Acute posthemorrhagic anemia   . OB + stool   . Benign neoplasm of ascending colon   . Sepsis ([place]) 11/09/2015   . Fecal occult blood test positive 11/09/2015  . Absolute anemia   . Cerebral infarction due to embolism of left middle cerebral artery ([place]) 07/16/2015  . Antiphospholipid syndrome ([place]) 11/17/2014  . Cryptogenic stroke ([place]) 10/08/2014  . Essential hypertension 10/08/2014  . Obesity 10/08/2014  . Vitamin D deficiency 09/08/2014  . Anemia, unspecified 09/08/2014  . Severe obesity (BMI >= 40) ([place]) 08/21/2013  . Acquired dyslexia 07/12/2013  . Aphasia due to stroke 07/12/2013  . Headache 07/12/2013  . 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 History of stroke 01/24/2013  . HTN (hypertension) 01/24/2013    Past Surgical History:  Procedure Laterality Date  . COLONOSCOPY WITH PROPOFOL N/A 11/10/2015   Procedure: COLONOSCOPY WITH PROPOFOL;  Surgeon: Lucilla Lame, MD;  Location: [facility] ENDOSCOPY;  Service: Endoscopy;  Laterality: N/A;  . ESOPHAGOGASTRODUODENOSCOPY (EGD) WITH PROPOFOL N/A 11/10/2015   Procedure: ESOPHAGOGASTRODUODENOSCOPY (EGD) WITH PROPOFOL;  Surgeon: Lucilla Lame, MD;  Location: [facility] ENDOSCOPY;  Service: Endoscopy;  Laterality: N/A;  . LAPAROSCOPIC GASTRIC SLEEVE RESECTION  02/17/2016   placement  . LOOP RECORDER IMPLANT N/A 04/25/2014   Procedure: LOOP RECORDER IMPLANT;  Surgeon: Coralyn Mark, MD;  Location: [facility] CATH [facility];  Service: Cardiovascular;  Laterality: N/A;  . LOOP RECORDER REMOVAL N/A 02/01/2018   Procedure: LOOP RECORDER REMOVAL;  Surgeon: Deboraha Sprang, MD;  Location: [place] CV LAB;  Service: Cardiovascular;  Laterality:  N/A;  . OVARIAN CYST REMOVAL    . TEE WITHOUT CARDIOVERSION N/A 01/25/2013   Procedure: TRANSESOPHAGEAL ECHOCARDIOGRAM (TEE);  Surgeon: Birdie Riddle, MD;  Location: [place];  Service: Cardiovascular;  Laterality: N/A;    Prior to Admission medications   Medication Sig Start Date End Date Taking? Authorizing Provider  cephALEXin (KEFLEX) 500 MG capsule Take 1 capsule (500 mg total) by mouth 2 (two) times daily for 5 days. 03/31/21  04/05/21 Yes Duffy Bruce, MD  potassium chloride (KLOR-CON) 10 MEQ tablet Take 1 tablet (10 mEq total) by mouth 2 (two) times daily for 3 days. 03/31/21 04/03/21 Yes Duffy Bruce, MD  amLODipine (NORVASC) 10 MG tablet Take 1 tablet (10 mg total) by mouth daily. 03/02/21   Mayers, Cari S, PA-C  atorvastatin (LIPITOR) 40 MG tablet Take 1 tablet (40 mg total) by mouth daily. 03/02/21   Mayers, Cari S, PA-C  Calcipotriene-Betameth Diprop (WYNZORA) 0.005-0.064 % CREA Apply 1 application topically as directed. Apply twice daily for two weeks then decrease once daily for 5 days. Avoid Face, groin and underarms. 03/30/21   Ralene Bathe, MD  fluticasone [facility]) 50 MCG/ACT nasal spray Place 2 sprays into both nostrils daily. 04/26/18   Argentina Donovan, PA-C  hydrochlorothiazide (HYDRODIURIL) 12.5 MG tablet Take 1 tablet (12.5 mg total) by mouth daily. 03/02/21   Mayers, Cari S, PA-C  metoprolol tartrate (LOPRESSOR) 25 MG tablet Take 1 tablet by mouth twice daily. 03/02/21   Mayers, Cari S, PA-C  warfarin (COUMADIN) 5 MG tablet TAKE 1/2 TO 1 (ONE-HALF TO ONE) TABLET BY MOUTH ONCE DAILY AS DIRECTED BY  COUMADIN  CLINIC 12/24/20   Minna Merritts, MD    Allergies Patient has no known allergies.  Family History  Problem Relation Age of Onset  . Diabetes Mellitus II Mother   . Diabetes Mother   . Stroke Mother   . Transient ischemic attack Father   . Stroke Father   . Stroke Other   . Diabetes Other   . Diabetes Sister     Social History Social History   Tobacco Use  . Smoking status: Former Smoker    Packs/day: 1.00    Years: 10.00    Pack years: 10.00    Types: Cigarettes    Quit date: 07/14/2006    Years since quitting: 14.7  . Smokeless tobacco: Never Used  . Tobacco comment: quit smoking around 2007  Vaping Use  . Vaping Use: Never used  Substance Use Topics  . Alcohol use: No    Alcohol/week: 0.0 standard drinks  . Drug use: No    Review of Systems  Review of Systems   Constitutional: Positive for fatigue. Negative for fever.  HENT: Negative for congestion and sore throat.   Eyes: Negative for visual disturbance.  Respiratory: Negative for cough and shortness of breath.   Cardiovascular: Negative for chest pain.  Gastrointestinal: Negative for abdominal pain, diarrhea, nausea and vomiting.  Genitourinary: Negative for flank pain.  Musculoskeletal: Negative for back pain and neck pain.  Skin: Negative for rash and wound.  Neurological: Negative for weakness.  Psychiatric/Behavioral: Positive for confusion.  All other systems reviewed and are negative.    ____________________________________________  PHYSICAL EXAM:      VITAL SIGNS: ED Triage Vitals  Enc Vitals Group     BP 03/30/21 2220 121/74     Pulse Rate 03/30/21 2220 74     Resp 03/30/21 2220 15     Temp 03/30/21 2222 99.1  F (37.3 C)     Temp Source 03/30/21 2222 Oral     SpO2 03/30/21 2220 96 %     Weight 03/30/21 2219 220 lb (99.8 kg)     Height 03/30/21 2219 5\' 5"  (1.651 m)     Head Circumference --      Peak Flow --      Pain Score 03/30/21 2219 0     Pain Loc --      Pain Edu? --      Excl. in [place]? --      Physical Exam Vitals and nursing note reviewed.  Constitutional:      General: She is not in acute distress.    Appearance: She is well-developed.  HENT:     Head: Normocephalic and atraumatic.  Eyes:     Conjunctiva/sclera: Conjunctivae normal.  Cardiovascular:     Rate and Rhythm: Normal rate and regular rhythm.     Heart sounds: Normal heart sounds. No murmur heard. No friction rub.  Pulmonary:     Effort: Pulmonary effort is normal. No respiratory distress.     Breath sounds: Normal breath sounds. No wheezing or rales.  Abdominal:     General: There is no distension.     Palpations: Abdomen is soft.     Tenderness: There is no abdominal tenderness.  Musculoskeletal:     Cervical back: Neck supple.  Skin:    General: Skin is warm.     Capillary Refill:  Capillary refill takes less than 2 seconds.  Neurological:     Mental Status: She is alert and oriented to person, place, and time.     Motor: No abnormal muscle tone.       ____________________________________________   LABS (all labs ordered are listed, but only abnormal results are displayed)  Labs Reviewed  CBC WITH DIFFERENTIAL/PLATELET - Abnormal; Notable for the following components:      Result Value   WBC 2.6 (*)    Hemoglobin 10.5 (*)    HCT 33.0 (*)    MCV 69.3 (*)    MCH 22.1 (*)    RDW 16.2 (*)    Neutro Abs 1.5 (*)    All other components within normal limits  COMPREHENSIVE METABOLIC PANEL - Abnormal; Notable for the following components:   Potassium 2.9 (*)    Glucose, Bld 116 (*)    Creatinine, Ser 1.20 (*)    Calcium 8.6 (*)    GFR, Estimated 53 (*)    All other components within normal limits  URINALYSIS, COMPLETE (UACMP) WITH MICROSCOPIC - Abnormal; Notable for the following components:   Color, Urine YELLOW (*)    APPearance HAZY (*)    Hgb urine dipstick SMALL (*)    Leukocytes,Ua LARGE (*)    All other components within normal limits  PROTIME-INR - Abnormal; Notable for the following components:   Prothrombin Time 26.0 (*)    INR 2.4 (*)    All other components within normal limits  RESP PANEL BY RT-PCR (FLU A&B, COVID) [id number]  URINE CULTURE    ____________________________________________  EKG: Normal sinus rhythm, jugular rate 73.  PR 170, QRS 84, QTc 426 but no acute ST elevations depression in acute evidence of acute ischemia 4. ________________________________________  RADIOLOGY All imaging, including plain films, CT scans, and ultrasounds, independently reviewed by me, and interpretations confirmed via formal radiology reads.  ED MD interpretation:   CT head: Negative  Official radiology report(s): CT Head Wo Contrast  Result Date: 03/30/2021  CLINICAL DATA:  Altered mental status. EXAM: CT HEAD WITHOUT CONTRAST TECHNIQUE:  Contiguous axial images were obtained from the base of the skull through the vertex without intravenous contrast. COMPARISON:  December 16, 2019 FINDINGS: Brain: There is mild cerebral atrophy with widening of the extra-axial spaces and ventricular dilatation. There are areas of decreased attenuation within the white matter tracts of the supratentorial brain, consistent with microvascular disease changes. Areas of cortical encephalomalacia, with adjacent chronic white matter low attenuation, are seen within the right frontal and left posterior parietal regions. A stable area of chronic infarct is seen within the radiating white matter tracks anterior to the chronic left parietal infarct. Chronic bilateral basal ganglia lacunar infarcts are also seen. Vascular: No hyperdense vessel or unexpected calcification. Skull: Normal. Negative for fracture or focal lesion. Sinuses/Orbits: No acute finding. Other: None. IMPRESSION: 1. Generalized cerebral atrophy. 2. Chronic bilateral basal ganglia, right ACA and left MCA distribution infarcts. 3. No acute intracranial abnormality. Electronically Signed   By: Virgina Norfolk M.D.   On: 03/30/2021 23:38    ____________________________________________  PROCEDURES   Procedure(s) performed (including Critical Care):  .1-3 Lead EKG Interpretation Performed by: Duffy Bruce, MD Authorized by: Duffy Bruce, MD     Interpretation: normal     ECG rate:  70-80   ECG rate assessment: normal     Rhythm: sinus rhythm     Ectopy: none     Conduction: normal   Comments:     Indication: Weakness    ____________________________________________  INITIAL IMPRESSION / MDM / [place] / ED COURSE  As part of my medical decision making, I reviewed the following data within the [place] notes reviewed and incorporated, Old chart reviewed, Notes from prior ED visits, and [place] Controlled Substance Database       *Mary [name] was evaluated in Emergency Department on 03/31/2021 for the symptoms described in the history of present illness. She was evaluated in the context of the global COVID-19 pandemic, which necessitated consideration that the patient might be at risk for infection with the SARS-CoV-2 virus that causes COVID-19. Institutional protocols and algorithms that pertain to the evaluation of patients at risk for COVID-19 are in a state of rapid change based on information released by regulatory bodies including the CDC and federal and state organizations. These policies and algorithms were followed during the patient's care in the ED.  Some ED evaluations and interventions may be delayed as a result of limited staffing during the pandemic.*     Medical Decision Making: Very well-appearing 55 year old female here with generalized weakness and possible slight exacerbation of her chronic stroke deficits.  Regarding her stroke deficits, she has no new neurological deficits.  She is therapeutic on her anticoagulation.  CT head shows old strokes with no acute stroke.  I suspect she may have recrudescence of her old symptoms in the setting of mild dehydration and hypokalemia as well as UTI.  She does have some urinary frequency.  She is not septic.  No signs of pyelonephritis.  Will replace her potassium, treat her UTI, and discharged with outpatient follow-up.  Given that she is already medically optimized for stroke with no new deficits, do not feel MRI indicated at this time.  ____________________________________________  FINAL CLINICAL IMPRESSION(S) / ED DIAGNOSES  Final diagnoses:  Generalized weakness  Hypokalemia  Cystitis     MEDICATIONS GIVEN DURING THIS VISIT:  Medications  potassium chloride SA (KLOR-CON) CR tablet 40 mEq (  40 mEq Oral Given 03/30/21 2342)  cephALEXin (KEFLEX) capsule 500 mg (500 mg Oral Given 03/31/21 0055)     ED Discharge Orders         Ordered    cephALEXin (KEFLEX) 500 MG  capsule  2 times daily        03/31/21 0050    potassium chloride (KLOR-CON) 10 MEQ tablet  2 times daily        03/31/21 0050           Note:  This document was prepared using Dragon voice recognition software and may include unintentional dictation errors.   Duffy Bruce, MD 03/31/21 [phone]

## 2021-03-30 NOTE — ED Triage Notes (Signed)
Pt states starting yesterday she has felt "off" pt states she hasnt been able to function right, pt asked to explain more and she says she cant because she doesn't know. Pt denies pain, pt states she has some weakness. Pt hx stroke 53yrs ago.

## 2021-03-30 NOTE — ED Provider Notes (Signed)
MSE was initiated and I personally evaluated the patient and placed orders (if any) at  10:46 PM on March 30, 2021.  The patient appears stable so that the remainder of the MSE may be completed by another provider.   Vladimir Crofts, MD 03/30/21 [phone]

## 2021-03-30 NOTE — ED Notes (Signed)
Patient transported to CT 

## 2021-03-31 DIAGNOSIS — N3000 Acute cystitis without hematuria: Secondary | ICD-10-CM | POA: Diagnosis not present

## 2021-03-31 LAB — URINALYSIS, COMPLETE (UACMP) WITH MICROSCOPIC
Bacteria, UA: NONE SEEN
Bilirubin Urine: NEGATIVE
Glucose, UA: NEGATIVE mg/dL
Ketones, ur: NEGATIVE mg/dL
Nitrite: NEGATIVE
Protein, ur: NEGATIVE mg/dL
Specific Gravity, Urine: 1.014 (ref 1.005–1.030)
pH: 5 (ref 5.0–8.0)

## 2021-03-31 MED ORDER — CEPHALEXIN 500 MG PO CAPS
500.0000 mg | ORAL_CAPSULE | Freq: Once | ORAL | Status: AC
  Administered 2021-03-31: 500 mg via ORAL
  Filled 2021-03-31: qty 1

## 2021-03-31 MED ORDER — POTASSIUM CHLORIDE ER 10 MEQ PO TBCR: 10.0000 meq | EXTENDED_RELEASE_TABLET | Freq: Two times a day (BID) | ORAL | 0 refills | Status: DC

## 2021-03-31 MED ORDER — CEPHALEXIN 500 MG PO CAPS: 500.0000 mg | ORAL_CAPSULE | Freq: Two times a day (BID) | ORAL | 0 refills | Status: AC

## 2021-04-01 ENCOUNTER — Ambulatory Visit: Payer: Managed Care, Other (non HMO) | Admitting: Physician Assistant

## 2021-04-01 ENCOUNTER — Other Ambulatory Visit: Payer: Self-pay

## 2021-04-01 VITALS — BP 111/67 | HR 63 | Temp 98.7°F | Resp 18 | Ht 65.0 in | Wt 222.0 lb

## 2021-04-01 DIAGNOSIS — D649 Anemia, unspecified: Secondary | ICD-10-CM

## 2021-04-01 DIAGNOSIS — I10 Essential (primary) hypertension: Secondary | ICD-10-CM

## 2021-04-01 DIAGNOSIS — E559 Vitamin D deficiency, unspecified: Secondary | ICD-10-CM | POA: Diagnosis not present

## 2021-04-01 DIAGNOSIS — Z1159 Encounter for screening for other viral diseases: Secondary | ICD-10-CM

## 2021-04-01 DIAGNOSIS — Z8673 Personal history of transient ischemic attack (TIA), and cerebral infarction without residual deficits: Secondary | ICD-10-CM

## 2021-04-01 LAB — URINE CULTURE: Culture: >=100000 — AB

## 2021-04-01 NOTE — Progress Notes (Signed)
Established Patient Office Visit  Subjective:  Patient ID: Mary [name], female    DOB: [birth year]  Age: 55 y.o. MRN: [id number]  CC:  Chief Complaint  Patient presents with  . Anxiety    Stress    HPI Mary [name] reports that she continues to have increased stress and anxiety, states that she feels fatigued, does not feel herself.  States that she was seen at the emergency department on March 30, 2021 with a chief complaint of weakness.  Hospital note  Medical Decision Making: Very well-appearing 55 year old female here with generalized weakness and possible slight exacerbation of her chronic stroke deficits.  Regarding her stroke deficits, she has no new neurological deficits.  She is therapeutic on her anticoagulation.  CT head shows old strokes with no acute stroke.  I suspect she may have recrudescence of her old symptoms in the setting of mild dehydration and hypokalemia as well as UTI.  She does have some urinary frequency.  She is not septic.  No signs of pyelonephritis.  Will replace her potassium, treat her UTI, and discharged with outpatient follow-up.  Given that she is already medically optimized for stroke with no new deficits, do not feel MRI indicated at this time.   Patient states today that she is not sure if she is taking the potassium on a daily basis, states that she does remain compliant to her blood pressure medications.  States that she did start the antibiotic for her UTI.   Past Medical History:  Diagnosis Date  . Blood transfusion without reported diagnosis    transfusion December 2016  . Clotting disorder (HCC)    left leg  . Diabetes mellitus without complication ([place])   . Hypertension   . Other and unspecified ovarian cysts   . Stroke ([place])   . Vaginal Pap smear, abnormal     Past Surgical History:  Procedure Laterality Date  . COLONOSCOPY WITH PROPOFOL N/A 11/10/2015   Procedure: COLONOSCOPY WITH PROPOFOL;  Surgeon: Lucilla Lame, MD;   Location: [facility] ENDOSCOPY;  Service: Endoscopy;  Laterality: N/A;  . ESOPHAGOGASTRODUODENOSCOPY (EGD) WITH PROPOFOL N/A 11/10/2015   Procedure: ESOPHAGOGASTRODUODENOSCOPY (EGD) WITH PROPOFOL;  Surgeon: Lucilla Lame, MD;  Location: [facility] ENDOSCOPY;  Service: Endoscopy;  Laterality: N/A;  . LAPAROSCOPIC GASTRIC SLEEVE RESECTION  02/17/2016   placement  . LOOP RECORDER IMPLANT N/A 04/25/2014   Procedure: LOOP RECORDER IMPLANT;  Surgeon: Coralyn Mark, MD;  Location: [place] CATH LAB;  Service: Cardiovascular;  Laterality: N/A;  . LOOP RECORDER REMOVAL N/A 02/01/2018   Procedure: LOOP RECORDER REMOVAL;  Surgeon: Deboraha Sprang, MD;  Location: [place] CV LAB;  Service: Cardiovascular;  Laterality: N/A;  . OVARIAN CYST REMOVAL    . TEE WITHOUT CARDIOVERSION N/A 01/25/2013   Procedure: TRANSESOPHAGEAL ECHOCARDIOGRAM (TEE);  Surgeon: Birdie Riddle, MD;  Location: [facility] ENDOSCOPY;  Service: Cardiovascular;  Laterality: N/A;    Family History  Problem Relation Age of Onset  . Diabetes Mellitus II Mother   . Diabetes Mother   . Stroke Mother   . Transient ischemic attack Father   . Stroke Father   . Stroke Other   . Diabetes Other   . Diabetes Sister     Social History   Socioeconomic History  . Marital status: Single    Spouse name: Not on file  . Number of children: 2  . Years of education: 20  . Highest education level: Not on file  Occupational History  . Not on  file  Tobacco Use  . Smoking status: Former Smoker    Packs/day: 1.00    Years: 10.00    Pack years: 10.00    Types: Cigarettes    Quit date: 07/14/2006    Years since quitting: 14.7  . Smokeless tobacco: Never Used  . Tobacco comment: quit smoking around 2007  Vaping Use  . Vaping Use: Never used  Substance and Sexual Activity  . Alcohol use: No    Alcohol/week: 0.0 standard drinks  . Drug use: No  . Sexual activity: Yes  Other Topics Concern  . Not on file  Social History Narrative   Patient is single with two  children.   Patient is right handed.   Patient has 13 yrs of education.   Patient drinks 5 sodas daily.   Social Determinants of Health   Financial Resource Strain: Not on file  Food Insecurity: Not on file  Transportation Needs: Not on file  Physical Activity: Not on file  Stress: Not on file  Social Connections: Not on file  Intimate Partner Violence: Not on file    Outpatient Medications Prior to Visit  Medication Sig Dispense Refill  . amLODipine (NORVASC) 10 MG tablet Take 1 tablet (10 mg total) by mouth daily. 60 tablet 0  . atorvastatin (LIPITOR) 40 MG tablet Take 1 tablet (40 mg total) by mouth daily. 60 tablet 0  . Calcipotriene-Betameth Diprop (WYNZORA) 0.005-0.064 % CREA Apply 1 application topically as directed. Apply twice daily for two weeks then decrease once daily for 5 days. Avoid Face, groin and underarms. 60 g 2  . cephALEXin (KEFLEX) 500 MG capsule Take 1 capsule (500 mg total) by mouth 2 (two) times daily for 5 days. 10 capsule 0  . fluticasone (FLONASE) 50 MCG/ACT nasal spray Place 2 sprays into both nostrils daily. 16 g 6  . hydrochlorothiazide (HYDRODIURIL) 12.5 MG tablet Take 1 tablet (12.5 mg total) by mouth daily. 60 tablet 0  . metoprolol tartrate (LOPRESSOR) 25 MG tablet Take 1 tablet by mouth twice daily. 60 tablet 0  . potassium chloride (KLOR-CON) 10 MEQ tablet Take 1 tablet (10 mEq total) by mouth 2 (two) times daily for 3 days. 6 tablet 0  . warfarin (COUMADIN) 5 MG tablet TAKE 1/2 TO 1 (ONE-HALF TO ONE) TABLET BY MOUTH ONCE DAILY AS DIRECTED BY  COUMADIN  CLINIC 90 tablet 1   No facility-administered medications prior to visit.    No Known Allergies  ROS Review of Systems  Constitutional: Positive for fatigue. Negative for chills and fever.  HENT: Negative.   Eyes: Negative.   Respiratory: Negative for shortness of breath.   Cardiovascular: Negative for chest pain.  Gastrointestinal: Negative for abdominal pain, nausea and vomiting.   Endocrine: Negative.   Genitourinary: Negative for dysuria and hematuria.  Musculoskeletal: Negative.   Skin: Negative.   Allergic/Immunologic: Negative.   Neurological: Negative for dizziness, weakness and headaches.  Hematological: Negative.   Psychiatric/Behavioral: Negative for self-injury, sleep disturbance and suicidal ideas. The patient is nervous/anxious.       Objective:    Physical Exam Vitals and nursing note reviewed.  Constitutional:      General: She is not in acute distress.    Appearance: Normal appearance. She is not ill-appearing.  HENT:     Head: Normocephalic and atraumatic.     Right Ear: External ear normal.     Left Ear: External ear normal.     Nose: Nose normal.     Mouth/Throat:  Mouth: Mucous membranes are moist.     Pharynx: Oropharynx is clear.  Eyes:     Extraocular Movements: Extraocular movements intact.     Conjunctiva/sclera: Conjunctivae normal.     Pupils: Pupils are equal, round, and reactive to light.  Cardiovascular:     Rate and Rhythm: Normal rate and regular rhythm.     Pulses: Normal pulses.     Heart sounds: Normal heart sounds.  Pulmonary:     Effort: Pulmonary effort is normal.     Breath sounds: Normal breath sounds.  Musculoskeletal:        General: Normal range of motion.     Cervical back: Normal range of motion and neck supple.  Skin:    General: Skin is warm and dry.  Neurological:     General: No focal deficit present.     Mental Status: She is alert and oriented to person, place, and time.  Psychiatric:        Mood and Affect: Mood normal.        Behavior: Behavior normal.        Thought Content: Thought content normal.        Judgment: Judgment normal.     BP 111/67 (BP Location: Left Arm, Patient Position: Sitting, Cuff Size: Large)   Pulse 63   Temp 98.7 F (37.1 C) (Oral)   Resp 18   Ht 5\' 5"  (1.651 m)   Wt 222 lb (100.7 kg)   SpO2 98%   BMI 36.94 kg/m  Wt Readings from Last 3 Encounters:   04/01/21 222 lb (100.7 kg)  03/30/21 220 lb (99.8 kg)  03/02/21 227 lb (103 kg)     Health Maintenance Due  Topic Date Due  . PNEUMOCOCCAL POLYSACCHARIDE VACCINE AGE 46-64 HIGH RISK  Never done  . OPHTHALMOLOGY EXAM  Never done  . FOOT EXAM  06/09/2020  . COLONOSCOPY (Pts 45-33yrs Insurance coverage will need to be confirmed)  11/09/2020  . MAMMOGRAM  11/26/2020  . URINE MICROALBUMIN  03/25/2021    There are no preventive care reminders to display for this patient.  Lab Results  Component Value Date   TSH 1.16 05/09/2016   Lab Results  Component Value Date   WBC 2.6 (L) 03/30/2021   HGB 10.5 (L) 03/30/2021   HCT 33.0 (L) 03/30/2021   MCV 69.3 (L) 03/30/2021   PLT 250 03/30/2021   Lab Results  Component Value Date   NA 138 04/01/2021   K 3.5 04/01/2021   CO2 26 03/30/2021   GLUCOSE 90 04/01/2021   BUN 28 (H) 04/01/2021   CREATININE 2.36 (H) 04/01/2021   BILITOT <0.2 04/01/2021   ALKPHOS 90 04/01/2021   AST 32 04/01/2021   ALT 17 03/30/2021   PROT 8.5 04/01/2021   ALBUMIN 4.1 04/01/2021   CALCIUM 9.0 04/01/2021   ANIONGAP 8 03/30/2021   Lab Results  Component Value Date   CHOL 129 04/01/2021   Lab Results  Component Value Date   HDL 37 (L) 04/01/2021   Lab Results  Component Value Date   LDLCALC 70 04/01/2021   Lab Results  Component Value Date   TRIG 121 04/01/2021   Lab Results  Component Value Date   CHOLHDL 3.5 04/01/2021   Lab Results  Component Value Date   HGBA1C 5.9 (A) 03/02/2021      Assessment & Plan:   Problem List Items Addressed This Visit      Cardiovascular and Mediastinum   Essential hypertension  Other   History of stroke   Vitamin D deficiency   Anemia, unspecified - Primary   Relevant Orders   Iron, TIBC and Ferritin Panel (Completed)    Other Visit Diagnoses    Encounter for HCV screening test for low risk patient        1. Anemia, unspecified type Patient was last seen on the mobile unit on March 02, 2021, at that time she was encouraged to return to the mobile unit for fasting labs.  Fasting labs completed today.  Patient did have decreased MCV noted on ED visit.  Patient encouraged to continue antibiotic regimen, continue potassium as directed, increase hydration.  Patient to return to the mobile unit for further evaluation on Tuesday April 06, 2021.  Red flags given for prompt reevaluation.  - Iron, TIBC and Ferritin Panel  2. Vitamin D deficiency  - Vitamin D, 25-hydroxy  3. Encounter for HCV screening test for low risk patient  - HCV Ab w/Rflx to Verification  4. History of stroke  - Lipid panel  5. Essential hypertension  - Comp. Metabolic Panel (12)     I have reviewed the patient's medical history (PMH, PSH, Social History, Family History, Medications, and allergies) , and have been updated if relevant. I spent 30 minutes reviewing chart and  face to face time with patient.    No orders of the defined types were placed in this encounter.   Follow-up: Return in about 5 days (around 04/06/2021).    Loraine Grip Mayers, PA-C

## 2021-04-01 NOTE — Progress Notes (Signed)
Patient has taken medication and patient has has soda today. Patient denies pain at this time. Patient reports "feeling off" but began an antibiotic yesterday for UTI and has not eaten today.

## 2021-04-01 NOTE — Patient Instructions (Signed)
I encourage you to take your potassium as directed, make sure that you are staying very well-hydrated.  Please return to the mobile unit on Tuesday, April 19 to review your lab results and further evaluation.  Kennieth Rad, PA-C Physician Assistant [place] [email or web address]    Hypokalemia Hypokalemia means that the amount of potassium in the blood is lower than normal. Potassium is a chemical (electrolyte) that helps regulate the amount of fluid in the body. It also stimulates muscle tightening (contraction) and helps nerves work properly. Normally, most of the body's potassium is inside cells, and only a very small amount is in the blood. Because the amount in the blood is so small, minor changes to potassium levels in the blood can be life-threatening. What are the causes? This condition may be caused by:  Antibiotic medicine.  Diarrhea or vomiting. Taking too much of a medicine that helps you have a bowel movement (laxative) can cause diarrhea and lead to hypokalemia.  Chronic kidney disease (CKD).  Medicines that help the body get rid of excess fluid (diuretics).  Eating disorders, such as bulimia.  Low magnesium levels in the body.  Sweating a lot. What are the signs or symptoms? Symptoms of this condition include:  Weakness.  Constipation.  Fatigue.  Muscle cramps.  Mental confusion.  Skipped heartbeats or irregular heartbeat (palpitations).  Tingling or numbness. How is this diagnosed? This condition is diagnosed with a blood test. How is this treated? This condition may be treated by:  Taking potassium supplements by mouth.  Adjusting the medicines that you take.  Eating more foods that contain a lot of potassium. If your potassium level is very low, you may need to get potassium through an IV and be monitored in the hospital. Follow these instructions at home:  Take over-the-counter and  prescription medicines only as told by your health care provider. This includes vitamins and supplements.  Eat a healthy diet. A healthy diet includes fresh fruits and vegetables, whole grains, healthy fats, and lean proteins.  If instructed, eat more foods that contain a lot of potassium. This includes: ? Nuts, such as peanuts and pistachios. ? Seeds, such as sunflower seeds and pumpkin seeds. ? Peas, lentils, and lima beans. ? Whole grain and bran cereals and breads. ? Fresh fruits and vegetables, such as apricots, avocado, bananas, cantaloupe, kiwi, oranges, tomatoes, asparagus, and potatoes. ? Orange juice. ? Tomato juice. ? Red meats. ? Yogurt.  Keep all follow-up visits as told by your health care provider. This is important.   Contact a health care provider if you:  Have weakness that gets worse.  Feel your heart pounding or racing.  Vomit.  Have diarrhea.  Have diabetes (diabetes mellitus) and you have trouble keeping your blood sugar (glucose) in your target range. Get help right away if you:  Have chest pain.  Have shortness of breath.  Have vomiting or diarrhea that lasts for more than 2 days.  Faint. Summary  Hypokalemia means that the amount of potassium in the blood is lower than normal.  This condition is diagnosed with a blood test.  Hypokalemia may be treated by taking potassium supplements, adjusting the medicines that you take, or eating more foods that are high in potassium.  If your potassium level is very low, you may need to get potassium through an IV and be monitored in the hospital. This information is not intended to replace advice given to you by your health care provider. Make sure  you discuss any questions you have with your health care provider. Document Revised: 07/18/2018 Document Reviewed: 07/18/2018 Elsevier Patient Education  [place].

## 2021-04-02 ENCOUNTER — Encounter: Payer: Self-pay | Admitting: Physician Assistant

## 2021-04-02 DIAGNOSIS — Z20822 Contact with and (suspected) exposure to covid-19: Secondary | ICD-10-CM | POA: Diagnosis not present

## 2021-04-02 DIAGNOSIS — Z03818 Encounter for observation for suspected exposure to other biological agents ruled out: Secondary | ICD-10-CM | POA: Diagnosis not present

## 2021-04-02 LAB — COMP. METABOLIC PANEL (12)
AST: 32 IU/L (ref 0–40)
Albumin/Globulin Ratio: 0.9 — ABNORMAL LOW (ref 1.2–2.2)
Albumin: 4.1 g/dL (ref 3.8–4.9)
Alkaline Phosphatase: 90 IU/L (ref 44–121)
BUN/Creatinine Ratio: 12 (ref 9–23)
BUN: 28 mg/dL — ABNORMAL HIGH (ref 6–24)
Bilirubin Total: <0.2 mg/dL (ref 0.0–1.2)
Calcium: 9 mg/dL (ref 8.7–10.2)
Chloride: 104 mmol/L (ref 96–106)
Creatinine, Ser: 2.36 mg/dL — ABNORMAL HIGH (ref 0.57–1.00)
Globulin, Total: 4.4 g/dL (ref 1.5–4.5)
Glucose: 90 mg/dL (ref 65–99)
Potassium: 3.5 mmol/L (ref 3.5–5.2)
Sodium: 138 mmol/L (ref 134–144)
Total Protein: 8.5 g/dL (ref 6.0–8.5)
eGFR: 24 mL/min/{1.73_m2} — ABNORMAL LOW (ref >59)

## 2021-04-02 LAB — HCV INTERPRETATION

## 2021-04-02 LAB — IRON,TIBC AND FERRITIN PANEL
Ferritin: 22 ng/mL (ref 15–150)
Iron Saturation: 6 % — CL (ref 15–55)
Iron: 23 ug/dL — ABNORMAL LOW (ref 27–159)
Total Iron Binding Capacity: 398 ug/dL (ref 250–450)
UIBC: 375 ug/dL (ref 131–425)

## 2021-04-02 LAB — LIPID PANEL
Chol/HDL Ratio: 3.5 ratio (ref 0.0–4.4)
Cholesterol, Total: 129 mg/dL (ref 100–199)
HDL: 37 mg/dL — ABNORMAL LOW (ref >39)
LDL Chol Calc (NIH): 70 mg/dL (ref 0–99)
Triglycerides: 121 mg/dL (ref 0–149)
VLDL Cholesterol Cal: 22 mg/dL (ref 5–40)

## 2021-04-02 LAB — VITAMIN D 25 HYDROXY (VIT D DEFICIENCY, FRACTURES): Vit D, 25-Hydroxy: 43.7 ng/mL (ref 30.0–100.0)

## 2021-04-02 LAB — HCV AB W/RFLX TO VERIFICATION: HCV Ab: <0.1 s/co ratio (ref 0.0–0.9)

## 2021-04-06 ENCOUNTER — Other Ambulatory Visit: Payer: Self-pay | Admitting: Physician Assistant

## 2021-04-06 ENCOUNTER — Telehealth: Payer: Self-pay | Admitting: *Deleted

## 2021-04-06 DIAGNOSIS — R7989 Other specified abnormal findings of blood chemistry: Secondary | ICD-10-CM

## 2021-04-06 NOTE — Telephone Encounter (Signed)
Patient verified DOB Patient is aware of levels doubling within 24hrs and needing to be reevaluated in the emergency room. Patient shares apologetically that she is unable to visit the ED but is able to have levels rechecked at PCP clinic lab. Patient scheduled for 3:30 lab appointment. Provider aware of patients decision and has put in follow up labs. Covering provider will review labs with patient once resulted.

## 2021-04-06 NOTE — Telephone Encounter (Signed)
-----   Message from Kennieth Rad, [place] sent at 04/05/2021 10:04 AM EDT ----- Please call patient and let her know that she does need to be evaluated urgently for her kidney function, I left her a message on Friday April 15 regarding this result as well as sent her a Pharmacist, community message.  Please encourage her to follow-up promptly after being evaluated for her kidney function with either her PCP or return to the mobile unit

## 2021-04-06 NOTE — Progress Notes (Signed)
Please see nursing note from telephone encounter on 04/06/2021 at 5:12 PM  Kennieth Rad, PA-C Physician Assistant [facility] Mobile Medicine [email or web address]

## 2021-04-07 ENCOUNTER — Other Ambulatory Visit: Payer: Self-pay

## 2021-04-07 ENCOUNTER — Ambulatory Visit: Payer: Managed Care, Other (non HMO) | Attending: Family Medicine

## 2021-04-07 DIAGNOSIS — I10 Essential (primary) hypertension: Secondary | ICD-10-CM

## 2021-04-07 DIAGNOSIS — R7989 Other specified abnormal findings of blood chemistry: Secondary | ICD-10-CM | POA: Diagnosis not present

## 2021-04-08 LAB — CBC WITH DIFFERENTIAL/PLATELET
Basophils Absolute: 0 10*3/uL (ref 0.0–0.2)
Basos: 1 %
EOS (ABSOLUTE): 0.2 10*3/uL (ref 0.0–0.4)
Eos: 4 %
Hematocrit: 33.2 % — ABNORMAL LOW (ref 34.0–46.6)
Hemoglobin: 10.7 g/dL — ABNORMAL LOW (ref 11.1–15.9)
Immature Grans (Abs): 0 10*3/uL (ref 0.0–0.1)
Immature Granulocytes: 0 %
Lymphocytes Absolute: 2.1 10*3/uL (ref 0.7–3.1)
Lymphs: 50 %
MCH: 22.4 pg — ABNORMAL LOW (ref 26.6–33.0)
MCHC: 32.2 g/dL (ref 31.5–35.7)
MCV: 70 fL — ABNORMAL LOW (ref 79–97)
Monocytes Absolute: 0.3 10*3/uL (ref 0.1–0.9)
Monocytes: 6 %
Neutrophils Absolute: 1.7 10*3/uL (ref 1.4–7.0)
Neutrophils: 39 %
Platelets: 310 10*3/uL (ref 150–450)
RBC: 4.78 x10E6/uL (ref 3.77–5.28)
RDW: 17.2 % — ABNORMAL HIGH (ref 11.7–15.4)
WBC: 4.2 10*3/uL (ref 3.4–10.8)

## 2021-04-08 LAB — BASIC METABOLIC PANEL
BUN/Creatinine Ratio: 15 (ref 9–23)
BUN: 18 mg/dL (ref 6–24)
CO2: 21 mmol/L (ref 20–29)
Calcium: 8.4 mg/dL — ABNORMAL LOW (ref 8.7–10.2)
Chloride: 106 mmol/L (ref 96–106)
Creatinine, Ser: 1.18 mg/dL — ABNORMAL HIGH (ref 0.57–1.00)
Glucose: 113 mg/dL — ABNORMAL HIGH (ref 65–99)
Potassium: 3.6 mmol/L (ref 3.5–5.2)
Sodium: 142 mmol/L (ref 134–144)
eGFR: 55 mL/min/{1.73_m2} — ABNORMAL LOW (ref >59)

## 2021-04-12 ENCOUNTER — Ambulatory Visit: Payer: Managed Care, Other (non HMO) | Attending: Family Medicine | Admitting: Family Medicine

## 2021-04-12 ENCOUNTER — Other Ambulatory Visit: Payer: Self-pay

## 2021-04-12 ENCOUNTER — Encounter: Payer: Self-pay | Admitting: *Deleted

## 2021-04-12 ENCOUNTER — Encounter: Payer: Self-pay | Admitting: Family Medicine

## 2021-04-12 VITALS — BP 119/72 | HR 71 | Resp 18 | Ht 65.0 in | Wt 231.0 lb

## 2021-04-12 DIAGNOSIS — D509 Iron deficiency anemia, unspecified: Secondary | ICD-10-CM

## 2021-04-12 DIAGNOSIS — D6861 Antiphospholipid syndrome: Secondary | ICD-10-CM

## 2021-04-12 DIAGNOSIS — E1159 Type 2 diabetes mellitus with other circulatory complications: Secondary | ICD-10-CM | POA: Diagnosis not present

## 2021-04-12 DIAGNOSIS — I152 Hypertension secondary to endocrine disorders: Secondary | ICD-10-CM

## 2021-04-12 DIAGNOSIS — Z8673 Personal history of transient ischemic attack (TIA), and cerebral infarction without residual deficits: Secondary | ICD-10-CM | POA: Diagnosis not present

## 2021-04-12 DIAGNOSIS — Z1211 Encounter for screening for malignant neoplasm of colon: Secondary | ICD-10-CM | POA: Diagnosis not present

## 2021-04-12 MED ORDER — IRON (FERROUS SULFATE) 325 (65 FE) MG PO TABS: 325.0000 mg | ORAL_TABLET | Freq: Every day | ORAL | 3 refills | Status: AC

## 2021-04-12 NOTE — Progress Notes (Signed)
Established Patient Office Visit  Subjective:  Patient ID: Mary [name], female    DOB: 13-May-1966  Age: 55 y.o. MRN: [id number]  CC:  Chief Complaint  Patient presents with  . Gynecologic Exam  . Referral for Neurology    HPI Mary [name] is a 55 year old female with a PMH  of type 2 diabetes mellitus (diet controlled with A1c of 5.9 on 3/15) antiphospholipid syndrome (on chronic anticoagulation with Coumadin, followed by the Coumadin clinic), previous stroke with residual aphasia and hypertension here for a follow up visit.   She recently had an ED visit on 4/12 due to generalized weakness and confusion. A CT scan was done which revealed no new neurological abnormalities. Her urinalysis revealed a urinary tract infection which she completed the course of antibiotics for.There have been no urinary changes or difficulties recently.  She reports this weakness has resolved but feels tired at today's visit due to recently starting a new job that is more demanding.She would like to see neurology due to this episode of weakness and confusion.  On her last two labs 4/12 and 4/20 she was found to have an iron-deficiency anemia. She denies any shortness of breath or orthostatic hypotension. She does explain that she occasionally will have a small bit of blood in her stools but attributes this to having hemorrhoids.   In regards to her diabetes, it is diet controlled with the last A1c on 3/15 being 5.9.    Past Medical History:  Diagnosis Date  . Blood transfusion without reported diagnosis    transfusion December 2016  . Clotting disorder (HCC)    left leg  . Diabetes mellitus without complication ([place])   . Hypertension   . Other and unspecified ovarian cysts   . Stroke ([place])   . Vaginal Pap smear, abnormal     Past Surgical History:  Procedure Laterality Date  . COLONOSCOPY WITH PROPOFOL N/A 11/10/2015   Procedure: COLONOSCOPY WITH PROPOFOL;  Surgeon: Lucilla Lame, MD;   Location: [facility] ENDOSCOPY;  Service: Endoscopy;  Laterality: N/A;  . ESOPHAGOGASTRODUODENOSCOPY (EGD) WITH PROPOFOL N/A 11/10/2015   Procedure: ESOPHAGOGASTRODUODENOSCOPY (EGD) WITH PROPOFOL;  Surgeon: Lucilla Lame, MD;  Location: [facility] ENDOSCOPY;  Service: Endoscopy;  Laterality: N/A;  . LAPAROSCOPIC GASTRIC SLEEVE RESECTION  02/17/2016   placement  . LOOP RECORDER IMPLANT N/A 04/25/2014   Procedure: LOOP RECORDER IMPLANT;  Surgeon: Coralyn Mark, MD;  Location: [facility] CATH LAB;  Service: Cardiovascular;  Laterality: N/A;  . LOOP RECORDER REMOVAL N/A 02/01/2018   Procedure: LOOP RECORDER REMOVAL;  Surgeon: Deboraha Sprang, MD;  Location: [facility];  Service: Cardiovascular;  Laterality: N/A;  . OVARIAN CYST REMOVAL    . TEE WITHOUT CARDIOVERSION N/A 01/25/2013   Procedure: TRANSESOPHAGEAL ECHOCARDIOGRAM (TEE);  Surgeon: Birdie Riddle, MD;  Location: [facility] ENDOSCOPY;  Service: Cardiovascular;  Laterality: N/A;    Family History  Problem Relation Age of Onset  . Diabetes Mellitus II Mother   . Diabetes Mother   . Stroke Mother   . Transient ischemic attack Father   . Stroke Father   . Stroke Other   . Diabetes Other   . Diabetes Sister     Social History   Socioeconomic History  . Marital status: Single    Spouse name: Not on file  . Number of children: 2  . Years of education: 16  . Highest education level: Not on file  Occupational History  . Not on file  Tobacco Use  .  Smoking status: Former Smoker    Packs/day: 1.00    Years: 10.00    Pack years: 10.00    Types: Cigarettes    Quit date: 07/14/2006    Years since quitting: 14.7  . Smokeless tobacco: Never Used  . Tobacco comment: quit smoking around 2007  Vaping Use  . Vaping Use: Never used  Substance and Sexual Activity  . Alcohol use: No    Alcohol/week: 0.0 standard drinks  . Drug use: No  . Sexual activity: Yes  Other Topics Concern  . Not on file  Social History Narrative   Patient is single with two  children.   Patient is right handed.   Patient has 13 yrs of education.   Patient drinks 5 sodas daily.   Social Determinants of Health   Financial Resource Strain: Not on file  Food Insecurity: Not on file  Transportation Needs: Not on file  Physical Activity: Not on file  Stress: Not on file  Social Connections: Not on file  Intimate Partner Violence: Not on file    Outpatient Medications Prior to Visit  Medication Sig Dispense Refill  . amLODipine (NORVASC) 10 MG tablet Take 1 tablet (10 mg total) by mouth daily. 60 tablet 0  . atorvastatin (LIPITOR) 40 MG tablet Take 1 tablet (40 mg total) by mouth daily. 60 tablet 0  . fluticasone (FLONASE) 50 MCG/ACT nasal spray Place 2 sprays into both nostrils daily. 16 g 6  . hydrochlorothiazide (HYDRODIURIL) 12.5 MG tablet Take 1 tablet (12.5 mg total) by mouth daily. 60 tablet 0  . metoprolol tartrate (LOPRESSOR) 25 MG tablet Take 1 tablet by mouth twice daily. 60 tablet 0  . warfarin (COUMADIN) 5 MG tablet TAKE 1/2 TO 1 (ONE-HALF TO ONE) TABLET BY MOUTH ONCE DAILY AS DIRECTED BY  COUMADIN  CLINIC 90 tablet 1  . Calcipotriene-Betameth Diprop (WYNZORA) 0.005-0.064 % CREA Apply 1 application topically as directed. Apply twice daily for two weeks then decrease once daily for 5 days. Avoid Face, groin and underarms. (Patient not taking: Reported on 04/12/2021) 60 g 2  . potassium chloride (KLOR-CON) 10 MEQ tablet Take 1 tablet (10 mEq total) by mouth 2 (two) times daily for 3 days. 6 tablet 0   No facility-administered medications prior to visit.    No Known Allergies  ROS Review of Systems Review of Systems  Constitutional: Negative for activity change, appetite change.   HENT: Negative for congestion, sinus pressure and sore throat.   Eyes: Negative for visual disturbance.  Respiratory: Negative for cough, chest tightness, shortness of breath and wheezing.   Cardiovascular: Negative for chest pain and palpitations.  Gastrointestinal:  Negative for abdominal distention, abdominal pain and constipation.  Endocrine: Negative for polydipsia.  Genitourinary: Negative for dysuria and frequency.  Musculoskeletal: Negative for arthralgias and back pain.  Skin: Negative for rash.  Neurological: Negative for tremors, light-headedness and numbness.  Hematological: Does not bruise/bleed easily.  Psychiatric/Behavioral: Negative for agitation and behavioral problems.   Objective:    Physical Exam  Constitutional:      Appearance: She is well-developed.  Neck:     Vascular: No JVD.  Cardiovascular:     Rate and Rhythm: Normal rate.     Heart sounds: Normal heart sounds. No murmur.  Hematologic:      Positive for conjunctival pallor Pulmonary:     Effort: Pulmonary effort is normal.     Breath sounds: Normal breath sounds. No wheezing or rales.  Chest:  Chest wall: No tenderness.  Abdominal:     General: Bowel sounds are normal. There is no distension.     Palpations: Abdomen is soft. There is no mass.     Tenderness: There is no abdominal tenderness.  Musculoskeletal:        General: Normal range of motion.     Right lower leg: No edema.     Left lower leg: No edema.  Neurological:     Mental Status: She is alert and oriented to person, place, and time.  Psychiatric:        Mood and Affect: Mood normal.   BP 119/72   Pulse 71   Resp 18   Ht $R'5\' 5"'DK$  (1.651 m)   Wt 231 lb (104.8 kg)   SpO2 98%   BMI 38.44 kg/m  Wt Readings from Last 3 Encounters:  04/12/21 231 lb (104.8 kg)  04/01/21 222 lb (100.7 kg)  03/30/21 220 lb (99.8 kg)     Health Maintenance Due  Topic Date Due  . PNEUMOCOCCAL POLYSACCHARIDE VACCINE AGE 45-64 HIGH RISK  Never done  . OPHTHALMOLOGY EXAM  Never done  . FOOT EXAM  06/09/2020  . COLONOSCOPY (Pts 45-35yrs Insurance coverage will need to be confirmed)  11/09/2020  . MAMMOGRAM  11/26/2020  . URINE MICROALBUMIN  03/25/2021    There are no preventive care reminders to display for  this patient.  Lab Results  Component Value Date   TSH 1.16 05/09/2016   Lab Results  Component Value Date   WBC 4.2 04/07/2021   HGB 10.7 (L) 04/07/2021   HCT 33.2 (L) 04/07/2021   MCV 70 (L) 04/07/2021   PLT 310 04/07/2021   Lab Results  Component Value Date   NA 142 04/07/2021   K 3.6 04/07/2021   CO2 21 04/07/2021   GLUCOSE 113 (H) 04/07/2021   BUN 18 04/07/2021   CREATININE 1.18 (H) 04/07/2021   BILITOT <0.2 04/01/2021   ALKPHOS 90 04/01/2021   AST 32 04/01/2021   ALT 17 03/30/2021   PROT 8.5 04/01/2021   ALBUMIN 4.1 04/01/2021   CALCIUM 8.4 (L) 04/07/2021   ANIONGAP 8 03/30/2021   EGFR 55 (L) 04/07/2021   Lab Results  Component Value Date   CHOL 129 04/01/2021   Lab Results  Component Value Date   HDL 37 (L) 04/01/2021   Lab Results  Component Value Date   LDLCALC 70 04/01/2021   Lab Results  Component Value Date   TRIG 121 04/01/2021   Lab Results  Component Value Date   CHOLHDL 3.5 04/01/2021   Lab Results  Component Value Date   HGBA1C 5.9 (A) 03/02/2021      Assessment & Plan:   Problem List Items Addressed This Visit      Endocrine   Diabetes mellitus ([place]) Diet controlled with A1c of 5.9; goal is <7.0 Will obtain a yearly microalbumin.creatinine urine ratio today Counseled on Diabetic diet, my plate method, 201 minutes of moderate intensity exercise/week Blood sugar logs with fasting goals of 80-120 mg/dl, random of less than 180 and in the event of sugars less than 60 mg/dl or greater than 400 mg/dl encouraged to notify the clinic. Advised on the need for annual eye exams, annual foot exams, Pneumonia vaccine.   Relevant Orders   Microalbumin / creatinine urine ratio     Hematopoietic and Hemostatic   Antiphospholipid syndrome (HCC) Currently taking Coumadin and managed by the Coumadin clinic. Continue to follow up with the clinic to  ensure therapeutic range of PT/INR. Due to recent confusion + weakness, will refer to Neurology  for follow up.    Relevant Orders   Ambulatory referral to Neurology     Other   History of stroke Due to recent confusion + weakness, will refer to Neurology for follow up.   Relevant Orders   Ambulatory referral to Neurology   Anemia, iron deficiency - Primary Uncontrolled with last hemoglobin being 10.7. Will refer to gastroenterology as patient was due for colonoscopy in 2021 + reports of blood in stool occasionally. Will also start iron supplements again due to patient having a low iron saturation.    Relevant Medications   Iron, Ferrous Sulfate, 325 (65 Fe) MG TABS   Other Relevant Orders   Ambulatory referral to Gastroenterology    Other Visit Diagnoses    Screening for colon cancer    Due for screening + iron deficiency anemia on last two visits   Relevant Orders   Ambulatory referral to Gastroenterology   Hypertension complicating diabetes ([place])     Controlled with a BP today of 119/72 Continue medications as prescribed, no changes today Counseled on blood pressure goal of less than 130/80, low-sodium, DASH diet, medication compliance, 150 minutes of moderate intensity exercise per week. Discussed medication compliance, adverse effects.      Meds ordered this encounter  Medications  . Iron, Ferrous Sulfate, 325 (65 Fe) MG TABS    Sig: Take 325 mg by mouth daily.    Dispense:  60 tablet    Refill:  3    Follow-up: Return in about 6 months (around 10/12/2021) for Chronic disease management.    Vance Peper, Medical Student   Evaluation and management procedures were performed by me with Medical Student in attendance, note written by Medical Student under my supervision and collaboration. I have reviewed the note and I agree with the management and plan.  In summary Ms. Klemmer has a history of previous CVA with residual expressive aphasia, antiphospholipid syndrome and was recently evaluated at the ED for confusion.  CT head with no acute finding but she has  been referred to Neurology in the light of her comorbidities for further evaluation.  She remains on chronic anticoagulation with Coumadin.  Her anemia will be worked up by means of a colonoscopy.  Diabetes and hypertension controlled.  [name], MD, FAAFP. [facility] and [facility], [place]   04/12/2021, 5:21 PM

## 2021-04-13 LAB — MICROALBUMIN / CREATININE URINE RATIO
Creatinine, Urine: 164.8 mg/dL
Microalb/Creat Ratio: 14 mg/g creat (ref 0–29)
Microalbumin, Urine: 22.6 ug/mL

## 2021-04-19 ENCOUNTER — Encounter: Payer: Self-pay | Admitting: Family Medicine

## 2021-04-27 ENCOUNTER — Other Ambulatory Visit: Payer: Self-pay | Admitting: Family Medicine

## 2021-04-27 DIAGNOSIS — I10 Essential (primary) hypertension: Secondary | ICD-10-CM

## 2021-04-28 ENCOUNTER — Ambulatory Visit (INDEPENDENT_AMBULATORY_CARE_PROVIDER_SITE_OTHER): Payer: Managed Care, Other (non HMO)

## 2021-04-28 ENCOUNTER — Other Ambulatory Visit: Payer: Self-pay

## 2021-04-28 DIAGNOSIS — I639 Cerebral infarction, unspecified: Secondary | ICD-10-CM

## 2021-04-28 DIAGNOSIS — Z8673 Personal history of transient ischemic attack (TIA), and cerebral infarction without residual deficits: Secondary | ICD-10-CM

## 2021-04-28 DIAGNOSIS — Z5181 Encounter for therapeutic drug level monitoring: Secondary | ICD-10-CM

## 2021-04-28 LAB — POCT INR: INR: 3.3 — AB (ref 2.0–3.0)

## 2021-04-28 NOTE — Patient Instructions (Signed)
-   continue warfarin dosage of 1 pill (5 mg) every day except 1/2 tablets (2.5 mg) on Mondays & Fridays.  - recheck in 6 weeks.

## 2021-05-04 ENCOUNTER — Other Ambulatory Visit: Payer: Self-pay | Admitting: Internal Medicine

## 2021-05-04 ENCOUNTER — Other Ambulatory Visit: Payer: Self-pay | Admitting: Cardiovascular Disease

## 2021-05-04 ENCOUNTER — Other Ambulatory Visit: Payer: Self-pay | Admitting: Physician Assistant

## 2021-05-04 DIAGNOSIS — I10 Essential (primary) hypertension: Secondary | ICD-10-CM

## 2021-05-06 ENCOUNTER — Other Ambulatory Visit: Payer: Self-pay

## 2021-05-06 ENCOUNTER — Ambulatory Visit (INDEPENDENT_AMBULATORY_CARE_PROVIDER_SITE_OTHER): Payer: Medicare Other | Admitting: Dermatology

## 2021-05-06 DIAGNOSIS — L409 Psoriasis, unspecified: Secondary | ICD-10-CM

## 2021-05-06 NOTE — Progress Notes (Signed)
   Follow-Up Visit   Subjective  Mary [name] is a 55 y.o. female who presents for the following: Psoriasis (Follow up of abdomen and buttocks - Wynzora cream qd - has worked Statistician". Area is still discolored but pinkness is gone.).  The following portions of the chart were reviewed this encounter and updated as appropriate:   Tobacco  Allergies  Meds  Problems  Med Hx  Surg Hx  Fam Hx     Review of Systems:  No other skin or systemic complaints except as noted in HPI or Assessment and Plan.  Objective  Well appearing patient in no apparent distress; mood and affect are within normal limits.  A focused examination was performed including abdomen, buttocks. Relevant physical exam findings are noted in the Assessment and Plan.  Objective  Left Abdomen (side) - Upper: Macular dyschromia of inframammary area and buttocks.   Assessment & Plan  Psoriasis Left Abdomen (side) - Upper  Psoriasis is a chronic non-curable, but treatable genetic/hereditary disease that may have other systemic features affecting other organ systems such as joints (Psoriatic Arthritis). It is associated with an increased risk of inflammatory bowel disease, heart disease, non-alcoholic fatty liver disease, and depression. Well controlled today.  Continue Wynzora cream qd prn flares.  Return in about 1 year (around 05/06/2022).  I, Ashok Cordia, CMA, am acting as scribe for Sarina Ser, MD .  Documentation: I have reviewed the above documentation for accuracy and completeness, and I agree with the above.  Sarina Ser, MD

## 2021-05-14 ENCOUNTER — Encounter: Payer: Self-pay | Admitting: Dermatology

## 2021-06-09 ENCOUNTER — Ambulatory Visit (INDEPENDENT_AMBULATORY_CARE_PROVIDER_SITE_OTHER): Payer: Managed Care, Other (non HMO)

## 2021-06-09 ENCOUNTER — Other Ambulatory Visit: Payer: Self-pay

## 2021-06-09 DIAGNOSIS — Z8673 Personal history of transient ischemic attack (TIA), and cerebral infarction without residual deficits: Secondary | ICD-10-CM

## 2021-06-09 DIAGNOSIS — Z5181 Encounter for therapeutic drug level monitoring: Secondary | ICD-10-CM | POA: Diagnosis not present

## 2021-06-09 DIAGNOSIS — I639 Cerebral infarction, unspecified: Secondary | ICD-10-CM | POA: Diagnosis not present

## 2021-06-09 LAB — POCT INR: INR: 1.8 — AB (ref 2.0–3.0)

## 2021-06-09 NOTE — Patient Instructions (Signed)
-   take extra 1/2 tablet today and tomorrow, then - continue warfarin dosage of 1 pill (5 mg) every day except 1/2 tablets (2.5 mg) on Mondays & Fridays.  - recheck in 6 weeks.

## 2021-07-21 ENCOUNTER — Other Ambulatory Visit: Payer: Self-pay

## 2021-07-21 ENCOUNTER — Ambulatory Visit (INDEPENDENT_AMBULATORY_CARE_PROVIDER_SITE_OTHER): Payer: Managed Care, Other (non HMO)

## 2021-07-21 DIAGNOSIS — I639 Cerebral infarction, unspecified: Secondary | ICD-10-CM

## 2021-07-21 DIAGNOSIS — Z5181 Encounter for therapeutic drug level monitoring: Secondary | ICD-10-CM

## 2021-07-21 LAB — POCT INR: INR: 3 (ref 2.0–3.0)

## 2021-07-21 NOTE — Patient Instructions (Signed)
-   continue warfarin dosage of 1 pill (5 mg) every day except 1/2 tablets (2.5 mg) on Mondays & Fridays.  - recheck in 6 weeks.

## 2021-07-26 ENCOUNTER — Ambulatory Visit: Payer: Managed Care, Other (non HMO) | Admitting: Physician Assistant

## 2021-07-26 ENCOUNTER — Other Ambulatory Visit: Payer: Self-pay

## 2021-07-26 VITALS — BP 107/67 | HR 67 | Resp 16 | Ht 65.0 in | Wt 233.0 lb

## 2021-07-26 DIAGNOSIS — Z6838 Body mass index (BMI) 38.0-38.9, adult: Secondary | ICD-10-CM

## 2021-07-26 DIAGNOSIS — G44209 Tension-type headache, unspecified, not intractable: Secondary | ICD-10-CM

## 2021-07-26 DIAGNOSIS — I10 Essential (primary) hypertension: Secondary | ICD-10-CM | POA: Diagnosis not present

## 2021-07-26 DIAGNOSIS — D509 Iron deficiency anemia, unspecified: Secondary | ICD-10-CM

## 2021-07-26 DIAGNOSIS — D649 Anemia, unspecified: Secondary | ICD-10-CM

## 2021-07-26 MED ORDER — HYDROCHLOROTHIAZIDE 12.5 MG PO TABS: 12.5000 mg | ORAL_TABLET | Freq: Every day | ORAL | 0 refills | Status: DC

## 2021-07-26 MED ORDER — METOPROLOL TARTRATE 25 MG PO TABS: 25.0000 mg | ORAL_TABLET | Freq: Two times a day (BID) | ORAL | 0 refills | Status: DC

## 2021-07-26 MED ORDER — AMLODIPINE BESYLATE 10 MG PO TABS: 10.0000 mg | ORAL_TABLET | Freq: Every day | ORAL | 0 refills | Status: DC

## 2021-07-26 NOTE — Progress Notes (Signed)
Blood pressure elevated  Headaches - Friday and Saturday Taking Tylenol  Unsure which medication she needs a refill on

## 2021-07-26 NOTE — Progress Notes (Signed)
Established Patient Office Visit  Subjective:  Patient ID: Mary [name], female    DOB: 1966/01/25  Age: 55 y.o. MRN: [id number]  CC:  Chief Complaint  Patient presents with   Hypertension    HPI [name] reports that she has been having elevated blood pressure readings, and frontal headaches for the last 2 to 3 days.  Reports her blood pressure readings have been approximately 160/80.  Reports that she has been compliant to her blood pressure medications.  Reports that she has been having frontal headaches consistently for the last 2 to 3 days, states that she has been using Tylenol with some relief.  Reports that she does not drink much water, states that she does have elevated stressors at home.  States that sleep is good, recent eye exam with updated glasses.  States that she does not take iron on a daily basis.   Past Medical History:  Diagnosis Date   Blood transfusion without reported diagnosis    transfusion December 2016   Clotting disorder [facility])    left leg   Diabetes mellitus without complication ([place])    Hypertension    Other and unspecified ovarian cysts    Stroke [facility])    Vaginal Pap smear, abnormal     Past Surgical History:  Procedure Laterality Date   COLONOSCOPY WITH PROPOFOL N/A 11/10/2015   Procedure: COLONOSCOPY WITH PROPOFOL;  Surgeon: Lucilla Lame, MD;  Location: [facility] ENDOSCOPY;  Service: Endoscopy;  Laterality: N/A;   ESOPHAGOGASTRODUODENOSCOPY (EGD) WITH PROPOFOL N/A 11/10/2015   Procedure: ESOPHAGOGASTRODUODENOSCOPY (EGD) WITH PROPOFOL;  Surgeon: Lucilla Lame, MD;  Location: [facility] ENDOSCOPY;  Service: Endoscopy;  Laterality: N/A;   LAPAROSCOPIC GASTRIC SLEEVE RESECTION  02/17/2016   placement   LOOP RECORDER IMPLANT N/A 04/25/2014   Procedure: LOOP RECORDER IMPLANT;  Surgeon: Coralyn Mark, MD;  Location: [place] CATH LAB;  Service: Cardiovascular;  Laterality: N/A;   LOOP RECORDER REMOVAL N/A 02/01/2018   Procedure: LOOP RECORDER REMOVAL;   Surgeon: Deboraha Sprang, MD;  Location: [place] CV LAB;  Service: Cardiovascular;  Laterality: N/A;   OVARIAN CYST REMOVAL     TEE WITHOUT CARDIOVERSION N/A 01/25/2013   Procedure: TRANSESOPHAGEAL ECHOCARDIOGRAM (TEE);  Surgeon: Birdie Riddle, MD;  Location: [facility] ENDOSCOPY;  Service: Cardiovascular;  Laterality: N/A;    Family History  Problem Relation Age of Onset   Diabetes Mellitus II Mother    Diabetes Mother    Stroke Mother    Transient ischemic attack Father    Stroke Father    Stroke Other    Diabetes Other    Diabetes Sister     Social History   Socioeconomic History   Marital status: Single    Spouse name: Not on file   Number of children: 2   Years of education: 13   Highest education level: Not on file  Occupational History   Not on file  Tobacco Use   Smoking status: Former    Packs/day: 1.00    Years: 10.00    Pack years: 10.00    Types: Cigarettes    Quit date: 07/14/2006    Years since quitting: 15.0   Smokeless tobacco: Never   Tobacco comments:    quit smoking around 2007  Vaping Use   Vaping Use: Never used  Substance and Sexual Activity   Alcohol use: No    Alcohol/week: 0.0 standard drinks   Drug use: No   Sexual activity: Yes  Other Topics Concern  Not on file  Social History Narrative   Patient is single with two children.   Patient is right handed.   Patient has 13 yrs of education.   Patient drinks 5 sodas daily.   Social Determinants of Health   Financial Resource Strain: Not on file  Food Insecurity: Not on file  Transportation Needs: Not on file  Physical Activity: Not on file  Stress: Not on file  Social Connections: Not on file  Intimate Partner Violence: Not on file    Outpatient Medications Prior to Visit  Medication Sig Dispense Refill   Calcipotriene-Betameth Diprop (WYNZORA) 0.005-0.064 % CREA Apply 1 application topically as directed. Apply twice daily for two weeks then decrease once daily for 5 days. Avoid  Face, groin and underarms. 60 g 2   fluticasone (FLONASE) 50 MCG/ACT nasal spray Place 2 sprays into both nostrils daily. 16 g 6   Iron, Ferrous Sulfate, 325 (65 Fe) MG TABS Take 325 mg by mouth daily. 60 tablet 3   warfarin (COUMADIN) 5 MG tablet TAKE 1/2 TO 1 (ONE-HALF TO ONE) TABLET BY MOUTH ONCE DAILY AS DIRECTED BY  COUMADIN  CLINIC 90 tablet 0   amLODipine (NORVASC) 10 MG tablet Take 1 tablet by mouth once daily 60 tablet 0   hydrochlorothiazide (HYDRODIURIL) 12.5 MG tablet Take 1 tablet by mouth once daily 60 tablet 0   metoprolol tartrate (LOPRESSOR) 25 MG tablet Take 1 tablet by mouth twice daily 60 tablet 0   atorvastatin (LIPITOR) 40 MG tablet Take 1 tablet (40 mg total) by mouth daily. (Patient not taking: Reported on 07/26/2021) 60 tablet 0   potassium chloride (KLOR-CON) 10 MEQ tablet Take 1 tablet (10 mEq total) by mouth 2 (two) times daily for 3 days. 6 tablet 0   No facility-administered medications prior to visit.    No Known Allergies  ROS Review of Systems  Constitutional:  Negative for chills and fever.  HENT: Negative.    Eyes:  Negative for photophobia.  Respiratory:  Negative for shortness of breath.   Cardiovascular:  Negative for chest pain.  Gastrointestinal:  Negative for nausea and vomiting.  Endocrine: Negative.   Genitourinary: Negative.   Musculoskeletal: Negative.   Skin: Negative.   Allergic/Immunologic: Negative.   Neurological:  Positive for headaches. Negative for dizziness, speech difficulty and weakness.  Psychiatric/Behavioral:  Negative for self-injury, sleep disturbance and suicidal ideas.      Objective:    Physical Exam Vitals and nursing note reviewed.  Constitutional:      Appearance: Normal appearance. She is obese.  HENT:     Head: Normocephalic and atraumatic.     Right Ear: External ear normal.     Left Ear: External ear normal.     Nose: Nose normal.     Mouth/Throat:     Mouth: Mucous membranes are moist.     Pharynx:  Oropharynx is clear.  Eyes:     Extraocular Movements: Extraocular movements intact.     Conjunctiva/sclera: Conjunctivae normal.     Pupils: Pupils are equal, round, and reactive to light.  Cardiovascular:     Rate and Rhythm: Normal rate and regular rhythm.     Pulses: Normal pulses.     Heart sounds: Normal heart sounds.  Pulmonary:     Effort: Pulmonary effort is normal.     Breath sounds: Normal breath sounds.  Musculoskeletal:        General: Normal range of motion.     Cervical back: Normal range  of motion and neck supple.  Skin:    General: Skin is warm and dry.  Neurological:     General: No focal deficit present.     Mental Status: She is alert and oriented to person, place, and time.  Psychiatric:        Mood and Affect: Mood normal.        Behavior: Behavior normal.        Thought Content: Thought content normal.        Judgment: Judgment normal.    BP 107/67 (BP Location: Left Arm, Patient Position: Sitting, Cuff Size: Large)   Pulse 67   Resp 16   Ht 5' 5" (1.651 m)   Wt 233 lb (105.7 kg)   SpO2 98%   BMI 38.77 kg/m  Wt Readings from Last 3 Encounters:  07/26/21 233 lb (105.7 kg)  04/12/21 231 lb (104.8 kg)  04/01/21 222 lb (100.7 kg)     Health Maintenance Due  Topic Date Due   PNEUMOCOCCAL POLYSACCHARIDE VACCINE AGE 38-64 HIGH RISK  Never done   COVID-19 Vaccine (1) Never done   Pneumococcal Vaccine 44-49 Years old (1 - PCV) Never done   OPHTHALMOLOGY EXAM  Never done   Zoster Vaccines- Shingrix (1 of 2) Never done   FOOT EXAM  06/09/2020   COLONOSCOPY (Pts 45-44yr Insurance coverage will need to be confirmed)  11/09/2020   MAMMOGRAM  11/26/2020   INFLUENZA VACCINE  07/19/2021    There are no preventive care reminders to display for this patient.  Lab Results  Component Value Date   TSH 1.16 05/09/2016   Lab Results  Component Value Date   WBC 4.2 04/07/2021   HGB 10.7 (L) 04/07/2021   HCT 33.2 (L) 04/07/2021   MCV 70 (L) 04/07/2021    PLT 310 04/07/2021   Lab Results  Component Value Date   NA 142 04/07/2021   K 3.6 04/07/2021   CO2 21 04/07/2021   GLUCOSE 113 (H) 04/07/2021   BUN 18 04/07/2021   CREATININE 1.18 (H) 04/07/2021   BILITOT <0.2 04/01/2021   ALKPHOS 90 04/01/2021   AST 32 04/01/2021   ALT 17 03/30/2021   PROT 8.5 04/01/2021   ALBUMIN 4.1 04/01/2021   CALCIUM 8.4 (L) 04/07/2021   ANIONGAP 8 03/30/2021   EGFR 55 (L) 04/07/2021   Lab Results  Component Value Date   CHOL 129 04/01/2021   Lab Results  Component Value Date   HDL 37 (L) 04/01/2021   Lab Results  Component Value Date   LDLCALC 70 04/01/2021   Lab Results  Component Value Date   TRIG 121 04/01/2021   Lab Results  Component Value Date   CHOLHDL 3.5 04/01/2021   Lab Results  Component Value Date   HGBA1C 5.9 (A) 03/02/2021      Assessment & Plan:   Problem List Items Addressed This Visit       Cardiovascular and Mediastinum   Essential hypertension - Primary   Relevant Medications   amLODipine (NORVASC) 10 MG tablet   hydrochlorothiazide (HYDRODIURIL) 12.5 MG tablet   metoprolol tartrate (LOPRESSOR) 25 MG tablet     Other   Headache   Relevant Medications   amLODipine (NORVASC) 10 MG tablet   metoprolol tartrate (LOPRESSOR) 25 MG tablet   Anemia, iron deficiency    Meds ordered this encounter  Medications   amLODipine (NORVASC) 10 MG tablet    Sig: Take 1 tablet (10 mg total) by mouth daily.  Dispense:  90 tablet    Refill:  0    Order Specific Question:   Supervising Provider    Answer:   Elsie Stain [[id number]]   hydrochlorothiazide (HYDRODIURIL) 12.5 MG tablet    Sig: Take 1 tablet (12.5 mg total) by mouth daily.    Dispense:  90 tablet    Refill:  0    Order Specific Question:   Supervising Provider    Answer:   Elsie Stain [[id number]]   metoprolol tartrate (LOPRESSOR) 25 MG tablet    Sig: Take 1 tablet (25 mg total) by mouth 2 (two) times daily.    Dispense:  90 tablet    Refill:  0     Order Specific Question:   Supervising Provider    Answer:   WRIGHT, PATRICK E [[id number]]  1. Essential hypertension Continue current regimen, blood pressure within normal limits at today's office visit.  Patient encouraged to increase hydration, work on stress management, continue checking blood pressure at home, keep a written log and have available for all office visits.  Red flags given for prompt reevaluation. - amLODipine (NORVASC) 10 MG tablet; Take 1 tablet (10 mg total) by mouth daily.  Dispense: 90 tablet; Refill: 0 - hydrochlorothiazide (HYDRODIURIL) 12.5 MG tablet; Take 1 tablet (12.5 mg total) by mouth daily.  Dispense: 90 tablet; Refill: 0 - metoprolol tartrate (LOPRESSOR) 25 MG tablet; Take 1 tablet (25 mg total) by mouth 2 (two) times daily.  Dispense: 90 tablet; Refill: 0  2. Acute non intractable tension-type headache Patient encouraged to increase hydration, patient education given on stress management.  Patient encouraged to keep log of headaches  3. Iron deficiency anemia, unspecified iron deficiency anemia type Patient strongly encouraged to resume iron at this time.  Patient was given an appointment to follow-up with primary care provider, patient encouraged to return to mobile unit as needed.   I have reviewed the patient's medical history (PMH, PSH, Social History, Family History, Medications, and allergies) , and have been updated if relevant. I spent 31 minutes reviewing chart and  face to face time with patient.    Follow-up: Return if symptoms worsen or fail to improve.    Loraine Grip Mayers, PA-C

## 2021-07-26 NOTE — Patient Instructions (Signed)
I sent the refills of your blood pressure medications to your pharmacy.  I do encourage you to increase your hydration, you should drink at least 64 ounces of water a day.  I also encourage you to restart your iron on a daily basis.  Dehydration and anemia can both cause the headaches that you are describing.  I encourage you to check your blood pressure at home on a daily basis, keep a written log and have available for all office visits.  I encourage you to work on managing your stress.  I hope that you feel better soon.  Kennieth Rad, PA-C Physician Assistant [place] [email or web address]   Managing Stress, Adult Feeling a certain amount of stress is normal. Stress helps our body and mind get ready to deal with the demands of life. Stress hormones can motivate you to do well at work and meet your responsibilities. However severe or long-lasting (chronic) stress can affect your mental and physical health. Chronic stress puts you at higher risk for anxiety, depression, and other health problems like digestiveproblems, muscle aches, heart disease, high blood pressure, and stroke. What are the causes? Common causes of stress include: Demands from work, such as deadlines, feeling overworked, or having long hours. Pressures at home, such as money issues, disagreements with a spouse, or parenting issues. Pressures from major life changes, such as divorce, moving, loss of a loved one, or chronic illness. You may be at higher risk for stress-related problems if you do not get enough sleep, are in poor health, do not have emotional support, or have a mentalhealth disorder like anxiety or depression. How to recognize stress Stress can make you: Have trouble sleeping. Feel sad, anxious, irritable, or overwhelmed. Lose your appetite. Overeat or want to eat unhealthy foods. Want to use drugs or alcohol. Stress can also cause physical  symptoms, such as: Sore, tense muscles, especially in the shoulders and neck. Headaches. Trouble breathing. A faster heart rate. Stomach pain, nausea, or vomiting. Diarrhea or constipation. Trouble concentrating. Follow these instructions at home: Lifestyle Identify the source of your stress and your reaction to it. See a therapist who can help you change your reactions. When there are stressful events: Talk about it with family, friends, or co-workers. Try to think realistically about stressful events and not ignore them or overreact. Try to find the positives in a stressful situation and not focus on the negatives. Cut back on responsibilities at work and home, if possible. Ask for help from friends or family members if you need it. Find ways to cope with stress, such as: Meditation. Deep breathing. Yoga or tai chi. Progressive muscle relaxation. Doing art, playing music, or reading. Making time for fun activities. Spending time with family and friends. Get support from family, friends, or spiritual resources. Eating and drinking Eat a healthy diet. This includes: Eating foods that are high in fiber, such as beans, whole grains, and fresh fruits and vegetables. Limiting foods that are high in fat and processed sugars, such as fried and sweet foods. Do not skip meals or overeat. Drink enough fluid to keep your urine pale yellow. Alcohol use Do not drink alcohol if: Your health care provider tells you not to drink. You are pregnant, may be pregnant, or are planning to become pregnant. Drinking alcohol is a way some people try to ease their stress. This can be dangerous, so if you drink alcohol: Limit how much you use to: 0-1 drink a day for women. 0-2  drinks a day for men. Be aware of how much alcohol is in your drink. In the [place] one drink equals one 12 oz bottle of beer (355 mL), one 5 oz glass of wine (148 mL), or one 1 oz glass of hard liquor (44 mL). Activity  Include  30 minutes of exercise in your daily schedule. Exercise is a good stress reducer. Include time in your day for an activity that you find relaxing. Try taking a walk, going on a bike ride, reading a book, or listening to music. Schedule your time in a way that lowers stress, and keep a consistent schedule. Prioritize what is most important to get done.  General instructions Get enough sleep. Try to go to sleep and get up at about the same time every day. Take over-the-counter and prescription medicines only as told by your health care provider. Do not use any products that contain nicotine or tobacco, such as cigarettes, e-cigarettes, and chewing tobacco. If you need help quitting, ask your health care provider. Do not use drugs or smoke to cope with stress. Keep all follow-up visits as told by your health care provider. This is important. Where to find support Talk with your health care provider about stress management or finding a support group. Find a therapist to work with you on your stress management techniques. Contact a health care provider if: Your stress symptoms get worse. You are unable to manage your stress at home. You are struggling to stop using drugs or alcohol. Get help right away if: You may be a danger to yourself or others. You have any thoughts of death or suicide. If you ever feel like you may hurt yourself or others, or have thoughts about taking your own life, get help right away. You can go to your nearest emergency department or call: Your local emergency services (911 in the [place] A suicide crisis helpline, such as the [facility] at 8651792158. This is open 24 hours a day. Summary Feeling a certain amount of stress is normal, but severe or long-lasting (chronic) stress can affect your mental and physical health. Chronic stress can put you at higher risk for anxiety, depression, and other health problems like digestive problems, muscle  aches, heart disease, high blood pressure, and stroke. You may be at higher risk for stress-related problems if you do not get enough sleep, are in poor health, lack emotional support, or have a mental health disorder like anxiety or depression. Identify the source of your stress and your reaction to it. Try talking about stressful events with family, friends, or co-workers, finding a coping method, or getting support from spiritual resources. If you need more help, talk with your health care provider about finding a support group or a mental health therapist. This information is not intended to replace advice given to you by your health care provider. Make sure you discuss any questions you have with your healthcare provider. Document Revised: 07/03/2019 Document Reviewed: 07/03/2019 Elsevier Patient Education  [place] Headache Without Cause A headache is pain or discomfort felt around the head or neck area. The specific cause of a headache may not be found. There are many causes and types of headaches. A few common ones are: Tension headaches. Migraine headaches. Cluster headaches. Chronic daily headaches. Follow these instructions at home: Watch your condition for any changes. Let your health care provider know aboutthem. Take these steps to help with your condition: Managing pain  Take over-the-counter and prescription medicines only as told by your health care provider. Lie down in a dark, quiet room when you have a headache. If directed, put ice on your head and neck area: Put ice in a plastic bag. Place a towel between your skin and the bag. Leave the ice on for 20 minutes, 2-3 times per day. If directed, apply heat to the affected area. Use the heat source that your health care provider recommends, such as a moist heat pack or a heating pad. Place a towel between your skin and the heat source. Leave the heat on for 20-30 minutes. Remove the heat if your  skin turns bright red. This is especially important if you are unable to feel pain, heat, or cold. You may have a greater risk of getting burned. Keep lights dim if bright lights bother you or make your headaches worse. Eating and drinking Eat meals on a regular schedule. If you drink alcohol: Limit how much you use to: 0-1 drink a day for women. 0-2 drinks a day for men. Be aware of how much alcohol is in your drink. In the [place] one drink equals one 12 oz bottle of beer (355 mL), one 5 oz glass of wine (148 mL), or one 1 oz glass of hard liquor (44 mL). Stop drinking caffeine, or decrease the amount of caffeine you drink. General instructions  Keep a headache journal to help find out what may trigger your headaches. For example, write down: What you eat and drink. How much sleep you get. Any change to your diet or medicines. Try massage or other relaxation techniques. Limit stress. Sit up straight, and do not tense your muscles. Do not use any products that contain nicotine or tobacco, such as cigarettes, e-cigarettes, and chewing tobacco. If you need help quitting, ask your health care provider. Exercise regularly as told by your health care provider. Sleep on a regular schedule. Get 7-9 hours of sleep each night, or the amount recommended by your health care provider. Keep all follow-up visits as told by your health care provider. This is important.  Contact a health care provider if: Your symptoms are not helped by medicine. You have a headache that is different from the usual headache. You have nausea or you vomit. You have a fever. Get help right away if: Your headache becomes severe quickly. Your headache gets worse after moderate to intense physical activity. You have repeated vomiting. You have a stiff neck. You have a loss of vision. You have problems with speech. You have pain in the eye or ear. You have muscular weakness or loss of muscle control. You lose your  balance or have trouble walking. You feel faint or pass out. You have confusion. You have a seizure. Summary A headache is pain or discomfort felt around the head or neck area. There are many causes and types of headaches. In some cases, the cause may not be found. Keep a headache journal to help find out what may trigger your headaches. Watch your condition for any changes. Let your health care provider know about them. Contact a health care provider if you have a headache that is different from the usual headache, or if your symptoms are not helped by medicine. Get help right away if your headache becomes severe, you vomit, you have a loss of vision, you lose your balance, or you have a seizure. This information is not intended to replace advice given to you by your health care  provider. Make sure you discuss any questions you have with your healthcare provider. Document Revised: 06/25/2018 Document Reviewed: 06/25/2018 Elsevier Patient Education  [place].

## 2021-08-02 ENCOUNTER — Other Ambulatory Visit: Payer: Self-pay

## 2021-08-02 ENCOUNTER — Ambulatory Visit (INDEPENDENT_AMBULATORY_CARE_PROVIDER_SITE_OTHER): Payer: Medicare Other | Admitting: Obstetrics and Gynecology

## 2021-08-02 ENCOUNTER — Other Ambulatory Visit (HOSPITAL_COMMUNITY)
Admission: RE | Admit: 2021-08-02 | Discharge: 2021-08-02 | Disposition: A | Discharge status: Home / Self Care | Payer: Medicare Other | Source: Ambulatory Visit | Attending: Obstetrics and Gynecology | Admitting: Obstetrics and Gynecology

## 2021-08-02 ENCOUNTER — Encounter: Payer: Self-pay | Admitting: Obstetrics and Gynecology

## 2021-08-02 VITALS — BP 120/74 | Ht 65.0 in | Wt 230.0 lb

## 2021-08-02 DIAGNOSIS — Z1151 Encounter for screening for human papillomavirus (HPV): Secondary | ICD-10-CM

## 2021-08-02 DIAGNOSIS — Z1231 Encounter for screening mammogram for malignant neoplasm of breast: Secondary | ICD-10-CM

## 2021-08-02 DIAGNOSIS — N764 Abscess of vulva: Secondary | ICD-10-CM | POA: Diagnosis not present

## 2021-08-02 DIAGNOSIS — Z124 Encounter for screening for malignant neoplasm of cervix: Secondary | ICD-10-CM | POA: Diagnosis present

## 2021-08-02 DIAGNOSIS — L732 Hidradenitis suppurativa: Secondary | ICD-10-CM | POA: Diagnosis not present

## 2021-08-02 MED ORDER — CEPHALEXIN 500 MG PO CAPS: 500.0000 mg | ORAL_CAPSULE | Freq: Two times a day (BID) | ORAL | 0 refills | Status: AC

## 2021-08-02 NOTE — Progress Notes (Signed)
Mary Rakes, MD   Chief Complaint  Patient presents with   Vaginal Exam    Bump sin vag area     HPI:      [name] is a 55 y.o. [phone] whose LMP was No LMP recorded. (Menstrual status: Other)., presents today for NP eval of vulvar lesion for a couple wks. Has recurrent sx, sometimes in same area, for many yrs. Has scarring in pubic area from previous lesions. Lesions are painful, sometimes drain. Hasn't tried warm compresses, no meds to treat. Has had similar one in axilla years ago. No fevers, no vag d/c, odor. No FH that she knows of. HSV mentioned to pt 20-30 yrs ago but no testing done. Pt worried about HSV so has delayed eval.   Also needs pap smear. [facility] and was sent MD in [place], but "nothing came of it". No labs visible in chart. No other hx of abn paps.  Mammo due; last one 2018 at [facility] No FH breast/ovar cancer.   She is postmenopausal, no PMB, occas vasomotor sx.  On coumadin currently.  Past Medical History:  Diagnosis Date   Blood transfusion without reported diagnosis    transfusion December 2016   Clotting disorder [facility])    left leg   Diabetes mellitus without complication ([place])    Hypertension    Other and unspecified ovarian cysts    Stroke [facility])    Vaginal Pap smear, abnormal     Past Surgical History:  Procedure Laterality Date   COLONOSCOPY WITH PROPOFOL N/A 11/10/2015   Procedure: COLONOSCOPY WITH PROPOFOL;  Surgeon: Lucilla Lame, MD;  Location: [facility] ENDOSCOPY;  Service: Endoscopy;  Laterality: N/A;   ESOPHAGOGASTRODUODENOSCOPY (EGD) WITH PROPOFOL N/A 11/10/2015   Procedure: ESOPHAGOGASTRODUODENOSCOPY (EGD) WITH PROPOFOL;  Surgeon: Lucilla Lame, MD;  Location: [facility] ENDOSCOPY;  Service: Endoscopy;  Laterality: N/A;   LAPAROSCOPIC GASTRIC SLEEVE RESECTION  02/17/2016   placement   LOOP RECORDER IMPLANT N/A 04/25/2014   Procedure: LOOP RECORDER IMPLANT;  Surgeon: Coralyn Mark, MD;  Location: [place]  CATH LAB;  Service: Cardiovascular;  Laterality: N/A;   LOOP RECORDER REMOVAL N/A 02/01/2018   Procedure: LOOP RECORDER REMOVAL;  Surgeon: Deboraha Sprang, MD;  Location: [place] CV LAB;  Service: Cardiovascular;  Laterality: N/A;   OVARIAN CYST REMOVAL     TEE WITHOUT CARDIOVERSION N/A 01/25/2013   Procedure: TRANSESOPHAGEAL ECHOCARDIOGRAM (TEE);  Surgeon: Birdie Riddle, MD;  Location: [facility] ENDOSCOPY;  Service: Cardiovascular;  Laterality: N/A;    Family History  Problem Relation Age of Onset   Diabetes Mellitus II Mother    Diabetes Mother    Stroke Mother    Transient ischemic attack Father    Stroke Father    Stroke Other    Diabetes Other    Diabetes Sister     Social History   Socioeconomic History   Marital status: Single    Spouse name: Not on file   Number of children: 2   Years of education: 78   Highest education level: Not on file  Occupational History   Not on file  Tobacco Use   Smoking status: Former    Packs/day: 1.00    Years: 10.00    Pack years: 10.00    Types: Cigarettes    Quit date: 07/14/2006    Years since quitting: 15.0   Smokeless tobacco: Never   Tobacco comments:    quit smoking around 2007  Vaping Use   Vaping Use: Never used  Substance and Sexual Activity   Alcohol use: No    Alcohol/week: 0.0 standard drinks   Drug use: No   Sexual activity: Yes  Other Topics Concern   Not on file  Social History Narrative   Patient is single with two children.   Patient is right handed.   Patient has 13 yrs of education.   Patient drinks 5 sodas daily.   Social Determinants of Health   Financial Resource Strain: Not on file  Food Insecurity: Not on file  Transportation Needs: Not on file  Physical Activity: Not on file  Stress: Not on file  Social Connections: Not on file  Intimate Partner Violence: Not on file    Outpatient Medications Prior to Visit  Medication Sig Dispense Refill   amLODipine (NORVASC) 10 MG tablet Take 1 tablet  (10 mg total) by mouth daily. 90 tablet 0   Calcipotriene-Betameth Diprop (WYNZORA) 0.005-0.064 % CREA Apply 1 application topically as directed. Apply twice daily for two weeks then decrease once daily for 5 days. Avoid Face, groin and underarms. 60 g 2   fluticasone (FLONASE) 50 MCG/ACT nasal spray Place 2 sprays into both nostrils daily. 16 g 6   hydrochlorothiazide (HYDRODIURIL) 12.5 MG tablet Take 1 tablet (12.5 mg total) by mouth daily. 90 tablet 0   Iron, Ferrous Sulfate, 325 (65 Fe) MG TABS Take 325 mg by mouth daily. 60 tablet 3   metoprolol tartrate (LOPRESSOR) 25 MG tablet Take 1 tablet (25 mg total) by mouth 2 (two) times daily. 90 tablet 0   warfarin (COUMADIN) 5 MG tablet TAKE 1/2 TO 1 (ONE-HALF TO ONE) TABLET BY MOUTH ONCE DAILY AS DIRECTED BY  COUMADIN  CLINIC 90 tablet 0   atorvastatin (LIPITOR) 40 MG tablet Take 1 tablet (40 mg total) by mouth daily. (Patient not taking: No sig reported) 60 tablet 0   potassium chloride (KLOR-CON) 10 MEQ tablet Take 1 tablet (10 mEq total) by mouth 2 (two) times daily for 3 days. 6 tablet 0   No facility-administered medications prior to visit.      ROS:  Review of Systems  Constitutional:  Negative for fever.  Gastrointestinal:  Negative for blood in stool, constipation, diarrhea, nausea and vomiting.  Genitourinary:  Positive for genital sores. Negative for dyspareunia, dysuria, flank pain, frequency, hematuria, urgency, vaginal bleeding, vaginal discharge and vaginal pain.  Musculoskeletal:  Negative for back pain.  Skin:  Negative for rash.  BREAST: No symptoms   OBJECTIVE:   Vitals:  BP 120/74   Ht '5\' 5"'$  (1.651 m)   Wt 230 lb (104.3 kg)   BMI 38.27 kg/m   Physical Exam Vitals reviewed.  Constitutional:      Appearance: She is well-developed.  Pulmonary:     Effort: Pulmonary effort is normal.  Genitourinary:    Pubic Area: Rash present.     Labia:        Right: No rash, tenderness or lesion.        Left: No rash,  tenderness or lesion.      Vagina: Normal. No vaginal discharge, erythema or tenderness.     Cervix: Normal.     Uterus: Normal. Not enlarged and not tender.      Adnexa: Right adnexa normal and left adnexa normal.       Right: No mass or tenderness.         Left: No mass or tenderness.  Musculoskeletal:        General: Normal range of motion.     Cervical back: Normal range of motion.  Skin:    General: Skin is warm and dry.  Neurological:     General: No focal deficit present.     Mental Status: She is alert and oriented to person, place, and time.  Psychiatric:        Mood and Affect: Mood normal.        Behavior: Behavior normal.        Thought Content: Thought content normal.        Judgment: Judgment normal.    Assessment/Plan: Vulvar abscess - Plan: cephALEXin (KEFLEX) 500 MG capsule; mons area due to HS. Rx keflex, warm compresses. F/u prn.  Hidradenitis suppurativa - Plan: cephALEXin (KEFLEX) 500 MG capsule; wash with Dial soap, trim not shave/wax hairs prn. Can f/u with derm prn.   Cervical cancer screening - Plan: Cytology - PAP  Screening for HPV (human papillomavirus) - Plan: Cytology - PAP; abn pap per pt but no record in chart. Will do colpo if abn.   Encounter for screening mammogram for malignant neoplasm of breast - Plan: MM 3D SCREEN BREAST BILATERAL; pt to sched mammo   Meds ordered this encounter  Medications   cephALEXin (KEFLEX) 500 MG capsule    Sig: Take 1 capsule (500 mg total) by mouth 2 (two) times daily for 14 days.    Dispense:  28 capsule    Refill:  0    Order Specific Question:   Supervising Provider    Answer:   Gae Dry [id number]       Return if symptoms worsen or fail to improve.  Tomy Khim B. Anthem Frazer, PA-C 08/02/2021 2:01 PM

## 2021-08-02 NOTE — Patient Instructions (Signed)
I value your feedback and you entrusting us with your care. If you get a [facility] patient survey, I would appreciate you taking the time to let us know about your experience today. Thank you! ? ? ?

## 2021-08-04 LAB — CYTOLOGY - PAP
Comment: NEGATIVE
Diagnosis: NEGATIVE
High risk HPV: NEGATIVE

## 2021-08-09 ENCOUNTER — Other Ambulatory Visit: Payer: Self-pay

## 2021-08-09 ENCOUNTER — Ambulatory Visit
Admission: RE | Admit: 2021-08-09 | Discharge: 2021-08-09 | Disposition: A | Discharge status: Home / Self Care | Payer: Medicare Other | Source: Ambulatory Visit | Attending: Obstetrics and Gynecology | Admitting: Obstetrics and Gynecology

## 2021-08-09 DIAGNOSIS — Z1231 Encounter for screening mammogram for malignant neoplasm of breast: Secondary | ICD-10-CM | POA: Diagnosis present

## 2021-08-09 IMAGING — MG MM DIGITAL SCREENING BILAT W/ TOMO AND CAD
6 of 10 series · 6 of 30 positions shown · non-contrast
Comparison: Previous exam(s).

CLINICAL DATA: Screening.

EXAM:
DIGITAL SCREENING BILATERAL MAMMOGRAM WITH TOMOSYNTHESIS AND CAD
TECHNIQUE: Bilateral screening digital craniocaudal and mediolateral oblique
mammograms were obtained. Bilateral screening digital breast
tomosynthesis was performed. The images were evaluated with
computer-aided detection.

[L MLO synth-2D (1 of 2)]
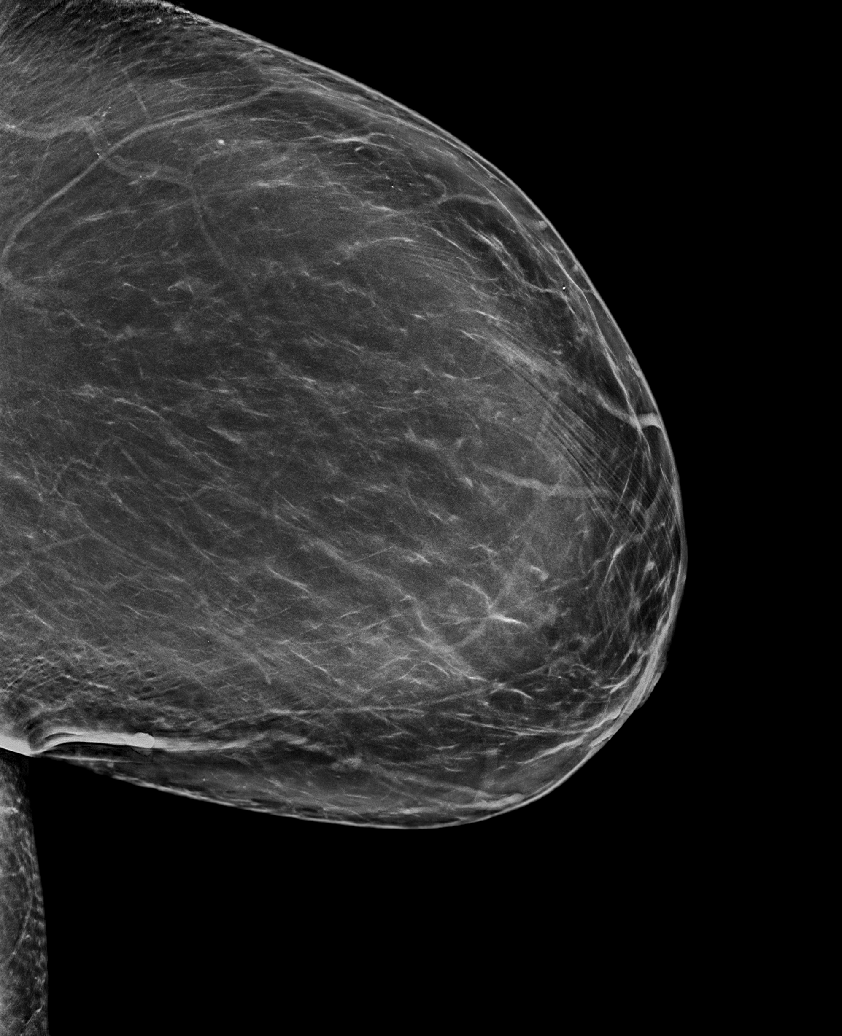

[L MLO synth-2D (2 of 2)]
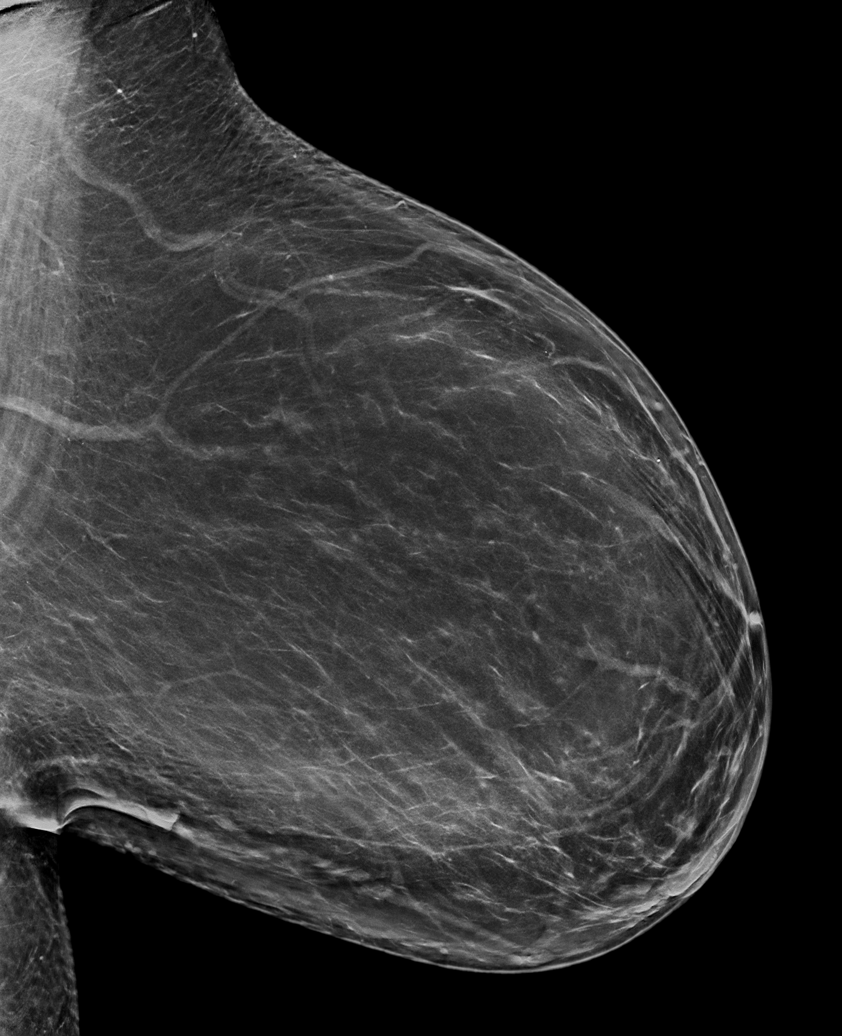

[R CC synth-2D]
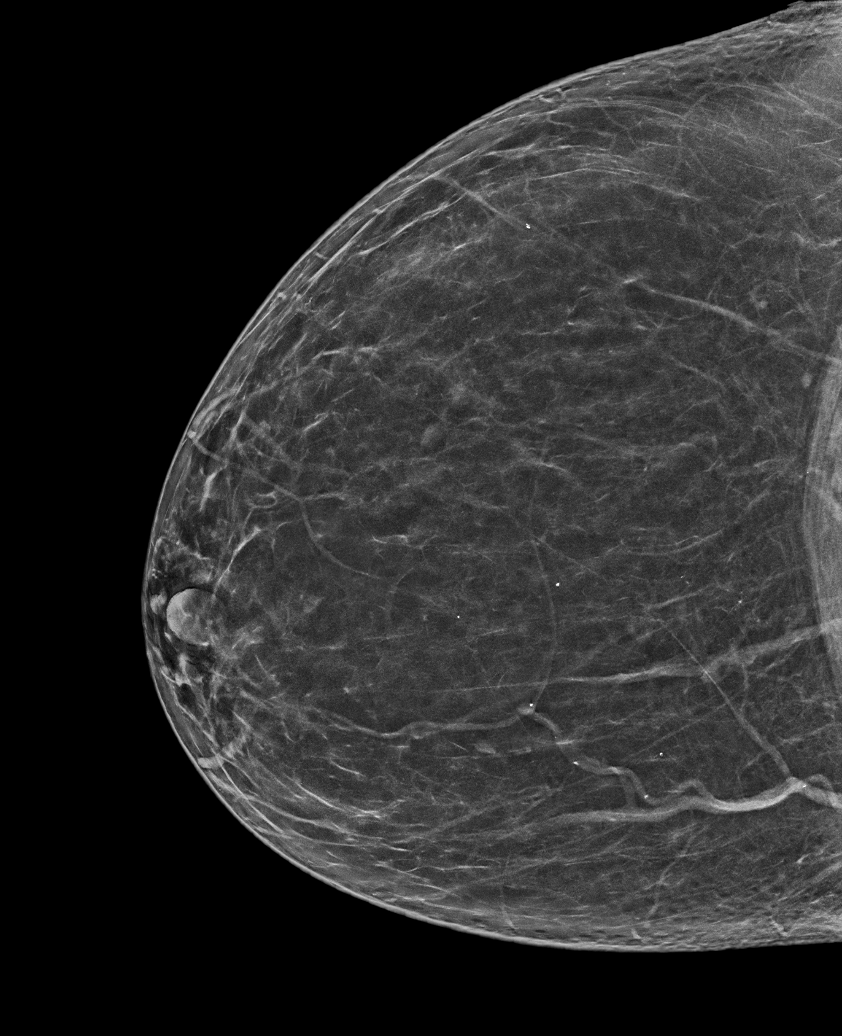

[L CC synth-2D]
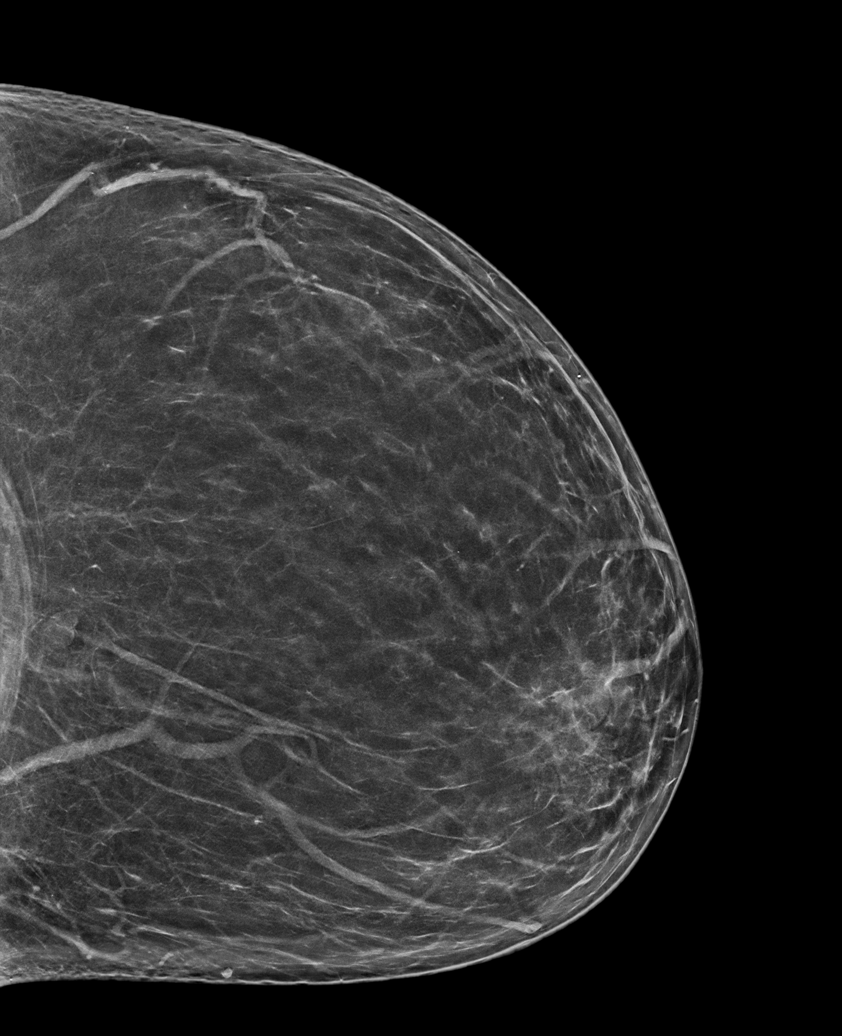

[R MLO synth-2D]
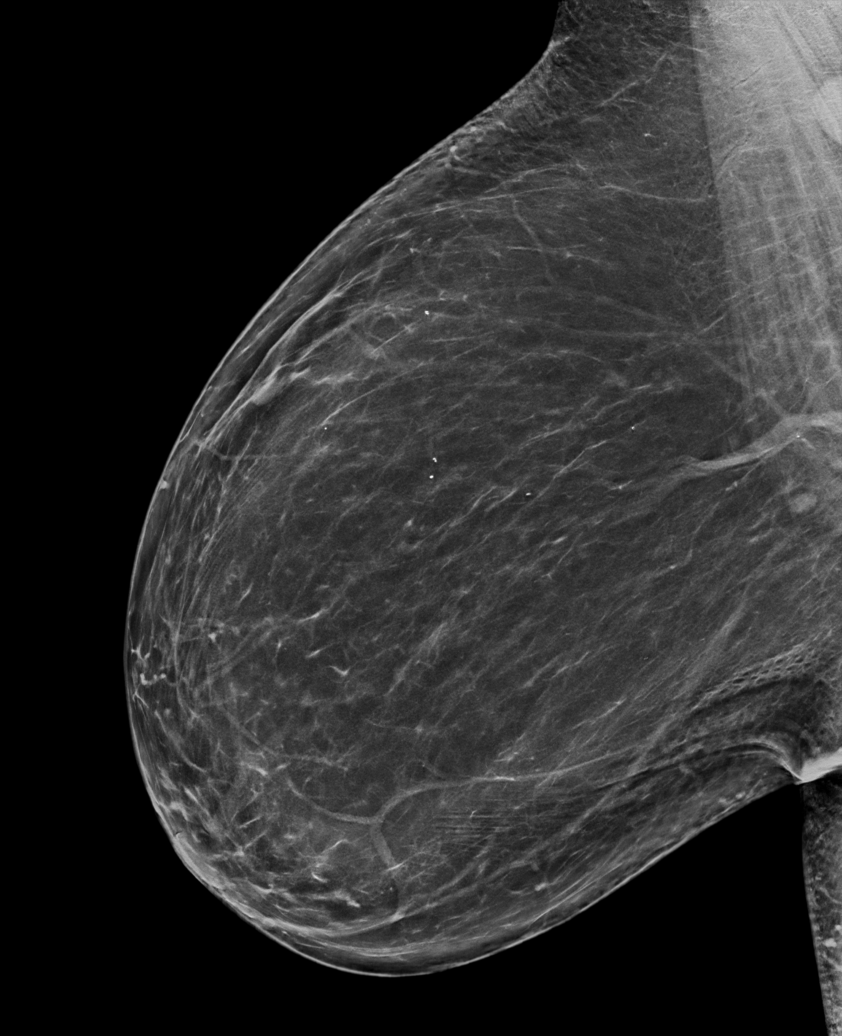

[L MLO tomo · tomo slice 45/90.0]
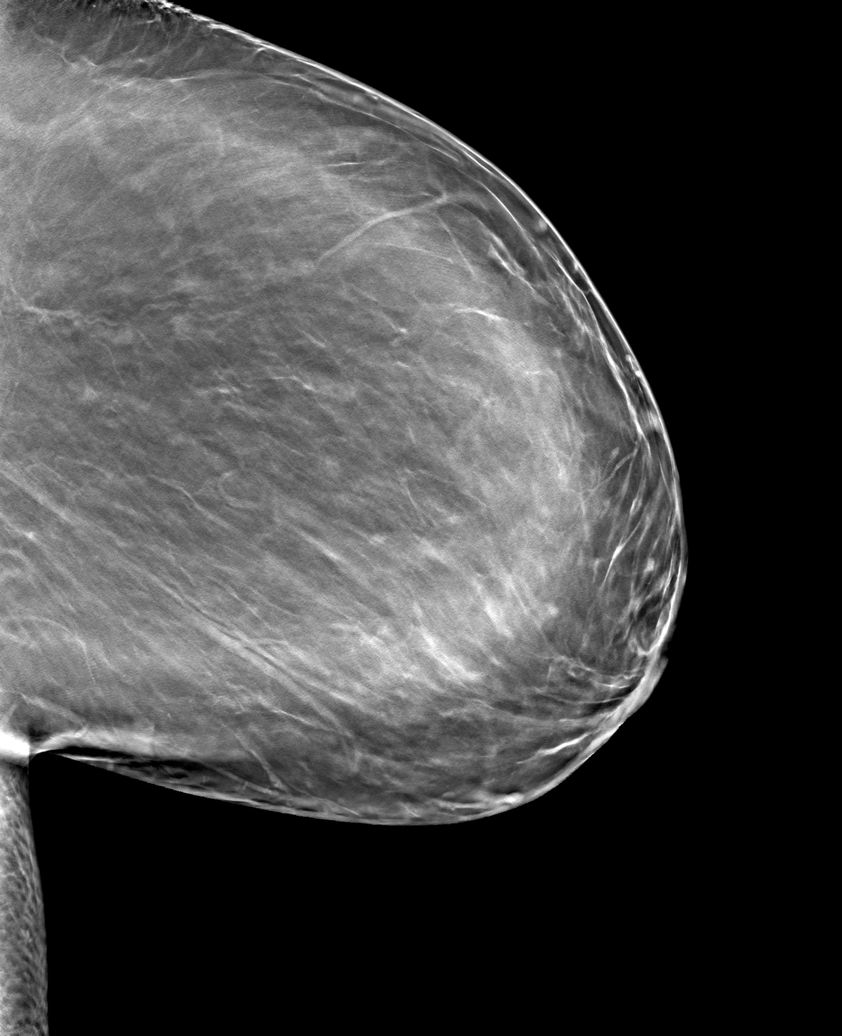

[6 of 30 positions shown; findings below may reference images not displayed]

ACR Breast Density Category b: There are scattered areas of
fibroglandular density.
FINDINGS: There are no findings suspicious for malignancy.
IMPRESSION: No mammographic evidence of malignancy. A result letter of this
screening mammogram will be mailed directly to the patient.

RECOMMENDATION:
Screening mammogram in one year. (Code:[BY])

BI-RADS CATEGORY  1: Negative.

## 2021-09-01 ENCOUNTER — Other Ambulatory Visit: Payer: Self-pay

## 2021-09-06 ENCOUNTER — Encounter (HOSPITAL_COMMUNITY): Payer: Self-pay | Admitting: Emergency Medicine

## 2021-09-06 ENCOUNTER — Emergency Department (HOSPITAL_COMMUNITY): Payer: Medicare Other

## 2021-09-06 ENCOUNTER — Emergency Department (HOSPITAL_COMMUNITY)
Admission: EM | Admit: 2021-09-06 | Discharge: 2021-09-06 | Disposition: A | Discharge status: Home / Self Care | Payer: Medicare Other | Attending: Emergency Medicine | Admitting: Emergency Medicine

## 2021-09-06 DIAGNOSIS — Z23 Encounter for immunization: Secondary | ICD-10-CM | POA: Insufficient documentation

## 2021-09-06 DIAGNOSIS — Z8673 Personal history of transient ischemic attack (TIA), and cerebral infarction without residual deficits: Secondary | ICD-10-CM | POA: Diagnosis not present

## 2021-09-06 DIAGNOSIS — Y92410 Unspecified street and highway as the place of occurrence of the external cause: Secondary | ICD-10-CM | POA: Diagnosis not present

## 2021-09-06 DIAGNOSIS — S0993XA Unspecified injury of face, initial encounter: Secondary | ICD-10-CM | POA: Diagnosis not present

## 2021-09-06 DIAGNOSIS — O0289 Other abnormal products of conception: Secondary | ICD-10-CM | POA: Insufficient documentation

## 2021-09-06 DIAGNOSIS — J9811 Atelectasis: Secondary | ICD-10-CM | POA: Diagnosis not present

## 2021-09-06 DIAGNOSIS — S43102A Unspecified dislocation of left acromioclavicular joint, initial encounter: Secondary | ICD-10-CM

## 2021-09-06 DIAGNOSIS — S00212A Abrasion of left eyelid and periocular area, initial encounter: Secondary | ICD-10-CM | POA: Diagnosis not present

## 2021-09-06 DIAGNOSIS — M542 Cervicalgia: Secondary | ICD-10-CM | POA: Insufficient documentation

## 2021-09-06 DIAGNOSIS — Z7901 Long term (current) use of anticoagulants: Secondary | ICD-10-CM | POA: Diagnosis not present

## 2021-09-06 DIAGNOSIS — R0789 Other chest pain: Secondary | ICD-10-CM | POA: Insufficient documentation

## 2021-09-06 DIAGNOSIS — S43205A Unspecified dislocation of left sternoclavicular joint, initial encounter: Secondary | ICD-10-CM | POA: Insufficient documentation

## 2021-09-06 DIAGNOSIS — I6381 Other cerebral infarction due to occlusion or stenosis of small artery: Secondary | ICD-10-CM | POA: Diagnosis not present

## 2021-09-06 DIAGNOSIS — R55 Syncope and collapse: Secondary | ICD-10-CM | POA: Diagnosis not present

## 2021-09-06 DIAGNOSIS — R000 Tachycardia, unspecified: Secondary | ICD-10-CM | POA: Diagnosis not present

## 2021-09-06 DIAGNOSIS — M5134 Other intervertebral disc degeneration, thoracic region: Secondary | ICD-10-CM | POA: Diagnosis not present

## 2021-09-06 DIAGNOSIS — S4992XA Unspecified injury of left shoulder and upper arm, initial encounter: Secondary | ICD-10-CM | POA: Diagnosis present

## 2021-09-06 DIAGNOSIS — S43204A Unspecified dislocation of right sternoclavicular joint, initial encounter: Secondary | ICD-10-CM | POA: Diagnosis not present

## 2021-09-06 DIAGNOSIS — S0990XA Unspecified injury of head, initial encounter: Secondary | ICD-10-CM | POA: Insufficient documentation

## 2021-09-06 DIAGNOSIS — I959 Hypotension, unspecified: Secondary | ICD-10-CM | POA: Diagnosis not present

## 2021-09-06 DIAGNOSIS — Z041 Encounter for examination and observation following transport accident: Secondary | ICD-10-CM | POA: Diagnosis not present

## 2021-09-06 DIAGNOSIS — S3991XA Unspecified injury of abdomen, initial encounter: Secondary | ICD-10-CM | POA: Insufficient documentation

## 2021-09-06 DIAGNOSIS — M47816 Spondylosis without myelopathy or radiculopathy, lumbar region: Secondary | ICD-10-CM | POA: Diagnosis not present

## 2021-09-06 DIAGNOSIS — S0083XA Contusion of other part of head, initial encounter: Secondary | ICD-10-CM | POA: Diagnosis not present

## 2021-09-06 DIAGNOSIS — I700 Atherosclerosis of aorta: Secondary | ICD-10-CM | POA: Diagnosis not present

## 2021-09-06 DIAGNOSIS — M7981 Nontraumatic hematoma of soft tissue: Secondary | ICD-10-CM | POA: Diagnosis not present

## 2021-09-06 DIAGNOSIS — R079 Chest pain, unspecified: Secondary | ICD-10-CM | POA: Diagnosis not present

## 2021-09-06 DIAGNOSIS — T148XXA Other injury of unspecified body region, initial encounter: Secondary | ICD-10-CM

## 2021-09-06 DIAGNOSIS — M47812 Spondylosis without myelopathy or radiculopathy, cervical region: Secondary | ICD-10-CM | POA: Diagnosis not present

## 2021-09-06 DIAGNOSIS — S199XXA Unspecified injury of neck, initial encounter: Secondary | ICD-10-CM | POA: Diagnosis not present

## 2021-09-06 DIAGNOSIS — T1490XA Injury, unspecified, initial encounter: Secondary | ICD-10-CM

## 2021-09-06 DIAGNOSIS — R609 Edema, unspecified: Secondary | ICD-10-CM | POA: Diagnosis not present

## 2021-09-06 DIAGNOSIS — G9389 Other specified disorders of brain: Secondary | ICD-10-CM | POA: Diagnosis not present

## 2021-09-06 LAB — CBC
HCT: 36.5 % (ref 36.0–46.0)
Hemoglobin: 11.5 g/dL — ABNORMAL LOW (ref 12.0–15.0)
MCH: 23.2 pg — ABNORMAL LOW (ref 26.0–34.0)
MCHC: 31.5 g/dL (ref 30.0–36.0)
MCV: 73.6 fL — ABNORMAL LOW (ref 80.0–100.0)
Platelets: 276 10*3/uL (ref 150–400)
RBC: 4.96 MIL/uL (ref 3.87–5.11)
RDW: 16.1 % — ABNORMAL HIGH (ref 11.5–15.5)
WBC: 6.9 10*3/uL (ref 4.0–10.5)
nRBC: 0 % (ref 0.0–0.2)

## 2021-09-06 LAB — COMPREHENSIVE METABOLIC PANEL
ALT: 20 U/L (ref 0–44)
AST: 29 U/L (ref 15–41)
Albumin: 3.3 g/dL — ABNORMAL LOW (ref 3.5–5.0)
Alkaline Phosphatase: 68 U/L (ref 38–126)
Anion gap: 14 (ref 5–15)
BUN: 16 mg/dL (ref 6–20)
CO2: 22 mmol/L (ref 22–32)
Calcium: 8.9 mg/dL (ref 8.9–10.3)
Chloride: 103 mmol/L (ref 98–111)
Creatinine, Ser: 1.25 mg/dL — ABNORMAL HIGH (ref 0.44–1.00)
GFR, Estimated: 51 mL/min — ABNORMAL LOW (ref >60)
Glucose, Bld: 95 mg/dL (ref 70–99)
Potassium: 3.2 mmol/L — ABNORMAL LOW (ref 3.5–5.1)
Sodium: 139 mmol/L (ref 135–145)
Total Bilirubin: 0.3 mg/dL (ref 0.3–1.2)
Total Protein: 8 g/dL (ref 6.5–8.1)

## 2021-09-06 LAB — I-STAT CHEM 8, ED
BUN: 18 mg/dL (ref 6–20)
Calcium, Ion: 1.03 mmol/L — ABNORMAL LOW (ref 1.15–1.40)
Chloride: 104 mmol/L (ref 98–111)
Creatinine, Ser: 1.1 mg/dL — ABNORMAL HIGH (ref 0.44–1.00)
Glucose, Bld: 98 mg/dL (ref 70–99)
HCT: 33 % — ABNORMAL LOW (ref 36.0–46.0)
Hemoglobin: 11.2 g/dL — ABNORMAL LOW (ref 12.0–15.0)
Potassium: 2.9 mmol/L — ABNORMAL LOW (ref 3.5–5.1)
Sodium: 141 mmol/L (ref 135–145)
TCO2: 27 mmol/L (ref 22–32)

## 2021-09-06 LAB — ETHANOL: Alcohol, Ethyl (B): <10 mg/dL (ref <10)

## 2021-09-06 LAB — PROTIME-INR
INR: 1.8 — ABNORMAL HIGH (ref 0.8–1.2)
Prothrombin Time: 20.7 seconds — ABNORMAL HIGH (ref 11.4–15.2)

## 2021-09-06 LAB — LACTIC ACID, PLASMA: Lactic Acid, Venous: 2.2 mmol/L (ref 0.5–1.9)
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
## 2021-09-06 IMAGING — CT CT ABD-PELV W/ CM
2 of 5 series · 15 of 46 positions shown, 17 images · IV contrast (APPLIED)
Comparison: CT dated [DATE].

CLINICAL DATA: Trauma.

EXAM:
CT CHEST, ABDOMEN, AND PELVIS WITH CONTRAST
TECHNIQUE: Multidetector CT imaging of the chest, abdomen and pelvis was
performed following the standard protocol during bolus
administration of intravenous contrast.
CONTRAST:  85mL OMNIPAQUE IOHEXOL 350 MG/ML SOLN

[Series 4: cap 5.0 i31f 2 (person_name) · axial · 0.91mm/px · z∈[-821,-286]mm · 12 of 124 slices shown, 14 images]
[im 10/124  soft-tissue]
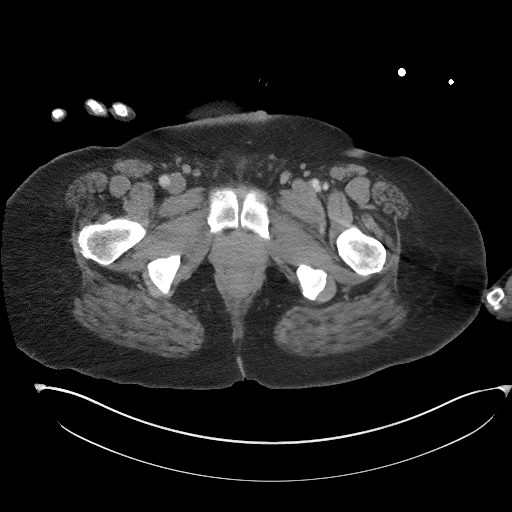
[im 10/124  bone]
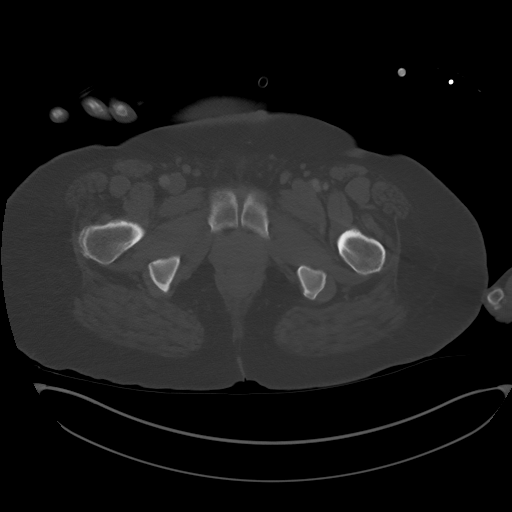
[im 19/124  soft-tissue]
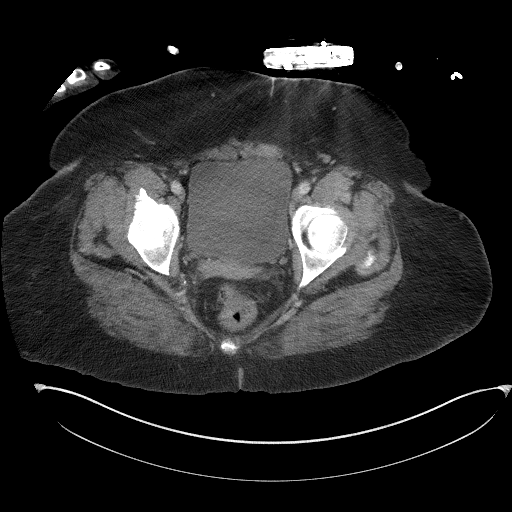
[im 29/124  soft-tissue]
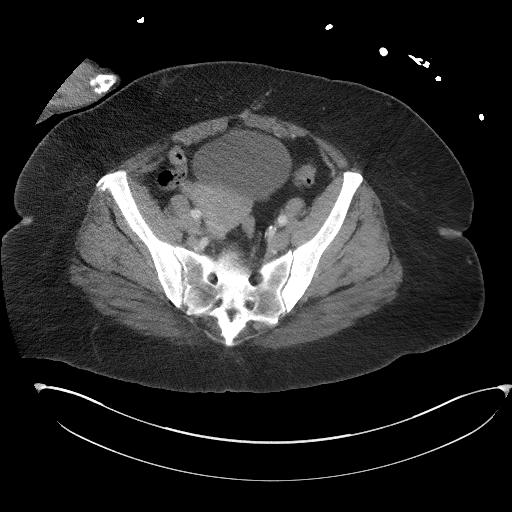
[im 38/124  soft-tissue]
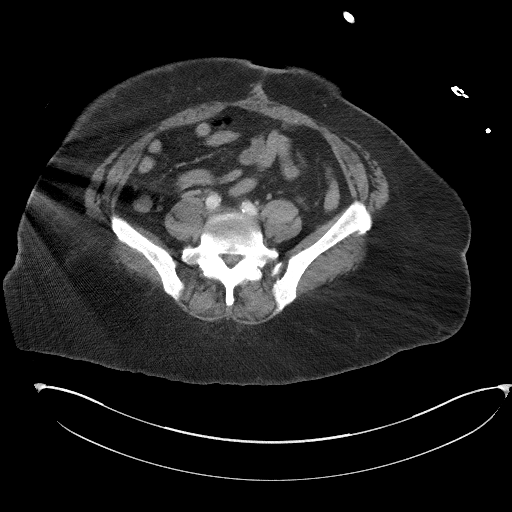
[im 48/124  soft-tissue]
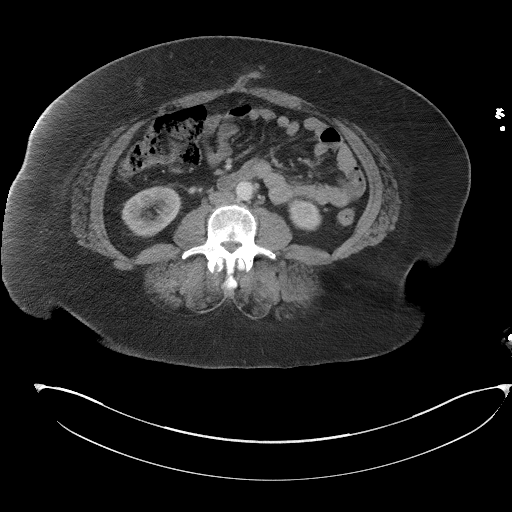
[im 57/124  soft-tissue]
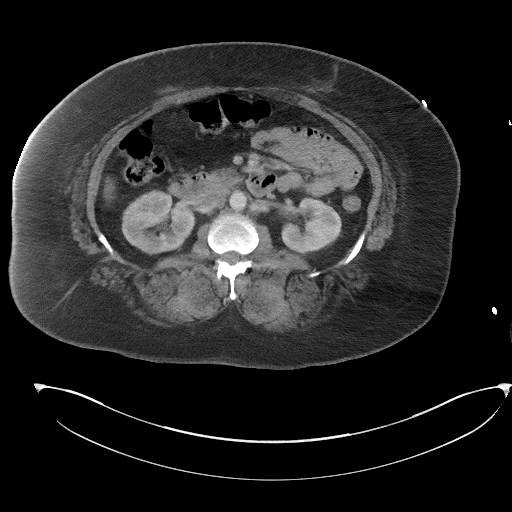
[im 67/124  soft-tissue]
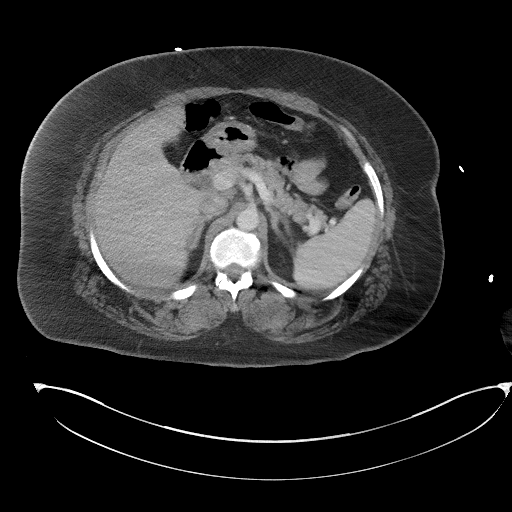
[im 76/124  soft-tissue]
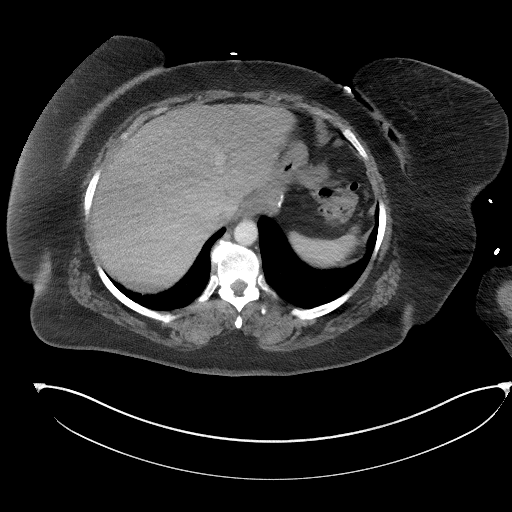
[im 86/124  soft-tissue]
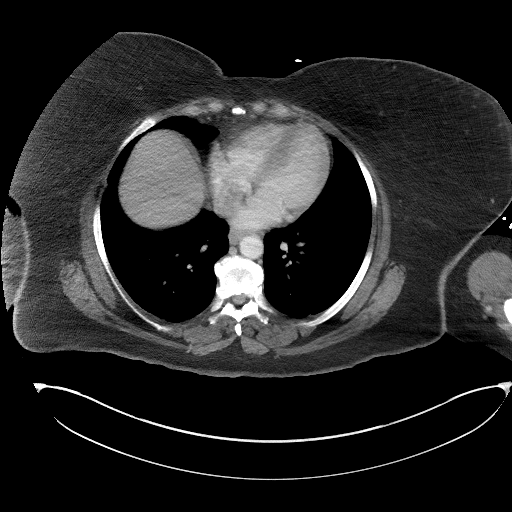
[im 86/124  bone]
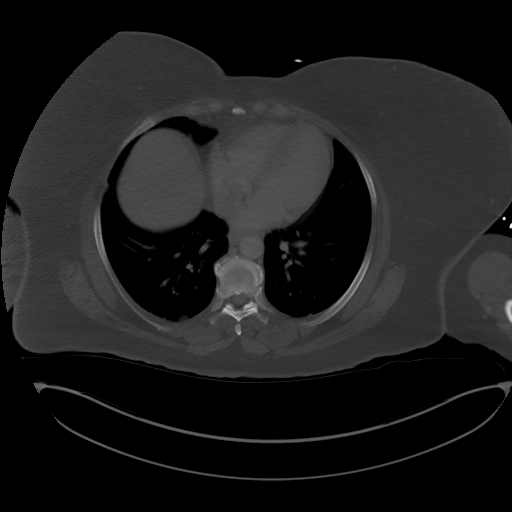
[im 95/124  soft-tissue]
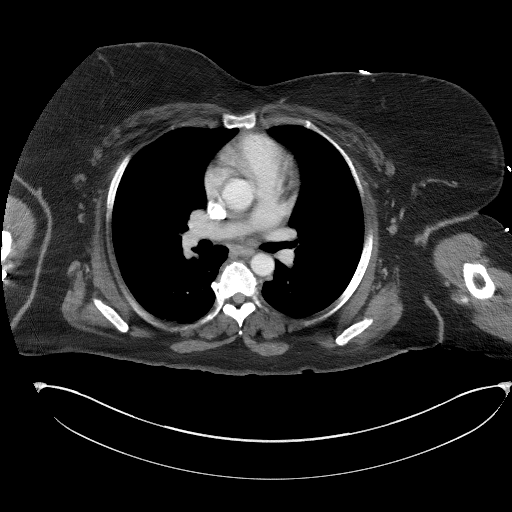
[im 105/124  soft-tissue]
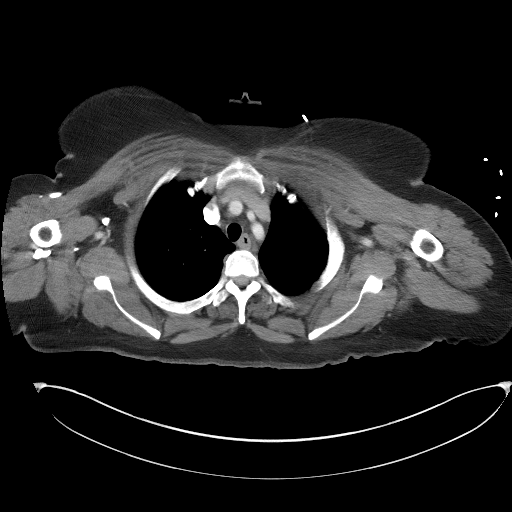
[im 114/124  soft-tissue]
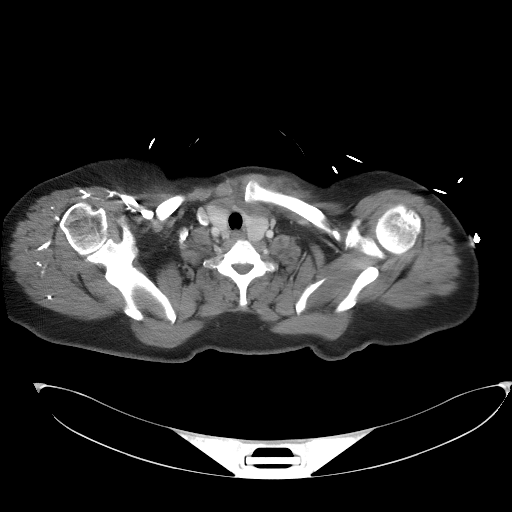

[Series 7: coronal · coronal · 0.91mm/px · 3 of 173 slices shown]
[im 58/173  soft-tissue]
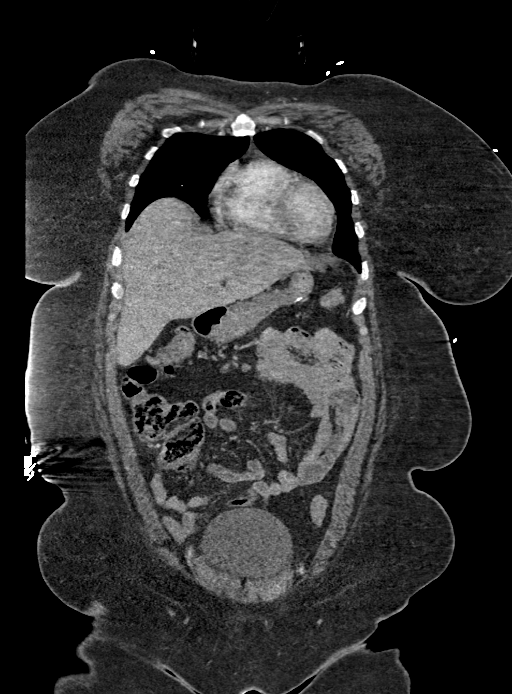
[im 77/173  soft-tissue]
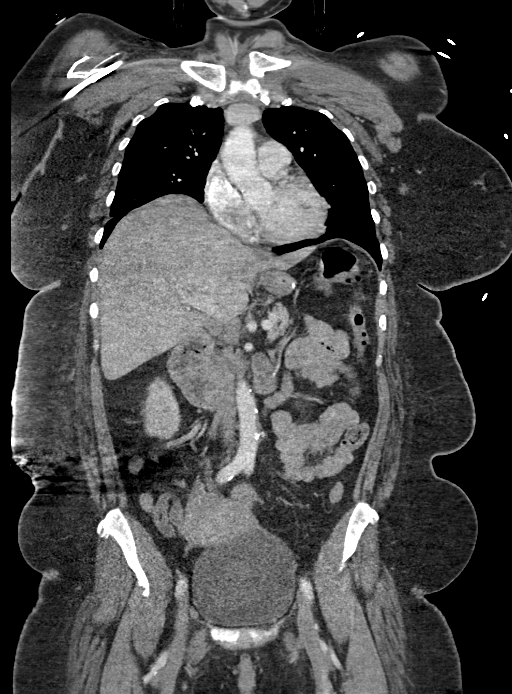
[im 96/173  soft-tissue]
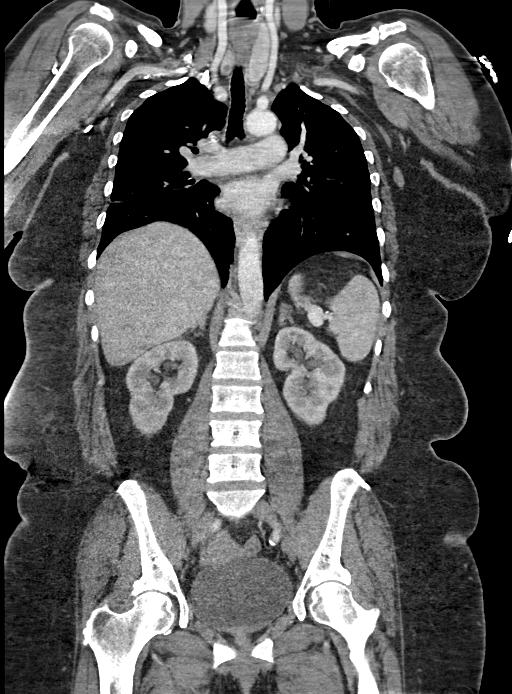

[15 of 46 positions shown; findings below may reference images not displayed]

FINDINGS: CT CHEST FINDINGS

Cardiovascular: There is no cardiomegaly or pericardial effusion.
The thoracic aorta is unremarkable. The origins of the great vessels
of the aortic arch appear patent. There is mild dilatation of the
main pulmonary trunk suggestive of pulmonary hypertension. The
central pulmonary arteries appear unremarkable.

Mediastinum/Nodes: No hilar or mediastinal adenopathy. The esophagus
and the thyroid gland are grossly unremarkable. No mediastinal fluid
collection.

Lungs/Pleura: Minimal bibasilar atelectasis. No focal consolidation,
pleural effusion, or pneumothorax. The central airways are patent.

Musculoskeletal: Degenerative changes of the spine. There is
superior subluxation of the left clavicular head at the
sternoclavicular junction. There is a small adjacent hemorrhage. No
other acute osseous pathology.

CT ABDOMEN PELVIS FINDINGS

No intra-abdominal free air or free fluid.

Hepatobiliary: The liver is unremarkable. No intrahepatic biliary
ductal dilatation. Cholecystectomy.

Pancreas: Unremarkable. No pancreatic ductal dilatation or
surrounding inflammatory changes.

Spleen: A subcentimeter enhancing focus in the superior aspect of
the spleen is not characterized, likely a hemangioma.

Adrenals/Urinary Tract: The adrenal glands are unremarkable. There
is no hydronephrosis on either side. There is symmetric enhancement
and excretion of contrast by both kidneys. Visualized ureters and
urinary bladder appear unremarkable.

Stomach/Bowel: Postsurgical changes of the stomach. There is no
bowel obstruction or active inflammation. The appendix is normal.

Vascular/Lymphatic: Mild aortoiliac atherosclerotic disease. The IVC
is unremarkable. No portal venous gas. There is no adenopathy.

Reproductive: The uterus is anteverted and grossly unremarkable. No
adnexal masses.

Other: Midline vertical anterior pelvic wall incisional scar.

Musculoskeletal: Degenerative changes of the spine. No acute osseous
pathology.
IMPRESSION: 1. Superior subluxation of the left clavicular head at the
sternoclavicular junction.
2. No other acute/traumatic intrathoracic, abdominal, or pelvic
pathology.
3. Aortic Atherosclerosis ([JS]-[JS]).

## 2021-09-06 MED ORDER — IOHEXOL 350 MG/ML SOLN
85.0000 mL | Freq: Once | INTRAVENOUS | Status: AC | PRN
  Administered 2021-09-06: 85 mL via INTRAVENOUS

## 2021-09-06 MED ORDER — METHOCARBAMOL 500 MG PO TABS: 1000.0000 mg | ORAL_TABLET | Freq: Two times a day (BID) | ORAL | 0 refills | Status: DC | PRN

## 2021-09-06 MED ORDER — TETANUS-DIPHTH-ACELL PERTUSSIS 5-2.5-18.5 LF-MCG/0.5 IM SUSY
0.5000 mL | PREFILLED_SYRINGE | Freq: Once | INTRAMUSCULAR | Status: AC
  Administered 2021-09-06: 0.5 mL via INTRAMUSCULAR
  Filled 2021-09-06: qty 0.5

## 2021-09-06 MED ORDER — OXYCODONE-ACETAMINOPHEN 5-325 MG PO TABS
1.0000 | ORAL_TABLET | Freq: Once | ORAL | Status: AC
  Administered 2021-09-06: 1 via ORAL
  Filled 2021-09-06: qty 1

## 2021-09-06 MED ORDER — MORPHINE SULFATE (PF) 4 MG/ML IV SOLN: 4.0000 mg | Freq: Once | INTRAVENOUS | Status: DC

## 2021-09-06 MED ORDER — SODIUM CHLORIDE 0.9 % IV BOLUS
1000.0000 mL | Freq: Once | INTRAVENOUS | Status: AC
  Administered 2021-09-06: 1000 mL via INTRAVENOUS

## 2021-09-06 MED ORDER — ACETAMINOPHEN 325 MG PO TABS
650.0000 mg | ORAL_TABLET | Freq: Once | ORAL | Status: AC
  Administered 2021-09-06: 650 mg via ORAL
  Filled 2021-09-06: qty 2

## 2021-09-06 MED ORDER — ONDANSETRON HCL 4 MG/2ML IJ SOLN: 4.0000 mg | Freq: Once | INTRAMUSCULAR | Status: DC

## 2021-09-06 MED ORDER — POTASSIUM CHLORIDE CRYS ER 20 MEQ PO TBCR
40.0000 meq | EXTENDED_RELEASE_TABLET | Freq: Once | ORAL | Status: AC
  Administered 2021-09-06: 40 meq via ORAL
  Filled 2021-09-06: qty 2
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
## 2021-09-06 NOTE — Progress Notes (Signed)
   09/06/21 1747  Clinical Encounter Type  Visited With Patient not available  Visit Type Trauma  Referral From Nurse  Consult/Referral To Chaplain   Chaplain responded to level 2 page. The patient is being attended to by the medical team.   There is no support person currently here. Chaplain remains available for follow-up spiritual/emotional support as needed. This note was prepared by Jeanine Luz, M.Div..  For questions please contact by phone [phone].

## 2021-09-07 LAB — SAMPLE TO BLOOD BANK

## 2021-09-07 NOTE — Progress Notes (Signed)
Orthopedic Tech Progress Note Patient Details:  ANGENI CHAUDHURI May 25, 1966 [id number]  Ortho Devices Type of Ortho Device: Shoulder immobilizer Ortho Device/Splint Location: LUE Ortho Device/Splint Interventions: Ordered, Application, Adjustment   Post Interventions Patient Tolerated: Well Instructions Provided: Care of device, Adjustment of device, Poper ambulation with device  Diaz Crago 09/07/2021, 2:32 AM

## 2021-09-08 ENCOUNTER — Other Ambulatory Visit: Payer: Self-pay

## 2021-09-08 ENCOUNTER — Ambulatory Visit (INDEPENDENT_AMBULATORY_CARE_PROVIDER_SITE_OTHER): Payer: Managed Care, Other (non HMO)

## 2021-09-08 DIAGNOSIS — Z8673 Personal history of transient ischemic attack (TIA), and cerebral infarction without residual deficits: Secondary | ICD-10-CM

## 2021-09-08 DIAGNOSIS — Z5181 Encounter for therapeutic drug level monitoring: Secondary | ICD-10-CM | POA: Diagnosis not present

## 2021-09-08 LAB — POCT INR: INR: 2.2 (ref 2.0–3.0)

## 2021-09-08 NOTE — Patient Instructions (Signed)
-   take extra 1/2 tablet tonight, then - continue warfarin dosage of 1 pill (5 mg) every day except 1/2 tablets (2.5 mg) on Mondays & Fridays.  - recheck in 6 weeks.

## 2021-09-14 ENCOUNTER — Telehealth: Payer: Self-pay | Admitting: Family Medicine
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
## 2021-09-15 ENCOUNTER — Ambulatory Visit: Payer: Medicare Other | Attending: Physician Assistant | Admitting: Physician Assistant

## 2021-09-15 ENCOUNTER — Encounter: Payer: Self-pay | Admitting: Physician Assistant

## 2021-09-15 ENCOUNTER — Other Ambulatory Visit: Payer: Self-pay

## 2021-09-15 VITALS — BP 132/65 | HR 67 | Resp 16 | Wt 231.2 lb

## 2021-09-15 DIAGNOSIS — Z09 Encounter for follow-up examination after completed treatment for conditions other than malignant neoplasm: Secondary | ICD-10-CM | POA: Diagnosis not present

## 2021-09-15 DIAGNOSIS — E876 Hypokalemia: Secondary | ICD-10-CM | POA: Diagnosis not present

## 2021-09-15 DIAGNOSIS — D509 Iron deficiency anemia, unspecified: Secondary | ICD-10-CM | POA: Diagnosis not present

## 2021-09-15 DIAGNOSIS — S43102D Unspecified dislocation of left acromioclavicular joint, subsequent encounter: Secondary | ICD-10-CM | POA: Diagnosis not present
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
## 2021-09-15 NOTE — Telephone Encounter (Signed)
Noted. Pt has appointment today.

## 2021-09-16 ENCOUNTER — Ambulatory Visit (HOSPITAL_COMMUNITY)
Admission: RE | Admit: 2021-09-16 | Discharge: 2021-09-16 | Disposition: A | Discharge status: Home / Self Care | Payer: Managed Care, Other (non HMO) | Source: Ambulatory Visit | Attending: Orthopedic Surgery | Admitting: Orthopedic Surgery

## 2021-09-16 ENCOUNTER — Ambulatory Visit (HOSPITAL_COMMUNITY): Payer: Managed Care, Other (non HMO)

## 2021-09-16 ENCOUNTER — Encounter (HOSPITAL_COMMUNITY)
Admission: RE | Disposition: A | Discharge status: Home / Self Care | Payer: Self-pay | Source: Ambulatory Visit | Attending: Orthopedic Surgery

## 2021-09-16 ENCOUNTER — Ambulatory Visit (INDEPENDENT_AMBULATORY_CARE_PROVIDER_SITE_OTHER): Payer: Medicare Other | Admitting: Orthopedic Surgery

## 2021-09-16 ENCOUNTER — Ambulatory Visit: Payer: Self-pay

## 2021-09-16 ENCOUNTER — Encounter: Payer: Self-pay | Admitting: Orthopedic Surgery

## 2021-09-16 ENCOUNTER — Ambulatory Visit (HOSPITAL_COMMUNITY): Payer: Managed Care, Other (non HMO) | Admitting: Anesthesiology

## 2021-09-16 ENCOUNTER — Encounter (HOSPITAL_COMMUNITY): Payer: Self-pay | Admitting: Orthopedic Surgery

## 2021-09-16 DIAGNOSIS — Z87891 Personal history of nicotine dependence: Secondary | ICD-10-CM | POA: Insufficient documentation

## 2021-09-16 DIAGNOSIS — G4730 Sleep apnea, unspecified: Secondary | ICD-10-CM | POA: Diagnosis not present

## 2021-09-16 DIAGNOSIS — Z79899 Other long term (current) drug therapy: Secondary | ICD-10-CM | POA: Diagnosis not present

## 2021-09-16 DIAGNOSIS — S43205A Unspecified dislocation of left sternoclavicular joint, initial encounter: Secondary | ICD-10-CM | POA: Diagnosis not present

## 2021-09-16 DIAGNOSIS — Z7901 Long term (current) use of anticoagulants: Secondary | ICD-10-CM | POA: Diagnosis not present

## 2021-09-16 DIAGNOSIS — M25512 Pain in left shoulder: Secondary | ICD-10-CM

## 2021-09-16 DIAGNOSIS — Z419 Encounter for procedure for purposes other than remedying health state, unspecified: Secondary | ICD-10-CM

## 2021-09-16 DIAGNOSIS — S43215D Anterior dislocation of left sternoclavicular joint, subsequent encounter: Secondary | ICD-10-CM

## 2021-09-16 DIAGNOSIS — D62 Acute posthemorrhagic anemia: Secondary | ICD-10-CM | POA: Diagnosis not present

## 2021-09-16 DIAGNOSIS — S43215A Anterior dislocation of left sternoclavicular joint, initial encounter: Secondary | ICD-10-CM

## 2021-09-16 DIAGNOSIS — Z8673 Personal history of transient ischemic attack (TIA), and cerebral infarction without residual deficits: Secondary | ICD-10-CM | POA: Diagnosis not present

## 2021-09-16 DIAGNOSIS — I10 Essential (primary) hypertension: Secondary | ICD-10-CM | POA: Diagnosis not present

## 2021-09-16 DIAGNOSIS — S43202A Unspecified subluxation of left sternoclavicular joint, initial encounter: Secondary | ICD-10-CM | POA: Diagnosis not present

## 2021-09-16 HISTORY — PX: CLOSED REDUCTION CLAVICULAR: SHX5006

## 2021-09-16 LAB — CBC WITH DIFFERENTIAL/PLATELET
Basophils Absolute: 0.1 10*3/uL (ref 0.0–0.2)
Basos: 1 %
EOS (ABSOLUTE): 0.1 10*3/uL (ref 0.0–0.4)
Eos: 3 %
Hematocrit: 36 % (ref 34.0–46.6)
Hemoglobin: 11.6 g/dL (ref 11.1–15.9)
Immature Grans (Abs): 0 10*3/uL (ref 0.0–0.1)
Immature Granulocytes: 0 %
Lymphocytes Absolute: 1.6 10*3/uL (ref 0.7–3.1)
Lymphs: 36 %
MCH: 22.7 pg — ABNORMAL LOW (ref 26.6–33.0)
MCHC: 32.2 g/dL (ref 31.5–35.7)
MCV: 70 fL — ABNORMAL LOW (ref 79–97)
Monocytes Absolute: 0.3 10*3/uL (ref 0.1–0.9)
Monocytes: 7 %
Neutrophils Absolute: 2.4 10*3/uL (ref 1.4–7.0)
Neutrophils: 53 %
Platelets: 350 10*3/uL (ref 150–450)
RBC: 5.12 x10E6/uL (ref 3.77–5.28)
RDW: 15.9 % — ABNORMAL HIGH (ref 11.7–15.4)
WBC: 4.6 10*3/uL (ref 3.4–10.8)

## 2021-09-16 LAB — GLUCOSE, CAPILLARY
Glucose-Capillary: 102 mg/dL — ABNORMAL HIGH (ref 70–99)
Glucose-Capillary: 91 mg/dL (ref 70–99)

## 2021-09-16 LAB — PROTIME-INR
INR: 3.5 — ABNORMAL HIGH (ref 0.8–1.2)
Prothrombin Time: 35.3 seconds — ABNORMAL HIGH (ref 11.4–15.2)

## 2021-09-16 LAB — BASIC METABOLIC PANEL
Anion gap: 9 (ref 5–15)
BUN: 17 mg/dL (ref 6–20)
CO2: 27 mmol/L (ref 22–32)
Calcium: 8.8 mg/dL — ABNORMAL LOW (ref 8.9–10.3)
Chloride: 104 mmol/L (ref 98–111)
Creatinine, Ser: 1.29 mg/dL — ABNORMAL HIGH (ref 0.44–1.00)
GFR, Estimated: 49 mL/min — ABNORMAL LOW (ref >60)
Glucose, Bld: 95 mg/dL (ref 70–99)
Potassium: 3.1 mmol/L — ABNORMAL LOW (ref 3.5–5.1)
Sodium: 140 mmol/L (ref 135–145)

## 2021-09-16 IMAGING — RF DG SHOULDER 2+V*L*
1 series · 4 of 4 positions shown · non-contrast
Comparison: [DATE]

CLINICAL DATA: Intraoperative shoulder manipulation

EXAM:
LEFT SHOULDER - 2+ VIEW

[Series 1: run · 4 of 4 slices shown]
[im 1/4]
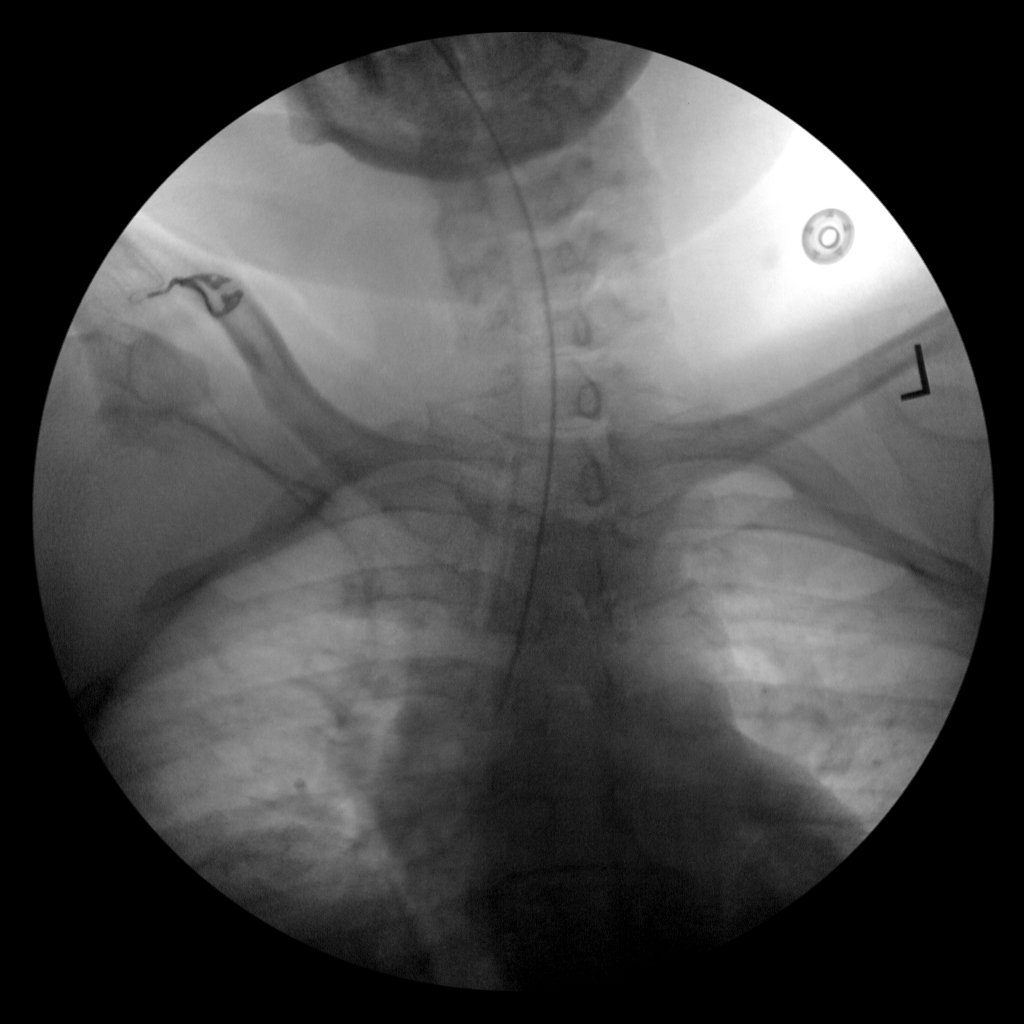
[im 2/4]
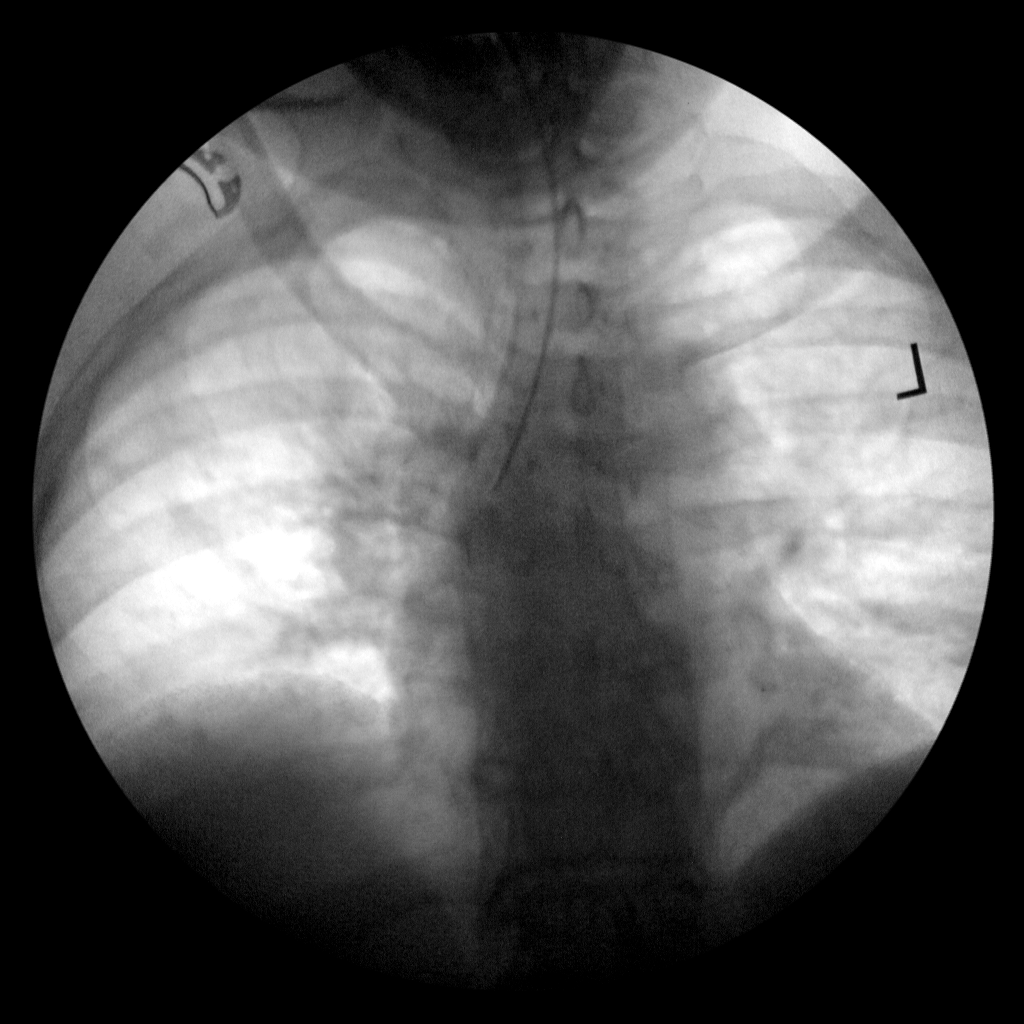
[im 3/4]
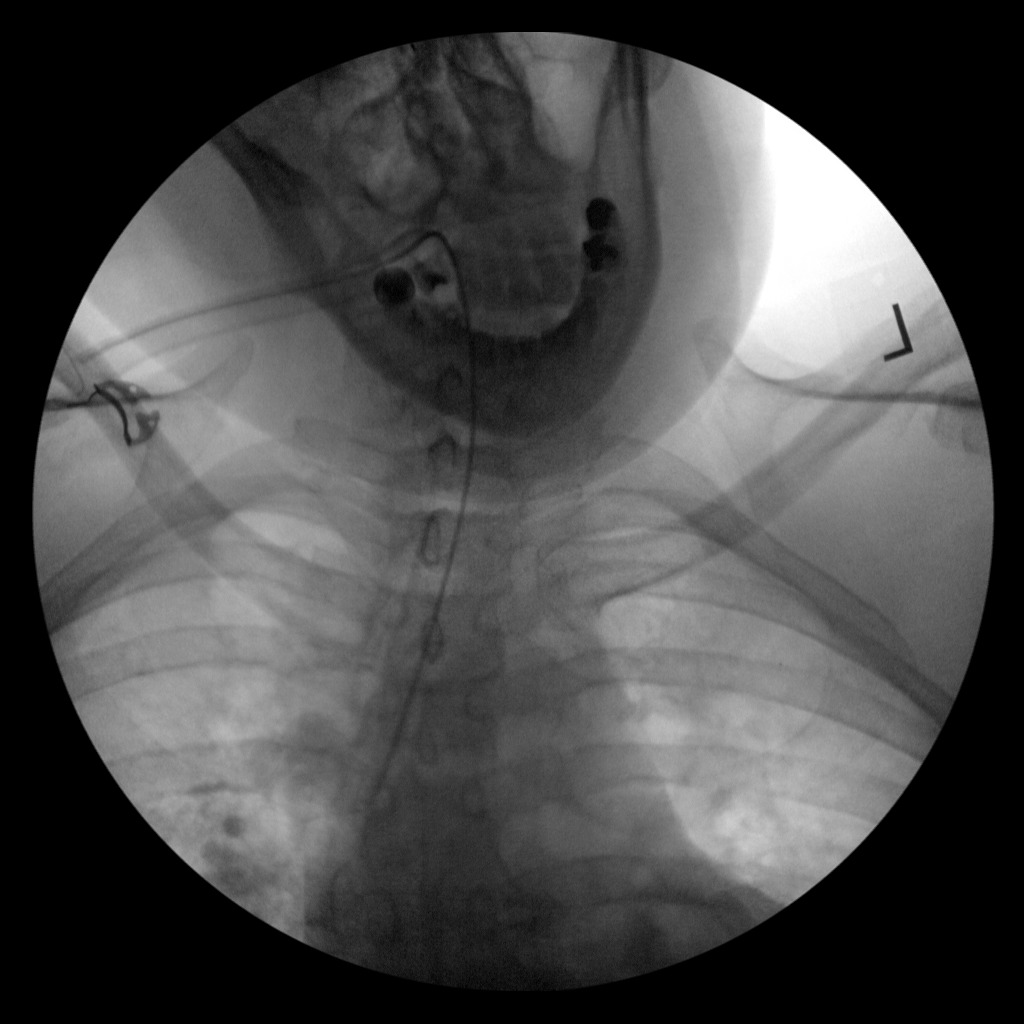
[im 4/4]
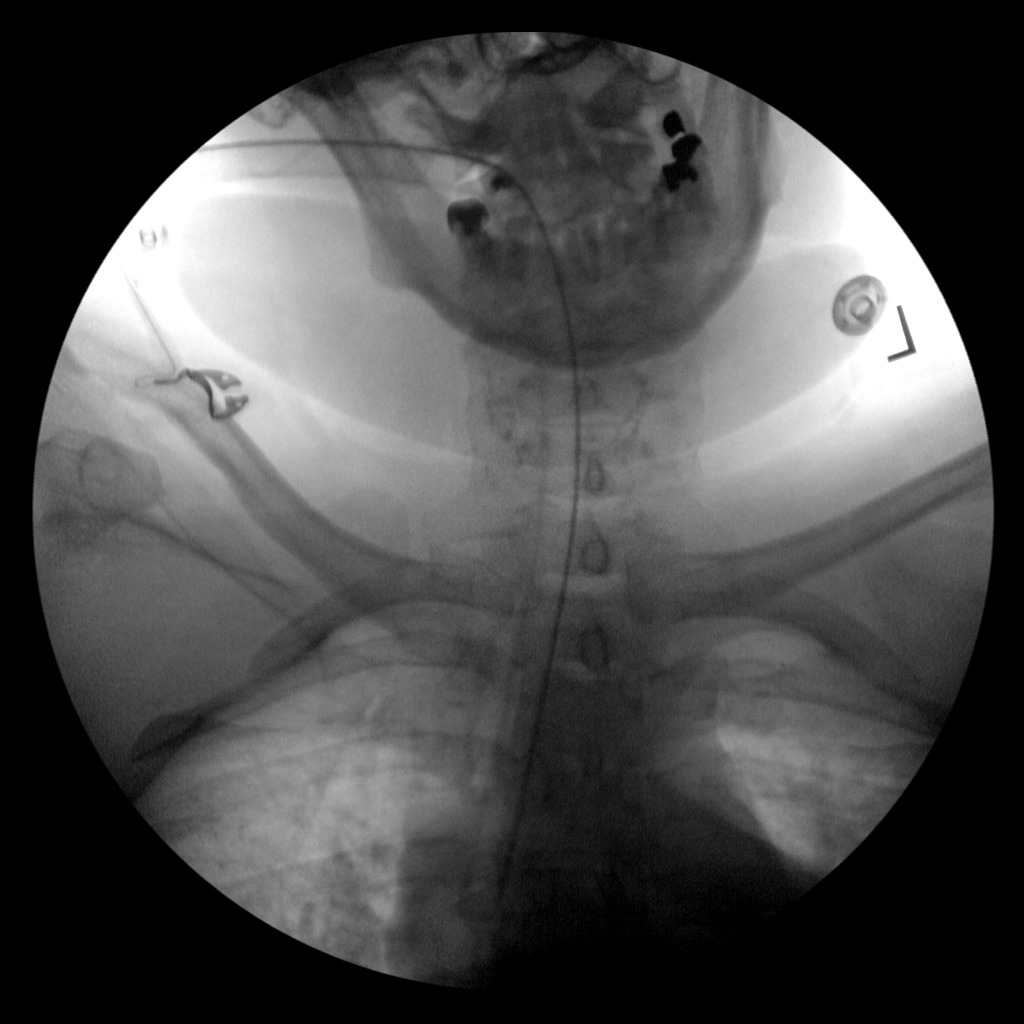

[4 of 4 positions shown; findings below may reference images not displayed]

FINDINGS: Four fluoroscopic images are obtained during the performance of the
procedure and are provided for interpretation only. Endotracheal
tube is identified, tip overlying the right mainstem bronchus. A
subsequent chest x-ray has already been performed demonstrating
extubation. There is superior subluxation or dislocation of the left
sternoclavicular joint. Please refer to operative report.

FLUOROSCOPY TIME:  9 seconds
IMPRESSION: 1. Intraoperative exam as above.

## 2021-09-16 IMAGING — DX DG CHEST 1V PORT
1 series · 1 of 1 positions shown · non-contrast
Comparison: [DATE]

CLINICAL DATA: Left sternoclavicular dislocation status post
intraoperative manipulation

EXAM:
PORTABLE CHEST 1 VIEW

[chest]
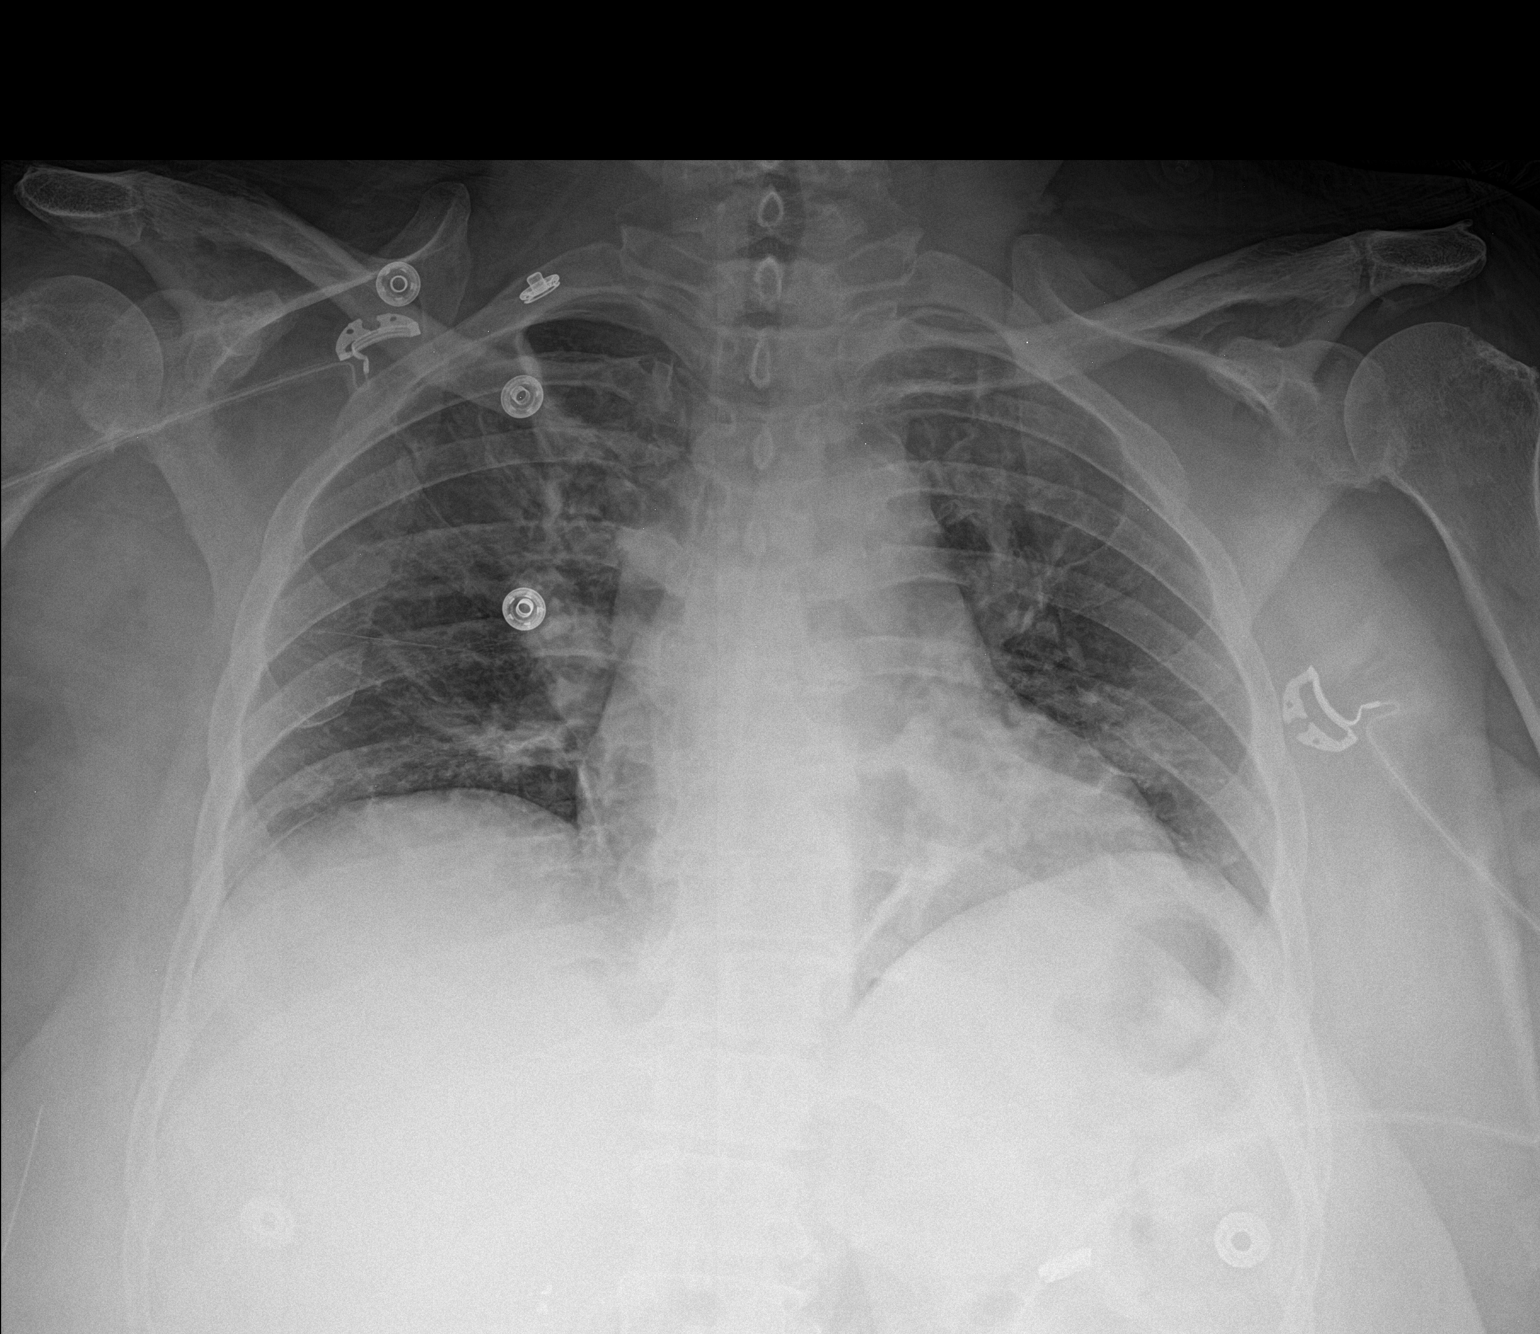

[1 of 1 positions shown; findings below may reference images not displayed]

FINDINGS: Single frontal view of the chest demonstrates persistent superior
displacement of the left clavicle relative to the manubrium. Cardiac
silhouette is unremarkable. Hypoventilatory changes are seen
throughout the lungs. No effusion or pneumothorax.
IMPRESSION: 1. Persistent subluxation or dislocation of the left
sternoclavicular joint.
2. Scattered hypoventilatory changes.

## 2021-09-16 SURGERY — CLOSED REDUCTION, CLAVICLE
Anesthesia: General | Site: Shoulder | Laterality: Left

## 2021-09-16 MED ORDER — LIDOCAINE 2% (20 MG/ML) 5 ML SYRINGE
INTRAMUSCULAR | Status: DC | PRN
  Administered 2021-09-16: 60 mg via INTRAVENOUS

## 2021-09-16 MED ORDER — POVIDONE-IODINE 10 % EX SWAB
2.0000 "application " | Freq: Once | CUTANEOUS | Status: AC
  Administered 2021-09-16: 2 via TOPICAL

## 2021-09-16 MED ORDER — FENTANYL CITRATE (PF) 100 MCG/2ML IJ SOLN: 25.0000 ug | INTRAMUSCULAR | Status: DC | PRN

## 2021-09-16 MED ORDER — OXYCODONE HCL 5 MG PO TABS
ORAL_TABLET | ORAL | Status: AC
  Filled 2021-09-16: qty 1

## 2021-09-16 MED ORDER — PROPOFOL 10 MG/ML IV BOLUS
INTRAVENOUS | Status: DC | PRN
  Administered 2021-09-16: 150 mg via INTRAVENOUS

## 2021-09-16 MED ORDER — ROCURONIUM BROMIDE 10 MG/ML (PF) SYRINGE
PREFILLED_SYRINGE | INTRAVENOUS | Status: DC | PRN
  Administered 2021-09-16: 20 mg via INTRAVENOUS

## 2021-09-16 MED ORDER — OXYCODONE HCL 5 MG/5ML PO SOLN: 5.0000 mg | Freq: Once | ORAL | Status: AC | PRN

## 2021-09-16 MED ORDER — OXYCODONE HCL 5 MG PO TABS
5.0000 mg | ORAL_TABLET | Freq: Once | ORAL | Status: AC | PRN
  Administered 2021-09-16: 5 mg via ORAL

## 2021-09-16 MED ORDER — ACETAMINOPHEN 500 MG PO TABS: 1000.0000 mg | ORAL_TABLET | Freq: Once | ORAL | Status: DC

## 2021-09-16 MED ORDER — PROPOFOL 10 MG/ML IV BOLUS
INTRAVENOUS | Status: AC
  Filled 2021-09-16: qty 20

## 2021-09-16 MED ORDER — BUPIVACAINE-EPINEPHRINE (PF) 0.25% -1:200000 IJ SOLN
INTRAMUSCULAR | Status: AC
  Filled 2021-09-16: qty 30

## 2021-09-16 MED ORDER — LACTATED RINGERS IV SOLN: INTRAVENOUS | Status: DC

## 2021-09-16 MED ORDER — PROMETHAZINE HCL 25 MG/ML IJ SOLN
6.2500 mg | INTRAMUSCULAR | Status: DC | PRN
Start: 2021-09-16 — End: 2021-09-17

## 2021-09-16 MED ORDER — SUCCINYLCHOLINE CHLORIDE 200 MG/10ML IV SOSY
PREFILLED_SYRINGE | INTRAVENOUS | Status: DC | PRN
  Administered 2021-09-16: 100 mg via INTRAVENOUS

## 2021-09-16 MED ORDER — SUGAMMADEX SODIUM 200 MG/2ML IV SOLN
INTRAVENOUS | Status: DC | PRN
  Administered 2021-09-16: 200 mg via INTRAVENOUS

## 2021-09-16 MED ORDER — MIDAZOLAM HCL 2 MG/2ML IJ SOLN
INTRAMUSCULAR | Status: AC
  Filled 2021-09-16: qty 2

## 2021-09-16 MED ORDER — FENTANYL CITRATE (PF) 250 MCG/5ML IJ SOLN
INTRAMUSCULAR | Status: AC
  Filled 2021-09-16: qty 5

## 2021-09-16 MED ORDER — DEXAMETHASONE SODIUM PHOSPHATE 10 MG/ML IJ SOLN
INTRAMUSCULAR | Status: DC | PRN
  Administered 2021-09-16: 5 mg via INTRAVENOUS

## 2021-09-16 MED ORDER — CHLORHEXIDINE GLUCONATE 0.12 % MT SOLN
OROMUCOSAL | Status: AC
  Filled 2021-09-16: qty 15

## 2021-09-16 MED ORDER — ONDANSETRON HCL 4 MG/2ML IJ SOLN
INTRAMUSCULAR | Status: DC | PRN
  Administered 2021-09-16: 4 mg via INTRAVENOUS

## 2021-09-16 MED ORDER — CHLORHEXIDINE GLUCONATE 0.12 % MT SOLN
15.0000 mL | OROMUCOSAL | Status: DC
  Filled 2021-09-16: qty 15

## 2021-09-16 MED ORDER — LACTATED RINGERS IV SOLN: INTRAVENOUS | Status: DC | PRN

## 2021-09-16 MED ORDER — HYDROCODONE-ACETAMINOPHEN 5-325 MG PO TABS: 1.0000 | ORAL_TABLET | Freq: Four times a day (QID) | ORAL | 0 refills | Status: DC | PRN

## 2021-09-16 MED ORDER — CHLORHEXIDINE GLUCONATE 4 % EX LIQD: 60.0000 mL | Freq: Once | CUTANEOUS | Status: DC

## 2021-09-16 MED ORDER — PROPOFOL 10 MG/ML IV BOLUS
INTRAVENOUS | Status: AC
  Filled 2021-09-16: qty 40

## 2021-09-16 MED ORDER — MIDAZOLAM HCL 2 MG/2ML IJ SOLN
INTRAMUSCULAR | Status: DC | PRN
Start: 2021-09-16 — End: 2021-09-16
  Administered 2021-09-16: 2 mg via INTRAVENOUS

## 2021-09-16 SURGICAL SUPPLY — 1 items: SLING ARM IMMOBILIZER LRG (SOFTGOODS) ×1 IMPLANT

## 2021-09-16 NOTE — Brief Op Note (Signed)
   09/16/2021  6:50 PM  PATIENT:  Mary [name]  55 y.o. female  PRE-OPERATIVE DIAGNOSIS:  left shoulder sternoclavicular joint disclocation  POST-OPERATIVE DIAGNOSIS:  left shoulder sternoclavicular joint disclocation  PROCEDURE:  Procedure(s): left shoulder sternoclavicular joint reduction  SURGEON:  Surgeon(s): Marlou Sa Tonna Corner, MD  ASSISTANT: magnant pa  ANESTHESIA:   general  EBL: 0 ml    No intake/output data recorded.  BLOOD ADMINISTERED: none  DRAINS: none   LOCAL MEDICATIONS USED:  none  SPECIMEN:  No Specimen  COUNTS:  YES  TOURNIQUET:  * No tourniquets in log *  DICTATION: .Other Dictation: Dictation Number done  PLAN OF CARE: Discharge to home after PACU  PATIENT DISPOSITION:  PACU - hemodynamically stable
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
## 2021-09-16 NOTE — Anesthesia Postprocedure Evaluation (Signed)
Anesthesia Post Note  Patient: Mary [name]  Procedure(s) Performed: left shoulder sternoclavicular joint reduction (Left: Shoulder)     Patient location during evaluation: PACU Anesthesia Type: General Level of consciousness: awake and alert Pain management: pain level controlled Vital Signs Assessment: post-procedure vital signs reviewed and stable Respiratory status: spontaneous breathing, nonlabored ventilation and respiratory function stable Cardiovascular status: blood pressure returned to baseline and stable Postop Assessment: no apparent nausea or vomiting Anesthetic complications: no   No notable events documented.  Last Vitals:  Vitals:   09/16/21 1925 09/16/21 1930  BP: (!) 142/72 134/76  Pulse: 64 62  Resp: (!) 21 19  Temp: (!) 36.1 C   SpO2: 92% 93%    Last Pain:  Vitals:   09/16/21 1925  TempSrc:   PainSc: 3                  Katalea Ucci,W. EDMOND

## 2021-09-16 NOTE — Progress Notes (Signed)
Dr. Marlou Sa notified of elevated PT/INR-- ok to proceed w/ surgery at this time. No new orders at this time.

## 2021-09-16 NOTE — Transfer of Care (Signed)
Immediate Anesthesia Transfer of Care Note  Patient: Mary [name]  Procedure(s) Performed: left shoulder sternoclavicular joint reduction (Left: Shoulder)  Patient Location: PACU  Anesthesia Type:General  Level of Consciousness: awake and alert   Airway & Oxygen Therapy: Patient Spontanous Breathing and Patient connected to face mask oxygen  Post-op Assessment: Report given to RN and Post -op Vital signs reviewed and stable  Post vital signs: Reviewed and stable  Last Vitals:  Vitals Value Taken Time  BP 126/64 09/16/21 1856  Temp    Pulse 65 09/16/21 1902  Resp 21 09/16/21 1902  SpO2 98 % 09/16/21 1902  Vitals shown include unvalidated device data.  Last Pain:  Vitals:   09/16/21 1421  TempSrc: Oral  PainSc: 0-No pain      Patients Stated Pain Goal: 0 (60/47/99 8721)  Complications: No notable events documented.

## 2021-09-16 NOTE — Anesthesia Procedure Notes (Signed)
Procedure Name: Intubation Date/Time: 09/16/2021 6:10 PM Performed by: Reece Agar, CRNA Pre-anesthesia Checklist: Patient identified, Emergency Drugs available, Suction available and Patient being monitored Patient Re-evaluated:Patient Re-evaluated prior to induction Oxygen Delivery Method: Circle System Utilized Preoxygenation: Pre-oxygenation with 100% oxygen Induction Type: IV induction Ventilation: Mask ventilation without difficulty Laryngoscope Size: Mac and 3 Grade View: Grade I Tube type: Oral Tube size: 7.0 mm Number of attempts: 1 Airway Equipment and Method: Stylet Placement Confirmation: ETT inserted through vocal cords under direct vision, positive ETCO2 and breath sounds checked- equal and bilateral Secured at: 22 cm Tube secured with: Tape Dental Injury: Teeth and Oropharynx as per pre-operative assessment

## 2021-09-16 NOTE — Anesthesia Preprocedure Evaluation (Addendum)
Anesthesia Evaluation  Patient identified by MRN, date of birth, ID band Patient awake    Reviewed: Allergy & Precautions, NPO status , Patient's Chart, lab work & pertinent test results, reviewed documented beta blocker date and time   History of Anesthesia Complications Negative for: history of anesthetic complications  Airway Mallampati: II  TM Distance: >3 FB Neck ROM: Full    Dental  (+) Teeth Intact, Dental Advisory Given   Pulmonary sleep apnea , former smoker,    breath sounds clear to auscultation       Cardiovascular hypertension, Pt. on home beta blockers and Pt. on medications  Rhythm:Regular Rate:Normal  TTE 2021: EF 60-65%, valves ok   Neuro/Psych  Headaches, Depression CVA (on Coumadin)    GI/Hepatic negative GI ROS, Neg liver ROS,   Endo/Other  diabetes, Type 2  Renal/GU Cr 1.29, K 3.1  negative genitourinary   Musculoskeletal Left dislocated clavicle    Abdominal Normal abdominal exam  (+)   Peds  Hematology Hgb 11.6, plt 350k, INR 3.5   Anesthesia Other Findings Day of surgery medications reviewed with patient.  Reproductive/Obstetrics negative OB ROS                            Anesthesia Physical Anesthesia Plan  ASA: 2  Anesthesia Plan: General   Post-op Pain Management:    Induction: Intravenous  PONV Risk Score and Plan: 3 and Treatment may vary due to age or medical condition, Midazolam, Dexamethasone and Ondansetron  Airway Management Planned: Oral ETT  Additional Equipment: None  Intra-op Plan:   Post-operative Plan: Extubation in OR  Informed Consent: I have reviewed the patients History and Physical, chart, labs and discussed the procedure including the risks, benefits and alternatives for the proposed anesthesia with the patient or authorized representative who has indicated his/her understanding and acceptance.     Dental advisory given  Plan  Discussed with: CRNA  Anesthesia Plan Comments:        Anesthesia Quick Evaluation
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
## 2021-09-17 ENCOUNTER — Encounter (HOSPITAL_COMMUNITY): Payer: Self-pay | Admitting: Orthopedic Surgery
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
## 2021-09-24 ENCOUNTER — Other Ambulatory Visit: Payer: Self-pay

## 2021-09-24 ENCOUNTER — Telehealth: Payer: Self-pay | Admitting: Orthopedic Surgery

## 2021-09-24 ENCOUNTER — Ambulatory Visit: Payer: Self-pay

## 2021-09-24 ENCOUNTER — Ambulatory Visit (INDEPENDENT_AMBULATORY_CARE_PROVIDER_SITE_OTHER): Payer: Managed Care, Other (non HMO) | Admitting: Orthopedic Surgery

## 2021-09-24 DIAGNOSIS — M25512 Pain in left shoulder: Secondary | ICD-10-CM | POA: Diagnosis not present

## 2021-09-24 NOTE — Telephone Encounter (Signed)
I tried to call patient, unable to lve msg, mailbox full. I need fax number to fax FMLA forms.

## 2021-09-25 ENCOUNTER — Encounter: Payer: Self-pay | Admitting: Orthopedic Surgery

## 2021-09-25 NOTE — Progress Notes (Signed)
Post-Op Visit Note   Patient: Mary [name]           Date of Birth: January 19, 1966           MRN: [id number] Visit Date: 09/24/2021 PCP: [name], MD   Assessment & Plan:  Chief Complaint:  Chief Complaint  Patient presents with   Other    POST OP- 09/16/21 LEFT [place] JOINT REDUCTION   Visit Diagnoses:  1. Left shoulder pain, unspecified chronicity     Plan: Raizel is a 55 year old patient is a week out from attempted reduction of left shoulder [place] joint.  We did achieve some improvement of the subluxation but not a full reduction.  She has been in a sling.  On exam she has no shortness of breath.  The contour of the [place] joint is improved compared to preoperatively.  Still is having pain in that region.  Motor or sensory function to the hand is intact.  Radiographs do show persistent subluxation.  Plan is 2 weeks to stay in the sling.  FMLA is faxed.  No radiographs needed on return visit.  Follow-Up Instructions: No follow-ups on file.   Orders:  Orders Placed This Encounter  Procedures   XR Clavicle Left   No orders of the defined types were placed in this encounter.   Imaging: No results found.  PMFS History: Patient Active Problem List   Diagnosis Date Noted   Hidradenitis suppurativa 08/02/2021   Aphasia as late effect of cerebrovascular accident 12/18/2019   Porokeratosis 06/10/2019   Plantar flexed metatarsal bone of left foot 06/10/2019   ASCUS of cervix with negative high risk HPV 06/04/2019   Sleep apnea 07/07/2016   Anemia, iron deficiency 06/27/2016   Diabetes mellitus ([place]) 03/09/2016   Acute posthemorrhagic anemia    OB + stool    Benign neoplasm of ascending colon    Sepsis ([place]) 11/09/2015   Fecal occult blood test positive 11/09/2015   Absolute anemia    Cerebral infarction due to embolism of left middle cerebral artery ([place]) 07/16/2015   Antiphospholipid syndrome ([place]) 11/17/2014   Cryptogenic stroke ([place]) 10/08/2014   Essential  hypertension 10/08/2014   Obesity 10/08/2014   Vitamin D deficiency 09/08/2014   Anemia, unspecified 09/08/2014   Severe obesity (BMI >= 40) ([place]) 08/21/2013   Acquired dyslexia 07/12/2013   Aphasia due to stroke 07/12/2013   Headache 07/12/2013   Depression due to stroke 07/12/2013   History of stroke 01/24/2013   HTN (hypertension) 01/24/2013   Past Medical History:  Diagnosis Date   Blood transfusion without reported diagnosis    transfusion December 2016   Clotting disorder ([place])    left leg   Diabetes mellitus without complication ([place])    Hypertension    Other and unspecified ovarian cysts    Stroke ([place])    Vaginal Pap smear, abnormal     Family History  Problem Relation Age of Onset   Diabetes Mellitus II Mother    Diabetes Mother    Stroke Mother    Transient ischemic attack Father    Stroke Father    Stroke Other    Diabetes Other    Diabetes Sister     Past Surgical History:  Procedure Laterality Date   CLOSED REDUCTION CLAVICULAR Left 09/16/2021   Procedure: left shoulder sternoclavicular joint reduction;  Surgeon: Meredith Pel, MD;  Location: [facility];  Service: Orthopedics;  Laterality: Left;   COLONOSCOPY WITH PROPOFOL N/A 11/10/2015   Procedure: COLONOSCOPY WITH  PROPOFOL;  Surgeon: Lucilla Lame, MD;  Location: [facility] ENDOSCOPY;  Service: Endoscopy;  Laterality: N/A;   ESOPHAGOGASTRODUODENOSCOPY (EGD) WITH PROPOFOL N/A 11/10/2015   Procedure: ESOPHAGOGASTRODUODENOSCOPY (EGD) WITH PROPOFOL;  Surgeon: Lucilla Lame, MD;  Location: [facility] ENDOSCOPY;  Service: Endoscopy;  Laterality: N/A;   LAPAROSCOPIC GASTRIC SLEEVE RESECTION  02/17/2016   placement   LOOP RECORDER IMPLANT N/A 04/25/2014   Procedure: LOOP RECORDER IMPLANT;  Surgeon: Coralyn Mark, MD;  Location: [place] CATH LAB;  Service: Cardiovascular;  Laterality: N/A;   LOOP RECORDER REMOVAL N/A 02/01/2018   Procedure: LOOP RECORDER REMOVAL;  Surgeon: Deboraha Sprang, MD;  Location: [place] CV LAB;   Service: Cardiovascular;  Laterality: N/A;   OVARIAN CYST REMOVAL     TEE WITHOUT CARDIOVERSION N/A 01/25/2013   Procedure: TRANSESOPHAGEAL ECHOCARDIOGRAM (TEE);  Surgeon: Birdie Riddle, MD;  Location: [facility] ENDOSCOPY;  Service: Cardiovascular;  Laterality: N/A;   Social History   Occupational History   Not on file  Tobacco Use   Smoking status: Former    Packs/day: 1.00    Years: 10.00    Pack years: 10.00    Types: Cigarettes    Quit date: 07/14/2006    Years since quitting: 15.2   Smokeless tobacco: Never   Tobacco comments:    quit smoking around 2007  Vaping Use   Vaping Use: Never used  Substance and Sexual Activity   Alcohol use: No    Alcohol/week: 0.0 standard drinks   Drug use: No   Sexual activity: Yes

## 2021-09-27 ENCOUNTER — Other Ambulatory Visit: Payer: Self-pay

## 2021-09-27 ENCOUNTER — Telehealth: Payer: Self-pay | Admitting: Orthopedic Surgery

## 2021-09-27 ENCOUNTER — Ambulatory Visit: Payer: Medicare Other | Admitting: Family Medicine

## 2021-09-27 NOTE — Telephone Encounter (Signed)
Forms faxed [phone]

## 2021-09-29 NOTE — Telephone Encounter (Signed)
Copied from [place] [id number]. Topic: Referral - Request for Referral >> Sep 23, 2021 10:14 AM Alanda Slim E wrote: Has patient seen PCP for this complaint? yes *If NO, is insurance requiring patient see PCP for this issue before PCP can refer them? Referral for which specialty: ortho  [facility]erred provider/office[facility] [place]  Reason for referral: Pt wants a second opinion / due to being told by the first ortho office that they couldn't fix the bone

## 2021-09-30 NOTE — Telephone Encounter (Signed)
Referral was placed on 09/15/2021 by PCP

## 2021-10-01 ENCOUNTER — Encounter: Payer: Self-pay | Admitting: *Deleted

## 2021-10-01 DIAGNOSIS — M1711 Unilateral primary osteoarthritis, right knee: Secondary | ICD-10-CM

## 2021-10-02 DIAGNOSIS — S43215A Anterior dislocation of left sternoclavicular joint, initial encounter: Secondary | ICD-10-CM

## 2021-10-05 ENCOUNTER — Other Ambulatory Visit: Payer: Self-pay | Admitting: Physician Assistant

## 2021-10-05 DIAGNOSIS — I10 Essential (primary) hypertension: Secondary | ICD-10-CM

## 2021-10-06 ENCOUNTER — Encounter: Payer: Self-pay | Admitting: Orthopedic Surgery

## 2021-10-06 ENCOUNTER — Ambulatory Visit (INDEPENDENT_AMBULATORY_CARE_PROVIDER_SITE_OTHER): Payer: Managed Care, Other (non HMO) | Admitting: Orthopedic Surgery

## 2021-10-06 ENCOUNTER — Other Ambulatory Visit: Payer: Self-pay

## 2021-10-06 DIAGNOSIS — M25512 Pain in left shoulder: Secondary | ICD-10-CM

## 2021-10-06 NOTE — Progress Notes (Signed)
Post-Op Visit Note   Patient: Mary [name]           Date of Birth: 18-Mar-1966           MRN: [id number] Visit Date: 10/06/2021 PCP: [name], MD   Assessment & Plan:  Chief Complaint:  Chief Complaint  Patient presents with   Left Shoulder - Routine Post Op    09/16/21 LEFT [place] JOINT REDUCTION   Visit Diagnoses:  1. Left shoulder pain, unspecified chronicity     Plan: Mary [name] is a 55 year old patient is now about 3 weeks out left shoulder attempted sternoclavicular joint reduction.  She has been doing well and has improved pain.  Does come out of the sling on occasions.  On exam she does have better passive range of motion of the shoulder today.  Still a little bit touchy to palpation around the [facility] joint but the swelling has diminished.  Plan at this time is 3 more weeks of relative rest with the shoulder in the sling most of the time but not all the time.  No lifting with that left arm.  3-week return for clinical recheck and likely initiation of some therapy at that time to start getting some functional strength back into the arm.  Follow-Up Instructions: Return in about 3 weeks (around 10/27/2021).   Orders:  No orders of the defined types were placed in this encounter.  No orders of the defined types were placed in this encounter.   Imaging: No results found.  PMFS History: Patient Active Problem List   Diagnosis Date Noted   Anterior dislocation of left sternoclavicular joint    Hidradenitis suppurativa 08/02/2021   Aphasia as late effect of cerebrovascular accident 12/18/2019   Porokeratosis 06/10/2019   Plantar flexed metatarsal bone of left foot 06/10/2019   ASCUS of cervix with negative high risk HPV 06/04/2019   Sleep apnea 07/07/2016   Anemia, iron deficiency 06/27/2016   Diabetes mellitus ([place]) 03/09/2016   Acute posthemorrhagic anemia    OB + stool    Benign neoplasm of ascending colon    Sepsis ([place]) 11/09/2015   Fecal occult blood test  positive 11/09/2015   Absolute anemia    Cerebral infarction due to embolism of left middle cerebral artery ([place]) 07/16/2015   Antiphospholipid syndrome ([place]) 11/17/2014   Cryptogenic stroke ([place]) 10/08/2014   Essential hypertension 10/08/2014   Obesity 10/08/2014   Vitamin D deficiency 09/08/2014   Anemia, unspecified 09/08/2014   Severe obesity (BMI >= 40) ([place]) 08/21/2013   Acquired dyslexia 07/12/2013   Aphasia due to stroke 07/12/2013   Headache 07/12/2013   Depression due to stroke 07/12/2013   History of stroke 01/24/2013   HTN (hypertension) 01/24/2013   Past Medical History:  Diagnosis Date   Blood transfusion without reported diagnosis    transfusion December 2016   Clotting disorder ([place])    left leg   Diabetes mellitus without complication ([place])    Hypertension    Other and unspecified ovarian cysts    Stroke ([place])    Vaginal Pap smear, abnormal     Family History  Problem Relation Age of Onset   Diabetes Mellitus II Mother    Diabetes Mother    Stroke Mother    Transient ischemic attack Father    Stroke Father    Stroke Other    Diabetes Other    Diabetes Sister     Past Surgical History:  Procedure Laterality Date   CLOSED REDUCTION CLAVICULAR Left  09/16/2021   Procedure: left shoulder sternoclavicular joint reduction;  Surgeon: Meredith Pel, MD;  Location: [facility];  Service: Orthopedics;  Laterality: Left;   COLONOSCOPY WITH PROPOFOL N/A 11/10/2015   Procedure: COLONOSCOPY WITH PROPOFOL;  Surgeon: Lucilla Lame, MD;  Location: [facility] ENDOSCOPY;  Service: Endoscopy;  Laterality: N/A;   ESOPHAGOGASTRODUODENOSCOPY (EGD) WITH PROPOFOL N/A 11/10/2015   Procedure: ESOPHAGOGASTRODUODENOSCOPY (EGD) WITH PROPOFOL;  Surgeon: Lucilla Lame, MD;  Location: [facility] ENDOSCOPY;  Service: Endoscopy;  Laterality: N/A;   LAPAROSCOPIC GASTRIC SLEEVE RESECTION  02/17/2016   placement   LOOP RECORDER IMPLANT N/A 04/25/2014   Procedure: LOOP RECORDER IMPLANT;  Surgeon: Coralyn Mark, MD;  Location: [place] CATH LAB;  Service: Cardiovascular;  Laterality: N/A;   LOOP RECORDER REMOVAL N/A 02/01/2018   Procedure: LOOP RECORDER REMOVAL;  Surgeon: Deboraha Sprang, MD;  Location: [facility] [place] CV LAB;  Service: Cardiovascular;  Laterality: N/A;   OVARIAN CYST REMOVAL     TEE WITHOUT CARDIOVERSION N/A 01/25/2013   Procedure: TRANSESOPHAGEAL ECHOCARDIOGRAM (TEE);  Surgeon: Birdie Riddle, MD;  Location: [facility] ENDOSCOPY;  Service: Cardiovascular;  Laterality: N/A;   Social History   Occupational History   Not on file  Tobacco Use   Smoking status: Former    Packs/day: 1.00    Years: 10.00    Pack years: 10.00    Types: Cigarettes    Quit date: 07/14/2006    Years since quitting: 15.2   Smokeless tobacco: Never   Tobacco comments:    quit smoking around 2007  Vaping Use   Vaping Use: Never used  Substance and Sexual Activity   Alcohol use: No    Alcohol/week: 0.0 standard drinks   Drug use: No   Sexual activity: Yes

## 2021-10-07 ENCOUNTER — Other Ambulatory Visit: Payer: Self-pay | Admitting: Family Medicine

## 2021-10-11 ENCOUNTER — Other Ambulatory Visit: Payer: Self-pay | Admitting: Family Medicine

## 2021-10-11 ENCOUNTER — Telehealth: Payer: Self-pay | Admitting: *Deleted

## 2021-10-11 ENCOUNTER — Other Ambulatory Visit: Payer: Self-pay

## 2021-10-11 DIAGNOSIS — I10 Essential (primary) hypertension: Secondary | ICD-10-CM

## 2021-10-11 MED ORDER — METOPROLOL TARTRATE 25 MG PO TABS: 25.0000 mg | ORAL_TABLET | Freq: Two times a day (BID) | ORAL | 0 refills | Status: DC

## 2021-10-11 MED ORDER — METOPROLOL TARTRATE 25 MG PO TABS
25.0000 mg | ORAL_TABLET | Freq: Two times a day (BID) | ORAL | 0 refills | Status: DC
  Filled 2021-10-11: qty 60, 30d supply, fill #0
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
## 2021-10-11 NOTE — Telephone Encounter (Signed)
Copied from [place] [id number]. Topic: Quick Communication - Rx Refill/Question >> Oct 11, 2021  9:22 AM Yvette Rack wrote: Medication: metoprolol tartrate (LOPRESSOR) 25 MG tablet  Has the patient contacted their pharmacy? Yes.   (Agent: If no, request that the patient contact the pharmacy for the refill. If patient does not wish to contact the pharmacy document the reason why and proceed with request.) (Agent: If yes, when and what did the pharmacy advise?)  Preferred Pharmacy (with phone number or street name): [place], [place] Phone: [phone]   Fax: [phone]  Has the patient been seen for an appointment in the last year OR does the patient have an upcoming appointment? Yes.    Agent: Please be advised that RX refills may take up to 3 business days. We ask that you follow-up with your pharmacy.

## 2021-10-11 NOTE — Telephone Encounter (Signed)
Requested Prescriptions  Pending Prescriptions Disp Refills  . metoprolol tartrate (LOPRESSOR) 25 MG tablet 90 tablet 0    Sig: Take 1 tablet (25 mg total) by mouth 2 (two) times daily.     Cardiovascular:  Beta Blockers Passed - 10/11/2021  1:21 PM      Passed - Last BP in normal range    BP Readings from Last 1 Encounters:  09/16/21 134/76         Passed - Last Heart Rate in normal range    Pulse Readings from Last 1 Encounters:  09/16/21 62         Passed - Valid encounter within last 6 months    Recent Outpatient Visits          3 weeks ago Closed dislocation of left clavicle, subsequent encounter   [facility], [place], [place]   6 months ago Iron deficiency anemia, unspecified iron deficiency anemia type   [facility] [name], MD   1 year ago Encounter for [employer] annual wellness exam   [place], Charlane Ferretti, MD   1 year ago Type 2 diabetes mellitus with other circulatory complication, without long-term current use of insulin ([place])   [place], Charlane Ferretti, MD   2 years ago [place], [place], MD      Future Appointments            In 1 week [name], MD [place]   In 2 weeks Marlou Sa, [place], MD [facility]

## 2021-10-13 NOTE — Telephone Encounter (Signed)
Pt was called and mailbox is currently full.  Patient can come by office and get paperwork faxed.

## 2021-10-18 ENCOUNTER — Ambulatory Visit: Payer: Managed Care, Other (non HMO) | Attending: Family Medicine | Admitting: Family Medicine

## 2021-10-18 ENCOUNTER — Other Ambulatory Visit: Payer: Self-pay

## 2021-10-18 VITALS — BP 114/75 | HR 65 | Ht 65.0 in | Wt 236.8 lb

## 2021-10-18 DIAGNOSIS — I152 Hypertension secondary to endocrine disorders: Secondary | ICD-10-CM

## 2021-10-18 DIAGNOSIS — Z1211 Encounter for screening for malignant neoplasm of colon: Secondary | ICD-10-CM | POA: Diagnosis not present

## 2021-10-18 DIAGNOSIS — R7303 Prediabetes: Secondary | ICD-10-CM | POA: Diagnosis not present

## 2021-10-18 DIAGNOSIS — I10 Essential (primary) hypertension: Secondary | ICD-10-CM

## 2021-10-18 DIAGNOSIS — Z8673 Personal history of transient ischemic attack (TIA), and cerebral infarction without residual deficits: Secondary | ICD-10-CM

## 2021-10-18 DIAGNOSIS — E1159 Type 2 diabetes mellitus with other circulatory complications: Secondary | ICD-10-CM

## 2021-10-18 LAB — POCT GLYCOSYLATED HEMOGLOBIN (HGB A1C): HbA1c, POC (controlled diabetic range): 6.1 % (ref 0.0–7.0)

## 2021-10-18 MED ORDER — ATORVASTATIN CALCIUM 40 MG PO TABS: 40.0000 mg | ORAL_TABLET | Freq: Every day | ORAL | 1 refills | Status: DC

## 2021-10-18 MED ORDER — AMLODIPINE BESYLATE 10 MG PO TABS: 10.0000 mg | ORAL_TABLET | Freq: Every day | ORAL | 1 refills | Status: DC
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
## 2021-10-18 NOTE — Patient Instructions (Signed)
Stop Hydrochlorothiazide

## 2021-10-19 ENCOUNTER — Other Ambulatory Visit: Payer: Self-pay

## 2021-10-20 ENCOUNTER — Ambulatory Visit (INDEPENDENT_AMBULATORY_CARE_PROVIDER_SITE_OTHER): Payer: Managed Care, Other (non HMO)

## 2021-10-20 ENCOUNTER — Other Ambulatory Visit: Payer: Self-pay

## 2021-10-20 DIAGNOSIS — Z8673 Personal history of transient ischemic attack (TIA), and cerebral infarction without residual deficits: Secondary | ICD-10-CM | POA: Diagnosis not present

## 2021-10-20 DIAGNOSIS — Z5181 Encounter for therapeutic drug level monitoring: Secondary | ICD-10-CM | POA: Diagnosis not present

## 2021-10-20 DIAGNOSIS — I639 Cerebral infarction, unspecified: Secondary | ICD-10-CM

## 2021-10-20 LAB — POCT INR: INR: 5.2 — AB (ref 2.0–3.0)

## 2021-10-20 NOTE — Patient Instructions (Signed)
-   skip warfarin tonight and tomorrow, then  - continue warfarin dosage of 1 pill (5 mg) every day except 1/2 tablets (2.5 mg) on Mondays & Fridays.  - recheck in 4 weeks

## 2021-10-27 ENCOUNTER — Other Ambulatory Visit: Payer: Self-pay

## 2021-10-27 ENCOUNTER — Ambulatory Visit (INDEPENDENT_AMBULATORY_CARE_PROVIDER_SITE_OTHER): Payer: Managed Care, Other (non HMO) | Admitting: Surgical

## 2021-10-27 ENCOUNTER — Encounter: Payer: Self-pay | Admitting: Orthopedic Surgery

## 2021-10-27 DIAGNOSIS — M25512 Pain in left shoulder: Secondary | ICD-10-CM

## 2021-10-30 ENCOUNTER — Encounter: Payer: Self-pay | Admitting: Orthopedic Surgery

## 2021-10-30 NOTE — Progress Notes (Signed)
Post-Op Visit Note   Patient: Mary [name]           Date of Birth: [birth year]           MRN: [id number] Visit Date: 10/27/2021 PCP: [name], MD   Assessment & Plan:  Chief Complaint:  Chief Complaint  Patient presents with   Left Shoulder - Routine Post Op    09/16/21 LEFT [place] JOINT REDUCTION   Visit Diagnoses:  1. Left shoulder pain, unspecified chronicity     Plan: Patient is a 55 year old female who presents s/p left sternoclavicular joint reduction on 09/16/2021.  She states that she "feels a lot better" than her last appointment.  She takes occasional Tylenol only for pain control.  She has decreased swelling over the left sternoclavicular joint.  She is able to occasionally sleep on her left side now.  On examination she has 60 degrees external Tatian, 110 degrees abduction, 150 degrees forward flexion.  5 -/5 infraspinatus strength with 5/5 supraspinatus and subscapularis strength.  She has mild tenderness over the Manteno joint and definitely diminished swelling compared with prior to reduction with fairly equivalent contour compared with contralateral  joint.  Overall she feels that she is definitely progressing well with no progression and no hitting a wall yet.  She will start physical therapy in [place] to focus on left shoulder range of motion and strengthening 1-2 times per week.  Follow-up in 6 weeks for final recheck with Dr. Marlou Sa.  Follow-Up Instructions: No follow-ups on file.   Orders:  Orders Placed This Encounter  Procedures   Ambulatory referral to Physical Therapy   No orders of the defined types were placed in this encounter.   Imaging: No results found.  PMFS History: Patient Active Problem List   Diagnosis Date Noted   Anterior dislocation of left sternoclavicular joint    Hidradenitis suppurativa 08/02/2021   Aphasia as late effect of cerebrovascular accident 12/18/2019   Porokeratosis 06/10/2019   Plantar flexed metatarsal bone of  left foot 06/10/2019   ASCUS of cervix with negative high risk HPV 06/04/2019   Sleep apnea 07/07/2016   Anemia, iron deficiency 06/27/2016   Diabetes mellitus ([place]) 03/09/2016   Acute posthemorrhagic anemia    OB + stool    Benign neoplasm of ascending colon    Sepsis ([place]) 11/09/2015   Fecal occult blood test positive 11/09/2015   Absolute anemia    Cerebral infarction due to embolism of left middle cerebral artery ([place]) 07/16/2015   Antiphospholipid syndrome ([place]) 11/17/2014   Cryptogenic stroke ([place]) 10/08/2014   Essential hypertension 10/08/2014   Obesity 10/08/2014   Vitamin D deficiency 09/08/2014   Anemia, unspecified 09/08/2014   Severe obesity (BMI >= 40) ([place]) 08/21/2013   Acquired dyslexia 07/12/2013   Aphasia due to stroke 07/12/2013   Headache 07/12/2013   Depression due to stroke 07/12/2013   History of stroke 01/24/2013   HTN (hypertension) 01/24/2013   Past Medical History:  Diagnosis Date   Blood transfusion without reported diagnosis    transfusion December 2016   Clotting disorder ([place])    left leg   Diabetes mellitus without complication ([place])    Hypertension    Other and unspecified ovarian cysts    Stroke ([place])    Vaginal Pap smear, abnormal     Family History  Problem Relation Age of Onset   Diabetes Mellitus II Mother    Diabetes Mother    Stroke Mother    Transient ischemic attack Father  Stroke Father    Stroke Other    Diabetes Other    Diabetes Sister     Past Surgical History:  Procedure Laterality Date   CLOSED REDUCTION CLAVICULAR Left 09/16/2021   Procedure: left shoulder sternoclavicular joint reduction;  Surgeon: Meredith Pel, MD;  Location: [place];  Service: Orthopedics;  Laterality: Left;   COLONOSCOPY WITH PROPOFOL N/A 11/10/2015   Procedure: COLONOSCOPY WITH PROPOFOL;  Surgeon: Lucilla Lame, MD;  Location: [facility] ENDOSCOPY;  Service: Endoscopy;  Laterality: N/A;   ESOPHAGOGASTRODUODENOSCOPY (EGD) WITH PROPOFOL N/A  11/10/2015   Procedure: ESOPHAGOGASTRODUODENOSCOPY (EGD) WITH PROPOFOL;  Surgeon: Lucilla Lame, MD;  Location: [facility] ENDOSCOPY;  Service: Endoscopy;  Laterality: N/A;   LAPAROSCOPIC GASTRIC SLEEVE RESECTION  02/17/2016   placement   LOOP RECORDER IMPLANT N/A 04/25/2014   Procedure: LOOP RECORDER IMPLANT;  Surgeon: Coralyn Mark, MD;  Location: [facility] CATH LAB;  Service: Cardiovascular;  Laterality: N/A;   LOOP RECORDER REMOVAL N/A 02/01/2018   Procedure: LOOP RECORDER REMOVAL;  Surgeon: Deboraha Sprang, MD;  Location: [place] CV LAB;  Service: Cardiovascular;  Laterality: N/A;   OVARIAN CYST REMOVAL     TEE WITHOUT CARDIOVERSION N/A 01/25/2013   Procedure: TRANSESOPHAGEAL ECHOCARDIOGRAM (TEE);  Surgeon: Birdie Riddle, MD;  Location: [facility] ENDOSCOPY;  Service: Cardiovascular;  Laterality: N/A;   Social History   Occupational History   Not on file  Tobacco Use   Smoking status: Former    Packs/day: 1.00    Years: 10.00    Pack years: 10.00    Types: Cigarettes    Quit date: 07/14/2006    Years since quitting: 15.3   Smokeless tobacco: Never   Tobacco comments:    quit smoking around 2007  Vaping Use   Vaping Use: Never used  Substance and Sexual Activity   Alcohol use: No    Alcohol/week: 0.0 standard drinks   Drug use: No   Sexual activity: Yes

## 2021-11-02 ENCOUNTER — Ambulatory Visit: Payer: Medicare Other | Attending: Orthopedic Surgery | Admitting: Physical Therapy

## 2021-11-02 ENCOUNTER — Encounter: Payer: Self-pay | Admitting: Physical Therapy

## 2021-11-02 ENCOUNTER — Telehealth: Payer: Self-pay | Admitting: Orthopedic Surgery

## 2021-11-02 ENCOUNTER — Encounter: Payer: Self-pay | Admitting: Orthopedic Surgery

## 2021-11-02 DIAGNOSIS — M25512 Pain in left shoulder: Secondary | ICD-10-CM | POA: Insufficient documentation

## 2021-11-02 DIAGNOSIS — G8929 Other chronic pain: Secondary | ICD-10-CM | POA: Diagnosis present

## 2021-11-02 NOTE — Telephone Encounter (Signed)
Pt called requesting a retur

## 2021-11-02 NOTE — Telephone Encounter (Signed)
IC patient wanted to discuss RTW. I advised I am waiting for response from Dr Dean/Luke.

## 2021-11-03 ENCOUNTER — Encounter: Payer: Self-pay | Admitting: Orthopedic Surgery

## 2021-11-03 ENCOUNTER — Ambulatory Visit: Payer: Medicare Other | Admitting: Physical Therapy
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
## 2021-11-03 NOTE — Telephone Encounter (Signed)
Yeah that should be okay. Let us know if anything changes

## 2021-11-04 ENCOUNTER — Ambulatory Visit: Payer: Medicare Other | Admitting: Physical Therapy

## 2021-11-04 ENCOUNTER — Encounter: Payer: Self-pay | Admitting: Physical Therapy

## 2021-11-04 DIAGNOSIS — M25512 Pain in left shoulder: Secondary | ICD-10-CM | POA: Diagnosis not present

## 2021-11-04 DIAGNOSIS — G8929 Other chronic pain: Secondary | ICD-10-CM
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
## 2021-11-08 ENCOUNTER — Other Ambulatory Visit: Payer: Self-pay

## 2021-11-08 ENCOUNTER — Encounter: Payer: Self-pay | Admitting: Physical Therapy

## 2021-11-08 ENCOUNTER — Ambulatory Visit: Payer: Medicare Other | Admitting: Physical Therapy

## 2021-11-08 ENCOUNTER — Ambulatory Visit: Payer: Managed Care, Other (non HMO) | Attending: Family Medicine

## 2021-11-08 DIAGNOSIS — M25512 Pain in left shoulder: Secondary | ICD-10-CM

## 2021-11-08 DIAGNOSIS — G8929 Other chronic pain: Secondary | ICD-10-CM

## 2021-11-08 DIAGNOSIS — E1159 Type 2 diabetes mellitus with other circulatory complications: Secondary | ICD-10-CM

## 2021-11-08 DIAGNOSIS — I152 Hypertension secondary to endocrine disorders: Secondary | ICD-10-CM
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
## 2021-11-09 ENCOUNTER — Ambulatory Visit: Payer: Managed Care, Other (non HMO) | Admitting: Cardiovascular Disease

## 2021-11-09 LAB — BASIC METABOLIC PANEL
BUN/Creatinine Ratio: 13 (ref 9–23)
BUN: 16 mg/dL (ref 6–24)
CO2: 25 mmol/L (ref 20–29)
Calcium: 9 mg/dL (ref 8.7–10.2)
Chloride: 104 mmol/L (ref 96–106)
Creatinine, Ser: 1.24 mg/dL — ABNORMAL HIGH (ref 0.57–1.00)
Glucose: 87 mg/dL (ref 70–99)
Potassium: 3.6 mmol/L (ref 3.5–5.2)
Sodium: 142 mmol/L (ref 134–144)
eGFR: 51 mL/min/{1.73_m2} — ABNORMAL LOW (ref >59)

## 2021-11-09 NOTE — Progress Notes (Deleted)
Cardiology Office Note  Date:  11/09/2021   ID:  Mary [name], DOB 02-Sep-1966, MRN [id number]  PCP:  [name], MD   No chief complaint on file.   HPI:  Mary [name] is a 55 y.o. female   bariatric surgery. History of stroke Antiphospholipid syndrome,  on warfarin OSA , not on CPAP Previous loop monitor placed,  previous stroke, she has some difficulty with walking.  Who presents for routine follow-up of her stroke  2020 with some chest tightness Cardiac CTA performed with normal coronaries Not having any further symptoms Sx were at rest Symptoms seem to have resolved  Cardiac CTA reviewed with her, calcium score 0, normal coronaries  Previously implanted loop monitor has been replaced  Denies having sleep apnea symptoms Previous records reviewed Ten episodes of 3+ sec pauses noted Unable to afford her CPAP  Active, no regular exercise program Denies shortness of breath, orthopnea  Lab work reviewed with her, previously with low potassium 3, up to 3.3 on repeat, she has stopped her potassium pill  Hemoglobin A1c up to 6.4, watching her diet Also has stopped her cholesterol medication  EKG personally reviewed by myself on todays visit Shows normal sinus rhythm rate 55 bpm no significant ST or T wave changes   PMH:   has a past medical history of Blood transfusion without reported diagnosis, Clotting disorder ([place]), Diabetes mellitus without complication ([place]), Hypertension, Other and unspecified ovarian cysts, Stroke ([place]), and Vaginal Pap smear, abnormal.  PSH:    Past Surgical History:  Procedure Laterality Date   CLOSED REDUCTION CLAVICULAR Left 09/16/2021   Procedure: left shoulder sternoclavicular joint reduction;  Surgeon: Meredith Pel, MD;  Location: [facility];  Service: Orthopedics;  Laterality: Left;   COLONOSCOPY WITH PROPOFOL N/A 11/10/2015   Procedure: COLONOSCOPY WITH PROPOFOL;  Surgeon: Lucilla Lame, MD;  Location: [facility] ENDOSCOPY;   Service: Endoscopy;  Laterality: N/A;   ESOPHAGOGASTRODUODENOSCOPY (EGD) WITH PROPOFOL N/A 11/10/2015   Procedure: ESOPHAGOGASTRODUODENOSCOPY (EGD) WITH PROPOFOL;  Surgeon: Lucilla Lame, MD;  Location: [facility] ENDOSCOPY;  Service: Endoscopy;  Laterality: N/A;   LAPAROSCOPIC GASTRIC SLEEVE RESECTION  02/17/2016   placement   LOOP RECORDER IMPLANT N/A 04/25/2014   Procedure: LOOP RECORDER IMPLANT;  Surgeon: Coralyn Mark, MD;  Location: [facility] CATH LAB;  Service: Cardiovascular;  Laterality: N/A;   LOOP RECORDER REMOVAL N/A 02/01/2018   Procedure: LOOP RECORDER REMOVAL;  Surgeon: Deboraha Sprang, MD;  Location: [facility] CV LAB;  Service: Cardiovascular;  Laterality: N/A;   OVARIAN CYST REMOVAL     TEE WITHOUT CARDIOVERSION N/A 01/25/2013   Procedure: TRANSESOPHAGEAL ECHOCARDIOGRAM (TEE);  Surgeon: Birdie Riddle, MD;  Location: [facility] ENDOSCOPY;  Service: Cardiovascular;  Laterality: N/A;    Current Outpatient Medications  Medication Sig Dispense Refill   amLODipine (NORVASC) 10 MG tablet Take 1 tablet (10 mg total) by mouth daily. 90 tablet 1   atorvastatin (LIPITOR) 40 MG tablet Take 1 tablet (40 mg total) by mouth daily. 90 tablet 1   Calcipotriene-Betameth Diprop (WYNZORA) 0.005-0.064 % CREA Apply 1 application topically as directed. Apply twice daily for two weeks then decrease once daily for 5 days. Avoid Face, groin and underarms. 60 g 2   fluticasone (FLONASE) 50 MCG/ACT nasal spray Place 2 sprays into both nostrils daily. 16 g 6   HYDROcodone-acetaminophen (NORCO/VICODIN) 5-325 MG tablet Take 1 tablet by mouth every 6 (six) hours as needed for moderate pain. 20 tablet 0   Iron, Ferrous Sulfate, 325 (65 Fe)  MG TABS Take 325 mg by mouth daily. 60 tablet 3   methocarbamol (ROBAXIN) 500 MG tablet Take 2 tablets (1,000 mg total) by mouth 2 (two) times daily as needed for muscle spasms. 10 tablet 0   metoprolol tartrate (LOPRESSOR) 25 MG tablet Take 1 tablet (25 mg total) by mouth 2 (two) times daily.  90 tablet 0   potassium chloride (KLOR-CON) 10 MEQ tablet Take 1 tablet (10 mEq total) by mouth 2 (two) times daily for 3 days. 6 tablet 0   warfarin (COUMADIN) 5 MG tablet TAKE 1/2 TO 1 (ONE-HALF TO ONE) TABLET BY MOUTH ONCE DAILY AS DIRECTED BY  COUMADIN  CLINIC 90 tablet 0   warfarin (COUMADIN) 5 MG tablet Take 2.5-5 mg by mouth See admin instructions. Take 2.5mg  by mouth once a day on Monday evening, then 5mg  all other nights of the week     No current facility-administered medications for this visit.     Allergies:   Patient has no known allergies.   Social History:  The patient  reports that she quit smoking about 15 years ago. Her smoking use included cigarettes. She has a 10.00 pack-year smoking history. She has never used smokeless tobacco. She reports that she does not drink alcohol and does not use drugs.   Family History:   family history includes Diabetes in her mother, sister, and another family member; Diabetes Mellitus II in her mother; Stroke in her father, mother, and another family member; Transient ischemic attack in her father.    Review of Systems: Review of Systems  Constitutional: Negative.   HENT: Negative.    Respiratory: Negative.    Cardiovascular: Negative.   Gastrointestinal: Negative.   Musculoskeletal: Negative.   Neurological: Negative.   Psychiatric/Behavioral: Negative.    All other systems reviewed and are negative.  PHYSICAL EXAM: VS:  LMP 04/04/2019 (Approximate)  , BMI There is no height or weight on file to calculate BMI. Constitutional:  oriented to person, place, and time. No distress.  HENT:  Head: Grossly normal Eyes:  no discharge. No scleral icterus.  Neck: No JVD, no carotid bruits  Cardiovascular: Regular rate and rhythm, no murmurs appreciated Pulmonary/Chest: Clear to auscultation bilaterally, no wheezes or rails Abdominal: Soft.  no distension.  no tenderness.  Musculoskeletal: Normal range of motion Neurological:  normal  muscle tone. Coordination normal. No atrophy Skin: Skin warm and dry Psychiatric: normal affect, pleasant  Recent Labs: 09/06/2021: ALT 20 09/15/2021: Hemoglobin 11.6; Platelets 350 11/08/2021: BUN 16; Creatinine, Ser 1.24; Potassium 3.6; Sodium 142    Lipid Panel Lab Results  Component Value Date   CHOL 129 04/01/2021   HDL 37 (L) 04/01/2021   LDLCALC 70 04/01/2021   TRIG 121 04/01/2021      Wt Readings from Last 3 Encounters:  10/18/21 236 lb 12.8 oz (107.4 kg)  09/16/21 220 lb (99.8 kg)  09/15/21 231 lb 3.2 oz (104.9 kg)      ASSESSMENT AND PLAN:  Cryptogenic stroke (HCC) On warfarin for antiphospholipid syndrome Denies any further symptoms Refilled her cholesterol medication  Essential hypertension - Plan: EKG 12-Lead Blood pressure is well controlled on today's visit. No changes made to the medications.  We have refilled her potassium  Antiphospholipid syndrome (HCC) On warfarin as above INR 4.2 today, adjustments made  Sleep apnea, unspecified type Unable to afford her CPAP Having pauses as recorded on loop monitor Reports she is asymptomatic  Loop monitor explant Explanted over a year ago,   Total encounter  time more than 25 minutes  Greater than 50% was spent in counseling and coordination of care with the patient   No orders of the defined types were placed in this encounter.    Signed, Esmond Plants, M.D., Ph.D. 11/09/2021  [place], [place]

## 2021-11-10 ENCOUNTER — Encounter: Payer: Self-pay | Admitting: Cardiovascular Disease

## 2021-11-10 ENCOUNTER — Ambulatory Visit: Payer: Medicare Other | Admitting: Physical Therapy

## 2021-11-15 ENCOUNTER — Ambulatory Visit: Payer: Medicare Other | Admitting: Physical Therapy

## 2021-11-15 ENCOUNTER — Encounter: Payer: Self-pay | Admitting: Physical Therapy

## 2021-11-15 DIAGNOSIS — G8929 Other chronic pain: Secondary | ICD-10-CM

## 2021-11-15 DIAGNOSIS — M25512 Pain in left shoulder: Secondary | ICD-10-CM | POA: Diagnosis not present
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
## 2021-11-17 ENCOUNTER — Ambulatory Visit (INDEPENDENT_AMBULATORY_CARE_PROVIDER_SITE_OTHER): Payer: Managed Care, Other (non HMO)

## 2021-11-17 ENCOUNTER — Ambulatory Visit: Payer: Medicare Other | Admitting: Physical Therapy

## 2021-11-17 ENCOUNTER — Other Ambulatory Visit: Payer: Self-pay

## 2021-11-17 DIAGNOSIS — I639 Cerebral infarction, unspecified: Secondary | ICD-10-CM | POA: Diagnosis not present

## 2021-11-17 DIAGNOSIS — Z5181 Encounter for therapeutic drug level monitoring: Secondary | ICD-10-CM | POA: Diagnosis not present

## 2021-11-17 DIAGNOSIS — Z8673 Personal history of transient ischemic attack (TIA), and cerebral infarction without residual deficits: Secondary | ICD-10-CM

## 2021-11-17 LAB — POCT INR: INR: 5 — AB (ref 2.0–3.0)

## 2021-11-18 ENCOUNTER — Encounter: Payer: Self-pay | Admitting: Physical Therapy

## 2021-11-18 ENCOUNTER — Ambulatory Visit: Payer: Medicare Other | Attending: Orthopedic Surgery | Admitting: Physical Therapy

## 2021-11-18 DIAGNOSIS — M25512 Pain in left shoulder: Secondary | ICD-10-CM | POA: Diagnosis present

## 2021-11-18 DIAGNOSIS — G8929 Other chronic pain: Secondary | ICD-10-CM | POA: Diagnosis present
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
## 2021-11-22 ENCOUNTER — Encounter: Payer: Managed Care, Other (non HMO) | Admitting: Physical Therapy

## 2021-11-22 ENCOUNTER — Encounter: Payer: Self-pay | Admitting: Physical Therapy

## 2021-11-22 ENCOUNTER — Ambulatory Visit: Payer: Medicare Other | Admitting: Physical Therapy

## 2021-11-22 DIAGNOSIS — M25512 Pain in left shoulder: Secondary | ICD-10-CM

## 2021-11-22 DIAGNOSIS — G8929 Other chronic pain: Secondary | ICD-10-CM
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
## 2021-11-23 ENCOUNTER — Encounter: Payer: Managed Care, Other (non HMO) | Admitting: Physical Therapy

## 2021-11-24 ENCOUNTER — Ambulatory Visit: Payer: Medicare Other | Admitting: Physical Therapy

## 2021-11-26 ENCOUNTER — Ambulatory Visit (INDEPENDENT_AMBULATORY_CARE_PROVIDER_SITE_OTHER): Payer: Managed Care, Other (non HMO) | Admitting: Surgical

## 2021-11-26 ENCOUNTER — Encounter: Payer: Self-pay | Admitting: Surgical

## 2021-11-26 ENCOUNTER — Other Ambulatory Visit: Payer: Self-pay

## 2021-11-26 ENCOUNTER — Ambulatory Visit (INDEPENDENT_AMBULATORY_CARE_PROVIDER_SITE_OTHER): Payer: Medicare Other

## 2021-11-26 ENCOUNTER — Telehealth: Payer: Self-pay | Admitting: Orthopedic Surgery

## 2021-11-26 DIAGNOSIS — M25512 Pain in left shoulder: Secondary | ICD-10-CM

## 2021-11-26 NOTE — Telephone Encounter (Signed)
Pt states Lauren F. Will be faxing to her insurance company updated office notes from today. Pt is asking for Ander Purpura F to attach pt insurance claim number and certificate number. Claim number is [id number] ad certificate number is [id number]. Pt phone number is [phone].

## 2021-11-26 NOTE — Progress Notes (Addendum)
Post-Op Visit Note   Patient: Mary [name]           Date of Birth: Aug 04, 1966           MRN: [id number] Visit Date: 11/26/2021 PCP: [name], MD   Assessment & Plan:  Chief Complaint:  Chief Complaint  Patient presents with   Left Shoulder - Routine Post Op   Visit Diagnoses:  1. Left shoulder pain, unspecified chronicity     Plan: Patient is a 55 year old female who presents s/p left shoulder [facility] joint reduction on 09/16/2021.  She was doing very well following reduction until she returned to work and now her pain has flared back up.  She describes a painful "knot" in the area of the [facility] joint where the recurrently subluxed clavicle is superior to the sternum.  She works for [employer] and has found it very painful to return to work.  She also notes in addition to the [facility] joint pain, she has lateral and anterior shoulder pain that travels down her arm around to the elbow.  She was having similar pain prior to the [facility] joint reduction and was considered for an MRI scan of the shoulder but this was deferred to focus on the joint reduction.  Today, she returns with increased pain in the shoulder itself since returned to work.  On exam she has intact active and passive range of motion of the shoulder has equivalent.  She has excellent strength of supraspinatus and subscapularis but definite weakness of the external rotation of the left shoulder compared with the right.  External rotation strength rated 4/5.  Tenderness over the bicipital groove and she has increased pain in this location with O'Brien's test and resisted supination and bicep flexion.  No tenderness over the acromioclavicular joint.  She is not in respiratory distress on examination today.  She has 5/5 motor strength of bilateral grip strength, finger abduction, pronation/supination, bicep, tricep, deltoid.  No pain with hip range of motion.  Negative Spurling sign.  Negative Lhermitte sign.  Discussed options available to  patient.  Options include living with her symptoms as they are now versus activity modification and changing careers versus sternoclavicular joint reconstruction with further work-up of potential left shoulder pathology.  After discussion of options, patient states that she cannot live with the pain she has now.  Plan to order MRI arthrogram of the left shoulder for further evaluation of rotator cuff pathology versus bicep tendon pathology.  See her back after the MRI to go over the results and discuss surgical options more in depth.  Patient agreed with this plan.  Plan to hold her out of work until we have a more definitive course of action based on the MRI results.  Follow-Up Instructions: No follow-ups on file.   Orders:  Orders Placed This Encounter  Procedures   XR Shoulder Left   Arthrogram   MR Shoulder Left w/ contrast   No orders of the defined types were placed in this encounter.   Imaging: No results found.  PMFS History: Patient Active Problem List   Diagnosis Date Noted   Anterior dislocation of left sternoclavicular joint    Hidradenitis suppurativa 08/02/2021   Aphasia as late effect of cerebrovascular accident 12/18/2019   Porokeratosis 06/10/2019   Plantar flexed metatarsal bone of left foot 06/10/2019   ASCUS of cervix with negative high risk HPV 06/04/2019   Sleep apnea 07/07/2016   Anemia, iron deficiency 06/27/2016   Diabetes mellitus ([place]) 03/09/2016  Acute posthemorrhagic anemia    OB + stool    Benign neoplasm of ascending colon    Sepsis ([place]) 11/09/2015   Fecal occult blood test positive 11/09/2015   Absolute anemia    Cerebral infarction due to embolism of left middle cerebral artery ([place]) 07/16/2015   Antiphospholipid syndrome ([place]) 11/17/2014   Cryptogenic stroke ([place]) 10/08/2014   Essential hypertension 10/08/2014   Obesity 10/08/2014   Vitamin D deficiency 09/08/2014   Anemia, unspecified 09/08/2014   Severe obesity (BMI >= 40) ([place])  08/21/2013   Acquired dyslexia 07/12/2013   Aphasia due to stroke 07/12/2013   Headache 07/12/2013   Depression due to stroke 07/12/2013   History of stroke 01/24/2013   HTN (hypertension) 01/24/2013   Past Medical History:  Diagnosis Date   Blood transfusion without reported diagnosis    transfusion December 2016   Clotting disorder ([place])    left leg   Diabetes mellitus without complication ([place])    Hypertension    Other and unspecified ovarian cysts    Stroke ([place])    Vaginal Pap smear, abnormal     Family History  Problem Relation Age of Onset   Diabetes Mellitus II Mother    Diabetes Mother    Stroke Mother    Transient ischemic attack Father    Stroke Father    Stroke Other    Diabetes Other    Diabetes Sister     Past Surgical History:  Procedure Laterality Date   CLOSED REDUCTION CLAVICULAR Left 09/16/2021   Procedure: left shoulder sternoclavicular joint reduction;  Surgeon: Meredith Pel, MD;  Location: [place];  Service: Orthopedics;  Laterality: Left;   COLONOSCOPY WITH PROPOFOL N/A 11/10/2015   Procedure: COLONOSCOPY WITH PROPOFOL;  Surgeon: Lucilla Lame, MD;  Location: [facility] ENDOSCOPY;  Service: Endoscopy;  Laterality: N/A;   ESOPHAGOGASTRODUODENOSCOPY (EGD) WITH PROPOFOL N/A 11/10/2015   Procedure: ESOPHAGOGASTRODUODENOSCOPY (EGD) WITH PROPOFOL;  Surgeon: Lucilla Lame, MD;  Location: [facility] ENDOSCOPY;  Service: Endoscopy;  Laterality: N/A;   LAPAROSCOPIC GASTRIC SLEEVE RESECTION  02/17/2016   placement   LOOP RECORDER IMPLANT N/A 04/25/2014   Procedure: LOOP RECORDER IMPLANT;  Surgeon: Coralyn Mark, MD;  Location: [place] CATH LAB;  Service: Cardiovascular;  Laterality: N/A;   LOOP RECORDER REMOVAL N/A 02/01/2018   Procedure: LOOP RECORDER REMOVAL;  Surgeon: Deboraha Sprang, MD;  Location: [place] CV LAB;  Service: Cardiovascular;  Laterality: N/A;   OVARIAN CYST REMOVAL     TEE WITHOUT CARDIOVERSION N/A 01/25/2013   Procedure: TRANSESOPHAGEAL ECHOCARDIOGRAM  (TEE);  Surgeon: Birdie Riddle, MD;  Location: [facility] ENDOSCOPY;  Service: Cardiovascular;  Laterality: N/A;   Social History   Occupational History   Not on file  Tobacco Use   Smoking status: Former    Packs/day: 1.00    Years: 10.00    Pack years: 10.00    Types: Cigarettes    Quit date: 07/14/2006    Years since quitting: 15.3   Smokeless tobacco: Never   Tobacco comments:    quit smoking around 2007  Vaping Use   Vaping Use: Never used  Substance and Sexual Activity   Alcohol use: No    Alcohol/week: 0.0 standard drinks   Drug use: No   Sexual activity: Yes

## 2021-11-29 ENCOUNTER — Ambulatory Visit: Payer: Medicare Other | Admitting: Physical Therapy

## 2021-11-29 ENCOUNTER — Encounter: Payer: Self-pay | Admitting: Physical Therapy

## 2021-11-29 DIAGNOSIS — G8929 Other chronic pain: Secondary | ICD-10-CM

## 2021-11-29 DIAGNOSIS — M25512 Pain in left shoulder: Secondary | ICD-10-CM | POA: Diagnosis not present

## 2021-11-29 NOTE — Telephone Encounter (Signed)
faxed
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
## 2021-11-29 NOTE — Telephone Encounter (Signed)
I faxed her work note. I didn't fax her office visit. Does he still have the paper with the #? I am happy to fax the office note as well once it is completed.

## 2021-11-30 ENCOUNTER — Other Ambulatory Visit (HOSPITAL_COMMUNITY): Payer: Self-pay

## 2021-12-01 ENCOUNTER — Other Ambulatory Visit: Payer: Self-pay

## 2021-12-01 ENCOUNTER — Ambulatory Visit (INDEPENDENT_AMBULATORY_CARE_PROVIDER_SITE_OTHER): Payer: Managed Care, Other (non HMO)

## 2021-12-01 ENCOUNTER — Encounter: Payer: Managed Care, Other (non HMO) | Admitting: Physical Therapy

## 2021-12-01 ENCOUNTER — Ambulatory Visit: Payer: Managed Care, Other (non HMO) | Attending: Family Medicine | Admitting: Family Medicine

## 2021-12-01 ENCOUNTER — Encounter: Payer: Self-pay | Admitting: Family Medicine

## 2021-12-01 VITALS — BP 105/69 | HR 60 | Ht 65.0 in | Wt 231.8 lb

## 2021-12-01 DIAGNOSIS — I152 Hypertension secondary to endocrine disorders: Secondary | ICD-10-CM

## 2021-12-01 DIAGNOSIS — I639 Cerebral infarction, unspecified: Secondary | ICD-10-CM | POA: Diagnosis not present

## 2021-12-01 DIAGNOSIS — E1159 Type 2 diabetes mellitus with other circulatory complications: Secondary | ICD-10-CM

## 2021-12-01 DIAGNOSIS — Z8673 Personal history of transient ischemic attack (TIA), and cerebral infarction without residual deficits: Secondary | ICD-10-CM

## 2021-12-01 DIAGNOSIS — Z5181 Encounter for therapeutic drug level monitoring: Secondary | ICD-10-CM

## 2021-12-01 LAB — POCT INR: INR: 4.2 — AB (ref 2.0–3.0)
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
## 2021-12-01 NOTE — Patient Instructions (Signed)
-   skip warfarin tonight, then  - START NEW warfarin dosage of 1/2 pill (2.5 mg) every day except 1 tablets (2.5 mg) on SUNDAYS, TUESDAYS & THURSDAYS - recheck in 2 weeks Stay consistent with greens

## 2021-12-01 NOTE — Progress Notes (Signed)
Discuss medications

## 2021-12-01 NOTE — Patient Instructions (Signed)
Discontinue Hydrochlorothiazide

## 2021-12-03 ENCOUNTER — Telehealth: Payer: Self-pay | Admitting: Family Medicine

## 2021-12-03 NOTE — Telephone Encounter (Signed)
Pt was called and informed of medication that she is to not take. Pt received a updated medication list at her last office visit.

## 2021-12-03 NOTE — Telephone Encounter (Signed)
Copied from [place] [id number]. Topic: General - Inquiry >> Dec 02, 2021  2:53 PM Greggory Keen D wrote: Reason for CRM: Pt called and would like a nurse to call her back and go over all f her medications.  She is getting confused with all the medications that she has now  CB#  [phone]

## 2021-12-06 ENCOUNTER — Ambulatory Visit: Payer: Medicare Other | Admitting: Physical Therapy

## 2021-12-06 ENCOUNTER — Encounter: Payer: Self-pay | Admitting: Physical Therapy

## 2021-12-06 DIAGNOSIS — M25512 Pain in left shoulder: Secondary | ICD-10-CM | POA: Diagnosis not present
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
## 2021-12-08 ENCOUNTER — Ambulatory Visit: Payer: Medicare Other | Admitting: Physical Therapy

## 2021-12-08 ENCOUNTER — Ambulatory Visit: Payer: Managed Care, Other (non HMO) | Admitting: Orthopedic Surgery

## 2021-12-08 ENCOUNTER — Other Ambulatory Visit: Payer: Self-pay

## 2021-12-15 ENCOUNTER — Other Ambulatory Visit: Payer: Self-pay

## 2021-12-15 ENCOUNTER — Ambulatory Visit (INDEPENDENT_AMBULATORY_CARE_PROVIDER_SITE_OTHER): Payer: Managed Care, Other (non HMO)

## 2021-12-15 ENCOUNTER — Ambulatory Visit: Payer: Medicare Other | Admitting: Physical Therapy

## 2021-12-15 DIAGNOSIS — Z5181 Encounter for therapeutic drug level monitoring: Secondary | ICD-10-CM | POA: Diagnosis not present

## 2021-12-15 DIAGNOSIS — I639 Cerebral infarction, unspecified: Secondary | ICD-10-CM | POA: Diagnosis not present

## 2021-12-15 DIAGNOSIS — Z8673 Personal history of transient ischemic attack (TIA), and cerebral infarction without residual deficits: Secondary | ICD-10-CM | POA: Diagnosis not present

## 2021-12-15 LAB — POCT INR: INR: 2.1 (ref 2.0–3.0)

## 2021-12-15 MED ORDER — WARFARIN SODIUM 5 MG PO TABS: ORAL_TABLET | ORAL | 0 refills | Status: DC

## 2021-12-15 NOTE — Patient Instructions (Signed)
-   take 1 tablet tonight, then  - continue warfarin dosage of 1/2 pill (2.5 mg) every day except 1 tablets (2.5 mg) on SUNDAYS, TUESDAYS & THURSDAYS - recheck in 3 weeks

## 2021-12-16 ENCOUNTER — Ambulatory Visit
Admission: RE | Admit: 2021-12-16 | Discharge: 2021-12-16 | Disposition: A | Discharge status: Home / Self Care | Payer: Managed Care, Other (non HMO) | Source: Ambulatory Visit | Attending: Surgical | Admitting: Surgical

## 2021-12-16 DIAGNOSIS — M25512 Pain in left shoulder: Secondary | ICD-10-CM
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
## 2021-12-16 MED ORDER — IOPAMIDOL (ISOVUE-M 200) INJECTION 41%
10.0000 mL | Freq: Once | INTRAMUSCULAR | Status: AC
  Administered 2021-12-16: 16:00:00 10 mL via INTRA_ARTICULAR

## 2021-12-17 ENCOUNTER — Ambulatory Visit: Payer: Medicare Other | Admitting: Physical Therapy

## 2021-12-20 NOTE — Progress Notes (Signed)
Cardiology Office Note  Date:  12/21/2021   ID:  Mary [name], DOB 1966/02/19, MRN [id number]  PCP:  [name], MD   Chief Complaint  Patient presents with   12 month follow up     "Doing well." Medications reviewed by the patient verbally.     HPI:  Mary [name] is a 56 y.o. female   bariatric surgery. History of stroke, she has some difficulty with walking Antiphospholipid syndrome,  on warfarin OSA , not on CPAP Previous loop monitor placed, has been more since removed 2019 2020 with some chest tightness Cardiac CTA performed with normal coronaries Who presents for routine follow-up of her stroke  In follow-up today reports that she is doing very well Denies any TIA or stroke symptoms Denies any tachypalpitations concerning for arrhythmia Remains active, no regular exercise program  Lab work reviewed in detail A1C 5.9 to 6.1 Total chol 129 CR 1.18  EKG personally reviewed by myself on todays visit Normal sinus rhythm rate 63 bpm no significant ST-T wave changes  Other past medical history reviewed  Cardiac CTA reviewed with her, calcium score 0, normal coronaries  Denies having sleep apnea symptoms Ten episodes of 3+ sec pauses noted Unable to afford her CPAP  PMH:   has a past medical history of Blood transfusion without reported diagnosis, Clotting disorder ([place]), Diabetes mellitus without complication ([place]), Hypertension, Other and unspecified ovarian cysts, Stroke ([place]), and Vaginal Pap smear, abnormal.  PSH:    Past Surgical History:  Procedure Laterality Date   CLOSED REDUCTION CLAVICULAR Left 09/16/2021   Procedure: left shoulder sternoclavicular joint reduction;  Surgeon: Meredith Pel, MD;  Location: [place];  Service: Orthopedics;  Laterality: Left;   COLONOSCOPY WITH PROPOFOL N/A 11/10/2015   Procedure: COLONOSCOPY WITH PROPOFOL;  Surgeon: Lucilla Lame, MD;  Location: [facility] ENDOSCOPY;  Service: Endoscopy;  Laterality: N/A;    ESOPHAGOGASTRODUODENOSCOPY (EGD) WITH PROPOFOL N/A 11/10/2015   Procedure: ESOPHAGOGASTRODUODENOSCOPY (EGD) WITH PROPOFOL;  Surgeon: Lucilla Lame, MD;  Location: [facility] ENDOSCOPY;  Service: Endoscopy;  Laterality: N/A;   LAPAROSCOPIC GASTRIC SLEEVE RESECTION  02/17/2016   placement   LOOP RECORDER IMPLANT N/A 04/25/2014   Procedure: LOOP RECORDER IMPLANT;  Surgeon: Coralyn Mark, MD;  Location: [place] CATH LAB;  Service: Cardiovascular;  Laterality: N/A;   LOOP RECORDER REMOVAL N/A 02/01/2018   Procedure: LOOP RECORDER REMOVAL;  Surgeon: Deboraha Sprang, MD;  Location: [place] CV LAB;  Service: Cardiovascular;  Laterality: N/A;   OVARIAN CYST REMOVAL     TEE WITHOUT CARDIOVERSION N/A 01/25/2013   Procedure: TRANSESOPHAGEAL ECHOCARDIOGRAM (TEE);  Surgeon: Birdie Riddle, MD;  [facility]: [facility] ENDOSCOPY;  Service: Cardiovascular;  Laterality: N/A;    Current Outpatient Medications  Medication Sig Dispense Refill   amLODipine (NORVASC) 10 MG tablet Take 1 tablet (10 mg total) by mouth daily. 90 tablet 1   atorvastatin (LIPITOR) 40 MG tablet Take 1 tablet (40 mg total) by mouth daily. 90 tablet 1   Calcipotriene-Betameth Diprop (WYNZORA) 0.005-0.064 % CREA Apply 1 application topically as directed. Apply twice daily for two weeks then decrease once daily for 5 days. Avoid Face, groin and underarms. 60 g 2   fluticasone (FLONASE) 50 MCG/ACT nasal spray Place 2 sprays into both nostrils daily. 16 g 6   HYDROcodone-acetaminophen (NORCO/VICODIN) 5-325 MG tablet Take 1 tablet by mouth every 6 (six) hours as needed for moderate pain. 20 tablet 0   Iron, Ferrous Sulfate, 325 (65 Fe) MG TABS Take 325 mg  by mouth daily. 60 tablet 3   methocarbamol (ROBAXIN) 500 MG tablet Take 2 tablets (1,000 mg total) by mouth 2 (two) times daily as needed for muscle spasms. 10 tablet 0   metoprolol tartrate (LOPRESSOR) 25 MG tablet Take 1 tablet (25 mg total) by mouth 2 (two) times daily. 90 tablet 0   warfarin (COUMADIN) 5 MG  tablet Take 1/2 to 1 tablet every day as directed by the coumadin clinic. 90 tablet 0   warfarin (COUMADIN) 5 MG tablet Take 2.5-5 mg by mouth See admin instructions. Take 2.5mg  by mouth once a day on Monday evening, then 5mg  all other nights of the week (Patient not taking: Reported on 12/01/2021)     No current facility-administered medications for this visit.     Allergies:   Patient has no known allergies.   Social History:  The patient  reports that she quit smoking about 15 years ago. Her smoking use included cigarettes. She has a 10.00 pack-year smoking history. She has never used smokeless tobacco. She reports that she does not drink alcohol and does not use drugs.   Family History:   family history includes Diabetes in her mother, sister, and another family member; Diabetes Mellitus II in her mother; Stroke in her father, mother, and another family member; Transient ischemic attack in her father.    Review of Systems: Review of Systems  Constitutional: Negative.   HENT: Negative.    Respiratory: Negative.    Cardiovascular: Negative.   Gastrointestinal: Negative.   Musculoskeletal: Negative.   Neurological: Negative.   Psychiatric/Behavioral: Negative.    All other systems reviewed and are negative.  PHYSICAL EXAM: VS:  BP 140/70 (BP Location: Left Arm, Patient Position: Sitting, Cuff Size: Normal)    Pulse 63    Ht 5\' 5"  (1.651 m)    Wt 237 lb 6 oz (107.7 kg)    LMP 04/04/2019 (Approximate)    SpO2 98%    BMI 39.50 kg/m  , BMI Body mass index is 39.5 kg/m. Constitutional:  oriented to person, place, and time. No distress.  HENT:  Head: Grossly normal Eyes:  no discharge. No scleral icterus.  Neck: No JVD, no carotid bruits  Cardiovascular: Regular rate and rhythm, no murmurs appreciated Pulmonary/Chest: Clear to auscultation bilaterally, no wheezes or rails Abdominal: Soft.  no distension.  no tenderness.  Musculoskeletal: Normal range of motion Neurological:  normal  muscle tone. Coordination normal. No atrophy Skin: Skin warm and dry Psychiatric: normal affect, pleasant  Recent Labs: 09/06/2021: ALT 20 09/15/2021: Hemoglobin 11.6; Platelets 350 11/08/2021: BUN 16; Creatinine, Ser 1.24; Potassium 3.6; Sodium 142    Lipid Panel Lab Results  Component Value Date   CHOL 129 04/01/2021   HDL 37 (L) 04/01/2021   LDLCALC 70 04/01/2021   TRIG 121 04/01/2021      Wt Readings from Last 3 Encounters:  12/21/21 237 lb 6 oz (107.7 kg)  12/01/21 231 lb 12.8 oz (105.1 kg)  10/18/21 236 lb 12.8 oz (107.4 kg)      ASSESSMENT AND PLAN:  Cryptogenic stroke (HCC) On warfarin for antiphospholipid syndrome Denies any further symptoms Cholesterol well controlled  Essential hypertension - Plan: EKG 12-Lead Blood pressure is well controlled on today's visit. No changes made to the medications.  Antiphospholipid syndrome (HCC) On warfarin as above  Sleep apnea, unspecified type Unable to afford her CPAP Having pauses as recorded on loop monitor Stressed importance of weight loss  Loop monitor explant Explanted over a year ago,  No recurrent symptoms of TIA or stroke   Total encounter time more than 25 minutes  Greater than 50% was spent in counseling and coordination of care with the patient   Orders Placed This Encounter  Procedures   EKG 12-Lead     Signed, Esmond Plants, M.D., Ph.D. 12/21/2021  [place], [place]

## 2021-12-21 ENCOUNTER — Encounter: Payer: Self-pay | Admitting: Cardiovascular Disease

## 2021-12-21 ENCOUNTER — Ambulatory Visit (INDEPENDENT_AMBULATORY_CARE_PROVIDER_SITE_OTHER): Payer: Managed Care, Other (non HMO) | Admitting: Cardiovascular Disease

## 2021-12-21 ENCOUNTER — Other Ambulatory Visit: Payer: Self-pay

## 2021-12-21 VITALS — BP 140/70 | HR 63 | Ht 65.0 in | Wt 237.4 lb

## 2021-12-21 DIAGNOSIS — E1159 Type 2 diabetes mellitus with other circulatory complications: Secondary | ICD-10-CM

## 2021-12-21 DIAGNOSIS — I152 Hypertension secondary to endocrine disorders: Secondary | ICD-10-CM | POA: Diagnosis not present

## 2021-12-21 DIAGNOSIS — I10 Essential (primary) hypertension: Secondary | ICD-10-CM

## 2021-12-21 DIAGNOSIS — D6861 Antiphospholipid syndrome: Secondary | ICD-10-CM | POA: Diagnosis not present

## 2021-12-21 DIAGNOSIS — E785 Hyperlipidemia, unspecified: Secondary | ICD-10-CM | POA: Diagnosis not present

## 2021-12-21 DIAGNOSIS — Z8673 Personal history of transient ischemic attack (TIA), and cerebral infarction without residual deficits: Secondary | ICD-10-CM | POA: Diagnosis not present

## 2021-12-21 DIAGNOSIS — G4730 Sleep apnea, unspecified: Secondary | ICD-10-CM

## 2021-12-21 DIAGNOSIS — I639 Cerebral infarction, unspecified: Secondary | ICD-10-CM | POA: Diagnosis not present

## 2021-12-21 MED ORDER — METOPROLOL TARTRATE 25 MG PO TABS: 25.0000 mg | ORAL_TABLET | Freq: Two times a day (BID) | ORAL | 3 refills | Status: DC

## 2021-12-21 MED ORDER — ATORVASTATIN CALCIUM 40 MG PO TABS: 40.0000 mg | ORAL_TABLET | Freq: Every day | ORAL | 3 refills | Status: DC

## 2021-12-21 MED ORDER — AMLODIPINE BESYLATE 10 MG PO TABS: 10.0000 mg | ORAL_TABLET | Freq: Every day | ORAL | 3 refills | Status: DC

## 2021-12-21 NOTE — Patient Instructions (Addendum)
Medication Instructions:  No changes  If you need a refill on your cardiac medications before your next appointment, please call your pharmacy.   Lab work: No new labs needed  Testing/Procedures: No new testing needed  Follow-Up: At [facility], you and your health needs are our priority.  As part of our continuing mission to provide you with exceptional heart care, we have created designated Provider Care Teams.  These Care Teams include your primary Cardiologist (physician) and Advanced Practice Providers (APPs -  Physician Assistants and Nurse Practitioners) who all work together to provide you with the care you need, when you need it.  You will need a follow up appointment in 12 months  Providers on your designated Care Team:   Christopher Berge, NP Ryan Dunn, PA-C Cadence Furth, PA-C  COVID-19 Vaccine Information can be found at: [email or web address] For questions related to vaccine distribution or appointments, please email [email or web address] or call [phone].   

## 2021-12-23 ENCOUNTER — Ambulatory Visit (INDEPENDENT_AMBULATORY_CARE_PROVIDER_SITE_OTHER): Payer: Managed Care, Other (non HMO) | Admitting: Orthopedic Surgery

## 2021-12-23 ENCOUNTER — Telehealth: Payer: Self-pay

## 2021-12-23 ENCOUNTER — Other Ambulatory Visit: Payer: Self-pay

## 2021-12-23 DIAGNOSIS — M25512 Pain in left shoulder: Secondary | ICD-10-CM

## 2021-12-23 NOTE — Telephone Encounter (Signed)
Pt called into the office stating that he would like a new note stating that she is to return back to work Monday the 9th with no restrictions.    Please advise, her work is requiring a new note she stated that it can be submitted via [employer]

## 2021-12-24 ENCOUNTER — Encounter: Payer: Self-pay | Admitting: Orthopedic Surgery

## 2021-12-24 ENCOUNTER — Telehealth: Payer: Self-pay | Admitting: Orthopedic Surgery
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
## 2021-12-24 NOTE — Telephone Encounter (Signed)
Done

## 2021-12-24 NOTE — Telephone Encounter (Signed)
Yes - thank you

## 2021-12-24 NOTE — Telephone Encounter (Signed)
See other note. This has been done. 

## 2021-12-24 NOTE — Telephone Encounter (Signed)
Pt called stating she had an appt on 12/23/21 and was given a note to return to work but because it had restrictions on it her job won't allow her to return. Pt would like a work note stating she can return full duty with no restrictions and would like to have it emailed. Pt would like to be notified when her note has been updated and sent to her.   [email or web address] [phone]

## 2021-12-28 ENCOUNTER — Other Ambulatory Visit: Payer: Self-pay

## 2021-12-28 ENCOUNTER — Ambulatory Visit (INDEPENDENT_AMBULATORY_CARE_PROVIDER_SITE_OTHER): Payer: Managed Care, Other (non HMO) | Admitting: Gastroenterology

## 2021-12-28 ENCOUNTER — Encounter: Payer: Self-pay | Admitting: Gastroenterology

## 2021-12-28 ENCOUNTER — Telehealth: Payer: Self-pay

## 2021-12-28 VITALS — BP 123/76 | HR 67 | Temp 98.3°F | Ht 65.0 in | Wt 239.0 lb

## 2021-12-28 DIAGNOSIS — D509 Iron deficiency anemia, unspecified: Secondary | ICD-10-CM

## 2021-12-28 MED ORDER — NA SULFATE-K SULFATE-MG SULF 17.5-3.13-1.6 GM/177ML PO SOLN: 354.0000 mL | Freq: Once | ORAL | 0 refills | Status: AC

## 2021-12-28 NOTE — Telephone Encounter (Signed)
° °  [facility] Pre-operative Risk Assessment    Request for surgical clearance:  What type of surgery is being performed? Colonoscopy and EGD     When is this surgery scheduled? 01/28/22   Are there any medications that need to be held prior to surgery and how long?Warfarin 94m    Practice name and name of physician performing surgery? [facility]  Dr. VMarius Ditch    What is your office phone and fax number? [phone]  Anesthesia type (None, local, MAC, general) ? General    AShelby Mattocks1/09/2022, 1:59 PM  _________________________________________________________________   (provider comments below)

## 2021-12-28 NOTE — Telephone Encounter (Signed)
Patient with diagnosis of antiphospholipid syndrome on warfarin for anticoagulation.    Procedure: Colonoscopy and EGD Date of procedure: 01/28/22  Patient with a hx of stroke  CrCl 86 ml/min Platelet count 350  Per office protocol, patient can hold warfarin for 5 days prior to procedure.   Patient WILL need bridging with Lovenox (enoxaparin) around procedure.  [place] coumadin clinic will organize bridge. I will send to [facility].

## 2021-12-28 NOTE — Progress Notes (Signed)
Cephas Darby, MD [address]  [place]  [place], [place] [address]  Main: [phone]  Fax: [phone]    Gastroenterology Consultation  Referring Provider:     [name], MD Primary Care Physician:  [name], MD Primary Gastroenterologist:  Dr. Cephas Darby Reason for Consultation:     Chronic iron deficiency anemia        HPI:   Mary [name] is a 56 y.o. female referred by Dr. [name], MD  for consultation & management of chronic iron deficiency anemia.  Patient has history of iron deficiency anemia dating back to 10/2015.  Her serum ferritin levels were 6, she also has mild B12 deficiency, she has mild anemia, chronic microcytosis.  No evidence of chronic liver disease. Patient has history of antiphospholipid antibody syndrome on warfarin, history of hypertension, cryptogenic stroke secondary to antiphospholipid antibody syndrome, obesity BMI 39, sleep apnea  Patient denies any GI symptoms.  She takes iron on and off. 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 - Normal stomach. - Normal examined duodenum. - No specimens collected.  - One 8 mm polyp in the ascending colon. Resected and retrieved. MRI-compatible clip was placed. - Non-bleeding internal hemorrhoids.  DIAGNOSIS:  A. COLON POLYP, ASCENDING; HOT SNARE:  - TUBULAR ADENOMA, 0.8 CM.  - NEGATIVE FOR HIGH-GRADE DYSPLASIA AND MALIGNANCY.   Past Medical History:  Diagnosis Date   Blood transfusion without reported diagnosis    transfusion December 2016   Clotting disorder [facility])    left leg   Diabetes mellitus without complication ([place])    Hypertension    Other and  unspecified ovarian cysts    Stroke [facility])    Vaginal Pap smear, abnormal     Past Surgical History:  Procedure Laterality Date   CLOSED REDUCTION CLAVICULAR Left 09/16/2021   Procedure: left shoulder sternoclavicular joint reduction;  Surgeon: Meredith Pel, MD;  Location: [place];  Service: Orthopedics;  Laterality: Left;   COLONOSCOPY WITH PROPOFOL N/A 11/10/2015   Procedure: COLONOSCOPY WITH PROPOFOL;  Surgeon: Lucilla Lame, MD;  Location: [facility] ENDOSCOPY;  Service: Endoscopy;  Laterality: N/A;   ESOPHAGOGASTRODUODENOSCOPY (EGD) WITH PROPOFOL N/A 11/10/2015   Procedure: ESOPHAGOGASTRODUODENOSCOPY (EGD) WITH PROPOFOL;  Surgeon: Lucilla Lame, MD;  Location: [facility] ENDOSCOPY;  Service: Endoscopy;  Laterality: N/A;   LAPAROSCOPIC GASTRIC SLEEVE RESECTION  02/17/2016   placement   LOOP RECORDER IMPLANT N/A 04/25/2014   Procedure: LOOP RECORDER IMPLANT;  Surgeon: Coralyn Mark, MD;  Location: [place] CATH LAB;  Service: Cardiovascular;  Laterality: N/A;   LOOP RECORDER REMOVAL N/A 02/01/2018   Procedure: LOOP RECORDER REMOVAL;  Surgeon: Deboraha Sprang, MD;  Location: [place] CV LAB;  Service: Cardiovascular;  Laterality: N/A;   OVARIAN CYST REMOVAL     TEE WITHOUT CARDIOVERSION N/A 01/25/2013   Procedure: TRANSESOPHAGEAL ECHOCARDIOGRAM (TEE);  Surgeon: Birdie Riddle, MD;  Location: [facility] ENDOSCOPY;  Service: Cardiovascular;  Laterality: N/A;    Current Outpatient Medications:    amLODipine (NORVASC) 10 MG tablet, Take 1 tablet (10 mg total) by mouth daily., Disp: 90 tablet, Rfl: 3   atorvastatin (LIPITOR) 40 MG tablet, Take 1 tablet (40 mg total) by mouth daily., Disp: 90 tablet, Rfl: 3  Calcipotriene-Betameth Diprop (WYNZORA) 0.005-0.064 % CREA, Apply 1 application topically as directed. Apply twice daily for two weeks then decrease once daily for 5 days. Avoid Face, groin and underarms., Disp: 60 g, Rfl: 2   fluticasone (FLONASE) 50 MCG/ACT nasal spray, Place 2 sprays into both nostrils daily.,  Disp: 16 g, Rfl: 6   HYDROcodone-acetaminophen (NORCO/VICODIN) 5-325 MG tablet, Take 1 tablet by mouth every 6 (six) hours as needed for moderate pain., Disp: 20 tablet, Rfl: 0   Iron, Ferrous Sulfate, 325 (65 Fe) MG TABS, Take 325 mg by mouth daily., Disp: 60 tablet, Rfl: 3   methocarbamol (ROBAXIN) 500 MG tablet, Take 2 tablets (1,000 mg total) by mouth 2 (two) times daily as needed for muscle spasms., Disp: 10 tablet, Rfl: 0   metoprolol tartrate (LOPRESSOR) 25 MG tablet, Take 1 tablet (25 mg total) by mouth 2 (two) times daily., Disp: 180 tablet, Rfl: 3   Na Sulfate-K Sulfate-Mg Sulf 17.5-3.13-1.6 GM/177ML SOLN, Take 354 mLs by mouth once for 1 dose., Disp: 354 mL, Rfl: 0   warfarin (COUMADIN) 5 MG tablet, Take 1/2 to 1 tablet every day as directed by the coumadin clinic., Disp: 90 tablet, Rfl: 0  Family History  Problem Relation Age of Onset   Diabetes Mellitus II Mother    Diabetes Mother    Stroke Mother    Transient ischemic attack Father    Stroke Father    Stroke Other    Diabetes Other    Diabetes Sister      Social History   Tobacco Use   Smoking status: Former    Packs/day: 1.00    Years: 10.00    Pack years: 10.00    Types: Cigarettes    Quit date: 07/14/2006    Years since quitting: 15.4   Smokeless tobacco: Never   Tobacco comments:    quit smoking around 2007  Vaping Use   Vaping Use: Never used  Substance Use Topics   Alcohol use: No    Alcohol/week: 0.0 standard drinks   Drug use: No    Allergies as of 12/28/2021   (No Known Allergies)    Review of Systems:    All systems reviewed and negative except where noted in HPI.   Physical Exam:  BP 123/76 (BP Location: Left Arm, Patient Position: Sitting, Cuff Size: Normal)    Pulse 67    Temp 98.3 F (36.8 C) (Oral)    Ht 5\' 5"  (1.651 m)    Wt 239 lb (108.4 kg)    LMP 04/04/2019 (Approximate)    BMI 39.77 kg/m  Patient's last menstrual period was 04/04/2019 (approximate).  General:   Alert,   Well-developed, well-nourished, pleasant and cooperative in NAD Head:  Normocephalic and atraumatic. Eyes:  Sclera clear, no icterus.   Conjunctiva pink. Ears:  Normal auditory acuity. Nose:  No deformity, discharge, or lesions. Mouth:  No deformity or lesions,oropharynx pink & moist. Neck:  Supple; no masses or thyromegaly. Lungs:  Respirations even and unlabored.  Clear throughout to auscultation.   No wheezes, crackles, or rhonchi. No acute distress. Heart:  Regular rate and rhythm; no murmurs, clicks, rubs, or gallops. Abdomen:  Normal bowel sounds. Soft, non-tender and non-distended without masses, hepatosplenomegaly or hernias noted.  No guarding or rebound tenderness.   Rectal: Not performed Msk:  Symmetrical without gross deformities. Good, equal movement & strength bilaterally. Pulses:  Normal pulses noted. Extremities:  No clubbing or edema.  No cyanosis. Neurologic:  Alert and oriented x3;  grossly normal neurologically. Skin:  Intact without significant lesions or rashes. No jaundice. Psych:  Alert and cooperative. Normal mood and affect.  Imaging Studies: Reviewed  Assessment and Plan:   Mary [name] is a 56 y.o. pleasant African-American female with metabolic syndrome, antiphospholipid antibody syndrome, stroke on Coumadin, chronic iron deficiency anemia of unclear etiology  Recommend upper endoscopy and colonoscopy with possible TI evaluation +/- VCE Trial of fusion plus, samples provided Will obtain clearance from Dr. Donivan Scull office for interruption of warfarin during periprocedural period   I have discussed alternative options, risks & benefits,  which include, but are not limited to, bleeding, infection, perforation,respiratory complication & drug reaction.  The patient agrees with this plan & written consent will be obtained.     Follow up in 4 months   Cephas Darby, MD

## 2021-12-30 ENCOUNTER — Ambulatory Visit: Payer: Managed Care, Other (non HMO) | Admitting: Physical Therapy

## 2021-12-30 NOTE — Telephone Encounter (Signed)
Called and left a message for call back and sent a mychart message with instructions about stopping the Warfarin

## 2021-12-30 NOTE — Telephone Encounter (Addendum)
° °  Name: Mary [name]  DOB: [birth year]  MRN: [id number]   Primary Cardiologist: Ida Rogue, MD  Chart reviewed as part of pre-operative protocol coverage. Patient was contacted 12/30/2021 in reference to pre-operative risk assessment for pending surgery as outlined below.  CHANLEY MCENERY was last seen on 12/21/2021 by Dr. Rockey Situ.  Since that day, AIMEE HELDMAN has done well without exertional chest pain or worsening dyspnea.  Previous coronary CTA obtained in Oct 2020 showed normal coronary arteries  Therefore, based on ACC/AHA guidelines, the patient would be at acceptable risk for the planned procedure without further cardiovascular testing.   See below at recommendation by our clinical pharmacist, patient will need Lovenox bridging.  She has upcoming Coumadin clinic visit to discuss Lovenox bridging during perioperative period.   The patient was advised that if she develops new symptoms prior to surgery to contact our office to arrange for a follow-up visit, and she verbalized understanding.  I will route this recommendation to the requesting party via Epic fax function and remove from pre-op pool. Please call with questions.  T[place], [place] 12/30/2021, 11:41 AM

## 2021-12-31 ENCOUNTER — Encounter: Payer: Self-pay | Admitting: Physical Therapy

## 2021-12-31 ENCOUNTER — Ambulatory Visit: Payer: Managed Care, Other (non HMO) | Attending: Orthopedic Surgery | Admitting: Physical Therapy

## 2021-12-31 ENCOUNTER — Other Ambulatory Visit: Payer: Self-pay

## 2021-12-31 DIAGNOSIS — M25512 Pain in left shoulder: Secondary | ICD-10-CM | POA: Diagnosis not present

## 2021-12-31 DIAGNOSIS — G8929 Other chronic pain: Secondary | ICD-10-CM | POA: Diagnosis not present
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
## 2021-12-31 NOTE — Addendum Note (Signed)
Addended by: Kelton Pillar on: 12/31/2021 08:45 AM   Modules accepted: Orders

## 2022-01-05 ENCOUNTER — Ambulatory Visit (INDEPENDENT_AMBULATORY_CARE_PROVIDER_SITE_OTHER): Payer: Managed Care, Other (non HMO)

## 2022-01-05 ENCOUNTER — Other Ambulatory Visit: Payer: Self-pay

## 2022-01-05 DIAGNOSIS — I639 Cerebral infarction, unspecified: Secondary | ICD-10-CM | POA: Diagnosis not present

## 2022-01-05 DIAGNOSIS — Z8673 Personal history of transient ischemic attack (TIA), and cerebral infarction without residual deficits: Secondary | ICD-10-CM | POA: Diagnosis not present

## 2022-01-05 DIAGNOSIS — Z5181 Encounter for therapeutic drug level monitoring: Secondary | ICD-10-CM | POA: Diagnosis not present

## 2022-01-05 LAB — POCT INR: INR: 1.5 — AB (ref 2.0–3.0)

## 2022-01-05 MED ORDER — ENOXAPARIN SODIUM 150 MG/ML IJ SOSY: 150.0000 mg | PREFILLED_SYRINGE | INTRAMUSCULAR | 0 refills | Status: DC

## 2022-01-05 NOTE — Patient Instructions (Addendum)
-   take extra 1/2 tablet tonight and tomorrow, then - resume warfarin dosage of 1/2 pill (2.5 mg) every day except 1 tablets (2.5 mg) on SUNDAYS, TUESDAYS & THURSDAYS - recheck on 2/15 after your procedure   Saturday, Feb 4: Last dose of warfarin.  Sunday, Feb 5: No warfarin or enoxaparin (Lovenox).  Monday, Feb 6: Inject enoxaparin 150 mg in the fatty abdominal tissue at least 2 inches from the belly button twice a day about 24 hours apart, 8am rotate sites. No warfarin.  Tuesday, Feb 7: Inject enoxaparin in the fatty tissue every 24 hours, 8am . No warfarin.  Wednesday, Feb 8: Inject enoxaparin in the fatty tissue every 24 hours, 8am . No warfarin.  Thursday, Feb 9: Inject enoxaparin in the fatty tissue in the morning at 8 am. No warfarin.  Friday, Feb 10: Procedure Day - No enoxaparin - Resume warfarin in the evening or as directed by doctor (take an extra half tablet with usual dose for 2 days then resume normal dose).  Saturday, Feb 11: Resume enoxaparin inject in the fatty tissue every 24 hours and take warfarin w/ extra 1/2 dose  Sunday, Feb 12: Inject enoxaparin in the fatty tissue every 24 hours and take warfarin  Monday, Feb 13: Inject enoxaparin in the fatty tissue every 12 hours and take warfarin  Tuesday, Feb 14: Inject enoxaparin in the fatty tissue every 24 hours and take warfarin  Wednesday, Feb 15: Inject enoxaparin in the fatty tissue every 24 hours and take warfarin; appt to check INR.

## 2022-01-06 ENCOUNTER — Ambulatory Visit: Payer: Managed Care, Other (non HMO) | Admitting: Physical Therapy

## 2022-01-20 ENCOUNTER — Other Ambulatory Visit: Payer: Self-pay

## 2022-01-20 ENCOUNTER — Ambulatory Visit (INDEPENDENT_AMBULATORY_CARE_PROVIDER_SITE_OTHER): Payer: Managed Care, Other (non HMO) | Admitting: Orthopedic Surgery

## 2022-01-20 DIAGNOSIS — M25512 Pain in left shoulder: Secondary | ICD-10-CM | POA: Diagnosis not present

## 2022-01-21 ENCOUNTER — Encounter: Payer: Self-pay | Admitting: Orthopedic Surgery
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
## 2022-01-26 ENCOUNTER — Telehealth: Payer: Self-pay | Admitting: Cardiovascular Disease

## 2022-01-26 NOTE — Telephone Encounter (Signed)
Spoke w/ pt.   She reports that she missed a Lovenox injection on Monday and wants to know if she should make up for it today. Advised her not to, as those are short acting and there is no need to at this point.  She reports that she picked up the prep for her colonoscopy, but does not know when to start drinking it.  Advised her that she should have received written instructions from GI about her prep.  She states that they are going to call her the day before her procedure to give her the time of her arrival and procedure, so she will double check w/ them at that time.  She is appreciative of the call.

## 2022-01-26 NOTE — Telephone Encounter (Signed)
Patient calling  States she missed a dose Monday and wants to go back over information for upcoming colonoscopy Wants to make sure she is following instructions correctly Please call to clarify

## 2022-01-28 ENCOUNTER — Ambulatory Visit
Admission: RE | Admit: 2022-01-28 | Discharge: 2022-01-28 | Disposition: A | Discharge status: Home / Self Care | Payer: Medicare Other | Attending: Gastroenterology | Admitting: Gastroenterology

## 2022-01-28 ENCOUNTER — Ambulatory Visit: Payer: Medicare Other | Admitting: Anesthesiology

## 2022-01-28 ENCOUNTER — Encounter
Admission: RE | Disposition: A | Discharge status: Home / Self Care | Payer: Self-pay | Source: Home / Self Care | Attending: Gastroenterology

## 2022-01-28 DIAGNOSIS — I10 Essential (primary) hypertension: Secondary | ICD-10-CM | POA: Insufficient documentation

## 2022-01-28 DIAGNOSIS — D123 Benign neoplasm of transverse colon: Secondary | ICD-10-CM | POA: Diagnosis not present

## 2022-01-28 DIAGNOSIS — K644 Residual hemorrhoidal skin tags: Secondary | ICD-10-CM | POA: Diagnosis not present

## 2022-01-28 DIAGNOSIS — Z9884 Bariatric surgery status: Secondary | ICD-10-CM | POA: Insufficient documentation

## 2022-01-28 DIAGNOSIS — D509 Iron deficiency anemia, unspecified: Secondary | ICD-10-CM | POA: Insufficient documentation

## 2022-01-28 DIAGNOSIS — K319 Disease of stomach and duodenum, unspecified: Secondary | ICD-10-CM | POA: Diagnosis not present

## 2022-01-28 DIAGNOSIS — K2950 Unspecified chronic gastritis without bleeding: Secondary | ICD-10-CM | POA: Diagnosis not present

## 2022-01-28 DIAGNOSIS — K649 Unspecified hemorrhoids: Secondary | ICD-10-CM | POA: Diagnosis not present

## 2022-01-28 DIAGNOSIS — E119 Type 2 diabetes mellitus without complications: Secondary | ICD-10-CM | POA: Diagnosis not present

## 2022-01-28 DIAGNOSIS — Z87891 Personal history of nicotine dependence: Secondary | ICD-10-CM | POA: Insufficient documentation

## 2022-01-28 DIAGNOSIS — G4730 Sleep apnea, unspecified: Secondary | ICD-10-CM | POA: Diagnosis not present

## 2022-01-28 DIAGNOSIS — D122 Benign neoplasm of ascending colon: Secondary | ICD-10-CM | POA: Insufficient documentation

## 2022-01-28 DIAGNOSIS — Z8673 Personal history of transient ischemic attack (TIA), and cerebral infarction without residual deficits: Secondary | ICD-10-CM | POA: Insufficient documentation

## 2022-01-28 DIAGNOSIS — K635 Polyp of colon: Secondary | ICD-10-CM | POA: Diagnosis not present

## 2022-01-28 DIAGNOSIS — K3189 Other diseases of stomach and duodenum: Secondary | ICD-10-CM | POA: Diagnosis not present

## 2022-01-28 DIAGNOSIS — E669 Obesity, unspecified: Secondary | ICD-10-CM | POA: Insufficient documentation

## 2022-01-28 DIAGNOSIS — Z6838 Body mass index (BMI) 38.0-38.9, adult: Secondary | ICD-10-CM | POA: Insufficient documentation

## 2022-01-28 HISTORY — PX: COLONOSCOPY WITH PROPOFOL: SHX5780

## 2022-01-28 HISTORY — PX: ESOPHAGOGASTRODUODENOSCOPY (EGD) WITH PROPOFOL: SHX5813

## 2022-01-28 LAB — GLUCOSE, CAPILLARY: Glucose-Capillary: 78 mg/dL (ref 70–99)

## 2022-01-28 SURGERY — COLONOSCOPY WITH PROPOFOL
Anesthesia: General

## 2022-01-28 MED ORDER — SODIUM CHLORIDE 0.9 % IV SOLN
INTRAVENOUS | Status: DC
  Administered 2022-01-28: 20 mL/h via INTRAVENOUS

## 2022-01-28 MED ORDER — LIDOCAINE HCL (PF) 2 % IJ SOLN
INTRAMUSCULAR | Status: AC
  Filled 2022-01-28: qty 5

## 2022-01-28 MED ORDER — PROPOFOL 500 MG/50ML IV EMUL
INTRAVENOUS | Status: AC
  Filled 2022-01-28: qty 50

## 2022-01-28 MED ORDER — PROPOFOL 10 MG/ML IV BOLUS
INTRAVENOUS | Status: DC | PRN
  Administered 2022-01-28: 90 mg via INTRAVENOUS

## 2022-01-28 MED ORDER — PROPOFOL 500 MG/50ML IV EMUL
INTRAVENOUS | Status: DC | PRN
  Administered 2022-01-28: 175 ug/kg/min via INTRAVENOUS

## 2022-01-28 MED ORDER — DEXMEDETOMIDINE (PRECEDEX) IN NS 20 MCG/5ML (4 MCG/ML) IV SYRINGE
PREFILLED_SYRINGE | INTRAVENOUS | Status: DC | PRN
  Administered 2022-01-28: 12 ug via INTRAVENOUS

## 2022-01-28 NOTE — Transfer of Care (Signed)
Immediate Anesthesia Transfer of Care Note  Patient: Mary [name]  Procedure(s) Performed: Procedure(s): COLONOSCOPY WITH PROPOFOL (N/A) ESOPHAGOGASTRODUODENOSCOPY (EGD) WITH PROPOFOL (N/A)  Patient Location: PACU and Endoscopy Unit  Anesthesia Type:General  Level of Consciousness: sedated  Airway & Oxygen Therapy: Patient Spontanous Breathing and Patient connected to nasal cannula oxygen  Post-op Assessment: Report given to RN and Post -op Vital signs reviewed and stable  Post vital signs: Reviewed and stable  Last Vitals:  Vitals:   01/28/22 0901  BP: 107/82  Pulse: 62  Resp: 20  Temp: (!) 35.8 C  SpO2: 892%    Complications: No apparent anesthesia complications

## 2022-01-28 NOTE — H&P (Signed)
Cephas Darby, MD [address]  [place]  [place], [place] [address]  Main: [phone]  Fax: [phone] Pager: [phone]  Primary Care Physician:  [name], MD Primary Gastroenterologist:  Dr. Cephas Darby  Pre-Procedure History & Physical: HPI:  Mary [name] is a 56 y.o. female is here for an endoscopy and colonoscopy.   Past Medical History:  Diagnosis Date   Blood transfusion without reported diagnosis    transfusion December 2016   Clotting disorder [facility])    left leg   Diabetes mellitus without complication ([place])    Hypertension    Other and unspecified ovarian cysts    Stroke [facility])    Vaginal Pap smear, abnormal     Past Surgical History:  Procedure Laterality Date   CLOSED REDUCTION CLAVICULAR Left 09/16/2021   Procedure: left shoulder sternoclavicular joint reduction;  Surgeon: Meredith Pel, MD;  Location: [facility];  Service: Orthopedics;  Laterality: Left;   COLONOSCOPY WITH PROPOFOL N/A 11/10/2015   Procedure: COLONOSCOPY WITH PROPOFOL;  Surgeon: Lucilla Lame, MD;  Location: [facility] ENDOSCOPY;  Service: Endoscopy;  Laterality: N/A;   ESOPHAGOGASTRODUODENOSCOPY (EGD) WITH PROPOFOL N/A 11/10/2015   Procedure: ESOPHAGOGASTRODUODENOSCOPY (EGD) WITH PROPOFOL;  Surgeon: Lucilla Lame, MD;  Location: [facility] ENDOSCOPY;  Service: Endoscopy;  Laterality: N/A;   LAPAROSCOPIC GASTRIC SLEEVE RESECTION  02/17/2016   placement   LOOP RECORDER IMPLANT N/A 04/25/2014   Procedure: LOOP RECORDER IMPLANT;  Surgeon: Coralyn Mark, MD;  Location: [facility] CATH LAB;  Service: Cardiovascular;  Laterality: N/A;   LOOP RECORDER REMOVAL N/A 02/01/2018   Procedure: LOOP RECORDER REMOVAL;  Surgeon: Deboraha Sprang, MD;  Location: [facility] CV LAB;  Service: Cardiovascular;  Laterality: N/A;   OVARIAN CYST REMOVAL     TEE WITHOUT CARDIOVERSION N/A 01/25/2013   Procedure: TRANSESOPHAGEAL ECHOCARDIOGRAM (TEE);  Surgeon: Birdie Riddle, MD;  Location: [facility];  Service:  Cardiovascular;  Laterality: N/A;    Prior to Admission medications   Medication Sig Start Date End Date Taking? Authorizing Provider  amLODipine (NORVASC) 10 MG tablet Take 1 tablet (10 mg total) by mouth daily. 12/21/21  Yes Gollan, Kathlene November, MD  atorvastatin (LIPITOR) 40 MG tablet Take 1 tablet (40 mg total) by mouth daily. 12/21/21  Yes Minna Merritts, MD  Calcipotriene-Betameth Diprop [facility]) 0.005-0.064 % CREA Apply 1 application topically as directed. Apply twice daily for two weeks then decrease once daily for 5 days. Avoid Face, groin and underarms. 03/30/21  Yes Ralene Bathe, MD  enoxaparin (LOVENOX) 150 MG/ML injection Inject 1 mL (150 mg total) into the skin daily for 11 days. 01/24/22 02/04/22 Yes Gollan, Kathlene November, MD  fluticasone (FLONASE) 50 MCG/ACT nasal spray Place 2 sprays into both nostrils daily. 04/26/18  Yes [place] Donovan, PA-C  HYDROcodone-acetaminophen (NORCO/VICODIN) 5-325 MG tablet Take 1 tablet by mouth every 6 (six) hours as needed for moderate pain. 09/16/21 09/16/22 Yes Magnant, Gerrianne Scale, PA-C  Iron, Ferrous Sulfate, 325 (65 Fe) MG TABS Take 325 mg by mouth daily. 04/12/21  Yes [name], MD  methocarbamol (ROBAXIN) 500 MG tablet Take 2 tablets (1,000 mg total) by mouth 2 (two) times daily as needed for muscle spasms. 09/06/21  Yes Wynona Dove A, DO  metoprolol tartrate (LOPRESSOR) 25 MG tablet Take 1 tablet (25 mg total) by mouth 2 (two) times daily. 12/21/21  Yes Minna Merritts, MD  warfarin (COUMADIN) 5 MG tablet Take 1/2 to 1 tablet every day as directed by the coumadin  clinic. 12/15/21  Yes Minna Merritts, MD    Allergies as of 12/28/2021   (No Known Allergies)    Family History  Problem Relation Age of Onset   Diabetes Mellitus II Mother    Diabetes Mother    Stroke Mother    Transient ischemic attack Father    Stroke Father    Stroke Other    Diabetes Other    Diabetes Sister     Social History   Socioeconomic History   Marital  status: Married    Spouse name: Not on file   Number of children: 2   Years of education: 13   Highest education level: Not on file  Occupational History   Not on file  Tobacco Use   Smoking status: Former    Packs/day: 1.00    Years: 10.00    Pack years: 10.00    Types: Cigarettes    Quit date: 07/14/2006    Years since quitting: 15.5   Smokeless tobacco: Never   Tobacco comments:    quit smoking around 2007  Vaping Use   Vaping Use: Never used  Substance and Sexual Activity   Alcohol use: No    Alcohol/week: 0.0 standard drinks   Drug use: No   Sexual activity: Yes  Other Topics Concern   Not on file  Social History Narrative   ** Merged History Encounter **       Patient is single with two children. Patient is right handed. Patient has 13 yrs of education. Patient drinks 5 sodas daily.   Social Determinants of Health   Financial Resource Strain: Not on file  Food Insecurity: Not on file  Transportation Needs: Not on file  Physical Activity: Not on file  Stress: Not on file  Social Connections: Not on file  Intimate Partner Violence: Not on file    Review of Systems: See HPI, otherwise negative ROS  Physical Exam: BP 107/82    Pulse 62    Temp (!) 96.5 F (35.8 C) (Temporal)    Resp 20    Ht 5\' 5"  (1.651 m)    Wt 104.3 kg    LMP 04/04/2019 (Approximate)    SpO2 100%    BMI 38.27 kg/m  General:   Alert,  pleasant and cooperative in NAD Head:  Normocephalic and atraumatic. Neck:  Supple; no masses or thyromegaly. Lungs:  Clear throughout to auscultation.    Heart:  Regular rate and rhythm. Abdomen:  Soft, nontender and nondistended. Normal bowel sounds, without guarding, and without rebound.   Neurologic:  Alert and  oriented x4;  grossly normal neurologically.  Impression/Plan: AVERYANNA SAX is here for an endoscopy and colonoscopy to be performed for chronic IDA  Risks, benefits, limitations, and alternatives regarding  endoscopy and colonoscopy have  been reviewed with the patient.  Questions have been answered.  All parties agreeable.   Sherri Sear, MD  01/28/2022, 9:23 AM

## 2022-01-28 NOTE — Op Note (Signed)
[facility] Gastroenterology Patient Name: Mary [name] Procedure Date: 01/28/2022 9:27 AM MRN: [id number] Account #: [id number] Date of Birth: 1966/03/22 Admit Type: Outpatient Age: 56 Room: [facility] ENDO ROOM 2 Gender: Female Note Status: Finalized Instrument Name: Altamese Cabal Endoscope [id number] Procedure:             Upper GI endoscopy Indications:           Unexplained iron deficiency anemia Providers:             Lin Landsman MD, MD Medicines:             General Anesthesia Complications:         No immediate complications. Estimated blood loss: None. Procedure:             Pre-Anesthesia Assessment:                        - Prior to the procedure, a History and Physical was                         performed, and patient medications and allergies were                         reviewed. The patient is competent. The risks and                         benefits of the procedure and the sedation options and                         risks were discussed with the patient. All questions                         were answered and informed consent was obtained.                         Patient identification and proposed procedure were                         verified by the physician, the nurse, the                         anesthesiologist, the anesthetist and the technician                         in the pre-procedure area in the procedure room in the                         endoscopy suite. Mental Status Examination: alert and                         oriented. Airway Examination: normal oropharyngeal                         airway and neck mobility. Respiratory Examination:                         clear to auscultation. CV Examination: normal.                         Prophylactic  Antibiotics: The patient does not require                         prophylactic antibiotics. Prior Anticoagulants: The                         patient has taken Coumadin (warfarin), last dose was 5                          days prior to procedure. ASA Grade Assessment: III - A                         patient with severe systemic disease. After reviewing                         the risks and benefits, the patient was deemed in                         satisfactory condition to undergo the procedure. The                         anesthesia plan was to use general anesthesia.                         Immediately prior to administration of medications,                         the patient was re-assessed for adequacy to receive                         sedatives. The heart rate, respiratory rate, oxygen                         saturations, blood pressure, adequacy of pulmonary                         ventilation, and response to care were monitored                         throughout the procedure. The physical status of the                         patient was re-assessed after the procedure.                        After obtaining informed consent, the endoscope was                         passed under direct vision. Throughout the procedure,                         the patient's blood pressure, pulse, and oxygen                         saturations were monitored continuously. The Endoscope                         was introduced through the mouth, and advanced to the  second part of duodenum. The upper GI endoscopy was                         accomplished without difficulty. The patient tolerated                         the procedure well. Findings:      The duodenal bulb, second portion of the duodenum and third portion of       the duodenum were normal.      Evidence of a sleeve gastrectomy was found in the gastric body. This was       characterized by erosion. Biopsies were taken with a cold forceps for       Helicobacter pylori testing.      Esophagogastric landmarks were identified: the gastroesophageal junction       was found at 39 cm from the incisors.      The  gastroesophageal junction and examined esophagus were normal. Impression:            - Normal duodenal bulb, second portion of the duodenum                         and third portion of the duodenum.                        - A sleeve gastrectomy was found, characterized by                         erosion. Biopsied.                        - Esophagogastric landmarks identified.                        - Normal gastroesophageal junction and esophagus. Recommendation:        - Await pathology results.                        - Proceed with colonoscopy as scheduled                        See colonoscopy report Procedure Code(s):     --- Professional ---                        [phone], Esophagogastroduodenoscopy, flexible,                         transoral; with biopsy, single or multiple Diagnosis Code(s):     --- Professional ---                        G99.24, Bariatric surgery status                        D50.9, Iron deficiency anemia, unspecified CPT copyright 2019 American Medical Association. All rights reserved. The codes documented in this report are preliminary and upon coder review may  be revised to meet current compliance requirements. Dr. Ulyess Mort Lin Landsman MD, MD 01/28/2022 9:47:40 AM This report has been signed electronically. Number of Addenda: 0 Note Initiated On: 01/28/2022 9:27 AM Estimated Blood Loss:  Estimated  blood loss: none.      [facility]

## 2022-01-28 NOTE — Anesthesia Postprocedure Evaluation (Signed)
Anesthesia Post Note  Patient: Mary [name]  Procedure(s) Performed: COLONOSCOPY WITH PROPOFOL ESOPHAGOGASTRODUODENOSCOPY (EGD) WITH PROPOFOL  Patient location during evaluation: PACU Anesthesia Type: General Level of consciousness: awake and alert, oriented and patient cooperative Pain management: pain level controlled Vital Signs Assessment: post-procedure vital signs reviewed and stable Respiratory status: spontaneous breathing, nonlabored ventilation and respiratory function stable Cardiovascular status: blood pressure returned to baseline and stable Postop Assessment: adequate PO intake Anesthetic complications: no   No notable events documented.   Last Vitals:  Vitals:   01/28/22 1029 01/28/22 1033  BP: 122/83 120/75  Pulse: 63 61  Resp: 13 16  Temp:    SpO2: 97% 94%    Last Pain:  Vitals:   01/28/22 0901  TempSrc: Temporal  PainSc: 0-No pain                 Darrin Nipper

## 2022-01-28 NOTE — Op Note (Signed)
[facility] Gastroenterology Patient Name: Mary [name] Procedure Date: 01/28/2022 9:27 AM MRN: [id number] Account #: [id number] Date of Birth: 1966/06/01 Admit Type: Outpatient Age: 56 Room: [facility] ENDO ROOM 2 Gender: Female Note Status: Finalized Instrument Name: Colonoscope [id number] Procedure:             Colonoscopy Indications:           Last colonoscopy: November 2016, Unexplained iron                         deficiency anemia Providers:             Lin Landsman MD, MD Medicines:             General Anesthesia Complications:         No immediate complications. Estimated blood loss: None. Procedure:             Pre-Anesthesia Assessment:                        - Prior to the procedure, a History and Physical was                         performed, and patient medications and allergies were                         reviewed. The patient is competent. The risks and                         benefits of the procedure and the sedation options and                         risks were discussed with the patient. All questions                         were answered and informed consent was obtained.                         Patient identification and proposed procedure were                         verified by the physician, the nurse, the                         anesthesiologist, the anesthetist and the technician                         in the pre-procedure area in the procedure room in the                         endoscopy suite. Mental Status Examination: alert and                         oriented. Airway Examination: normal oropharyngeal                         airway and neck mobility. Respiratory Examination:                         clear to auscultation. CV Examination: normal.  Prophylactic Antibiotics: The patient does not require                         prophylactic antibiotics. Prior Anticoagulants: The                         patient  has taken Coumadin (warfarin), last dose was 5                         days prior to procedure. ASA Grade Assessment: III - A                         patient with severe systemic disease. After reviewing                         the risks and benefits, the patient was deemed in                         satisfactory condition to undergo the procedure. The                         anesthesia plan was to use general anesthesia.                         Immediately prior to administration of medications,                         the patient was re-assessed for adequacy to receive                         sedatives. The heart rate, respiratory rate, oxygen                         saturations, blood pressure, adequacy of pulmonary                         ventilation, and response to care were monitored                         throughout the procedure. The physical status of the                         patient was re-assessed after the procedure.                        After obtaining informed consent, the colonoscope was                         passed under direct vision. Throughout the procedure,                         the patient's blood pressure, pulse, and oxygen                         saturations were monitored continuously. The                         Colonoscope was introduced through the anus and  advanced to the the terminal ileum, with                         identification of the appendiceal orifice and IC                         valve. The colonoscopy was performed without                         difficulty. The patient tolerated the procedure well.                         The quality of the bowel preparation was evaluated                         using the BBPS [facility] Bowel Preparation Scale) with                         scores of: Right Colon = 3, Transverse Colon = 3 and                         Left Colon = 3 (entire mucosa seen well with no                          residual staining, small fragments of stool or opaque                         liquid). The total BBPS score equals 9. Findings:      The perianal and digital rectal examinations were normal. Pertinent       negatives include normal sphincter tone and no palpable rectal lesions.      The terminal ileum appeared normal.      A 5 mm polyp was found in the ascending colon. The polyp was sessile.       The polyp was removed with a cold snare. Resection and retrieval were       complete. Estimated blood loss: none.      A 12 mm polyp was found in the transverse colon. The polyp was       semi-pedunculated. The polyp was removed with a hot snare. Resection and       retrieval were complete. To prevent bleeding after the polypectomy, one       hemostatic clip was successfully placed (MR conditional). There was no       bleeding during, or at the end, of the procedure.      Non-bleeding external hemorrhoids were found during retroflexion. The       hemorrhoids were small.      The exam was otherwise without abnormality. Impression:            - The examined portion of the ileum was normal.                        - One 5 mm polyp in the ascending colon, removed with                         a cold snare. Resected and retrieved.                        -  One 12 mm polyp in the transverse colon, removed                         with a hot snare. Resected and retrieved. Clip (MR                         conditional) was placed.                        - Non-bleeding external hemorrhoids.                        - The examination was otherwise normal. Recommendation:        - Discharge patient to home (with escort).                        - Resume previous diet today.                        - Continue present medications.                        - Await pathology results.                        - Repeat colonoscopy in 3 years for surveillance based                         on pathology results.                         - Resume Coumadin (warfarin) in 3 days and Lovenox                         (enoxaparin) tomorrow at prior doses. Refer to                         referring physician for further adjustment of therapy.                        - To visualize the small bowel, perform video capsule                         endoscopy at appointment to be scheduled. Procedure Code(s):     --- Professional ---                        [phone], Colonoscopy, flexible; with removal of                         tumor(s), polyp(s), or other lesion(s) by snare                         technique Diagnosis Code(s):     --- Professional ---                        K64.4, Residual hemorrhoidal skin tags                        K63.5, Polyp of colon  D50.9, Iron deficiency anemia, unspecified CPT copyright 2019 American Medical Association. All rights reserved. The codes documented in this report are preliminary and upon coder review may  be revised to meet current compliance requirements. Dr. Ulyess Mort Lin Landsman MD, MD 01/28/2022 10:10:51 AM This report has been signed electronically. Number of Addenda: 0 Note Initiated On: 01/28/2022 9:27 AM Scope Withdrawal Time: 0 hours 15 minutes 49 seconds  Total Procedure Duration: 0 hours 18 minutes 35 seconds  Estimated Blood Loss:  Estimated blood loss: none.      [facility]

## 2022-01-28 NOTE — Anesthesia Preprocedure Evaluation (Addendum)
Anesthesia Evaluation  Patient identified by MRN, date of birth, ID band Patient awake    Reviewed: Allergy & Precautions, NPO status , Patient's Chart, lab work & pertinent test results  History of Anesthesia Complications Negative for: history of anesthetic complications  Airway Mallampati: III   Neck ROM: Full    Dental   Missing molars, few molars chipped:   Pulmonary sleep apnea , former smoker (quit 2007),    Pulmonary exam normal breath sounds clear to auscultation       Cardiovascular hypertension, Normal cardiovascular exam Rhythm:Regular Rate:Normal  ECG 12/21/21:  Normal sinus rhythm rate 63 bpm no significant ST-T wave changes   Neuro/Psych  Headaches, PSYCHIATRIC DISORDERS Depression CVA    GI/Hepatic negative GI ROS,   Endo/Other  diabetes, Type 2Obesity   Renal/GU negative Renal ROS     Musculoskeletal   Abdominal   Peds  Hematology  (+) Blood dyscrasia (anti-Pl antibody syndrome, on warfarin, currently on lovenox bridge), anemia ,   Anesthesia Other Findings Cardiology note 12/21/21:  Cryptogenic stroke ([place]) On warfarin for antiphospholipid syndrome Denies any further symptoms Cholesterol well controlled  Essential hypertension - Plan: EKG 12-Lead Blood pressure is well controlled on today's visit. No changes made to the medications.  Antiphospholipid syndrome ([facility]) On warfarin as above  Sleep apnea, unspecified type Unable to afford her CPAP Having pauses as recorded on loop monitor Stressed importance of weight loss  Loop monitor explant Explanted over a year ago, No recurrent symptoms of TIA or stroke  Reproductive/Obstetrics                            Anesthesia Physical Anesthesia Plan  ASA: 3  Anesthesia Plan: General   Post-op Pain Management:    Induction: Intravenous  PONV Risk Score and Plan: 3 and Propofol infusion, TIVA and Treatment may  vary due to age or medical condition  Airway Management Planned: Natural Airway  Additional Equipment:   Intra-op Plan:   Post-operative Plan:   Informed Consent: I have reviewed the patients History and Physical, chart, labs and discussed the procedure including the risks, benefits and alternatives for the proposed anesthesia with the patient or authorized representative who has indicated his/her understanding and acceptance.       Plan Discussed with: CRNA  Anesthesia Plan Comments: (LMA/GETA backup discussed.  Patient consented for risks of anesthesia including but not limited to:  - adverse reactions to medications - damage to eyes, teeth, lips or other oral mucosa - nerve damage due to positioning  - sore throat or hoarseness - damage to heart, brain, nerves, lungs, other parts of body or loss of life  Informed patient about role of CRNA in peri- and intra-operative care.  Patient voiced understanding.)        Anesthesia Quick Evaluation

## 2022-01-31 ENCOUNTER — Encounter: Payer: Self-pay | Admitting: Gastroenterology

## 2022-01-31 LAB — SURGICAL PATHOLOGY

## 2022-02-01 ENCOUNTER — Telehealth: Payer: Self-pay

## 2022-02-01 ENCOUNTER — Encounter: Payer: Self-pay | Admitting: Gastroenterology

## 2022-02-01 NOTE — Telephone Encounter (Signed)
Called patient and patient verbalized understanding of results  

## 2022-02-01 NOTE — Telephone Encounter (Signed)
-----   Message from Lin Landsman, MD sent at 02/01/2022  2:49 PM EST ----- Patient will need video capsule endoscopy to evaluate for unexplained iron deficiency anemia The pathology results from upper endoscopy came back normal.  The polyps removed from her colon were benign.  She will need surveillance colonoscopy in 3 years.  [employer]

## 2022-02-02 ENCOUNTER — Ambulatory Visit (INDEPENDENT_AMBULATORY_CARE_PROVIDER_SITE_OTHER): Payer: Managed Care, Other (non HMO)

## 2022-02-02 ENCOUNTER — Other Ambulatory Visit: Payer: Self-pay

## 2022-02-02 DIAGNOSIS — I639 Cerebral infarction, unspecified: Secondary | ICD-10-CM

## 2022-02-02 DIAGNOSIS — Z8673 Personal history of transient ischemic attack (TIA), and cerebral infarction without residual deficits: Secondary | ICD-10-CM

## 2022-02-02 DIAGNOSIS — Z5181 Encounter for therapeutic drug level monitoring: Secondary | ICD-10-CM | POA: Diagnosis not present

## 2022-02-02 LAB — POCT INR: INR: 1.2 — AB (ref 2.0–3.0)

## 2022-02-02 NOTE — Patient Instructions (Signed)
-   take 1.5 tablets tonight and tomorrow, - stay on Lovenox thru Friday, on Saturday STOP LOVENOX -  on Friday resume warfarin dosage of 1/2 pill (2.5 mg) every day except 1 tablets (2.5 mg) on SUNDAYS, TUESDAYS & THURSDAYS - recheck INR in 3 weeks

## 2022-02-21 DIAGNOSIS — B349 Viral infection, unspecified: Secondary | ICD-10-CM | POA: Diagnosis not present

## 2022-02-21 DIAGNOSIS — R197 Diarrhea, unspecified: Secondary | ICD-10-CM | POA: Diagnosis not present

## 2022-02-23 ENCOUNTER — Ambulatory Visit (INDEPENDENT_AMBULATORY_CARE_PROVIDER_SITE_OTHER): Payer: Medicare Other

## 2022-02-23 ENCOUNTER — Other Ambulatory Visit: Payer: Self-pay

## 2022-02-23 DIAGNOSIS — Z5181 Encounter for therapeutic drug level monitoring: Secondary | ICD-10-CM

## 2022-02-23 DIAGNOSIS — I639 Cerebral infarction, unspecified: Secondary | ICD-10-CM

## 2022-02-23 DIAGNOSIS — Z8673 Personal history of transient ischemic attack (TIA), and cerebral infarction without residual deficits: Secondary | ICD-10-CM

## 2022-02-23 LAB — POCT INR: INR: 3.7 — AB (ref 2.0–3.0)

## 2022-02-23 NOTE — Patient Instructions (Signed)
-   skip warfarin tonight, then ?- continue warfarin dosage of 1/2 pill (2.5 mg) every day except 1 tablets (2.5 mg) on SUNDAYS, TUESDAYS & THURSDAYS ?- recheck INR in 4 weeks ?

## 2022-03-03 ENCOUNTER — Other Ambulatory Visit: Payer: Self-pay

## 2022-03-03 MED ORDER — WYNZORA 0.005-0.064 % EX CREA: 1.0000 "application " | TOPICAL_CREAM | CUTANEOUS | 0 refills | Status: AC

## 2022-03-03 NOTE — Progress Notes (Signed)
Patient called asking for RF of Wynzora cream. aw ?

## 2022-04-06 ENCOUNTER — Ambulatory Visit (INDEPENDENT_AMBULATORY_CARE_PROVIDER_SITE_OTHER): Payer: Medicare Other

## 2022-04-06 DIAGNOSIS — Z8673 Personal history of transient ischemic attack (TIA), and cerebral infarction without residual deficits: Secondary | ICD-10-CM | POA: Diagnosis not present

## 2022-04-06 DIAGNOSIS — Z5181 Encounter for therapeutic drug level monitoring: Secondary | ICD-10-CM | POA: Diagnosis not present

## 2022-04-06 DIAGNOSIS — I639 Cerebral infarction, unspecified: Secondary | ICD-10-CM | POA: Diagnosis not present

## 2022-04-06 LAB — POCT INR: INR: 2.1 (ref 2.0–3.0)

## 2022-04-06 NOTE — Patient Instructions (Signed)
-   TAKE 1 TABLET TONIGHT ONLY ?- continue warfarin dosage of 1/2 pill (2.5 mg) every day except 1 tablets (2.5 mg) on SUNDAYS, TUESDAYS & THURSDAYS ?- recheck INR in 4 weeks ?

## 2022-04-27 ENCOUNTER — Encounter: Payer: Self-pay | Admitting: Gastroenterology

## 2022-04-27 ENCOUNTER — Other Ambulatory Visit: Payer: Self-pay

## 2022-04-27 ENCOUNTER — Ambulatory Visit (INDEPENDENT_AMBULATORY_CARE_PROVIDER_SITE_OTHER): Payer: Medicare Other | Admitting: Gastroenterology

## 2022-04-27 VITALS — BP 143/86 | HR 61 | Temp 97.9°F | Ht 65.0 in | Wt 237.1 lb

## 2022-04-27 DIAGNOSIS — D509 Iron deficiency anemia, unspecified: Secondary | ICD-10-CM | POA: Diagnosis not present

## 2022-04-27 NOTE — Progress Notes (Signed)
?  ?Cephas Darby, MD ?[address]  ?[address]  ?[place], [place] [address]  ?Main: [phone]  ?Fax: [phone] ? ? ? ?Gastroenterology Consultation ? ?Referring Provider:     [name], MD ?Primary Care Physician:  [name], MD ?Primary Gastroenterologist:  Dr. Cephas Darby ?Reason for Consultation:     Chronic iron deficiency anemia ?      ? HPI:   ?Mary [name] is a 56 y.o. female referred by Dr. [name], MD  for consultation & management of chronic iron deficiency anemia.  Patient has history of iron deficiency anemia dating back to 10/2015.  Her serum ferritin levels were 6, she also has mild B12 deficiency, she has mild anemia, chronic microcytosis.  No evidence of chronic liver disease. ?Patient has history of antiphospholipid antibody syndrome on warfarin, history of hypertension, cryptogenic stroke secondary to antiphospholipid antibody syndrome, obesity BMI 39, sleep apnea ? ?Follow-up visit 04/27/2022 ?Patient underwent upper endoscopy as well as colonoscopy which were unremarkable.  Patient has history of sleeve gastrectomy.  Patient is not currently on iron, she has craving for ice.  She used to experience intermittent bright red blood per rectum secondary to hemorrhoids, she has been taking stool softener to keep her bowels regular and is no longer experiencing rectal bleeding ? ?Patient denies any GI symptoms.  She takes iron on and off. 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 ?NSAIDs: None ? ?Antiplts/Anticoagulants/Anti thrombotics: Warfarin for history of antiphospholipid antibody syndrome and stroke ? ?Patient reports that her father might have had stomach cancer ? ?GI Procedures:  ?EGD and colonoscopy 01/28/2022 ?- Normal duodenal bulb, second portion of the duodenum and third portion of the duodenum. ?- A sleeve gastrectomy was found, characterized by erosion. Biopsied. ?- Esophagogastric landmarks identified. ?-  Normal gastroesophageal junction and esophagus. ? ?- The examined portion of the ileum was normal. ?- One 5 mm polyp in the ascending colon, removed with a cold snare. Resected and retrieved. ?- One 12 mm polyp in the transverse colon, removed with a hot snare. Resected and retrieved. Clip (MR ?conditional) was placed. ?- Non-bleeding external hemorrhoids. ?- The examination was otherwise normal. ?DIAGNOSIS:  ?A.  STOMACH ERYTHEMA; COLD BIOPSY:  ?- ANTRAL AND OXYNTIC MUCOSA WITH MILD REACTIVE GASTROPATHY.  ?- NEGATIVE FOR H. PYLORI, INTESTINAL METAPLASIA, DYSPLASIA, AND  ?MALIGNANCY.  ? ?B.  COLON POLYP, ASCENDING; COLD SNARE:  ?- TUBULAR ADENOMA.  ?- NEGATIVE FOR HIGH-GRADE DYSPLASIA AND MALIGNANCY.  ? ?C.  COLON POLYP, TRANSVERSE; HOT SNARE:  ?- TUBULAR ADENOMA, MULTIPLE FRAGMENTS.  ?- NEGATIVE FOR HIGH-GRADE DYSPLASIA AND MALIGNANCY. ? ?EGD and colonoscopy 11/10/2015 ?- Normal esophagus. ?- Normal stomach. ?- Normal examined duodenum. ?- No specimens collected. ? ?- One 8 mm polyp in the ascending colon. Resected and retrieved. MRI-compatible clip was placed. ?- Non-bleeding internal hemorrhoids. ? ?DIAGNOSIS:  ?A. COLON POLYP, ASCENDING; HOT SNARE:  ?- TUBULAR ADENOMA, 0.8 CM.  ?- NEGATIVE FOR HIGH-GRADE DYSPLASIA AND MALIGNANCY.  ? ?Past Medical History:  ?Diagnosis Date  ? Blood transfusion without reported diagnosis   ? transfusion December 2016  ? Clotting disorder ([place])   ? left leg  ? Diabetes mellitus without complication ([place])   ? Hypertension   ? Other and unspecified ovarian cysts   ? Stroke [facility])   ? Vaginal Pap smear, abnormal   ? ? ?Past Surgical History:  ?Procedure Laterality Date  ? CLOSED REDUCTION CLAVICULAR Left 09/16/2021  ?  Procedure: left shoulder sternoclavicular joint reduction;  Surgeon: Meredith Pel, MD;  Location: [place];  Service: Orthopedics;  Laterality: Left;  ? COLONOSCOPY WITH PROPOFOL N/A 11/10/2015  ? Procedure: COLONOSCOPY WITH PROPOFOL;  Surgeon: Lucilla Lame, MD;   Location: [facility] ENDOSCOPY;  Service: Endoscopy;  Laterality: N/A;  ? COLONOSCOPY WITH PROPOFOL N/A 01/28/2022  ? Procedure: COLONOSCOPY WITH PROPOFOL;  Surgeon: Lin Landsman, MD;  Location: [facility] ENDOSCOPY;  Service: Gastroenterology;  Laterality: N/A;  ? ESOPHAGOGASTRODUODENOSCOPY (EGD) WITH PROPOFOL N/A 11/10/2015  ? Procedure: ESOPHAGOGASTRODUODENOSCOPY (EGD) WITH PROPOFOL;  Surgeon: Lucilla Lame, MD;  Location: [facility] ENDOSCOPY;  Service: Endoscopy;  Laterality: N/A;  ? ESOPHAGOGASTRODUODENOSCOPY (EGD) WITH PROPOFOL N/A 01/28/2022  ? Procedure: ESOPHAGOGASTRODUODENOSCOPY (EGD) WITH PROPOFOL;  Surgeon: Lin Landsman, MD;  Location: [facility] ENDOSCOPY;  Service: Gastroenterology;  Laterality: N/A;  ? LAPAROSCOPIC GASTRIC SLEEVE RESECTION  02/17/2016  ? placement  ? LOOP RECORDER IMPLANT N/A 04/25/2014  ? Procedure: LOOP RECORDER IMPLANT;  Surgeon: Coralyn Mark, MD;  Location: [place] CATH LAB;  Service: Cardiovascular;  Laterality: N/A;  ? LOOP RECORDER REMOVAL N/A 02/01/2018  ? Procedure: LOOP RECORDER REMOVAL;  Surgeon: Deboraha Sprang, MD;  Location: [place] CV LAB;  Service: Cardiovascular;  Laterality: N/A;  ? OVARIAN CYST REMOVAL    ? TEE WITHOUT CARDIOVERSION N/A 01/25/2013  ? Procedure: TRANSESOPHAGEAL ECHOCARDIOGRAM (TEE);  Surgeon: Birdie Riddle, MD;  Location: [place];  Service: Cardiovascular;  Laterality: N/A;  ? ? ?Current Outpatient Medications:  ?  amLODipine (NORVASC) 10 MG tablet, Take 1 tablet (10 mg total) by mouth daily., Disp: 90 tablet, Rfl: 3 ?  atorvastatin (LIPITOR) 40 MG tablet, Take 1 tablet (40 mg total) by mouth daily., Disp: 90 tablet, Rfl: 3 ?  Calcipotriene-Betameth Diprop (WYNZORA) 0.005-0.064 % CREA, Apply 1 application. topically as directed. Apply twice daily for two weeks then decrease once daily for 5 days. Avoid Face, groin and underarms., Disp: 60 g, Rfl: 0 ?  fluticasone (FLONASE) 50 MCG/ACT nasal spray, Place 2 sprays into both nostrils daily., Disp: 16 g, Rfl: 6 ?   Iron, Ferrous Sulfate, 325 (65 Fe) MG TABS, Take 325 mg by mouth daily., Disp: 60 tablet, Rfl: 3 ?  metoprolol tartrate (LOPRESSOR) 25 MG tablet, Take 1 tablet (25 mg total) by mouth 2 (two) times daily., Disp: 180 tablet, Rfl: 3 ?  warfarin (COUMADIN) 5 MG tablet, Take 1/2 to 1 tablet every day as directed by the coumadin clinic., Disp: 90 tablet, Rfl: 0 ? ?Family History  ?Problem Relation Age of Onset  ? Diabetes Mellitus II Mother   ? Diabetes Mother   ? Stroke Mother   ? Transient ischemic attack Father   ? Stroke Father   ? Stroke Other   ? Diabetes Other   ? Diabetes Sister   ?  ? ?Social History  ? ?Tobacco Use  ? Smoking status: Former  ?  Packs/day: 1.00  ?  Years: 10.00  ?  Pack years: 10.00  ?  Types: Cigarettes  ?  Quit date: 07/14/2006  ?  Years since quitting: 15.7  ? Smokeless tobacco: Never  ? Tobacco comments:  ?  quit smoking around 2007  ?Vaping Use  ? Vaping Use: Never used  ?Substance Use Topics  ? Alcohol use: No  ?  Alcohol/week: 0.0 standard drinks  ? Drug use: No  ? ? ?Allergies as of 04/27/2022  ? (No Known Allergies)  ? ? ?Review of Systems:    ?All systems reviewed and  negative except where noted in HPI. ? ? Physical Exam:  ?BP (!) 143/86 (BP Location: Left Arm, Patient Position: Sitting, Cuff Size: Normal)   Pulse 61   Temp 97.9 ?F (36.6 ?C) (Oral)   Ht '5\' 5"'$  (1.651 m)   Wt 237 lb 2 oz (107.6 kg)   LMP 04/04/2019 (Approximate)   BMI 39.46 kg/m?  ?Patient's last menstrual period was 04/04/2019 (approximate). ? ?General:   Alert,  Well-developed, well-nourished, pleasant and cooperative in NAD ?Head:  Normocephalic and atraumatic. ?Eyes:  Sclera clear, no icterus.   Conjunctiva pink. ?Ears:  Normal auditory acuity. ?Nose:  No deformity, discharge, or lesions. ?Mouth:  No deformity or lesions,oropharynx pink & moist. ?Neck:  Supple; no masses or thyromegaly. ?Lungs:  Respirations even and unlabored.  Clear throughout to auscultation.   No wheezes, crackles, or rhonchi. No acute  distress. ?Heart:  Regular rate and rhythm; no murmurs, clicks, rubs, or gallops. ?Abdomen:  Normal bowel sounds. Soft, non-tender and non-distended without masses, hepatosplenomegaly or hernias noted.  No guarding o

## 2022-04-27 NOTE — Patient Instructions (Signed)
Gave Fusion Samples  ?

## 2022-04-28 LAB — IRON,TIBC AND FERRITIN PANEL
Ferritin: 11 ng/mL — ABNORMAL LOW (ref 15–150)
Iron Saturation: 12 % — ABNORMAL LOW (ref 15–55)
Iron: 47 ug/dL (ref 27–159)
Total Iron Binding Capacity: 408 ug/dL (ref 250–450)
UIBC: 361 ug/dL (ref 131–425)

## 2022-04-28 LAB — CBC
Hematocrit: 33.2 % — ABNORMAL LOW (ref 34.0–46.6)
Hemoglobin: 10.8 g/dL — ABNORMAL LOW (ref 11.1–15.9)
MCH: 22.5 pg — ABNORMAL LOW (ref 26.6–33.0)
MCHC: 32.5 g/dL (ref 31.5–35.7)
MCV: 69 fL — ABNORMAL LOW (ref 79–97)
Platelets: 254 10*3/uL (ref 150–450)
RBC: 4.81 x10E6/uL (ref 3.77–5.28)
RDW: 17 % — ABNORMAL HIGH (ref 11.7–15.4)
WBC: 4.7 10*3/uL (ref 3.4–10.8)

## 2022-04-28 LAB — B12 AND FOLATE PANEL
Folate: 16.4 ng/mL (ref >3.0)
Vitamin B-12: 408 pg/mL (ref 232–1245)

## 2022-04-29 ENCOUNTER — Telehealth: Payer: Self-pay

## 2022-04-29 DIAGNOSIS — D509 Iron deficiency anemia, unspecified: Secondary | ICD-10-CM

## 2022-04-29 NOTE — Telephone Encounter (Signed)
Patient verbalized understanding and Placed referral to hematology  ?

## 2022-04-29 NOTE — Telephone Encounter (Signed)
-----   Message from Lin Landsman, MD sent at 04/28/2022  5:18 PM EDT ----- ?Please inform patient that she continues to have severe iron deficiency anemia.  Recommend referral to hematology for iron infusions if patient cannot afford fusion plus prescription ? ?Recheck CBC and iron studies in 3 months ? ?RV ?

## 2022-05-04 ENCOUNTER — Ambulatory Visit (INDEPENDENT_AMBULATORY_CARE_PROVIDER_SITE_OTHER): Payer: Medicare Other

## 2022-05-04 DIAGNOSIS — Z5181 Encounter for therapeutic drug level monitoring: Secondary | ICD-10-CM | POA: Diagnosis not present

## 2022-05-04 DIAGNOSIS — I639 Cerebral infarction, unspecified: Secondary | ICD-10-CM | POA: Diagnosis not present

## 2022-05-04 DIAGNOSIS — Z8673 Personal history of transient ischemic attack (TIA), and cerebral infarction without residual deficits: Secondary | ICD-10-CM

## 2022-05-04 LAB — POCT INR: INR: 2.2 (ref 2.0–3.0)

## 2022-05-04 MED ORDER — WARFARIN SODIUM 5 MG PO TABS: ORAL_TABLET | ORAL | 0 refills | Status: DC

## 2022-05-04 NOTE — Patient Instructions (Signed)
-   TAKE 1 TABLET TONIGHT ONLY ?- continue warfarin dosage of 1/2 pill (2.5 mg) every day except 1 tablets (2.5 mg) on SUNDAYS, TUESDAYS & THURSDAYS ?- recheck INR in 4 weeks ?- EAT ONLY 3 HELPINGS OF GREEN PER WEEK ? ?

## 2022-05-05 ENCOUNTER — Other Ambulatory Visit: Payer: Self-pay

## 2022-05-06 ENCOUNTER — Inpatient Hospital Stay: Payer: Medicare Other

## 2022-05-06 ENCOUNTER — Encounter: Payer: Self-pay | Admitting: Oncology

## 2022-05-06 ENCOUNTER — Inpatient Hospital Stay: Payer: Medicare Other | Attending: Oncology | Admitting: Oncology

## 2022-05-06 VITALS — BP 126/82 | HR 64 | Temp 96.9°F | Resp 18 | Ht 65.0 in | Wt 238.8 lb

## 2022-05-06 DIAGNOSIS — E119 Type 2 diabetes mellitus without complications: Secondary | ICD-10-CM | POA: Diagnosis not present

## 2022-05-06 DIAGNOSIS — R5383 Other fatigue: Secondary | ICD-10-CM | POA: Diagnosis not present

## 2022-05-06 DIAGNOSIS — Z87891 Personal history of nicotine dependence: Secondary | ICD-10-CM | POA: Insufficient documentation

## 2022-05-06 DIAGNOSIS — D509 Iron deficiency anemia, unspecified: Secondary | ICD-10-CM | POA: Insufficient documentation

## 2022-05-06 DIAGNOSIS — Z79899 Other long term (current) drug therapy: Secondary | ICD-10-CM | POA: Insufficient documentation

## 2022-05-06 DIAGNOSIS — Z7901 Long term (current) use of anticoagulants: Secondary | ICD-10-CM | POA: Diagnosis not present

## 2022-05-06 DIAGNOSIS — D123 Benign neoplasm of transverse colon: Secondary | ICD-10-CM | POA: Diagnosis not present

## 2022-05-06 DIAGNOSIS — Z9884 Bariatric surgery status: Secondary | ICD-10-CM | POA: Diagnosis not present

## 2022-05-06 DIAGNOSIS — D6861 Antiphospholipid syndrome: Secondary | ICD-10-CM | POA: Diagnosis not present

## 2022-05-06 DIAGNOSIS — Z823 Family history of stroke: Secondary | ICD-10-CM | POA: Insufficient documentation

## 2022-05-06 DIAGNOSIS — Z833 Family history of diabetes mellitus: Secondary | ICD-10-CM | POA: Insufficient documentation

## 2022-05-06 DIAGNOSIS — G4730 Sleep apnea, unspecified: Secondary | ICD-10-CM | POA: Insufficient documentation

## 2022-05-06 DIAGNOSIS — I10 Essential (primary) hypertension: Secondary | ICD-10-CM | POA: Insufficient documentation

## 2022-05-06 DIAGNOSIS — D508 Other iron deficiency anemias: Secondary | ICD-10-CM

## 2022-05-06 DIAGNOSIS — D122 Benign neoplasm of ascending colon: Secondary | ICD-10-CM | POA: Diagnosis not present

## 2022-05-06 DIAGNOSIS — Z6841 Body Mass Index (BMI) 40.0 and over, adult: Secondary | ICD-10-CM | POA: Insufficient documentation

## 2022-05-06 DIAGNOSIS — Z8673 Personal history of transient ischemic attack (TIA), and cerebral infarction without residual deficits: Secondary | ICD-10-CM | POA: Insufficient documentation

## 2022-05-06 DIAGNOSIS — Z5189 Encounter for other specified aftercare: Secondary | ICD-10-CM | POA: Insufficient documentation

## 2022-05-08 NOTE — Progress Notes (Signed)
Hematology/Oncology Consult note [facility] Telephone:([phone] [phone]  Patient Care Team: [name], MD as PCP - General (Family Medicine) Minna Merritts, MD as PCP - Cardiology (Cardiology) Deboraha Sprang, MD as PCP - Electrophysiology (Cardiology) [name], MD (Family Medicine)   Name of the patient: Mary [name]  [id number]  1966-01-15    Reason for referral- iron deficiency anemia   Referring physician-Dr. Sherri Sear  Date of visit: 05/08/22   History of presenting illness- Patient is a 56 year old female who has a prior history of antiphospholipid antibody syndrome on Coumadin and seen by Dr. Marius Ditch for iron deficiency anemia.  She had an EGD and a colonoscopy in February 2023 which did not show any evidence of malignancy.  She also has a prior history of sleeve gastrectomy.  Her most recent CBC from 04/27/2022 showed white count of 4.7, H&H of 10.8/33.2 with an MCV of 69 and a platelet count of 254.  Iron studies showed a low ferritin of 11 and iron saturation of 12%.  B12 and folate were normal.  Patient has had chronic microcytic anemia with a hemoglobin that typically fluctuates between 10-11.  Patient denies any blood loss in her stool or urine.  Denies any dark melanotic stools.  Denies any consistent use of NSAIDs.  Currently reports ongoing fatigue.  ECOG PS- 1  Pain scale- 0   Review of systems- Review of Systems  Constitutional:  Positive for malaise/fatigue. Negative for chills, fever and weight loss.  HENT:  Negative for congestion, ear discharge and nosebleeds.   Eyes:  Negative for blurred vision.  Respiratory:  Negative for cough, hemoptysis, sputum production, shortness of breath and wheezing.   Cardiovascular:  Negative for chest pain, palpitations, orthopnea and claudication.  Gastrointestinal:  Negative for abdominal pain, blood in stool, constipation, diarrhea, heartburn, melena, nausea and  vomiting.  Genitourinary:  Negative for dysuria, flank pain, frequency, hematuria and urgency.  Musculoskeletal:  Negative for back pain, joint pain and myalgias.  Skin:  Negative for rash.  Neurological:  Negative for dizziness, tingling, focal weakness, seizures, weakness and headaches.  Endo/Heme/Allergies:  Does not bruise/bleed easily.  Psychiatric/Behavioral:  Negative for depression and suicidal ideas. The patient does not have insomnia.    No Known Allergies  Patient Active Problem List   Diagnosis Date Noted   Blood transfusion without reported diagnosis 05/06/2022   Gastric erythema    Adenomatous polyp of transverse colon    Anterior dislocation of left sternoclavicular joint    Hidradenitis suppurativa 08/02/2021   Aphasia as late effect of cerebrovascular accident 12/18/2019   Porokeratosis 06/10/2019   Plantar flexed metatarsal bone of left foot 06/10/2019   ASCUS of cervix with negative high risk HPV 06/04/2019   Status post bariatric surgery 10/10/2018   Sleep apnea 07/07/2016   Anemia, iron deficiency 06/27/2016   Diabetes mellitus ([place]) 03/09/2016   Acute posthemorrhagic anemia    OB + stool    Benign neoplasm of ascending colon    Sepsis ([place]) 11/09/2015   Fecal occult blood test positive 11/09/2015   Absolute anemia    Cerebral infarction due to embolism of left middle cerebral artery ([place]) 07/16/2015   Antiphospholipid syndrome ([place]) 11/17/2014   Cryptogenic stroke ([place]) 10/08/2014   Essential hypertension 10/08/2014   Obesity 10/08/2014   Vitamin D deficiency 09/08/2014   Anemia, unspecified 09/08/2014   Severe obesity (BMI >= 40) ([place]) 08/21/2013   Acquired dyslexia 07/12/2013   Aphasia due to stroke 07/12/2013  Headache 07/12/2013   Depression due to stroke 07/12/2013   History of stroke 01/24/2013   HTN (hypertension) 01/24/2013     Past Medical History:  Diagnosis Date   Blood transfusion without reported diagnosis    transfusion December  2016   Clotting disorder ([place])    left leg   Diabetes mellitus without complication ([place])    Hypertension    Other and unspecified ovarian cysts    Stroke [facility])    Vaginal Pap smear, abnormal      Past Surgical History:  Procedure Laterality Date   CLOSED REDUCTION CLAVICULAR Left 09/16/2021   Procedure: left shoulder sternoclavicular joint reduction;  Surgeon: Meredith Pel, MD;  Location: [place];  Service: Orthopedics;  Laterality: Left;   COLONOSCOPY WITH PROPOFOL N/A 11/10/2015   Procedure: COLONOSCOPY WITH PROPOFOL;  Surgeon: Lucilla Lame, MD;  Location: [facility] ENDOSCOPY;  Service: Endoscopy;  Laterality: N/A;   COLONOSCOPY WITH PROPOFOL N/A 01/28/2022   Procedure: COLONOSCOPY WITH PROPOFOL;  Surgeon: Lin Landsman, MD;  Location: [facility] ENDOSCOPY;  Service: Gastroenterology;  Laterality: N/A;   ESOPHAGOGASTRODUODENOSCOPY (EGD) WITH PROPOFOL N/A 11/10/2015   Procedure: ESOPHAGOGASTRODUODENOSCOPY (EGD) WITH PROPOFOL;  Surgeon: Lucilla Lame, MD;  Location: [facility] ENDOSCOPY;  Service: Endoscopy;  Laterality: N/A;   ESOPHAGOGASTRODUODENOSCOPY (EGD) WITH PROPOFOL N/A 01/28/2022   Procedure: ESOPHAGOGASTRODUODENOSCOPY (EGD) WITH PROPOFOL;  Surgeon: Lin Landsman, MD;  Location: [facility] ENDOSCOPY;  Service: Gastroenterology;  Laterality: N/A;   LAPAROSCOPIC GASTRIC SLEEVE RESECTION  02/17/2016   placement   LOOP RECORDER IMPLANT N/A 04/25/2014   Procedure: LOOP RECORDER IMPLANT;  Surgeon: Coralyn Mark, MD;  Location: [place] CATH LAB;  Service: Cardiovascular;  Laterality: N/A;   LOOP RECORDER REMOVAL N/A 02/01/2018   Procedure: LOOP RECORDER REMOVAL;  Surgeon: Deboraha Sprang, MD;  Location: [place] CV LAB;  Service: Cardiovascular;  Laterality: N/A;   OVARIAN CYST REMOVAL     TEE WITHOUT CARDIOVERSION N/A 01/25/2013   Procedure: TRANSESOPHAGEAL ECHOCARDIOGRAM (TEE);  Surgeon: Birdie Riddle, MD;  Location: [facility] ENDOSCOPY;  Service: Cardiovascular;  Laterality: N/A;    Social History    Socioeconomic History   Marital status: Married    Spouse name: Not on file   Number of children: 2   Years of education: 13   Highest education level: Not on file  Occupational History   Not on file  Tobacco Use   Smoking status: Former    Packs/day: 1.00    Years: 10.00    Pack years: 10.00    Types: Cigarettes    Quit date: 07/14/2006    Years since quitting: 15.8   Smokeless tobacco: Never   Tobacco comments:    quit smoking around 2007  Vaping Use   Vaping Use: Never used  Substance and Sexual Activity   Alcohol use: No    Alcohol/week: 0.0 standard drinks   Drug use: No   Sexual activity: Yes  Other Topics Concern   Not on file  Social History Narrative   ** Merged History Encounter **       Patient is single with two children. Patient is right handed. Patient has 13 yrs of education. Patient drinks 5 sodas daily.   Social Determinants of Health   Financial Resource Strain: Not on file  Food Insecurity: Not on file  Transportation Needs: Not on file  Physical Activity: Not on file  Stress: Not on file  Social Connections: Not on file  Intimate Partner Violence: Not on file  Family History  Problem Relation Age of Onset   Diabetes Mellitus II Mother    Diabetes Mother    Stroke Mother    Transient ischemic attack Father    Stroke Father    Stroke Other    Diabetes Other    Diabetes Sister      Current Outpatient Medications:    amLODipine (NORVASC) 10 MG tablet, Take 1 tablet (10 mg total) by mouth daily., Disp: 90 tablet, Rfl: 3   atorvastatin (LIPITOR) 40 MG tablet, Take 1 tablet (40 mg total) by mouth daily., Disp: 90 tablet, Rfl: 3   Calcipotriene-Betameth Diprop (WYNZORA) 0.005-0.064 % CREA, Apply 1 application. topically as directed. Apply twice daily for two weeks then decrease once daily for 5 days. Avoid Face, groin and underarms., Disp: 60 g, Rfl: 0   cyanocobalamin 1000 MCG tablet, Take 1,000 mcg by mouth daily., Disp: , Rfl:     fluticasone (FLONASE) 50 MCG/ACT nasal spray, Place 2 sprays into both nostrils daily., Disp: 16 g, Rfl: 6   Iron, Ferrous Sulfate, 325 (65 Fe) MG TABS, Take 325 mg by mouth daily., Disp: 60 tablet, Rfl: 3   metoprolol tartrate (LOPRESSOR) 25 MG tablet, Take 1 tablet (25 mg total) by mouth 2 (two) times daily., Disp: 180 tablet, Rfl: 3   NON FORMULARY, [employer] BRAND: STOOL SOFTNERS, Disp: , Rfl:    warfarin (COUMADIN) 5 MG tablet, Take 1-2  tablets every day or as directed by the coumadin clinic., Disp: 90 tablet, Rfl: 0   Na Sulfate-K Sulfate-Mg Sulf 17.5-3.13-1.6 GM/177ML SOLN, SMARTSIG:354 Milliliter(s) By Mouth Once (Patient not taking: Reported on 05/06/2022), Disp: , Rfl:    Physical exam:  Vitals:   05/06/22 1052  BP: 126/82  Pulse: 64  Resp: 18  Temp: (!) 96.9 F (36.1 C)  SpO2: 99%  Weight: 238 lb 12.8 oz (108.3 kg)  Height: '5\' 5"'$  (1.651 m)   Physical Exam Constitutional:      General: She is not in acute distress. Cardiovascular:     Rate and Rhythm: Normal rate and regular rhythm.     Heart sounds: Normal heart sounds.  Pulmonary:     Effort: Pulmonary effort is normal.     Breath sounds: Normal breath sounds.  Abdominal:     General: Bowel sounds are normal.     Palpations: Abdomen is soft.  Skin:    General: Skin is warm and dry.  Neurological:     Mental Status: She is alert and oriented to person, place, and time.          Latest Ref Rng & Units 11/08/2021    8:40 AM  CMP  Glucose 70 - 99 mg/dL 87    BUN 6 - 24 mg/dL 16    Creatinine 0.57 - 1.00 mg/dL 1.24    Sodium 134 - 144 mmol/L 142    Potassium 3.5 - 5.2 mmol/L 3.6    Chloride 96 - 106 mmol/L 104    CO2 20 - 29 mmol/L 25    Calcium 8.7 - 10.2 mg/dL 9.0        Latest Ref Rng & Units 04/27/2022    3:12 PM  CBC  WBC 3.4 - 10.8 x10E3/uL 4.7    Hemoglobin 11.1 - 15.9 g/dL 10.8    Hematocrit 34.0 - 46.6 % 33.2    Platelets 150 - 450 x10E3/uL 254      No images are attached to the  encounter.  No results found.  Assessment and plan-  Patient is a 56 y.o. female followed for iron deficiency anemia  Patient has had chronic microcytic iron deficiency anemia with hemoglobin that typically fluctuates between 10.5-11.5.  She is postmenopausal.  Recent EGD and colonoscopy was unremarkable and she will be going for capsule endoscopy soon.  She has also had sleeve gastrectomy in the past and therefore there is concern for oral iron absorption.  We discussed oral versus IV iron since patient has not taken oral iron recently.  However given her moderate anemia and ongoing symptoms of fatigue and concern for absorption we will proceed with 5 doses of Venofer 200 mg IV.  Discussed risks and benefits of Venofer including all but not limited to possible risk of infusion reaction and anaphylaxis.  Patient understands and agrees to proceed as planned.  We will repeat CBC ferritin and iron studies in 2 months and I will see her thereafter   Thank you for this kind referral and the opportunity to participate in the care of this patient   Visit Diagnosis 1. Other iron deficiency anemia     Dr. Randa Evens, MD, MPH [facility] at [facility] [phone] 05/08/2022 6:13 PM

## 2022-05-09 ENCOUNTER — Encounter: Admission: RE | Payer: Self-pay | Source: Home / Self Care

## 2022-05-09 ENCOUNTER — Ambulatory Visit: Admission: RE | Admit: 2022-05-09 | Payer: Medicare Other | Source: Home / Self Care | Admitting: Gastroenterology

## 2022-05-09 ENCOUNTER — Encounter: Payer: Self-pay | Admitting: Certified Registered Nurse Anesthetist

## 2022-05-09 SURGERY — IMAGING PROCEDURE, GI TRACT, INTRALUMINAL, VIA CAPSULE

## 2022-05-11 ENCOUNTER — Ambulatory Visit (INDEPENDENT_AMBULATORY_CARE_PROVIDER_SITE_OTHER): Payer: Medicare Other | Admitting: Dermatology

## 2022-05-11 DIAGNOSIS — L409 Psoriasis, unspecified: Secondary | ICD-10-CM

## 2022-05-11 MED ORDER — VTAMA 1 % EX CREA: TOPICAL_CREAM | CUTANEOUS | 3 refills | Status: DC

## 2022-05-11 NOTE — Patient Instructions (Signed)

## 2022-05-11 NOTE — Progress Notes (Signed)
   Follow-Up Visit   Subjective  Mary [name] is a 56 y.o. female who presents for the following: Psoriasis (Inframammary areas - using Wynzora cream but she does have to use it everyday. ). If she does not use Wynzora every day, it recurs.  The following portions of the chart were reviewed this encounter and updated as appropriate:   Tobacco  Allergies  Meds  Problems  Med Hx  Surg Hx  Fam Hx     Review of Systems:  No other skin or systemic complaints except as noted in HPI or Assessment and Plan.  Objective  Well appearing patient in no apparent distress; mood and affect are within normal limits.  A focused examination was performed including inframammary and abdomen. Relevant physical exam findings are noted in the Assessment and Plan.  Inframammary Some erythema of the R inframammary area.    Assessment & Plan  Psoriasis Inframammary Improved compared to photo from 04/22 -   Psoriasis is a chronic non-curable, but treatable genetic/hereditary disease that may have other systemic features affecting other organ systems such as joints (Psoriatic Arthritis). It is associated with an increased risk of inflammatory bowel disease, heart disease, non-alcoholic fatty liver disease, and depression.   Chronic and persistent condition with duration or expected duration over one year. Condition is symptomatic / bothersome to patient. Not to goal.  D/C Delos Haring since she has to use it every day to control psoriasis and since it contains a steroid yet may thin the skin with daily use.   Start Vtama cream QD. If not covered or too expensive will switch to [facility].   Tapinarof (VTAMA) 1 % CREA - Inframammary Apply to aa's psoriasis QD PRN.  Return in about 1 year (around 05/12/2023) for psoriasis follow up .  Luther Redo, CMA, am acting as scribe for Sarina Ser, MD . Documentation: I have reviewed the above documentation for accuracy and completeness, and I agree with the  above.  Sarina Ser, MD

## 2022-05-16 ENCOUNTER — Encounter: Payer: Self-pay | Admitting: Dermatology

## 2022-05-17 ENCOUNTER — Other Ambulatory Visit: Payer: Self-pay | Admitting: Oncology

## 2022-05-17 DIAGNOSIS — D508 Other iron deficiency anemias: Secondary | ICD-10-CM

## 2022-05-17 DIAGNOSIS — D509 Iron deficiency anemia, unspecified: Secondary | ICD-10-CM | POA: Insufficient documentation

## 2022-05-20 ENCOUNTER — Inpatient Hospital Stay: Payer: Medicare Other | Attending: Oncology

## 2022-05-23 ENCOUNTER — Other Ambulatory Visit: Payer: Self-pay

## 2022-05-23 DIAGNOSIS — L409 Psoriasis, unspecified: Secondary | ICD-10-CM

## 2022-05-23 MED ORDER — VTAMA 1 % EX CREA: TOPICAL_CREAM | CUTANEOUS | 3 refills | Status: DC

## 2022-05-23 NOTE — Progress Notes (Signed)
Sent to [place] due to high copay at [employer]

## 2022-05-27 ENCOUNTER — Inpatient Hospital Stay: Payer: Medicare Other

## 2022-06-01 ENCOUNTER — Ambulatory Visit (INDEPENDENT_AMBULATORY_CARE_PROVIDER_SITE_OTHER): Payer: Medicare Other

## 2022-06-01 DIAGNOSIS — I639 Cerebral infarction, unspecified: Secondary | ICD-10-CM | POA: Diagnosis not present

## 2022-06-01 DIAGNOSIS — Z5181 Encounter for therapeutic drug level monitoring: Secondary | ICD-10-CM

## 2022-06-01 DIAGNOSIS — Z8673 Personal history of transient ischemic attack (TIA), and cerebral infarction without residual deficits: Secondary | ICD-10-CM | POA: Diagnosis not present

## 2022-06-01 LAB — POCT INR: INR: 2.1 (ref 2.0–3.0)

## 2022-06-01 NOTE — Patient Instructions (Signed)
Description   Take 1 tablet today, then start taking 1 tablet daily except 1/2 tablet on Mondays, Wednesdays and Fridays.  Recheck INR in 3 weeks.

## 2022-06-03 ENCOUNTER — Inpatient Hospital Stay: Payer: Medicare Other

## 2022-06-10 ENCOUNTER — Inpatient Hospital Stay: Payer: Medicare Other

## 2022-06-11 ENCOUNTER — Other Ambulatory Visit: Payer: Self-pay | Admitting: Cardiovascular Disease

## 2022-06-13 NOTE — Telephone Encounter (Signed)
Refill Request.  

## 2022-06-17 ENCOUNTER — Inpatient Hospital Stay: Payer: Medicare Other

## 2022-06-22 ENCOUNTER — Ambulatory Visit (INDEPENDENT_AMBULATORY_CARE_PROVIDER_SITE_OTHER): Payer: Medicare Other

## 2022-06-22 DIAGNOSIS — I639 Cerebral infarction, unspecified: Secondary | ICD-10-CM

## 2022-06-22 DIAGNOSIS — Z8673 Personal history of transient ischemic attack (TIA), and cerebral infarction without residual deficits: Secondary | ICD-10-CM | POA: Diagnosis not present

## 2022-06-22 DIAGNOSIS — Z5181 Encounter for therapeutic drug level monitoring: Secondary | ICD-10-CM

## 2022-06-22 LAB — POCT INR: INR: 2.9 (ref 2.0–3.0)

## 2022-06-22 NOTE — Patient Instructions (Signed)
Continue 1 tablet daily except 1/2 tablet on Mondays, Wednesdays and Fridays.  Recheck INR in 6 weeks.  

## 2022-06-30 NOTE — Telephone Encounter (Signed)
error 

## 2022-07-05 ENCOUNTER — Other Ambulatory Visit: Payer: Self-pay | Admitting: *Deleted

## 2022-07-05 DIAGNOSIS — D508 Other iron deficiency anemias: Secondary | ICD-10-CM

## 2022-07-06 ENCOUNTER — Inpatient Hospital Stay: Payer: Medicare Other | Attending: Oncology

## 2022-07-08 ENCOUNTER — Inpatient Hospital Stay: Payer: Medicare Other | Admitting: Oncology

## 2022-07-25 ENCOUNTER — Other Ambulatory Visit: Payer: Self-pay | Admitting: Dermatology

## 2022-08-03 ENCOUNTER — Ambulatory Visit: Payer: Medicare Other | Attending: Family Medicine

## 2022-08-03 ENCOUNTER — Ambulatory Visit (INDEPENDENT_AMBULATORY_CARE_PROVIDER_SITE_OTHER): Payer: Medicare Other

## 2022-08-03 DIAGNOSIS — Z8673 Personal history of transient ischemic attack (TIA), and cerebral infarction without residual deficits: Secondary | ICD-10-CM | POA: Diagnosis not present

## 2022-08-03 DIAGNOSIS — I639 Cerebral infarction, unspecified: Secondary | ICD-10-CM | POA: Diagnosis not present

## 2022-08-03 DIAGNOSIS — Z5181 Encounter for therapeutic drug level monitoring: Secondary | ICD-10-CM | POA: Diagnosis not present

## 2022-08-03 LAB — POCT INR: INR: 2.8 (ref 2.0–3.0)

## 2022-08-03 MED ORDER — WARFARIN SODIUM 5 MG PO TABS: ORAL_TABLET | ORAL | 1 refills | Status: DC

## 2022-08-03 NOTE — Patient Instructions (Signed)
Continue 1 tablet daily except 1/2 tablet on Mondays, Wednesdays and Fridays.  Recheck INR in 6 weeks.  

## 2022-08-03 NOTE — Progress Notes (Signed)
AWV was started and patient asked if she can call me back. She was provided with the number. Pt has not yet to return phone call.

## 2022-08-09 ENCOUNTER — Ambulatory Visit (INDEPENDENT_AMBULATORY_CARE_PROVIDER_SITE_OTHER): Payer: Medicare Other | Admitting: Dermatology

## 2022-08-09 DIAGNOSIS — L304 Erythema intertrigo: Secondary | ICD-10-CM | POA: Diagnosis not present

## 2022-08-09 DIAGNOSIS — L508 Other urticaria: Secondary | ICD-10-CM | POA: Diagnosis not present

## 2022-08-09 DIAGNOSIS — L409 Psoriasis, unspecified: Secondary | ICD-10-CM | POA: Diagnosis not present

## 2022-08-09 MED ORDER — TRIAMCINOLONE ACETONIDE 0.1 % EX CREA: TOPICAL_CREAM | CUTANEOUS | 1 refills | Status: DC

## 2022-08-09 MED ORDER — FLUCONAZOLE 200 MG PO TABS: ORAL_TABLET | ORAL | 0 refills | Status: DC

## 2022-08-09 NOTE — Progress Notes (Signed)
    Follow-Up Visit   Subjective  Mary [name] is a 56 y.o. female who presents for the following: psoriasis (Abdomen and infra mammary - flared for the past 2-3 weeks, very painful and itchy. Currently using Vtama TID but has noticed no improvement and would like to discuss other treatment options. No recent illnesses or stress. ).  The following portions of the chart were reviewed this encounter and updated as appropriate:   Tobacco  Allergies  Meds  Problems  Med Hx  Surg Hx  Fam Hx     Review of Systems:  No other skin or systemic complaints except as noted in HPI or Assessment and Plan.  Objective  Well appearing patient in no apparent distress; mood and affect are within normal limits.  A focused examination was performed including the trunk, face, extremities. Relevant physical exam findings are noted in the Assessment and Plan.  Abdomen, infra mammary, breast, neck      Assessment & Plan  Psoriasis Abdomen, infra mammary, breast, neck  BSA - 12% - With psoriasis inversa and intertrigo - Chronic and persistent condition with duration or expected duration over one year. Condition is bothersome/symptomatic for patient. Currently flared.  Psoriasis is a chronic non-curable, but treatable genetic/hereditary disease that may have other systemic features affecting other organ systems such as joints (Psoriatic Arthritis). It is associated with an increased risk of inflammatory bowel disease, heart disease, non-alcoholic fatty liver disease, and depression.    Consider oral Otezla and/or topical Zoryve cream in the near future if patient continues to flare.   Start TMC 0.1% cream to aa's BID x 2 weeks. Then decrease to QD 5d/wk. Topical steroids (such as triamcinolone, fluocinolone, fluocinonide, mometasone, clobetasol, halobetasol, betamethasone, hydrocortisone) can cause thinning and lightening of the skin if they are used for too long in the same area. Your physician has  selected the right strength medicine for your problem and area affected on the body. Please use your medication only as directed by your physician to prevent side effects.   Patient to contact office for appointment if condition worsens before follow up appointment in 2 months.  triamcinolone cream (KENALOG) 0.1 % - Abdomen, infra mammary, breast, neck Apply to aa's psoriasis BID for two weeks. Then apply to aa's BID up to 5d/wk. Avoid applying to face, groin, and axilla. Use as directed. Long-term use can cause thinning of the skin.  Related Medications Tapinarof (VTAMA) 1 % CREA Apply to aa's psoriasis QD PRN.  Erythema intertrigo Right Inframammary Fold With psoriasis inversa -   Start Diflucan '200mg'$  po QW x 2 weeks. #2 0RF.   fluconazole (DIFLUCAN) 200 MG tablet - Right Inframammary Fold Take one tab po QW for two weeks.  Return in about 2 months (around 10/09/2022).  Luther Redo, CMA, am acting as scribe for Sarina Ser, MD . Documentation: I have reviewed the above documentation for accuracy and completeness, and I agree with the above.  Sarina Ser, MD

## 2022-08-09 NOTE — Patient Instructions (Signed)
Due to recent changes in healthcare laws, you may see results of your pathology and/or laboratory studies on MyChart before the doctors have had a chance to review them. We understand that in some cases there may be results that are confusing or concerning to you. Please understand that not all results are received at the same time and often the doctors may need to interpret multiple results in order to provide you with the best plan of care or course of treatment. Therefore, we ask that you please give us 2 business days to thoroughly review all your results before contacting the office for clarification. Should we see a critical lab result, you will be contacted sooner.   If You Need Anything After Your Visit  If you have any questions or concerns for your doctor, please call our main line at [phone] and press option 4 to reach your doctor's medical assistant. If no one answers, please leave a voicemail as directed and we will return your call as soon as possible. Messages left after 4 pm will be answered the following business day.   You may also send us a message via MyChart. We typically respond to MyChart messages within 1-2 business days.  For prescription refills, please ask your pharmacy to contact our office. Our fax number is [phone].  If you have an urgent issue when the clinic is closed that cannot wait until the next business day, you can page your doctor at the number below.    Please note that while we do our best to be available for urgent issues outside of office hours, we are not available 24/7.   If you have an urgent issue and are unable to reach us, you may choose to seek medical care at your doctor's office, retail clinic, urgent care center, or emergency room.  If you have a medical emergency, please immediately call 911 or go to the emergency department.  Pager Numbers  - Dr. Kowalski: [phone]  - Dr. Moye: [phone]  - Dr. Stewart:  [phone]  In the event of inclement weather, please call our main line at [phone] for an update on the status of any delays or closures.  Dermatology Medication Tips: Please keep the boxes that topical medications come in in order to help keep track of the instructions about where and how to use these. Pharmacies typically print the medication instructions only on the boxes and not directly on the medication tubes.   If your medication is too expensive, please contact our office at [phone] option 4 or send us a message through MyChart.   We are unable to tell what your co-pay for medications will be in advance as this is different depending on your insurance coverage. However, we may be able to find a substitute medication at lower cost or fill out paperwork to get insurance to cover a needed medication.   If a prior authorization is required to get your medication covered by your insurance company, please allow us 1-2 business days to complete this process.  Drug prices often vary depending on where the prescription is filled and some pharmacies may offer cheaper prices.  The website www.goodrx.com contains coupons for medications through different pharmacies. The prices here do not account for what the cost may be with help from insurance (it may be cheaper with your insurance), but the website can give you the price if you did not use any insurance.  - You can print the associated coupon and take it with   your prescription to the pharmacy.  - You may also stop by our office during regular business hours and pick up a GoodRx coupon card.  - If you need your prescription sent electronically to a different pharmacy, notify our office through [place] MyChart or by phone at [phone] option 4.     Si Usted Necesita Algo Despus de Su Visita  Tambin puede enviarnos un mensaje a travs de MyChart. Por lo general respondemos a los mensajes de MyChart en el transcurso de 1 a 2  das hbiles.  Para renovar recetas, por favor pida a su farmacia que se ponga en contacto con nuestra oficina. Nuestro nmero de fax es el [phone].  Si tiene un asunto urgente cuando la clnica est cerrada y que no puede esperar hasta el siguiente da hbil, puede llamar/localizar a su doctor(a) al nmero que aparece a continuacin.   Por favor, tenga en cuenta que aunque hacemos todo lo posible para estar disponibles para asuntos urgentes fuera del horario de oficina, no estamos disponibles las 24 horas del da, los 7 das de la semana.   Si tiene un problema urgente y no puede comunicarse con nosotros, puede optar por buscar atencin mdica  en el consultorio de su doctor(a), en una clnica privada, en un centro de atencin urgente o en una sala de emergencias.  Si tiene una emergencia mdica, por favor llame inmediatamente al 911 o vaya a la sala de emergencias.  Nmeros de bper  - Dr. Kowalski: [phone]  - Dra. Moye: [phone]  - Dra. Stewart: [phone]  En caso de inclemencias del tiempo, por favor llame a nuestra lnea principal al [phone] para una actualizacin sobre el estado de cualquier retraso o cierre.  Consejos para la medicacin en dermatologa: Por favor, guarde las cajas en las que vienen los medicamentos de uso tpico para ayudarle a seguir las instrucciones sobre dnde y cmo usarlos. Las farmacias generalmente imprimen las instrucciones del medicamento slo en las cajas y no directamente en los tubos del medicamento.   Si su medicamento es muy caro, por favor, pngase en contacto con nuestra oficina llamando al [phone] y presione la opcin 4 o envenos un mensaje a travs de MyChart.   No podemos decirle cul ser su copago por los medicamentos por adelantado ya que esto es diferente dependiendo de la cobertura de su seguro. Sin embargo, es posible que podamos encontrar un medicamento sustituto a menor costo o llenar un formulario para que el  seguro cubra el medicamento que se considera necesario.   Si se requiere una autorizacin previa para que su compaa de seguros cubra su medicamento, por favor permtanos de 1 a 2 das hbiles para completar este proceso.  Los precios de los medicamentos varan con frecuencia dependiendo del lugar de dnde se surte la receta y alguna farmacias pueden ofrecer precios ms baratos.  El sitio web www.goodrx.com tiene cupones para medicamentos de diferentes farmacias. Los precios aqu no tienen en cuenta lo que podra costar con la ayuda del seguro (puede ser ms barato con su seguro), pero el sitio web puede darle el precio si no utiliz ningn seguro.  - Puede imprimir el cupn correspondiente y llevarlo con su receta a la farmacia.  - Tambin puede pasar por nuestra oficina durante el horario de atencin regular y recoger una tarjeta de cupones de GoodRx.  - Si necesita que su receta se enve electrnicamente a una farmacia diferente, informe a nuestra oficina a travs de MyChart de [place]   o por telfono llamando al [phone] y presione la opcin 4.  

## 2022-08-12 ENCOUNTER — Encounter: Payer: Self-pay | Admitting: Dermatology

## 2022-08-15 ENCOUNTER — Other Ambulatory Visit: Payer: Self-pay

## 2022-08-15 ENCOUNTER — Telehealth: Payer: Self-pay

## 2022-08-15 DIAGNOSIS — L304 Erythema intertrigo: Secondary | ICD-10-CM

## 2022-08-15 MED ORDER — FLUCONAZOLE 200 MG PO TABS: ORAL_TABLET | ORAL | 0 refills | Status: DC

## 2022-08-15 MED ORDER — MOMETASONE FUROATE 0.1 % EX CREA: 1.0000 | TOPICAL_CREAM | Freq: Every day | CUTANEOUS | 0 refills | Status: DC | PRN

## 2022-08-15 NOTE — Progress Notes (Signed)
Rx resent due to [facility] saying that they didn't have it.

## 2022-08-15 NOTE — Telephone Encounter (Signed)
Pt called here, I discussed TMC cream is not to used on her groin, advised that Mometasone could be used in her groin folds.

## 2022-08-15 NOTE — Progress Notes (Signed)
Patient came into office today stating that the directions on her TMC cream said to not used on her groin area. Dr. Nehemiah Massed advised that Mometasone could be used in her groin folds. Escripted to [employer]. Patient was called to make aware of this but there was no answer and her VM box was full.

## 2022-09-04 ENCOUNTER — Other Ambulatory Visit: Payer: Self-pay | Admitting: Dermatology

## 2022-09-07 ENCOUNTER — Encounter: Payer: Self-pay | Admitting: Oncology

## 2022-09-14 ENCOUNTER — Ambulatory Visit: Payer: Medicare Other | Attending: Cardiovascular Disease

## 2022-09-14 DIAGNOSIS — Z8673 Personal history of transient ischemic attack (TIA), and cerebral infarction without residual deficits: Secondary | ICD-10-CM

## 2022-09-14 DIAGNOSIS — I639 Cerebral infarction, unspecified: Secondary | ICD-10-CM | POA: Diagnosis not present

## 2022-09-14 DIAGNOSIS — Z5181 Encounter for therapeutic drug level monitoring: Secondary | ICD-10-CM

## 2022-09-14 LAB — POCT INR: INR: 1.8 — AB (ref 2.0–3.0)

## 2022-09-14 NOTE — Patient Instructions (Signed)
TAKE 1.5 TABLETS TODAY ONLY AND THEN Continue 1 tablet daily except 1/2 tablet on Mondays, Wednesdays and Fridays.  Recheck INR in 3 weeks.

## 2022-10-05 ENCOUNTER — Ambulatory Visit: Payer: Medicare Other | Attending: Cardiovascular Disease

## 2022-10-05 DIAGNOSIS — I639 Cerebral infarction, unspecified: Secondary | ICD-10-CM | POA: Diagnosis not present

## 2022-10-05 DIAGNOSIS — Z5181 Encounter for therapeutic drug level monitoring: Secondary | ICD-10-CM | POA: Diagnosis not present

## 2022-10-05 LAB — POCT INR: INR: 3.8 — AB (ref 2.0–3.0)

## 2022-10-05 NOTE — Patient Instructions (Signed)
Continue 1 tablet daily except 1/2 tablet on Mondays, Wednesdays and Fridays.  Recheck INR in 5 weeks.

## 2022-10-10 ENCOUNTER — Ambulatory Visit: Payer: Medicare Other | Admitting: Dermatology

## 2022-10-12 ENCOUNTER — Other Ambulatory Visit: Payer: Medicare Other

## 2022-10-12 ENCOUNTER — Ambulatory Visit: Payer: Medicare Other | Admitting: Oncology

## 2022-10-13 ENCOUNTER — Ambulatory Visit: Payer: Medicare Other | Admitting: Dermatology

## 2022-10-13 DIAGNOSIS — Z79899 Other long term (current) drug therapy: Secondary | ICD-10-CM | POA: Diagnosis not present

## 2022-10-13 DIAGNOSIS — L409 Psoriasis, unspecified: Secondary | ICD-10-CM

## 2022-10-13 NOTE — Patient Instructions (Signed)
Due to recent changes in healthcare laws, you may see results of your pathology and/or laboratory studies on MyChart before the doctors have had a chance to review them. We understand that in some cases there may be results that are confusing or concerning to you. Please understand that not all results are received at the same time and often the doctors may need to interpret multiple results in order to provide you with the best plan of care or course of treatment. Therefore, we ask that you please give us 2 business days to thoroughly review all your results before contacting the office for clarification. Should we see a critical lab result, you will be contacted sooner.   If You Need Anything After Your Visit  If you have any questions or concerns for your doctor, please call our main line at [phone] and press option 4 to reach your doctor's medical assistant. If no one answers, please leave a voicemail as directed and we will return your call as soon as possible. Messages left after 4 pm will be answered the following business day.   You may also send us a message via MyChart. We typically respond to MyChart messages within 1-2 business days.  For prescription refills, please ask your pharmacy to contact our office. Our fax number is [phone].  If you have an urgent issue when the clinic is closed that cannot wait until the next business day, you can page your doctor at the number below.    Please note that while we do our best to be available for urgent issues outside of office hours, we are not available 24/7.   If you have an urgent issue and are unable to reach us, you may choose to seek medical care at your doctor's office, retail clinic, urgent care center, or emergency room.  If you have a medical emergency, please immediately call 911 or go to the emergency department.  Pager Numbers  - Dr. Kowalski: [phone]  - Dr. Moye: [phone]  - Dr. Stewart:  [phone]  In the event of inclement weather, please call our main line at [phone] for an update on the status of any delays or closures.  Dermatology Medication Tips: Please keep the boxes that topical medications come in in order to help keep track of the instructions about where and how to use these. Pharmacies typically print the medication instructions only on the boxes and not directly on the medication tubes.   If your medication is too expensive, please contact our office at [phone] option 4 or send us a message through MyChart.   We are unable to tell what your co-pay for medications will be in advance as this is different depending on your insurance coverage. However, we may be able to find a substitute medication at lower cost or fill out paperwork to get insurance to cover a needed medication.   If a prior authorization is required to get your medication covered by your insurance company, please allow us 1-2 business days to complete this process.  Drug prices often vary depending on where the prescription is filled and some pharmacies may offer cheaper prices.  The website www.goodrx.com contains coupons for medications through different pharmacies. The prices here do not account for what the cost may be with help from insurance (it may be cheaper with your insurance), but the website can give you the price if you did not use any insurance.  - You can print the associated coupon and take it with   your prescription to the pharmacy.  - You may also stop by our office during regular business hours and pick up a GoodRx coupon card.  - If you need your prescription sent electronically to a different pharmacy, notify our office through [facility] MyChart or by phone at [phone] option 4.     Si Usted Necesita Algo Despus de Su Visita  Tambin puede enviarnos un mensaje a travs de MyChart. Por lo general respondemos a los mensajes de MyChart en el transcurso de 1 a 2  das hbiles.  Para renovar recetas, por favor pida a su farmacia que se ponga en contacto con nuestra oficina. Nuestro nmero de fax es el [phone].  Si tiene un asunto urgente cuando la clnica est cerrada y que no puede esperar hasta el siguiente da hbil, puede llamar/localizar a su doctor(a) al nmero que aparece a continuacin.   Por favor, tenga en cuenta que aunque hacemos todo lo posible para estar disponibles para asuntos urgentes fuera del horario de oficina, no estamos disponibles las 24 horas del da, los 7 das de la semana.   Si tiene un problema urgente y no puede comunicarse con nosotros, puede optar por buscar atencin mdica  en el consultorio de su doctor(a), en una clnica privada, en un centro de atencin urgente o en una sala de emergencias.  Si tiene una emergencia mdica, por favor llame inmediatamente al 911 o vaya a la sala de emergencias.  Nmeros de bper  - Dr. Kowalski: [phone]  - Dra. Moye: [phone]  - Dra. Stewart: [phone]  En caso de inclemencias del tiempo, por favor llame a nuestra lnea principal al [phone] para una actualizacin sobre el estado de cualquier retraso o cierre.  Consejos para la medicacin en dermatologa: Por favor, guarde las cajas en las que vienen los medicamentos de uso tpico para ayudarle a seguir las instrucciones sobre dnde y cmo usarlos. Las farmacias generalmente imprimen las instrucciones del medicamento slo en las cajas y no directamente en los tubos del medicamento.   Si su medicamento es muy caro, por favor, pngase en contacto con nuestra oficina llamando al [phone] y presione la opcin 4 o envenos un mensaje a travs de MyChart.   No podemos decirle cul ser su copago por los medicamentos por adelantado ya que esto es diferente dependiendo de la cobertura de su seguro. Sin embargo, es posible que podamos encontrar un medicamento sustituto a menor costo o llenar un formulario para que el  seguro cubra el medicamento que se considera necesario.   Si se requiere una autorizacin previa para que su compaa de seguros cubra su medicamento, por favor permtanos de 1 a 2 das hbiles para completar este proceso.  Los precios de los medicamentos varan con frecuencia dependiendo del lugar de dnde se surte la receta y alguna farmacias pueden ofrecer precios ms baratos.  El sitio web www.goodrx.com tiene cupones para medicamentos de diferentes farmacias. Los precios aqu no tienen en cuenta lo que podra costar con la ayuda del seguro (puede ser ms barato con su seguro), pero el sitio web puede darle el precio si no utiliz ningn seguro.  - Puede imprimir el cupn correspondiente y llevarlo con su receta a la farmacia.  - Tambin puede pasar por nuestra oficina durante el horario de atencin regular y recoger una tarjeta de cupones de GoodRx.  - Si necesita que su receta se enve electrnicamente a una farmacia diferente, informe a nuestra oficina a travs de MyChart de [place]   o por telfono llamando al [phone] y presione la opcin 4.  

## 2022-10-13 NOTE — Progress Notes (Signed)
   Follow-Up Visit   Subjective  Mary [name] is a 56 y.o. female who presents for the following: Psoriasis (2 months f/u on Psoriasis on her chest, treating with Triamcinolone cream with a poor response, patient report skin sometimes itchy.).  The following portions of the chart were reviewed this encounter and updated as appropriate:   Tobacco  Allergies  Meds  Problems  Med Hx  Surg Hx  Fam Hx     Review of Systems:  No other skin or systemic complaints except as noted in HPI or Assessment and Plan.  Objective  Well appearing patient in no apparent distress; mood and affect are within normal limits.  A focused examination was performed including abdomen, breast, neck. Relevant physical exam findings are noted in the Assessment and Plan.  Abdomen, infra mammary, breast, neck Well-demarcated erythematous papules/plaques with silvery scale, guttate pink scaly papules, hyperpigmented patches    Assessment & Plan  Psoriasis Abdomen, infra mammary, breast, neck  BSA - 12% - With psoriasis inversa and intertrigo - Chronic and persistent condition with duration or expected duration over one year. Condition is bothersome/symptomatic for patient. Currently flared.   Psoriasis is a chronic non-curable, but treatable genetic/hereditary disease that may have other systemic features affecting other organ systems such as joints (Psoriatic Arthritis). It is associated with an increased risk of inflammatory bowel disease, heart disease, non-alcoholic fatty liver disease, and depression.     Start 2 sample packets Otezla tablets titrating up to 30 mg daily     Side effects of Otezla (apremilast) include diarrhea, nausea, headache, upper respiratory infection, depression, and weight decrease (5-10%). It should only be taken by pregnant women after a discussion regarding risks and benefits with their doctor. Goal is control of skin condition, not cure.  The use of [name] requires long term  medication management, including periodic office visits.   Related Medications triamcinolone cream (KENALOG) 0.1 % Apply to aa's psoriasis BID for two weeks. Then apply to aa's BID up to 5d/wk. Avoid applying to face, groin, and axilla. Use as directed. Long-term use can cause thinning of the skin.  Return in about 6 weeks (around 11/24/2022) for Psoriasis .  IMarye Round, CMA, am acting as scribe for Sarina Ser, MD .  Documentation: I have reviewed the above documentation for accuracy and completeness, and I agree with the above.  Sarina Ser, MD

## 2022-10-14 ENCOUNTER — Inpatient Hospital Stay: Payer: Medicare Other | Attending: Oncology

## 2022-10-14 ENCOUNTER — Inpatient Hospital Stay: Payer: Medicare Other | Admitting: Oncology

## 2022-10-17 ENCOUNTER — Encounter: Payer: Self-pay | Admitting: Dermatology

## 2022-10-31 ENCOUNTER — Other Ambulatory Visit: Payer: Self-pay | Admitting: Family Medicine

## 2022-10-31 DIAGNOSIS — Z1231 Encounter for screening mammogram for malignant neoplasm of breast: Secondary | ICD-10-CM

## 2022-11-01 DIAGNOSIS — Z03818 Encounter for observation for suspected exposure to other biological agents ruled out: Secondary | ICD-10-CM | POA: Diagnosis not present

## 2022-11-09 ENCOUNTER — Ambulatory Visit: Payer: Medicare Other

## 2022-11-23 ENCOUNTER — Ambulatory Visit: Payer: Medicare Other | Attending: Cardiovascular Disease

## 2022-11-23 DIAGNOSIS — Z8673 Personal history of transient ischemic attack (TIA), and cerebral infarction without residual deficits: Secondary | ICD-10-CM | POA: Diagnosis not present

## 2022-11-23 DIAGNOSIS — Z5181 Encounter for therapeutic drug level monitoring: Secondary | ICD-10-CM | POA: Diagnosis not present

## 2022-11-23 DIAGNOSIS — I639 Cerebral infarction, unspecified: Secondary | ICD-10-CM | POA: Diagnosis not present

## 2022-11-23 LAB — POCT INR: INR: 3.6 — AB (ref 2.0–3.0)

## 2022-11-23 NOTE — Patient Instructions (Signed)
Continue 1 tablet daily except 1/2 tablet on Mondays, Wednesdays and Fridays.  Recheck INR in 6 weeks.

## 2022-11-24 ENCOUNTER — Ambulatory Visit (INDEPENDENT_AMBULATORY_CARE_PROVIDER_SITE_OTHER): Payer: Medicare Other | Admitting: Dermatology

## 2022-11-24 DIAGNOSIS — L409 Psoriasis, unspecified: Secondary | ICD-10-CM | POA: Diagnosis not present

## 2022-11-24 DIAGNOSIS — Z79899 Other long term (current) drug therapy: Secondary | ICD-10-CM

## 2022-11-24 MED ORDER — OTEZLA 30 MG PO TABS: ORAL_TABLET | ORAL | 3 refills | Status: DC

## 2022-11-24 NOTE — Patient Instructions (Signed)
Due to recent changes in healthcare laws, you may see results of your pathology and/or laboratory studies on MyChart before the doctors have had a chance to review them. We understand that in some cases there may be results that are confusing or concerning to you. Please understand that not all results are received at the same time and often the doctors may need to interpret multiple results in order to provide you with the best plan of care or course of treatment. Therefore, we ask that you please give us 2 business days to thoroughly review all your results before contacting the office for clarification. Should we see a critical lab result, you will be contacted sooner.   If You Need Anything After Your Visit  If you have any questions or concerns for your doctor, please call our main line at [phone] and press option 4 to reach your doctor's medical assistant. If no one answers, please leave a voicemail as directed and we will return your call as soon as possible. Messages left after 4 pm will be answered the following business day.   You may also send us a message via MyChart. We typically respond to MyChart messages within 1-2 business days.  For prescription refills, please ask your pharmacy to contact our office. Our fax number is [phone].  If you have an urgent issue when the clinic is closed that cannot wait until the next business day, you can page your doctor at the number below.    Please note that while we do our best to be available for urgent issues outside of office hours, we are not available 24/7.   If you have an urgent issue and are unable to reach us, you may choose to seek medical care at your doctor's office, retail clinic, urgent care center, or emergency room.  If you have a medical emergency, please immediately call 911 or go to the emergency department.  Pager Numbers  - Dr. Kowalski: [phone]  - Dr. Moye: [phone]  - Dr. Stewart:  [phone]  In the event of inclement weather, please call our main line at [phone] for an update on the status of any delays or closures.  Dermatology Medication Tips: Please keep the boxes that topical medications come in in order to help keep track of the instructions about where and how to use these. Pharmacies typically print the medication instructions only on the boxes and not directly on the medication tubes.   If your medication is too expensive, please contact our office at [phone] option 4 or send us a message through MyChart.   We are unable to tell what your co-pay for medications will be in advance as this is different depending on your insurance coverage. However, we may be able to find a substitute medication at lower cost or fill out paperwork to get insurance to cover a needed medication.   If a prior authorization is required to get your medication covered by your insurance company, please allow us 1-2 business days to complete this process.  Drug prices often vary depending on where the prescription is filled and some pharmacies may offer cheaper prices.  The website www.goodrx.com contains coupons for medications through different pharmacies. The prices here do not account for what the cost may be with help from insurance (it may be cheaper with your insurance), but the website can give you the price if you did not use any insurance.  - You can print the associated coupon and take it with   your prescription to the pharmacy.  - You may also stop by our office during regular business hours and pick up a GoodRx coupon card.  - If you need your prescription sent electronically to a different pharmacy, notify our office through [facility] MyChart or by phone at [phone] option 4.     Si Usted Necesita Algo Despus de Su Visita  Tambin puede enviarnos un mensaje a travs de MyChart. Por lo general respondemos a los mensajes de MyChart en el transcurso de 1 a 2  das hbiles.  Para renovar recetas, por favor pida a su farmacia que se ponga en contacto con nuestra oficina. Nuestro nmero de fax es el [phone].  Si tiene un asunto urgente cuando la clnica est cerrada y que no puede esperar hasta el siguiente da hbil, puede llamar/localizar a su doctor(a) al nmero que aparece a continuacin.   Por favor, tenga en cuenta que aunque hacemos todo lo posible para estar disponibles para asuntos urgentes fuera del horario de oficina, no estamos disponibles las 24 horas del da, los 7 das de la semana.   Si tiene un problema urgente y no puede comunicarse con nosotros, puede optar por buscar atencin mdica  en el consultorio de su doctor(a), en una clnica privada, en un centro de atencin urgente o en una sala de emergencias.  Si tiene una emergencia mdica, por favor llame inmediatamente al 911 o vaya a la sala de emergencias.  Nmeros de bper  - Dr. Kowalski: [phone]  - Dra. Moye: [phone]  - Dra. Stewart: [phone]  En caso de inclemencias del tiempo, por favor llame a nuestra lnea principal al [phone] para una actualizacin sobre el estado de cualquier retraso o cierre.  Consejos para la medicacin en dermatologa: Por favor, guarde las cajas en las que vienen los medicamentos de uso tpico para ayudarle a seguir las instrucciones sobre dnde y cmo usarlos. Las farmacias generalmente imprimen las instrucciones del medicamento slo en las cajas y no directamente en los tubos del medicamento.   Si su medicamento es muy caro, por favor, pngase en contacto con nuestra oficina llamando al [phone] y presione la opcin 4 o envenos un mensaje a travs de MyChart.   No podemos decirle cul ser su copago por los medicamentos por adelantado ya que esto es diferente dependiendo de la cobertura de su seguro. Sin embargo, es posible que podamos encontrar un medicamento sustituto a menor costo o llenar un formulario para que el  seguro cubra el medicamento que se considera necesario.   Si se requiere una autorizacin previa para que su compaa de seguros cubra su medicamento, por favor permtanos de 1 a 2 das hbiles para completar este proceso.  Los precios de los medicamentos varan con frecuencia dependiendo del lugar de dnde se surte la receta y alguna farmacias pueden ofrecer precios ms baratos.  El sitio web www.goodrx.com tiene cupones para medicamentos de diferentes farmacias. Los precios aqu no tienen en cuenta lo que podra costar con la ayuda del seguro (puede ser ms barato con su seguro), pero el sitio web puede darle el precio si no utiliz ningn seguro.  - Puede imprimir el cupn correspondiente y llevarlo con su receta a la farmacia.  - Tambin puede pasar por nuestra oficina durante el horario de atencin regular y recoger una tarjeta de cupones de GoodRx.  - Si necesita que su receta se enve electrnicamente a una farmacia diferente, informe a nuestra oficina a travs de MyChart de [facility]   o por telfono llamando al [phone] y presione la opcin 4.  

## 2022-11-24 NOTE — Progress Notes (Signed)
   Follow-Up Visit   Subjective  Mary [name] is a 56 y.o. female who presents for the following: Psoriasis (6 weeks f/u on Psoriasis treating with Otezla 30 mg once a day with a good response, no side effects from [place], using Triamcinolone cream 5 night. ).  The following portions of the chart were reviewed this encounter and updated as appropriate:   Tobacco  Allergies  Meds  Problems  Med Hx  Surg Hx  Fam Hx     Review of Systems:  No other skin or systemic complaints except as noted in HPI or Assessment and Plan.  Objective  Well appearing patient in no apparent distress; mood and affect are within normal limits.  A focused examination was performed including chest,neck,abdomen. Relevant physical exam findings are noted in the Assessment and Plan.  abdomen, inframammary, breast, neck guttate pink scaly papules, improving   Assessment & Plan  Psoriasis abdomen, inframammary, breast, neck  Psoriasis improving with current regimen  Psoriasis is a chronic non-curable, but treatable genetic/hereditary disease that may have other systemic features affecting other organ systems such as joints (Psoriatic Arthritis). It is associated with an increased risk of inflammatory bowel disease, heart disease, non-alcoholic fatty liver disease, and depression.     Side effects of Otezla (apremilast) include diarrhea, nausea, headache, upper respiratory infection, depression, and weight decrease (5-10%). It should only be taken by pregnant women after a discussion regarding risks and benefits with their doctor. Goal is control of skin condition, not cure.  The use of [name] requires long term medication management, including periodic office visits.   Continue Otezla 30 mg daily Decrease Triamcinolone cream apply to affected skin 3 nights a week   Otezla prescription assistance form sent with patient to complete and bring back.  Related Medications Apremilast (OTEZLA) 30 MG TABS Take  1 tablet daily   Return in about 3 months (around 02/23/2023) for Psoriasis .  IMarye Round, CMA, am acting as scribe for Sarina Ser, MD .  Documentation: I have reviewed the above documentation for accuracy and completeness, and I agree with the above.  Sarina Ser, MD

## 2022-11-29 ENCOUNTER — Other Ambulatory Visit: Payer: Self-pay

## 2022-11-29 ENCOUNTER — Ambulatory Visit: Payer: Medicare Other | Admitting: Family Medicine

## 2022-11-29 DIAGNOSIS — L409 Psoriasis, unspecified: Secondary | ICD-10-CM

## 2022-11-29 MED ORDER — OTEZLA 30 MG PO TABS: 1.0000 | ORAL_TABLET | Freq: Two times a day (BID) | ORAL | 2 refills | Status: DC

## 2022-11-29 NOTE — Progress Notes (Signed)
Pharmacy would not accept [facility] written take 1 tablet PO daily. RX sent in take 1 PO BID. Patient advised to continue once daily. aw

## 2022-12-04 ENCOUNTER — Encounter: Payer: Self-pay | Admitting: Dermatology

## 2022-12-16 ENCOUNTER — Ambulatory Visit
Admission: RE | Admit: 2022-12-16 | Discharge: 2022-12-16 | Disposition: A | Discharge status: Home / Self Care | Payer: Medicare Other | Source: Ambulatory Visit | Attending: Family Medicine | Admitting: Family Medicine

## 2022-12-16 DIAGNOSIS — Z1231 Encounter for screening mammogram for malignant neoplasm of breast: Secondary | ICD-10-CM | POA: Insufficient documentation

## 2022-12-23 ENCOUNTER — Other Ambulatory Visit: Payer: Self-pay | Admitting: Cardiovascular Disease

## 2022-12-23 ENCOUNTER — Other Ambulatory Visit: Payer: Self-pay | Admitting: Dermatology

## 2022-12-23 DIAGNOSIS — E1159 Type 2 diabetes mellitus with other circulatory complications: Secondary | ICD-10-CM

## 2022-12-23 NOTE — Telephone Encounter (Signed)
Please schedule 12 month F/U appt for 90 day refills. Thank you! 

## 2022-12-27 ENCOUNTER — Ambulatory Visit: Payer: Medicare Other | Attending: Medical | Admitting: Medical

## 2022-12-27 ENCOUNTER — Encounter: Payer: Self-pay | Admitting: Medical

## 2022-12-27 ENCOUNTER — Telehealth: Payer: Self-pay | Admitting: Cardiovascular Disease

## 2022-12-27 VITALS — BP 122/72 | HR 67 | Ht 65.0 in | Wt 229.1 lb

## 2022-12-27 DIAGNOSIS — G4730 Sleep apnea, unspecified: Secondary | ICD-10-CM | POA: Diagnosis not present

## 2022-12-27 DIAGNOSIS — E785 Hyperlipidemia, unspecified: Secondary | ICD-10-CM | POA: Diagnosis not present

## 2022-12-27 DIAGNOSIS — E1159 Type 2 diabetes mellitus with other circulatory complications: Secondary | ICD-10-CM

## 2022-12-27 DIAGNOSIS — D6861 Antiphospholipid syndrome: Secondary | ICD-10-CM | POA: Diagnosis not present

## 2022-12-27 DIAGNOSIS — I639 Cerebral infarction, unspecified: Secondary | ICD-10-CM | POA: Diagnosis not present

## 2022-12-27 DIAGNOSIS — I10 Essential (primary) hypertension: Secondary | ICD-10-CM

## 2022-12-27 MED ORDER — AMLODIPINE BESYLATE 10 MG PO TABS: 10.0000 mg | ORAL_TABLET | Freq: Every day | ORAL | 2 refills | Status: DC

## 2022-12-27 NOTE — Patient Instructions (Signed)
Medication Instructions:  Your Physician recommend you continue on your current medication as directed.    *If you need a refill on your cardiac medications before your next appointment, please call your pharmacy*   Lab Work: None ordered today   Testing/Procedures: None ordered today   Follow-Up: At [facility], you and your health needs are our priority.  As part of our continuing mission to provide you with exceptional heart care, we have created designated Provider Care Teams.  These Care Teams include your primary Cardiologist (physician) and Advanced Practice Providers (APPs -  Physician Assistants and Nurse Practitioners) who all work together to provide you with the care you need, when you need it.  We recommend signing up for the patient portal called "MyChart".  Sign up information is provided on this After Visit Summary.  MyChart is used to connect with patients for Virtual Visits (Telemedicine).  Patients are able to view lab/test results, encounter notes, upcoming appointments, etc.  Non-urgent messages can be sent to your provider as well.   To learn more about what you can do with MyChart, go to [email or web address].    Your next appointment:   1 year(s)  The format for your next appointment:   In Person  Provider:   You may see Ida Rogue, MD or one of the following Advanced Practice Providers on your designated Care Team:   Murray Hodgkins, NP Christell Faith, PA-C Cadence Kathlen Mody, PA-C Gerrie Nordmann, NP

## 2022-12-27 NOTE — Telephone Encounter (Signed)
*  STAT* If patient is at the pharmacy, call can be transferred to refill team.   1. Which medications need to be refilled? (please list name of each medication and dose if known)   amLODipine (NORVASC) 10 MG tablet   2. Which pharmacy/location (including street and city if local pharmacy) is medication to be sent to?  [place], [place]   3. Do they need a 30 day or 90 day supply? 90 day  Patient stated has 1 tablet left.

## 2022-12-27 NOTE — Telephone Encounter (Signed)
Requested Prescriptions   Signed Prescriptions Disp Refills   amLODipine (NORVASC) 10 MG tablet 90 tablet 2    Sig: Take 1 tablet (10 mg total) by mouth daily.    Authorizing Provider: Antony Madura    Ordering User: Raelene Bott, Sanaia Jasso L

## 2022-12-27 NOTE — Progress Notes (Signed)
Cardiology Office Note:    Date:  12/27/2022   ID:  Mary [name], DOB 09-15-1966, MRN [id number]  PCP:  [name], MD  [facility] Cardiologist:  Ida Rogue, MD  [facility] Electrophysiologist:  Virl Axe, MD   Referring MD: [name], MD   Chief Complaint: 12 month follow-up  History of Present Illness:    Mary [name] is a 57 y.o. female with a hx of stroke with residual difficulty with walking, antiphospholipid syndrome on warfarin, OSA not on CPAP, previous Loop monitor in place removed in 2019, prior bariatric surgery presents for 103-monthfollow-up.  Patient was admitted to the hospital in 2014 with acute CVA. Surface echo in 01/2013 showed an EF of 60 to 65%, moderate LVH, no diagnostic regional wall motion abnormality, grade 1 diastolic dysfunction, normal RV cavity size with normal RV systolic function, no significant valvular abnormalities with the study being overall technically difficult. TEE during that admission showed an EF of 60 to 65% with no echocardiographic evidence of thrombus. Patient subsequently underwent loop recorder implantation in 2015 with several episodes of 3+ second pauses noted over the years. Echo in 2017 showed an EF of 60 to 65%, no regional wall motion normalities, normal LV diastolic function, normal size left atrium, normal RV systolic function, normal PASP, no significant valvular abnormalities.   Patient had a cardiac CTA in 2020 that showed normal coronaries.  Echo in 2021 showed LVEF 60 to 65%, no wall motion abilities.  Today, the patient reports she is doing well. She denies chest pain and shortness of breath. She works for [employer] stays very active. No lower leg edema, orthopnea or pnd. Patient is not interested in CPAP use. No lightheadedness or dizziness. We follow her coumadin checks/INR. Needs refill of lopressor, also she is only taking this once a day.   Past Medical History:  Diagnosis Date   Blood  transfusion without reported diagnosis    transfusion December 2016   Clotting disorder ([facility]    left leg   Diabetes mellitus without complication ([place]    Hypertension    Other and unspecified ovarian cysts    Stroke ([facility]    Vaginal Pap smear, abnormal     Past Surgical History:  Procedure Laterality Date   CLOSED REDUCTION CLAVICULAR Left 09/16/2021   Procedure: left shoulder sternoclavicular joint reduction;  Surgeon: DMeredith Pel MD;  [place] [facility]  Service: Orthopedics;  Laterality: Left;   COLONOSCOPY WITH PROPOFOL N/A 11/10/2015   Procedure: COLONOSCOPY WITH PROPOFOL;  Surgeon: DLucilla Lame MD;  Location: [facility] ENDOSCOPY;  Service: Endoscopy;  Laterality: N/A;   COLONOSCOPY WITH PROPOFOL N/A 01/28/2022   Procedure: COLONOSCOPY WITH PROPOFOL;  Surgeon: VLin Landsman MD;  [facility];  Service: Gastroenterology;  Laterality: N/A;   ESOPHAGOGASTRODUODENOSCOPY (EGD) WITH PROPOFOL N/A 11/10/2015   Procedure: ESOPHAGOGASTRODUODENOSCOPY (EGD) WITH PROPOFOL;  Surgeon: DLucilla Lame MD;  Location: [facility] ENDOSCOPY;  Service: Endoscopy;  Laterality: N/A;   ESOPHAGOGASTRODUODENOSCOPY (EGD) WITH PROPOFOL N/A 01/28/2022   Procedure: ESOPHAGOGASTRODUODENOSCOPY (EGD) WITH PROPOFOL;  Surgeon: VLin Landsman MD;  [facility];  Service: Gastroenterology;  Laterality: N/A;   LAPAROSCOPIC GASTRIC SLEEVE RESECTION  02/17/2016   placement   LOOP RECORDER IMPLANT N/A 04/25/2014   Procedure: LOOP RECORDER IMPLANT;  Surgeon: JCoralyn Mark MD;  [facility];  Service: Cardiovascular;  Laterality: N/A;   LOOP RECORDER REMOVAL N/A 02/01/2018   Procedure: LOOP RECORDER REMOVAL;  Surgeon: KDeboraha Sprang MD;  Location: [place] [facility] LAB;  Service: Cardiovascular;  Laterality: N/A;   OVARIAN CYST REMOVAL     TEE WITHOUT CARDIOVERSION N/A 01/25/2013   Procedure: TRANSESOPHAGEAL ECHOCARDIOGRAM (TEE);  Surgeon: Birdie Riddle, MD;  Location: [facility] ENDOSCOPY;   Service: Cardiovascular;  Laterality: N/A;    Current Medications: Current Meds  Medication Sig   amLODipine (NORVASC) 10 MG tablet Take 1 tablet (10 mg total) by mouth daily.   Apremilast (OTEZLA) 30 MG TABS Take 1 tablet (30 mg total) by mouth 2 (two) times daily.   atorvastatin (LIPITOR) 40 MG tablet Take 1 tablet (40 mg total) by mouth daily.   cyanocobalamin 1000 MCG tablet Take 1,000 mcg by mouth daily.   fluconazole (DIFLUCAN) 200 MG tablet Take one tab po QW for two weeks.   fluticasone (FLONASE) 50 MCG/ACT nasal spray Place 2 sprays into both nostrils daily.   Iron, Ferrous Sulfate, 325 (65 Fe) MG TABS Take 325 mg by mouth daily.   metoprolol tartrate (LOPRESSOR) 25 MG tablet Take 1 tablet (25 mg total) by mouth 2 (two) times daily.   mometasone (ELOCON) 0.1 % cream APPLY TOPICALLY DAILY AS NEEDED FOR RASH. APPLY UP TO FIVE DAYS PER WEEK TO THE AFFECTED AREA AS NEEDED UNTIL IMPROVED. THEN DISCONTINUE   Na Sulfate-K Sulfate-Mg Sulf 17.5-3.13-1.6 GM/177ML SOLN    NON FORMULARY [employer] BRAND: STOOL SOFTNERS   Vitamin D, Ergocalciferol, (DRISDOL) 1.25 MG (50000 UNIT) CAPS capsule Take 1,250 Units by mouth every 7 (seven) days.   warfarin (COUMADIN) 5 MG tablet TAKE 1/2 TO 1 TABLET BY MOUTH ONCE DAILY OR AS DIRECTED     Allergies:   Patient has no known allergies.   Social History   Socioeconomic History   Marital status: Married    Spouse name: Not on file   Number of children: 2   Years of education: 13   Highest education level: Not on file  Occupational History   Not on file  Tobacco Use   Smoking status: Former    Packs/day: 1.00    Years: 10.00    Total pack years: 10.00    Types: Cigarettes    Quit date: 07/14/2006    Years since quitting: 16.4   Smokeless tobacco: Never   Tobacco comments:    quit smoking around 2007  Vaping Use   Vaping Use: Never used  Substance and Sexual Activity   Alcohol use: No    Alcohol/week: 0.0 standard drinks of alcohol   Drug  use: No   Sexual activity: Yes  Other Topics Concern   Not on file  Social History Narrative   ** Merged History Encounter **       Patient is single with two children. Patient is right handed. Patient has 13 yrs of education. Patient drinks 5 sodas daily.   Social Determinants of Health   Financial Resource Strain: Not on file  Food Insecurity: Not on file  Transportation Needs: Not on file  Physical Activity: Not on file  Stress: Not on file  Social Connections: Not on file     Family History: The patient's family history includes Diabetes in her mother, sister, and another family member; Diabetes Mellitus II in her mother; Stroke in her father, mother, and another family member; Transient ischemic attack in her father.  ROS:   Please see the history of present illness.     All other systems reviewed and are negative.  EKGs/Labs/Other Studies Reviewed:    The following studies were reviewed  today:  Echo 12/2019  1. Left ventricular ejection fraction, by visual estimation, is 60 to  65%. The left ventricle has normal function. There is no left ventricular  hypertrophy.   2. The left ventricle has no regional wall motion abnormalities.   3. Global right ventricle has normal systolic function.The right  ventricular size is normal. No increase in right ventricular wall  thickness.   4. Left atrial size was normal.   5. TR signal is inadequate for assessing pulmonary artery systolic  pressure.   Cardiac CTA 09/2019  IMPRESSION: 1. Coronary calcium score of 0. This was 0 percentile for age and sex matched control.   2. Normal coronary origin with right dominance.   3. The study quality is affected by motion, however there is no evidence of CAD. CAD-RADS 0. Consider non-atherosclerotic causes of chest pain.   4. Dilated pulmonary artery measuring 34 mm suggestive of pulmonary hypertension.   Electronically Signed: By: Ena Dawley On: 10/02/2019 14:03      EKG:  EKG is ordered today.  The ekg ordered today demonstrates NSR 67bpm, LAD, TW flattening III  Recent Labs: 04/27/2022: Hemoglobin 10.8; Platelets 254  Recent Lipid Panel    Component Value Date/Time   CHOL 129 04/01/2021 1036   TRIG 121 04/01/2021 1036   HDL 37 (L) 04/01/2021 1036   CHOLHDL 3.5 04/01/2021 1036   CHOLHDL 2.1 09/17/2019 0856   VLDL 22 09/17/2019 0856   LDLCALC 70 04/01/2021 1036    Physical Exam:    VS:  BP 122/72 (BP Location: Left Arm, Patient Position: Sitting, Cuff Size: Large)   Pulse 67   Ht '5\' 5"'$  (1.651 m)   Wt 229 lb 2.2 oz (103.9 kg)   LMP 04/04/2019 (Approximate)   SpO2 98%   BMI 38.13 kg/m     Wt Readings from Last 3 Encounters:  12/27/22 229 lb 2.2 oz (103.9 kg)  05/06/22 238 lb 12.8 oz (108.3 kg)  04/27/22 237 lb 2 oz (107.6 kg)     GEN:  Well nourished, well developed in no acute distress HEENT: Normal NECK: No JVD; No carotid bruits LYMPHATICS: No lymphadenopathy CARDIAC: RRR, no murmurs, rubs, gallops RESPIRATORY:  Clear to auscultation without rales, wheezing or rhonchi  ABDOMEN: Soft, non-tender, non-distended MUSCULOSKELETAL:  No edema; No deformity  SKIN: Warm and dry NEUROLOGIC:  Alert and oriented x 3 PSYCHIATRIC:  Normal affect   ASSESSMENT:    1. Hyperlipidemia LDL goal <70   2. Essential hypertension   3. Cryptogenic stroke ([place])   4. Sleep apnea, unspecified type   5. Antiphospholipid syndrome (HCC)    PLAN:    In order of problems listed above:  HTN BP is good today, continue amlodipine '10mg'$  daily and metoprolol. She is only taking Lopressor once day, so we will change this to Toprol '25mg'$  daily.   H/o cryptogenic stroke With previous ILR removed in 2019. No new symptoms. Continue risk factor management.   Phospholipid syndrome The patient is on coumadin, followed by the coumadin clinic. Follow up as directed.  OSA Patient is not interested in a CPAP machine.  Disposition: Follow up in 1 year(s)  with MD/APP    Signed, Tate Zagal Ninfa Meeker, PA-C  12/27/2022 1:42 PM    [facility]

## 2022-12-29 ENCOUNTER — Encounter: Payer: Self-pay | Admitting: Oncology

## 2023-01-04 ENCOUNTER — Ambulatory Visit: Payer: Medicare Other

## 2023-01-18 ENCOUNTER — Ambulatory Visit: Payer: Medicare Other | Attending: Cardiovascular Disease

## 2023-01-18 DIAGNOSIS — Z8673 Personal history of transient ischemic attack (TIA), and cerebral infarction without residual deficits: Secondary | ICD-10-CM | POA: Diagnosis not present

## 2023-01-18 DIAGNOSIS — I639 Cerebral infarction, unspecified: Secondary | ICD-10-CM | POA: Diagnosis not present

## 2023-01-18 DIAGNOSIS — Z5181 Encounter for therapeutic drug level monitoring: Secondary | ICD-10-CM

## 2023-01-18 LAB — POCT INR: INR: 3.2 — AB (ref 2.0–3.0)

## 2023-01-18 MED ORDER — WARFARIN SODIUM 5 MG PO TABS: ORAL_TABLET | ORAL | 1 refills | Status: DC

## 2023-01-18 NOTE — Patient Instructions (Signed)
Continue 1 tablet daily except 1/2 tablet on Mondays, Wednesdays and Fridays.  Recheck INR in 6 weeks.

## 2023-02-08 ENCOUNTER — Telehealth: Payer: Self-pay

## 2023-02-08 NOTE — Telephone Encounter (Signed)
Patient picked up samples of Otezla.  [id number] EXP: 08/18/2024  Patient also completed [employer]/Otezla patient assistance paperwork.   Johnsie Kindred, RMA

## 2023-02-15 DIAGNOSIS — R42 Dizziness and giddiness: Secondary | ICD-10-CM | POA: Diagnosis not present

## 2023-02-22 ENCOUNTER — Encounter: Payer: Self-pay | Admitting: Physician Assistant

## 2023-02-22 ENCOUNTER — Ambulatory Visit: Payer: Medicare Other | Admitting: Physician Assistant

## 2023-02-22 VITALS — BP 105/61 | HR 69 | Ht 65.0 in | Wt 231.0 lb

## 2023-02-22 DIAGNOSIS — E559 Vitamin D deficiency, unspecified: Secondary | ICD-10-CM

## 2023-02-22 DIAGNOSIS — F3289 Other specified depressive episodes: Secondary | ICD-10-CM | POA: Diagnosis not present

## 2023-02-22 DIAGNOSIS — D508 Other iron deficiency anemias: Secondary | ICD-10-CM

## 2023-02-22 DIAGNOSIS — Z8673 Personal history of transient ischemic attack (TIA), and cerebral infarction without residual deficits: Secondary | ICD-10-CM | POA: Diagnosis not present

## 2023-02-22 DIAGNOSIS — I10 Essential (primary) hypertension: Secondary | ICD-10-CM

## 2023-02-22 DIAGNOSIS — R42 Dizziness and giddiness: Secondary | ICD-10-CM

## 2023-02-22 DIAGNOSIS — R7303 Prediabetes: Secondary | ICD-10-CM

## 2023-02-22 NOTE — Progress Notes (Unsigned)
Established Patient Office Visit  Subjective   Patient ID: Mary [name], female    DOB: 10-Aug-1966  Age: 57 y.o. MRN: [id number]  Chief Complaint  Patient presents with   Dizziness    Diagnosed with Virtigo, by Dr. Fulton Mole. Patient was Prescribed a Medication by walk in clinic in [place]. She states she can't find medication     States that she was seen in an urgent care walk in clinic on 02/15/23 for dizziness.  Note from that visit:    History of Present Illness:  Mary [name] is a 57 y.o. female here for dizziness. She states this has happened over the last few days. It occurs at random. It would last for minutes at a time. She denies any headache or vision changes with the episodes. She denies any new triggers. She denies any new change in medications or diet. She has not taken some over-the-counter medication such as B12 and vitamin D. She never had vertigo before. She does have a history of stroke. However, she denies any change in mentation, ability to speak, or weakness in any extremity. She is otherwise eating and drinking well. She is swallowing fine. She feels like she is unsteady on her feet. She does not like the environment is moving around her. She denies any nausea or vomiting. She denies any seasonal allergies. Diagnoses and all orders for this visit:  Vertigo  Other orders - meclizine (ANTIVERT) 25 mg tablet; Take 1 tablet (25 mg total) by mouth 3 (three) times daily as needed for Dizziness for up to 10 days - loratadine (CLARITIN) 10 mg tablet; Take 1 tablet (10 mg total) by mouth once daily for 14 days  Patient Instructions As we discussed, this seems to be vertigo to me. This is an abnormal sensation of movement in the brain. Take the medications as directed. Should symptoms worsen, such as strokelike symptoms, such as vision changes, change in thought processes, difficulty speaking, numbness, ting, weakness, or chest pain, please present immediately to the  nearest emergency department, or call 911. If symptoms do not improve, but do not worsen, please follow-up with your primary care provider for further evaluation.   States today that she did not start the meclizine as prescribed.  States that she continues to have dizziness that is intermittent, only last a few moments, occurs with movement.  States that when she is sitting she does not have any episodes of dizziness.  Denies any recent medication changes has been on the same dose of amlodipine for the past 4 years.  States that she has been checking her blood pressure at home, states that her readings are generally within normal limits, states earlier today was 135/88.  Does have history of iron deficiency anemia, on review of medical records her last office visit regarding this was in May 2023 and was scheduled to have iron infusions done, states that she did not have this completed and unfortunately has not followed up for this.  Does endorse that she did start taking over-the-counter iron approximately once daily for the past couple of months.  States that she is drinking approximately 4 bottles of water a day, states she does have increased stressors, states that she works Health and safety inspector at [employer] and her disability was recently discontinued due to her working part-time.  States that she is worried about how she will pay her bills.  States that because of her shift work she has difficulty sleeping during the day, states that she  is sleeping approximately 5 hours a night.  States that she has had thoughts that she would be better off dead, mostly due to her history of stroke and now difficulty with finances.  Adamantly denies any thoughts of self-harm.     Past Medical History:  Diagnosis Date   Blood transfusion without reported diagnosis    transfusion December 2016   Clotting disorder ([place])    left leg   Diabetes mellitus without complication ([place])    Hypertension    Other and  unspecified ovarian cysts    Stroke ([place])    Vaginal Pap smear, abnormal    Social History   Socioeconomic History   Marital status: Married    Spouse name: Not on file   Number of children: 2   Years of education: 13   Highest education level: Not on file  Occupational History   Not on file  Tobacco Use   Smoking status: Former    Packs/day: 1.00    Years: 10.00    Total pack years: 10.00    Types: Cigarettes    Quit date: 07/14/2006    Years since quitting: 16.6   Smokeless tobacco: Never   Tobacco comments:    quit smoking around 2007  Vaping Use   Vaping Use: Never used  Substance and Sexual Activity   Alcohol use: No    Alcohol/week: 0.0 standard drinks of alcohol   Drug use: No   Sexual activity: Yes  Other Topics Concern   Not on file  Social History Narrative   ** Merged History Encounter **       Patient is single with two children. Patient is right handed. Patient has 13 yrs of education. Patient drinks 5 sodas daily.   Social Determinants of Health   Financial Resource Strain: Not on file  Food Insecurity: Not on file  Transportation Needs: Not on file  Physical Activity: Not on file  Stress: Not on file  Social Connections: Not on file  Intimate Partner Violence: Not on file   Family History  Problem Relation Age of Onset   Diabetes Mellitus II Mother    Diabetes Mother    Stroke Mother    Transient ischemic attack Father    Stroke Father    Stroke Other    Diabetes Other    Diabetes Sister    No Known Allergies  Review of Systems  Constitutional: Negative.   HENT: Negative.    Eyes: Negative.   Respiratory:  Negative for shortness of breath.   Cardiovascular:  Negative for chest pain.  Gastrointestinal: Negative.   Genitourinary: Negative.   Musculoskeletal: Negative.   Skin: Negative.   Neurological:  Positive for dizziness. Negative for weakness and headaches.  Endo/Heme/Allergies: Negative.   Psychiatric/Behavioral:  Positive  for depression. The patient is nervous/anxious and has insomnia.       Objective:     BP 105/61 (BP Location: Left Arm, Patient Position: Sitting, Cuff Size: Large)   Pulse 69   Ht '5\' 5"'$  (1.651 m)   Wt 231 lb (104.8 kg)   LMP 04/04/2019 (Approximate)   BMI 38.44 kg/m  BP Readings from Last 3 Encounters:  02/22/23 105/61  12/27/22 122/72  05/06/22 126/82   Wt Readings from Last 3 Encounters:  02/22/23 231 lb (104.8 kg)  12/27/22 229 lb 2.2 oz (103.9 kg)  05/06/22 238 lb 12.8 oz (108.3 kg)      Physical Exam Vitals and nursing note reviewed.  Constitutional:  General: She is not in acute distress.    Appearance: Normal appearance. She is obese.  HENT:     Head: Normocephalic and atraumatic.     Right Ear: Tympanic membrane, ear canal and external ear normal.     Left Ear: Tympanic membrane, ear canal and external ear normal.     Nose: Nose normal.     Mouth/Throat:     Mouth: Mucous membranes are moist.     Pharynx: Oropharynx is clear.  Eyes:     Extraocular Movements: Extraocular movements intact.     Conjunctiva/sclera: Conjunctivae normal.     Pupils: Pupils are equal, round, and reactive to light.  Cardiovascular:     Rate and Rhythm: Normal rate and regular rhythm.     Pulses: Normal pulses.     Heart sounds: Normal heart sounds.  Pulmonary:     Effort: Pulmonary effort is normal.     Breath sounds: Normal breath sounds.  Musculoskeletal:        General: Normal range of motion.     Cervical back: Normal range of motion and neck supple.  Skin:    General: Skin is warm and dry.  Neurological:     General: No focal deficit present.     Mental Status: She is alert and oriented to person, place, and time.  Psychiatric:        Mood and Affect: Mood normal.        Behavior: Behavior normal.        Thought Content: Thought content normal.        Judgment: Judgment normal.        Assessment & Plan:   Problem List Items Addressed This Visit        Cardiovascular and Mediastinum   Essential hypertension   Relevant Orders   Comp. Metabolic Panel (12)     Other   History of stroke   Vitamin D deficiency   Relevant Orders   Vitamin D, 25-hydroxy   Iron deficiency anemia   Relevant Orders   CBC with Differential/Platelet   Iron, TIBC and Ferritin Panel   Other Visit Diagnoses     Dizziness and giddiness    -  Primary   Relevant Medications   meclizine (ANTIVERT) 25 MG tablet   Other Relevant Orders   Comp. Metabolic Panel (12)   TSH   Prediabetes       Relevant Orders   HgB A1c   Other depression       Relevant Orders   Ambulatory referral to Social Work      1. Dizziness and giddiness Patient encouraged to continue with trial of meclizine, however dizziness may be attributed to episodes of hypotension, or anemia.  Labs completed today.  Patient education given on supportive care, red flags given for prompt reevaluation - Comp. Metabolic Panel (12) - TSH  2. Other depression Patient agreeable to referral for CBT.  Patient education given on coping skills - Ambulatory referral to Social Work  3. Other iron deficiency anemia  - CBC with Differential/Platelet - Iron, TIBC and Ferritin Panel  4. History of stroke   5. Prediabetes  - HgB A1c  6. Essential hypertension Encouraged patient to continue checking blood pressure at home, encouraged to check blood pressure during episodes of dizziness - Comp. Metabolic Panel (12)  7. Vitamin D deficiency  - Vitamin D, 25-hydroxy   I have reviewed the patient's medical history (PMH, PSH, Social History, Family History, Medications, and allergies) ,  and have been updated if relevant. I spent 30 minutes reviewing chart and  face to face time with patient.    Return if symptoms worsen or fail to improve.    Loraine Grip Mayers, PA-C

## 2023-02-22 NOTE — Progress Notes (Deleted)
Patient ID: Mary [name], female   DOB: [birth year], 57 y.o.   MRN: [id number]   Seen at [facility] for vertigo on 02/15/2023.  Diagnoses and all orders for this visit:  Vertigo  Other orders - meclizine (ANTIVERT) 25 mg tablet; Take 1 tablet (25 mg total) by mouth 3 (three) times daily as needed for Dizziness for up to 10 days - loratadine (CLARITIN) 10 mg tablet; Take 1 tablet (10 mg total) by mouth once daily for 14 days  Patient Instructions As we discussed, this seems to be vertigo to me. This is an abnormal sensation of movement in the brain. Take the medications as directed. Should symptoms worsen, such as strokelike symptoms, such as vision changes, change in thought processes, difficulty speaking, numbness, ting, weakness, or chest pain, please present immediately to the nearest emergency department, or call 911. If symptoms do not improve, but do not worsen, please follow-up with your primary care provider for further evaluation.

## 2023-02-22 NOTE — Patient Instructions (Addendum)
I encourage you to continue checking your blood pressure on a daily basis, keep a written log and have available for all office visits.  Your blood pressure today is on the lower end of normal.  This can be adding to your dizziness.  If you are able to check your blood pressure during episodes of dizziness please do that as well.  I encourage you to call legal aid of [place] to help you with your disability issues.  I have started a referral for you to be [facility], they will reach out to you to schedule that appointment.  We will call you with today's lab results.   Kennieth Rad, PA-C Physician Assistant [facility] [email or web address]    Dizziness Dizziness is a common problem. It is a feeling of unsteadiness or light-headedness. You may feel like you are about to faint. Dizziness can lead to injury if you stumble or fall. Anyone can become dizzy, but dizziness is more common in older adults. This condition can be caused by a number of things, including medicines, dehydration, or illness. Follow these instructions at home: Eating and drinking  Drink enough fluid to keep your urine pale yellow. This helps to keep you from becoming dehydrated. Try to drink more clear fluids, such as water. Do not drink alcohol. Limit your caffeine intake if told to do so by your health care provider. Check ingredients and nutrition facts to see if a food or beverage contains caffeine. Limit your salt (sodium) intake if told to do so by your health care provider. Check ingredients and nutrition facts to see if a food or beverage contains sodium. Activity  Avoid making quick movements. Rise slowly from chairs and steady yourself until you feel okay. In the morning, first sit up on the side of the bed. When you feel okay, stand slowly while you hold onto something until you know that your  balance is good. If you need to stand in one place for a long time, move your legs often. Tighten and relax the muscles in your legs while you are standing. Do not drive or use machinery if you feel dizzy. Avoid bending down if you feel dizzy. Place items in your home so that they are easy for you to reach without leaning over. Lifestyle Do not use any products that contain nicotine or tobacco. These products include cigarettes, chewing tobacco, and vaping devices, such as e-cigarettes. If you need help quitting, ask your health care provider. Try to reduce your stress level by using methods such as yoga or meditation. Talk with your health care provider if you need help to manage your stress. General instructions Watch your dizziness for any changes. Take over-the-counter and prescription medicines only as told by your health care provider. Talk with your health care provider if you think that your dizziness is caused by a medicine that you are taking. Tell a friend or a family member that you are feeling dizzy. If he or she notices any changes in your behavior, have this person call your health care provider. Keep all follow-up visits. This is important. Contact a health care provider if: Your dizziness does not go away or you have new symptoms. Your dizziness or light-headedness gets worse. You feel nauseous. You have reduced hearing. You have a fever. You have neck pain or a stiff neck. Your dizziness leads to an injury or a fall. Get help right away if: You  vomit or have diarrhea and are unable to eat or drink anything. You have problems talking, walking, swallowing, or using your arms, hands, or legs. You feel generally weak. You have any bleeding. You are not thinking clearly or you have trouble forming sentences. It may take a friend or family member to notice this. You have chest pain, abdominal pain, shortness of breath, or sweating. Your vision changes or you develop a severe  headache. These symptoms may represent a serious problem that is an emergency. Do not wait to see if the symptoms will go away. Get medical help right away. Call your local emergency services (911 in the [place] Do not drive yourself to the hospital. Summary Dizziness is a feeling of unsteadiness or light-headedness. This condition can be caused by a number of things, including medicines, dehydration, or illness. Anyone can become dizzy, but dizziness is more common in older adults. Drink enough fluid to keep your urine pale yellow. Do not drink alcohol. Avoid making quick movements if you feel dizzy. Monitor your dizziness for any changes. This information is not intended to replace advice given to you by your health care provider. Make sure you discuss any questions you have with your health care provider. Document Revised: 11/09/2020 Document Reviewed: 11/09/2020 Elsevier Patient Education  [place].

## 2023-02-23 ENCOUNTER — Ambulatory Visit (INDEPENDENT_AMBULATORY_CARE_PROVIDER_SITE_OTHER): Payer: Medicare Other | Admitting: Dermatology

## 2023-02-23 VITALS — BP 123/73 | HR 70

## 2023-02-23 DIAGNOSIS — L409 Psoriasis, unspecified: Secondary | ICD-10-CM | POA: Diagnosis not present

## 2023-02-23 DIAGNOSIS — Z79899 Other long term (current) drug therapy: Secondary | ICD-10-CM

## 2023-02-23 DIAGNOSIS — R7303 Prediabetes: Secondary | ICD-10-CM | POA: Insufficient documentation

## 2023-02-23 LAB — CBC WITH DIFFERENTIAL/PLATELET
Basophils Absolute: 0 10*3/uL (ref 0.0–0.2)
Basos: 1 %
EOS (ABSOLUTE): 0.3 10*3/uL (ref 0.0–0.4)
Eos: 6 %
Hematocrit: 35.4 % (ref 34.0–46.6)
Hemoglobin: 11.3 g/dL (ref 11.1–15.9)
Immature Grans (Abs): 0 10*3/uL (ref 0.0–0.1)
Immature Granulocytes: 0 %
Lymphocytes Absolute: 1.5 10*3/uL (ref 0.7–3.1)
Lymphs: 31 %
MCH: 22.9 pg — ABNORMAL LOW (ref 26.6–33.0)
MCHC: 31.9 g/dL (ref 31.5–35.7)
MCV: 72 fL — ABNORMAL LOW (ref 79–97)
Monocytes Absolute: 0.3 10*3/uL (ref 0.1–0.9)
Monocytes: 6 %
Neutrophils Absolute: 2.8 10*3/uL (ref 1.4–7.0)
Neutrophils: 56 %
Platelets: 280 10*3/uL (ref 150–450)
RBC: 4.94 x10E6/uL (ref 3.77–5.28)
RDW: 14.8 % (ref 11.7–15.4)
WBC: 4.9 10*3/uL (ref 3.4–10.8)

## 2023-02-23 LAB — COMP. METABOLIC PANEL (12)
AST: 19 IU/L (ref 0–40)
Albumin/Globulin Ratio: 1.1 — ABNORMAL LOW (ref 1.2–2.2)
Albumin: 3.9 g/dL (ref 3.8–4.9)
Alkaline Phosphatase: 110 IU/L (ref 44–121)
BUN/Creatinine Ratio: 9 (ref 9–23)
BUN: 10 mg/dL (ref 6–24)
Bilirubin Total: 0.3 mg/dL (ref 0.0–1.2)
Calcium: 8.7 mg/dL (ref 8.7–10.2)
Chloride: 105 mmol/L (ref 96–106)
Creatinine, Ser: 1.06 mg/dL — ABNORMAL HIGH (ref 0.57–1.00)
Globulin, Total: 3.4 g/dL (ref 1.5–4.5)
Glucose: 141 mg/dL — ABNORMAL HIGH (ref 70–99)
Potassium: 3.6 mmol/L (ref 3.5–5.2)
Sodium: 143 mmol/L (ref 134–144)
Total Protein: 7.3 g/dL (ref 6.0–8.5)
eGFR: 61 mL/min/{1.73_m2} (ref >59)

## 2023-02-23 LAB — IRON,TIBC AND FERRITIN PANEL
Ferritin: 28 ng/mL (ref 15–150)
Iron Saturation: 9 % — CL (ref 15–55)
Iron: 30 ug/dL (ref 27–159)
Total Iron Binding Capacity: 336 ug/dL (ref 250–450)
UIBC: 306 ug/dL (ref 131–425)

## 2023-02-23 LAB — VITAMIN D 25 HYDROXY (VIT D DEFICIENCY, FRACTURES): Vit D, 25-Hydroxy: 24.9 ng/mL — ABNORMAL LOW (ref 30.0–100.0)

## 2023-02-23 LAB — TSH: TSH: 1.19 u[IU]/mL (ref 0.450–4.500)

## 2023-02-23 NOTE — Patient Instructions (Signed)
Due to recent changes in healthcare laws, you may see results of your pathology and/or laboratory studies on MyChart before the doctors have had a chance to review them. We understand that in some cases there may be results that are confusing or concerning to you. Please understand that not all results are received at the same time and often the doctors may need to interpret multiple results in order to provide you with the best plan of care or course of treatment. Therefore, we ask that you please give us 2 business days to thoroughly review all your results before contacting the office for clarification. Should we see a critical lab result, you will be contacted sooner.   If You Need Anything After Your Visit  If you have any questions or concerns for your doctor, please call our main line at [phone] and press option 4 to reach your doctor's medical assistant. If no one answers, please leave a voicemail as directed and we will return your call as soon as possible. Messages left after 4 pm will be answered the following business day.   You may also send us a message via MyChart. We typically respond to MyChart messages within 1-2 business days.  For prescription refills, please ask your pharmacy to contact our office. Our fax number is [phone].  If you have an urgent issue when the clinic is closed that cannot wait until the next business day, you can page your doctor at the number below.    Please note that while we do our best to be available for urgent issues outside of office hours, we are not available 24/7.   If you have an urgent issue and are unable to reach us, you may choose to seek medical care at your doctor's office, retail clinic, urgent care center, or emergency room.  If you have a medical emergency, please immediately call 911 or go to the emergency department.  Pager Numbers  - Dr. Kowalski: [phone]  - Dr. Moye: [phone]  - Dr. Stewart:  [phone]  In the event of inclement weather, please call our main line at [phone] for an update on the status of any delays or closures.  Dermatology Medication Tips: Please keep the boxes that topical medications come in in order to help keep track of the instructions about where and how to use these. Pharmacies typically print the medication instructions only on the boxes and not directly on the medication tubes.   If your medication is too expensive, please contact our office at [phone] option 4 or send us a message through MyChart.   We are unable to tell what your co-pay for medications will be in advance as this is different depending on your insurance coverage. However, we may be able to find a substitute medication at lower cost or fill out paperwork to get insurance to cover a needed medication.   If a prior authorization is required to get your medication covered by your insurance company, please allow us 1-2 business days to complete this process.  Drug prices often vary depending on where the prescription is filled and some pharmacies may offer cheaper prices.  The website www.goodrx.com contains coupons for medications through different pharmacies. The prices here do not account for what the cost may be with help from insurance (it may be cheaper with your insurance), but the website can give you the price if you did not use any insurance.  - You can print the associated coupon and take it with   your prescription to the pharmacy.  - You may also stop by our office during regular business hours and pick up a GoodRx coupon card.  - If you need your prescription sent electronically to a different pharmacy, notify our office through [place] MyChart or by phone at [phone] option 4.     Si Usted Necesita Algo Despus de Su Visita  Tambin puede enviarnos un mensaje a travs de MyChart. Por lo general respondemos a los mensajes de MyChart en el transcurso de 1 a 2  das hbiles.  Para renovar recetas, por favor pida a su farmacia que se ponga en contacto con nuestra oficina. Nuestro nmero de fax es el [phone].  Si tiene un asunto urgente cuando la clnica est cerrada y que no puede esperar hasta el siguiente da hbil, puede llamar/localizar a su doctor(a) al nmero que aparece a continuacin.   Por favor, tenga en cuenta que aunque hacemos todo lo posible para estar disponibles para asuntos urgentes fuera del horario de oficina, no estamos disponibles las 24 horas del da, los 7 das de la semana.   Si tiene un problema urgente y no puede comunicarse con nosotros, puede optar por buscar atencin mdica  en el consultorio de su doctor(a), en una clnica privada, en un centro de atencin urgente o en una sala de emergencias.  Si tiene una emergencia mdica, por favor llame inmediatamente al 911 o vaya a la sala de emergencias.  Nmeros de bper  - Dr. Kowalski: [phone]  - Dra. Moye: [phone]  - Dra. Stewart: [phone]  En caso de inclemencias del tiempo, por favor llame a nuestra lnea principal al [phone] para una actualizacin sobre el estado de cualquier retraso o cierre.  Consejos para la medicacin en dermatologa: Por favor, guarde las cajas en las que vienen los medicamentos de uso tpico para ayudarle a seguir las instrucciones sobre dnde y cmo usarlos. Las farmacias generalmente imprimen las instrucciones del medicamento slo en las cajas y no directamente en los tubos del medicamento.   Si su medicamento es muy caro, por favor, pngase en contacto con nuestra oficina llamando al [phone] y presione la opcin 4 o envenos un mensaje a travs de MyChart.   No podemos decirle cul ser su copago por los medicamentos por adelantado ya que esto es diferente dependiendo de la cobertura de su seguro. Sin embargo, es posible que podamos encontrar un medicamento sustituto a menor costo o llenar un formulario para que el  seguro cubra el medicamento que se considera necesario.   Si se requiere una autorizacin previa para que su compaa de seguros cubra su medicamento, por favor permtanos de 1 a 2 das hbiles para completar este proceso.  Los precios de los medicamentos varan con frecuencia dependiendo del lugar de dnde se surte la receta y alguna farmacias pueden ofrecer precios ms baratos.  El sitio web www.goodrx.com tiene cupones para medicamentos de diferentes farmacias. Los precios aqu no tienen en cuenta lo que podra costar con la ayuda del seguro (puede ser ms barato con su seguro), pero el sitio web puede darle el precio si no utiliz ningn seguro.  - Puede imprimir el cupn correspondiente y llevarlo con su receta a la farmacia.  - Tambin puede pasar por nuestra oficina durante el horario de atencin regular y recoger una tarjeta de cupones de GoodRx.  - Si necesita que su receta se enve electrnicamente a una farmacia diferente, informe a nuestra oficina a travs de MyChart de [place]   o por telfono llamando al [phone] y presione la opcin 4.  

## 2023-02-23 NOTE — Progress Notes (Signed)
   Follow-Up Visit   Subjective  Mary [name] is a 57 y.o. female who presents for the following: Psoriasis.  The following portions of the chart were reviewed this encounter and updated as appropriate:   Tobacco  Allergies  Meds  Problems  Med Hx  Surg Hx  Fam Hx     Review of Systems:  No other skin or systemic complaints except as noted in HPI or Assessment and Plan.  Objective  Well appearing patient in no apparent distress; mood and affect are within normal limits.  A focused examination was performed including the face, trunk, and extremities. Relevant physical exam findings are noted in the Assessment and Plan.   Assessment & Plan  Psoriasis Inframammary, abdomen, breast, neck Chronic and persistent condition with duration or expected duration over one year. Condition is symptomatic / bothersome to patient. Not to goal, much better controlled on oral Otezla.  She could not tolerate a higher dose but is doing well on 1 a day.  Counseling on psoriasis and coordination of care  psoriasis is a chronic non-curable, but treatable genetic/hereditary disease that may have other systemic features affecting other organ systems such as joints (Psoriatic Arthritis). It is associated with an increased risk of inflammatory bowel disease, heart disease, non-alcoholic fatty liver disease, and depression.  Treatments include light and laser treatments; topical medications; and systemic medications including oral and injectables.  Continue Otezla 30 mg daily Decrease Triamcinolone cream apply to affected skin 3 nights a week   Side effects of Otezla (apremilast) include diarrhea, nausea, headache, upper respiratory infection, depression, and weight decrease (5-10%). It should only be taken by pregnant women after a discussion regarding risks and benefits with their doctor. Goal is control of skin condition, not cure.  The use of [name] requires long term medication management, including  periodic office visits.  Topical steroids (such as triamcinolone, fluocinolone, fluocinonide, mometasone, clobetasol, halobetasol, betamethasone, hydrocortisone) can cause thinning and lightening of the skin if they are used for too long in the same area. Your physician has selected the right strength medicine for your problem and area affected on the body. Please use your medication only as directed by your physician to prevent side effects.   Related Medication Apremilast (OTEZLA) 30 MG TABS Take 1 tablet (30 mg total) by mouth 2 (two) times daily.  Return in about 4 months (around 06/25/2023) for psoriasis/Otezla .  Luther Redo, CMA, am acting as scribe for Sarina Ser, MD . Documentation: I have reviewed the above documentation for accuracy and completeness, and I agree with the above.  Sarina Ser, MD

## 2023-02-25 ENCOUNTER — Encounter: Payer: Self-pay | Admitting: Dermatology

## 2023-02-27 ENCOUNTER — Ambulatory Visit: Payer: Medicare Other | Admitting: Physician Assistant

## 2023-03-01 ENCOUNTER — Ambulatory Visit: Payer: Medicare Other | Attending: Cardiovascular Disease

## 2023-03-01 DIAGNOSIS — Z8673 Personal history of transient ischemic attack (TIA), and cerebral infarction without residual deficits: Secondary | ICD-10-CM | POA: Diagnosis not present

## 2023-03-01 DIAGNOSIS — I639 Cerebral infarction, unspecified: Secondary | ICD-10-CM

## 2023-03-01 DIAGNOSIS — Z5181 Encounter for therapeutic drug level monitoring: Secondary | ICD-10-CM

## 2023-03-01 LAB — POCT INR: INR: 4.4 — AB (ref 2.0–3.0)

## 2023-03-01 NOTE — Patient Instructions (Signed)
Description   Skip today's dosage of Warfarin, then resume same dosage of Warfarin 1 tablet daily except 1/2 tablet on Mondays, Wednesdays and Fridays.  Recheck INR in 3 weeks.

## 2023-03-03 LAB — SPECIMEN STATUS REPORT

## 2023-03-03 LAB — HGB A1C W/O EAG: Hgb A1c MFr Bld: 6.4 % — ABNORMAL HIGH (ref 4.8–5.6)

## 2023-03-09 ENCOUNTER — Encounter: Payer: Self-pay | Admitting: Physician Assistant

## 2023-03-09 ENCOUNTER — Ambulatory Visit: Payer: Medicare Other | Attending: Physician Assistant | Admitting: Physician Assistant

## 2023-03-09 VITALS — BP 120/73 | HR 61 | Wt 236.4 lb

## 2023-03-09 DIAGNOSIS — E1159 Type 2 diabetes mellitus with other circulatory complications: Secondary | ICD-10-CM | POA: Diagnosis not present

## 2023-03-09 DIAGNOSIS — H6120 Impacted cerumen, unspecified ear: Secondary | ICD-10-CM

## 2023-03-09 DIAGNOSIS — R42 Dizziness and giddiness: Secondary | ICD-10-CM

## 2023-03-09 NOTE — Progress Notes (Signed)
Patient ID: Mary [name], female   DOB: 08/13/1966, 57 y.o.   MRN: [id number]   Mary [name], is a 57 y.o. female  [id number]  [id number]  DOB - 11-27-1966  Chief Complaint  Patient presents with   Dizziness       Subjective:   Mary [name] is a 57 y.o. female here today for a follow up visit after being diagnosed with vertigo and to discuss elevated A1C.  Meclizine helps with vertigo.  No vision changes.  Vertigo is intermittent and is worse with changing positions.  No recent illness. Nos s/sx stroke/paralysis/weakness. Slight congestion.  Wears R ear piece for work/phone.    No problems updated.  ALLERGIES: No Known Allergies  PAST MEDICAL HISTORY: Past Medical History:  Diagnosis Date   Blood transfusion without reported diagnosis    transfusion December 2016   Clotting disorder ([place])    left leg   Diabetes mellitus without complication ([place])    Hypertension    Other and unspecified ovarian cysts    Stroke ([place])    Vaginal Pap smear, abnormal     MEDICATIONS AT HOME: Prior to Admission medications   Medication Sig Start Date End Date Taking? Authorizing Provider  amLODipine (NORVASC) 10 MG tablet Take 1 tablet (10 mg total) by mouth daily. 12/27/22  Yes Furth, Cadence H, PA-C  Apremilast (OTEZLA) 30 MG TABS Take 1 tablet (30 mg total) by mouth 2 (two) times daily. Patient taking differently: Take 1 tablet by mouth daily. 11/29/22  Yes Ralene Bathe, MD  atorvastatin (LIPITOR) 40 MG tablet Take 1 tablet (40 mg total) by mouth daily. 12/21/21  Yes Minna Merritts, MD  Calcipotriene-Betameth Diprop [facility]) 0.005-0.064 % CREA Apply 1 application. topically as directed. Apply twice daily for two weeks then decrease once daily for 5 days. Avoid Face, groin and underarms. 03/03/22  Yes Ralene Bathe, MD  Iron, Ferrous Sulfate, 325 (65 Fe) MG TABS Take 325 mg by mouth daily. 04/12/21  Yes [name], MD  meclizine (ANTIVERT) 25 MG tablet Take 25 mg by  mouth 3 (three) times daily as needed.   Yes [provider]  metoprolol tartrate (LOPRESSOR) 25 MG tablet Take 1 tablet (25 mg total) by mouth 2 (two) times daily. 12/21/21  Yes Gollan, Kathlene November, MD  mometasone (ELOCON) 0.1 % cream APPLY TOPICALLY DAILY AS NEEDED FOR RASH. APPLY UP TO FIVE DAYS PER WEEK TO THE AFFECTED AREA AS NEEDED UNTIL IMPROVED. THEN DISCONTINUE 12/26/22  Yes Ralene Bathe, MD  warfarin (COUMADIN) 5 MG tablet TAKE 1/2 TO 1 TABLET BY MOUTH ONCE DAILY OR AS DIRECTED 01/18/23  Yes Gollan, Kathlene November, MD  cyanocobalamin 1000 MCG tablet Take 1,000 mcg by mouth daily. Patient not taking: Reported on 02/22/2023    [provider]  fluticasone (FLONASE) 50 MCG/ACT nasal spray Place 2 sprays into both nostrils daily. Patient not taking: Reported on 02/22/2023 04/26/18   Argentina Donovan, PA-C  Na Sulfate-K Sulfate-Mg Sulf 17.5-3.13-1.6 GM/177ML SOLN  01/20/22   [provider]  NON Tye Savoy BRANDCorene Cornea Patient not taking: Reported on 03/09/2023    [provider]  Vitamin D, Ergocalciferol, (DRISDOL) 1.25 MG (50000 UNIT) CAPS capsule Take 1,250 Units by mouth every 7 (seven) days. Patient not taking: Reported on 03/09/2023    [provider]    ROS: Neg HEENT Neg resp Neg cardiac Neg GI Neg GU Neg MS Neg psych   Objective:   Vitals:   03/09/23  1041  BP: 120/73  Pulse: 61  SpO2: 99%  Weight: 236 lb 6.4 oz (107.2 kg)   Exam General appearance : Awake, alert, not in any distress. Speech Clear. Not toxic looking HEENT: Atraumatic and Normocephalic.  PERRLA.  EOM intact. No nystagmus.  L canal with hard pieces wax that are partially obstructing TM.  R canal is tortuous and TM occluded by cerumen Neck: Supple, no JVD. No cervical lymphadenopathy.  Chest: Good air entry bilaterally, CTAB.  No rales/rhonchi/wheezing CVS: S1 S2 regular, no murmurs.  Extremities: B/L Lower Ext shows no edema, both legs are warm to  touch Neurology: Awake alert, and oriented X 3, CN II-XII intact, Non focal.   Skin: No Rash  Data Review Lab Results  Component Value Date   HGBA1C 6.4 (H) 02/22/2023   HGBA1C 6.1 10/18/2021   HGBA1C 5.9 (A) 03/02/2021    Assessment & Plan   1. Vertigo No central signs.  Increase water intake.   - Ambulatory referral to ENT  2. Impacted cerumen, unspecified laterality - Ambulatory referral to ENT  3. Type 2 diabetes mellitus with other circulatory complication, without long-term current use of insulin ([place]) She wants to work on lifestyle changes I have had a lengthy discussion and provided education about insulin resistance and the intake of too much sugar/refined carbohydrates.  I have advised the patient to work at a goal of eliminating sugary drinks, candy, desserts, sweets, refined sugars, processed foods, and white carbohydrates.  The patient expresses understanding.   Reviewed recent labs   Return in about 3 months (around 06/09/2023) for with Dr Margarita Rana for chronic conditions.  The patient was given clear instructions to go to ER or return to medical center if symptoms don't improve, worsen or new problems develop. The patient verbalized understanding. The patient was told to call to get lab results if they haven't heard anything in the next week.      Freeman Caldron, PA-C [facility] and [facility], [place]   03/09/2023, 4:06 PM

## 2023-03-09 NOTE — Patient Instructions (Addendum)
Drink 80-100 ounces water daily   Vertigo Vertigo is the feeling that you or your surroundings are moving when they are not. This feeling can come and go at any time. Vertigo often goes away on its own. Vertigo can be dangerous if it occurs while you are doing something that could endanger yourself or others, such as driving or operating machinery. Your health care provider will do tests to try to determine the cause of your vertigo. Tests will also help your health care provider decide how best to treat your condition. Follow these instructions at home: Eating and drinking     Dehydration can make vertigo worse. Drink enough fluid to keep your urine pale yellow. Do not drink alcohol. Activity Return to your normal activities as told by your health care provider. Ask your health care provider what activities are safe for you. In the morning, first sit up on the side of the bed. When you feel okay, stand slowly while you hold onto something until you know that your balance is fine. Move slowly. Avoid sudden body or head movements or certain positions, as told by your health care provider. If you have trouble walking or keeping your balance, try using a cane for stability. If you feel dizzy or unstable, sit down right away. Avoid doing any tasks that would cause danger to you or others if vertigo occurs. Avoid bending down if you feel dizzy. Place items in your home so that they are easy for you to reach without bending or leaning over. Do not drive or use machinery if you feel dizzy. General instructions Take over-the-counter and prescription medicines only as told by your health care provider. Keep all follow-up visits. This is important. Contact a health care provider if: Your medicines do not relieve your vertigo or they make it worse. Your condition gets worse or you develop new symptoms. You have a fever. You develop nausea or vomiting, or if nausea gets worse. Your family or friends  notice any behavioral changes. You have numbness or a prickling and tingling sensation in part of your body. Get help right away if you: Are always dizzy or you faint. Develop severe headaches. Develop a stiff neck. Develop sensitivity to light. Have difficulty moving or speaking. Have weakness in your hands, arms, or legs. Have changes in your hearing or vision. These symptoms may represent a serious problem that is an emergency. Do not wait to see if the symptoms will go away. Get medical help right away. Call your local emergency services (911 in the [place] Do not drive yourself to the hospital. Summary Vertigo is the feeling that you or your surroundings are moving when they are not. Your health care provider will do tests to try to determine the cause of your vertigo. Follow instructions for home care. You may be told to avoid certain tasks, positions, or movements. Contact a health care provider if your medicines do not relieve your symptoms, or if you have a fever, nausea, vomiting, or changes in behavior. Get help right away if you have severe headaches or difficulty speaking, or you develop hearing or vision problems. This information is not intended to replace advice given to you by your health care provider. Make sure you discuss any questions you have with your health care provider. Document Revised: 11/04/2020 Document Reviewed: 11/04/2020 Elsevier Patient Education  [place].

## 2023-03-22 ENCOUNTER — Ambulatory Visit: Payer: Medicare Other | Attending: Cardiovascular Disease

## 2023-03-22 DIAGNOSIS — I639 Cerebral infarction, unspecified: Secondary | ICD-10-CM

## 2023-03-22 DIAGNOSIS — Z5181 Encounter for therapeutic drug level monitoring: Secondary | ICD-10-CM | POA: Diagnosis not present

## 2023-03-22 DIAGNOSIS — Z8673 Personal history of transient ischemic attack (TIA), and cerebral infarction without residual deficits: Secondary | ICD-10-CM

## 2023-03-22 LAB — POCT INR: INR: 2.8 (ref 2.0–3.0)

## 2023-03-22 NOTE — Patient Instructions (Addendum)
Description   Continue on same dosage of Warfarin 1 tablet daily except 1/2 tablet on Mondays, Wednesdays and Fridays.  Recheck INR in 4 weeks.

## 2023-04-04 ENCOUNTER — Telehealth: Payer: Self-pay | Admitting: Medical

## 2023-04-04 NOTE — Telephone Encounter (Signed)
Left voicemail message to call back  

## 2023-04-04 NOTE — Telephone Encounter (Signed)
Pt called back. °

## 2023-04-04 NOTE — Telephone Encounter (Signed)
Pt came into the office and needs documentation for employer regarding sleep study results. Please call to discuss.

## 2023-04-05 NOTE — Telephone Encounter (Signed)
Patient is calling back for update. States that she could lose her job if the forms is not filled out. Please advise

## 2023-04-05 NOTE — Telephone Encounter (Signed)
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  She states she had a sleep study done.  She states a letter was sent to [place] yesterday.  She said she stopped by the office yesterday and letter was scan into the chart.

## 2023-04-06 ENCOUNTER — Ambulatory Visit (INDEPENDENT_AMBULATORY_CARE_PROVIDER_SITE_OTHER): Payer: Medicare Other | Admitting: Nurse Practitioner

## 2023-04-06 ENCOUNTER — Encounter: Payer: Self-pay | Admitting: Nurse Practitioner

## 2023-04-06 ENCOUNTER — Encounter (INDEPENDENT_AMBULATORY_CARE_PROVIDER_SITE_OTHER): Payer: Medicare Other

## 2023-04-06 VITALS — BP 124/76 | HR 71 | Temp 98.5°F | Ht 65.0 in | Wt 233.2 lb

## 2023-04-06 DIAGNOSIS — G4733 Obstructive sleep apnea (adult) (pediatric): Secondary | ICD-10-CM

## 2023-04-06 NOTE — Patient Instructions (Addendum)
Given your history, you will need a repeat sleep study for further evaluation and DOT clearance. If you do have sleep apnea, we will need to start you on therapy prior to your clearance.   We discussed how untreated sleep apnea puts an individual at risk for cardiac arrhthymias, pulm HTN, DM, stroke and increases their risk for daytime accidents. We also briefly reviewed treatment options including weight loss, side sleeping position, oral appliance, CPAP therapy or referral to ENT for possible surgical options  Use caution when driving and pull over if you become sleepy.  Follow up in 4 weeks with Mary Rozelia Catapano,NP to go over sleep study results, or sooner, if needed

## 2023-04-06 NOTE — Progress Notes (Signed)
@Patient  ID: Mary [name], female    DOB: 06/17/1966, 57 y.o.   MRN: [id number]  Chief Complaint  Patient presents with   Consult    Hx of OSA.  Needs medical clearance for DOT employment.  S/P stroke and anticoagulant therapy.  No c/o snoring, gasping for air and sleeping well.  Hx of sleep study 2017.  Tried CPAP, could not afford, sent unit back to company.    Referring provider: Hoy Register, MD  HPI: 57 year old female, former smoker referred for sleep consult.  Past medical history significant for OSA not on CPAP, hypertension, history of stroke on AC, DM, antiphospholipid syndrome, anemia, IDA, status post gastric sleeve.  TEST/EVENTS:   04/06/2023: Today-sleep consult Patient presents today for sleep consult.  She has a history of OSA.  She tried CPAP in 2017 but had difficulty tolerating it and it was also expensive so she ended up sending it back to the company.  Has not been on any sort of therapy since and has not had a repeat sleep study.  She did have a gastric sleeve in March 2018 and lost about 70 pounds.  She no longer has any daytime sleepiness, drowsy driving or snoring from what she has been told.  She denies any morning headaches, bruxism, sleep parasomnia/paralysis.  No history of narcolepsy or symptoms of cataplexy.  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 Goes to bed around 8 PM.  Falls asleep within 30 minutes.  Wakes once at night.  Gets up around 4:30 AM.  Last sleep study was in 2017.  She does not have a copy of this. She has a history of high blood pressure and stroke.  She is on chronic anticoagulation therapy.  Blood pressure is well-controlled on antihypertensives.  She is a former smoker.  Quit in 2007.  Does not drink any alcohol.  No excessive caffeine intake.  Lives at home with her mother.  No significant family history.  Epworth 1  No Known  Allergies  Immunization History  Administered Date(s) Administered   Influenza,inj,Quad PF,6+ Mos 08/29/2019   PPD Test 04/12/2018   Tdap 01/30/2017, 09/06/2021    Past Medical History:  Diagnosis Date   Blood transfusion without reported diagnosis    transfusion December 2016   Clotting disorder    left leg   Diabetes mellitus without complication    Hypertension    Other and unspecified ovarian cysts    Stroke    Vaginal Pap smear, abnormal     Tobacco History: Social History   Tobacco Use  Smoking Status Former   Packs/day: 1.00   Years: 10.00   Additional pack years: 0.00   Total pack years: 10.00   Types: Cigarettes   Quit date: 07/14/2006   Years since quitting: 16.7  Smokeless Tobacco Never  Tobacco Comments   quit smoking around 2007   Counseling given: Not Answered Tobacco comments: quit smoking around 2007   Outpatient Medications Prior to Visit  Medication Sig Dispense Refill   amLODipine (NORVASC) 10 MG tablet Take 1 tablet (10 mg total) by mouth daily. 90 tablet 2   Apremilast (OTEZLA) 30 MG TABS Take 1 tablet (30 mg total) by mouth 2 (two) times daily. (Patient taking differently: Take 1 tablet by mouth daily.) 60 tablet 2   atorvastatin (LIPITOR) 40 MG tablet Take 1 tablet (40 mg total) by mouth  daily. 90 tablet 3   Calcipotriene-Betameth Diprop (WYNZORA) 0.005-0.064 % CREA Apply 1 application. topically as directed. Apply twice daily for two weeks then decrease once daily for 5 days. Avoid Face, groin and underarms. 60 g 0   cyanocobalamin 1000 MCG tablet Take 1,000 mcg by mouth daily.     fluticasone (FLONASE) 50 MCG/ACT nasal spray Place 2 sprays into both nostrils daily. (Patient taking differently: Place 2 sprays into both nostrils daily as needed for allergies or rhinitis.) 16 g 6   Iron, Ferrous Sulfate, 325 (65 Fe) MG TABS Take 325 mg by mouth daily. 60 tablet 3   loratadine (CLARITIN) 10 MG tablet Take 10 mg by mouth daily as needed for  allergies or rhinitis.     meclizine (ANTIVERT) 25 MG tablet Take 25 mg by mouth 3 (three) times daily as needed.     metoprolol tartrate (LOPRESSOR) 25 MG tablet Take 1 tablet (25 mg total) by mouth 2 (two) times daily. 180 tablet 3   mometasone (ELOCON) 0.1 % cream APPLY TOPICALLY DAILY AS NEEDED FOR RASH. APPLY UP TO FIVE DAYS PER WEEK TO THE AFFECTED AREA AS NEEDED UNTIL IMPROVED. THEN DISCONTINUE 45 g 0   NON FORMULARY [employer] BRAND: STOOL SOFTNERS     Vitamin D, Ergocalciferol, (DRISDOL) 1.25 MG (50000 UNIT) CAPS capsule Take 1,250 Units by mouth every 7 (seven) days.     warfarin (COUMADIN) 5 MG tablet TAKE 1/2 TO 1 TABLET BY MOUTH ONCE DAILY OR AS DIRECTED 90 tablet 1   Na Sulfate-K Sulfate-Mg Sulf 17.5-3.13-1.6 GM/177ML SOLN  (Patient not taking: Reported on 04/06/2023)     No facility-administered medications prior to visit.     Review of Systems:   Constitutional: No weight loss or gain, night sweats, fevers, chills, fatigue, or lassitude. HEENT: No headaches, difficulty swallowing, tooth/dental problems, or sore throat. No sneezing, itching, ear ache, nasal congestion, or post nasal drip CV:  No chest pain, orthopnea, PND, swelling in lower extremities, anasarca, dizziness, palpitations, syncope Resp: No shortness of breath with exertion or at rest. No excess mucus or change in color of mucus. No productive or non-productive. No hemoptysis. No wheezing.  No chest wall deformity GI:  No heartburn, indigestion, abdominal pain, nausea, vomiting, diarrhea, change in bowel habits, loss of appetite, bloody stools.  GU: No dysuria, change in color of urine, urgency or frequency.  Skin: No rash, lesions, ulcerations MSK:  No joint pain or swelling.   Neuro: No dizziness or lightheadedness.  Psych: No depression or anxiety. Mood stable.     Physical Exam:  BP 124/76 (BP Location: Right Arm, Patient Position: Sitting, Cuff Size: Large)   Pulse 71   Temp 98.5 F (36.9 C) (Oral)   Ht   (1.651 m)   Wt 233 lb 3.2 oz (105.8 kg)   LMP 04/04/2019 (Approximate)   SpO2 99%   BMI 38.81 kg/m   GEN: Pleasant, interactive, well-appearing; obese; in no acute distress. HEENT:  Normocephalic and atraumatic. PERRLA. Sclera white. Nasal turbinates pink, moist and patent bilaterally. No rhinorrhea present. Oropharynx pink and moist, without exudate or edema. No lesions, ulcerations, or postnasal drip. Mallampati III NECK:  Supple w/ fair ROM. No JVD present. Normal carotid impulses w/o bruits. Thyroid symmetrical with no goiter or nodules palpated. No lymphadenopathy.   CV: RRR, no m/r/g, no peripheral edema. Pulses intact, +2 bilaterally. No cyanosis, pallor or clubbing. PULMONARY:  Unlabored, regular breathing. Clear bilaterally A&P w/o wheezes/rales/rhonchi. No accessory muscle use.  GI:  BS present and normoactive. Soft, non-tender to palpation. No organomegaly or masses detected.  MSK: No erythema, warmth or tenderness. Cap refil <2 sec all extrem. No deformities or joint swelling noted.  Neuro: A/Ox3. No focal deficits noted.   Skin: Warm, no lesions or rashe Psych: Normal affect and behavior. Judgement and thought content appropriate.     Lab Results:  CBC    Component Value Date/Time   WBC 4.9 02/22/2023 1218   WBC 6.9 09/06/2021 1827   RBC 4.94 02/22/2023 1218   RBC 4.96 09/06/2021 1827   HGB 11.3 02/22/2023 1218   HGB 10.0 (L) 04/17/2015 0948   HCT 35.4 02/22/2023 1218   HCT 32.1 (L) 04/17/2015 0948   PLT 280 02/22/2023 1218   MCV 72 (L) 02/22/2023 1218   MCV 66.0 (L) 04/17/2015 0948   MCH 22.9 (L) 02/22/2023 1218   MCH 23.2 (L) 09/06/2021 1827   MCHC 31.9 02/22/2023 1218   MCHC 31.5 09/06/2021 1827   RDW 14.8 02/22/2023 1218   RDW 18.2 (H) 04/17/2015 0948   LYMPHSABS 1.5 02/22/2023 1218   LYMPHSABS 1.8 04/17/2015 0948   MONOABS 0.4 03/30/2021 2248   MONOABS 0.3 04/17/2015 0948   EOSABS 0.3 02/22/2023 1218   EOSABS 0.1 10/03/2014 1004   BASOSABS 0.0  02/22/2023 1218   BASOSABS 0.1 04/17/2015 0948    BMET    Component Value Date/Time   NA 143 02/22/2023 1218   NA 140 10/03/2014 1004   K 3.6 02/22/2023 1218   K 3.6 10/03/2014 1004   CL 105 02/22/2023 1218   CL 106 10/03/2014 1004   CO2 25 11/08/2021 0840   CO2 29 10/03/2014 1004   GLUCOSE 141 (H) 02/22/2023 1218   GLUCOSE 95 09/16/2021 1401   GLUCOSE 100 (H) 10/03/2014 1004   BUN 10 02/22/2023 1218   BUN 9 10/03/2014 1004   CREATININE 1.06 (H) 02/22/2023 1218   CREATININE 0.75 01/30/2017 1135   CALCIUM 8.7 02/22/2023 1218   CALCIUM 8.2 (L) 10/03/2014 1004   GFRNONAA 49 (L) 09/16/2021 1401   GFRNONAA >89 01/30/2017 1135   GFRAA 67 03/25/2020 1020   GFRAA >89 01/30/2017 1135    BNP No results found for: "BNP"   Imaging:  No results found.        No data to display          No results found for: "NITRICOXIDE"      Assessment & Plan:   Sleep apnea She has a history of OSA. She needs DOT clearance, which will require repeat sleep study to rule out underlying sleep disordered breathing with obstructive sleep apnea. Discussed that if she does still have underlying OSA, she will need to resume some form of therapy prior to clearance.    - discussed how weight can impact sleep and risk for sleep disordered breathing - discussed options to assist with weight loss: combination of diet modification, cardiovascular and strength training exercises   - had an extensive discussion regarding the adverse health consequences related to untreated sleep disordered breathing - specifically discussed the risks for hypertension, coronary artery disease, cardiac dysrhythmias, cerebrovascular disease, and diabetes - lifestyle modification discussed   - discussed how sleep disruption can increase risk of accidents, particularly when driving - safe driving practices were discussed  Patient Instructions  Given your history, you will need a repeat sleep study for further  evaluation and DOT clearance. If you do have sleep apnea, we will need to start you on therapy prior to  your clearance.   We discussed how untreated sleep apnea puts an individual at risk for cardiac arrhthymias, pulm HTN, DM, stroke and increases their risk for daytime accidents. We also briefly reviewed treatment options including weight loss, side sleeping position, oral appliance, CPAP therapy or referral to ENT for possible surgical options  Use caution when driving and pull over if you become sleepy.  Follow up in 4 weeks with Florentina Addison Aamari West,NP to go over sleep study results, or sooner, if needed    I spent 35 minutes of dedicated to the care of this patient on the date of this encounter to include pre-visit review of records, face-to-face time with the patient discussing conditions above, post visit ordering of testing, clinical documentation with the electronic health record, making appropriate referrals as documented, and communicating necessary findings to members of the patients care team.  Noemi Chapel, NP 04/09/2023  Pt aware and understands NP's role.

## 2023-04-06 NOTE — Telephone Encounter (Signed)
Patient in lobby Patient needs DOT clearance ASAP Please advise what steps she needs to take Spoke with Herbert Seta to advise

## 2023-04-06 NOTE — Telephone Encounter (Signed)
Patient pulled back to a room and Cadence, PA reviewed her letter and spoke with her. She advised that the letter was asking from clearance from a stroke perspective, which will need to be addressed by her PCP/ neurology & her sleep apnea issue will need to be addressed by pulmonology who has followed her previously.  The patient voices understanding and is agreeable. She will try to make an appointment with Pulmonary on the way out today and call her PCP to follow up as well.

## 2023-04-06 NOTE — Progress Notes (Signed)
0

## 2023-04-09 ENCOUNTER — Encounter: Payer: Self-pay | Admitting: Nurse Practitioner

## 2023-04-09 NOTE — Assessment & Plan Note (Signed)
She has a history of OSA. She needs DOT clearance, which will require repeat sleep study to rule out underlying sleep disordered breathing with obstructive sleep apnea. Discussed that if she does still have underlying OSA, she will need to resume some form of therapy prior to clearance.    - discussed how weight can impact sleep and risk for sleep disordered breathing - discussed options to assist with weight loss: combination of diet modification, cardiovascular and strength training exercises   - had an extensive discussion regarding the adverse health consequences related to untreated sleep disordered breathing - specifically discussed the risks for hypertension, coronary artery disease, cardiac dysrhythmias, cerebrovascular disease, and diabetes - lifestyle modification discussed   - discussed how sleep disruption can increase risk of accidents, particularly when driving - safe driving practices were discussed  Patient Instructions  Given your history, you will need a repeat sleep study for further evaluation and DOT clearance. If you do have sleep apnea, we will need to start you on therapy prior to your clearance.   We discussed how untreated sleep apnea puts an individual at risk for cardiac arrhthymias, pulm HTN, DM, stroke and increases their risk for daytime accidents. We also briefly reviewed treatment options including weight loss, side sleeping position, oral appliance, CPAP therapy or referral to ENT for possible surgical options  Use caution when driving and pull over if you become sleepy.  Follow up in 4 weeks with Katie Kyllie Pettijohn,NP to go over sleep study results, or sooner, if needed

## 2023-04-10 ENCOUNTER — Telehealth: Payer: Self-pay | Admitting: Nurse Practitioner

## 2023-04-10 NOTE — Telephone Encounter (Signed)
Pt. Called back gave her the message about taking up to 2 weeks to read but stated she needs to get back to work asap

## 2023-04-11 NOTE — Telephone Encounter (Signed)
I spoke to pt & scheduled her for repeat hst.  Nothing further needed.

## 2023-04-11 NOTE — Telephone Encounter (Signed)
Mary [name] had called her to get her rescheduled she only had 30 mins on her test this info was in the order on the appt desk

## 2023-04-11 NOTE — Telephone Encounter (Signed)
Do we know which sleep doctor is supposed to be reading pt's sleep study results?

## 2023-04-13 ENCOUNTER — Ambulatory Visit: Payer: Medicare Other

## 2023-04-13 ENCOUNTER — Telehealth: Payer: Self-pay | Admitting: Nurse Practitioner

## 2023-04-13 ENCOUNTER — Encounter: Payer: Self-pay | Admitting: Oncology

## 2023-04-13 DIAGNOSIS — G4733 Obstructive sleep apnea (adult) (pediatric): Secondary | ICD-10-CM | POA: Diagnosis not present

## 2023-04-13 NOTE — Telephone Encounter (Signed)
Contacted Mary [name] to schedule their annual wellness visit. Appointment made for 04/20/23.  Mary [name] AWV direct phone # [phone]

## 2023-04-14 ENCOUNTER — Telehealth: Payer: Self-pay | Admitting: Nurse Practitioner

## 2023-04-14 NOTE — Telephone Encounter (Signed)
Patient had 10 hours on the machine I have downloaded it and sent the message to Dr Craige Cotta to review

## 2023-04-14 NOTE — Telephone Encounter (Signed)
Mary [name] received an after hours call from pt who stated she was having difficulty trying to get the machine to work for her to be able to do the HST. Mary [name] stated she told the pt to try to see if she could get it to work and also stated that we would have PCCs reach out to her.  Routing encounter to PCCs to see if they can reach out to pt.

## 2023-04-17 ENCOUNTER — Telehealth: Payer: Self-pay | Admitting: Nurse Practitioner

## 2023-04-17 NOTE — Telephone Encounter (Signed)
Please call PT w/sleep study results. [phone]

## 2023-04-18 DIAGNOSIS — G4733 Obstructive sleep apnea (adult) (pediatric): Secondary | ICD-10-CM | POA: Diagnosis not present

## 2023-04-18 NOTE — Telephone Encounter (Signed)
Katie please advise of Sleep Study results if available.  Please route message back to triage

## 2023-04-19 ENCOUNTER — Ambulatory Visit: Payer: Medicare Other | Attending: Cardiovascular Disease | Admitting: *Deleted

## 2023-04-19 DIAGNOSIS — Z5181 Encounter for therapeutic drug level monitoring: Secondary | ICD-10-CM | POA: Diagnosis not present

## 2023-04-19 DIAGNOSIS — I639 Cerebral infarction, unspecified: Secondary | ICD-10-CM | POA: Diagnosis not present

## 2023-04-19 LAB — POCT INR: INR: 3.2 — AB (ref 2.0–3.0)

## 2023-04-19 NOTE — Telephone Encounter (Signed)
Pt has been my-charted. Closing encounter

## 2023-04-19 NOTE — Patient Instructions (Signed)
Continue on same dosage of Warfarin 1 tablet daily except 1/2 tablet on Mondays, Wednesdays and Fridays.  Recheck INR in 4 weeks.

## 2023-04-19 NOTE — Telephone Encounter (Signed)
Dr. Craige Cotta, pt would like HST results read asap due to work ([employer]).

## 2023-04-19 NOTE — Telephone Encounter (Signed)
Her sleep study was just completed 3-4 days ago. Can take 2-3 weeks to get final results. Will update her once we have these. Thanks.

## 2023-04-20 ENCOUNTER — Ambulatory Visit: Payer: Medicare Other | Attending: Family Medicine

## 2023-04-20 VITALS — Ht 65.0 in | Wt 230.0 lb

## 2023-04-20 DIAGNOSIS — Z0000 Encounter for general adult medical examination without abnormal findings: Secondary | ICD-10-CM

## 2023-04-20 NOTE — Telephone Encounter (Signed)
It's been read and given to be scanned into Epic.  Please check with [facility] when the study will be scanned into Epic.

## 2023-04-20 NOTE — Progress Notes (Signed)
I connected with  [name] on 04/20/23 by a audio enabled telemedicine application and verified that I am speaking with the correct person using two identifiers.  Patient Location: Home  Provider Location: Office/Clinic  I discussed the limitations of evaluation and management by telemedicine. The patient expressed understanding and agreed to proceed.  Subjective:   Mary [name] is a 57 y.o. female who presents for Medicare Annual (Subsequent) preventive examination.  Review of Systems     Cardiac Risk Factors include: diabetes mellitus;hypertension;obesity (BMI >30kg/m2)     Objective:    Today's Vitals   04/20/23 1127  Weight: 230 lb (104.3 kg)  Height: 5\' 5"  (1.651 m)   Body mass index is 38.27 kg/m.     04/20/2023   11:35 AM 07/08/2022    2:20 PM 05/06/2022   10:13 AM 01/28/2022    8:59 AM 09/16/2021    2:16 PM 03/30/2021   10:20 PM 04/27/2020   11:01 AM  Advanced Directives  Does Patient Have a Medical Advance Directive? No No No No No No No  Would patient like information on creating a medical advance directive?  No - Patient declined No - Patient declined  No - Patient declined      Current Medications (verified) Outpatient Encounter Medications as of 04/20/2023  Medication Sig   amLODipine (NORVASC) 10 MG tablet Take 1 tablet (10 mg total) by mouth daily.   Apremilast (OTEZLA) 30 MG TABS Take 1 tablet (30 mg total) by mouth 2 (two) times daily. (Patient taking differently: Take 1 tablet by mouth daily.)   atorvastatin (LIPITOR) 40 MG tablet Take 1 tablet (40 mg total) by mouth daily.   Calcipotriene-Betameth Diprop (WYNZORA) 0.005-0.064 % CREA Apply 1 application. topically as directed. Apply twice daily for two weeks then decrease once daily for 5 days. Avoid Face, groin and underarms.   Cholecalciferol (VITAMIN D-3) 25 MCG (1000 UT) CAPS Take by mouth.   cyanocobalamin 1000 MCG tablet Take 1,000 mcg by mouth daily.   fluticasone (FLONASE) 50 MCG/ACT nasal  spray Place 2 sprays into both nostrils daily. (Patient taking differently: Place 2 sprays into both nostrils daily as needed for allergies or rhinitis.)   Iron, Ferrous Sulfate, 325 (65 Fe) MG TABS Take 325 mg by mouth daily.   loratadine (CLARITIN) 10 MG tablet Take 10 mg by mouth daily as needed for allergies or rhinitis.   metoprolol tartrate (LOPRESSOR) 25 MG tablet Take 1 tablet (25 mg total) by mouth 2 (two) times daily.   mometasone (ELOCON) 0.1 % cream APPLY TOPICALLY DAILY AS NEEDED FOR RASH. APPLY UP TO FIVE DAYS PER WEEK TO THE AFFECTED AREA AS NEEDED UNTIL IMPROVED. THEN DISCONTINUE   NON FORMULARY [employer] BRAND: STOOL SOFTNERS   warfarin (COUMADIN) 5 MG tablet TAKE 1/2 TO 1 TABLET BY MOUTH ONCE DAILY OR AS DIRECTED   meclizine (ANTIVERT) 25 MG tablet Take 25 mg by mouth 3 (three) times daily as needed. (Patient not taking: Reported on 04/20/2023)   Na Sulfate-K Sulfate-Mg Sulf 17.5-3.13-1.6 GM/177ML SOLN  (Patient not taking: Reported on 04/06/2023)   Vitamin D, Ergocalciferol, (DRISDOL) 1.25 MG (50000 UNIT) CAPS capsule Take 1,250 Units by mouth every 7 (seven) days. (Patient not taking: Reported on 04/20/2023)   No facility-administered encounter medications on file as of 04/20/2023.    Allergies (verified) Patient has no known allergies.   History: Past Medical History:  Diagnosis Date   Blood transfusion without reported diagnosis    transfusion December 2016  Clotting disorder (HCC)    left leg   Diabetes mellitus without complication (HCC)    Hypertension    Other and unspecified ovarian cysts    Stroke [facility])    Vaginal Pap smear, abnormal    Past Surgical History:  Procedure Laterality Date   CLOSED REDUCTION CLAVICULAR Left 09/16/2021   Procedure: left shoulder sternoclavicular joint reduction;  Surgeon: Cammy Copa, MD;  Location[facility] OR;  Service: Orthopedics;  Laterality: Left;   COLONOSCOPY WITH PROPOFOL N/A 11/10/2015   Procedure: COLONOSCOPY WITH  PROPOFOL;  Surgeon: Midge Minium, MD;  Location: [facility] ENDOSCOPY;  Service: Endoscopy;  Laterality: N/A;   COLONOSCOPY WITH PROPOFOL N/A 01/28/2022   Procedure: COLONOSCOPY WITH PROPOFOL;  Surgeon: Toney Reil, MD;  Location: [facility] ENDOSCOPY;  Service: Gastroenterology;  Laterality: N/A;   ESOPHAGOGASTRODUODENOSCOPY (EGD) WITH PROPOFOL N/A 11/10/2015   Procedure: ESOPHAGOGASTRODUODENOSCOPY (EGD) WITH PROPOFOL;  Surgeon: Midge Minium, MD;  Location: [facility] ENDOSCOPY;  Service: Endoscopy;  Laterality: N/A;   ESOPHAGOGASTRODUODENOSCOPY (EGD) WITH PROPOFOL N/A 01/28/2022   Procedure: ESOPHAGOGASTRODUODENOSCOPY (EGD) WITH PROPOFOL;  Surgeon: Toney Reil, MD;  Location: [facility] ENDOSCOPY;  Service: Gastroenterology;  Laterality: N/A;   LAPAROSCOPIC GASTRIC SLEEVE RESECTION  02/17/2016   placement   LOOP RECORDER IMPLANT N/A 04/25/2014   Procedure: LOOP RECORDER IMPLANT;  Surgeon: Gardiner Rhyme, MD;  Location: [facility] CATH LAB;  Service: Cardiovascular;  Laterality: N/A;   LOOP RECORDER REMOVAL N/A 02/01/2018   Procedure: LOOP RECORDER REMOVAL;  Surgeon: Duke Salvia, MD;  Location: [facility] INVASIVE CV LAB;  Service: Cardiovascular;  Laterality: N/A;   OVARIAN CYST REMOVAL     TEE WITHOUT CARDIOVERSION N/A 01/25/2013   Procedure: TRANSESOPHAGEAL ECHOCARDIOGRAM (TEE);  Surgeon: Ricki Rodriguez, MD;  Location: [facility] ENDOSCOPY;  Service: Cardiovascular;  Laterality: N/A;   Family History  Problem Relation Age of Onset   Diabetes Mellitus II Mother    Diabetes Mother    Stroke Mother    Transient ischemic attack Father    Stroke Father    Stroke Other    Diabetes Other    Diabetes Sister    Social History   Socioeconomic History   Marital status: Single    Spouse name: Not on file   Number of children: 2   Years of education: 13   Highest education level: Not on file  Occupational History   Not on file  Tobacco Use   Smoking status: Former    Packs/day: 1.00    Years: 10.00    Additional pack  years: 0.00    Total pack years: 10.00    Types: Cigarettes    Quit date: 07/14/2006    Years since quitting: 16.7   Smokeless tobacco: Never   Tobacco comments:    quit smoking around 2007  Vaping Use   Vaping Use: Never used  Substance and Sexual Activity   Alcohol use: No    Alcohol/week: 0.0 standard drinks of alcohol   Drug use: No   Sexual activity: Yes  Other Topics Concern   Not on file  Social History Narrative   ** Merged History Encounter **       Patient is single with two children. Patient is right handed. Patient has 13 yrs of education. Patient drinks 5 sodas daily.   Social Determinants of Health   Financial Resource Strain: Low Risk  (04/20/2023)   Overall Financial Resource Strain (CARDIA)    Difficulty of Paying Living Expenses: Not hard at all  Food Insecurity: No  Food Insecurity (04/20/2023)   Hunger Vital Sign    Worried About Running Out of Food in the Last Year: Never true    Ran Out of Food in the Last Year: Never true  Transportation Needs: No Transportation Needs (04/20/2023)   PRAPARE - Administrator, Civil Service (Medical): No    Lack of Transportation (Non-Medical): No  Physical Activity: Inactive (04/20/2023)   Exercise Vital Sign    Days of Exercise per Week: 0 days    Minutes of Exercise per Session: 0 min  Stress: No Stress Concern Present (04/20/2023)   [employer] of Occupational Health - Occupational Stress Questionnaire    Feeling of Stress : Only a little  Social Connections: Not on file    Tobacco Counseling Counseling given: Not Answered Tobacco comments: quit smoking around 2007   Clinical Intake:  Pre-visit preparation completed: Yes  Pain : No/denies pain     Nutritional Status: BMI > 30  Obese Nutritional Risks: None Diabetes: Yes  How often do you need to have someone help you when you read instructions, pamphlets, or other written materials from your doctor or pharmacy?: 1 - Never  Diabetic?  Yes Nutrition Risk Assessment:  Has the patient had any N/V/D within the last 2 months?  No  Does the patient have any non-healing wounds?  No  Has the patient had any unintentional weight loss or weight gain?  No   Diabetes:  Is the patient diabetic?  Yes  If diabetic, was a CBG obtained today?  No  Did the patient bring in their glucometer from home?  No  How often do you monitor your CBG's? Does not.   Financial Strains and Diabetes Management:  Are you having any financial strains with the device, your supplies or your medication? No .  Does the patient want to be seen by Chronic Care Management for management of their diabetes?  No  Would the patient like to be referred to a Nutritionist or for Diabetic Management?  No   Diabetic Exams:  Diabetic Eye Exam: Overdue for diabetic eye exam. Pt has been advised about the importance in completing this exam. Patient advised to call and schedule an eye exam. Diabetic Foot Exam: Overdue, Pt has been advised about the importance in completing this exam. Pt is scheduled for diabetic foot exam on next appointment.   Interpreter Needed?: No  Information entered by :: NAllen LPN   Activities of Daily Living    04/20/2023   11:36 AM  In your present state of health, do you have any difficulty performing the following activities:  Hearing? 1  Comment has decreased hearing  Vision? 1  Difficulty concentrating or making decisions? 1  Walking or climbing stairs? 0  Dressing or bathing? 0  Doing errands, shopping? 0  Preparing Food and eating ? N  Using the Toilet? N  In the past six months, have you accidently leaked urine? N  Do you have problems with loss of bowel control? N  Managing your Medications? N  Managing your Finances? N  Housekeeping or managing your Housekeeping? N    Patient Care Team: Noemi Chapel, NP as PCP - General (Nurse Practitioner) Antonieta Iba, MD as PCP - Cardiology (Cardiology) Duke Salvia, MD as PCP - Electrophysiology (Cardiology) Hoy Register, MD (Family Medicine)  Indicate any recent Medical Services you may have received from other than [facility] providers in the past year (date may be approximate).  Assessment:   This is a routine wellness examination for Jahna.  Hearing/Vision screen Vision Screening - Comments:: Regular eye exams, [facility]  Dietary issues and exercise activities discussed: Current Exercise Habits: The patient has a physically strenuous job, but has no regular exercise apart from work.   Goals Addressed             This Visit's Progress    Patient Stated       04/20/2023, no goals       Depression Screen    04/20/2023   11:36 AM 03/09/2023   10:44 AM 02/22/2023   11:12 AM 09/15/2021    2:50 PM 07/26/2021    2:31 PM 04/12/2021    1:59 PM 04/01/2021    9:35 AM  PHQ 2/9 Scores  PHQ - 2 Score 0  0 2 1 0 2  PHQ- 9 Score   10 8 3  0 12  Exception Documentation  Patient refusal         Fall Risk    04/20/2023   11:36 AM 03/09/2023   10:44 AM 02/22/2023   11:27 AM 12/01/2021   10:08 AM 07/26/2021    2:31 PM  Fall Risk   Falls in the past year? 0 0 0 0 0  Number falls in past yr: 0 0 0 0 0  Injury with Fall? 0 0 0 0 0  Risk for fall due to : Medication side effect No Fall Risks No Fall Risks  No Fall Risks  Follow up Falls prevention discussed;Education provided;Falls evaluation completed  Falls evaluation completed  Falls evaluation completed    FALL RISK PREVENTION PERTAINING TO THE HOME:  Any stairs in or around the home? Yes  If so, are there any without handrails? No  Home free of loose throw rugs in walkways, pet beds, electrical cords, etc? Yes  Adequate lighting in your home to reduce risk of falls? Yes   ASSISTIVE DEVICES UTILIZED TO PREVENT FALLS:  Life alert? No  Use of a cane, walker or w/c? No  Grab bars in the bathroom? Yes  Shower chair or bench in shower? No  Elevated toilet seat or a handicapped toilet? No    TIMED UP AND GO:  Was the test performed? No .      Cognitive Function:    04/27/2020   11:48 AM  MMSE - Mini Mental State Exam  Orientation to time 5  Orientation to Place 5  Registration 3  Attention/ Calculation 5  Recall 2  Language- name 2 objects 2  Language- repeat 1  Language- follow 3 step command 3  Language- read & follow direction 1  Write a sentence 1  Copy design 1  Total score 29        04/20/2023   11:38 AM  6CIT Screen  What Year? 0 points  What month? 0 points  What time? 0 points  Count back from 20 4 points  Months in reverse 4 points  Repeat phrase 4 points  Total Score 12 points    Immunizations Immunization History  Administered Date(s) Administered   Influenza,inj,Quad PF,6+ Mos 08/29/2019   PPD Test 04/12/2018   Tdap 01/30/2017, 09/06/2021    TDAP status: Up to date  Flu Vaccine status: Up to date  Pneumococcal vaccine status: Up to date  Covid-19 vaccine status: Completed vaccines  Qualifies for Shingles Vaccine? Yes   Zostavax completed No   Shingrix Completed?: No.    Education has been provided regarding  the importance of this vaccine. Patient has been advised to call insurance company to determine out of pocket expense if they have not yet received this vaccine. Advised may also receive vaccine at local pharmacy or Health Dept. Verbalized acceptance and understanding.  Screening Tests Health Maintenance  Topic Date Due   COVID-19 Vaccine (1) Never done   OPHTHALMOLOGY EXAM  Never done   Zoster Vaccines- Shingrix (1 of 2) Never done   Medicare Annual Wellness (AWV)  04/27/2021   Diabetic kidney evaluation - Urine ACR  04/12/2022   FOOT EXAM  12/01/2022   INFLUENZA VACCINE  07/20/2023   HEMOGLOBIN A1C  08/25/2023   Diabetic kidney evaluation - eGFR measurement  02/22/2024   PAP SMEAR-Modifier  08/02/2024   MAMMOGRAM  12/16/2024   COLONOSCOPY (Pts 45-87yrs Insurance coverage will need to be confirmed)  01/28/2025    DTaP/Tdap/Td (3 - Td or Tdap) 09/07/2031   Hepatitis C Screening  Completed   HIV Screening  Completed   HPV VACCINES  Aged Out    Health Maintenance  Health Maintenance Due  Topic Date Due   COVID-19 Vaccine (1) Never done   OPHTHALMOLOGY EXAM  Never done   Zoster Vaccines- Shingrix (1 of 2) Never done   Medicare Annual Wellness (AWV)  04/27/2021   Diabetic kidney evaluation - Urine ACR  04/12/2022   FOOT EXAM  12/01/2022    Colorectal cancer screening: Type of screening: Colonoscopy. Completed 01/28/2022. Repeat every 3 years  Mammogram status: Completed 12/16/2022. Repeat every year  Bone Density status: n/a  Lung Cancer Screening: (Low Dose CT Chest recommended if Age 47-80 years, 30 pack-year currently smoking OR have quit w/in 15years.) does not qualify.   Lung Cancer Screening Referral: no  Additional Screening:  Hepatitis C Screening: does qualify; Completed 04/01/2021  Vision Screening: Recommended annual ophthalmology exams for early detection of glaucoma and other disorders of the eye. Is the patient up to date with their annual eye exam?  Yes  Who is the provider or what is the name of the office in which the patient attends annual eye exams? [facility] If pt is not established with a provider, would they like to be referred to a provider to establish care? No .   Dental Screening: Recommended annual dental exams for proper oral hygiene  Community Resource Referral / Chronic Care Management: CRR required this visit?  No   CCM required this visit?  No      Plan:     I have personally reviewed and noted the following in the patient's chart:   Medical and social history Use of alcohol, tobacco or illicit drugs  Current medications and supplements including opioid prescriptions. Patient is not currently taking opioid prescriptions. Functional ability and status Nutritional status Physical activity Advanced directives List of other  physicians Hospitalizations, surgeries, and ER visits in previous 12 months Vitals Screenings to include cognitive, depression, and falls Referrals and appointments  In addition, I have reviewed and discussed with patient certain preventive protocols, quality metrics, and best practice recommendations. A written personalized care plan for preventive services as well as general preventive health recommendations were provided to patient.     Barb Merino, LPN   4/0/9811   Nurse Notes: none  Due to this being a virtual visit, the after visit summary with patients personalized plan was offered to patient via mail or my-chart. Patient would like to access on my-chart

## 2023-04-20 NOTE — Telephone Encounter (Signed)
Mary [name], can you check with PCCs on this ? Thanks!

## 2023-04-20 NOTE — Patient Instructions (Signed)
Mary [name] , Thank you for taking time to come for your Medicare Wellness Visit. I appreciate your ongoing commitment to your health goals. Please review the following plan we discussed and let me know if I can assist you in the future.   These are the goals we discussed:  Goals      Patient Stated     04/20/2023, no goals        This is a list of the screening recommended for you and due dates:  Health Maintenance  Topic Date Due   COVID-19 Vaccine (1) Never done   Eye exam for diabetics  Never done   Zoster (Shingles) Vaccine (1 of 2) Never done   Yearly kidney health urinalysis for diabetes  04/12/2022   Complete foot exam   12/01/2022   Flu Shot  07/20/2023   Hemoglobin A1C  08/25/2023   Yearly kidney function blood test for diabetes  02/22/2024   Medicare Annual Wellness Visit  04/19/2024   Pap Smear  08/02/2024   Mammogram  12/16/2024   Colon Cancer Screening  01/28/2025   DTaP/Tdap/Td vaccine (3 - Td or Tdap) 09/07/2031   Hepatitis C Screening: USPSTF Recommendation to screen - Ages 10-79 yo.  Completed   HIV Screening  Completed   HPV Vaccine  Aged Out    Advanced directives: Advance directive discussed with you today.   Conditions/risks identified: none  Next appointment: Follow up in one year for your annual wellness visit.   Preventive Care 40-64 Years, Female Preventive care refers to lifestyle choices and visits with your health care provider that can promote health and wellness. What does preventive care include? A yearly physical exam. This is also called an annual well check. Dental exams once or twice a year. Routine eye exams. Ask your health care provider how often you should have your eyes checked. Personal lifestyle choices, including: Daily care of your teeth and gums. Regular physical activity. Eating a healthy diet. Avoiding tobacco and drug use. Limiting alcohol use. Practicing safe sex. Taking low-dose aspirin daily starting at age  59. Taking vitamin and mineral supplements as recommended by your health care provider. What happens during an annual well check? The services and screenings done by your health care provider during your annual well check will depend on your age, overall health, lifestyle risk factors, and family history of disease. Counseling  Your health care provider may ask you questions about your: Alcohol use. Tobacco use. Drug use. Emotional well-being. Home and relationship well-being. Sexual activity. Eating habits. Work and work Astr[employer]. Method of birth control. Menstrual cycle. Pregnancy history. Screening  You may have the following tests or measurements: Height, weight, and BMI. Blood pressure. Lipid and cholesterol levels. These may be checked every 5 years, or more frequently if you are over 12 years old. Skin check. Lung cancer screening. You may have this screening every year starting at age 10 if you have a 30-pack-year history of smoking and currently smoke or have quit within the past 15 years. Fecal occult blood test (FOBT) of the stool. You may have this test every year starting at age 2. Flexible sigmoidoscopy or colonoscopy. You may have a sigmoidoscopy every 5 years or a colonoscopy every 10 years starting at age 51. Hepatitis C blood test. Hepatitis B blood test. Sexually transmitted disease (STD) testing. Diabetes screening. This is done by checking your blood sugar (glucose) after you have not eaten for a while (fasting). You may have this done every 1-3  years. Mammogram. This may be done every 1-2 years. Talk to your health care provider about when you should start having regular mammograms. This may depend on whether you have a family history of breast cancer. BRCA-related cancer screening. This may be done if you have a family history of breast, ovarian, tubal, or peritoneal cancers. Pelvic exam and Pap test. This may be done every 3 years starting at age 85.  Starting at age 49, this may be done every 5 years if you have a Pap test in combination with an HPV test. Bone density scan. This is done to screen for osteoporosis. You may have this scan if you are at high risk for osteoporosis. Discuss your test results, treatment options, and if necessary, the need for more tests with your health care provider. Vaccines  Your health care provider may recommend certain vaccines, such as: Influenza vaccine. This is recommended every year. Tetanus, diphtheria, and acellular pertussis (Tdap, Td) vaccine. You may need a Td booster every 10 years. Zoster vaccine. You may need this after age 62. Pneumococcal 13-valent conjugate (PCV13) vaccine. You may need this if you have certain conditions and were not previously vaccinated. Pneumococcal polysaccharide (PPSV23) vaccine. You may need one or two doses if you smoke cigarettes or if you have certain conditions. Talk to your health care provider about which screenings and vaccines you need and how often you need them. This information is not intended to replace advice given to you by your health care provider. Make sure you discuss any questions you have with your health care provider. Document Released: 01/01/2016 Document Revised: 08/24/2016 Document Reviewed: 10/06/2015 Elsevier Interactive Patient Education  2017 [employer].    Fall Prevention in the Home Falls can cause injuries. They can happen to people of all ages. There are many things you can do to make your home safe and to help prevent falls. What can I do on the outside of my home? Regularly fix the edges of walkways and driveways and fix any cracks. Remove anything that might make you trip as you walk through a door, such as a raised step or threshold. Trim any bushes or trees on the path to your home. Use bright outdoor lighting. Clear any walking paths of anything that might make someone trip, such as rocks or tools. Regularly check to see if  handrails are loose or broken. Make sure that both sides of any steps have handrails. Any raised decks and porches should have guardrails on the edges. Have any leaves, snow, or ice cleared regularly. Use sand or salt on walking paths during winter. Clean up any spills in your garage right away. This includes oil or grease spills. What can I do in the bathroom? Use night lights. Install grab bars by the toilet and in the tub and shower. Do not use towel bars as grab bars. Use non-skid mats or decals in the tub or shower. If you need to sit down in the shower, use a plastic, non-slip stool. Keep the floor dry. Clean up any water that spills on the floor as soon as it happens. Remove soap buildup in the tub or shower regularly. Attach bath mats securely with double-sided non-slip rug tape. Do not have throw rugs and other things on the floor that can make you trip. What can I do in the bedroom? Use night lights. Make sure that you have a light by your bed that is easy to reach. Do not use any sheets or blankets  that are too big for your bed. They should not hang down onto the floor. Have a firm chair that has side arms. You can use this for support while you get dressed. Do not have throw rugs and other things on the floor that can make you trip. What can I do in the kitchen? Clean up any spills right away. Avoid walking on wet floors. Keep items that you use a lot in easy-to-reach places. If you need to reach something above you, use a strong step stool that has a grab bar. Keep electrical cords out of the way. Do not use floor polish or wax that makes floors slippery. If you must use wax, use non-skid floor wax. Do not have throw rugs and other things on the floor that can make you trip. What can I do with my stairs? Do not leave any items on the stairs. Make sure that there are handrails on both sides of the stairs and use them. Fix handrails that are broken or loose. Make sure that  handrails are as long as the stairways. Check any carpeting to make sure that it is firmly attached to the stairs. Fix any carpet that is loose or worn. Avoid having throw rugs at the top or bottom of the stairs. If you do have throw rugs, attach them to the floor with carpet tape. Make sure that you have a light switch at the top of the stairs and the bottom of the stairs. If you do not have them, ask someone to add them for you. What else can I do to help prevent falls? Wear shoes that: Do not have high heels. Have rubber bottoms. Are comfortable and fit you well. Are closed at the toe. Do not wear sandals. If you use a stepladder: Make sure that it is fully opened. Do not climb a closed stepladder. Make sure that both sides of the stepladder are locked into place. Ask someone to hold it for you, if possible. Clearly mark and make sure that you can see: Any grab bars or handrails. First and last steps. Where the edge of each step is. Use tools that help you move around (mobility aids) if they are needed. These include: Canes. Walkers. Scooters. Crutches. Turn on the lights when you go into a dark area. Replace any light bulbs as soon as they burn out. Set up your furniture so you have a clear path. Avoid moving your furniture around. If any of your floors are uneven, fix them. If there are any pets around you, be aware of where they are. Review your medicines with your doctor. Some medicines can make you feel dizzy. This can increase your chance of falling. Ask your doctor what other things that you can do to help prevent falls. This information is not intended to replace advice given to you by your health care provider. Make sure you discuss any questions you have with your health care provider. Document Released: 10/01/2009 Document Revised: 05/12/2016 Document Reviewed: 01/09/2015 Elsevier Interactive Patient Education  2017 [employer].

## 2023-04-21 NOTE — Telephone Encounter (Signed)
Study has been scanned in by [facility]

## 2023-04-21 NOTE — Telephone Encounter (Signed)
PCCs, please advise when the study will be uploaded in the chart. Thanks.

## 2023-04-21 NOTE — Telephone Encounter (Signed)
Mild OSA with AHI 7/h. Given she is asymptomatic, we can manage this with side lying sleeping position and work on weight loss measures. I will upload a letter to her MyChart. She will have to bring any DOT clearance paperwork to the office if something else is required. Thanks.

## 2023-04-26 ENCOUNTER — Telehealth: Payer: Self-pay | Admitting: Neurology

## 2023-04-26 NOTE — Telephone Encounter (Signed)
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 Please see attached paperwork that she brought by. Best call back #   [phone]

## 2023-05-04 ENCOUNTER — Encounter: Payer: Self-pay | Admitting: Nurse Practitioner

## 2023-05-04 ENCOUNTER — Ambulatory Visit (INDEPENDENT_AMBULATORY_CARE_PROVIDER_SITE_OTHER): Payer: Medicare Other | Admitting: Nurse Practitioner

## 2023-05-04 VITALS — BP 124/76 | HR 80 | Temp 98.1°F | Ht 65.0 in | Wt 230.8 lb

## 2023-05-04 DIAGNOSIS — G4733 Obstructive sleep apnea (adult) (pediatric): Secondary | ICD-10-CM

## 2023-05-04 NOTE — Patient Instructions (Signed)
Good seeing you today! 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  You have very mild sleep apnea. Utilize a side lying sleeping position while you sleep and continue working on healthy weight management   Please call us if you need anything otherwise follow up in one year, or sooner, if needed

## 2023-05-04 NOTE — Progress Notes (Signed)
@Patient  ID: Mary [name], female    DOB: September 27, 1966, 57 y.o.   MRN: [id number]  Chief Complaint  Patient presents with   Follow-up    Hst review     Referring provider: Hoy Register, MD  HPI: 57 year old female, former smoker referred for sleep consult April 2024.  Past medical history significant for OSA not on CPAP, hypertension, history of stroke on AC, DM, antiphospholipid syndrome, anemia, IDA, status post gastric sleeve.  TEST/EVENTS:  04/13/2023 HST: AHI 7.1, SpO2 low 81%  04/06/2023: OV with Lennix Rotundo NP for sleep consult.  She has a history of OSA.  She tried CPAP in 2017 but had difficulty tolerating it and it was also expensive so she ended up sending it back to the company.  Has not been on any sort of therapy since and has not had a repeat sleep study.  She did have a gastric sleeve in March 2018 and lost about 70 pounds.  She no longer has any daytime sleepiness, drowsy driving or snoring from what she has been told.  She denies any morning headaches, bruxism, sleep parasomnia/paralysis.  No history of narcolepsy or symptoms of cataplexy.  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 Goes to bed around 8 PM.  Falls asleep within 30 minutes.  Wakes once at night.  Gets up around 4:30 AM.  Last sleep study was in 2017.  She does not have a copy of this. She has a history of high blood pressure and stroke.  She is on chronic anticoagulation therapy.  Blood pressure is well-controlled on antihypertensives.  She is a former smoker.  Quit in 2007.  Does not drink any alcohol.  No excessive caffeine intake.  Lives at home with her mother.  No significant family history.  05/04/2023: Today-follow-up Patient presents today for follow-up.  After her last visit, she had a sleep study that showed mild sleep apnea.  She is asymptomatic.  She has restful sleep at night and does not have any significant  daytime sleepiness.  We opted to move forward with side-lying sleeping position and continuing healthy weight management.  She was provided a letter for her DOT physical.  She is still having trouble getting this because of her Coumadin use.  She did receive clearance from her cardiologist.  We investigated this further during her visit.  I do not see anything where it specifically states that anticoagulation use automatically disqualifies her.  She is going to follow-up with the examiner and discuss further.  Otherwise throat respiratory standpoint, she has no concerns or complaints today.  No drowsy driving, sleep parasomnia/paralysis, morning headaches.  No Known Allergies  Immunization History  Administered Date(s) Administered   Influenza,inj,Quad PF,6+ Mos 08/29/2019   PPD Test 04/12/2018   Tdap 01/30/2017, 09/06/2021    Past Medical History:  Diagnosis Date   Blood transfusion without reported diagnosis    transfusion December 2016   Clotting disorder (HCC)    left leg   Diabetes mellitus without complication (HCC)    Hypertension    Other and unspecified ovarian cysts    Stroke (HCC)    Vaginal Pap smear, abnormal     Tobacco History: Social History   Tobacco Use  Smoking Status Former   Packs/day: 1.00   Years: 10.00   Additional pack years: 0.00   Total pack years: 10.00   Types: Cigarettes  Quit date: 07/14/2006   Years since quitting: 16.8  Smokeless Tobacco Never  Tobacco Comments   quit smoking around 2007   Counseling given: Not Answered Tobacco comments: quit smoking around 2007   Outpatient Medications Prior to Visit  Medication Sig Dispense Refill   amLODipine (NORVASC) 10 MG tablet Take 1 tablet (10 mg total) by mouth daily. 90 tablet 2   Apremilast (OTEZLA) 30 MG TABS Take 1 tablet (30 mg total) by mouth 2 (two) times daily. (Patient taking differently: Take 1 tablet by mouth daily.) 60 tablet 2   atorvastatin (LIPITOR) 40 MG tablet Take 1 tablet  (40 mg total) by mouth daily. 90 tablet 3   Calcipotriene-Betameth Diprop (WYNZORA) 0.005-0.064 % CREA Apply 1 application. topically as directed. Apply twice daily for two weeks then decrease once daily for 5 days. Avoid Face, groin and underarms. 60 g 0   Cholecalciferol (VITAMIN D-3) 25 MCG (1000 UT) CAPS Take by mouth.     cyanocobalamin 1000 MCG tablet Take 1,000 mcg by mouth daily.     fluticasone (FLONASE) 50 MCG/ACT nasal spray Place 2 sprays into both nostrils daily. (Patient taking differently: Place 2 sprays into both nostrils daily as needed for allergies or rhinitis.) 16 g 6   Iron, Ferrous Sulfate, 325 (65 Fe) MG TABS Take 325 mg by mouth daily. 60 tablet 3   loratadine (CLARITIN) 10 MG tablet Take 10 mg by mouth daily as needed for allergies or rhinitis.     metoprolol tartrate (LOPRESSOR) 25 MG tablet Take 1 tablet (25 mg total) by mouth 2 (two) times daily. 180 tablet 3   mometasone (ELOCON) 0.1 % cream APPLY TOPICALLY DAILY AS NEEDED FOR RASH. APPLY UP TO FIVE DAYS PER WEEK TO THE AFFECTED AREA AS NEEDED UNTIL IMPROVED. THEN DISCONTINUE 45 g 0   Na Sulfate-K Sulfate-Mg Sulf 17.5-3.13-1.6 GM/177ML SOLN      warfarin (COUMADIN) 5 MG tablet TAKE 1/2 TO 1 TABLET BY MOUTH ONCE DAILY OR AS DIRECTED 90 tablet 1   meclizine (ANTIVERT) 25 MG tablet Take 25 mg by mouth 3 (three) times daily as needed. (Patient not taking: Reported on 04/20/2023)     NON FORMULARY [employer] BRAND: STOOL SOFTNERS (Patient not taking: Reported on 05/04/2023)     Vitamin D, Ergocalciferol, (DRISDOL) 1.25 MG (50000 UNIT) CAPS capsule Take 1,250 Units by mouth every 7 (seven) days. (Patient not taking: Reported on 04/20/2023)     No facility-administered medications prior to visit.     Review of Systems:   Constitutional: No weight loss or gain, night sweats, fevers, chills, fatigue, or lassitude. HEENT: No headaches, difficulty swallowing, tooth/dental problems, or sore throat. No sneezing, itching, ear ache, nasal  congestion, or post nasal drip CV:  No chest pain, orthopnea, PND, swelling in lower extremities, anasarca, dizziness, palpitations, syncope Resp: No shortness of breath with exertion or at rest. No excess mucus or change in color of mucus. No productive or non-productive. No hemoptysis. No wheezing.  No chest wall deformity GI:  No heartburn, indigestion, abdominal pain, nausea, vomiting, diarrhea, change in bowel habits, loss of appetite, bloody stools.  GU: No dysuria, change in color of urine, urgency or frequency.  Skin: No rash, lesions, ulcerations MSK:  No joint pain or swelling.   Neuro: No dizziness or lightheadedness.  Psych: No depression or anxiety. Mood stable.     Physical Exam:  BP 124/76   Pulse 80   Temp 98.1 F (36.7 C) (Oral)   Ht 5\' 5"  (  1.651 m)   Wt 230 lb 12.8 oz (104.7 kg)   LMP 04/04/2019 (Approximate)   SpO2 96%   BMI 38.41 kg/m   GEN: Pleasant, interactive, well-appearing; obese; in no acute distress. HEENT:  Normocephalic and atraumatic. PERRLA. Sclera white. Nasal turbinates pink, moist and patent bilaterally. No rhinorrhea present. Oropharynx pink and moist, without exudate or edema. No lesions, ulcerations, or postnasal drip. Mallampati III NECK:  Supple w/ fair ROM. No JVD present. Normal carotid impulses w/o bruits. Thyroid symmetrical with no goiter or nodules palpated. No lymphadenopathy.   CV: RRR, no m/r/g, no peripheral edema. Pulses intact, +2 bilaterally. No cyanosis, pallor or clubbing. PULMONARY:  Unlabored, regular breathing. Clear bilaterally A&P w/o wheezes/rales/rhonchi. No accessory muscle use.  GI: BS present and normoactive. Soft, non-tender to palpation. No organomegaly or masses detected.  MSK: No erythema, warmth or tenderness. Cap refil <2 sec all extrem. No deformities or joint swelling noted.  Neuro: A/Ox3. No focal deficits noted.   Skin: Warm, no lesions or rashe Psych: Normal affect and behavior. Judgement and thought content  appropriate.     Lab Results:  CBC    Component Value Date/Time   WBC 4.9 02/22/2023 1218   WBC 6.9 09/06/2021 1827   RBC 4.94 02/22/2023 1218   RBC 4.96 09/06/2021 1827   HGB 11.3 02/22/2023 1218   HGB 10.0 (L) 04/17/2015 0948   HCT 35.4 02/22/2023 1218   HCT 32.1 (L) 04/17/2015 0948   PLT 280 02/22/2023 1218   MCV 72 (L) 02/22/2023 1218   MCV 66.0 (L) 04/17/2015 0948   MCH 22.9 (L) 02/22/2023 1218   MCH 23.2 (L) 09/06/2021 1827   MCHC 31.9 02/22/2023 1218   MCHC 31.5 09/06/2021 1827   RDW 14.8 02/22/2023 1218   RDW 18.2 (H) 04/17/2015 0948   LYMPHSABS 1.5 02/22/2023 1218   LYMPHSABS 1.8 04/17/2015 0948   MONOABS 0.4 03/30/2021 2248   MONOABS 0.3 04/17/2015 0948   EOSABS 0.3 02/22/2023 1218   EOSABS 0.1 10/03/2014 1004   BASOSABS 0.0 02/22/2023 1218   BASOSABS 0.1 04/17/2015 0948    BMET    Component Value Date/Time   NA 143 02/22/2023 1218   NA 140 10/03/2014 1004   K 3.6 02/22/2023 1218   K 3.6 10/03/2014 1004   CL 105 02/22/2023 1218   CL 106 10/03/2014 1004   CO2 25 11/08/2021 0840   CO2 29 10/03/2014 1004   GLUCOSE 141 (H) 02/22/2023 1218   GLUCOSE 95 09/16/2021 1401   GLUCOSE 100 (H) 10/03/2014 1004   BUN 10 02/22/2023 1218   BUN 9 10/03/2014 1004   CREATININE 1.06 (H) 02/22/2023 1218   CREATININE 0.75 01/30/2017 1135   CALCIUM 8.7 02/22/2023 1218   CALCIUM 8.2 (L) 10/03/2014 1004   GFRNONAA 49 (L) 09/16/2021 1401   GFRNONAA >89 01/30/2017 1135   GFRAA 67 03/25/2020 1020   GFRAA >89 01/30/2017 1135    BNP No results found for: "BNP"   Imaging:  No results found.        No data to display          No results found for: "NITRICOXIDE"      Assessment & Plan:   Sleep apnea Mild sleep apnea with AHI 7.1/h.  She does not have any significant daytime symptoms.  She will continue to work on healthy weight management and utilize a side-lying sleeping position.  Encouraged her to monitor her symptoms and if she were to develop  worsening daytime fatigue or  snoring at night, to let us know so we can repeat her sleep study.  Otherwise advised to use caution when driving.  Patient Instructions  Good seeing you today! 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  You have very mild sleep apnea. Utilize a side lying sleeping position while you sleep and continue working on healthy weight management   Please call us if you need anything otherwise follow up in one year, or sooner, if needed   I spent 35 minutes of dedicated to the care of this patient on the date of this encounter to include pre-visit review of records, face-to-face time with the patient discussing conditions above, post visit ordering of testing, clinical documentation with the electronic health record, making appropriate referrals as documented, and communicating necessary findings to members of the patients care team.  Noemi Chapel, NP 05/04/2023  Pt aware and understands NP's role.

## 2023-05-04 NOTE — Assessment & Plan Note (Signed)
Mild sleep apnea with AHI 7.1/h.  She does not have any significant daytime symptoms.  She will continue to work on healthy weight management and utilize a side-lying sleeping position.  Encouraged her to monitor her symptoms and if she were to develop worsening daytime fatigue or snoring at night, to let us know so we can repeat her sleep study.  Otherwise advised to use caution when driving.  Patient Instructions  Good seeing you today! 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  You have very mild sleep apnea. Utilize a side lying sleeping position while you sleep and continue working on healthy weight management   Please call us if you need anything otherwise follow up in one year, or sooner, if needed

## 2023-05-16 ENCOUNTER — Other Ambulatory Visit: Payer: Self-pay | Admitting: Cardiovascular Disease

## 2023-05-16 NOTE — Telephone Encounter (Signed)
Dr. Mariah Milling, pt seen by Cadence on 12/27/22.  Noted:  She is only taking Lopressor once day, so we will change this to Toprol 25mg  daily.   Rx for Toprol not sent to pharmacy.  Did you want to change or continue Metoprolol as already prescribed?

## 2023-05-17 ENCOUNTER — Ambulatory Visit: Payer: Medicare Other

## 2023-05-17 ENCOUNTER — Ambulatory Visit (INDEPENDENT_AMBULATORY_CARE_PROVIDER_SITE_OTHER): Payer: Medicare Other | Admitting: Dermatology

## 2023-05-17 VITALS — BP 136/82 | HR 76

## 2023-05-17 DIAGNOSIS — Z7189 Other specified counseling: Secondary | ICD-10-CM | POA: Diagnosis not present

## 2023-05-17 DIAGNOSIS — L409 Psoriasis, unspecified: Secondary | ICD-10-CM | POA: Diagnosis not present

## 2023-05-17 DIAGNOSIS — Z79899 Other long term (current) drug therapy: Secondary | ICD-10-CM | POA: Diagnosis not present

## 2023-05-17 NOTE — Progress Notes (Signed)
   Follow-Up Visit   Subjective  Mary [name] is a 57 y.o. female who presents for the following: Psoriasis 4 months, treating with Otezla 30 mg once a day with a good response. Patient report no side effects from [place] tablets No diarrhea, nausea, headache, upper respiratory infection, depression,    The following portions of the chart were reviewed this encounter and updated as appropriate: medications, allergies, medical history  Review of Systems:  No other skin or systemic complaints except as noted in HPI or Assessment and Plan.  Objective  Well appearing patient in no apparent distress; mood and affect are within normal limits.  Areas Examined: abdomen, inframammary    Relevant exam findings are noted in the Assessment and Plan.    Assessment & Plan   PSORIASIS Hyperpigmentation scattered on body-. 2% BSA.  Chronic and persistent condition with duration or expected duration over one year. Condition is symptomatic/ bothersome to patient. Not currently at goal. Improved on Otezla   Patient denies joint pain  Treatment Plan: Continue Otezla 30 mg daily 2 sample packets given  Lot -[id number] Exp- June 17 2025   Side effects of Otezla (apremilast) include diarrhea, nausea, headache, upper respiratory infection, depression, and weight decrease (5-10%). It should only be taken by pregnant women after a discussion regarding risks and benefits with their doctor. Goal is control of skin condition, not cure.  The use of [place] requires long term medication management, including periodic office visits.    Counseling on psoriasis and coordination of care  psoriasis is a chronic non-curable, but treatable genetic/hereditary disease that may have other systemic features affecting other organ systems such as joints (Psoriatic Arthritis). It is associated with an increased risk of inflammatory bowel disease, heart disease, non-alcoholic fatty liver disease, and depression.   Treatments include light and laser treatments; topical medications; and systemic medications including oral and injectables.    Return in about 6 months (around 11/17/2023) for Psoriasis.  IAngelique Holm, CMA, am acting as scribe for Armida Sans, MD .   Documentation: I have reviewed the above documentation for accuracy and completeness, and I agree with the above.  Armida Sans, MD

## 2023-05-17 NOTE — Patient Instructions (Signed)
Due to recent changes in healthcare laws, you may see results of your pathology and/or laboratory studies on MyChart before the doctors have had a chance to review them. We understand that in some cases there may be results that are confusing or concerning to you. Please understand that not all results are received at the same time and often the doctors may need to interpret multiple results in order to provide you with the best plan of care or course of treatment. Therefore, we ask that you please give us 2 business days to thoroughly review all your results before contacting the office for clarification. Should we see a critical lab result, you will be contacted sooner.   If You Need Anything After Your Visit  If you have any questions or concerns for your doctor, please call our main line at [phone] and press option 4 to reach your doctor's medical assistant. If no one answers, please leave a voicemail as directed and we will return your call as soon as possible. Messages left after 4 pm will be answered the following business day.   You may also send us a message via MyChart. We typically respond to MyChart messages within 1-2 business days.  For prescription refills, please ask your pharmacy to contact our office. Our fax number is [phone].  If you have an urgent issue when the clinic is closed that cannot wait until the next business day, you can page your doctor at the number below.    Please note that while we do our best to be available for urgent issues outside of office hours, we are not available 24/7.   If you have an urgent issue and are unable to reach us, you may choose to seek medical care at your doctor's office, retail clinic, urgent care center, or emergency room.  If you have a medical emergency, please immediately call 911 or go to the emergency department.  Pager Numbers  - Dr. Kowalski: [phone]  - Dr. Moye: [phone]  - Dr. Stewart:  [phone]  In the event of inclement weather, please call our main line at [phone] for an update on the status of any delays or closures.  Dermatology Medication Tips: Please keep the boxes that topical medications come in in order to help keep track of the instructions about where and how to use these. Pharmacies typically print the medication instructions only on the boxes and not directly on the medication tubes.   If your medication is too expensive, please contact our office at [phone] option 4 or send us a message through MyChart.   We are unable to tell what your co-pay for medications will be in advance as this is different depending on your insurance coverage. However, we may be able to find a substitute medication at lower cost or fill out paperwork to get insurance to cover a needed medication.   If a prior authorization is required to get your medication covered by your insurance company, please allow us 1-2 business days to complete this process.  Drug prices often vary depending on where the prescription is filled and some pharmacies may offer cheaper prices.  The website www.goodrx.com contains coupons for medications through different pharmacies. The prices here do not account for what the cost may be with help from insurance (it may be cheaper with your insurance), but the website can give you the price if you did not use any insurance.  - You can print the associated coupon and take it with   your prescription to the pharmacy.  - You may also stop by our office during regular business hours and pick up a GoodRx coupon card.  - If you need your prescription sent electronically to a different pharmacy, notify our office through [facility] MyChart or by phone at [phone] option 4.     Si Usted Necesita Algo Despus de Su Visita  Tambin puede enviarnos un mensaje a travs de MyChart. Por lo general respondemos a los mensajes de MyChart en el transcurso de 1 a 2  das hbiles.  Para renovar recetas, por favor pida a su farmacia que se ponga en contacto con nuestra oficina. Nuestro nmero de fax es el [phone].  Si tiene un asunto urgente cuando la clnica est cerrada y que no puede esperar hasta el siguiente da hbil, puede llamar/localizar a su doctor(a) al nmero que aparece a continuacin.   Por favor, tenga en cuenta que aunque hacemos todo lo posible para estar disponibles para asuntos urgentes fuera del horario de oficina, no estamos disponibles las 24 horas del da, los 7 das de la semana.   Si tiene un problema urgente y no puede comunicarse con nosotros, puede optar por buscar atencin mdica  en el consultorio de su doctor(a), en una clnica privada, en un centro de atencin urgente o en una sala de emergencias.  Si tiene una emergencia mdica, por favor llame inmediatamente al 911 o vaya a la sala de emergencias.  Nmeros de bper  - Dr. Kowalski: [phone]  - Dra. Moye: [phone]  - Dra. Stewart: [phone]  En caso de inclemencias del tiempo, por favor llame a nuestra lnea principal al [phone] para una actualizacin sobre el estado de cualquier retraso o cierre.  Consejos para la medicacin en dermatologa: Por favor, guarde las cajas en las que vienen los medicamentos de uso tpico para ayudarle a seguir las instrucciones sobre dnde y cmo usarlos. Las farmacias generalmente imprimen las instrucciones del medicamento slo en las cajas y no directamente en los tubos del medicamento.   Si su medicamento es muy caro, por favor, pngase en contacto con nuestra oficina llamando al [phone] y presione la opcin 4 o envenos un mensaje a travs de MyChart.   No podemos decirle cul ser su copago por los medicamentos por adelantado ya que esto es diferente dependiendo de la cobertura de su seguro. Sin embargo, es posible que podamos encontrar un medicamento sustituto a menor costo o llenar un formulario para que el  seguro cubra el medicamento que se considera necesario.   Si se requiere una autorizacin previa para que su compaa de seguros cubra su medicamento, por favor permtanos de 1 a 2 das hbiles para completar este proceso.  Los precios de los medicamentos varan con frecuencia dependiendo del lugar de dnde se surte la receta y alguna farmacias pueden ofrecer precios ms baratos.  El sitio web www.goodrx.com tiene cupones para medicamentos de diferentes farmacias. Los precios aqu no tienen en cuenta lo que podra costar con la ayuda del seguro (puede ser ms barato con su seguro), pero el sitio web puede darle el precio si no utiliz ningn seguro.  - Puede imprimir el cupn correspondiente y llevarlo con su receta a la farmacia.  - Tambin puede pasar por nuestra oficina durante el horario de atencin regular y recoger una tarjeta de cupones de GoodRx.  - Si necesita que su receta se enve electrnicamente a una farmacia diferente, informe a nuestra oficina a travs de MyChart de [facility]   o por telfono llamando al [phone] y presione la opcin 4.  

## 2023-05-18 NOTE — Telephone Encounter (Signed)
Cadence, could you review and advise on medication?  Thanks!

## 2023-05-22 NOTE — Telephone Encounter (Signed)
Ghislaine, could you look into this and confirm how MD would like to proceed with refill?  Thanks!

## 2023-05-23 ENCOUNTER — Encounter: Payer: Self-pay | Admitting: Neurology

## 2023-05-23 ENCOUNTER — Ambulatory Visit (INDEPENDENT_AMBULATORY_CARE_PROVIDER_SITE_OTHER): Payer: Medicare Other | Admitting: Neurology

## 2023-05-23 ENCOUNTER — Other Ambulatory Visit: Payer: Self-pay | Admitting: Nurse Practitioner

## 2023-05-23 VITALS — BP 126/84 | HR 73 | Ht 65.0 in | Wt 230.5 lb

## 2023-05-23 DIAGNOSIS — I63412 Cerebral infarction due to embolism of left middle cerebral artery: Secondary | ICD-10-CM

## 2023-05-23 DIAGNOSIS — I10 Essential (primary) hypertension: Secondary | ICD-10-CM | POA: Diagnosis not present

## 2023-05-23 DIAGNOSIS — R4789 Other speech disturbances: Secondary | ICD-10-CM | POA: Diagnosis not present

## 2023-05-23 DIAGNOSIS — G4733 Obstructive sleep apnea (adult) (pediatric): Secondary | ICD-10-CM

## 2023-05-23 DIAGNOSIS — R488 Other symbolic dysfunctions: Secondary | ICD-10-CM | POA: Diagnosis not present

## 2023-05-23 NOTE — Telephone Encounter (Signed)
Called patient and confirmed that she continues to take Metoprolol Tartrate BID with no concerns.Marland Kitchen

## 2023-05-23 NOTE — Telephone Encounter (Signed)
Medication Refill - Medication: metoprolol tartrate (LOPRESSOR) 25 MG tablet  Has the patient contacted their pharmacy? Yes.   Reach out to PCP.   Preferred Pharmacy (with phone number or street name): [facility] [place], [place] - [address]  Phone: [phone] Fax: [phone]  Has the patient been seen for an appointment in the last year OR does the patient have an upcoming appointment? Yes.    Agent: Please be advised that RX refills may take up to 3 business days. We ask that you follow-up with your pharmacy.

## 2023-05-23 NOTE — Progress Notes (Signed)
[facility]  PATIENT: Mary [name] DOB: 09/01/1966  REFERRING DOCTOR OR PCP:  Dr. Alvis Lemmings  SOURCE: patient, notes from ED, imaging and lab reports, CT and MRI images personally reviewed.  _________________________________   HISTORICAL  CHIEF COMPLAINT:  Chief Complaint  Patient presents with   Follow-up    Pt in room 10. 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 Pt bought paperwork with her from DOT. Pt said her left foot hurts feel sore comes and goes, today her foot feel fine. Pt said left foot does feel numb at times, pt reports hard to walk in morning at times. Saw eye doctor 2 months ago normal exam.     HISTORY OF PRESENT ILLNESS:  I had the pleasure seeing your patient, Mary [name], at [facility] for neurologic consultation regarding her history of strokes and more recent left leg numbness.  She is a 57 y.o. woman with history of stroke in 2014 associated with aphasia and a second stroke in 2015.    She was found to have the presence of lupus anticoagulant with antiphospholipid syndrome and she was placed on warfarin.   Since the stroke in 2014, she has mild aphasia, she is unable to read and to math.   These symptoms were all worse at the time of the stroke and improved a bit.     She improved and is able to work.  She is asking about DOT but has fixed deficits after the stroke  I saw her for a single visit in 2020 when she had an episode of presyncope and reduced responsiveness.  The evaluation showed no acute findings on MRI or MRA.Marland Kitchen  She has numbness in her right foot x 1 month  This is not painful   It comes and goes and is often present upon awakening.     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   She stopped driving for a while but now will drive on smaller roads.    She is unable to drive for work as on a warfarin.    The anticoagulant is followed by the cardiology clinic with monthly PT.       She has mild OSA with AHI=7.1 on sleep test.    Stroke History She had a CVA 01/23/2013 (speech difficulty, expressive aphasia due to left parietal CVA - CTA showed an MCA branch occlusion).   Rest of evaluation normal except antiphospholipid.  She also has a history of DVT and is on warfarin.     She had a loop recorder x 2-3 years and had no atrial fibrillation.   Her aphasia greatly improved but she continues to have mild fluency issues.  She never had any symptoms associated with the ACA distribution stroke.    CT scan from 2015 also showed she had a second stroke in the right frontal lobe.    She had an episode of presyncope associated with aphasia 12/16/2019.   She was standing and felt very lightheaded while at work lasting 5-10 minutes. She was standing and sat down and she also noted spinning sensation.   She could hear people but could not respond.  After 5-10 minutes she quickly improved to baseline.   Ct showed chronic stroke and she also had MRI and it showed chronic right ACA and left MCA.  She also has some small chronic lacunar on the BG and corona radiata.     PT was therapeutic at 25.7 and  INR 2.4 and PTT 59.       IMAGING CT and MRI scans of the brain performed on 12/16/2019.   There are chronic large vessel infarctions involving the left parietal lobe (MCA branch) and the anterior right frontal lobe (right ACA).   Additionally, there are scattered lacunar infarctions in chronic microvascular ischemic change.  There were no acute findings.  CT scans from 2014 and 2015 were also reviewed.  The CT scan from 2014 showed the parietal stroke.  The scan from 2015 showed interval development of the right frontal stroke     REVIEW OF SYSTEMS: Constitutional: No fevers, chills, sweats, or change in appetite Eyes: No visual changes, double vision, eye pain Ear, nose and throat: No hearing loss, ear pain, nasal congestion, sore throat Cardiovascular: No chest pain,  palpitations Respiratory:  No shortness of breath at rest or with exertion.   No wheezes.  She has OSA that is untreated. GastrointestinaI: No nausea, vomiting, diarrhea, abdominal pain, fecal incontinence Genitourinary:  No dysuria, urinary retention or frequency.  No nocturia. Musculoskeletal:  No neck pain, back pain Integumentary: No rash, pruritus, skin lesions Neurological: as above Psychiatric: No depression at this time.  No anxiety Endocrine: No palpitations, diaphoresis, change in appetite, change in weigh or increased thirst Hematologic/Lymphatic:  No anemia, purpura, petechiae. Allergic/Immunologic: No itchy/runny eyes, nasal congestion, recent allergic reactions, rashes  ALLERGIES: No Known Allergies  HOME MEDICATIONS:  Current Outpatient Medications:    amLODipine (NORVASC) 10 MG tablet, Take 1 tablet (10 mg total) by mouth daily., Disp: 90 tablet, Rfl: 2   Apremilast (OTEZLA) 30 MG TABS, Take 1 tablet (30 mg total) by mouth 2 (two) times daily. (Patient taking differently: Take 1 tablet by mouth daily.), Disp: 60 tablet, Rfl: 2   atorvastatin (LIPITOR) 40 MG tablet, Take 1 tablet (40 mg total) by mouth daily., Disp: 90 tablet, Rfl: 3   Calcipotriene-Betameth Diprop (WYNZORA) 0.005-0.064 % CREA, Apply 1 application. topically as directed. Apply twice daily for two weeks then decrease once daily for 5 days. Avoid Face, groin and underarms., Disp: 60 g, Rfl: 0   Cholecalciferol (VITAMIN D-3) 25 MCG (1000 UT) CAPS, Take by mouth., Disp: , Rfl:    cyanocobalamin 1000 MCG tablet, Take 1,000 mcg by mouth daily., Disp: , Rfl:    fluticasone (FLONASE) 50 MCG/ACT nasal spray, Place 2 sprays into both nostrils daily. (Patient taking differently: Place 2 sprays into both nostrils daily as needed for allergies or rhinitis.), Disp: 16 g, Rfl: 6   Iron, Ferrous Sulfate, 325 (65 Fe) MG TABS, Take 325 mg by mouth daily., Disp: 60 tablet, Rfl: 3   loratadine (CLARITIN) 10 MG tablet, Take 10 mg  by mouth daily as needed for allergies or rhinitis., Disp: , Rfl:    meclizine (ANTIVERT) 25 MG tablet, Take 25 mg by mouth 3 (three) times daily as needed., Disp: , Rfl:    metoprolol tartrate (LOPRESSOR) 25 MG tablet, Take 1 tablet (25 mg total) by mouth 2 (two) times daily., Disp: 180 tablet, Rfl: 3   mometasone (ELOCON) 0.1 % cream, APPLY TOPICALLY DAILY AS NEEDED FOR RASH. APPLY UP TO FIVE DAYS PER WEEK TO THE AFFECTED AREA AS NEEDED UNTIL IMPROVED. THEN DISCONTINUE, Disp: 45 g, Rfl: 0   Na Sulfate-K Sulfate-Mg Sulf 17.5-3.13-1.6 GM/177ML SOLN, , Disp: , Rfl:    NON FORMULARY, [employer] BRAND: STOOL SOFTNERS, Disp: , Rfl:    Vitamin D, Ergocalciferol, (DRISDOL) 1.25 MG (50000 UNIT) CAPS capsule, Take 1,250 Units by  mouth every 7 (seven) days., Disp: , Rfl:    warfarin (COUMADIN) 5 MG tablet, TAKE 1/2 TO 1 TABLET BY MOUTH ONCE DAILY OR AS DIRECTED, Disp: 90 tablet, Rfl: 1  PAST MEDICAL HISTORY: Past Medical History:  Diagnosis Date   Blood transfusion without reported diagnosis    transfusion December 2016   Clotting disorder (HCC)    left leg   Diabetes mellitus without complication (HCC)    Hypertension    Other and unspecified ovarian cysts    Stroke (HCC)    Vaginal Pap smear, abnormal     PAST SURGICAL HISTORY: Past Surgical History:  Procedure Laterality Date   CLOSED REDUCTION CLAVICULAR Left 09/16/2021   Procedure: left shoulder sternoclavicular joint reduction;  Surgeon: Cammy Copa, MD;  Location: [facility];  Service: Orthopedics;  Laterality: Left;   COLONOSCOPY WITH PROPOFOL N/A 11/10/2015   Procedure: COLONOSCOPY WITH PROPOFOL;  Surgeon: Midge Minium, MD;  Location: [facility] ENDOSCOPY;  Service: Endoscopy;  Laterality: N/A;   COLONOSCOPY WITH PROPOFOL N/A 01/28/2022   Procedure: COLONOSCOPY WITH PROPOFOL;  Surgeon: Toney Reil, MD;  Location: [facility] ENDOSCOPY;  Service: Gastroenterology;  Laterality: N/A;   ESOPHAGOGASTRODUODENOSCOPY (EGD) WITH PROPOFOL N/A  11/10/2015   Procedure: ESOPHAGOGASTRODUODENOSCOPY (EGD) WITH PROPOFOL;  Surgeon: Midge Minium, MD;  Location: [facility] ENDOSCOPY;  Service: Endoscopy;  Laterality: N/A;   ESOPHAGOGASTRODUODENOSCOPY (EGD) WITH PROPOFOL N/A 01/28/2022   Procedure: ESOPHAGOGASTRODUODENOSCOPY (EGD) WITH PROPOFOL;  Surgeon: Toney Reil, MD;  Location: [facility] ENDOSCOPY;  Service: Gastroenterology;  Laterality: N/A;   LAPAROSCOPIC GASTRIC SLEEVE RESECTION  02/17/2016   placement   LOOP RECORDER IMPLANT N/A 04/25/2014   Procedure: LOOP RECORDER IMPLANT;  Surgeon: Gardiner Rhyme, MD;  Location: [facility] CATH LAB;  Service: Cardiovascular;  Laterality: N/A;   LOOP RECORDER REMOVAL N/A 02/01/2018   Procedure: LOOP RECORDER REMOVAL;  Surgeon: Duke Salvia, MD;  Location: [facility] INVASIVE CV LAB;  Service: Cardiovascular;  Laterality: N/A;   OVARIAN CYST REMOVAL     TEE WITHOUT CARDIOVERSION N/A 01/25/2013   Procedure: TRANSESOPHAGEAL ECHOCARDIOGRAM (TEE);  Surgeon: Ricki Rodriguez, MD;  Location: [facility] ENDOSCOPY;  Service: Cardiovascular;  Laterality: N/A;    FAMILY HISTORY: Family History  Problem Relation Age of Onset   Diabetes Mellitus II Mother    Diabetes Mother    Stroke Mother    Transient ischemic attack Father    Stroke Father    Stroke Other    Diabetes Other    Diabetes Sister     SOCIAL HISTORY:  Social History   Socioeconomic History   Marital status: Single    Spouse name: Not on file   Number of children: 2   Years of education: 13   Highest education level: Not on file  Occupational History   Not on file  Tobacco Use   Smoking status: Former    Packs/day: 1.00    Years: 10.00    Additional pack years: 0.00    Total pack years: 10.00    Types: Cigarettes    Quit date: 07/14/2006    Years since quitting: 16.8   Smokeless tobacco: Never   Tobacco comments:    quit smoking around 2007  Vaping Use   Vaping Use: Never used  Substance and Sexual Activity   Alcohol use: No    Alcohol/week: 0.0  standard drinks of alcohol   Drug use: No   Sexual activity: Yes  Other Topics Concern   Not on file  Social History Narrative   **  Merged History Encounter **       Patient is single with two children. Patient is right handed. Patient has 13 yrs of education. Patient drinks 5 sodas daily.   Social Determinants of Health   Financial Resource Strain: Low Risk  (04/20/2023)   Overall Financial Resource Strain (CARDIA)    Difficulty of Paying Living Expenses: Not hard at all  Food Insecurity: No Food Insecurity (04/20/2023)   Hunger Vital Sign    Worried About Running Out of Food in the Last Year: Never true    Ran Out of Food in the Last Year: Never true  Transportation Needs: No Transportation Needs (04/20/2023)   PRAPARE - Administrator, Civil Service (Medical): No    Lack of Transportation (Non-Medical): No  Physical Activity: Inactive (04/20/2023)   Exercise Vital Sign    Days of Exercise per Week: 0 days    Minutes of Exercise per Session: 0 min  Stress: No Stress Concern Present (04/20/2023)   [employer] of Occupational Health - Occupational Stress Questionnaire    Feeling of Stress : Only a little  Social Connections: Not on file  Intimate Partner Violence: Not on file     PHYSICAL EXAM  Vitals:   05/23/23 0833  BP: 126/84  Pulse: 73  Weight: 230 lb 8 oz (104.6 kg)  Height: 5\' 5"  (1.651 m)    Body mass index is 38.36 kg/m.   General: The patient is well-developed and well-nourished and in no acute distress  HEENT:  Head is [place]/AT.  Sclera are anicteric.    Neck: No carotid bruits are noted.  The neck is nontender.  Cardiovascular: The heart has a regular rate and rhythm with a normal S1 and S2. There were no murmurs, gallops or rubs.    Skin: Extremities are without rash or  edema.  Musculoskeletal:  Back is nontender  Neurologic Exam  Mental status: The patient is alert and oriented x 3 at the time of the examination. The patient has  apparent normal recent and remote memory, with an apparently normal attention span and concentration ability.  Mild word finding difficulty and hesitancy.   She cannot do simple calculations, also impaired left right discrimination.  Cranial nerves: Extraocular movements are full. Pupils are equal, round, and reactive to light and accomodation.  Visual fields are full.  Facial symmetry is present. There is good facial sensation to soft touch bilaterally.Facial strength is normal.  Trapezius and sternocleidomastoid strength is normal. No dysarthria is noted.  The tongue is midline, and the patient has symmetric elevation of the soft palate. No obvious hearing deficits are noted.  Motor:  Muscle bulk is normal.   Tone is normal. Strength is  5 / 5 in all 4 extremities.   Sensory: Sensory testing is intact to pinprick, soft touch and vibration sensation in all 4 extremities.  Coordination: Cerebellar testing reveals good finger-nose-finger and heel-to-shin bilaterally.  Gait and station: Station is normal.   Gait is slightly arthritic. Tandem gait is mildly wide.  Romberg is negative.   Reflexes: Deep tendon reflexes are symmetric and normal bilaterally.     DIAGNOSTIC DATA (LABS, IMAGING, TESTING) - I reviewed patient records, labs, notes, testing and imaging myself where available.  Lab Results  Component Value Date   WBC 4.9 02/22/2023   HGB 11.3 02/22/2023   HCT 35.4 02/22/2023   MCV 72 (L) 02/22/2023   PLT 280 02/22/2023      Component Value Date/Time  NA 143 02/22/2023 1218   NA 140 10/03/2014 1004   K 3.6 02/22/2023 1218   K 3.6 10/03/2014 1004   CL 105 02/22/2023 1218   CL 106 10/03/2014 1004   CO2 25 11/08/2021 0840   CO2 29 10/03/2014 1004   GLUCOSE 141 (H) 02/22/2023 1218   GLUCOSE 95 09/16/2021 1401   GLUCOSE 100 (H) 10/03/2014 1004   BUN 10 02/22/2023 1218   BUN 9 10/03/2014 1004   CREATININE 1.06 (H) 02/22/2023 1218   CREATININE 0.75 01/30/2017 1135   CALCIUM  8.7 02/22/2023 1218   CALCIUM 8.2 (L) 10/03/2014 1004   PROT 7.3 02/22/2023 1218   PROT 8.3 (H) 10/03/2014 1004   ALBUMIN 3.9 02/22/2023 1218   ALBUMIN 3.1 (L) 10/03/2014 1004   AST 19 02/22/2023 1218   AST 28 10/03/2014 1004   ALT 20 09/06/2021 1827   ALT 25 10/03/2014 1004   ALKPHOS 110 02/22/2023 1218   ALKPHOS 76 10/03/2014 1004   BILITOT 0.3 02/22/2023 1218   BILITOT 0.3 10/03/2014 1004   GFRNONAA 49 (L) 09/16/2021 1401   GFRNONAA >89 01/30/2017 1135   GFRAA 67 03/25/2020 1020   GFRAA >89 01/30/2017 1135   Lab Results  Component Value Date   CHOL 129 04/01/2021   HDL 37 (L) 04/01/2021   LDLCALC 70 04/01/2021   TRIG 121 04/01/2021   CHOLHDL 3.5 04/01/2021   Lab Results  Component Value Date   HGBA1C 6.4 (H) 02/22/2023   Lab Results  Component Value Date   VITAMINB12 408 04/27/2022   Lab Results  Component Value Date   TSH 1.190 02/22/2023       ASSESSMENT AND PLAN  Cerebral infarction due to embolism of left middle cerebral artery (HCC)  Gerstmann syndrome  Dysfluency  Essential hypertension  Obstructive sleep apnea syndrome  In summary, Ms. Oberbroeckling is a 57 year old woman who had strokes in 2014 and 2015 due to antiphospholipid syndrome and remains on anticoagulant therapy.  Although she had some symptoms in 2020 with presyncope she is not having those spells currently.  She does note some intermittent numbness in the left foot.  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 I showed her the actual pictures of the stroke to help her understand that she has fixed deficits due to the large parietal stroke on the left.  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  She also has difficulty with reading and fairly mild aphasia from adjacent areas.  The small  stroke in the right frontal lobe does not have any definite symptoms associated with it She needs to continue the Coumadin for the antiphospholipid syndrome to help prevent future strokes. Because the symptoms of the left foot are intermittent and mild, I do not recommend further evaluation at this time.  If symptoms worsen she will let us know and I would consider NCV/EMG or MRI to further evaluate Stay active and exercise as tolerated.   She also has mild obstructive sleep apnea.  Advised to try to lose weight. She will return to see me as needed if she has new or worsening neurologic symptoms.  Thank you for asking me to see Ms. Juliana.  Please let me know if I can be of further assistance with her or other patients in the future.  Younes Degeorge A. Epimenio Foot, MD, [facility] 05/23/2023, 9:30  AM Certified in Neurology, Clinical Neurophysiology, Sleep Medicine and Neuroimaging  [facility] [address], [address] [place], [place] [address] [phone]

## 2023-05-24 ENCOUNTER — Encounter: Payer: Self-pay | Admitting: Dermatology

## 2023-05-24 ENCOUNTER — Ambulatory Visit: Payer: Medicare Other | Attending: Cardiovascular Disease

## 2023-05-24 DIAGNOSIS — Z5181 Encounter for therapeutic drug level monitoring: Secondary | ICD-10-CM | POA: Diagnosis not present

## 2023-05-24 DIAGNOSIS — I639 Cerebral infarction, unspecified: Secondary | ICD-10-CM | POA: Diagnosis not present

## 2023-05-24 DIAGNOSIS — Z8673 Personal history of transient ischemic attack (TIA), and cerebral infarction without residual deficits: Secondary | ICD-10-CM | POA: Diagnosis not present

## 2023-05-24 LAB — POCT INR: INR: 2.4 (ref 2.0–3.0)

## 2023-05-24 NOTE — Patient Instructions (Signed)
TAKE 1 TABLET TODAY ONLY THEN Continue on same dosage of Warfarin 1 tablet daily except 1/2 tablet on Mondays, Wednesdays and Fridays.  Recheck INR in 4 weeks.

## 2023-06-12 ENCOUNTER — Ambulatory Visit: Payer: Medicare Other | Admitting: Family Medicine

## 2023-06-16 ENCOUNTER — Telehealth: Payer: Self-pay | Admitting: Cardiovascular Disease

## 2023-06-16 NOTE — Telephone Encounter (Signed)
Form placed in Dr. Windell Hummingbird office to be signed.

## 2023-06-16 NOTE — Telephone Encounter (Signed)
Patient dropped off DOT letter for completion. Placed in nurse box.

## 2023-06-21 ENCOUNTER — Ambulatory Visit: Payer: Medicare Other | Attending: Cardiovascular Disease

## 2023-06-21 DIAGNOSIS — I639 Cerebral infarction, unspecified: Secondary | ICD-10-CM

## 2023-06-21 DIAGNOSIS — Z8673 Personal history of transient ischemic attack (TIA), and cerebral infarction without residual deficits: Secondary | ICD-10-CM

## 2023-06-21 DIAGNOSIS — Z5181 Encounter for therapeutic drug level monitoring: Secondary | ICD-10-CM

## 2023-06-21 LAB — POCT INR: INR: 2.9 (ref 2.0–3.0)

## 2023-06-21 NOTE — Telephone Encounter (Signed)
Patient is calling to get update on forms and when to pick up. Please advise.

## 2023-06-21 NOTE — Patient Instructions (Signed)
Continue on same dosage of Warfarin 1 tablet daily except 1/2 tablet on Mondays, Wednesdays and Fridays.  Recheck INR in 4 weeks. 

## 2023-06-21 NOTE — Telephone Encounter (Signed)
Called and spoke with patient. Informed her that the form has not been completed at this time but that I will call her when it has been completed. Patient verbalizes understanding.

## 2023-06-26 NOTE — Telephone Encounter (Signed)
Called patient on 06/23/23 and notified her that the forms had been completed and that they would be placed in the office for pick up. Patient verbalized understanding.

## 2023-06-28 ENCOUNTER — Ambulatory Visit: Payer: Medicare Other | Admitting: Dermatology

## 2023-06-28 ENCOUNTER — Ambulatory Visit: Payer: Medicare Other | Admitting: Nurse Practitioner

## 2023-07-12 ENCOUNTER — Ambulatory Visit: Payer: Medicare Other | Attending: Family Medicine | Admitting: Family Medicine

## 2023-07-12 ENCOUNTER — Other Ambulatory Visit: Payer: Self-pay | Admitting: Medical

## 2023-07-12 ENCOUNTER — Encounter: Payer: Self-pay | Admitting: Family Medicine

## 2023-07-12 VITALS — BP 118/74 | HR 77 | Ht 65.0 in | Wt 231.0 lb

## 2023-07-12 DIAGNOSIS — E1159 Type 2 diabetes mellitus with other circulatory complications: Secondary | ICD-10-CM | POA: Diagnosis not present

## 2023-07-12 DIAGNOSIS — L409 Psoriasis, unspecified: Secondary | ICD-10-CM | POA: Diagnosis not present

## 2023-07-12 DIAGNOSIS — Z8673 Personal history of transient ischemic attack (TIA), and cerebral infarction without residual deficits: Secondary | ICD-10-CM

## 2023-07-12 DIAGNOSIS — I152 Hypertension secondary to endocrine disorders: Secondary | ICD-10-CM | POA: Diagnosis not present

## 2023-07-12 DIAGNOSIS — I10 Essential (primary) hypertension: Secondary | ICD-10-CM

## 2023-07-12 LAB — POCT GLYCOSYLATED HEMOGLOBIN (HGB A1C): HbA1c, POC (controlled diabetic range): 6.1 % (ref 0.0–7.0)

## 2023-07-12 MED ORDER — METOPROLOL TARTRATE 25 MG PO TABS
25.0000 mg | ORAL_TABLET | Freq: Two times a day (BID) | ORAL | 2 refills | Status: DC
Start: 2023-07-12 — End: 2024-10-23

## 2023-07-12 MED ORDER — ATORVASTATIN CALCIUM 40 MG PO TABS
40.0000 mg | ORAL_TABLET | Freq: Every day | ORAL | 1 refills | Status: DC
Start: 2023-07-12 — End: 2023-11-15

## 2023-07-12 NOTE — Progress Notes (Signed)
Subjective:  Patient ID: Mary [name], female    DOB: 12/14/1966  Age: 57 y.o. MRN: [id number]  CC: Diabetes   HPI Mary [name] is a 57 y.o. year old female with a history of type 2 diabetes mellitus (diet controlled with A1c of 6.1) antiphospholipid syndrome (on chronic anticoagulation with Coumadin, followed by the Coumadin clinic), previous stroke (with residual deficits of language, calculation and processing) hypertension.  Interval History: Discussed the use of AI scribe software for clinical note transcription with the patient, who gave verbal consent to proceed.  She reports dissatisfaction with her current job at [employer] and is considering quitting due to loss of disability benefits. She has been without benefits for approximately eight months, which has been a significant source of stress.  She continues to follow up with her neurologist, Dr. Epimenio Foot, and the Cardiology Coumadin clinic. She is compliant with her antihypertensive, including amlodipine and metoprolol. Endorses adherence with Coumadin due to history of antiphospholipid syndrome . She has been inconsistent with her atorvastatin, taking it "here and there." She also takes vitamin D, iron pills, and vitamin B12 regularly.   The patient has been managing her diabetes well, with a recent A1c of 6.1, without the use of medication. She reports watching her diet and was previously exercising at work, but has recently switched positions and is not as active.  She has been seeing a dermatologist for psoriasis, which is located in her hair and under her chest. She is using a prescribed cream for management.  The patient also reports occasional numbness in her feet and cramping, which she suspects may be due to arthritis. She admits she may not be drinking enough water.        Past Medical History:  Diagnosis Date   Blood transfusion without reported diagnosis    transfusion December 2016   Clotting disorder [facility])     left leg   Diabetes mellitus without complication (HCC)    Hypertension    Other and unspecified ovarian cysts    Stroke [facility])    Vaginal Pap smear, abnormal     Past Surgical History:  Procedure Laterality Date   CLOSED REDUCTION CLAVICULAR Left 09/16/2021   Procedure: left shoulder sternoclavicular joint reduction;  Surgeon: Cammy Copa, MD;  Location: [facility] OR;  Service: Orthopedics;  Laterality: Left;   COLONOSCOPY WITH PROPOFOL N/A 11/10/2015   Procedure: COLONOSCOPY WITH PROPOFOL;  Surgeon: Midge Minium, MD;  Location: [facility] ENDOSCOPY;  Service: Endoscopy;  Laterality: N/A;   COLONOSCOPY WITH PROPOFOL N/A 01/28/2022   Procedure: COLONOSCOPY WITH PROPOFOL;  Surgeon: Toney Reil, MD;  Location: [facility] ENDOSCOPY;  Service: Gastroenterology;  Laterality: N/A;   ESOPHAGOGASTRODUODENOSCOPY (EGD) WITH PROPOFOL N/A 11/10/2015   Procedure: ESOPHAGOGASTRODUODENOSCOPY (EGD) WITH PROPOFOL;  Surgeon: Midge Minium, MD;  Location: [facility] ENDOSCOPY;  Service: Endoscopy;  Laterality: N/A;   ESOPHAGOGASTRODUODENOSCOPY (EGD) WITH PROPOFOL N/A 01/28/2022   Procedure: ESOPHAGOGASTRODUODENOSCOPY (EGD) WITH PROPOFOL;  Surgeon: Toney Reil, MD;  Location: [facility] ENDOSCOPY;  Service: Gastroenterology;  Laterality: N/A;   LAPAROSCOPIC GASTRIC SLEEVE RESECTION  02/17/2016   placement   LOOP RECORDER IMPLANT N/A 04/25/2014   Procedure: LOOP RECORDER IMPLANT;  Surgeon: Gardiner Rhyme, MD;  Location: [facility] CATH LAB;  Service: Cardiovascular;  Laterality: N/A;   LOOP RECORDER REMOVAL N/A 02/01/2018   Procedure: LOOP RECORDER REMOVAL;  Surgeon: Duke Salvia, MD;  Location: [facility] INVASIVE CV LAB;  Service: Cardiovascular;  Laterality: N/A;   OVARIAN CYST REMOVAL  TEE WITHOUT CARDIOVERSION N/A 01/25/2013   Procedure: TRANSESOPHAGEAL ECHOCARDIOGRAM (TEE);  Surgeon: Ricki Rodriguez, MD;  Location: [facility] ENDOSCOPY;  Service: Cardiovascular;  Laterality: N/A;    Family History  Problem Relation Age of Onset    Diabetes Mellitus II Mother    Diabetes Mother    Stroke Mother    Transient ischemic attack Father    Stroke Father    Stroke Other    Diabetes Other    Diabetes Sister     Social History   Socioeconomic History   Marital status: Single    Spouse name: Not on file   Number of children: 2   Years of education: 13   Highest education level: Some college, no degree  Occupational History   Not on file  Tobacco Use   Smoking status: Former    Current packs/day: 0.00    Average packs/day: 1 pack/day for 10.0 years (10.0 ttl pk-yrs)    Types: Cigarettes    Start date: 07/14/1996    Quit date: 07/14/2006    Years since quitting: 17.0   Smokeless tobacco: Never   Tobacco comments:    quit smoking around 2007  Vaping Use   Vaping status: Never Used  Substance and Sexual Activity   Alcohol use: No    Alcohol/week: 0.0 standard drinks of alcohol   Drug use: No   Sexual activity: Yes  Other Topics Concern   Not on file  Social History Narrative   ** Merged History Encounter **       Patient is single with two children. Patient is right handed. Patient has 13 yrs of education. Patient drinks 5 sodas daily.   Social Determinants of Health   Financial Resource Strain: High Risk (07/11/2023)   Overall Financial Resource Strain (CARDIA)    Difficulty of Paying Living Expenses: Hard  Food Insecurity: Food Insecurity Present (07/11/2023)   Hunger Vital Sign    Worried About Running Out of Food in the Last Year: Often true    Ran Out of Food in the Last Year: Never true  Transportation Needs: No Transportation Needs (07/11/2023)   PRAPARE - Administrator, Civil Service (Medical): No    Lack of Transportation (Non-Medical): No  Physical Activity: Sufficiently Active (07/11/2023)   Exercise Vital Sign    Days of Exercise per Week: 3 days    Minutes of Exercise per Session: 60 min  Recent Concern: Physical Activity - Inactive (04/20/2023)   Exercise Vital Sign    Days  of Exercise per Week: 0 days    Minutes of Exercise per Session: 0 min  Stress: Stress Concern Present (07/11/2023)   [employer] of Occupational Health - Occupational Stress Questionnaire    Feeling of Stress : Very much  Social Connections: Moderately Integrated (07/11/2023)   Social Connection and Isolation Panel [NHANES]    Frequency of Communication with Friends and Family: Three times a week    Frequency of Social Gatherings with Friends and Family: Once a week    Attends Religious Services: More than 4 times per year    Active Member of [employer] or Organizations: Yes    Attends Engineer, structural: More than 4 times per year    Marital Status: Never married    No Known Allergies  Outpatient Medications Prior to Visit  Medication Sig Dispense Refill   amLODipine (NORVASC) 10 MG tablet Take 1 tablet (10 mg total) by mouth daily. 90 tablet 2   Apremilast (OTEZLA)  30 MG TABS Take 1 tablet (30 mg total) by mouth 2 (two) times daily. (Patient taking differently: Take 1 tablet by mouth daily.) 60 tablet 2   Cholecalciferol (VITAMIN D-3) 25 MCG (1000 UT) CAPS Take by mouth.     Iron, Ferrous Sulfate, 325 (65 Fe) MG TABS Take 325 mg by mouth daily. 60 tablet 3   warfarin (COUMADIN) 5 MG tablet TAKE 1/2 TO 1 TABLET BY MOUTH ONCE DAILY OR AS DIRECTED 90 tablet 1   Calcipotriene-Betameth Diprop (WYNZORA) 0.005-0.064 % CREA Apply 1 application. topically as directed. Apply twice daily for two weeks then decrease once daily for 5 days. Avoid Face, groin and underarms. (Patient not taking: Reported on 07/12/2023) 60 g 0   cyanocobalamin 1000 MCG tablet Take 1,000 mcg by mouth daily. (Patient not taking: Reported on 07/12/2023)     fluticasone (FLONASE) 50 MCG/ACT nasal spray Place 2 sprays into both nostrils daily. (Patient not taking: Reported on 07/12/2023) 16 g 6   loratadine (CLARITIN) 10 MG tablet Take 10 mg by mouth daily as needed for allergies or rhinitis. (Patient not taking:  Reported on 07/12/2023)     metoprolol tartrate (LOPRESSOR) 25 MG tablet Take 1 tablet (25 mg total) by mouth 2 (two) times daily. (Patient not taking: Reported on 07/12/2023) 180 tablet 2   mometasone (ELOCON) 0.1 % cream APPLY TOPICALLY DAILY AS NEEDED FOR RASH. APPLY UP TO FIVE DAYS PER WEEK TO THE AFFECTED AREA AS NEEDED UNTIL IMPROVED. THEN DISCONTINUE (Patient not taking: Reported on 07/12/2023) 45 g 0   Na Sulfate-K Sulfate-Mg Sulf 17.5-3.13-1.6 GM/177ML SOLN  (Patient not taking: Reported on 07/12/2023)     NON FORMULARY [employer] BRAND: STOOL SOFTNERS (Patient not taking: Reported on 07/12/2023)     Vitamin D, Ergocalciferol, (DRISDOL) 1.25 MG (50000 UNIT) CAPS capsule Take 1,250 Units by mouth every 7 (seven) days. (Patient not taking: Reported on 07/12/2023)     atorvastatin (LIPITOR) 40 MG tablet Take 1 tablet (40 mg total) by mouth daily. (Patient not taking: Reported on 07/12/2023) 90 tablet 3   meclizine (ANTIVERT) 25 MG tablet Take 25 mg by mouth 3 (three) times daily as needed. (Patient not taking: Reported on 07/12/2023)     No facility-administered medications prior to visit.     ROS Review of Systems  Constitutional:  Negative for activity change and appetite change.  HENT:  Negative for sinus pressure and sore throat.   Respiratory:  Negative for chest tightness, shortness of breath and wheezing.   Cardiovascular:  Negative for chest pain and palpitations.  Gastrointestinal:  Negative for abdominal distention, abdominal pain and constipation.  Genitourinary: Negative.   Musculoskeletal: Negative.   Psychiatric/Behavioral:  Negative for behavioral problems and dysphoric mood.     Objective:  BP 118/74   Pulse 77   Ht 5\' 5"  (1.651 m)   Wt 231 lb (104.8 kg)   LMP 04/04/2019 (Approximate)   SpO2 94%   BMI 38.44 kg/m      07/12/2023    3:51 PM 05/23/2023    8:33 AM 05/17/2023   11:41 AM  BP/Weight  Systolic BP 118 126 136  Diastolic BP 74 84 82  Wt. (Lbs) 231 230.5   BMI  38.44 kg/m2 38.36 kg/m2       Physical Exam Constitutional:      Appearance: She is well-developed.  Cardiovascular:     Rate and Rhythm: Normal rate.     Heart sounds: Normal heart sounds. No murmur heard. Pulmonary:  Effort: Pulmonary effort is normal.     Breath sounds: Normal breath sounds. No wheezing or rales.  Chest:     Chest wall: No tenderness.  Abdominal:     General: Bowel sounds are normal. There is no distension.     Palpations: Abdomen is soft. There is no mass.     Tenderness: There is no abdominal tenderness.  Musculoskeletal:        General: Normal range of motion.     Right lower leg: No edema.     Left lower leg: No edema.  Neurological:     Mental Status: She is alert and oriented to person, place, and time.  Psychiatric:        Mood and Affect: Mood normal.        Latest Ref Rng & Units 02/22/2023   12:18 PM 11/08/2021    8:40 AM 09/16/2021    2:01 PM  CMP  Glucose 70 - 99 mg/dL 161  87  95   BUN 6 - 24 mg/dL 10  16  17    Creatinine 0.57 - 1.00 mg/dL 0.96  0.45  4.09   Sodium 134 - 144 mmol/L 143  142  140   Potassium 3.5 - 5.2 mmol/L 3.6  3.6  3.1   Chloride 96 - 106 mmol/L 105  104  104   CO2 20 - 29 mmol/L  25  27   Calcium 8.7 - 10.2 mg/dL 8.7  9.0  8.8   Total Protein 6.0 - 8.5 g/dL 7.3     Total Bilirubin 0.0 - 1.2 mg/dL 0.3     Alkaline Phos 44 - 121 IU/L 110     AST 0 - 40 IU/L 19       Lipid Panel     Component Value Date/Time   CHOL 129 04/01/2021 1036   TRIG 121 04/01/2021 1036   HDL 37 (L) 04/01/2021 1036   CHOLHDL 3.5 04/01/2021 1036   CHOLHDL 2.1 09/17/2019 0856   VLDL 22 09/17/2019 0856   LDLCALC 70 04/01/2021 1036    CBC    Component Value Date/Time   WBC 4.9 02/22/2023 1218   WBC 6.9 09/06/2021 1827   RBC 4.94 02/22/2023 1218   RBC 4.96 09/06/2021 1827   HGB 11.3 02/22/2023 1218   HGB 10.0 (L) 04/17/2015 0948   HCT 35.4 02/22/2023 1218   HCT 32.1 (L) 04/17/2015 0948   PLT 280 02/22/2023 1218   MCV 72 (L)  02/22/2023 1218   MCV 66.0 (L) 04/17/2015 0948   MCH 22.9 (L) 02/22/2023 1218   MCH 23.2 (L) 09/06/2021 1827   MCHC 31.9 02/22/2023 1218   MCHC 31.5 09/06/2021 1827   RDW 14.8 02/22/2023 1218   RDW 18.2 (H) 04/17/2015 0948   LYMPHSABS 1.5 02/22/2023 1218   LYMPHSABS 1.8 04/17/2015 0948   MONOABS 0.4 03/30/2021 2248   MONOABS 0.3 04/17/2015 0948   EOSABS 0.3 02/22/2023 1218   EOSABS 0.1 10/03/2014 1004   BASOSABS 0.0 02/22/2023 1218   BASOSABS 0.1 04/17/2015 0948    Lab Results  Component Value Date   HGBA1C 6.1 07/12/2023    Assessment & Plan:      Stroke secondary to Hypercoagulable Disorder: No new neurological symptoms. INR managed at Cardiology Coumadin clinic. Emphasized the importance of lifelong anticoagulation to prevent recurrent stroke. -Continue Coumadin as directed by Coumadin clinic.  Hyperlipidemia: Inconsistent use of Atorvastatin. Discussed the importance of regular use to prevent recurrent stroke. -Resume Atorvastatin daily. -Check lipid panel  at next visit after consistent use of Atorvastatin.  Hypertension: Well controlled on current regimen. -Continue Amlodipine and Metoprolol as prescribed. -Counseled on blood pressure goal of less than 130/80, low-sodium, DASH diet, medication compliance, 150 minutes of moderate intensity exercise per week. Discussed medication compliance, adverse effects.   Psoriasis: Involving scalp and chest. Managed by Dermatology. -Continue prescribed topical treatments.  General Health Maintenance: -Encouraged increased physical activity. -Check panel and c-Met at next visit in November 2024. -Check A1C at next visit (current 6.1 without diabetes medication).           Meds ordered this encounter  Medications   atorvastatin (LIPITOR) 40 MG tablet    Sig: Take 1 tablet (40 mg total) by mouth daily.    Dispense:  90 tablet    Refill:  1    Follow-up: Return in about 4 months (around 11/12/2023) for Chronic medical  conditions.       Hoy Register, MD, FAAFP. [facility] [place], [place] [phone]   07/12/2023, 5:50 PM

## 2023-07-12 NOTE — Patient Instructions (Signed)
Exercising to Stay Healthy To become healthy and stay healthy, it is recommended that you do moderate-intensity and vigorous-intensity exercise. You can tell that you are exercising at a moderate intensity if your heart starts beating faster and you start breathing faster but can still hold a conversation. You can tell that you are exercising at a vigorous intensity if you are breathing much harder and faster and cannot hold a conversation while exercising. How can exercise benefit me? Exercising regularly is important. It has many health benefits, such as: Improving overall fitness, flexibility, and endurance. Increasing bone density. Helping with weight control. Decreasing body fat. Increasing muscle strength and endurance. Reducing stress and tension, anxiety, depression, or anger. Improving overall health. What guidelines should I follow while exercising? Before you start a new exercise program, talk with your health care provider. Do not exercise so much that you hurt yourself, feel dizzy, or get very short of breath. Wear comfortable clothes and wear shoes with good support. Drink plenty of water while you exercise to prevent dehydration or heat stroke. Work out until your breathing and your heartbeat get faster (moderate intensity). How often should I exercise? Choose an activity that you enjoy, and set realistic goals. Your health care provider can help you make an activity plan that is individually designed and works best for you. Exercise regularly as told by your health care provider. This may include: Doing strength training two times a week, such as: Lifting weights. Using resistance bands. Push-ups. Sit-ups. Yoga. Doing a certain intensity of exercise for a given amount of time. Choose from these options: A total of 150 minutes of moderate-intensity exercise every week. A total of 75 minutes of vigorous-intensity exercise every week. A mix of moderate-intensity and  vigorous-intensity exercise every week. Children, pregnant women, people who have not exercised regularly, people who are overweight, and older adults may need to talk with a health care provider about what activities are safe to perform. If you have a medical condition, be sure to talk with your health care provider before you start a new exercise program. What are some exercise ideas? Moderate-intensity exercise ideas include: Walking 1 mile (1.6 km) in about 15 minutes. Biking. Hiking. Golfing. Dancing. Water aerobics. Vigorous-intensity exercise ideas include: Walking 4.5 miles (7.2 km) or more in about 1 hour. Jogging or running 5 miles (8 km) in about 1 hour. Biking 10 miles (16.1 km) or more in about 1 hour. Lap swimming. Roller-skating or in-line skating. Cross-country skiing. Vigorous competitive sports, such as football, basketball, and soccer. Jumping rope. Aerobic dancing. What are some everyday activities that can help me get exercise? Yard work, such as: Pushing a lawn mower. Raking and bagging leaves. Washing your car. Pushing a stroller. Shoveling snow. Gardening. Washing windows or floors. How can I be more active in my day-to-day activities? Use stairs instead of an elevator. Take a walk during your lunch break. If you drive, park your car farther away from your work or school. If you take public transportation, get off one stop early and walk the rest of the way. Stand up or walk around during all of your indoor phone calls. Get up, stretch, and walk around every 30 minutes throughout the day. Enjoy exercise with a friend. Support to continue exercising will help you keep a regular routine of activity. Where to find more information You can find more information about exercising to stay healthy from: U.S. Department of Health and Human Services: www.hhs.gov Centers for Disease Control and Prevention (  CDC): www.cdc.gov Summary Exercising regularly is  important. It will improve your overall fitness, flexibility, and endurance. Regular exercise will also improve your overall health. It can help you control your weight, reduce stress, and improve your bone density. Do not exercise so much that you hurt yourself, feel dizzy, or get very short of breath. Before you start a new exercise program, talk with your health care provider. This information is not intended to replace advice given to you by your health care provider. Make sure you discuss any questions you have with your health care provider. Document Revised: 04/02/2021 Document Reviewed: 04/02/2021 Elsevier Patient Education  2024 Elsevier Inc.  

## 2023-07-19 ENCOUNTER — Ambulatory Visit: Payer: Medicare Other

## 2023-07-19 DIAGNOSIS — Z8673 Personal history of transient ischemic attack (TIA), and cerebral infarction without residual deficits: Secondary | ICD-10-CM | POA: Diagnosis not present

## 2023-07-19 DIAGNOSIS — I639 Cerebral infarction, unspecified: Secondary | ICD-10-CM | POA: Diagnosis not present

## 2023-07-19 DIAGNOSIS — Z5181 Encounter for therapeutic drug level monitoring: Secondary | ICD-10-CM | POA: Diagnosis not present

## 2023-07-19 LAB — POCT INR: INR: 3.7 — AB (ref 2.0–3.0)

## 2023-07-19 NOTE — Patient Instructions (Signed)
Continue on same dosage of Warfarin 1 tablet daily except 1/2 tablet on Mondays, Wednesdays and Fridays. EAT GREENS TONIGHT.  Recheck INR in 6 weeks.

## 2023-07-25 ENCOUNTER — Other Ambulatory Visit: Payer: Self-pay

## 2023-07-25 MED ORDER — WARFARIN SODIUM 5 MG PO TABS: ORAL_TABLET | ORAL | 1 refills | Status: DC

## 2023-08-30 ENCOUNTER — Ambulatory Visit: Payer: Medicare Other | Attending: Internal Medicine

## 2023-08-30 DIAGNOSIS — Z8673 Personal history of transient ischemic attack (TIA), and cerebral infarction without residual deficits: Secondary | ICD-10-CM

## 2023-08-30 DIAGNOSIS — Z5181 Encounter for therapeutic drug level monitoring: Secondary | ICD-10-CM | POA: Diagnosis not present

## 2023-08-30 LAB — POCT INR: INR: 2.1 (ref 2.0–3.0)

## 2023-08-30 NOTE — Patient Instructions (Signed)
TAKE 1 TABLET TODAY ONLY THEN Continue on same dosage of Warfarin 1 tablet daily except 1/2 tablet on Mondays, Wednesdays and Fridays. Recheck INR in 6 weeks.  [phone] Call if starting any new medication.

## 2023-09-20 ENCOUNTER — Other Ambulatory Visit: Payer: Self-pay | Admitting: Medical

## 2023-09-20 DIAGNOSIS — I10 Essential (primary) hypertension: Secondary | ICD-10-CM

## 2023-09-21 ENCOUNTER — Ambulatory Visit (INDEPENDENT_AMBULATORY_CARE_PROVIDER_SITE_OTHER): Payer: Medicare Other | Admitting: Dermatology

## 2023-09-21 DIAGNOSIS — Z79899 Other long term (current) drug therapy: Secondary | ICD-10-CM | POA: Diagnosis not present

## 2023-09-21 DIAGNOSIS — L409 Psoriasis, unspecified: Secondary | ICD-10-CM

## 2023-09-21 DIAGNOSIS — Z7189 Other specified counseling: Secondary | ICD-10-CM

## 2023-09-21 MED ORDER — OTEZLA 30 MG PO TABS
1.0000 | ORAL_TABLET | Freq: Two times a day (BID) | ORAL | 5 refills | Status: DC
Start: 2023-09-21 — End: 2024-03-20

## 2023-09-21 NOTE — Patient Instructions (Signed)

## 2023-09-21 NOTE — Progress Notes (Signed)
   Follow-Up Visit   Subjective  Mary [name] is a 57 y.o. female who presents for the following: Psoriasis - Otezla 30 mg po QD, pt c/o occasional diarrhea, but otherwise no s/e.  The patient is very pleased with the improvement and would like to continue [place].  The following portions of the chart were reviewed this encounter and updated as appropriate: medications, allergies, medical history  Review of Systems:  No other skin or systemic complaints except as noted in HPI or Assessment and Plan.  Objective  Well appearing patient in no apparent distress; mood and affect are within normal limits.  Areas Examined: The face, hands, and arms Mild hyperpigmentation of the inframammary areas.  Much improved. Relevant exam findings are noted in the Assessment and Plan.     Assessment & Plan   Psoriasis  Related Medications Apremilast (OTEZLA) 30 MG TABS Take 1 tablet (30 mg total) by mouth 2 (two) times daily.    PSORIASIS - inframammary  Mild hyperpigmentation of the inframammary areas much improved.. 10% BSA.  Chronic and persistent condition with duration or expected duration over one year. Condition is symptomatic/ bothersome to patient. Not currently at goal, but improved.  Treatment Plan: Patient c/o diarrhea, but it doesn't effect her enough to stop medication, and would like to continue Otezla 30 mg po QD. Side effects of Otezla (apremilast) include diarrhea, nausea, headache, upper respiratory infection, depression, and weight decrease (5-10%). It should only be taken by pregnant women after a discussion regarding risks and benefits with their doctor. Goal is control of skin condition, not cure.  The use of [place] requires long term medication management, including periodic office visits. Will continue daily Otezla 30 mg.  Will not increase at this time as patient is doing well and having only minimal side effects that are tolerable.  Counseling on psoriasis and  coordination of care  psoriasis is a chronic non-curable, but treatable genetic/hereditary disease that may have other systemic features affecting other organ systems such as joints (Psoriatic Arthritis). It is associated with an increased risk of inflammatory bowel disease, heart disease, non-alcoholic fatty liver disease, and depression.  Treatments include light and laser treatments; topical medications; and systemic medications including oral and injectables.   Return in about 6 months (around 03/21/2024) for psoriasis follow up .  Maylene Roes, CMA, am acting as scribe for Armida Sans, MD .   Documentation: I have reviewed the above documentation for accuracy and completeness, and I agree with the above.  Armida Sans, MD

## 2023-09-22 ENCOUNTER — Encounter: Payer: Self-pay | Admitting: Dermatology

## 2023-10-11 ENCOUNTER — Ambulatory Visit: Payer: Medicare Other

## 2023-10-18 ENCOUNTER — Ambulatory Visit: Payer: Medicare Other | Attending: Cardiovascular Disease

## 2023-10-18 DIAGNOSIS — Z8673 Personal history of transient ischemic attack (TIA), and cerebral infarction without residual deficits: Secondary | ICD-10-CM | POA: Diagnosis not present

## 2023-10-18 DIAGNOSIS — Z5181 Encounter for therapeutic drug level monitoring: Secondary | ICD-10-CM | POA: Diagnosis not present

## 2023-10-18 LAB — POCT INR: INR: 2.6 (ref 2.0–3.0)

## 2023-10-18 NOTE — Patient Instructions (Signed)
Continue on same dosage of Warfarin 1 tablet daily except 1/2 tablet on Mondays, Wednesdays and Fridays. Recheck INR in 6 weeks.  [phone] Call if starting any new medication.

## 2023-11-15 ENCOUNTER — Encounter: Payer: Self-pay | Admitting: Family Medicine

## 2023-11-15 ENCOUNTER — Ambulatory Visit: Payer: Medicare Other | Attending: Family Medicine | Admitting: Family Medicine

## 2023-11-15 VITALS — BP 130/74 | HR 66 | Ht 65.0 in | Wt 224.6 lb

## 2023-11-15 DIAGNOSIS — E785 Hyperlipidemia, unspecified: Secondary | ICD-10-CM

## 2023-11-15 DIAGNOSIS — M79671 Pain in right foot: Secondary | ICD-10-CM | POA: Diagnosis not present

## 2023-11-15 DIAGNOSIS — E1159 Type 2 diabetes mellitus with other circulatory complications: Secondary | ICD-10-CM

## 2023-11-15 DIAGNOSIS — Z8673 Personal history of transient ischemic attack (TIA), and cerebral infarction without residual deficits: Secondary | ICD-10-CM

## 2023-11-15 DIAGNOSIS — I10 Essential (primary) hypertension: Secondary | ICD-10-CM

## 2023-11-15 DIAGNOSIS — Z636 Dependent relative needing care at home: Secondary | ICD-10-CM

## 2023-11-15 DIAGNOSIS — D6861 Antiphospholipid syndrome: Secondary | ICD-10-CM | POA: Diagnosis not present

## 2023-11-15 DIAGNOSIS — E119 Type 2 diabetes mellitus without complications: Secondary | ICD-10-CM

## 2023-11-15 LAB — POCT GLYCOSYLATED HEMOGLOBIN (HGB A1C): HbA1c, POC (controlled diabetic range): 5.9 % (ref 0.0–7.0)

## 2023-11-15 MED ORDER — AMLODIPINE BESYLATE 10 MG PO TABS
10.0000 mg | ORAL_TABLET | Freq: Every day | ORAL | 1 refills | Status: DC
Start: 2023-11-15 — End: 2024-06-18

## 2023-11-15 MED ORDER — ATORVASTATIN CALCIUM 40 MG PO TABS: 40.0000 mg | ORAL_TABLET | Freq: Every day | ORAL | 1 refills | Status: AC

## 2023-11-15 NOTE — Patient Instructions (Signed)
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 We also reviewed your well-controlled diabetes, elevated blood pressure, cholesterol management, and occasional foot pain. Additionally, we talked about your ongoing treatment for antiphospholipid syndrome.  YOUR PLAN:  -CAREGIVER STRESS: Caregiver stress occurs when the emotional and physical strain of caregiving becomes overwhelming. We discussed the importance of self-care and recommended contacting your mother's primary care provider to explore the possibility of obtaining a home health aide to assist with her care.  -TYPE 2 DIABETES MELLITUS: Type 2 diabetes is a condition where the body does not use insulin properly, leading to high blood sugar levels. Your diabetes is well-controlled with an A1C of 5.9, and you should continue your current lifestyle modifications.  -HYPERTENSION: Hypertension, or high blood pressure, can lead to serious health issues if not managed properly. Your blood pressure was initially high but normalized on repeat measurement. Please continue taking Amlodipine and Metoprolol as prescribed and ensure you take your medications consistently, especially during stressful periods.  -HYPERLIPIDEMIA: Hyperlipidemia is a condition where there are high levels of fats (lipids) in the blood, increasing the risk of stroke and heart disease. You should restart taking Atorvastatin as prescribed for stroke prevention, and we will check your cholesterol levels today.  -ANTIPHOSPHOLIPID SYNDROME: Antiphospholipid syndrome is an autoimmune disorder that increases the risk of blood clots. Continue taking Coumadin as prescribed and ensure you regularly attend the Coumadin clinic for monitoring.  -FOOT PAIN: Your occasional foot pain may be due to arthritis, which is inflammation of the joints. You can use arthritis cream or Voltaren for pain  relief.  -GENERAL HEALTH MAINTENANCE: We will request your recent eye exam records from [facility] in [place]. Please schedule a follow-up visit in six months.  INSTRUCTIONS:  Please contact your mother's primary care provider to discuss home health aide services. Restart taking Atorvastatin as prescribed and ensure you take your blood pressure medications consistently. We will check your cholesterol levels today. Continue attending the Coumadin clinic for monitoring. Schedule a follow-up visit in six months.

## 2023-11-15 NOTE — Progress Notes (Signed)
Subjective:  Patient ID: Mary [name], female    DOB: 14-Jan-1966  Age: 57 y.o. 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 year old female with a history of type 2 diabetes mellitus (diet controlled with A1c of 5.9) antiphospholipid syndrome (on chronic anticoagulation with Coumadin, followed by the Coumadin clinic), previous stroke (with residual deficits of language, calculation and processing) hypertension.    Interval History: Discussed the use of AI scribe software for clinical note transcription with the patient, who gave verbal consent to proceed.   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 She expresses concern about her mother's condition and the emotional toll it is taking on her, leading to missed work. She works early morning shifts, from 4am to 8:30am, and her mother has expressed fear when left alone. She has not yet arranged for home care assistance, which is adding to her stress.  The patient also has a history of diabetes, which is well-controlled without medication, with a recent A1c of 5.9. However, her blood pressure was noted to be high during the visit, possibly due to stress and non-adherence to her antihypertensive medications (amlodipine and metoprolol). She also reports not taking her atorvastatin for cholesterol, despite a history of stroke.  The patient also reports occasional right foot pain, suspected to be arthritis. She is on Coumadin for antiphospholipid syndrome.      Past Medical History:  Diagnosis Date   Blood transfusion without reported diagnosis    transfusion December 2016   Clotting disorder [facility])    left leg   Diabetes mellitus without complication (HCC)    Hypertension    Other and unspecified ovarian cysts    Stroke [facility])    Vaginal Pap smear, abnormal      Past Surgical History:  Procedure Laterality Date   CLOSED REDUCTION CLAVICULAR Left 09/16/2021   Procedure: left shoulder sternoclavicular joint reduction;  Surgeon: Cammy Copa, MD;  Location: [facility] OR;  Service: Orthopedics;  Laterality: Left;   COLONOSCOPY WITH PROPOFOL N/A 11/10/2015   Procedure: COLONOSCOPY WITH PROPOFOL;  Surgeon: Midge Minium, MD;  Location: [facility] ENDOSCOPY;  Service: Endoscopy;  Laterality: N/A;   COLONOSCOPY WITH PROPOFOL N/A 01/28/2022   Procedure: COLONOSCOPY WITH PROPOFOL;  Surgeon: Toney Reil, MD;  Location: [facility] ENDOSCOPY;  Service: Gastroenterology;  Laterality: N/A;   ESOPHAGOGASTRODUODENOSCOPY (EGD) WITH PROPOFOL N/A 11/10/2015   Procedure: ESOPHAGOGASTRODUODENOSCOPY (EGD) WITH PROPOFOL;  Surgeon: Midge Minium, MD;  Location: [facility] ENDOSCOPY;  Service: Endoscopy;  Laterality: N/A;   ESOPHAGOGASTRODUODENOSCOPY (EGD) WITH PROPOFOL N/A 01/28/2022   Procedure: ESOPHAGOGASTRODUODENOSCOPY (EGD) WITH PROPOFOL;  Surgeon: Toney Reil, MD;  [facility] ENDOSCOPY;  Service: Gastroenterology;  Laterality: N/A;   LAPAROSCOPIC GASTRIC SLEEVE RESECTION  02/17/2016   placement   LOOP RECORDER IMPLANT N/A 04/25/2014   Procedure: LOOP RECORDER IMPLANT;  Surgeon: Gardiner Rhyme, MD;  Location: [facility] CATH LAB;  Service: Cardiovascular;  Laterality: N/A;   LOOP RECORDER REMOVAL N/A 02/01/2018   Procedure: LOOP RECORDER REMOVAL;  Surgeon: Duke Salvia, MD;  Location: [facility] INVASIVE CV LAB;  Service: Cardiovascular;  Laterality: N/A;   OVARIAN CYST REMOVAL     TEE WITHOUT CARDIOVERSION N/A 01/25/2013   Procedure: TRANSESOPHAGEAL ECHOCARDIOGRAM (TEE);  Surgeon: Ricki Rodriguez, MD;  Location: [facility] ENDOSCOPY;  Service: Cardiovascular;  Laterality: N/A;    Family  History  Problem Relation Age of Onset   Diabetes Mellitus II Mother    Diabetes Mother    Stroke Mother    Transient ischemic attack Father    Stroke Father    Stroke Other    Diabetes Other     Diabetes Sister     Social History   Socioeconomic History   Marital status: Single    Spouse name: Not on file   Number of children: 2   Years of education: 13   Highest education level: Some college, no degree  Occupational History   Not on file  Tobacco Use   Smoking status: Former    Current packs/day: 0.00    Average packs/day: 1 pack/day for 10.0 years (10.0 ttl pk-yrs)    Types: Cigarettes    Start date: 07/14/1996    Quit date: 07/14/2006    Years since quitting: 17.3   Smokeless tobacco: Never   Tobacco comments:    quit smoking around 2007  Vaping Use   Vaping status: Never Used  Substance and Sexual Activity   Alcohol use: No    Alcohol/week: 0.0 standard drinks of alcohol   Drug use: No   Sexual activity: Yes  Other Topics Concern   Not on file  Social History Narrative   ** Merged History Encounter **       Patient is single with two children. Patient is right handed. Patient has 13 yrs of education. Patient drinks 5 sodas daily.   Social Determinants of Health   Financial Resource Strain: High Risk (07/11/2023)   Overall Financial Resource Strain (CARDIA)    Difficulty of Paying Living Expenses: Hard  Food Insecurity: Food Insecurity Present (07/11/2023)   Hunger Vital Sign    Worried About Running Out of Food in the Last Year: Often true    Ran Out of Food in the Last Year: Never true  Transportation Needs: No Transportation Needs (07/11/2023)   PRAPARE - Administrator, Civil Service (Medical): No    Lack of Transportation (Non-Medical): No  Physical Activity: Sufficiently Active (07/11/2023)   Exercise Vital Sign    Days of Exercise per Week: 3 days    Minutes of Exercise per Session: 60 min  Recent Concern: Physical Activity - Inactive (04/20/2023)   Exercise Vital Sign    Days of Exercise per Week: 0 days    Minutes of Exercise per Session: 0 min  Stress: Stress Concern Present (07/11/2023)   [employer] of Occupational Health  - Occupational Stress Questionnaire    Feeling of Stress : Very much  Social Connections: Moderately Integrated (07/11/2023)   Social Connection and Isolation Panel [NHANES]    Frequency of Communication with Friends and Family: Three times a week    Frequency of Social Gatherings with Friends and Family: Once a week    Attends Religious Services: More than 4 times per year    Active Member of [employer] or Organizations: Yes    Attends Engineer, structural: More than 4 times per year    Marital Status: Never married    No Known Allergies  Outpatient Medications Prior to Visit  Medication Sig Dispense Refill   Apremilast (OTEZLA) 30 MG TABS Take 1 tablet (30 mg total) by mouth 2 (two) times daily. 60 tablet 5   Calcipotriene-Betameth Diprop (WYNZORA) 0.005-0.064 % CREA Apply 1 application. topically as directed. Apply twice daily for two weeks then decrease once daily for 5 days. Avoid Face, groin and underarms.  60 g 0   Cholecalciferol (VITAMIN D-3) 25 MCG (1000 UT) CAPS Take by mouth.     cyanocobalamin 1000 MCG tablet Take 1,000 mcg by mouth daily.     Iron, Ferrous Sulfate, 325 (65 Fe) MG TABS Take 325 mg by mouth daily. 60 tablet 3   loratadine (CLARITIN) 10 MG tablet Take 10 mg by mouth daily as needed for allergies or rhinitis.     metoprolol tartrate (LOPRESSOR) 25 MG tablet Take 1 tablet (25 mg total) by mouth 2 (two) times daily. 180 tablet 2   mometasone (ELOCON) 0.1 % cream APPLY TOPICALLY DAILY AS NEEDED FOR RASH. APPLY UP TO FIVE DAYS PER WEEK TO THE AFFECTED AREA AS NEEDED UNTIL IMPROVED. THEN DISCONTINUE 45 g 0   Na Sulfate-K Sulfate-Mg Sulf 17.5-3.13-1.6 GM/177ML SOLN      NON FORMULARY [employer] BRAND: STOOL SOFTNERS     Vitamin D, Ergocalciferol, (DRISDOL) 1.25 MG (50000 UNIT) CAPS capsule Take 1,250 Units by mouth every 7 (seven) days.     warfarin (COUMADIN) 5 MG tablet TAKE 1/2 TO 1 TABLET BY MOUTH ONCE DAILY OR AS DIRECTED 90 tablet 1   amLODipine (NORVASC) 10 MG  tablet Take 1 tablet by mouth once daily 90 tablet 0   atorvastatin (LIPITOR) 40 MG tablet Take 1 tablet (40 mg total) by mouth daily. 90 tablet 1   fluticasone (FLONASE) 50 MCG/ACT nasal spray Place 2 sprays into both nostrils daily. (Patient not taking: Reported on 07/12/2023) 16 g 6   No facility-administered medications prior to visit.     ROS Review of Systems  Constitutional:  Negative for activity change and appetite change.  HENT:  Negative for sinus pressure and sore throat.   Respiratory:  Negative for chest tightness, shortness of breath and wheezing.   Cardiovascular:  Negative for chest pain and palpitations.  Gastrointestinal:  Negative for abdominal distention, abdominal pain and constipation.  Genitourinary: Negative.   Musculoskeletal:        See HPI  Psychiatric/Behavioral:  Negative for behavioral problems and dysphoric mood.     Objective:  BP (!) 150/80   Pulse 66   Ht 5\' 5"  (1.651 m)   Wt 224 lb 9.6 oz (101.9 kg)   LMP 04/04/2019 (Approximate)   SpO2 100%   BMI 37.38 kg/m      11/15/2023    9:01 AM 07/12/2023    3:51 PM 05/23/2023    8:33 AM  BP/Weight  Systolic BP 150 118 126  Diastolic BP 80 74 84  Wt. (Lbs) 224.6 231 230.5  BMI 37.38 kg/m2 38.44 kg/m2 38.36 kg/m2      Physical Exam Constitutional:      Appearance: She is well-developed.  Cardiovascular:     Rate and Rhythm: Normal rate.     Heart sounds: Normal heart sounds. No murmur heard. Pulmonary:     Effort: Pulmonary effort is normal.     Breath sounds: Normal breath sounds. No wheezing or rales.  Chest:     Chest wall: No tenderness.  Abdominal:     General: Bowel sounds are normal. There is no distension.     Palpations: Abdomen is soft. There is no mass.     Tenderness: There is no abdominal tenderness.  Musculoskeletal:     Right lower leg: No edema.     Left lower leg: No edema.     Comments: No TTP of right foot  Neurological:     Mental Status: She is alert and  oriented  to person, place, and time.  Psychiatric:        Mood and Affect: Mood normal.    Diabetic Foot Exam - Simple   Simple Foot Form Diabetic Foot exam was performed with the following findings: Yes 11/15/2023  9:32 AM  Visual Inspection No deformities, no ulcerations, no other skin breakdown bilaterally: Yes Sensation Testing Intact to touch and monofilament testing bilaterally: Yes Pulse Check Posterior Tibialis and Dorsalis pulse intact bilaterally: Yes Comments        Latest Ref Rng & Units 02/22/2023   12:18 PM 11/08/2021    8:40 AM 09/16/2021    2:01 PM  CMP  Glucose 70 - 99 mg/dL 528  87  95   BUN 6 - 24 mg/dL 10  16  17    Creatinine 0.57 - 1.00 mg/dL 4.13  2.44  0.10   Sodium 134 - 144 mmol/L 143  142  140   Potassium 3.5 - 5.2 mmol/L 3.6  3.6  3.1   Chloride 96 - 106 mmol/L 105  104  104   CO2 20 - 29 mmol/L  25  27   Calcium 8.7 - 10.2 mg/dL 8.7  9.0  8.8   Total Protein 6.0 - 8.5 g/dL 7.3     Total Bilirubin 0.0 - 1.2 mg/dL 0.3     Alkaline Phos 44 - 121 IU/L 110     AST 0 - 40 IU/L 19       Lipid Panel     Component Value Date/Time   CHOL 129 04/01/2021 1036   TRIG 121 04/01/2021 1036   HDL 37 (L) 04/01/2021 1036   CHOLHDL 3.5 04/01/2021 1036   CHOLHDL 2.1 09/17/2019 0856   VLDL 22 09/17/2019 0856   LDLCALC 70 04/01/2021 1036    CBC    Component Value Date/Time   WBC 4.9 02/22/2023 1218   WBC 6.9 09/06/2021 1827   RBC 4.94 02/22/2023 1218   RBC 4.96 09/06/2021 1827   HGB 11.3 02/22/2023 1218   HGB 10.0 (L) 04/17/2015 0948   HCT 35.4 02/22/2023 1218   HCT 32.1 (L) 04/17/2015 0948   PLT 280 02/22/2023 1218   MCV 72 (L) 02/22/2023 1218   MCV 66.0 (L) 04/17/2015 0948   MCH 22.9 (L) 02/22/2023 1218   MCH 23.2 (L) 09/06/2021 1827   MCHC 31.9 02/22/2023 1218   MCHC 31.5 09/06/2021 1827   RDW 14.8 02/22/2023 1218   RDW 18.2 (H) 04/17/2015 0948   LYMPHSABS 1.5 02/22/2023 1218   LYMPHSABS 1.8 04/17/2015 0948   MONOABS 0.4 03/30/2021 2248    MONOABS 0.3 04/17/2015 0948   EOSABS 0.3 02/22/2023 1218   EOSABS 0.1 10/03/2014 1004   BASOSABS 0.0 02/22/2023 1218   BASOSABS 0.1 04/17/2015 0948    Lab Results  Component Value Date   HGBA1C 5.9 11/15/2023    Assessment & Plan:      Caregiver Stress Patient is experiencing significant stress due to caring for her mother with dementia. Discussed the importance of self-care and the possibility of obtaining a home health aide to assist with her mother's care. -Advise patient to contact her mother's primary care provider to discuss the possibility of home health aide services.  Type 2 Diabetes Mellitus Well-controlled with an A1C of 5.9. Patient is not on any diabetic medications. -Continue current lifestyle modifications. -Counseled on Diabetic diet, my plate method, 272 minutes of moderate intensity exercise/week Blood sugar logs with fasting goals of 80-120 mg/dl, random of less than 536 and  in the event of sugars less than 60 mg/dl or greater than 604 mg/dl encouraged to notify the clinic. Advised on the need for annual eye exams, annual foot exams, Pneumonia vaccine.   Hypertension Blood pressure initially elevated, but normalized on repeat measurement. Patient is on Amlodipine and Metoprolol but did not take her medications this morning. -Continue Amlodipine and Metoprolol as prescribed. -Advise patient to take her medications consistently, especially during periods of stress. -Counseled on blood pressure goal of less than 130/80, low-sodium, DASH diet, medication compliance, 150 minutes of moderate intensity exercise per week. Discussed medication compliance, adverse effects.   Hyperlipidemia Patient is not currently taking her prescribed Atorvastatin. -Advise patient to restart Atorvastatin for stroke prevention. -Check cholesterol levels today.  Antiphospholipid Syndrome Patient is on Coumadin, monitored by the Coumadin clinic. -Continue Coumadin as  prescribed. -Ensure patient is regularly attending Coumadin clinic for monitoring.  Foot Pain Patient reports occasional foot pain, possibly due to arthritis. -Advise patient to use arthritis cream or Voltaren for pain relief.  General Health Maintenance -Request recent eye exam records from [facility] in [place]. -Schedule follow-up visit in six months.          Meds ordered this encounter  Medications   amLODipine (NORVASC) 10 MG tablet    Sig: Take 1 tablet (10 mg total) by mouth daily.    Dispense:  90 tablet    Refill:  1   atorvastatin (LIPITOR) 40 MG tablet    Sig: Take 1 tablet (40 mg total) by mouth daily.    Dispense:  90 tablet    Refill:  1    Follow-up: Return in about 6 months (around 05/14/2024).       Hoy Register, MD, FAAFP. [facility] [place], [place] [phone]   11/15/2023, 9:45 AM

## 2023-11-17 ENCOUNTER — Other Ambulatory Visit: Payer: Self-pay | Admitting: Family Medicine

## 2023-11-17 LAB — CMP14+EGFR
ALT: 12 [IU]/L (ref 0–32)
AST: 16 [IU]/L (ref 0–40)
Albumin: 4.2 g/dL (ref 3.8–4.9)
Alkaline Phosphatase: 103 [IU]/L (ref 44–121)
BUN/Creatinine Ratio: 9 (ref 9–23)
BUN: 9 mg/dL (ref 6–24)
Bilirubin Total: 0.3 mg/dL (ref 0.0–1.2)
CO2: 23 mmol/L (ref 20–29)
Calcium: 9.1 mg/dL (ref 8.7–10.2)
Chloride: 103 mmol/L (ref 96–106)
Creatinine, Ser: 0.99 mg/dL (ref 0.57–1.00)
Globulin, Total: 3.6 g/dL (ref 1.5–4.5)
Glucose: 113 mg/dL — ABNORMAL HIGH (ref 70–99)
Potassium: 3 mmol/L — ABNORMAL LOW (ref 3.5–5.2)
Sodium: 144 mmol/L (ref 134–144)
Total Protein: 7.8 g/dL (ref 6.0–8.5)
eGFR: 66 mL/min/{1.73_m2} (ref >59)

## 2023-11-17 LAB — LP+NON-HDL CHOLESTEROL
Cholesterol, Total: 182 mg/dL (ref 100–199)
HDL: 43 mg/dL (ref >39)
LDL Chol Calc (NIH): 108 mg/dL — ABNORMAL HIGH (ref 0–99)
Total Non-HDL-Chol (LDL+VLDL): 139 mg/dL — ABNORMAL HIGH (ref 0–129)
Triglycerides: 177 mg/dL — ABNORMAL HIGH (ref 0–149)
VLDL Cholesterol Cal: 31 mg/dL (ref 5–40)

## 2023-11-17 LAB — MICROALBUMIN / CREATININE URINE RATIO
Creatinine, Urine: 67.1 mg/dL
Microalb/Creat Ratio: 42 mg/g{creat} — ABNORMAL HIGH (ref 0–29)
Microalbumin, Urine: 28.3 ug/mL

## 2023-11-17 MED ORDER — POTASSIUM CHLORIDE CRYS ER 10 MEQ PO TBCR: 10.0000 meq | EXTENDED_RELEASE_TABLET | Freq: Every day | ORAL | 1 refills | Status: AC

## 2023-11-22 ENCOUNTER — Encounter: Payer: Medicare Other | Admitting: Dermatology

## 2023-11-22 NOTE — Progress Notes (Signed)
This encounter was created in error - please disregard.

## 2023-11-23 ENCOUNTER — Other Ambulatory Visit: Payer: Self-pay | Admitting: Family Medicine

## 2023-11-23 DIAGNOSIS — Z1231 Encounter for screening mammogram for malignant neoplasm of breast: Secondary | ICD-10-CM

## 2023-11-29 ENCOUNTER — Ambulatory Visit: Payer: Medicare Other | Attending: Cardiovascular Disease

## 2023-11-29 ENCOUNTER — Telehealth: Payer: Self-pay | Admitting: Cardiovascular Disease

## 2023-11-29 DIAGNOSIS — Z5181 Encounter for therapeutic drug level monitoring: Secondary | ICD-10-CM

## 2023-11-29 DIAGNOSIS — Z8673 Personal history of transient ischemic attack (TIA), and cerebral infarction without residual deficits: Secondary | ICD-10-CM | POA: Diagnosis not present

## 2023-11-29 LAB — POCT INR: INR: 2.5 (ref 2.0–3.0)

## 2023-11-29 NOTE — Patient Instructions (Signed)
 Continue on same dosage of Warfarin 1 tablet daily except 1/2 tablet on Mondays, Wednesdays and Fridays. Recheck INR in 6 weeks.  [phone] Call if starting any new medication.

## 2023-11-29 NOTE — Telephone Encounter (Signed)
Pt would like a c/b regarding whether she can come in sooner that scheduled appt at 1:30 pm this afternoon. Being that she is currently on her way to [place]. Please advise

## 2023-12-19 ENCOUNTER — Ambulatory Visit
Admission: RE | Admit: 2023-12-19 | Discharge: 2023-12-19 | Disposition: A | Discharge status: Home / Self Care | Payer: Medicare Other | Source: Ambulatory Visit | Attending: Family Medicine | Admitting: Family Medicine

## 2023-12-19 DIAGNOSIS — Z1231 Encounter for screening mammogram for malignant neoplasm of breast: Secondary | ICD-10-CM | POA: Insufficient documentation

## 2024-01-04 ENCOUNTER — Other Ambulatory Visit: Payer: Self-pay | Admitting: Cardiovascular Disease

## 2024-01-04 NOTE — Telephone Encounter (Signed)
Refill request for warfarin:  Last INR was 2.5 on 11/29/23 Next INR due 01/10/24 LOV was 12/27/22  Refill approved.

## 2024-01-04 NOTE — Telephone Encounter (Signed)
Please review

## 2024-01-10 ENCOUNTER — Ambulatory Visit: Payer: Medicare Other | Attending: Cardiovascular Disease

## 2024-01-10 DIAGNOSIS — Z5181 Encounter for therapeutic drug level monitoring: Secondary | ICD-10-CM

## 2024-01-10 DIAGNOSIS — Z8673 Personal history of transient ischemic attack (TIA), and cerebral infarction without residual deficits: Secondary | ICD-10-CM

## 2024-01-10 LAB — POCT INR: INR: 1.8 — AB (ref 2.0–3.0)

## 2024-01-10 NOTE — Patient Instructions (Signed)
Take 1.5 tablets today only then Continue on same dosage of Warfarin 1 tablet daily except 1/2 tablet on Mondays, Wednesdays and Fridays. Recheck INR in 6 weeks.  [phone] Call if starting any new medication.

## 2024-01-25 DIAGNOSIS — E1159 Type 2 diabetes mellitus with other circulatory complications: Secondary | ICD-10-CM | POA: Diagnosis not present

## 2024-01-25 DIAGNOSIS — M79604 Pain in right leg: Secondary | ICD-10-CM | POA: Diagnosis not present

## 2024-01-25 DIAGNOSIS — M5431 Sciatica, right side: Secondary | ICD-10-CM | POA: Diagnosis not present

## 2024-02-21 ENCOUNTER — Ambulatory Visit: Payer: Medicare Other | Attending: Cardiovascular Disease

## 2024-02-21 DIAGNOSIS — Z8673 Personal history of transient ischemic attack (TIA), and cerebral infarction without residual deficits: Secondary | ICD-10-CM

## 2024-02-21 DIAGNOSIS — Z5181 Encounter for therapeutic drug level monitoring: Secondary | ICD-10-CM | POA: Diagnosis not present

## 2024-02-21 LAB — POCT INR: INR: 2.2 (ref 2.0–3.0)

## 2024-02-21 MED ORDER — WARFARIN SODIUM 5 MG PO TABS: ORAL_TABLET | ORAL | 1 refills | Status: DC

## 2024-02-21 NOTE — Patient Instructions (Signed)
 Take 1.5 tablets today only then Continue on same dosage of Warfarin 1 tablet daily except 1/2 tablet on Mondays, Wednesdays and Fridays. Recheck INR in 6 weeks.  [phone] Call if starting any new medication.

## 2024-02-22 ENCOUNTER — Telehealth: Payer: Self-pay | Admitting: Cardiovascular Disease

## 2024-02-22 NOTE — Telephone Encounter (Signed)
 Spoke with pt who states that the pharmacy states she is requesting a refill to early and insurance will not cover. Pt states that she will make sure she does not have medication in another bottle but if not she would like to know what to do. Advised that we do not have samples of coumadin.

## 2024-02-22 NOTE — Telephone Encounter (Signed)
 Pt c/o medication issue:  1. Name of Medication: warfarin (COUMADIN) 5 MG tablet   2. How are you currently taking this medication (dosage and times per day)?    3. Are you having a reaction (difficulty breathing--STAT)? no  4. What is your medication issue? Calling to see if our office have any samples. Insurance want cover medication until April. Please advise

## 2024-02-22 NOTE — Telephone Encounter (Signed)
 Patient following up. She found Warfarin in drawer at home. Patient thanks RN + sorry to bother--FYI.

## 2024-03-12 ENCOUNTER — Telehealth: Payer: Self-pay

## 2024-03-12 NOTE — Telephone Encounter (Signed)
 Patient came in today wanting pharmacy information for RF of [facility].   Patient out of medication, 1 sample pack given.  LOT: [id number] EXP: 07/18/2025

## 2024-03-20 ENCOUNTER — Other Ambulatory Visit: Payer: Self-pay

## 2024-03-20 DIAGNOSIS — L409 Psoriasis, unspecified: Secondary | ICD-10-CM

## 2024-03-20 MED ORDER — OTEZLA 30 MG PO TABS: 1.0000 | ORAL_TABLET | Freq: Two times a day (BID) | ORAL | 2 refills | Status: AC

## 2024-03-20 NOTE — Progress Notes (Signed)
 RF request from [facility] via phone call. aw

## 2024-04-03 ENCOUNTER — Ambulatory Visit: Attending: Cardiovascular Disease

## 2024-04-03 DIAGNOSIS — Z5181 Encounter for therapeutic drug level monitoring: Secondary | ICD-10-CM

## 2024-04-03 DIAGNOSIS — Z8673 Personal history of transient ischemic attack (TIA), and cerebral infarction without residual deficits: Secondary | ICD-10-CM | POA: Diagnosis not present

## 2024-04-03 LAB — POCT INR: INR: 2.4 (ref 2.0–3.0)

## 2024-04-03 NOTE — Patient Instructions (Signed)
 Take 1 tablet today only then Continue on same dosage of Warfarin 1 tablet daily except 1/2 tablet on Mondays, Wednesdays and Fridays. Recheck INR in 6 weeks.  [phone] Call if starting any new medication.

## 2024-04-23 ENCOUNTER — Ambulatory Visit: Payer: Medicare Other | Attending: Family Medicine

## 2024-04-23 VITALS — Ht 65.0 in | Wt 214.0 lb

## 2024-04-23 DIAGNOSIS — Z0000 Encounter for general adult medical examination without abnormal findings: Secondary | ICD-10-CM | POA: Diagnosis not present

## 2024-04-23 NOTE — Patient Instructions (Signed)
 Mary [name] , Thank you for taking time to come for your Medicare Wellness Visit. I appreciate your ongoing commitment to your health goals. Please review the following plan we discussed and let me know if I can assist you in the future.   Referrals/Orders/Follow-Ups/Clinician Recommendations: Please call [phone] to schedule a follow up visit to see Dr. Adan Holms in June 2025.  Healthcare Goals for 2025: Aim for 30 minutes of exercise or brisk walking 5 days per week.  Drink 6-8 glasses of water each day. Eat 5-6 servings of fresh vegetables and fruits per day. Continue to do brain exercises such as reading, puzzles, games on your phone to help keep the brain sharp and active.  This is a list of the screening recommended for you and due dates:  Health Maintenance  Topic Date Due   Eye exam for diabetics  Never done   Pneumococcal Vaccination (1 of 2 - PCV) Never done   Zoster (Shingles) Vaccine (1 of 2) Never done   COVID-19 Vaccine (5 - 2024-25 season) 08/20/2023   Hemoglobin A1C  05/14/2024   Flu Shot  07/19/2024   Yearly kidney function blood test for diabetes  11/14/2024   Yearly kidney health urinalysis for diabetes  11/14/2024   Complete foot exam   11/14/2024   Colon Cancer Screening  01/28/2025   Medicare Annual Wellness Visit  04/23/2025   Mammogram  12/18/2025   Pap with HPV screening  08/02/2026   DTaP/Tdap/Td vaccine (3 - Td or Tdap) 09/07/2031   Hepatitis C Screening  Completed   HIV Screening  Completed   HPV Vaccine  Aged Out   Meningitis B Vaccine  Aged Out    Advanced directives: (Declined) Advance directive discussed with you today. Even though you declined this today, please call our office should you change your mind, and we can give you the proper paperwork for you to fill out.  Next Medicare Annual Wellness Visit scheduled for next year: Yes, 04/29/2025 at 10:30 a.m PHONE visit with Nurse Health Advisor  Have you seen your provider in the last 6 months (3  months if uncontrolled diabetes)? Yes

## 2024-04-23 NOTE — Progress Notes (Cosign Needed Addendum)
 Because this visit was a virtual/telehealth visit,  certain criteria was not obtained, such a blood pressure, CBG if applicable, and timed get up and go. Any medications not marked as "taking" were not mentioned during the medication reconciliation part of the visit. Any vitals not documented were not able to be obtained due to this being a telehealth visit or patient was unable to self-report a recent blood pressure reading due to a lack of equipment at home via telehealth. Vitals that have been documented are verbally provided by the patient.   Subjective:   Mary [name] is a 58 y.o. who presents for a Medicare Wellness preventive visit.  Visit Complete: Virtual I connected with  Mary [name] on 04/23/24 by a audio enabled telemedicine application and verified that I am speaking with the correct person using two identifiers.  Patient Location: Other:  car  Provider Location: Office/Clinic  I discussed the limitations of evaluation and management by telemedicine. The patient expressed understanding and agreed to proceed.  Vital Signs: Because this visit was a virtual/telehealth visit, some criteria may be missing or patient reported. Any vitals not documented were not able to be obtained and vitals that have been documented are patient reported.  VideoDeclined- This patient declined Librarian, academic. Therefore the visit was completed with audio only.  Persons Participating in Visit: Patient.  AWV Questionnaire: Yes: Patient Medicare AWV questionnaire was completed by the patient on 04/22/2024; I have confirmed that all information answered by patient is correct and no changes since this date.  Cardiac Risk Factors include: dyslipidemia;family history of premature cardiovascular disease;hypertension;obesity (BMI >30kg/m2);sedentary lifestyle     Objective:    Today's Vitals   04/23/24 1034  Weight: 214 lb (97.1 kg)  Height: 5\' 5"  (1.651 m)  PainSc:  0-No pain   Body mass index is 35.61 kg/m.     04/23/2024   10:35 AM 04/20/2023   11:35 AM 07/08/2022    2:20 PM 05/06/2022   10:13 AM 01/28/2022    8:59 AM 09/16/2021    2:16 PM 03/30/2021   10:20 PM  Advanced Directives  Does Patient Have a Medical Advance Directive? No No No No No No No  Would patient like information on creating a medical advance directive? No - Patient declined  No - Patient declined No - Patient declined  No - Patient declined     Current Medications (verified) Outpatient Encounter Medications as of 04/23/2024  Medication Sig   amLODipine  (NORVASC ) 10 MG tablet Take 1 tablet (10 mg total) by mouth daily.   Apremilast  (OTEZLA ) 30 MG TABS Take 1 tablet (30 mg total) by mouth 2 (two) times daily.   atorvastatin  (LIPITOR) 40 MG tablet Take 1 tablet (40 mg total) by mouth daily.   Calcipotriene-Betameth Diprop (WYNZORA ) 0.005-0.064 % CREA Apply 1 application. topically as directed. Apply twice daily for two weeks then decrease once daily for 5 days. Avoid Face, groin and underarms.   Cholecalciferol (VITAMIN D -3) 25 MCG (1000 UT) CAPS Take by mouth.   cyanocobalamin 1000 MCG tablet Take 1,000 mcg by mouth daily.   fluticasone  (FLONASE ) 50 MCG/ACT nasal spray Place 2 sprays into both nostrils daily. (Patient not taking: Reported on 07/12/2023)   Iron , Ferrous Sulfate , 325 (65 Fe) MG TABS Take 325 mg by mouth daily.   loratadine  (CLARITIN ) 10 MG tablet Take 10 mg by mouth daily as needed for allergies or rhinitis.   metoprolol  tartrate (LOPRESSOR ) 25 MG tablet Take 1 tablet (  25 mg total) by mouth 2 (two) times daily.   mometasone  (ELOCON ) 0.1 % cream APPLY TOPICALLY DAILY AS NEEDED FOR RASH. APPLY UP TO FIVE DAYS PER WEEK TO THE AFFECTED AREA AS NEEDED UNTIL IMPROVED. THEN DISCONTINUE   Na Sulfate-K Sulfate-Mg Sulf 17.5-3.13-1.6 GM/177ML SOLN    NON FORMULARY [employer] BRAND: STOOL SOFTNERS   potassium chloride  (KLOR-CON  M) 10 MEQ tablet Take 1 tablet (10 mEq total) by mouth  daily.   Vitamin D , Ergocalciferol , (DRISDOL ) 1.25 MG (50000 UNIT) CAPS capsule Take 1,250 Units by mouth every 7 (seven) days.   warfarin (COUMADIN ) 5 MG tablet TAKE 1/2 TO 1 (ONE-HALF TO ONE) TABLET BY MOUTH ONCE DAILY OR  AS  DIRECTED   No facility-administered encounter medications on file as of 04/23/2024.    Allergies (verified) Patient has no known allergies.   History: Past Medical History:  Diagnosis Date   Blood transfusion without reported diagnosis    transfusion December 2016   Clotting disorder [facility])    left leg   Diabetes mellitus without complication (HCC)    Hypertension    Other and unspecified ovarian cysts    Stroke [facility])    Vaginal Pap smear, abnormal    Past Surgical History:  Procedure Laterality Date   CLOSED REDUCTION CLAVICULAR Left 09/16/2021   Procedure: left shoulder sternoclavicular joint reduction;  Surgeon: Jasmine Mesi, MD;  Location[facility] OR;  Service: Orthopedics;  Laterality: Left;   COLONOSCOPY WITH PROPOFOL  N/A 11/10/2015   Procedure: COLONOSCOPY WITH PROPOFOL ;  Surgeon: Marnee Sink, MD;  Location: [facility] ENDOSCOPY;  Service: Endoscopy;  Laterality: N/A;   COLONOSCOPY WITH PROPOFOL  N/A 01/28/2022   Procedure: COLONOSCOPY WITH PROPOFOL ;  Surgeon: Selena Daily, MD;  Location: [facility] ENDOSCOPY;  Service: Gastroenterology;  Laterality: N/A;   ESOPHAGOGASTRODUODENOSCOPY (EGD) WITH PROPOFOL  N/A 11/10/2015   Procedure: ESOPHAGOGASTRODUODENOSCOPY (EGD) WITH PROPOFOL ;  Surgeon: Marnee Sink, MD;  Location: [facility] ENDOSCOPY;  Service: Endoscopy;  Laterality: N/A;   ESOPHAGOGASTRODUODENOSCOPY (EGD) WITH PROPOFOL  N/A 01/28/2022   Procedure: ESOPHAGOGASTRODUODENOSCOPY (EGD) WITH PROPOFOL ;  Surgeon: Selena Daily, MD;  Location: [facility] ENDOSCOPY;  Service: Gastroenterology;  Laterality: N/A;   LAPAROSCOPIC GASTRIC SLEEVE RESECTION  02/17/2016   placement   LOOP RECORDER IMPLANT N/A 04/25/2014   Procedure: LOOP RECORDER IMPLANT;  Surgeon: Ellaree Gunther,  MD;  Location: [facility] CATH LAB;  Service: Cardiovascular;  Laterality: N/A;   LOOP RECORDER REMOVAL N/A 02/01/2018   Procedure: LOOP RECORDER REMOVAL;  Surgeon: Verona Goodwill, MD;  Location: [facility] INVASIVE CV LAB;  Service: Cardiovascular;  Laterality: N/A;   OVARIAN CYST REMOVAL     TEE WITHOUT CARDIOVERSION N/A 01/25/2013   Procedure: TRANSESOPHAGEAL ECHOCARDIOGRAM (TEE);  Surgeon: Darrold Emms, MD;  Location: [facility] ENDOSCOPY;  Service: Cardiovascular;  Laterality: N/A;   Family History  Problem Relation Age of Onset   Diabetes Mellitus II Mother    Diabetes Mother    Stroke Mother    Transient ischemic attack Father    Stroke Father    Stroke Other    Diabetes Other    Diabetes Sister    Social History   Socioeconomic History   Marital status: Single    Spouse name: Not on file   Number of children: 2   Years of education: 68   Highest education level: 12th grade  Occupational History   Not on file  Tobacco Use   Smoking status: Former    Current packs/day: 0.00    Average packs/day: 1 pack/day for 10.0 years (  10.0 ttl pk-yrs)    Types: Cigarettes    Start date: 07/14/1996    Quit date: 07/14/2006    Years since quitting: 17.7   Smokeless tobacco: Never   Tobacco comments:    quit smoking around 2007  Vaping Use   Vaping status: Never Used  Substance and Sexual Activity   Alcohol use: No    Alcohol/week: 0.0 standard drinks of alcohol   Drug use: No   Sexual activity: Yes  Other Topics Concern   Not on file  Social History Narrative   ** Merged History Encounter **       Patient is single with two children. Patient is right handed. Patient has 13 yrs of education. Patient drinks 5 sodas daily.   Social Drivers of Health   Financial Resource Strain: High Risk (04/23/2024)   Overall Financial Resource Strain (CARDIA)    Difficulty of Paying Living Expenses: Hard  Food Insecurity: Food Insecurity Present (04/23/2024)   Hunger Vital Sign    Worried About Running Out  of Food in the Last Year: Often true    Ran Out of Food in the Last Year: Never true  Transportation Needs: No Transportation Needs (04/23/2024)   PRAPARE - Administrator, Civil Service (Medical): No    Lack of Transportation (Non-Medical): No  Physical Activity: Insufficiently Active (04/23/2024)   Exercise Vital Sign    Days of Exercise per Week: 4 days    Minutes of Exercise per Session: 10 min  Stress: No Stress Concern Present (04/23/2024)   [employer] of Occupational Health - Occupational Stress Questionnaire    Feeling of Stress : Only a little  Social Connections: Moderately Integrated (04/23/2024)   Social Connection and Isolation Panel [NHANES]    Frequency of Communication with Friends and Family: More than three times a week    Frequency of Social Gatherings with Friends and Family: Once a week    Attends Religious Services: More than 4 times per year    Active Member of [employer] or Organizations: Yes    Attends Engineer, structural: More than 4 times per year    Marital Status: Never married    Tobacco Counseling Counseling given: Not Answered Tobacco comments: quit smoking around 2007    Clinical Intake:  Pre-visit preparation completed: Yes  Pain : No/denies pain Pain Score: 0-No pain     BMI - recorded: 35.61 Nutritional Status: BMI > 30  Obese Nutritional Risks: None Diabetes: Yes CBG done?: No Did pt. bring in CBG monitor from home?: No  Lab Results  Component Value Date   HGBA1C 5.9 11/15/2023   HGBA1C 6.1 07/12/2023   HGBA1C 6.4 (H) 02/22/2023     How often do you need to have someone help you when you read instructions, pamphlets, or other written materials from your doctor or pharmacy?: 1 - Never What is the last grade level you completed in school?: 13 years  Interpreter Needed?: No  Information entered by :: Berl Bonfanti N. Lilinoe Acklin, LPN.   Activities of Daily Living     04/23/2024   10:46 AM 04/22/2024    8:27 AM  In  your present state of health, do you have any difficulty performing the following activities:  Hearing? 0 0  Vision? 0 0  Difficulty concentrating or making decisions? 0 0  Walking or climbing stairs? 0 0  Dressing or bathing? 0 0  Doing errands, shopping? 0 0  Preparing Food and eating ? N N  Using the Toilet? N N  In the past six months, have you accidently leaked urine? N N  Do you have problems with loss of bowel control? N N  Managing your Medications? N N  Managing your Finances? N N  Housekeeping or managing your Housekeeping? N N    Patient Care Team: Joaquin Mulberry, MD as PCP - General (Family Medicine) Devorah Fonder, MD as PCP - Cardiology (Cardiology) Verona Goodwill, MD as PCP - Electrophysiology (Cardiology) Roetta Clarke, NP as PCP - Pulmonology (Nurse Practitioner) Joaquin Mulberry, MD (Family Medicine) [facility] as Consulting Physician (Optometry)  Indicate any recent Medical Services you may have received from other than [facility] providers in the past year (date may be approximate).     Assessment:   This is a routine wellness examination for Mary [name].  Hearing/Vision screen Hearing Screening - Comments:: Denies hearing difficulties. No hearing aids.  Vision Screening - Comments:: Wears rx glasses - up to date with routine eye exams with [facility]    Goals Addressed             This Visit's Progress    Patient Stated       04/23/2024: My goal is to lose 10 pounds.       Depression Screen     04/23/2024   10:35 AM 04/20/2023   11:36 AM 03/09/2023   10:44 AM 02/22/2023   11:12 AM 09/15/2021    2:50 PM 07/26/2021    2:31 PM 04/12/2021    1:59 PM  PHQ 2/9 Scores  PHQ - 2 Score 0 0  0 2 1 0  PHQ- 9 Score 0   10 8 3  0  Exception Documentation   Patient refusal        Fall Risk     04/23/2024   10:36 AM 04/22/2024    8:27 AM 11/15/2023    9:00 AM 04/20/2023   11:36 AM 03/09/2023   10:44 AM  Fall Risk   Falls in  the past year? 0 0 0 0 0  Number falls in past yr: 0 0 0 0 0  Injury with Fall? 0 0 0 0 0  Risk for fall due to : No Fall Risks  No Fall Risks Medication side effect No Fall Risks  Follow up Falls prevention discussed;Falls evaluation completed  Falls evaluation completed Falls prevention discussed;Education provided;Falls evaluation completed     MEDICARE RISK AT HOME:  Medicare Risk at Home Any stairs in or around the home?: Yes If so, are there any without handrails?: No Home free of loose throw rugs in walkways, pet beds, electrical cords, etc?: No Adequate lighting in your home to reduce risk of falls?: No Life alert?: No Use of a cane, walker or w/c?: No Grab bars in the bathroom?: No Shower chair or bench in shower?: No Elevated toilet seat or a handicapped toilet?: No  TIMED UP AND GO:  Was the test performed?  No  Cognitive Function: 6CIT completed    04/23/2024   10:35 AM 04/27/2020   11:48 AM  MMSE - Mini Mental State Exam  Not completed: Unable to complete   Orientation to time  5  Orientation to Place  5  Registration  3  Attention/ Calculation  5  Recall  2  Language- name 2 objects  2  Language- repeat  1  Language- follow 3 step command  3  Language- read & follow direction  1  Write a  sentence  1  Copy design  1  Total score  29        04/23/2024   10:45 AM 04/20/2023   11:38 AM  6CIT Screen  What Year? 0 points 0 points  What month? 0 points 0 points  What time? 0 points 0 points  Count back from 20 0 points 4 points  Months in reverse 0 points 4 points  Repeat phrase 0 points 4 points  Total Score 0 points 12 points    Immunizations Immunization History  Administered Date(s) Administered   Influenza,inj,Quad PF,6+ Mos 08/29/2019   PFIZER(Purple Top)SARS-COV-2 Vaccination 02/29/2020, 03/17/2020, 11/23/2020, 10/24/2021   PPD Test 04/12/2018   Tdap 01/30/2017, 09/06/2021    Screening Tests Health Maintenance  Topic Date Due    OPHTHALMOLOGY EXAM  Never done   Pneumococcal Vaccine 5-10 Years old (1 of 2 - PCV) Never done   Zoster Vaccines- Shingrix (1 of 2) Never done   COVID-19 Vaccine (5 - 2024-25 season) 08/20/2023   HEMOGLOBIN A1C  05/14/2024   INFLUENZA VACCINE  07/19/2024   Diabetic kidney evaluation - eGFR measurement  11/14/2024   Diabetic kidney evaluation - Urine ACR  11/14/2024   FOOT EXAM  11/14/2024   Colonoscopy  01/28/2025   Medicare Annual Wellness (AWV)  04/23/2025   MAMMOGRAM  12/18/2025   Cervical Cancer Screening (HPV/Pap Cotest)  08/02/2026   DTaP/Tdap/Td (3 - Td or Tdap) 09/07/2031   Hepatitis C Screening  Completed   HIV Screening  Completed   HPV VACCINES  Aged Out   Meningococcal B Vaccine  Aged Out    Health Maintenance  Health Maintenance Due  Topic Date Due   OPHTHALMOLOGY EXAM  Never done   Pneumococcal Vaccine 24-38 Years old (1 of 2 - PCV) Never done   Zoster Vaccines- Shingrix (1 of 2) Never done   COVID-19 Vaccine (5 - 2024-25 season) 08/20/2023   Health Maintenance Items Addressed: Yes Patient declined vaccines.  Patient is current with screenings.    Additional Screening:  Vision Screening: Recommended annual ophthalmology exams for early detection of glaucoma and other disorders of the eye.  Dental Screening: Recommended annual dental exams for proper oral hygiene  Community Resource Referral / Chronic Care Management: CRR required this visit?  No   CCM required this visit?  No     Plan:     I have personally reviewed and noted the following in the patient's chart:   Medical and social history Use of alcohol, tobacco or illicit drugs  Current medications and supplements including opioid prescriptions. Patient is not currently taking opioid prescriptions. Functional ability and status Nutritional status Physical activity Advanced directives List of other physicians Hospitalizations, surgeries, and ER visits in previous 12  months Vitals Screenings to include cognitive, depression, and falls Referrals and appointments  In addition, I have reviewed and discussed with patient certain preventive protocols, quality metrics, and best practice recommendations. A written personalized care plan for preventive services as well as general preventive health recommendations were provided to patient.     Margette Sheldon, LPN   12/24/1094   After Visit Summary: (MyChart) Due to this being a telephonic visit, the after visit summary with patients personalized plan was offered to patient via MyChart   Nurse Notes: Patient declined vaccines. Patient stated that she had eye exam done 2024. This nurse will request records from [facility].

## 2024-04-24 ENCOUNTER — Ambulatory Visit: Payer: Medicare Other | Admitting: Dermatology

## 2024-05-04 DIAGNOSIS — M47816 Spondylosis without myelopathy or radiculopathy, lumbar region: Secondary | ICD-10-CM | POA: Diagnosis not present

## 2024-05-04 DIAGNOSIS — M1611 Unilateral primary osteoarthritis, right hip: Secondary | ICD-10-CM | POA: Diagnosis not present

## 2024-05-04 DIAGNOSIS — M79604 Pain in right leg: Secondary | ICD-10-CM | POA: Diagnosis not present

## 2024-05-15 ENCOUNTER — Ambulatory Visit: Attending: Cardiovascular Disease

## 2024-05-15 DIAGNOSIS — Z5181 Encounter for therapeutic drug level monitoring: Secondary | ICD-10-CM

## 2024-05-15 DIAGNOSIS — Z8673 Personal history of transient ischemic attack (TIA), and cerebral infarction without residual deficits: Secondary | ICD-10-CM | POA: Diagnosis not present

## 2024-05-15 LAB — POCT INR: INR: 3.6 — AB (ref 2.0–3.0)

## 2024-05-15 NOTE — Patient Instructions (Signed)
 Continue on same dosage of Warfarin 1 tablet daily except 1/2 tablet on Mondays, Wednesdays and Fridays. Eat greens tonight.  Recheck INR in 6 weeks.  [phone] Call if starting any new medication.

## 2024-05-20 ENCOUNTER — Ambulatory Visit: Admitting: Dermatology

## 2024-05-20 DIAGNOSIS — Z7901 Long term (current) use of anticoagulants: Secondary | ICD-10-CM | POA: Diagnosis not present

## 2024-05-20 DIAGNOSIS — J029 Acute pharyngitis, unspecified: Secondary | ICD-10-CM | POA: Diagnosis not present

## 2024-05-20 DIAGNOSIS — Z03818 Encounter for observation for suspected exposure to other biological agents ruled out: Secondary | ICD-10-CM | POA: Diagnosis not present

## 2024-05-20 DIAGNOSIS — B9689 Other specified bacterial agents as the cause of diseases classified elsewhere: Secondary | ICD-10-CM | POA: Diagnosis not present

## 2024-05-20 DIAGNOSIS — J019 Acute sinusitis, unspecified: Secondary | ICD-10-CM | POA: Diagnosis not present

## 2024-06-16 ENCOUNTER — Other Ambulatory Visit: Payer: Self-pay | Admitting: Family Medicine

## 2024-06-16 DIAGNOSIS — E119 Type 2 diabetes mellitus without complications: Secondary | ICD-10-CM

## 2024-06-18 ENCOUNTER — Other Ambulatory Visit: Payer: Self-pay | Admitting: Medical

## 2024-06-18 DIAGNOSIS — E119 Type 2 diabetes mellitus without complications: Secondary | ICD-10-CM

## 2024-06-18 NOTE — Telephone Encounter (Signed)
 Duplicate request, refilled 06/18/24.  Requested Prescriptions  Pending Prescriptions Disp Refills   amLODipine  (NORVASC ) 10 MG tablet [Pharmacy Med Name: amLODIPine  Besylate 10 MG Oral Tablet] 90 tablet 0    Sig: Take 1 tablet by mouth once daily     Cardiovascular: Calcium  Channel Blockers 2 Passed - 06/18/2024  2:23 PM      Passed - Last BP in normal range    BP Readings from Last 1 Encounters:  11/15/23 130/74         Passed - Last Heart Rate in normal range    Pulse Readings from Last 1 Encounters:  11/15/23 66         Passed - Valid encounter within last 6 months    Recent Outpatient Visits           7 months ago Type 2 diabetes mellitus with other circulatory complication, without long-term current use of insulin  (HCC)   [facility] [place] Delbert Clam, MD   11 months ago Type 2 diabetes mellitus with other circulatory complication, without long-term current use of insulin  [facility])   [facility] [place]. [facility] Delbert Clam, MD   1 year ago Vertigo   [facility] [place]. [facility] [place], [place], [place]   2 years ago Type 2 diabetes mellitus with other circulatory complication, without long-term current use of insulin  [facility])   [facility] [facility] [place]. [facility] Delbert Clam, MD   2 years ago Type 2 diabetes mellitus with other circulatory complication, without long-term current use of insulin  [facility])   [facility] [place]. [facility] Delbert Clam, MD

## 2024-06-26 ENCOUNTER — Ambulatory Visit: Attending: Cardiovascular Disease

## 2024-06-26 DIAGNOSIS — Z8673 Personal history of transient ischemic attack (TIA), and cerebral infarction without residual deficits: Secondary | ICD-10-CM | POA: Diagnosis not present

## 2024-06-26 LAB — POCT INR: INR: 2 (ref 2.0–3.0)

## 2024-06-26 MED ORDER — WARFARIN SODIUM 5 MG PO TABS
ORAL_TABLET | ORAL | 1 refills | Status: DC
Start: 2024-06-26 — End: 2024-10-09

## 2024-06-26 NOTE — Patient Instructions (Signed)
 Description   Take 1 tablet today, then resume same dosage of Warfarin 1 tablet daily except 1/2 tablet on Mondays, Wednesdays and Fridays. Eat greens tonight.  Recheck INR in 3 weeks.  [phone] Call if starting any new medication.

## 2024-06-29 ENCOUNTER — Other Ambulatory Visit: Payer: Self-pay | Admitting: Family Medicine

## 2024-06-29 DIAGNOSIS — E119 Type 2 diabetes mellitus without complications: Secondary | ICD-10-CM

## 2024-07-17 ENCOUNTER — Ambulatory Visit: Attending: Cardiovascular Disease

## 2024-07-17 DIAGNOSIS — Z5181 Encounter for therapeutic drug level monitoring: Secondary | ICD-10-CM

## 2024-07-17 DIAGNOSIS — Z8673 Personal history of transient ischemic attack (TIA), and cerebral infarction without residual deficits: Secondary | ICD-10-CM | POA: Diagnosis not present

## 2024-07-17 LAB — POCT INR: INR: 2.4 (ref 2.0–3.0)

## 2024-07-17 NOTE — Patient Instructions (Signed)
 Take 1.5 tablets today, then resume same dosage of Warfarin 1 tablet daily except 1/2 tablet on Mondays, Wednesdays and Fridays.   Recheck INR in 4 weeks.  [phone] Call if starting any new medication.

## 2024-07-29 ENCOUNTER — Ambulatory Visit: Admitting: Nurse Practitioner

## 2024-08-02 ENCOUNTER — Encounter: Payer: Self-pay | Admitting: Oncology

## 2024-08-14 ENCOUNTER — Ambulatory Visit

## 2024-08-21 ENCOUNTER — Ambulatory Visit: Attending: Cardiovascular Disease

## 2024-08-21 DIAGNOSIS — Z8673 Personal history of transient ischemic attack (TIA), and cerebral infarction without residual deficits: Secondary | ICD-10-CM

## 2024-08-21 DIAGNOSIS — Z5181 Encounter for therapeutic drug level monitoring: Secondary | ICD-10-CM | POA: Diagnosis not present

## 2024-08-21 LAB — POCT INR: INR: 3.6 — AB (ref 2.0–3.0)

## 2024-08-21 NOTE — Patient Instructions (Signed)
 resume same dosage of Warfarin 1 tablet daily except 1/2 tablet on Mondays, Wednesdays and Fridays.   Recheck INR in 6 weeks.  [phone] Call if starting any new medication.

## 2024-08-22 ENCOUNTER — Encounter: Payer: Self-pay | Admitting: Oncology

## 2024-09-02 ENCOUNTER — Ambulatory Visit: Admitting: Nurse Practitioner

## 2024-09-13 NOTE — Progress Notes (Signed)
 Mary [name]                                          MRN: [id number]   09/13/2024   The [facility]I Quality Team Specialist reviewed this patient medical record for the purposes of chart review for care gap closure. The following were reviewed: chart review for care gap closure-diabetic eye exam.    VBCI Quality Team

## 2024-10-02 ENCOUNTER — Ambulatory Visit

## 2024-10-09 ENCOUNTER — Other Ambulatory Visit: Payer: Self-pay

## 2024-10-09 ENCOUNTER — Ambulatory Visit: Attending: Cardiovascular Disease

## 2024-10-09 DIAGNOSIS — Z5181 Encounter for therapeutic drug level monitoring: Secondary | ICD-10-CM | POA: Diagnosis not present

## 2024-10-09 DIAGNOSIS — Z8673 Personal history of transient ischemic attack (TIA), and cerebral infarction without residual deficits: Secondary | ICD-10-CM

## 2024-10-09 LAB — POCT INR: INR: 1.6 — AB (ref 2.0–3.0)

## 2024-10-09 MED ORDER — WARFARIN SODIUM 5 MG PO TABS: ORAL_TABLET | ORAL | 1 refills | Status: AC

## 2024-10-09 NOTE — Patient Instructions (Signed)
 Take 1.5 tablets today only then resume same dosage of Warfarin 1 tablet daily except 1/2 tablet on Mondays, Wednesdays and Fridays.   Recheck INR in 6 weeks.  [phone] Call if starting any new medication.

## 2024-10-21 ENCOUNTER — Other Ambulatory Visit: Payer: Self-pay | Admitting: Medical

## 2024-10-21 DIAGNOSIS — I10 Essential (primary) hypertension: Secondary | ICD-10-CM

## 2024-10-30 NOTE — Progress Notes (Signed)
 Mary [name]                                          MRN: [id number]   10/30/2024   The VBCI Quality Team Specialist reviewed this patient medical record for the purposes of chart review for care gap closure. The following were reviewed: chart review for care gap closure-glycemic status assessment.    VBCI Quality Team

## 2024-11-06 ENCOUNTER — Ambulatory Visit: Attending: Cardiovascular Disease

## 2024-11-06 DIAGNOSIS — Z8673 Personal history of transient ischemic attack (TIA), and cerebral infarction without residual deficits: Secondary | ICD-10-CM | POA: Diagnosis not present

## 2024-11-06 DIAGNOSIS — Z5181 Encounter for therapeutic drug level monitoring: Secondary | ICD-10-CM | POA: Diagnosis not present

## 2024-11-06 LAB — POCT INR: INR: 2.1 (ref 2.0–3.0)

## 2024-11-06 NOTE — Patient Instructions (Signed)
 Take 1.5 tablets today only then resume same dosage of Warfarin 1 tablet daily except 1/2 tablet on Mondays, Wednesdays and Fridays.   Recheck INR in 6 weeks.  [phone] Call if starting any new medication.

## 2024-12-15 ENCOUNTER — Other Ambulatory Visit: Payer: Self-pay | Admitting: Medical

## 2024-12-15 DIAGNOSIS — I10 Essential (primary) hypertension: Secondary | ICD-10-CM

## 2024-12-18 ENCOUNTER — Ambulatory Visit: Attending: Cardiovascular Disease

## 2024-12-18 DIAGNOSIS — Z5181 Encounter for therapeutic drug level monitoring: Secondary | ICD-10-CM

## 2024-12-18 DIAGNOSIS — Z8673 Personal history of transient ischemic attack (TIA), and cerebral infarction without residual deficits: Secondary | ICD-10-CM

## 2024-12-18 LAB — POCT INR: INR: 2.5 (ref 2.0–3.0)

## 2024-12-18 NOTE — Patient Instructions (Signed)
 Continue 1 tablet daily except 1/2 tablet on Mondays, Wednesdays and Fridays.   Recheck INR in 6 weeks.  [phone] Call if starting any new medication.

## 2024-12-26 ENCOUNTER — Other Ambulatory Visit: Payer: Self-pay | Admitting: Medical

## 2024-12-26 DIAGNOSIS — I10 Essential (primary) hypertension: Secondary | ICD-10-CM

## 2025-01-06 NOTE — Progress Notes (Signed)
 Mary [name]                                          MRN: [id number]   01/06/2025   The VBCI Quality Team Specialist reviewed this patient medical record for the purposes of chart review for care gap closure. The following were reviewed: chart review for care gap closure-glycemic status assessment.    VBCI Quality Team

## 2025-01-29 ENCOUNTER — Ambulatory Visit

## 2025-04-29 ENCOUNTER — Ambulatory Visit
